# Patient Record
Sex: Male | Born: 1935
Health system: Southern US, Community
[De-identification: ages and names within clinical notes are randomized; demographics above are authoritative.]

## PROBLEM LIST (undated history)

## (undated) DIAGNOSIS — I6529 Occlusion and stenosis of unspecified carotid artery: Secondary | ICD-10-CM

## (undated) DIAGNOSIS — R748 Abnormal levels of other serum enzymes: Secondary | ICD-10-CM

## (undated) DIAGNOSIS — K222 Esophageal obstruction: Secondary | ICD-10-CM

## (undated) DIAGNOSIS — K5730 Diverticulosis of large intestine without perforation or abscess without bleeding: Secondary | ICD-10-CM

## (undated) DIAGNOSIS — M545 Low back pain, unspecified: Secondary | ICD-10-CM

## (undated) DIAGNOSIS — N400 Enlarged prostate without lower urinary tract symptoms: Secondary | ICD-10-CM

## (undated) DIAGNOSIS — K219 Gastro-esophageal reflux disease without esophagitis: Secondary | ICD-10-CM

## (undated) DIAGNOSIS — T827XXA Infection and inflammatory reaction due to other cardiac and vascular devices, implants and grafts, initial encounter: Secondary | ICD-10-CM

## (undated) DIAGNOSIS — E785 Hyperlipidemia, unspecified: Secondary | ICD-10-CM

## (undated) DIAGNOSIS — H906 Mixed conductive and sensorineural hearing loss, bilateral: Secondary | ICD-10-CM

## (undated) DIAGNOSIS — I2510 Atherosclerotic heart disease of native coronary artery without angina pectoris: Secondary | ICD-10-CM

## (undated) DIAGNOSIS — M1990 Unspecified osteoarthritis, unspecified site: Secondary | ICD-10-CM

## (undated) DIAGNOSIS — Z950 Presence of cardiac pacemaker: Secondary | ICD-10-CM

## (undated) DIAGNOSIS — M353 Polymyalgia rheumatica: Secondary | ICD-10-CM

## (undated) DIAGNOSIS — K449 Diaphragmatic hernia without obstruction or gangrene: Secondary | ICD-10-CM

## (undated) DIAGNOSIS — I509 Heart failure, unspecified: Secondary | ICD-10-CM

## (undated) DIAGNOSIS — R972 Elevated prostate specific antigen [PSA]: Secondary | ICD-10-CM

## (undated) DIAGNOSIS — N63 Unspecified lump in unspecified breast: Secondary | ICD-10-CM

## (undated) DIAGNOSIS — I4891 Unspecified atrial fibrillation: Secondary | ICD-10-CM

## (undated) DIAGNOSIS — Z9289 Personal history of other medical treatment: Secondary | ICD-10-CM

## (undated) DIAGNOSIS — I495 Sick sinus syndrome: Secondary | ICD-10-CM

## (undated) DIAGNOSIS — K648 Other hemorrhoids: Secondary | ICD-10-CM

## (undated) HISTORY — DX: Sick sinus syndrome: I49.5

## (undated) HISTORY — DX: Low back pain: M54.5

## (undated) HISTORY — PX: CARDIAC CATHETERIZATION: SHX172

## (undated) HISTORY — DX: Gastro-esophageal reflux disease without esophagitis: K21.9

## (undated) HISTORY — DX: Low back pain, unspecified: M54.50

## (undated) HISTORY — DX: Hyperlipidemia, unspecified: E78.5

## (undated) HISTORY — PX: UMBILICAL HERNIA REPAIR: SHX196

## (undated) HISTORY — PX: HEMORRHOID BANDING: SHX5850

## (undated) HISTORY — PX: INGUINAL HERNIA REPAIR: SUR1180

## (undated) HISTORY — PX: CATARACT EXTRACTION, BILATERAL: SHX1313

## (undated) HISTORY — DX: Diverticulosis of large intestine without perforation or abscess without bleeding: K57.30

## (undated) HISTORY — DX: Benign prostatic hyperplasia without lower urinary tract symptoms: N40.0

## (undated) HISTORY — DX: Occlusion and stenosis of unspecified carotid artery: I65.29

## (undated) HISTORY — DX: Abnormal levels of other serum enzymes: R74.8

## (undated) HISTORY — PX: PARACENTESIS: SHX844

## (undated) HISTORY — DX: Unspecified lump in unspecified breast: N63.0

## (undated) HISTORY — DX: Diaphragmatic hernia without obstruction or gangrene: K44.9

## (undated) HISTORY — DX: Personal history of other medical treatment: Z92.89

## (undated) HISTORY — DX: Atherosclerotic heart disease of native coronary artery without angina pectoris: I25.10

## (undated) HISTORY — DX: Other hemorrhoids: K64.8

## (undated) HISTORY — DX: Esophageal obstruction: K22.2

## (undated) HISTORY — PX: INSERT / REPLACE / REMOVE PACEMAKER: SUR710

## (undated) HISTORY — DX: Heart failure, unspecified: I50.9

## (undated) HISTORY — DX: Elevated prostate specific antigen (PSA): R97.20

## (undated) HISTORY — DX: Mixed conductive and sensorineural hearing loss, bilateral: H90.6

## (undated) HISTORY — PX: CAROTID ENDARTERECTOMY: SUR193

## (undated) HISTORY — PX: ESOPHAGOGASTRODUODENOSCOPY: SHX1529

## (undated) HISTORY — DX: Polymyalgia rheumatica: M35.3

## (undated) HISTORY — DX: Unspecified atrial fibrillation: I48.91

## (undated) SURGERY — CARDIOVERSION
Anesthesia: Monitor Anesthesia Care

---
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
## 1997-06-19 ENCOUNTER — Ambulatory Visit (HOSPITAL_COMMUNITY): Admission: RE | Admit: 1997-06-19 | Discharge: 1997-06-19 | Payer: Self-pay | Admitting: Ophthalmology

## 1997-10-01 ENCOUNTER — Inpatient Hospital Stay (HOSPITAL_COMMUNITY): Admission: EM | Admit: 1997-10-01 | Discharge: 1997-10-04 | Payer: Self-pay | Admitting: Emergency Medicine

## 1999-01-28 ENCOUNTER — Encounter: Payer: Self-pay | Admitting: Gastroenterology

## 1999-01-28 ENCOUNTER — Ambulatory Visit (HOSPITAL_COMMUNITY): Admission: RE | Admit: 1999-01-28 | Discharge: 1999-01-28 | Payer: Self-pay | Admitting: Gastroenterology

## 1999-12-13 ENCOUNTER — Inpatient Hospital Stay (HOSPITAL_COMMUNITY): Admission: EM | Admit: 1999-12-13 | Discharge: 1999-12-16 | Payer: Self-pay | Admitting: Emergency Medicine

## 1999-12-13 ENCOUNTER — Encounter: Payer: Self-pay | Admitting: Cardiology

## 1999-12-14 ENCOUNTER — Encounter: Payer: Self-pay | Admitting: Cardiology

## 2002-08-31 ENCOUNTER — Encounter: Payer: Self-pay | Admitting: Emergency Medicine

## 2002-08-31 ENCOUNTER — Inpatient Hospital Stay (HOSPITAL_COMMUNITY): Admission: EM | Admit: 2002-08-31 | Discharge: 2002-09-03 | Payer: Self-pay | Admitting: Emergency Medicine

## 2003-01-21 ENCOUNTER — Inpatient Hospital Stay (HOSPITAL_COMMUNITY): Admission: EM | Admit: 2003-01-21 | Discharge: 2003-01-23 | Payer: Self-pay | Admitting: *Deleted

## 2003-04-11 ENCOUNTER — Inpatient Hospital Stay (HOSPITAL_COMMUNITY): Admission: AD | Admit: 2003-04-11 | Discharge: 2003-04-14 | Payer: Self-pay | Admitting: Cardiology

## 2003-08-13 ENCOUNTER — Ambulatory Visit (HOSPITAL_COMMUNITY): Admission: RE | Admit: 2003-08-13 | Discharge: 2003-08-13 | Payer: Self-pay | Admitting: Cardiology

## 2003-10-30 ENCOUNTER — Ambulatory Visit (HOSPITAL_COMMUNITY): Admission: RE | Admit: 2003-10-30 | Discharge: 2003-10-30 | Payer: Self-pay | Admitting: Ophthalmology

## 2004-01-29 ENCOUNTER — Ambulatory Visit: Payer: Self-pay | Admitting: Cardiology

## 2004-02-18 ENCOUNTER — Ambulatory Visit: Payer: Self-pay | Admitting: Cardiology

## 2004-02-26 ENCOUNTER — Ambulatory Visit: Payer: Self-pay | Admitting: Cardiology

## 2004-03-13 ENCOUNTER — Ambulatory Visit: Payer: Self-pay | Admitting: Cardiology

## 2004-03-25 ENCOUNTER — Ambulatory Visit: Payer: Self-pay | Admitting: Internal Medicine

## 2004-03-29 ENCOUNTER — Ambulatory Visit: Payer: Self-pay | Admitting: Cardiology

## 2004-04-05 ENCOUNTER — Ambulatory Visit: Payer: Self-pay | Admitting: Internal Medicine

## 2004-04-22 ENCOUNTER — Ambulatory Visit: Payer: Self-pay | Admitting: Cardiology

## 2004-04-29 ENCOUNTER — Ambulatory Visit: Payer: Self-pay | Admitting: Cardiology

## 2004-05-20 ENCOUNTER — Ambulatory Visit: Payer: Self-pay | Admitting: Internal Medicine

## 2004-06-02 ENCOUNTER — Ambulatory Visit: Payer: Self-pay | Admitting: Cardiology

## 2004-06-17 ENCOUNTER — Ambulatory Visit: Payer: Self-pay | Admitting: Cardiology

## 2004-07-02 ENCOUNTER — Ambulatory Visit: Payer: Self-pay | Admitting: Cardiology

## 2004-08-11 ENCOUNTER — Ambulatory Visit: Payer: Self-pay | Admitting: Cardiology

## 2004-08-15 ENCOUNTER — Ambulatory Visit: Payer: Self-pay | Admitting: Cardiology

## 2004-08-25 ENCOUNTER — Ambulatory Visit: Payer: Self-pay

## 2004-09-11 ENCOUNTER — Ambulatory Visit: Payer: Self-pay | Admitting: Cardiology

## 2004-09-11 ENCOUNTER — Ambulatory Visit: Payer: Self-pay | Admitting: Internal Medicine

## 2004-09-29 ENCOUNTER — Ambulatory Visit: Payer: Self-pay | Admitting: Cardiology

## 2004-09-30 ENCOUNTER — Ambulatory Visit: Payer: Self-pay

## 2004-09-30 ENCOUNTER — Ambulatory Visit: Payer: Self-pay | Admitting: Cardiology

## 2004-10-07 ENCOUNTER — Ambulatory Visit: Payer: Self-pay | Admitting: Cardiology

## 2004-10-09 ENCOUNTER — Ambulatory Visit: Payer: Self-pay | Admitting: Cardiology

## 2004-10-27 ENCOUNTER — Ambulatory Visit: Payer: Self-pay | Admitting: Cardiology

## 2004-11-11 ENCOUNTER — Ambulatory Visit: Payer: Self-pay | Admitting: Cardiology

## 2004-11-14 ENCOUNTER — Ambulatory Visit: Payer: Self-pay | Admitting: Cardiology

## 2004-11-17 ENCOUNTER — Ambulatory Visit: Payer: Self-pay | Admitting: Cardiology

## 2004-11-18 ENCOUNTER — Ambulatory Visit: Payer: Self-pay | Admitting: Cardiology

## 2004-11-18 ENCOUNTER — Ambulatory Visit (HOSPITAL_COMMUNITY): Admission: RE | Admit: 2004-11-18 | Discharge: 2004-11-18 | Payer: Self-pay | Admitting: Cardiology

## 2004-11-25 ENCOUNTER — Ambulatory Visit: Payer: Self-pay | Admitting: Cardiology

## 2004-12-09 ENCOUNTER — Ambulatory Visit: Payer: Self-pay | Admitting: *Deleted

## 2004-12-16 ENCOUNTER — Ambulatory Visit: Payer: Self-pay | Admitting: Cardiology

## 2004-12-26 ENCOUNTER — Ambulatory Visit: Payer: Self-pay | Admitting: Cardiovascular Disease

## 2004-12-29 ENCOUNTER — Inpatient Hospital Stay (HOSPITAL_COMMUNITY): Admission: EM | Admit: 2004-12-29 | Discharge: 2005-01-01 | Payer: Self-pay | Admitting: Emergency Medicine

## 2004-12-30 ENCOUNTER — Ambulatory Visit: Payer: Self-pay | Admitting: Cardiovascular Disease

## 2005-01-06 ENCOUNTER — Ambulatory Visit: Payer: Self-pay | Admitting: Cardiology

## 2005-01-09 ENCOUNTER — Ambulatory Visit: Payer: Self-pay | Admitting: Cardiology

## 2005-01-12 ENCOUNTER — Ambulatory Visit: Payer: Self-pay | Admitting: Internal Medicine

## 2005-01-12 ENCOUNTER — Observation Stay (HOSPITAL_COMMUNITY): Admission: EM | Admit: 2005-01-12 | Discharge: 2005-01-13 | Payer: Self-pay | Admitting: Internal Medicine

## 2005-01-13 ENCOUNTER — Encounter: Payer: Self-pay | Admitting: Gastroenterology

## 2005-01-19 ENCOUNTER — Ambulatory Visit: Payer: Self-pay | Admitting: Cardiology

## 2005-01-20 ENCOUNTER — Ambulatory Visit: Payer: Self-pay | Admitting: Cardiology

## 2005-01-23 ENCOUNTER — Ambulatory Visit: Payer: Self-pay | Admitting: Internal Medicine

## 2005-01-27 ENCOUNTER — Ambulatory Visit: Payer: Self-pay | Admitting: Cardiology

## 2005-01-27 ENCOUNTER — Ambulatory Visit: Payer: Self-pay

## 2005-02-05 ENCOUNTER — Ambulatory Visit: Payer: Self-pay | Admitting: Cardiology

## 2005-02-12 ENCOUNTER — Ambulatory Visit: Payer: Self-pay | Admitting: Cardiology

## 2005-02-18 ENCOUNTER — Ambulatory Visit: Payer: Self-pay | Admitting: Cardiology

## 2005-02-25 ENCOUNTER — Ambulatory Visit: Payer: Self-pay | Admitting: Cardiology

## 2005-02-26 ENCOUNTER — Ambulatory Visit: Payer: Self-pay | Admitting: Cardiology

## 2005-03-19 ENCOUNTER — Ambulatory Visit: Payer: Self-pay | Admitting: Cardiology

## 2005-03-19 ENCOUNTER — Ambulatory Visit: Payer: Self-pay | Admitting: Internal Medicine

## 2005-04-08 ENCOUNTER — Ambulatory Visit: Payer: Self-pay | Admitting: Cardiology

## 2005-04-14 ENCOUNTER — Ambulatory Visit (HOSPITAL_COMMUNITY): Admission: RE | Admit: 2005-04-14 | Discharge: 2005-04-14 | Payer: Self-pay | Admitting: Cardiology

## 2005-04-14 ENCOUNTER — Ambulatory Visit: Payer: Self-pay | Admitting: Cardiology

## 2005-04-20 ENCOUNTER — Ambulatory Visit: Payer: Self-pay | Admitting: Internal Medicine

## 2005-04-27 ENCOUNTER — Ambulatory Visit: Payer: Self-pay

## 2005-06-02 ENCOUNTER — Ambulatory Visit: Payer: Self-pay | Admitting: Cardiology

## 2005-06-04 ENCOUNTER — Ambulatory Visit: Payer: Self-pay | Admitting: Cardiology

## 2005-06-22 ENCOUNTER — Ambulatory Visit: Payer: Self-pay | Admitting: Cardiology

## 2005-06-25 ENCOUNTER — Ambulatory Visit: Payer: Self-pay | Admitting: Internal Medicine

## 2005-06-29 ENCOUNTER — Encounter: Admission: RE | Admit: 2005-06-29 | Discharge: 2005-07-01 | Payer: Self-pay | Admitting: Internal Medicine

## 2005-06-30 ENCOUNTER — Ambulatory Visit: Payer: Self-pay | Admitting: Cardiology

## 2005-07-21 ENCOUNTER — Ambulatory Visit: Payer: Self-pay | Admitting: Cardiology

## 2005-07-31 ENCOUNTER — Ambulatory Visit: Payer: Self-pay | Admitting: Cardiology

## 2005-08-11 ENCOUNTER — Ambulatory Visit: Payer: Self-pay | Admitting: Internal Medicine

## 2005-08-25 ENCOUNTER — Ambulatory Visit: Payer: Self-pay | Admitting: Cardiology

## 2005-09-01 ENCOUNTER — Ambulatory Visit: Payer: Self-pay | Admitting: Cardiology

## 2005-09-07 ENCOUNTER — Ambulatory Visit: Payer: Self-pay | Admitting: Cardiology

## 2005-09-07 ENCOUNTER — Ambulatory Visit: Payer: Self-pay | Admitting: Cardiovascular Disease

## 2005-09-24 ENCOUNTER — Ambulatory Visit: Payer: Self-pay | Admitting: Cardiology

## 2005-09-29 ENCOUNTER — Ambulatory Visit: Payer: Self-pay | Admitting: Cardiology

## 2005-10-08 ENCOUNTER — Ambulatory Visit: Payer: Self-pay | Admitting: Cardiology

## 2005-10-22 ENCOUNTER — Ambulatory Visit: Payer: Self-pay | Admitting: Cardiology

## 2005-11-03 ENCOUNTER — Ambulatory Visit: Payer: Self-pay | Admitting: Cardiovascular Disease

## 2005-11-13 ENCOUNTER — Ambulatory Visit: Payer: Self-pay | Admitting: Cardiology

## 2005-12-10 ENCOUNTER — Ambulatory Visit: Payer: Self-pay | Admitting: Cardiology

## 2005-12-24 ENCOUNTER — Ambulatory Visit: Payer: Self-pay | Admitting: Cardiology

## 2006-01-05 ENCOUNTER — Ambulatory Visit: Payer: Self-pay | Admitting: Cardiology

## 2006-01-05 ENCOUNTER — Ambulatory Visit: Payer: Self-pay | Admitting: Internal Medicine

## 2006-01-06 ENCOUNTER — Ambulatory Visit: Payer: Self-pay | Admitting: Internal Medicine

## 2006-01-06 ENCOUNTER — Ambulatory Visit (HOSPITAL_COMMUNITY): Admission: RE | Admit: 2006-01-06 | Discharge: 2006-01-06 | Payer: Self-pay | Admitting: Internal Medicine

## 2006-01-20 ENCOUNTER — Ambulatory Visit: Payer: Self-pay | Admitting: Cardiology

## 2006-01-27 ENCOUNTER — Ambulatory Visit: Payer: Self-pay

## 2006-01-27 ENCOUNTER — Ambulatory Visit: Payer: Self-pay | Admitting: Cardiology

## 2006-01-27 ENCOUNTER — Encounter: Payer: Self-pay | Admitting: Cardiology

## 2006-02-18 ENCOUNTER — Ambulatory Visit: Payer: Self-pay | Admitting: Cardiology

## 2006-03-04 ENCOUNTER — Ambulatory Visit: Payer: Self-pay | Admitting: Cardiology

## 2006-03-08 ENCOUNTER — Ambulatory Visit: Payer: Self-pay | Admitting: Cardiology

## 2006-03-22 ENCOUNTER — Ambulatory Visit: Payer: Self-pay | Admitting: Cardiology

## 2006-04-09 ENCOUNTER — Encounter: Admission: RE | Admit: 2006-04-09 | Discharge: 2006-04-09 | Payer: Self-pay | Admitting: Internal Medicine

## 2006-04-09 ENCOUNTER — Ambulatory Visit: Payer: Self-pay | Admitting: Internal Medicine

## 2006-04-13 ENCOUNTER — Ambulatory Visit: Payer: Self-pay | Admitting: Cardiology

## 2006-04-22 ENCOUNTER — Ambulatory Visit: Payer: Self-pay | Admitting: Cardiology

## 2006-04-22 ENCOUNTER — Ambulatory Visit: Payer: Self-pay | Admitting: Internal Medicine

## 2006-04-22 ENCOUNTER — Encounter: Payer: Self-pay | Admitting: Internal Medicine

## 2006-04-26 ENCOUNTER — Encounter: Payer: Self-pay | Admitting: Cardiology

## 2006-04-26 ENCOUNTER — Ambulatory Visit (HOSPITAL_COMMUNITY): Admission: RE | Admit: 2006-04-26 | Discharge: 2006-04-26 | Payer: Self-pay | Admitting: Cardiology

## 2006-05-11 ENCOUNTER — Ambulatory Visit: Payer: Self-pay | Admitting: Internal Medicine

## 2006-05-25 ENCOUNTER — Ambulatory Visit: Payer: Self-pay | Admitting: Cardiology

## 2006-06-03 ENCOUNTER — Ambulatory Visit: Payer: Self-pay | Admitting: Cardiology

## 2006-06-07 ENCOUNTER — Ambulatory Visit: Payer: Self-pay | Admitting: Cardiovascular Disease

## 2006-06-09 ENCOUNTER — Inpatient Hospital Stay (HOSPITAL_COMMUNITY): Admission: AD | Admit: 2006-06-09 | Discharge: 2006-06-13 | Payer: Self-pay | Admitting: Internal Medicine

## 2006-06-09 ENCOUNTER — Ambulatory Visit: Payer: Self-pay | Admitting: Internal Medicine

## 2006-06-22 ENCOUNTER — Ambulatory Visit: Payer: Self-pay | Admitting: Internal Medicine

## 2006-07-01 ENCOUNTER — Ambulatory Visit: Payer: Self-pay | Admitting: Cardiology

## 2006-07-29 ENCOUNTER — Ambulatory Visit: Payer: Self-pay | Admitting: Internal Medicine

## 2006-08-11 ENCOUNTER — Ambulatory Visit: Payer: Self-pay | Admitting: Internal Medicine

## 2006-08-26 ENCOUNTER — Ambulatory Visit: Payer: Self-pay | Admitting: Cardiology

## 2006-08-26 LAB — CONVERTED CEMR LAB
Albumin: 3.7 g/dL (ref 3.5–5.2)
HDL: 30.5 mg/dL — ABNORMAL LOW (ref >39.0)
Total CHOL/HDL Ratio: 4.5
Triglycerides: 118 mg/dL (ref 0–149)

## 2006-09-02 ENCOUNTER — Ambulatory Visit: Payer: Self-pay | Admitting: Cardiology

## 2006-10-05 ENCOUNTER — Ambulatory Visit: Payer: Self-pay | Admitting: Cardiology

## 2006-11-18 ENCOUNTER — Ambulatory Visit: Payer: Self-pay | Admitting: Cardiology

## 2006-11-29 ENCOUNTER — Ambulatory Visit: Payer: Self-pay | Admitting: Internal Medicine

## 2006-11-29 DIAGNOSIS — N63 Unspecified lump in unspecified breast: Secondary | ICD-10-CM | POA: Insufficient documentation

## 2006-12-02 ENCOUNTER — Encounter: Payer: Self-pay | Admitting: Internal Medicine

## 2006-12-13 ENCOUNTER — Ambulatory Visit: Payer: Self-pay | Admitting: Cardiology

## 2006-12-13 ENCOUNTER — Ambulatory Visit: Payer: Self-pay | Admitting: Internal Medicine

## 2006-12-17 ENCOUNTER — Telehealth (INDEPENDENT_AMBULATORY_CARE_PROVIDER_SITE_OTHER): Payer: Self-pay | Admitting: *Deleted

## 2007-01-04 ENCOUNTER — Telehealth: Payer: Self-pay | Admitting: Internal Medicine

## 2007-01-07 ENCOUNTER — Ambulatory Visit: Payer: Self-pay | Admitting: Internal Medicine

## 2007-01-11 ENCOUNTER — Ambulatory Visit: Payer: Self-pay | Admitting: Cardiology

## 2007-01-14 ENCOUNTER — Telehealth: Payer: Self-pay | Admitting: Internal Medicine

## 2007-02-08 ENCOUNTER — Ambulatory Visit: Payer: Self-pay | Admitting: Cardiology

## 2007-02-09 ENCOUNTER — Telehealth: Payer: Self-pay | Admitting: Internal Medicine

## 2007-02-14 ENCOUNTER — Telehealth: Payer: Self-pay | Admitting: Internal Medicine

## 2007-03-01 ENCOUNTER — Ambulatory Visit: Payer: Self-pay | Admitting: Cardiology

## 2007-03-15 ENCOUNTER — Ambulatory Visit: Payer: Self-pay | Admitting: Internal Medicine

## 2007-03-15 DIAGNOSIS — M545 Low back pain: Secondary | ICD-10-CM

## 2007-03-16 DIAGNOSIS — K219 Gastro-esophageal reflux disease without esophagitis: Secondary | ICD-10-CM

## 2007-03-16 DIAGNOSIS — N400 Enlarged prostate without lower urinary tract symptoms: Secondary | ICD-10-CM

## 2007-03-16 DIAGNOSIS — E785 Hyperlipidemia, unspecified: Secondary | ICD-10-CM | POA: Insufficient documentation

## 2007-03-16 DIAGNOSIS — I4891 Unspecified atrial fibrillation: Secondary | ICD-10-CM

## 2007-03-31 ENCOUNTER — Ambulatory Visit: Payer: Self-pay | Admitting: Cardiology

## 2007-04-11 ENCOUNTER — Encounter: Payer: Self-pay | Admitting: Internal Medicine

## 2007-04-28 ENCOUNTER — Ambulatory Visit: Payer: Self-pay | Admitting: Cardiology

## 2007-05-18 ENCOUNTER — Ambulatory Visit: Payer: Self-pay | Admitting: Cardiology

## 2007-05-25 ENCOUNTER — Ambulatory Visit: Payer: Self-pay | Admitting: Cardiology

## 2007-06-13 ENCOUNTER — Ambulatory Visit: Payer: Self-pay | Admitting: Internal Medicine

## 2007-06-28 ENCOUNTER — Ambulatory Visit: Payer: Self-pay | Admitting: Cardiology

## 2007-06-28 LAB — CONVERTED CEMR LAB
Albumin: 3.7 g/dL (ref 3.5–5.2)
BUN: 19 mg/dL (ref 6–23)
Cholesterol: 134 mg/dL (ref 0–200)
Creatinine, Ser: 0.8 mg/dL (ref 0.4–1.5)
GFR calc Af Amer: 121 mL/min
GFR calc non Af Amer: 100 mL/min
LDL Cholesterol: 83 mg/dL (ref 0–99)
Potassium: 4 meq/L (ref 3.5–5.1)
Triglycerides: 96 mg/dL (ref 0–149)
VLDL: 19 mg/dL (ref 0–40)

## 2007-07-04 ENCOUNTER — Ambulatory Visit: Payer: Self-pay | Admitting: Cardiovascular Disease

## 2007-07-06 ENCOUNTER — Telehealth: Payer: Self-pay | Admitting: Internal Medicine

## 2007-08-08 ENCOUNTER — Ambulatory Visit: Payer: Self-pay | Admitting: Cardiovascular Disease

## 2007-08-22 ENCOUNTER — Telehealth: Payer: Self-pay | Admitting: Internal Medicine

## 2007-08-30 ENCOUNTER — Ambulatory Visit: Payer: Self-pay | Admitting: Internal Medicine

## 2007-09-05 ENCOUNTER — Ambulatory Visit: Payer: Self-pay | Admitting: Cardiology

## 2007-09-15 ENCOUNTER — Ambulatory Visit: Payer: Self-pay | Admitting: Cardiology

## 2007-10-21 ENCOUNTER — Ambulatory Visit: Payer: Self-pay | Admitting: Cardiology

## 2007-10-27 ENCOUNTER — Ambulatory Visit: Payer: Self-pay | Admitting: Cardiology

## 2007-11-11 ENCOUNTER — Ambulatory Visit: Payer: Self-pay | Admitting: Cardiovascular Disease

## 2007-11-29 ENCOUNTER — Ambulatory Visit: Payer: Self-pay | Admitting: Internal Medicine

## 2007-12-08 ENCOUNTER — Ambulatory Visit: Payer: Self-pay | Admitting: Internal Medicine

## 2008-01-05 ENCOUNTER — Ambulatory Visit: Payer: Self-pay | Admitting: Internal Medicine

## 2008-01-10 ENCOUNTER — Ambulatory Visit: Payer: Self-pay | Admitting: Internal Medicine

## 2008-01-10 DIAGNOSIS — R972 Elevated prostate specific antigen [PSA]: Secondary | ICD-10-CM | POA: Insufficient documentation

## 2008-01-10 DIAGNOSIS — H906 Mixed conductive and sensorineural hearing loss, bilateral: Secondary | ICD-10-CM | POA: Insufficient documentation

## 2008-01-16 ENCOUNTER — Ambulatory Visit: Payer: Self-pay | Admitting: Cardiology

## 2008-01-16 ENCOUNTER — Ambulatory Visit: Payer: Self-pay | Admitting: Internal Medicine

## 2008-01-16 LAB — CONVERTED CEMR LAB
Albumin: 3.7 g/dL (ref 3.5–5.2)
Alkaline Phosphatase: 49 units/L (ref 39–117)
BUN: 21 mg/dL (ref 6–23)
Cholesterol: 116 mg/dL (ref 0–200)
GFR calc non Af Amer: 100 mL/min
Glucose, Bld: 97 mg/dL (ref 70–99)
HDL: 26 mg/dL — ABNORMAL LOW (ref >39.0)
Total Bilirubin: 1 mg/dL (ref 0.3–1.2)
Triglycerides: 81 mg/dL (ref 0–149)
VLDL: 16 mg/dL (ref 0–40)

## 2008-01-18 ENCOUNTER — Encounter: Payer: Self-pay | Admitting: Internal Medicine

## 2008-01-18 ENCOUNTER — Encounter: Admission: RE | Admit: 2008-01-18 | Discharge: 2008-02-13 | Payer: Self-pay | Admitting: Internal Medicine

## 2008-01-20 ENCOUNTER — Ambulatory Visit: Payer: Self-pay | Admitting: Cardiology

## 2008-01-26 ENCOUNTER — Ambulatory Visit: Payer: Self-pay | Admitting: Cardiology

## 2008-02-01 ENCOUNTER — Ambulatory Visit: Payer: Self-pay | Admitting: Cardiology

## 2008-02-07 ENCOUNTER — Encounter (INDEPENDENT_AMBULATORY_CARE_PROVIDER_SITE_OTHER): Payer: Self-pay | Admitting: *Deleted

## 2008-02-07 ENCOUNTER — Ambulatory Visit: Payer: Self-pay | Admitting: Internal Medicine

## 2008-02-13 ENCOUNTER — Encounter: Payer: Self-pay | Admitting: Internal Medicine

## 2008-02-14 ENCOUNTER — Ambulatory Visit: Payer: Self-pay | Admitting: Internal Medicine

## 2008-02-14 ENCOUNTER — Ambulatory Visit (HOSPITAL_COMMUNITY): Admission: RE | Admit: 2008-02-14 | Discharge: 2008-02-14 | Payer: Self-pay | Admitting: Internal Medicine

## 2008-02-20 ENCOUNTER — Telehealth (INDEPENDENT_AMBULATORY_CARE_PROVIDER_SITE_OTHER): Payer: Self-pay | Admitting: *Deleted

## 2008-02-21 ENCOUNTER — Ambulatory Visit: Payer: Self-pay | Admitting: Internal Medicine

## 2008-03-06 ENCOUNTER — Ambulatory Visit: Payer: Self-pay | Admitting: Cardiology

## 2008-03-14 ENCOUNTER — Telehealth: Payer: Self-pay | Admitting: Internal Medicine

## 2008-03-15 ENCOUNTER — Telehealth: Payer: Self-pay | Admitting: Internal Medicine

## 2008-04-03 ENCOUNTER — Ambulatory Visit: Payer: Self-pay | Admitting: Cardiology

## 2008-05-01 ENCOUNTER — Ambulatory Visit: Payer: Self-pay | Admitting: Cardiovascular Disease

## 2008-05-08 ENCOUNTER — Encounter: Payer: Self-pay | Admitting: Internal Medicine

## 2008-05-08 ENCOUNTER — Ambulatory Visit: Payer: Self-pay | Admitting: Internal Medicine

## 2008-05-09 ENCOUNTER — Ambulatory Visit (HOSPITAL_COMMUNITY): Admission: RE | Admit: 2008-05-09 | Discharge: 2008-05-09 | Payer: Self-pay | Admitting: Internal Medicine

## 2008-05-09 ENCOUNTER — Ambulatory Visit: Payer: Self-pay | Admitting: Internal Medicine

## 2008-05-17 ENCOUNTER — Ambulatory Visit: Payer: Self-pay | Admitting: Cardiology

## 2008-05-22 ENCOUNTER — Ambulatory Visit: Payer: Self-pay | Admitting: Internal Medicine

## 2008-05-29 ENCOUNTER — Ambulatory Visit: Payer: Self-pay | Admitting: Cardiovascular Disease

## 2008-06-04 ENCOUNTER — Telehealth: Payer: Self-pay | Admitting: Internal Medicine

## 2008-06-11 ENCOUNTER — Telehealth: Payer: Self-pay | Admitting: Internal Medicine

## 2008-06-18 ENCOUNTER — Ambulatory Visit: Payer: Self-pay | Admitting: Internal Medicine

## 2008-06-22 ENCOUNTER — Telehealth: Payer: Self-pay | Admitting: Internal Medicine

## 2008-06-26 ENCOUNTER — Ambulatory Visit: Payer: Self-pay | Admitting: Cardiology

## 2008-06-29 ENCOUNTER — Encounter (INDEPENDENT_AMBULATORY_CARE_PROVIDER_SITE_OTHER): Payer: Self-pay | Admitting: Radiology

## 2008-07-03 ENCOUNTER — Ambulatory Visit: Payer: Self-pay | Admitting: Internal Medicine

## 2008-07-03 ENCOUNTER — Telehealth (INDEPENDENT_AMBULATORY_CARE_PROVIDER_SITE_OTHER): Payer: Self-pay | Admitting: *Deleted

## 2008-07-03 DIAGNOSIS — Z8739 Personal history of other diseases of the musculoskeletal system and connective tissue: Secondary | ICD-10-CM

## 2008-07-20 ENCOUNTER — Ambulatory Visit: Payer: Self-pay | Admitting: Cardiology

## 2008-07-20 DIAGNOSIS — I495 Sick sinus syndrome: Secondary | ICD-10-CM

## 2008-07-20 DIAGNOSIS — I2510 Atherosclerotic heart disease of native coronary artery without angina pectoris: Secondary | ICD-10-CM | POA: Insufficient documentation

## 2008-07-23 ENCOUNTER — Ambulatory Visit: Payer: Self-pay | Admitting: Cardiology

## 2008-07-24 ENCOUNTER — Ambulatory Visit: Payer: Self-pay | Admitting: Cardiovascular Disease

## 2008-08-15 ENCOUNTER — Encounter (INDEPENDENT_AMBULATORY_CARE_PROVIDER_SITE_OTHER): Payer: Self-pay | Admitting: *Deleted

## 2008-08-15 ENCOUNTER — Ambulatory Visit: Payer: Self-pay | Admitting: Internal Medicine

## 2008-08-16 LAB — CONVERTED CEMR LAB
ALT: 27 units/L (ref 0–53)
AST: 32 units/L (ref 0–37)
Calcium: 8.9 mg/dL (ref 8.4–10.5)
Creatinine, Ser: 0.9 mg/dL (ref 0.4–1.5)
GFR calc non Af Amer: 86.91 mL/min (ref >60)
Glucose, Bld: 152 mg/dL — ABNORMAL HIGH (ref 70–99)
HDL: 41.4 mg/dL (ref >39.00)
Sodium: 142 meq/L (ref 135–145)
Total Bilirubin: 1 mg/dL (ref 0.3–1.2)
Triglycerides: 135 mg/dL (ref 0.0–149.0)

## 2008-08-21 ENCOUNTER — Encounter (INDEPENDENT_AMBULATORY_CARE_PROVIDER_SITE_OTHER): Payer: Self-pay | Admitting: Cardiology

## 2008-08-21 ENCOUNTER — Ambulatory Visit: Payer: Self-pay | Admitting: Internal Medicine

## 2008-08-21 LAB — CONVERTED CEMR LAB: POC INR: 3.3

## 2008-09-12 ENCOUNTER — Encounter: Payer: Self-pay | Admitting: *Deleted

## 2008-09-18 ENCOUNTER — Ambulatory Visit: Payer: Self-pay | Admitting: Cardiovascular Disease

## 2008-09-18 ENCOUNTER — Encounter (INDEPENDENT_AMBULATORY_CARE_PROVIDER_SITE_OTHER): Payer: Self-pay | Admitting: Cardiology

## 2008-10-16 ENCOUNTER — Ambulatory Visit: Payer: Self-pay | Admitting: Internal Medicine

## 2008-10-16 LAB — CONVERTED CEMR LAB
POC INR: 3.9
Prothrombin Time: 23.8 s

## 2008-10-19 ENCOUNTER — Ambulatory Visit: Payer: Self-pay | Admitting: Cardiology

## 2008-10-19 ENCOUNTER — Encounter: Payer: Self-pay | Admitting: Internal Medicine

## 2008-10-30 ENCOUNTER — Ambulatory Visit: Payer: Self-pay | Admitting: Cardiovascular Disease

## 2008-10-30 ENCOUNTER — Ambulatory Visit: Payer: Self-pay | Admitting: Cardiology

## 2008-10-30 LAB — CONVERTED CEMR LAB: POC INR: 3.1

## 2008-11-07 ENCOUNTER — Telehealth: Payer: Self-pay | Admitting: Internal Medicine

## 2008-11-19 ENCOUNTER — Ambulatory Visit: Payer: Self-pay | Admitting: Internal Medicine

## 2008-11-20 ENCOUNTER — Ambulatory Visit: Payer: Self-pay | Admitting: Cardiology

## 2008-11-20 LAB — CONVERTED CEMR LAB: POC INR: 2.4

## 2008-11-27 ENCOUNTER — Encounter: Payer: Self-pay | Admitting: Internal Medicine

## 2008-11-27 DIAGNOSIS — Z950 Presence of cardiac pacemaker: Secondary | ICD-10-CM

## 2008-12-17 ENCOUNTER — Ambulatory Visit: Payer: Self-pay | Admitting: Internal Medicine

## 2008-12-17 LAB — CONVERTED CEMR LAB: POC INR: 3.1

## 2008-12-19 ENCOUNTER — Telehealth: Payer: Self-pay | Admitting: Internal Medicine

## 2009-01-07 ENCOUNTER — Ambulatory Visit: Payer: Self-pay | Admitting: Internal Medicine

## 2009-01-07 LAB — CONVERTED CEMR LAB
AST: 29 units/L (ref 0–37)
Alkaline Phosphatase: 49 units/L (ref 39–117)
Bilirubin, Direct: 0.2 mg/dL (ref 0.0–0.3)
Total Bilirubin: 0.9 mg/dL (ref 0.3–1.2)
Total CHOL/HDL Ratio: 4
Triglycerides: 132 mg/dL (ref 0.0–149.0)

## 2009-01-18 ENCOUNTER — Ambulatory Visit: Payer: Self-pay | Admitting: Cardiology

## 2009-01-18 ENCOUNTER — Encounter: Payer: Self-pay | Admitting: Internal Medicine

## 2009-01-28 ENCOUNTER — Ambulatory Visit: Payer: Self-pay | Admitting: Internal Medicine

## 2009-02-01 ENCOUNTER — Telehealth: Payer: Self-pay | Admitting: Cardiology

## 2009-02-08 ENCOUNTER — Ambulatory Visit: Payer: Self-pay | Admitting: Cardiovascular Disease

## 2009-02-20 ENCOUNTER — Ambulatory Visit: Payer: Self-pay | Admitting: Cardiology

## 2009-02-20 ENCOUNTER — Ambulatory Visit: Payer: Self-pay | Admitting: Internal Medicine

## 2009-02-20 LAB — CONVERTED CEMR LAB: POC INR: 2.4

## 2009-03-05 ENCOUNTER — Ambulatory Visit: Payer: Self-pay | Admitting: Internal Medicine

## 2009-03-13 ENCOUNTER — Telehealth: Payer: Self-pay | Admitting: Internal Medicine

## 2009-03-14 ENCOUNTER — Ambulatory Visit: Payer: Self-pay | Admitting: Internal Medicine

## 2009-03-20 DIAGNOSIS — M4800 Spinal stenosis, site unspecified: Secondary | ICD-10-CM

## 2009-03-22 ENCOUNTER — Ambulatory Visit: Payer: Self-pay | Admitting: Cardiology

## 2009-03-22 LAB — CONVERTED CEMR LAB: POC INR: 1.4

## 2009-03-25 ENCOUNTER — Encounter: Payer: Self-pay | Admitting: Internal Medicine

## 2009-04-05 ENCOUNTER — Ambulatory Visit: Payer: Self-pay | Admitting: Internal Medicine

## 2009-04-05 LAB — CONVERTED CEMR LAB: POC INR: 2.9

## 2009-04-26 ENCOUNTER — Ambulatory Visit: Payer: Self-pay | Admitting: Cardiology

## 2009-04-26 LAB — CONVERTED CEMR LAB: POC INR: 2.4

## 2009-05-13 ENCOUNTER — Telehealth: Payer: Self-pay | Admitting: Internal Medicine

## 2009-05-16 ENCOUNTER — Telehealth: Payer: Self-pay | Admitting: Internal Medicine

## 2009-05-27 ENCOUNTER — Ambulatory Visit: Payer: Self-pay | Admitting: Cardiology

## 2009-05-27 ENCOUNTER — Encounter: Payer: Self-pay | Admitting: Internal Medicine

## 2009-05-27 ENCOUNTER — Ambulatory Visit: Payer: Self-pay

## 2009-05-27 LAB — CONVERTED CEMR LAB: POC INR: 1.9

## 2009-06-11 ENCOUNTER — Ambulatory Visit: Payer: Self-pay | Admitting: Internal Medicine

## 2009-06-17 ENCOUNTER — Ambulatory Visit: Payer: Self-pay | Admitting: Cardiovascular Disease

## 2009-06-17 LAB — CONVERTED CEMR LAB: POC INR: 2.3

## 2009-07-11 ENCOUNTER — Ambulatory Visit: Payer: Self-pay | Admitting: Internal Medicine

## 2009-07-28 ENCOUNTER — Ambulatory Visit: Payer: Self-pay | Admitting: Internal Medicine

## 2009-08-01 ENCOUNTER — Ambulatory Visit: Payer: Self-pay | Admitting: Internal Medicine

## 2009-08-08 ENCOUNTER — Ambulatory Visit: Payer: Self-pay | Admitting: Internal Medicine

## 2009-08-08 DIAGNOSIS — M549 Dorsalgia, unspecified: Secondary | ICD-10-CM | POA: Insufficient documentation

## 2009-08-12 ENCOUNTER — Ambulatory Visit: Payer: Self-pay | Admitting: Cardiology

## 2009-08-29 ENCOUNTER — Ambulatory Visit: Payer: Self-pay | Admitting: Cardiology

## 2009-08-30 ENCOUNTER — Encounter: Payer: Self-pay | Admitting: Internal Medicine

## 2009-09-16 ENCOUNTER — Ambulatory Visit: Payer: Self-pay | Admitting: Internal Medicine

## 2009-09-26 ENCOUNTER — Ambulatory Visit: Payer: Self-pay | Admitting: Cardiology

## 2009-09-26 LAB — CONVERTED CEMR LAB: POC INR: 2.2

## 2009-10-29 ENCOUNTER — Ambulatory Visit: Payer: Self-pay | Admitting: Cardiovascular Disease

## 2009-10-29 ENCOUNTER — Ambulatory Visit: Payer: Self-pay | Admitting: Internal Medicine

## 2009-10-29 LAB — CONVERTED CEMR LAB: POC INR: 2.5

## 2009-11-26 ENCOUNTER — Ambulatory Visit: Payer: Self-pay | Admitting: Cardiology

## 2009-11-26 LAB — CONVERTED CEMR LAB: POC INR: 2.3

## 2009-11-27 ENCOUNTER — Ambulatory Visit: Payer: Self-pay | Admitting: Cardiology

## 2009-12-03 ENCOUNTER — Encounter: Payer: Self-pay | Admitting: Cardiology

## 2009-12-03 ENCOUNTER — Ambulatory Visit: Payer: Self-pay | Admitting: Cardiology

## 2009-12-03 LAB — CONVERTED CEMR LAB
ALT: 23 units/L (ref 0–53)
Bilirubin, Direct: 0.2 mg/dL (ref 0.0–0.3)
Cholesterol: 130 mg/dL (ref 0–200)
LDL Cholesterol: 75 mg/dL (ref 0–99)
Total Bilirubin: 1.1 mg/dL (ref 0.3–1.2)
Total Protein: 6.4 g/dL (ref 6.0–8.3)
Triglycerides: 108 mg/dL (ref 0.0–149.0)

## 2009-12-09 ENCOUNTER — Ambulatory Visit: Payer: Self-pay

## 2009-12-09 ENCOUNTER — Ambulatory Visit: Payer: Self-pay | Admitting: Cardiology

## 2009-12-09 ENCOUNTER — Ambulatory Visit (HOSPITAL_COMMUNITY): Admission: RE | Admit: 2009-12-09 | Discharge: 2009-12-09 | Payer: Self-pay | Admitting: Cardiology

## 2009-12-09 ENCOUNTER — Encounter: Payer: Self-pay | Admitting: Cardiology

## 2009-12-11 LAB — CONVERTED CEMR LAB
BUN: 18 mg/dL (ref 6–23)
Basophils Relative: 0.3 % (ref 0.0–3.0)
Chloride: 101 meq/L (ref 96–112)
Eosinophils Relative: 2.9 % (ref 0.0–5.0)
GFR calc non Af Amer: 91.27 mL/min (ref >60)
Glucose, Bld: 101 mg/dL — ABNORMAL HIGH (ref 70–99)
HCT: 43.5 % (ref 39.0–52.0)
Hemoglobin: 15.4 g/dL (ref 13.0–17.0)
Lymphs Abs: 2.1 10*3/uL (ref 0.7–4.0)
MCV: 90.4 fL (ref 78.0–100.0)
Monocytes Absolute: 0.8 10*3/uL (ref 0.1–1.0)
Monocytes Relative: 7.3 % (ref 3.0–12.0)
Neutro Abs: 7.2 10*3/uL (ref 1.4–7.7)
Platelets: 238 10*3/uL (ref 150.0–400.0)
Potassium: 4.5 meq/L (ref 3.5–5.1)
Sodium: 136 meq/L (ref 135–145)
WBC: 10.4 10*3/uL (ref 4.5–10.5)

## 2009-12-13 ENCOUNTER — Telehealth: Payer: Self-pay | Admitting: Cardiology

## 2009-12-16 ENCOUNTER — Ambulatory Visit: Payer: Self-pay | Admitting: Internal Medicine

## 2009-12-23 ENCOUNTER — Telehealth: Payer: Self-pay | Admitting: Cardiology

## 2009-12-23 ENCOUNTER — Encounter: Payer: Self-pay | Admitting: Cardiology

## 2009-12-23 ENCOUNTER — Encounter: Payer: Self-pay | Admitting: Internal Medicine

## 2009-12-23 ENCOUNTER — Ambulatory Visit: Payer: Self-pay | Admitting: Cardiology

## 2009-12-24 ENCOUNTER — Telehealth: Payer: Self-pay | Admitting: Cardiology

## 2009-12-25 LAB — CONVERTED CEMR LAB
BUN: 18 mg/dL (ref 6–23)
CO2: 27 meq/L (ref 19–32)
Chloride: 105 meq/L (ref 96–112)
Creatinine, Ser: 1 mg/dL (ref 0.4–1.5)
Eosinophils Absolute: 0.3 10*3/uL (ref 0.0–0.7)
Eosinophils Relative: 2.4 % (ref 0.0–5.0)
Glucose, Bld: 103 mg/dL — ABNORMAL HIGH (ref 70–99)
HCT: 44.7 % (ref 39.0–52.0)
INR: 1.7 — ABNORMAL HIGH (ref 0.8–1.0)
Lymphs Abs: 2.7 10*3/uL (ref 0.7–4.0)
MCHC: 33.7 g/dL (ref 30.0–36.0)
MCV: 92.9 fL (ref 78.0–100.0)
Monocytes Absolute: 0.9 10*3/uL (ref 0.1–1.0)
Neutrophils Relative %: 63 % (ref 43.0–77.0)
Platelets: 240 10*3/uL (ref 150.0–400.0)
Potassium: 4.8 meq/L (ref 3.5–5.1)
Prothrombin Time: 18 s — ABNORMAL HIGH (ref 9.7–11.8)
RDW: 13.4 % (ref 11.5–14.6)
WBC: 10.6 10*3/uL — ABNORMAL HIGH (ref 4.5–10.5)

## 2009-12-26 ENCOUNTER — Ambulatory Visit: Payer: Self-pay | Admitting: Cardiovascular Disease

## 2009-12-26 ENCOUNTER — Encounter: Payer: Self-pay | Admitting: Cardiology

## 2009-12-27 ENCOUNTER — Encounter: Payer: Self-pay | Admitting: Cardiovascular Disease

## 2009-12-27 ENCOUNTER — Ambulatory Visit (HOSPITAL_COMMUNITY)
Admission: RE | Admit: 2009-12-27 | Discharge: 2009-12-27 | Payer: Self-pay | Source: Home / Self Care | Admitting: Cardiovascular Disease

## 2009-12-27 ENCOUNTER — Ambulatory Visit: Payer: Self-pay | Admitting: Cardiovascular Disease

## 2010-01-08 ENCOUNTER — Ambulatory Visit: Payer: Self-pay | Admitting: Cardiology

## 2010-01-08 ENCOUNTER — Encounter: Payer: Self-pay | Admitting: Internal Medicine

## 2010-01-08 ENCOUNTER — Encounter: Payer: Self-pay | Admitting: Cardiology

## 2010-01-08 LAB — CONVERTED CEMR LAB: POC INR: 2.6

## 2010-02-05 ENCOUNTER — Ambulatory Visit: Payer: Self-pay | Admitting: Cardiology

## 2010-02-13 ENCOUNTER — Ambulatory Visit: Payer: Self-pay | Admitting: Internal Medicine

## 2010-02-18 ENCOUNTER — Encounter: Payer: Self-pay | Admitting: Cardiology

## 2010-02-18 ENCOUNTER — Ambulatory Visit: Payer: Self-pay | Admitting: Cardiology

## 2010-02-26 ENCOUNTER — Telehealth: Payer: Self-pay | Admitting: Internal Medicine

## 2010-03-05 ENCOUNTER — Ambulatory Visit: Payer: Self-pay | Admitting: Cardiology

## 2010-03-13 ENCOUNTER — Telehealth: Payer: Self-pay | Admitting: Internal Medicine

## 2010-03-20 ENCOUNTER — Telehealth: Payer: Self-pay | Admitting: Internal Medicine

## 2010-03-30 ENCOUNTER — Encounter: Payer: Self-pay | Admitting: Internal Medicine

## 2010-04-02 ENCOUNTER — Ambulatory Visit: Admission: RE | Admit: 2010-04-02 | Discharge: 2010-04-02 | Payer: Self-pay | Source: Home / Self Care

## 2010-04-06 LAB — CONVERTED CEMR LAB
CO2: 30 meq/L (ref 19–32)
Chloride: 103 meq/L (ref 96–112)
Digitoxin Lvl: 0.5 ng/mL — ABNORMAL LOW (ref 0.8–2.0)
Glucose, Bld: 118 mg/dL — ABNORMAL HIGH (ref 70–99)
Potassium: 4.8 meq/L (ref 3.5–5.1)
Sodium: 138 meq/L (ref 135–145)
Total CK: 102 units/L (ref 7–232)

## 2010-04-07 ENCOUNTER — Other Ambulatory Visit: Payer: Self-pay | Admitting: Physician Assistant

## 2010-04-07 ENCOUNTER — Ambulatory Visit
Admission: RE | Admit: 2010-04-07 | Discharge: 2010-04-07 | Payer: Self-pay | Source: Home / Self Care | Attending: Cardiology | Admitting: Cardiology

## 2010-04-07 ENCOUNTER — Encounter: Payer: Self-pay | Admitting: Physician Assistant

## 2010-04-07 ENCOUNTER — Encounter: Payer: Self-pay | Admitting: Cardiology

## 2010-04-07 ENCOUNTER — Telehealth: Payer: Self-pay | Admitting: Cardiology

## 2010-04-07 LAB — CBC WITH DIFFERENTIAL/PLATELET
Basophils Relative: 0.4 % (ref 0.0–3.0)
Eosinophils Relative: 3.3 % (ref 0.0–5.0)
Hemoglobin: 14.8 g/dL (ref 13.0–17.0)
Lymphocytes Relative: 31.1 % (ref 12.0–46.0)
Neutro Abs: 6.1 10*3/uL (ref 1.4–7.7)
Neutrophils Relative %: 57.4 % (ref 43.0–77.0)
RBC: 4.76 Mil/uL (ref 4.22–5.81)
WBC: 10.6 10*3/uL — ABNORMAL HIGH (ref 4.5–10.5)

## 2010-04-07 LAB — BASIC METABOLIC PANEL
Calcium: 8.9 mg/dL (ref 8.4–10.5)
Chloride: 101 mEq/L (ref 96–112)
Creatinine, Ser: 0.9 mg/dL (ref 0.4–1.5)
Sodium: 136 mEq/L (ref 135–145)

## 2010-04-08 ENCOUNTER — Ambulatory Visit (HOSPITAL_COMMUNITY)
Admission: RE | Admit: 2010-04-08 | Discharge: 2010-04-08 | Payer: Self-pay | Source: Home / Self Care | Attending: Cardiology | Admitting: Cardiology

## 2010-04-08 ENCOUNTER — Telehealth: Payer: Self-pay | Admitting: Cardiology

## 2010-04-08 NOTE — Medication Information (Signed)
Summary: rov/ewj  Anticoagulant Therapy  Managed by: Weston Brass, PharmD Referring MD: [name] MD, Maisie Fus PCP: Birdie Sons, MD Supervising MD: Daleen Squibb MD, Maisie Fus Indication 1: Atrial Fibrillation Lab Used: [facility] [place] Site: [address] INR POC 2.3 INR RANGE 2-3  Dietary changes: no    Health status changes: no    Bleeding/hemorrhagic complications: no    Recent/future hospitalizations: no    Any changes in medication regimen? no    Recent/future dental: no  Any missed doses?: no       Is patient compliant with meds? yes       Allergies: 1)  ! * Antihistamines  Anticoagulation Management History:      The patient is taking warfarin and comes in today for a routine follow up visit.  Positive risk factors for bleeding include an age of 14 years or older.  The bleeding index is 'intermediate risk'.  Positive CHADS2 values include Age > 19 years old.  Anticoagulation responsible provider: Daleen Squibb MD, Maisie Fus.  INR POC: 2.3.  Cuvette Lot#: [id number].  Exp: 12/2010.    Anticoagulation Management Assessment/Plan:      The patient's current anticoagulation dose is Coumadin 5 mg tabs: As directed.  The target INR is 2 - 3.  The next INR is due 12/24/2009.  Anticoagulation instructions were given to patient.  Results were reviewed/authorized by Weston Brass, PharmD.  He was notified by Kennieth Francois.         Prior Anticoagulation Instructions: INR 2.5  Continue on same dosage 1 tablet daily except 1/2 tablet on Sundays, Tuesdays, and Thursdays.  Recheck in 4 weeks.    Current Anticoagulation Instructions: INR 2.3  Continue taking one tablet every day except for one-half tablet on Sunday, Tuesday, and Thursday.  Recheck in four weeks.

## 2010-04-08 NOTE — Procedures (Signed)
Summary: [employer] Jude f/u per check out /ob   Current Medications (verified): 1)  Coreg 12.5 Mg Tabs (Carvedilol) .... Take 1 Tablet By Mouth Two Times A Day 2)  Coumadin 5 Mg Tabs (Warfarin Sodium) .... As Directed 3)  Tikosyn 500 Mcg Caps (Dofetilide) .... Take 1 Tablet By Mouth Two Times A Day 4)  Digoxin 0.25 Mg Tabs (Digoxin) .... Take 1 Tablet By Mouth Once A Day 5)  Multivitamins   Tabs (Multiple Vitamin) .... Once Daily 6)  Pantoprazole Sodium 40 Mg  Tbec (Pantoprazole Sodium) .... Take 1 Tablet By Mouth Once A Day 7)  Simvastatin 40 Mg Tabs (Simvastatin) .... Take One Tablet At Bedtime 8)  Prednisone 5 Mg Tabs (Prednisone) .... Take 1 and 1/2 Tabs By Mouth Once Daily 9)  Finasteride 5 Mg Tabs (Finasteride) .... Take 1 Tablet By Mouth Once A Day  Allergies (verified): 1)  ! * Antihistamines   PPM Specifications Following MD:  [name], MD     PPM Vendor:  [employer] Jude     PPM Model Number:  [phone]     PPM Serial Number:  [id number] PPM DOI:  04/14/2005     PPM Implanting MD:  [name], MD  Lead 1    Location: [facility]     DOI: 09/12/1996     Model #: [id number]     Serial #: [id number]     Status: active Lead 2    Location: RV     DOI: 09/12/1996     Model #: 1346T     Serial #: [id number]     Status: active   Indications:  SND   PPM Follow Up Remote Check?  No Battery Voltage:  2.78 V     Battery Est. Longevity:  10 YEARS     Pacer Dependent:  Yes       PPM Device Measurements Atrium  Amplitude: PACED AT 30 mV, Impedance: 397 ohms, Threshold: 0.75 V at 0.4 msec Right Ventricle  Amplitude: 11.8 mV, Impedance: 466 ohms, Threshold: 0.75 V at 0.4 msec  Episodes MS Episodes:  17     Percent Mode Switch:  45%     Coumadin:  Yes Ventricular High Rate:  NOT AVAILABLE     Atrial Pacing:  93%     Ventricular Pacing:  4.1%  Parameters Mode:  DDDR     Lower Rate Limit:  70     Upper Rate Limit:  115 Paced AV Delay:  250     Sensed AV Delay:  225 Next Cardiology Appt Due:  11/07/2009 Tech Comments:   Normal device function.  Afib burden up from 4.5% to 45% this check.  Patient on Coumadin and Tikosyn therapy.  Pt feels well and is unable to tell when in atrial fib.  V rates relatively well controlled, about 20% of heart rates are greater than 110 during mode switch.  No changes made today.  Plan for afib per Dr Ladona Ridgel. Gypsy Balsam RN BSN  May 27, 2009 10:27 AM  MD Comments:  Agree with above.

## 2010-04-08 NOTE — Progress Notes (Signed)
Summary: Refill Pantoprazole  Phone Note Call from Patient Call back at [phone]   Call For: Dr Leone Payor Summary of Call: Pantoprazole needs prior auth from [facility] [phone]. They adviced him they faxed [place] and have not heard back from [place]. Initial call taken by: Leanor Kail [facility],  May 16, 2009 9:58 AM  Follow-up for Phone Call        chart requested to research tried/failed drugs. Francee Piccolo CMA Duncan Dull)  May 16, 2009 11:46 AM   I spoke to pt to get complete phone number for [facility].  Pt states that he does not need prior authorization, but approval for refills.  I advised pt we refill for one year on 3/7.   I spoke to [place] at [place], refill approval received and meds shipped on 05/15/09. Message left for pt that refills approved and meds shipped on 3/9. I advised pt to call back with any questions or if he has any problems. Follow-up by: Francee Piccolo CMA Duncan Dull),  May 17, 2009 9:07 AM

## 2010-04-08 NOTE — Assessment & Plan Note (Signed)
Summary: afib   Visit Type:  Follow-up Primary Dorsie Sethi:  Birdie Sons, MD  CC:  shortness of breath.  History of Present Illness: Did not feel bad, but noted that he has been out of rhythm for the past week.  He has known he was in atrial fib before, but felt bad yesterday and today.  In past EF has gone down when out of rhythm.  Last cardioversion was March 2010, and he had no problems.    Current Medications (verified): 1)  Coreg 12.5 Mg Tabs (Carvedilol) .... Take 1 Tablet By Mouth Two Times A Day 2)  Coumadin 5 Mg Tabs (Warfarin Sodium) .... As Directed 3)  Tikosyn 500 Mcg Caps (Dofetilide) .... Take 1 Tablet By Mouth Two Times A Day 4)  Digoxin 0.25 Mg Tabs (Digoxin) .... Take 1 Tablet By Mouth Once A Day 5)  Multivitamins   Tabs (Multiple Vitamin) .... Once Daily 6)  Pantoprazole Sodium 40 Mg  Tbec (Pantoprazole Sodium) .... Take 1 Tablet By Mouth Once A Day 7)  Simvastatin 40 Mg Tabs (Simvastatin) .... Take One Tablet At Bedtime 8)  Prednisone 2.5 Mg Tabs (Prednisone) .... Take One Tablet By Mouth Every Other Day For One Month. and Then 1/2 Every Other Day For One Month and Then Stop 9)  Avodart 0.5 Mg Caps (Dutasteride) .... Once Daily 10)  Fluticasone Propionate 50 Mcg/act  Susp (Fluticasone Propionate) .... 2 Sprays Each Nostril Once Daily  Allergies (verified): 1)  ! * Antihistamines  Past History:  Past Medical History: Last updated: 07/20/2008 SICK SINUS SYNDROME (ICD-427.81) CAD, UNSPECIFIED SITE (ICD-414.00) ATRIAL FIBRILLATION (ICD-427.31) COUMADIN THERAPY (ICD-V58.61) CARDIOMYOPATHY, PRIMARY-NONISCHEMIC EF 45% (ICD-425.4) HYPERLIPIDEMIA (ICD-272.4) GERD (ICD-530.81) POLYMYALGIA RHEUMATICA (ICD-725) DYSPHAGIA (ICD-787.29) MIXED HEARING LOSS BILATERAL (ICD-389.22) PSA, INCREASED (ICD-790.93) BACK PAIN, LUMBAR (ICD-724.2) BENIGN PROSTATIC HYPERTROPHY (ICD-600.00) BREAST MASS (ICD-611.72)  Past Surgical History: Last updated: 02/07/2008 Cataract  extraction. LEFT hernia, umbilical Permanent pacemaker  Family History: Last updated: 2007/04/12 father died MI age 3 mother died AAA 1982--TB, HTN brother died at birth sister  Social History: Last updated: 02/07/2008 Married Alcohol use-yes Regular exercise-yes Patient is a former smoker.  Illicit Drug Use - no  Risk Factors: Exercise: yes (11/29/2006)  Risk Factors: Smoking Status: quit > 6 months (08/08/2009)  Vital Signs:  Patient profile:   77 year old male Height:      68.5 inches Weight:      172 pounds BMI:     25.87 Pulse rate:   82 / minute BP sitting:   102 / 70  (left arm)  Vitals Entered By: Hardin Negus, RMA (December 23, 2009 12:12 PM)  Physical Exam  General:  Well developed, well nourished, in no acute distress. Head:  normocephalic and atraumatic Eyes:  PERRLA/EOM intact; conjunctiva and lids normal. Ears:  TM's intact and clear with normal canals and hearing Lungs:  Clear bilaterally to auscultation and percussion. Heart:  Irregularly, irregular rhythm.   Abdomen:  Bowel sounds positive; abdomen soft and non-tender without masses, organomegaly, or hernias noted. No hepatosplenomegaly. Pulses:  pulses normal in all 4 extremities Extremities:  No clubbing or cyanosis. Neurologic:  Alert and oriented x 3.   EKG  Procedure date:  12/23/2009  Findings:      Atrial fibrillation with controlled ventricular.  Nonspecific ST and T abnormality.    PPM Specifications Following MD:  [name], MD     PPM Vendor:  [employer] Jude     PPM Model Number:  [id number]  PPM Serial Number:  [id number] PPM DOI:  04/14/2005     PPM Implanting MD:  [name], MD  Lead 1    Location: [facility]     DOI: 09/12/1996     Model #: 1342T     Serial #: [id number]     Status: active Lead 2    Location: RV     DOI: 09/12/1996     Model #: 1346T     Serial #: [id number]     Status: active   Indications:  SND   PPM Follow Up Pacer Dependent:  Yes      Episodes Coumadin:   Yes  Parameters Mode:  DDDR     Lower Rate Limit:  70     Upper Rate Limit:  115 Paced AV Delay:  250     Sensed AV Delay:  225  Impression & Recommendations:  Problem # 1:  ATRIAL FIBRILLATION (ICD-427.31) Out of rhythm.  Atrial fib.  Often develops worsening LV function under these circumstances.  Had last episode in March 2010 by Dr. Jones Broom.  Patient agreeable.  Last three INR are above 2.  Remains on Tikosyn.  Patient understands risks and agrees to proceed. His updated medication list for this problem includes:    Coreg 12.5 Mg Tabs (Carvedilol) .Marland Kitchen... Take 1 tablet by mouth two times a day    Coumadin 5 Mg Tabs (Warfarin sodium) .Marland Kitchen... As directed    Tikosyn 500 Mcg Caps (Dofetilide) .Marland Kitchen... Take 1 tablet by mouth two times a day    Digoxin 0.25 Mg Tabs (Digoxin) .Marland Kitchen... Take 1 tablet by mouth once a day  Orders: EKG w/ Interpretation (93000) TLB-BMP (Basic Metabolic Panel-BMET) (80048-METABOL) TLB-CBC Platelet - w/Differential (85025-CBCD) TLB-PT (Protime) (85610-PTP) TLB-Magnesium (Mg) (83735-MG) Cardioversion (Cardioversion)  Problem # 2:  CAD, UNSPECIFIED SITE (ICD-414.00) Continues on medical therapy His updated medication list for this problem includes:    Coreg 12.5 Mg Tabs (Carvedilol) .Marland Kitchen... Take 1 tablet by mouth two times a day    Coumadin 5 Mg Tabs (Warfarin sodium) .Marland Kitchen... As directed  Problem # 3:  CARDIOMYOPATHY, PRIMARY-NONISCHEMIC EF 45% (ICD-425.4) Has done well.  See recent echocardiogram. His updated medication list for this problem includes:    Coreg 12.5 Mg Tabs (Carvedilol) .Marland Kitchen... Take 1 tablet by mouth two times a day    Coumadin 5 Mg Tabs (Warfarin sodium) .Marland Kitchen... As directed    Tikosyn 500 Mcg Caps (Dofetilide) .Marland Kitchen... Take 1 tablet by mouth two times a day    Digoxin 0.25 Mg Tabs (Digoxin) .Marland Kitchen... Take 1 tablet by mouth once a day  Problem # 4:  CARDIAC PACEMAKER IN SITU (ICD-V45.01)  Patient Instructions: 1)  Your physician recommends that you schedule a  follow-up appointment in: 2 WEEKS 2)  Your physician recommends that you have lab work today: BMP, CBC, Magnesium, PT/INR 3)  Your physician has recommended that you have a cardioversion (DCCV).  Electrical cardioversion uses a jolt of electricity to your heart either through paddles or wired patches attached to your chest. This is a controlled, usually prescheduled, procedure. Defibrillation is done under light anesthesia in the hospital, and you usually go home the day of the procedure. This is done to get your heart back into a normal rhythm. You are not awake for the procedure. Please see the instruction sheet given to you today.

## 2010-04-08 NOTE — Letter (Signed)
Summary: [facility]  [facility]   Imported By: Maryln Gottron 10/23/2009 13:25:06  _____________________________________________________________________  External Attachment:    Type:   Image     Comment:   External Document

## 2010-04-08 NOTE — Medication Information (Signed)
Summary: rov/tm  Anticoagulant Therapy  Managed by: Weston Brass, PharmD Referring MD: [name] MD, Maisie Fus PCP: Birdie Sons, MD Supervising MD: Shirlee Latch MD, Dalton Indication 1: Atrial Fibrillation Lab Used: [facility] [place] Site: [address] INR POC 2.2 INR RANGE 2-3  Dietary changes: yes       Details: Had a few more greens and a little cranberry sauce  Health status changes: no    Bleeding/hemorrhagic complications: no    Recent/future hospitalizations: no    Any changes in medication regimen? no    Recent/future dental: no  Any missed doses?: no       Is patient compliant with meds? yes       Allergies: 1)  ! * Antihistamines  Anticoagulation Management History:      The patient is taking warfarin and comes in today for a routine follow up visit.  Positive risk factors for bleeding include an age of 75 years or older.  The bleeding index is 'intermediate risk'.  Positive CHADS2 values include Age > 34 years old.  His last INR was 1.7 ratio.  Anticoagulation responsible provider: Shirlee Latch MD, Dalton.  INR POC: 2.2.  Cuvette Lot#: [id number].  Exp: 02/2011.    Anticoagulation Management Assessment/Plan:      The patient's current anticoagulation dose is Coumadin 5 mg tabs: As directed.  The target INR is 2 - 3.  The next INR is due 03/05/2010.  Anticoagulation instructions were given to patient.  Results were reviewed/authorized by Weston Brass, PharmD.  He was notified by Hoy Register, PharmD Candidate.         Prior Anticoagulation Instructions: INR 2.6 Continue 5mg  daily except 2.5mg s on Tuesdays, Thursdays and Saturdays.  Recheck in 4 weeks.   Current Anticoagulation Instructions: INR 2.2 Continue previous dose of 1 tablet everyday except 0.5 tablet on Tuesday, Thursday, and Saturday Recheck INR in 4 weeks

## 2010-04-08 NOTE — Progress Notes (Signed)
Summary: discuss cardioversion  Phone Note Call from Patient Call back at Home Phone [phone] Call back at Work Phone [phone]   Caller: Patient Reason for Call: Talk to Nurse Summary of Call: per pt calling, would like to discuss cardioversion.  Initial call taken by: Lorne Skeens,  December 24, 2009 10:38 AM  Follow-up for Phone Call        I spoke with the pt and at this time he is feeling better.  The pt would like to hold off on TEE/DCCV at this time.  I do have this procedure scheduled for this Friday.  I asked the pt to call our office on Thursday morning and let me know how he is feeling prior to cancelling this procedure.  The pt agreed.  Follow-up by: Julieta Gutting, RN, BSN,  December 24, 2009 11:46 AM  Additional Follow-up for Phone Call Additional follow up Details #1::        pt will like to have it done cardioversion and wants to talk to nurse Additional Follow-up by: Roe Coombs,  December 26, 2009 8:10 AM    Additional Follow-up for Phone Call Additional follow up Details #2::    I spoke with the pt and he is symptomatic and would like to proceed with TEE/DCCV on 12/27/09.  Follow-up by: Julieta Gutting, RN, BSN,  December 26, 2009 9:07 AM

## 2010-04-08 NOTE — Progress Notes (Signed)
Summary: Need to verify med  Phone Note From Pharmacy Call back at [phone]   Caller: [facility]/ ref# [phone] Summary of Call: Need to verify a medication  Initial call taken by: Judie Grieve,  December 13, 2009 1:28 PM  Follow-up for Phone Call        I called and spoke with the pharmacist at [facility]. She states a fax was sent to our office on 12/11/09 regarding monitoring the pt's creatinine clearance due to Tikosyn. I have given her the pt's most recent BUN/ Creatinine from 10/3 and his most recent weight and age. She has recalculated his creatinine clearance to be 76.1. She states the pt is ok to maintain the current dose of Tikosyn. They will continue to fill at the current dose prescribed. Follow-up by: Sherri Rad, RN, BSN,  December 13, 2009 2:38 PM

## 2010-04-08 NOTE — Medication Information (Signed)
Summary: [facility]  Anticoagulant Therapy  Managed by: Cloyde Reams, RN, BSN Referring MD: [name] MD, Maisie Fus PCP: Birdie Sons, MD Supervising MD: Daleen Squibb MD, Maisie Fus Indication 1: Atrial Fibrillation Lab Used: [facility] [place] Site: [address] PT 18.0 INR POC 1.7 INR RANGE 2-3  Dietary changes: no    Health status changes: no    Bleeding/hemorrhagic complications: no    Recent/future hospitalizations: no    Any changes in medication regimen? no    Recent/future dental: no  Any missed doses?: no       Is patient compliant with meds? yes      Comments: Per [name], Dr. Rosalyn Charters RN.  Pt to have INR checked on 10/20 and TEE/DCCV on 10/21.    Allergies: 1)  ! * Antihistamines  Anticoagulation Management History:      His anticoagulation is being managed by telephone today.  Positive risk factors for bleeding include an age of 53 years or older.  The bleeding index is 'intermediate risk'.  Positive CHADS2 values include Age > 19 years old.  Prothrombin time is 18.0.  Anticoagulation responsible provider: Daleen Squibb MD, Maisie Fus.  INR POC: 1.7.  Exp: 12/2010.    Anticoagulation Management Assessment/Plan:      The patient's current anticoagulation dose is Coumadin 5 mg tabs: As directed.  The target INR is 2 - 3.  The next INR is due 12/26/2009.  Anticoagulation instructions were given to patient.  Results were reviewed/authorized by Cloyde Reams, RN, BSN.  He was notified by Cloyde Reams RN.         Prior Anticoagulation Instructions: INR 2.3  Continue taking one tablet every day except for one-half tablet on Sunday, Tuesday, and Thursday.  Recheck in four weeks.   Current Anticoagulation Instructions: INR 1.7  Attempted to contact pt, lmom to take 10mg  today, then resume same dosage 5mg  daily except 2.5mg  on Sundays, Tuesdays, and Thursdays.  Pending DCCV tomorrow, will await decision as to TEE DCCV or post-pone.   Spoke with pt.  He took 10mg  last  night.  Resume normal dose today.  Recheck INR on 10/20. [name] PharmD  December 24, 2009 10:36 AM

## 2010-04-08 NOTE — Consult Note (Signed)
Summary: [facility]  [facility]   Imported By: Maryln Gottron 04/11/2009 10:22:36  _____________________________________________________________________  External Attachment:    Type:   Image     Comment:   External Document

## 2010-04-08 NOTE — Procedures (Signed)
Summary: [facility]   Current Medications (verified): 1)  Coreg 12.5 Mg Tabs (Carvedilol) .... Take 1 Tablet By Mouth Two Times A Day 2)  Coumadin 5 Mg Tabs (Warfarin Sodium) .... As Directed 3)  Tikosyn 500 Mcg Caps (Dofetilide) .... Take 1 Tablet By Mouth Two Times A Day 4)  Digoxin 0.25 Mg Tabs (Digoxin) .... Take 1 Tablet By Mouth Once A Day 5)  Multivitamins   Tabs (Multiple Vitamin) .... Once Daily 6)  Pantoprazole Sodium 40 Mg  Tbec (Pantoprazole Sodium) .... Take 1 Tablet By Mouth Once A Day 7)  Simvastatin 40 Mg Tabs (Simvastatin) .... Take One Tablet At Bedtime 8)  Prednisone 2.5 Mg Tabs (Prednisone) .... Take One Tablet By Mouth Every Other Day For One Month. and Then 1/2 Every Other Day For One Month and Then Stop 9)  Avodart 0.5 Mg Caps (Dutasteride) .... Once Daily 10)  Fluticasone Propionate 50 Mcg/act  Susp (Fluticasone Propionate) .... 2 Sprays Each Nostril Once Daily  Allergies (verified): 1)  ! * Antihistamines  PPM Specifications Following MD:  [name], MD     PPM Vendor:  [employer] Jude     PPM Model Number:  [id number]     PPM Serial Number:  [id number] PPM DOI:  04/14/2005     PPM Implanting MD:  [name], MD  Lead 1    Location: [facility]     DOI: 09/12/1996     Model #: 1342T     Serial #: [id number]     Status: active Lead 2    Location: RV     DOI: 09/12/1996     Model #: 1346T     Serial #: [id number]     Status: active   Indications:  SND   PPM Follow Up Battery Voltage:  2.78 V     Battery Est. Longevity:  7-9.75 yrs     Pacer Dependent:  Yes       PPM Device Measurements Atrium  Impedance: 405 ohms,  Right Ventricle  Amplitude: 10.3 mV, Impedance: 494 ohms, Threshold: 0.750 V at 0.4 msec  Episodes MS Episodes:  6     Percent Mode Switch:  39%     Coumadin:  Yes Ventricular High Rate:  0     Atrial Pacing:  94%     Ventricular Pacing:  1.9%  Parameters Mode:  DDDR     Lower Rate Limit:  70     Upper Rate Limit:  115 Paced AV Delay:  250     Sensed AV  Delay:  225 Tech Comments:  INTERROGATION ONLY---PT IN MODE SWITCH 39% OF TIME SINCE LAST CHECK ON 12-03-09.  PT WENT IN MODE SWITCH ON 12-15-09. PLAN PER DR [name]. Vella Kohler  December 23, 2009 12:52 PM

## 2010-04-08 NOTE — Assessment & Plan Note (Signed)
Summary: [name]   Visit Type:  Follow-up Primary Provider:  Birdie Sons, MD   History of Present Illness: Still staying active and doing the elliptical trainer.   He is now at the [facility] [place].   No shortness of breath with activity.  Was in atrial fib in August, and not aware.  Dr. Cato Mulligan may take him off of Prednisone if possible.  Remains class I/II.  Last echo was 2008.  Also, very active.  We discussed alternatives to warfarin in some detail.   His time in therapeutic range is excellent.    Current Medications (verified): 1)  Coreg 12.5 Mg Tabs (Carvedilol) .... Take 1 Tablet By Mouth Two Times A Day 2)  Coumadin 5 Mg Tabs (Warfarin Sodium) .... As Directed 3)  Tikosyn 500 Mcg Caps (Dofetilide) .... Take 1 Tablet By Mouth Two Times A Day 4)  Digoxin 0.25 Mg Tabs (Digoxin) .... Take 1 Tablet By Mouth Once A Day 5)  Multivitamins   Tabs (Multiple Vitamin) .... Once Daily 6)  Pantoprazole Sodium 40 Mg  Tbec (Pantoprazole Sodium) .... Take 1 Tablet By Mouth Once A Day 7)  Simvastatin 40 Mg Tabs (Simvastatin) .... Take One Tablet At Bedtime 8)  Prednisone 2.5 Mg Tabs (Prednisone) .... Take 1 Tablet By Mouth Once A Day For One Month and Then Every Other Day For One Month and Then Stop 9)  Avodart 0.5 Mg Caps (Dutasteride) .... Once Daily 10)  Fluticasone Propionate 50 Mcg/act  Susp (Fluticasone Propionate) .... 2 Sprays Each Nostril Once Daily  Allergies: 1)  ! * Antihistamines  Vital Signs:  Patient profile:   77 year old male Height:      68.5 inches Weight:      175 pounds Pulse rate:   69 / minute BP sitting:   110 / 60  (left arm)  Vitals Entered By: Laurance Flatten CMA (December 03, 2009 8:51 AM)  Physical Exam  General:  Well developed, well nourished, in no acute distress. Head:  normocephalic and atraumatic Eyes:  PERRLA/EOM intact; conjunctiva and lids normal. Lungs:  Clear bilaterally to auscultation and percussion. Heart:  Normal S1 and S2.  Positive S4.  No murmur.    Abdomen:  Bowel sounds positive; abdomen soft and non-tender without masses, organomegaly, or hernias noted. No hepatosplenomegaly. Extremities:  No clubbing or cyanosis. Neurologic:  Alert and oriented x 3.   EKG  Procedure date:  12/03/2009  Findings:      Electronic atrial pacing.  Leftward axis deviation.  Non specific ST and T abnormality.    PPM Specifications Following MD:  [name], MD     PPM Vendor:  St Jude     PPM Model Number:  [id number]     PPM Serial Number:  [id number] PPM DOI:  04/14/2005     PPM Implanting MD:  [name], MD  Lead 1    Location: RA     DOI: 09/12/1996     Model #: 1342T     Serial #: [id number]     Status: active Lead 2    Location: RV     DOI: 09/12/1996     Model #: 1346T     Serial #: [id number]     Status: active   Indications:  SND   PPM Follow Up Battery Voltage:  2.79 V     Battery Est. Longevity:  6.25-8.75 yrs     Pacer Dependent:  Yes       PPM Device Measurements Atrium  Amplitude: 0.4 mV, Impedance: 395 ohms, Threshold: 0.50 V at 0.4 msec Right Ventricle  Amplitude: 11.7 mV, Impedance: 482 ohms, Threshold: 0.750 V at 0.4 msec  Episodes MS Episodes:  5     Percent Mode Switch:  14%     Coumadin:  Yes Ventricular High Rate:  0     Atrial Pacing:  92%     Ventricular Pacing:  3.7%  Parameters Mode:  DDDR     Lower Rate Limit:  70     Upper Rate Limit:  115 Paced AV Delay:  250     Sensed AV Delay:  225 Next Cardiology Appt Due:  05/12/2010 Tech Comments:  5 AMS EPISODES--LONGEST WAS 13 DAYS. + COUMADIN.  NORMAL DEVICE FUNCTION.  NO CHANGES MADE. ROV IN 6 MTHS W/DEVICE CLINIC. Vella Kohler  December 03, 2009 9:45 AM  Impression & Recommendations:  Problem # 1:  ATRIAL FIBRILLATION (ICD-427.31)  doing well.  Was out of rhythm without noticing it.  No symptoms.  Tolerating meds well.   His updated medication list for this problem includes:    Coreg 12.5 Mg Tabs (Carvedilol) .Marland Kitchen... Take 1 tablet by mouth two times a day    Coumadin  5 Mg Tabs (Warfarin sodium) .Marland Kitchen... As directed    Tikosyn 500 Mcg Caps (Dofetilide) .Marland Kitchen... Take 1 tablet by mouth two times a day    Digoxin 0.25 Mg Tabs (Digoxin) .Marland Kitchen... Take 1 tablet by mouth once a day  Orders: EKG w/ Interpretation (93000) Echocardiogram (Echo)  Problem # 2:  CARDIOMYOPATHY, PRIMARY-NONISCHEMIC EF 45% (ICD-425.4) Class I/II.  Last echo was 2008, so should be repeated although unlikely to change treatment.   His updated medication list for this problem includes:    Coreg 12.5 Mg Tabs (Carvedilol) .Marland Kitchen... Take 1 tablet by mouth two times a day    Coumadin 5 Mg Tabs (Warfarin sodium) .Marland Kitchen... As directed    Tikosyn 500 Mcg Caps (Dofetilide) .Marland Kitchen... Take 1 tablet by mouth two times a day    Digoxin 0.25 Mg Tabs (Digoxin) .Marland Kitchen... Take 1 tablet by mouth once a day  Problem # 3:  HYPERLIPIDEMIA (ICD-272.4) stable at present.  Near target.   His updated medication list for this problem includes:    Simvastatin 40 Mg Tabs (Simvastatin) .Marland Kitchen... Take one tablet at bedtime  Problem # 4:  COUMADIN THERAPY (ICD-V58.61) We discussed dabigatran.   Patient Instructions: 1)  Your physician recommends that you continue on your current medications as directed. Please refer to the Current Medication list given to you today. 2)  Your physician wants you to follow-up in:  6 MONTHS with Dr [name] and Device nurse.  You will receive a reminder letter in the mail two months in advance. If you don't receive a letter, please call our office to schedule the follow-up appointment. 3)  Your physician has requested that you have an echocardiogram.  Echocardiography is a painless test that uses sound waves to create images of your heart. It provides your doctor with information about the size and shape of your heart and how well your heart's chambers and valves are working.  This procedure takes approximately one hour. There are no restrictions for this procedure. 4)  Your physician recommends that you return for  lab work: CBC and BMP (425.4, 272.4)

## 2010-04-08 NOTE — Progress Notes (Signed)
Summary: pt is in a-fib   Phone Note Call from Patient Call back at Home Phone [phone]   Caller: Patient Reason for Call: Talk to Nurse, Talk to Doctor Summary of Call: pt is in a-fib /pt is SOB / no chest pain and no other symptoms  Initial call taken by: Omer Jack,  December 23, 2009 9:43 AM  Follow-up for Phone Call        pt feels like he went into a-fib late last week, and hasn't come out yet, he is more SOB and feels bad which is unusual for him, will discuss w/Lauren and Dr [name] and call pt back Meredith Staggers, RN  December 23, 2009 9:55 AM   I spoke with the pt and scheduled him to see Dr [name] today at 11:30 for evaluation.  Follow-up by: Julieta Gutting, RN, BSN,  December 23, 2009 10:08 AM

## 2010-04-08 NOTE — Letter (Signed)
Summary: Cardioversion/TEE Instructions  [facility], Main Office  [address]   [place], [place] [address]   Phone: [phone]  Fax: [phone]    Cardioversion / TEE Cardioversion Instructions  12/26/2009 MRN: [id number]  [facility] [address] [place], [place]  [address]  Dear [name], You are scheduled for a TEE Cardioversion on Friday December 27, 2009 with Dr. Elease Hashimoto.   Please arrive at the [facility] at 12:30 p.m. on the day of your procedure.  1)   DIET:  A)   May have clear liquid breakfast, then nothing to eat or drink after 7:00 a.m.       Clear liquids include:  water, broth, Sprite, Ginger Ale, black coffee, tea (no sugar),      cranberry / grape / apple juice, jello (not red), popsicle from clear juices (not red).  2)   MAKE SURE YOU TAKE YOUR COUMADIN.  3)   A)   DO NOT TAKE these medications before your procedure:      DO NOT TAKE DIGOXIN THE MORNING OF PROCEDURE.  B)   YOU MAY TAKE ALL of your remaining medications with a small amount of water.    4)  Must have a responsible person to drive you home.  5)   Bring a current list of your medications and current insurance cards.   * Special Note:  Every effort is made to have your procedure done on time. Occasionally there are emergencies that present themselves at the hospital that may cause delays. Please be patient if a delay does occur.  * If you have any questions after you get home, please call the office at [phone].

## 2010-04-08 NOTE — Letter (Signed)
Summary: Cardioversion  [employer], Main Office  [address]   [place], [place] [address]   Phone: [phone]  Fax: [phone]    Cardioversion / TEE Cardioversion Instructions  12/23/2009 MRN: [id number]  [facility] [address] [place], [place]  [address]  Dear Mr. Hornsby, You are scheduled for a Cardioversion on Tuesday December 24, 2009 with Dr. Gala Romney.   Please arrive at the [facility] at 11:00 a.m. on the day of your procedure.  1)   DIET:  A)   May have clear liquid breakfast, then nothing to eat or drink after 7:00 a.m.      Clear liquids include:  water, broth, Sprite, Ginger Ale, black coffee, tea (no sugar),      cranberry / grape / apple juice, jello (not red), popsicle from clear juices (not red).  2)   MAKE SURE YOU TAKE YOUR COUMADIN.  3)   A)   DO NOT TAKE these medications before your procedure:      DO NOT TAKE DIGOXIN THE MORNING OF PROCEDURE.  B)   YOU MAY TAKE ALL of your remaining medications with a small amount of water.  4)  Must have a responsible person to drive you home.  5)   Bring a current list of your medications and current insurance cards.   * Special Note:  Every effort is made to have your procedure done on time. Occasionally there are emergencies that present themselves at the hospital that may cause delays. Please be patient if a delay does occur.  * If you have any questions after you get home, please call the office at [phone].

## 2010-04-08 NOTE — Assessment & Plan Note (Signed)
Summary: [name]   Primary Provider:  Birdie Sons, MD  CC:  6 month rov.  History of Present Illness: Feeling pretty good.  Physically, he is working out four or five days per week.  Went back to the gym.  Is not on the elliptical trainer, and is much improved.   He saw a neurosurgeon who told him his "back looked terrible".  Has alot of stiffness in pain.  Has been told to improve core strength.  No chest pain.  The atrial fib he had is not bothering him.    Current Medications (verified): 1)  Coreg 12.5 Mg Tabs (Carvedilol) .... Take 1 Tablet By Mouth Two Times A Day 2)  Coumadin 5 Mg Tabs (Warfarin Sodium) .... As Directed 3)  Tikosyn 500 Mcg Caps (Dofetilide) .... Take 1 Tablet By Mouth Two Times A Day 4)  Digoxin 0.25 Mg Tabs (Digoxin) .... Take 1 Tablet By Mouth Once A Day 5)  Multivitamins   Tabs (Multiple Vitamin) .... Once Daily 6)  Pantoprazole Sodium 40 Mg  Tbec (Pantoprazole Sodium) .... Take 1 Tablet By Mouth Once A Day 7)  Simvastatin 40 Mg Tabs (Simvastatin) .... Take One Tablet At Bedtime 8)  Prednisone 5 Mg Tabs (Prednisone) .... Take One Tablet By Mouth Every Other Day Alternating With 1/2 Tab By Mouth Every Other Day 9)  Finasteride 5 Mg Tabs (Finasteride) .... Take 1 Tablet By Mouth Once A Day  Allergies (verified): 1)  ! * Antihistamines  Vital Signs:  Patient profile:   77 year old male Height:      68.5 inches Weight:      174 pounds BMI:     26.17 Pulse rate:   76 / minute Pulse rhythm:   regular BP sitting:   110 / 60  (left arm) Cuff size:   regular  Vitals Entered By: Vikki Ports (August 12, 2009 4:07 PM)  Physical Exam  General:  Well developed, well nourished, in no acute distress. Head:  normocephalic and atraumatic Eyes:  PERRLA/EOM intact; conjunctiva and lids normal. Lungs:  Clear bilaterally to auscultation and percussion. Heart:  PMI non displaced.  Normal S1 and S2.  Minimal SEM.  No DM appreciated.  No gallop or rub. Extremities:  No  clubbing or cyanosis. Neurologic:  Alert and oriented x 3.   EKG  Procedure date:  08/12/2009  Findings:      Atrial pacing, ventricular tracking.  Leftward axis, and borderline IVCD.  No acute changes.    PPM Specifications Following MD:  [name], MD     PPM Vendor:  [employer] Jude     PPM Model Number:  [id number]     PPM Serial Number:  [id number] PPM DOI:  04/14/2005     PPM Implanting MD:  [name], MD  Lead 1    Location: RA     DOI: 09/12/1996     Model #: 1342T     Serial #: [id number]     Status: active Lead 2    Location: RV     DOI: 09/12/1996     Model #: 1346T     Serial #: [id number]     Status: active   Indications:  SND   PPM Follow Up Pacer Dependent:  Yes      Episodes Coumadin:  Yes  Parameters Mode:  DDDR     Lower Rate Limit:  70     Upper Rate Limit:  115 Paced AV Delay:  250  Sensed AV Delay:  225  Impression & Recommendations:  Problem # 1:  ATRIAL FIBRILLATION (ICD-427.31) May be in and out at times, but not aware.  Thinks he is doing very well, and remaining very active.  No current symptoms.  Continue current regimen. His updated medication list for this problem includes:    Coreg 12.5 Mg Tabs (Carvedilol) .Marland Kitchen... Take 1 tablet by mouth two times a day    Coumadin 5 Mg Tabs (Warfarin sodium) .Marland Kitchen... As directed    Tikosyn 500 Mcg Caps (Dofetilide) .Marland Kitchen... Take 1 tablet by mouth two times a day    Digoxin 0.25 Mg Tabs (Digoxin) .Marland Kitchen... Take 1 tablet by mouth once a day  Problem # 2:  CARDIOMYOPATHY, PRIMARY-NONISCHEMIC EF 45% (ICD-425.4) Remains Class I, without symptoms.  In followup, will suggest echocardiogram at next office visit.  His updated medication list for this problem includes:    Coreg 12.5 Mg Tabs (Carvedilol) .Marland Kitchen... Take 1 tablet by mouth two times a day    Coumadin 5 Mg Tabs (Warfarin sodium) .Marland Kitchen... As directed    Tikosyn 500 Mcg Caps (Dofetilide) .Marland Kitchen... Take 1 tablet by mouth two times a day    Digoxin 0.25 Mg Tabs (Digoxin) .Marland Kitchen... Take 1 tablet by  mouth once a day  Problem # 3:  CAD, UNSPECIFIED SITE (ICD-414.00) Non obstructive, and issues of statins reviewed with patient regarding underlying findings. Prior cath document reviewed.    Problem # 4:  HYPERLIPIDEMIA (ICD-272.4) remains on treatment.  Reasons for treatment discussed in detail.  Patient agreeable to continue. His updated medication list for this problem includes:    Simvastatin 40 Mg Tabs (Simvastatin) .Marland Kitchen... Take one tablet at bedtime  Other Orders: EKG w/ Interpretation (93000)  Patient Instructions: 1)  Your physician recommends that you schedule a follow-up appointment in: 3 MONTHS 2)  Your physician recommends that you return for a FASTING LIPID and LIVER Profile in 3 MONTHS  (272.4, 414.01, 425.4) 3)  Your physician recommends that you continue on your current medications as directed. Please refer to the Current Medication list given to you today.

## 2010-04-08 NOTE — Medication Information (Signed)
Summary: [name]  Anticoagulant Therapy  Managed by: Cloyde Reams, RN, BSN Referring MD: [name] MD, Maisie Fus PCP: Birdie Sons, MD Supervising MD: Tenny Craw MD, Gunnar Fusi Indication 1: Atrial Fibrillation Lab Used: [facility] [place] Site: [address] INR POC 2.9 INR RANGE 2-3  Dietary changes: no    Health status changes: no    Bleeding/hemorrhagic complications: no    Recent/future hospitalizations: no    Any changes in medication regimen? no    Recent/future dental: no  Any missed doses?: no       Is patient compliant with meds? yes       Allergies (verified): 1)  ! * Antihistamines  Anticoagulation Management History:      The patient is taking warfarin and comes in today for a routine follow up visit.  Positive risk factors for bleeding include an age of 75 years or older.  The bleeding index is 'intermediate risk'.  Positive CHADS2 values include Age > 37 years old.  Anticoagulation responsible provider: Tenny Craw MD, Gunnar Fusi.  INR POC: 2.9.  Cuvette Lot#: [id number].  Exp: 06/2010.    Anticoagulation Management Assessment/Plan:      The patient's current anticoagulation dose is Coumadin 5 mg tabs: As directed.  The target INR is 2 - 3.  The next INR is due 04/26/2009.  Anticoagulation instructions were given to patient.  Results were reviewed/authorized by Cloyde Reams, RN, BSN.  He was notified by Cloyde Reams RN.         Prior Anticoagulation Instructions: INR 1.4  Take 1.5 tablets today then take 0.5 tablet daily except 1 tablet on Mondays, Wednesdays, Fridays, and Saturdays.  Recheck in 2 weeks.  Current Anticoagulation Instructions: INR 2.9  Continue on same dosage 1 tablet daily except 1/2 tablet on Sundays, Tuesdays, and Thursdays.  Recheck in 3 weeks.

## 2010-04-08 NOTE — Medication Information (Signed)
Summary: rov/tm  Anticoagulant Therapy  Managed by: Bethena Midget, RN, BSN Referring MD: [name] MD, Maisie Fus PCP: Birdie Sons, MD Supervising MD: [name] MD, Maisie Fus Indication 1: Atrial Fibrillation Lab Used: [facility] [place] Site: [address] INR POC 2.6 INR RANGE 2-3  Dietary changes: no    Health status changes: yes       Details: Has Cold S&S  Bleeding/hemorrhagic complications: no    Recent/future hospitalizations: no    Any changes in medication regimen? yes       Details: Prednisone present dose is 2.5mg s QOD, will decrease on 01/16/10 to 1.25mg s QOD   Recent/future dental: no  Any missed doses?: no       Is patient compliant with meds? yes      Comments: Had DCCV done 12/27/09. Seeing Dr [name] today.   Current Medications (verified): 1)  Coreg 12.5 Mg Tabs (Carvedilol) .... Take 1 Tablet By Mouth Two Times A Day 2)  Coumadin 5 Mg Tabs (Warfarin Sodium) .... As Directed 3)  Tikosyn 500 Mcg Caps (Dofetilide) .... Take 1 Tablet By Mouth Two Times A Day 4)  Digoxin 0.25 Mg Tabs (Digoxin) .... Take 1 Tablet By Mouth Once A Day 5)  Multivitamins   Tabs (Multiple Vitamin) .... Once Daily 6)  Pantoprazole Sodium 40 Mg  Tbec (Pantoprazole Sodium) .... Take 1 Tablet By Mouth Qod 7)  Simvastatin 40 Mg Tabs (Simvastatin) .... Take One Tablet At Bedtime 8)  Prednisone 2.5 Mg Tabs (Prednisone) .... Take One Tablet By Mouth Every Other Day For One Month. and Then 1/2 Every Other Day For One Month and Then Stop 9)  Avodart 0.5 Mg Caps (Dutasteride) .... Once Daily  Allergies: 1)  ! * Antihistamines  Anticoagulation Management History:      The patient is taking warfarin and comes in today for a routine follow up visit.  Positive risk factors for bleeding include an age of 75 years or older.  The bleeding index is 'intermediate risk'.  Positive CHADS2 values include Age > 75 years old.  His last INR was 1.7 ratio.  Anticoagulation responsible provider:  [name] MD, Maisie Fus.  INR POC: 2.6.  Cuvette Lot#: [id number].  Exp: 01/2011.    Anticoagulation Management Assessment/Plan:      The patient's current anticoagulation dose is Coumadin 5 mg tabs: As directed.  The target INR is 2 - 3.  The next INR is due 02/05/2010.  Anticoagulation instructions were given to patient.  Results were reviewed/authorized by Bethena Midget, RN, BSN.  He was notified by Bethena Midget, RN, BSN.         Prior Anticoagulation Instructions: INR 3.1 Today take 1mg  then resume 5mg s daily except 2.5mg s on Tuesdays, Thursdays and Sundays. Recheck in one week.  Sample 1mg   given Exp 04/2010 Lot # [id number]  Current Anticoagulation Instructions: INR 2.6 Continue 5mg  daily except 2.5mg s on Tuesdays, Thursdays and Saturdays.  Recheck in 4 weeks.

## 2010-04-08 NOTE — Assessment & Plan Note (Signed)
Summary: rov/et/PT RESCD FROM BUMP//CCM   Vital Signs:  Patient profile:   77 year old male Weight:      173 pounds Temp:     98 degrees F oral Pulse rate:   64 / minute Pulse rhythm:   irregular Resp:     12 per minute BP sitting:   116 / 58  (left arm) Cuff size:   regular  Vitals Entered By: Gladis Riffle, RN (June 11, 2009 7:57 AM) CC: rov-to begin prednisone 7.5mg  today Is Patient Diabetic? No   Primary Care Provider:  Birdie Sons, MD  CC:  rov-to begin prednisone 7.5mg  today.  History of Present Illness: hip pain resolved---thinks exercise helped  AFIB---regular f/u with CV and coumadin clinic  PMR--Prednisone mistakingly taking 10 mg once daily (was going to take 7.5)  All other systems reviewed and were negative   Preventive Screening-Counseling & Management  Alcohol-Tobacco     Smoking Status: quit > 6 months     Year Started: 1949     Year Quit: 1985  Current Medications (verified): 1)  Coreg 12.5 Mg Tabs (Carvedilol) .... Take 1 Tablet By Mouth Two Times A Day 2)  Coumadin 5 Mg Tabs (Warfarin Sodium) .... As Directed 3)  Tikosyn 500 Mcg Caps (Dofetilide) .... Take 1 Tablet By Mouth Two Times A Day 4)  Digoxin 0.25 Mg Tabs (Digoxin) .... Take 1 Tablet By Mouth Once A Day 5)  Multivitamins   Tabs (Multiple Vitamin) .... Once Daily 6)  Pantoprazole Sodium 40 Mg  Tbec (Pantoprazole Sodium) .... Take 1 Tablet By Mouth Once A Day 7)  Simvastatin 40 Mg Tabs (Simvastatin) .... Take One Tablet At Bedtime 8)  Prednisone 5 Mg Tabs (Prednisone) .... Take 1 and 1/2 Tabs By Mouth Once Daily 9)  Finasteride 5 Mg Tabs (Finasteride) .... Take 1 Tablet By Mouth Once A Day  Allergies: 1)  ! * Antihistamines  Physical Exam  General:  alert and well-developed.   Head:  normocephalic and atraumatic.   Eyes:  pupils equal and pupils round.   Ears:  R ear normal and L ear normal.   Nose:  no external deformity and no external erythema.   Neck:  No deformities, masses, or  tenderness noted. Chest Wall:  No deformities, masses, tenderness or gynecomastia noted. Lungs:  normal respiratory effort and no intercostal retractions.   Heart:  normal rate and regular rhythm.   Abdomen:  soft and non-tender.   Msk:  No deformity or scoliosis noted of thoracic or lumbar spine.   Neurologic:  cranial nerves II-XII intact and gait normal.     Impression & Recommendations:  Problem # 1:  HIP PAIN, RIGHT (ICD-719.45) resolved likely related to spinal stenosis asymptomatic  Problem # 2:  ATRIAL FIBRILLATION (ICD-427.31) asymptomatic His updated medication list for this problem includes:    Coreg 12.5 Mg Tabs (Carvedilol) .Marland Kitchen... Take 1 tablet by mouth two times a day    Coumadin 5 Mg Tabs (Warfarin sodium) .Marland Kitchen... As directed    Tikosyn 500 Mcg Caps (Dofetilide) .Marland Kitchen... Take 1 tablet by mouth two times a day    Digoxin 0.25 Mg Tabs (Digoxin) .Marland Kitchen... Take 1 tablet by mouth once a day  Reviewed the following: PT: 23.8 (10/16/2008)    Coumadin Dose (weekly): 27.50 mg (05/27/2009) Prior Coumadin Dose (weekly): 27.50 mg (05/27/2009) Next Protime: 06/17/2009 (dated on 05/27/2009)  Problem # 3:  HYPERLIPIDEMIA (ICD-272.4) controlled continue current medications  His updated medication list for this problem  includes:    Simvastatin 40 Mg Tabs (Simvastatin) .Marland Kitchen... Take one tablet at bedtime  Labs Reviewed: SGOT: 29 (01/07/2009)   SGPT: 41 (01/07/2009)   HDL:31.70 (01/07/2009), 41.40 (08/15/2008)  LDL:66 (01/07/2009), 75 (08/15/2008)  Chol:124 (01/07/2009), 143 (08/15/2008)  Trig:132.0 (01/07/2009), 135.0 (08/15/2008)  Complete Medication List: 1)  Coreg 12.5 Mg Tabs (Carvedilol) .... Take 1 tablet by mouth two times a day 2)  Coumadin 5 Mg Tabs (Warfarin sodium) .... As directed 3)  Tikosyn 500 Mcg Caps (Dofetilide) .... Take 1 tablet by mouth two times a day 4)  Digoxin 0.25 Mg Tabs (Digoxin) .... Take 1 tablet by mouth once a day 5)  Multivitamins Tabs (Multiple vitamin)  .... Once daily 6)  Pantoprazole Sodium 40 Mg Tbec (Pantoprazole sodium) .... Take 1 tablet by mouth once a day 7)  Simvastatin 40 Mg Tabs (Simvastatin) .... Take one tablet at bedtime 8)  Prednisone 5 Mg Tabs (Prednisone) .... Take 1 and 1/2 tabs by mouth once daily 9)  Finasteride 5 Mg Tabs (Finasteride) .... Take 1 tablet by mouth once a day  Patient Instructions: 1)  Please schedule a follow-up appointment in 2 months. 2)  prednisone 7.5 mg by mouth once daily for 1 month and then 5 mg by mouth once daily for one month

## 2010-04-08 NOTE — Medication Information (Signed)
Summary: rov/sp  Anticoagulant Therapy  Managed by: Eda Keys, PharmD Referring MD: [name] MD, Maisie Fus PCP: Birdie Sons, MD Supervising MD: Gala Romney MD, Reuel Boom Indication 1: Atrial Fibrillation Lab Used: [facility] [place] Site: [address] INR POC 1.9 INR RANGE 2-3  Dietary changes: no    Health status changes: no    Bleeding/hemorrhagic complications: no    Recent/future hospitalizations: no    Any changes in medication regimen? no    Recent/future dental: no  Any missed doses?: no       Is patient compliant with meds? yes       Allergies: 1)  ! * Antihistamines  Anticoagulation Management History:      The patient is taking warfarin and comes in today for a routine follow up visit.  Positive risk factors for bleeding include an age of 40 years or older.  The bleeding index is 'intermediate risk'.  Positive CHADS2 values include Age > 101 years old.  Anticoagulation responsible provider: Bensimhon MD, Reuel Boom.  INR POC: 1.9.  Cuvette Lot#: [id number].  Exp: 08/2010.    Anticoagulation Management Assessment/Plan:      The patient's current anticoagulation dose is Coumadin 5 mg tabs: As directed.  The target INR is 2 - 3.  The next INR is due 08/01/2009.  Anticoagulation instructions were given to patient.  Results were reviewed/authorized by Eda Keys, PharmD.  He was notified by Eda Keys.         Prior Anticoagulation Instructions: INR 2.3  Continue same dose of 1 tablet every day except 1/2 tablet on Sunday, Tuesday, and Thursday   Current Anticoagulation Instructions: INR 1.9  Take 1 tablet today.  Then return to normal dosing schedule of 1/2 tablet on Sunday, Tuesday, and Thursday, and 1 tablet all other days.  Return to clinic in 3 weeks.

## 2010-04-08 NOTE — Medication Information (Signed)
Summary: rov/ewj  Anticoagulant Therapy  Managed by: Bethena Midget, RN Referring MD: [name] MD, Maisie Fus PCP: Birdie Sons, MD Supervising MD: Jens Som MD, Arlys John Indication 1: Atrial Fibrillation Lab Used: [facility] [place] Site: [address] INR POC 1.4 INR RANGE 2-3  Dietary changes: yes       Details: Patient says he ate several servings of green vegetables over the holidays.  Health status changes: no    Bleeding/hemorrhagic complications: no    Recent/future hospitalizations: no    Any changes in medication regimen? no    Recent/future dental: no  Any missed doses?: no       Is patient compliant with meds? yes       Allergies: 1)  ! * Antihistamines  Anticoagulation Management History:      The patient is taking warfarin and comes in today for a routine follow up visit.  Positive risk factors for bleeding include an age of 61 years or older.  The bleeding index is 'intermediate risk'.  Positive CHADS2 values include Age > 52 years old.  Anticoagulation responsible provider: Jens Som MD, Arlys John.  INR POC: 1.4.  Cuvette Lot#: [id number].  Exp: 06/2010.    Anticoagulation Management Assessment/Plan:      The patient's current anticoagulation dose is Coumadin 5 mg tabs: As directed.  The target INR is 2 - 3.  The next INR is due 04/05/2009.  Anticoagulation instructions were given to patient.  Results were reviewed/authorized by Bethena Midget, RN.  He was notified by Lew Dawes, PharmD Candidate.         Prior Anticoagulation Instructions: INR 2.4  Continue on same dosage 1/2 tablet daily except 1 tablet on Mondays, Wednesdays, and Fridays.  Recheck in 3-4 weeks.    Current Anticoagulation Instructions: INR 1.4  Take 1.5 tablets today then take 0.5 tablet daily except 1 tablet on Mondays, Wednesdays, Fridays, and Saturdays.  Recheck in 2 weeks.

## 2010-04-08 NOTE — Progress Notes (Signed)
Summary: REQ FOR RETURN CALL - MED (Prednisone) CONCERNS  Phone Note Call from Patient   Caller: Patient [phone] Reason for Call: Refill Medication Summary of Call: Pt called to speak with Dr Cato Mulligan.... Pt adv that he was wanting to see if Dr Cato Mulligan wants him to get his prescription refill for med: Prednisone 5 Mg Tabs (Prednisone) or if he needs to change dosage amt or if he even needs to continue taking med.... Pt req a return call from Dr Cato Mulligan or Alvino Chapel, RN ref to medication..... Pt can be reached at [phone].   Initial call taken by: Debbra Riding,  May 13, 2009 1:37 PM  Follow-up for Phone Call        per dr Lieutenant Abarca pt should take 1 1/2 tabs daily and make appt for early april.  Patient notified. Appt made 06/12/09 Follow-up by: Gladis Riffle, RN,  May 13, 2009 2:51 PM

## 2010-04-08 NOTE — Assessment & Plan Note (Signed)
Summary: 1 month follow up/cjr   Vital Signs:  Patient profile:   77 year old male Height:      68.5 inches (173.99 cm) Weight:      172.13 pounds (78.24 kg) Temp:     97.9 degrees F (36.61 degrees C) oral Pulse rate:   74 / minute BP sitting:   122 / 72  (left arm) Cuff size:   regular  Vitals Entered By: Josph Macho RMA (September 16, 2009 8:47 AM) CC: 1 month follow up/ CF Is Patient Diabetic? No   Primary Care Provider:  Birdie Sons, MD  CC:  1 month follow up/ CF.  History of Present Illness: PMR---tapering dose of prednisone and doing well stays active---  Current Medications (verified): 1)  Coreg 12.5 Mg Tabs (Carvedilol) .... Take 1 Tablet By Mouth Two Times A Day 2)  Coumadin 5 Mg Tabs (Warfarin Sodium) .... As Directed 3)  Tikosyn 500 Mcg Caps (Dofetilide) .... Take 1 Tablet By Mouth Two Times A Day 4)  Digoxin 0.25 Mg Tabs (Digoxin) .... Take 1 Tablet By Mouth Once A Day 5)  Multivitamins   Tabs (Multiple Vitamin) .... Once Daily 6)  Pantoprazole Sodium 40 Mg  Tbec (Pantoprazole Sodium) .... Take 1 Tablet By Mouth Once A Day 7)  Simvastatin 40 Mg Tabs (Simvastatin) .... Take One Tablet At Bedtime 8)  Prednisone 5 Mg Tabs (Prednisone) .... Take One Tablet By Mouth Every Other Day Alternating With 1/2 Tab By Mouth Every Other Day 9)  Avodart 0.5 Mg Caps (Dutasteride) .... Once Daily  Allergies (verified): 1)  ! * Antihistamines  Physical Exam  General:  alert and well-developed.   Msk:  no joint swelling   Impression & Recommendations:  Problem # 1:  POLYMYALGIA RHEUMATICA (ICD-725) see meds adjusted prednisone I'll see in 3 months  Problem # 2:  RHINITIS (ICD-477.9)  tral fluticasone  His updated medication list for this problem includes:    Fluticasone Propionate 50 Mcg/act Susp (Fluticasone propionate) .Marland Kitchen... 2 sprays each nostril once daily  Complete Medication List: 1)  Coreg 12.5 Mg Tabs (Carvedilol) .... Take 1 tablet by mouth two times a  day 2)  Coumadin 5 Mg Tabs (Warfarin sodium) .... As directed 3)  Tikosyn 500 Mcg Caps (Dofetilide) .... Take 1 tablet by mouth two times a day 4)  Digoxin 0.25 Mg Tabs (Digoxin) .... Take 1 tablet by mouth once a day 5)  Multivitamins Tabs (Multiple vitamin) .... Once daily 6)  Pantoprazole Sodium 40 Mg Tbec (Pantoprazole sodium) .... Take 1 tablet by mouth once a day 7)  Simvastatin 40 Mg Tabs (Simvastatin) .... Take one tablet at bedtime 8)  Prednisone 2.5 Mg Tabs (Prednisone) .... Take 1 tablet by mouth once a day for one month and then every other day for one month and then stop 9)  Avodart 0.5 Mg Caps (Dutasteride) .... Once daily 10)  Fluticasone Propionate 50 Mcg/act Susp (Fluticasone propionate) .... 2 sprays each nostril once daily  Patient Instructions: 1)  3 months Prescriptions: FLUTICASONE PROPIONATE 50 MCG/ACT  SUSP (FLUTICASONE PROPIONATE) 2 sprays each nostril once daily  #1 vial x 3   Entered and Authorized by:   Birdie Sons MD   Signed by:   Birdie Sons MD on 09/16/2009   Method used:   Electronically to        [facility] [address] [phone]* (retail)       [address]       [place],  [place]  [address]       Ph: [phone] or [phone]       Fax: [phone]   RxID:   [id number] PREDNISONE 2.5 MG TABS (PREDNISONE) Take 1 tablet by mouth once a day for one month and then every other day for one month and then stop  #45 x 1   Entered and Authorized by:   Birdie Sons MD   Signed by:   Birdie Sons MD on 09/16/2009   Method used:   Electronically to        [facility] [address] [phone]* (retail)       [address]       [place], [place]  [address]       Ph: [phone] or [phone]       Fax: [phone]   RxID:   [id number]

## 2010-04-08 NOTE — Procedures (Signed)
Summary: [facility]   Allergies: 1)  ! * Antihistamines  PPM Specifications Following MD:  [name], MD     PPM Vendor:  [employer]     PPM Model Number:  [id number]     PPM Serial Number:  [id number] PPM DOI:  04/14/2005     PPM Implanting MD:  [name], MD  Lead 1    Location: [facility]     DOI: 09/12/1996     Model #: 1342T     Serial #: [id number]     Status: active Lead 2    Location: [facility]     DOI: 09/12/1996     Model #: 1346T     Serial #: [id number]     Status: active   Indications:  SND   PPM Follow Up Remote Check?  No Battery Voltage:  2.78 V     Battery Est. Longevity:  7.50 years     Pacer Dependent:  Yes       PPM Device Measurements Atrium  Amplitude: 0.2 mV, Impedance: 402 ohms, Threshold: 0.75 V at 0.4 msec Right Ventricle  Amplitude: 11.3 mV, Impedance: 453 ohms, Threshold: 0.875 V at 0.4 msec  Episodes MS Episodes:  2     Percent Mode Switch:  <1%     Coumadin:  Yes Atrial Pacing:  95%     Ventricular Pacing:  1.9%  Parameters Mode:  DDDR     Lower Rate Limit:  70     Upper Rate Limit:  115 Paced AV Delay:  250     Sensed AV Delay:  225 Tech Comments:  No parameter changes.  CHB today.  2 mode switch episodes since last cardioversion lasting 4-6 seconds.    Altha Harm, LPN  January 08, 2010 11:16 AM

## 2010-04-08 NOTE — Medication Information (Signed)
Summary: rov/ewj  Anticoagulant Therapy  Managed by: Eda Keys, PharmD Referring MD: [name] MD, Maisie Fus PCP: Birdie Sons, MD Supervising MD: Jens Som MD, Arlys John Indication 1: Atrial Fibrillation Lab Used: [facility] [place] Site: [address] INR POC 2.4 INR RANGE 2-3  Dietary changes: no    Health status changes: no    Bleeding/hemorrhagic complications: no    Recent/future hospitalizations: no    Any changes in medication regimen? no    Recent/future dental: no  Any missed doses?: no       Is patient compliant with meds? yes       Allergies: 1)  ! * Antihistamines  Anticoagulation Management History:      The patient is taking warfarin and comes in today for a routine follow up visit.  Positive risk factors for bleeding include an age of 41 years or older.  The bleeding index is 'intermediate risk'.  Positive CHADS2 values include Age > 74 years old.  Anticoagulation responsible provider: Jens Som MD, Arlys John.  INR POC: 2.4.  Cuvette Lot#: [id number].  Exp: 06/2010.    Anticoagulation Management Assessment/Plan:      The patient's current anticoagulation dose is Coumadin 5 mg tabs: As directed.  The target INR is 2 - 3.  The next INR is due 05/27/2009.  Anticoagulation instructions were given to patient.  Results were reviewed/authorized by Eda Keys, PharmD.  He was notified by Eda Keys.         Prior Anticoagulation Instructions: INR 2.9  Continue on same dosage 1 tablet daily except 1/2 tablet on Sundays, Tuesdays, and Thursdays.  Recheck in 3 weeks.    Current Anticoagulation Instructions: INR 2.4  Continue current dosing schedule.  Take 1 tablet on Monday, Wednesday, and Friday, and take 1/2 tablet all other days. Return to clinic in 4 weeks.

## 2010-04-08 NOTE — Medication Information (Signed)
Summary: rov/sp  Anticoagulant Therapy  Managed by: Weston Brass, PharmD Referring MD: [name] MD, Maisie Fus PCP: Birdie Sons, MD Supervising MD: Shirlee Latch MD, Maksym Pfiffner Indication 1: Atrial Fibrillation Lab Used: [facility] [place] Site: [address] INR POC 2.2 INR RANGE 2-3  Dietary changes: no    Health status changes: no    Bleeding/hemorrhagic complications: no    Recent/future hospitalizations: no    Any changes in medication regimen? no    Recent/future dental: no  Any missed doses?: no       Is patient compliant with meds? yes       Allergies: 1)  ! * Antihistamines  Anticoagulation Management History:      The patient is taking warfarin and comes in today for a routine follow up visit.  Positive risk factors for bleeding include an age of 75 years or older.  The bleeding index is 'intermediate risk'.  Positive CHADS2 values include Age > 35 years old.  Anticoagulation responsible provider: Shirlee Latch MD, Derreck Wiltsey.  INR POC: 2.2.  Cuvette Lot#: [id number].  Exp: 12/2010.    Anticoagulation Management Assessment/Plan:      The patient's current anticoagulation dose is Coumadin 5 mg tabs: As directed.  The target INR is 2 - 3.  The next INR is due 10/24/2009.  Anticoagulation instructions were given to patient.  Results were reviewed/authorized by Weston Brass, PharmD.  He was notified by Weston Brass PharmD.         Prior Anticoagulation Instructions: INR 2.0  Take 1 tablet today then resume same dose of 1 tablet every day except 1/2 tablet on Sunday, Tuesday and Thursday.   Current Anticoagulation Instructions: INR 2.2  Continue same dose of 1 tablet every day except 1/2 tablet on Sunday, Tuesday and Thursday.

## 2010-04-08 NOTE — Cardiovascular Report (Signed)
Summary: TTM   TTM   Imported By: Roderic Ovens 11/25/2009 11:08:30  _____________________________________________________________________  External Attachment:    Type:   Image     Comment:   External Document

## 2010-04-08 NOTE — Medication Information (Signed)
Summary: rov/tm  Anticoagulant Therapy  Managed by: Weston Brass, PharmD Referring MD: [name] MD, Maisie Fus PCP: Birdie Sons, MD Supervising MD: Clifton James MD, Cristal Deer Indication 1: Atrial Fibrillation Lab Used: [facility] [place] Site: [address] INR POC 2.3 INR RANGE 2-3  Dietary changes: no    Health status changes: no    Bleeding/hemorrhagic complications: no    Recent/future hospitalizations: no    Any changes in medication regimen? yes       Details: decreased prednisone from 10mg  to 7.5mg    Recent/future dental: no  Any missed doses?: no       Is patient compliant with meds? yes       Allergies: 1)  ! * Antihistamines  Anticoagulation Management History:      The patient is taking warfarin and comes in today for a routine follow up visit.  Positive risk factors for bleeding include an age of 75 years or older.  The bleeding index is 'intermediate risk'.  Positive CHADS2 values include Age > 48 years old.  Anticoagulation responsible provider: Clifton James MD, Cristal Deer.  INR POC: 2.3.  Cuvette Lot#: [id number].  Exp: 07/2010.    Anticoagulation Management Assessment/Plan:      The patient's current anticoagulation dose is Coumadin 5 mg tabs: As directed.  The target INR is 2 - 3.  The next INR is due 07/11/2009.  Anticoagulation instructions were given to patient.  Results were reviewed/authorized by Weston Brass, PharmD.  He was notified by Weston Brass PharmD.         Prior Anticoagulation Instructions: INR 1.9 Today take 7.5mg s then  Change dose to 5mg s Everyday except 2.5mg s on Tuesdays, Thursdays and Sundays. Rehceck in 3 weeks.   Current Anticoagulation Instructions: INR 2.3  Continue same dose of 1 tablet every day except 1/2 tablet on Sunday, Tuesday, and Thursday

## 2010-04-08 NOTE — Cardiovascular Report (Signed)
Summary: Transtelephonic Pacemaker Monitoring Report  Transtelephonic Pacemaker Monitoring Report   Imported By: Debby Freiberg 09/04/2009 14:15:18  _____________________________________________________________________  External Attachment:    Type:   Image     Comment:   External Document

## 2010-04-08 NOTE — Cardiovascular Report (Signed)
Summary: Office Visit   Office Visit   Imported By: Roderic Ovens 12/06/2009 12:50:08  _____________________________________________________________________  External Attachment:    Type:   Image     Comment:   External Document

## 2010-04-08 NOTE — Cardiovascular Report (Signed)
Summary: Office Visit   Office Visit   Imported By: Roderic Ovens 01/22/2010 16:47:06  _____________________________________________________________________  External Attachment:    Type:   Image     Comment:   External Document

## 2010-04-08 NOTE — Assessment & Plan Note (Signed)
Summary: F2W/S/P DCCV   Visit Type:  Follow-up Primary Provider:  Birdie Sons, MD  CC:  No cardiac complants.  History of Present Illness: Feeling much better since TEE guided cardioversion.  EF was down with his echo, but echo in NSR a few weeks earlier was much better.  I think he feels worse when he goes into atrial fibrillation.  Now doing better.  Current Medications (verified): 1)  Coreg 12.5 Mg Tabs (Carvedilol) .... Take 1 Tablet By Mouth Two Times A Day 2)  Coumadin 5 Mg Tabs (Warfarin Sodium) .... As Directed 3)  Tikosyn 500 Mcg Caps (Dofetilide) .... Take 1 Tablet By Mouth Two Times A Day 4)  Digoxin 0.25 Mg Tabs (Digoxin) .... Take 1 Tablet By Mouth Once A Day 5)  Multivitamins   Tabs (Multiple Vitamin) .... Once Daily 6)  Pantoprazole Sodium 40 Mg  Tbec (Pantoprazole Sodium) .... Take 1 Tablet By Mouth Once A Day 7)  Simvastatin 40 Mg Tabs (Simvastatin) .... Take One Tablet At Bedtime 8)  Prednisone 2.5 Mg Tabs (Prednisone) .... Take One Tablet By Mouth Every Other Day For One Month. and Then 1/2 Every Other Day For One Month and Then Stop 9)  Avodart 0.5 Mg Caps (Dutasteride) .... Once Daily 10)  Fluticasone Propionate 50 Mcg/act  Susp (Fluticasone Propionate) .... 2 Sprays Each Nostril Once Daily  Allergies: 1)  ! * Antihistamines  Vital Signs:  Patient profile:   77 year old male Height:      68.5 inches Weight:      173.75 pounds BMI:     26.13 Pulse rate:   70 / minute Pulse rhythm:   irregular Resp:     18 per minute BP sitting:   106 / 60  (left arm) Cuff size:   large  Vitals Entered By: Vikki Ports (January 08, 2010 10:36 AM)  Physical Exam  General:  Well developed, well nourished, in no acute distress. Head:  normocephalic and atraumatic Eyes:  PERRLA/EOM intact; conjunctiva and lids normal. Lungs:  Clear bilaterally to auscultation and percussion. Heart:  Regular rhythm without murmur. Pulses:  pulses normal in all 4  extremities Extremities:  No clubbing or cyanosis. Neurologic:  Alert and oriented x 3.   EKG  Procedure date:  12/27/2009  Findings:      Study Conclusions    - Left ventricle: Systolic function was moderately reduced. The     estimated ejection fraction was in the range of 35% to 40%.   - Aortic valve: No evidence of vegetation. Trivial regurgitation.   - Mitral valve: Mild to moderate regurgitation.   - Left atrium: No evidence of thrombus in the atrial cavity or     appendage.   - Atrial septum: There was a patent foramen ovale. There was a     right-to-left shunt.  Echocardiogram  Procedure date:  12/09/2009  Findings:       Study Conclusions    - Left ventricle: There is significant hypokinesis of the posterior     wall, The EF is 45-50%, The cavity size was mildly to moderately     dilated. Wall thickness was increased in a pattern of mild LVH.   - Mitral valve: Mild to moderate regurgitation.   - Left atrium: The atrium was mildly dilated.   - Right ventricle: The cavity size was mildly dilated. Systolic     function was mildly reduced.   - Right atrium: The atrium was mildly dilated.  EKG  Procedure date:  12/09/2009  Findings:      Atrial pacing.  Otherwise no acute changes.   PPM Specifications Following MD:  [name], MD     PPM Vendor:  [employer]     PPM Model Number:  [id number]     PPM Serial Number:  [id number] PPM DOI:  04/14/2005     PPM Implanting MD:  [name], MD  Lead 1    Location: [facility]     DOI: 09/12/1996     Model #: 1342T     Serial #: [id number]     Status: active Lead 2    Location: [facility]     DOI: 09/12/1996     Model #: 1346T     Serial #: [id number]     Status: active   Indications:  SND   PPM Follow Up Pacer Dependent:  Yes      Episodes Coumadin:  Yes  Parameters Mode:  DDDR     Lower Rate Limit:  70     Upper Rate Limit:  115 Paced AV Delay:  250     Sensed AV Delay:  225  Impression & Recommendations:  Problem # 1:  ATRIAL  FIBRILLATION (ICD-427.31) Underwent TEE guided cardioversion and now back in NSR.  Doing well.  Feels good.  EF was down, but he was doing better when he is in NSR from an echo perspective.  See reports.  Will see back in six weeks, reassess, and if in NSR, may eventually repeat echocardiogram. His updated medication list for this problem includes:    Coreg 12.5 Mg Tabs (Carvedilol) .Marland Kitchen... Take 1 tablet by mouth two times a day    Coumadin 5 Mg Tabs (Warfarin sodium) .Marland Kitchen... As directed    Tikosyn 500 Mcg Caps (Dofetilide) .Marland Kitchen... Take 1 tablet by mouth two times a day    Digoxin 0.25 Mg Tabs (Digoxin) .Marland Kitchen... Take 1 tablet by mouth once a day  Orders: EKG w/ Interpretation (93000)  Problem # 2:  CARDIOMYOPATHY, PRIMARY-NONISCHEMIC EF 45% (ICD-425.4) Overall doing well.  Is bettter when in NSR.  His updated medication list for this problem includes:    Coreg 12.5 Mg Tabs (Carvedilol) .Marland Kitchen... Take 1 tablet by mouth two times a day    Coumadin 5 Mg Tabs (Warfarin sodium) .Marland Kitchen... As directed    Tikosyn 500 Mcg Caps (Dofetilide) .Marland Kitchen... Take 1 tablet by mouth two times a day    Digoxin 0.25 Mg Tabs (Digoxin) .Marland Kitchen... Take 1 tablet by mouth once a day  Problem # 3:  CAD, UNSPECIFIED SITE (ICD-414.00) Mild.  Not on ASA because of warfarin. His updated medication list for this problem includes:    Coreg 12.5 Mg Tabs (Carvedilol) .Marland Kitchen... Take 1 tablet by mouth two times a day    Coumadin 5 Mg Tabs (Warfarin sodium) .Marland Kitchen... As directed  Orders: EKG w/ Interpretation (93000)  Problem # 4:  HYPERLIPIDEMIA (ICD-272.4) tolerating meds at this point. His updated medication list for this problem includes:    Simvastatin 40 Mg Tabs (Simvastatin) .Marland Kitchen... Take one tablet at bedtime  Patient Instructions: 1)  Your physician recommends that you schedule a follow-up appointment in: 6 WEEKS 2)  Your physician recommends that you continue on your current medications as directed. Please refer to the Current Medication list  given to you today.

## 2010-04-08 NOTE — Medication Information (Signed)
Summary: rov/eac  Anticoagulant Therapy  Managed by: Weston Brass, PharmD Referring MD: [name] MD, Maisie Fus PCP: Birdie Sons, MD Supervising MD: Tenny Craw MD, Gunnar Fusi Indication 1: Atrial Fibrillation Lab Used: [facility] [place] Site: [address] INR POC 2.0 INR RANGE 2-3  Dietary changes: no    Health status changes: no    Bleeding/hemorrhagic complications: no    Recent/future hospitalizations: no    Any changes in medication regimen? no    Recent/future dental: no  Any missed doses?: no       Is patient compliant with meds? yes       Allergies: 1)  ! * Antihistamines  Anticoagulation Management History:      The patient is taking warfarin and comes in today for a routine follow up visit.  Positive risk factors for bleeding include an age of 61 years or older.  The bleeding index is 'intermediate risk'.  Positive CHADS2 values include Age > 76 years old.  Anticoagulation responsible provider: Tenny Craw MD, Gunnar Fusi.  INR POC: 2.0.  Cuvette Lot#: [id number].  Exp: 10/2010.    Anticoagulation Management Assessment/Plan:      The patient's current anticoagulation dose is Coumadin 5 mg tabs: As directed.  The target INR is 2 - 3.  The next INR is due 08/29/2009.  Anticoagulation instructions were given to patient.  Results were reviewed/authorized by Weston Brass, PharmD.  He was notified by Weston Brass PharmD.         Prior Anticoagulation Instructions: INR 1.9  Take 1 tablet today.  Then return to normal dosing schedule of 1/2 tablet on Sunday, Tuesday, and Thursday, and 1 tablet all other days.  Return to clinic in 3 weeks.    Current Anticoagulation Instructions: INR 2.0  Continue same dose of 1 tablet every day except 1/2 tablet on Sunday, Tuesday and Thursday

## 2010-04-08 NOTE — Medication Information (Signed)
Summary: rov/sp  Anticoagulant Therapy  Managed by: Cloyde Reams, RN, BSN Referring MD: [name] MD, Maisie Fus PCP: Birdie Sons, MD Supervising MD: Clifton James MD, Cristal Deer Indication 1: Atrial Fibrillation Lab Used: [facility] [place] Site: [address] INR POC 2.5 INR RANGE 2-3  Dietary changes: no    Health status changes: no    Bleeding/hemorrhagic complications: no    Recent/future hospitalizations: no    Any changes in medication regimen? yes       Details: Topical cream for a rash, completed.  Recent/future dental: no  Any missed doses?: no       Is patient compliant with meds? yes       Allergies: 1)  ! * Antihistamines  Anticoagulation Management History:      The patient is taking warfarin and comes in today for a routine follow up visit.  Positive risk factors for bleeding include an age of 88 years or older.  The bleeding index is 'intermediate risk'.  Positive CHADS2 values include Age > 70 years old.  Anticoagulation responsible provider: Clifton James MD, Cristal Deer.  INR POC: 2.5.  Cuvette Lot#: [id number].  Exp: 12/2010.    Anticoagulation Management Assessment/Plan:      The patient's current anticoagulation dose is Coumadin 5 mg tabs: As directed.  The target INR is 2 - 3.  The next INR is due 11/26/2009.  Anticoagulation instructions were given to patient.  Results were reviewed/authorized by Cloyde Reams, RN, BSN.  He was notified by Cloyde Reams RN.         Prior Anticoagulation Instructions: INR 2.2  Continue same dose of 1 tablet every day except 1/2 tablet on Sunday, Tuesday and Thursday.   Current Anticoagulation Instructions: INR 2.5  Continue on same dosage 1 tablet daily except 1/2 tablet on Sundays, Tuesdays, and Thursdays.  Recheck in 4 weeks.

## 2010-04-08 NOTE — Progress Notes (Signed)
Summary: refill  Phone Note Refill Request Call back at Work Phone [phone] Message from:  Patient  Refills Requested: Medication #1:  TIKOSYN 500 MCG CAPS Take 1 tablet by mouth two times a day   Supply Requested: 3 months [facility] needs refill soon, almost out of meds   Method Requested: Electronic Initial call taken by: Burnard Leigh,  March 13, 2009 10:15 AM  Follow-up for Phone Call       Follow-up by: Judithe Modest CMA,  March 13, 2009 2:27 PM    Prescriptions: TIKOSYN 500 MCG CAPS (DOFETILIDE) Take 1 tablet by mouth two times a day  #180 x 0   Entered by:   Judithe Modest CMA   Authorized by:   Laren Boom, MD, [facility]   Signed by:   Judithe Modest CMA on 03/13/2009   Method used:   Electronically to        [facility]* (mail-order)             ,          Ph: [phone]       Fax: [phone]   RxID:   [id number]

## 2010-04-08 NOTE — Cardiovascular Report (Signed)
Summary: Office Visit   Office Visit   Imported By: Roderic Ovens 06/06/2009 11:12:59  _____________________________________________________________________  External Attachment:    Type:   Image     Comment:   External Document

## 2010-04-08 NOTE — Assessment & Plan Note (Signed)
Summary: 2 month fup//ccm----PT RSC (BMP) // RS   Vital Signs:  Patient profile:   77 year old male Weight:      174 pounds Temp:     98.4 degrees F oral Pulse rate:   76 / minute Pulse rhythm:   irregular Resp:     12 per minute BP sitting:   120 / 62  (left arm) Cuff size:   regular  Vitals Entered By: Gladis Riffle, RN (August 08, 2009 12:14 PM) CC: 2 month rov Is Patient Diabetic? No   CC:  2 month rov.  Preventive Screening-Counseling & Management  Alcohol-Tobacco     Smoking Status: quit > 6 months     Year Started: 1949     Year Quit: 1985  Current Medications (verified): 1)  Coreg 12.5 Mg Tabs (Carvedilol) .... Take 1 Tablet By Mouth Two Times A Day 2)  Coumadin 5 Mg Tabs (Warfarin Sodium) .... As Directed 3)  Tikosyn 500 Mcg Caps (Dofetilide) .... Take 1 Tablet By Mouth Two Times A Day 4)  Digoxin 0.25 Mg Tabs (Digoxin) .... Take 1 Tablet By Mouth Once A Day 5)  Multivitamins   Tabs (Multiple Vitamin) .... Once Daily 6)  Pantoprazole Sodium 40 Mg  Tbec (Pantoprazole Sodium) .... Take 1 Tablet By Mouth Once A Day 7)  Simvastatin 40 Mg Tabs (Simvastatin) .... Take One Tablet At Bedtime 8)  Prednisone 5 Mg Tabs (Prednisone) .... Take 1  Tab By Mouth Once Daily 9)  Finasteride 5 Mg Tabs (Finasteride) .... Take 1 Tablet By Mouth Once A Day  Allergies: 1)  ! * Antihistamines  Physical Exam  General:  alert and well-developed.   Head:  normocephalic and atraumatic.   Eyes:  pupils equal and pupils round.   Neck:  No deformities, masses, or tenderness noted. Chest Wall:  No deformities, masses, tenderness or gynecomastia noted. Lungs:  normal respiratory effort and no intercostal retractions.   Msk:  no rash, no joint swelling   Impression & Recommendations:  Problem # 1:  POLYMYALGIA RHEUMATICA (ICD-725) Prednisone 5 mg by mouth once daily....currenlty  Problem # 2:  BACK PAIN (ICD-724.5) stretching and exercises explained  Complete Medication List: 1)   Coreg 12.5 Mg Tabs (Carvedilol) .... Take 1 tablet by mouth two times a day 2)  Coumadin 5 Mg Tabs (Warfarin sodium) .... As directed 3)  Tikosyn 500 Mcg Caps (Dofetilide) .... Take 1 tablet by mouth two times a day 4)  Digoxin 0.25 Mg Tabs (Digoxin) .... Take 1 tablet by mouth once a day 5)  Multivitamins Tabs (Multiple vitamin) .... Once daily 6)  Pantoprazole Sodium 40 Mg Tbec (Pantoprazole sodium) .... Take 1 tablet by mouth once a day 7)  Simvastatin 40 Mg Tabs (Simvastatin) .... Take one tablet at bedtime 8)  Prednisone 5 Mg Tabs (Prednisone) .... Take one tablet by mouth every other day alternating with 1/2 tab by mouth every other day 9)  Finasteride 5 Mg Tabs (Finasteride) .... Take 1 tablet by mouth once a day  Patient Instructions: 1)  Please schedule a follow-up appointment in 1 month.

## 2010-04-08 NOTE — Medication Information (Signed)
Summary: rov/sp  Anticoagulant Therapy  Managed by: Bethena Midget, RN, BSN Referring MD: [name] MD, Maisie Fus PCP: Birdie Sons, MD Supervising MD: Eden Emms MD, Theron Arista Indication 1: Atrial Fibrillation Lab Used: [facility] [place] Site: [address] INR POC 3.1 INR RANGE 2-3  Dietary changes: no    Health status changes: yes       Details: SOB  Bleeding/hemorrhagic complications: no    Recent/future hospitalizations: no    Any changes in medication regimen? no    Recent/future dental: no  Any missed doses?: no       Is patient compliant with meds? yes      Comments: Pending TEE and DCCV tomorrow  Allergies: 1)  ! * Antihistamines  Anticoagulation Management History:      The patient is taking warfarin and comes in today for a routine follow up visit.  Positive risk factors for bleeding include an age of 43 years or older.  The bleeding index is 'intermediate risk'.  Positive CHADS2 values include Age > 31 years old.  His last INR was 1.7 ratio.  Anticoagulation responsible Tkai Large: Eden Emms MD, Theron Arista.  INR POC: 3.1.  Cuvette Lot#: [id number].  Exp: 02/2011.    Anticoagulation Management Assessment/Plan:      The patient's current anticoagulation dose is Coumadin 5 mg tabs: As directed.  The target INR is 2 - 3.  The next INR is due 01/06/2010.  Anticoagulation instructions were given to patient.  Results were reviewed/authorized by Bethena Midget, RN, BSN.  He was notified by Bethena Midget, RN, BSN.         Prior Anticoagulation Instructions: INR 1.7  Attempted to contact pt, lmom to take 10mg  today, then resume same dosage 5mg  daily except 2.5mg  on Sundays, Tuesdays, and Thursdays.  Pending DCCV tomorrow, will await decision as to TEE DCCV or post-pone.   Spoke with pt.  He took 10mg  last night.  Resume normal dose today.  Recheck INR on 10/20. Weston Brass PharmD  December 24, 2009 10:36 AM   Current Anticoagulation Instructions: INR 3.1 Today take 1mg  then resume  5mg s daily except 2.5mg s on Tuesdays, Thursdays and Sundays. Recheck in one week.  Sample 1mg   given Exp 04/2010 Lot # [id number]

## 2010-04-08 NOTE — Medication Information (Signed)
Summary: rov/eac  Anticoagulant Therapy  Managed by: Bethena Midget, RN, BSN Referring MD: [name] MD, Maisie Fus PCP: Birdie Sons, MD Supervising MD: Shirlee Latch MD, Maliq Pilley Indication 1: Atrial Fibrillation Lab Used: [facility] [place] Site: [address] INR POC 1.9 INR RANGE 2-3  Dietary changes: no    Health status changes: no    Bleeding/hemorrhagic complications: no    Recent/future hospitalizations: no    Any changes in medication regimen? no    Recent/future dental: no  Any missed doses?: no       Is patient compliant with meds? yes      Comments: Pt. took 2.5mg s daily except 5mg s MWF  Allergies: 1)  ! * Antihistamines  Anticoagulation Management History:      The patient is taking warfarin and comes in today for a routine follow up visit.  Positive risk factors for bleeding include an age of 46 years or older.  The bleeding index is 'intermediate risk'.  Positive CHADS2 values include Age > 26 years old.  Anticoagulation responsible provider: Shirlee Latch MD, Denilson Salminen.  INR POC: 1.9.  Cuvette Lot#: [id number].  Exp: 07/2010.    Anticoagulation Management Assessment/Plan:      The patient's current anticoagulation dose is Coumadin 5 mg tabs: As directed.  The target INR is 2 - 3.  The next INR is due 06/17/2009.  Anticoagulation instructions were given to patient.  Results were reviewed/authorized by Bethena Midget, RN, BSN.  He was notified by Bethena Midget, RN, BSN.         Prior Anticoagulation Instructions: INR 2.4  Continue current dosing schedule.  Take 1 tablet on Monday, Wednesday, and Friday, and take 1/2 tablet all other days. Return to clinic in 4 weeks.  Current Anticoagulation Instructions: INR 1.9 Today take 7.5mg s then  Change dose to 5mg s Everyday except 2.5mg s on Tuesdays, Thursdays and Sundays. Rehceck in 3 weeks.

## 2010-04-08 NOTE — Assessment & Plan Note (Signed)
Summary: 3 month fup//ccm   Vital Signs:  Patient profile:   77 year old male Weight:      172 pounds Temp:     97.6 degrees F oral BP sitting:   100 / 70  (left arm) Cuff size:   regular  Vitals Entered By: Sid Falcon LPN (December 16, 2009 8:01 AM)  Primary Care Librada Castronovo:  Birdie Sons, MD   History of Present Illness: PMR---doing well no complaints forgot to decrease dose of prednisone  AFIB---no sxs getting protime checked monthly  BPH---nocturia 0-3 but generally doing well.   GERD---no sxs has been taking PPI long term  All other systems reviewed and were negative   Current Medications (verified): 1)  Coreg 12.5 Mg Tabs (Carvedilol) .... Take 1 Tablet By Mouth Two Times A Day 2)  Coumadin 5 Mg Tabs (Warfarin Sodium) .... As Directed 3)  Tikosyn 500 Mcg Caps (Dofetilide) .... Take 1 Tablet By Mouth Two Times A Day 4)  Digoxin 0.25 Mg Tabs (Digoxin) .... Take 1 Tablet By Mouth Once A Day 5)  Multivitamins   Tabs (Multiple Vitamin) .... Once Daily 6)  Pantoprazole Sodium 40 Mg  Tbec (Pantoprazole Sodium) .... Take 1 Tablet By Mouth Once A Day 7)  Simvastatin 40 Mg Tabs (Simvastatin) .... Take One Tablet At Bedtime 8)  Prednisone 2.5 Mg Tabs (Prednisone) .... Take 1 Tablet By Mouth Once A Day For One Month and Then Every Other Day For One Month and Then Stop 9)  Avodart 0.5 Mg Caps (Dutasteride) .... Once Daily 10)  Fluticasone Propionate 50 Mcg/act  Susp (Fluticasone Propionate) .... 2 Sprays Each Nostril Once Daily  Allergies: 1)  ! * Antihistamines  Physical Exam  General:  alert and well-developed.   Head:  normocephalic and atraumatic.   Eyes:  pupils equal and pupils round.   Ears:  R ear normal and L ear normal.   Neck:  No deformities, masses, or tenderness noted. Lungs:  normal respiratory effort and no intercostal retractions.   Heart:  normal rate and irregular rhythm.   Abdomen:  soft and non-tender.   Skin:  turgor normal and color normal.      Impression & Recommendations:  Problem # 1:  POLYMYALGIA RHEUMATICA (ICD-725) decrease prednsone side effects discussed  Problem # 2:  ATRIAL FIBRILLATION (ICD-427.31) controlled has regular CV f/u His updated medication list for this problem includes:    Coreg 12.5 Mg Tabs (Carvedilol) .Marland Kitchen... Take 1 tablet by mouth two times a day    Coumadin 5 Mg Tabs (Warfarin sodium) .Marland Kitchen... As directed    Tikosyn 500 Mcg Caps (Dofetilide) .Marland Kitchen... Take 1 tablet by mouth two times a day    Digoxin 0.25 Mg Tabs (Digoxin) .Marland Kitchen... Take 1 tablet by mouth once a day  Problem # 3:  GERD (ICD-530.81) try to taper pantoprazole His updated medication list for this problem includes:    Pantoprazole Sodium 40 Mg Tbec (Pantoprazole sodium) .Marland Kitchen... Take 1 tablet by mouth once a day  Complete Medication List: 1)  Coreg 12.5 Mg Tabs (Carvedilol) .... Take 1 tablet by mouth two times a day 2)  Coumadin 5 Mg Tabs (Warfarin sodium) .... As directed 3)  Tikosyn 500 Mcg Caps (Dofetilide) .... Take 1 tablet by mouth two times a day 4)  Digoxin 0.25 Mg Tabs (Digoxin) .... Take 1 tablet by mouth once a day 5)  Multivitamins Tabs (Multiple vitamin) .... Once daily 6)  Pantoprazole Sodium 40 Mg Tbec (Pantoprazole sodium) .... Take  1 tablet by mouth once a day 7)  Simvastatin 40 Mg Tabs (Simvastatin) .... Take one tablet at bedtime 8)  Prednisone 2.5 Mg Tabs (Prednisone) .... Take one tablet by mouth every other day for one month. and then 1/2 every other day for one month and then stop 9)  Avodart 0.5 Mg Caps (Dutasteride) .... Once daily 10)  Fluticasone Propionate 50 Mcg/act Susp (Fluticasone propionate) .... 2 sprays each nostril once daily  Other Orders: Flu Vaccine 38yrs + MEDICARE PATIENTS (O9629) Administration Flu vaccine - MCR (B2841)  Patient Instructions: 1)  Please schedule a follow-up appointment in 3 months. 2)  Trial: panoprazole every other day. if recurrent reflux start taking every day.   Prescriptions: PREDNISONE 2.5 MG TABS (PREDNISONE) Take one tablet by mouth every other day for one month. and then 1/2 every other day for one month and then stop  #60 x 0   Entered and Authorized by:   Birdie Sons MD   Signed by:   Birdie Sons MD on 12/16/2009   Method used:   Electronically to        [facility]  [address] [phone]* (retail)       [address]       [place], [place]  [address]       Ph: [phone] or [phone]       Fax: [phone]   RxID:   [id number]    Flu Vaccine Consent Questions     Do you have a history of severe allergic reactions to this vaccine? no    Any prior history of allergic reactions to egg and/or gelatin? no    Do you have a sensitivity to the preservative Thimersol? no    Do you have a past history of Guillan-Barre Syndrome? no    Do you currently have an acute febrile illness? no    Have you ever had a severe reaction to latex? no    Vaccine information given and explained to patient? yes    Are you currently pregnant? no    Lot Number:[id number]   Exp Date:09/06/2010   Site Given  Left Deltoid IMdflu

## 2010-04-08 NOTE — Medication Information (Signed)
Summary: rov/sp  Anticoagulant Therapy  Managed by: Weston Brass, PharmD Referring MD: [name] MD, Maisie Fus PCP: Birdie Sons, MD Supervising MD: Shirlee Latch MD, Waldo Damian Indication 1: Atrial Fibrillation Lab Used: [facility] [place] Site: [address] INR POC 2.0 INR RANGE 2-3  Dietary changes: no    Health status changes: no    Bleeding/hemorrhagic complications: no    Recent/future hospitalizations: no    Any changes in medication regimen? no    Recent/future dental: no  Any missed doses?: no       Is patient compliant with meds? yes       Allergies: 1)  ! * Antihistamines  Anticoagulation Management History:      The patient is taking warfarin and comes in today for a routine follow up visit.  Positive risk factors for bleeding include an age of 75 years or older.  The bleeding index is 'intermediate risk'.  Positive CHADS2 values include Age > 75 years old.  Anticoagulation responsible provider: Shirlee Latch MD, Burhanuddin Kohlmann.  INR POC: 2.0.  Cuvette Lot#: [id number].  Exp: 11/2010.    Anticoagulation Management Assessment/Plan:      The patient's current anticoagulation dose is Coumadin 5 mg tabs: As directed.  The target INR is 2 - 3.  The next INR is due 09/26/2009.  Anticoagulation instructions were given to patient.  Results were reviewed/authorized by Weston Brass, PharmD.  He was notified by Weston Brass PharmD.         Prior Anticoagulation Instructions: INR 2.0  Continue same dose of 1 tablet every day except 1/2 tablet on Sunday, Tuesday and Thursday  Current Anticoagulation Instructions: INR 2.0  Take 1 tablet today then resume same dose of 1 tablet every day except 1/2 tablet on Sunday, Tuesday and Thursday.

## 2010-04-09 ENCOUNTER — Encounter: Payer: Self-pay | Admitting: Internal Medicine

## 2010-04-09 DIAGNOSIS — I495 Sick sinus syndrome: Secondary | ICD-10-CM

## 2010-04-10 NOTE — Medication Information (Signed)
Summary: rov/nb  Anticoagulant Therapy  Managed by: Weston Brass, PharmD Referring MD: [name] MD, Maisie Fus PCP: Birdie Sons, MD Supervising MD: Jens Som MD, Arlys John Indication 1: Atrial Fibrillation Lab Used: [facility] [place] Site: [address] INR POC 2.2 INR RANGE 2-3  Dietary changes: no    Health status changes: no    Bleeding/hemorrhagic complications: no    Recent/future hospitalizations: no    Any changes in medication regimen? yes       Details: finished prednisone taper (had been tapering over several months)  Recent/future dental: no  Any missed doses?: no       Is patient compliant with meds? yes       Allergies: 1)  ! * Antihistamines  Anticoagulation Management History:      The patient is taking warfarin and comes in today for a routine follow up visit.  Positive risk factors for bleeding include an age of 75 years or older.  The bleeding index is 'intermediate risk'.  Positive CHADS2 values include Age > 65 years old.  His last INR was 1.7 ratio.  Anticoagulation responsible provider: Jens Som MD, Arlys John.  INR POC: 2.2.  Cuvette Lot#: [id number].  Exp: 03/2011.    Anticoagulation Management Assessment/Plan:      The patient's current anticoagulation dose is Coumadin 5 mg tabs: As directed.  The target INR is 2 - 3.  The next INR is due 04/02/2010.  Anticoagulation instructions were given to patient.  Results were reviewed/authorized by Weston Brass, PharmD.  He was notified by Weston Brass PharmD.         Prior Anticoagulation Instructions: INR 2.2 Continue previous dose of 1 tablet everyday except 0.5 tablet on Tuesday, Thursday, and Saturday Recheck INR in 4 weeks  Current Anticoagulation Instructions: INR 2.2  Continue same dose of 1 tablet every day except 1/2 tablet on Tuesday, Thursday and Saturday.  Recheck INR in 4 weeks.

## 2010-04-10 NOTE — Assessment & Plan Note (Signed)
Summary: f6w   Primary Provider:  Birdie Sons, MD   History of Present Illness: Overall doing well.  Feeling really quite good.  Denies chest pain.  No shortness of breath.  No major complaints. Does not feel an echo is needed at this time.    Current Problems (verified): 1)  Back Pain  (ICD-724.5) 2)  Spinal Stenosis  (ICD-724.00) 3)  Cardiac Pacemaker in Situ  (ICD-V45.01) 4)  Sick Sinus Syndrome  (ICD-427.81) 5)  Cad, Unspecified Site  (ICD-414.00) 6)  Atrial Fibrillation  (ICD-427.31) 7)  Coumadin Therapy  (ICD-V58.61) 8)  Cardiomyopathy, Primary-nonischemic Ef 45%  (ICD-425.4) 9)  Hyperlipidemia  (ICD-272.4) 10)  Gerd  (ICD-530.81) 11)  Polymyalgia Rheumatica  (ICD-725) 12)  Mixed Hearing Loss Bilateral  (ICD-389.22) 13)  Psa, Increased  (ICD-790.93) 14)  Back Pain, Lumbar  (ICD-724.2) 15)  Benign Prostatic Hypertrophy  (ICD-600.00) 16)  Breast Mass  (ICD-611.72)  Current Medications (verified): 1)  Coreg 12.5 Mg Tabs (Carvedilol) .... Take 1 Tablet By Mouth Two Times A Day 2)  Coumadin 5 Mg Tabs (Warfarin Sodium) .... As Directed 3)  Tikosyn 500 Mcg Caps (Dofetilide) .... Take 1 Tablet By Mouth Two Times A Day 4)  Digoxin 0.25 Mg Tabs (Digoxin) .... Take 1 Tablet By Mouth Once A Day 5)  Multivitamins   Tabs (Multiple Vitamin) .... Once Daily 6)  Pantoprazole Sodium 40 Mg  Tbec (Pantoprazole Sodium) .... Take 1 Tablet By Mouth Qod 7)  Simvastatin 40 Mg Tabs (Simvastatin) .... Take One Tablet At Bedtime 8)  Avodart 0.5 Mg Caps (Dutasteride) .... Once Daily  Allergies (verified): 1)  ! * Antihistamines  Vital Signs:  Patient profile:   77 year old male Height:      68.5 inches Weight:      171 pounds BMI:     25.71 Pulse rate:   70 / minute Resp:     16 per minute BP sitting:   130 / 88  (right arm)  Vitals Entered By: Marrion Coy, CNA (February 18, 2010 1:50 PM)  Physical Exam  General:  Well developed, well nourished, in no acute distress. Head:   normocephalic and atraumatic Eyes:  PERRLA/EOM intact; conjunctiva and lids normal. Lungs:  Clear bilaterally to auscultation and percussion. Heart:  PMI non displaced.  Normal S1 and S2.  S4.   Abdomen:  Bowel sounds positive; abdomen soft and non-tender without masses, organomegaly, or hernias noted. No hepatosplenomegaly. Pulses:  pulses normal in all 4 extremities Extremities:  No clubbing or cyanosis. Neurologic:  Alert and oriented x 3.   EKG  Procedure date:  02/18/2010  Findings:      Probable av pacing versus a pacing and v tracking with IVCD.  Stable.   PPM Specifications Following MD:  [name], MD     PPM Vendor:  [employer]     PPM Model Number:  [id number]     PPM Serial Number:  [id number] PPM DOI:  04/14/2005     PPM Implanting MD:  [name], MD  Lead 1    Location: RA     DOI: 09/12/1996     Model #: 1342T     Serial #: [id number]     Status: active Lead 2    Location: RV     DOI: 09/12/1996     Model #: 1346T     Serial #: [id number]     Status: active   Indications:  SND   PPM Follow Up Pacer Dependent:  Yes      Episodes Coumadin:  Yes  Parameters Mode:  DDDR     Lower Rate Limit:  70     Upper Rate Limit:  115 Paced AV Delay:  250     Sensed AV Delay:  225  Impression & Recommendations:  Problem # 1:  SICK SINUS SYNDROME (ICD-427.81) currently a pacing with v tracking mostly, ECG unchanged--IVCD with left axis.  Non specific.  His updated medication list for this problem includes:    Coreg 12.5 Mg Tabs (Carvedilol) .Marland Kitchen... Take 1 tablet by mouth two times a day    Coumadin 5 Mg Tabs (Warfarin sodium) .Marland Kitchen... As directed    Tikosyn 500 Mcg Caps (Dofetilide) .Marland Kitchen... Take 1 tablet by mouth two times a day  Problem # 2:  ATRIAL FIBRILLATION (ICD-427.31) No recurrence and tolerating Tikosyn without difficulty.  Feels it helps alot.  His updated medication list for this problem includes:    Coreg 12.5 Mg Tabs (Carvedilol) .Marland Kitchen... Take 1 tablet by mouth two times a day     Coumadin 5 Mg Tabs (Warfarin sodium) .Marland Kitchen... As directed    Tikosyn 500 Mcg Caps (Dofetilide) .Marland Kitchen... Take 1 tablet by mouth two times a day    Digoxin 0.25 Mg Tabs (Digoxin) .Marland Kitchen... Take 1 tablet by mouth once a day  Orders: EKG w/ Interpretation (93000)  Problem # 3:  CARDIOMYOPATHY, PRIMARY-NONISCHEMIC EF 45% (ICD-425.4) Overall has been stable, down when in atrial fib.  He feels better and does not feel as though echo is needed at this time.  Will need to continue to watch closely. His updated medication list for this problem includes:    Coreg 12.5 Mg Tabs (Carvedilol) .Marland Kitchen... Take 1 tablet by mouth two times a day    Coumadin 5 Mg Tabs (Warfarin sodium) .Marland Kitchen... As directed    Tikosyn 500 Mcg Caps (Dofetilide) .Marland Kitchen... Take 1 tablet by mouth two times a day    Digoxin 0.25 Mg Tabs (Digoxin) .Marland Kitchen... Take 1 tablet by mouth once a day  Problem # 4:  CAD, UNSPECIFIED SITE (ICD-414.00) Mostly non obtructive.  Prevention management. His updated medication list for this problem includes:    Coreg 12.5 Mg Tabs (Carvedilol) .Marland Kitchen... Take 1 tablet by mouth two times a day    Coumadin 5 Mg Tabs (Warfarin sodium) .Marland Kitchen... As directed  Orders: EKG w/ Interpretation (93000)  Problem # 5:  HYPERLIPIDEMIA (ICD-272.4) Followed now by Dr. Cato Mulligan.  His updated medication list for this problem includes:    Simvastatin 40 Mg Tabs (Simvastatin) .Marland Kitchen... Take one tablet at bedtime  Patient Instructions: 1)  Your physician recommends that you continue on your current medications as directed. Please refer to the Current Medication list given to you today. 2)  Your physician wants you to follow-up in: 4 MONTHS.  You will receive a reminder letter in the mail two months in advance. If you don't receive a letter, please call our office to schedule the follow-up appointment.

## 2010-04-10 NOTE — Medication Information (Signed)
Summary: rov/sp  Anticoagulant Therapy  Managed by: Bethena Midget, RN, BSN Referring MD: [name] MD, Maisie Fus PCP: Birdie Sons, MD Supervising MD: Patty Sermons MD, Maisie Fus Indication 1: Atrial Fibrillation Lab Used: [facility] [place] Site: [address] INR POC 2.4 INR RANGE 2-3  Dietary changes: no    Health status changes: yes       Details: Pt states he went into Afib couple days ago, denies any CP, dizziness or swelling. Does verbalize some tiredness. States if it continues he will call his Cardiologist. DCCV was in Oct 2011. Will discuss these findings with his Cards nurse.   Bleeding/hemorrhagic complications: no    Recent/future hospitalizations: no    Any changes in medication regimen? yes       Details: Finished Prednisone approx 2 weeks ago   Recent/future dental: no  Any missed doses?: no       Is patient compliant with meds? yes      d  Current Medications (verified): 1)  Coreg 12.5 Mg Tabs (Carvedilol) .... Take 1 Tablet By Mouth Two Times A Day 2)  Coumadin 5 Mg Tabs (Warfarin Sodium) .... As Directed 3)  Tikosyn 500 Mcg Caps (Dofetilide) .... Take 1 Tablet By Mouth Two Times A Day 4)  Digoxin 0.25 Mg Tabs (Digoxin) .... Take 1 Tablet By Mouth Once A Day 5)  Multivitamins   Tabs (Multiple Vitamin) .... Once Daily 6)  Pantoprazole Sodium 40 Mg  Tbec (Pantoprazole Sodium) .... Take 1 Tablet By Mouth Qod 7)  Simvastatin 40 Mg Tabs (Simvastatin) .... Take One Tablet At Bedtime 8)  Finasteride 5 Mg Tabs (Finasteride) .... Take 1 Tablet Every Other Day  Allergies: 1)  ! * Antihistamines  Anticoagulation Management History:      The patient is taking warfarin and comes in today for a routine follow up visit.  Positive risk factors for bleeding include an age of 75 years or older.  The bleeding index is 'intermediate risk'.  Positive CHADS2 values include Age > 63 years old.  His last INR was 1.7 ratio.  Anticoagulation responsible provider: Patty Sermons MD,  Maisie Fus.  INR POC: 2.4.  Cuvette Lot#: [id number].  Exp: 03/2011.    Anticoagulation Management Assessment/Plan:      The patient's current anticoagulation dose is Coumadin 5 mg tabs: As directed.  The target INR is 2 - 3.  The next INR is due 04/30/2010.  Anticoagulation instructions were given to patient.  Results were reviewed/authorized by Bethena Midget, RN, BSN.  He was notified by Bethena Midget, RN, BSN.         Prior Anticoagulation Instructions: INR 2.2  Continue same dose of 1 tablet every day except 1/2 tablet on Tuesday, Thursday and Saturday.  Recheck INR in 4 weeks.   Current Anticoagulation Instructions: INR 2.4 Continue 1 pill everyday except 1/2 pill on Tuesdays, Thursdays and Saturdays.  Recheck in 4 weeks.

## 2010-04-10 NOTE — Progress Notes (Signed)
Summary: refill  Phone Note Refill Request Message from:  Patient---triage vm  Refills Requested: Medication #1:  COREG 12.5 MG TABS Take 1 tablet by mouth two times a day [facility]  Initial call taken by: Warnell Forester,  March 20, 2010 9:43 AM  Follow-up for Phone Call        Rx faxed to pharmacy Follow-up by: Alfred Levins, CMA,  March 20, 2010 10:19 AM    Prescriptions: COREG 12.5 MG TABS (CARVEDILOL) Take 1 tablet by mouth two times a day  #180 Tablet x 3   Entered by:   Alfred Levins, CMA   Authorized by:   Birdie Sons MD   Signed by:   Alfred Levins, CMA on 03/20/2010   Method used:   Electronically to        [facility]       [address]       [place], [place]  [address]       Ph: [phone]       Fax: [phone]   RxID:   [id number]   Appended Document: refill    Prescriptions: PANTOPRAZOLE SODIUM 40 MG  TBEC (PANTOPRAZOLE SODIUM) Take 1 tablet by mouth QOD  #90 x 3   Entered by:   Alfred Levins, CMA   Authorized by:   Birdie Sons MD   Signed by:   Alfred Levins, CMA on 03/24/2010   Method used:   Printed then faxed to ...       [facility]  [address] [phone][facility] ([facility]ail)       [address]       [place], [place]  [address]       Ph: [phone] or [phone]       Fax: [phone]   RxID:   [id number] COREG 12.5 MG TABS (CARVEDILOL) Take 1 tablet by mouth two times a day  #180 Tablet x 3   Entered by:   Alfred Levins, CMA   Authorized by:   Birdie Sons MD   Signed by:   Alfred Levins, CMA on 03/24/2010   Method used:   Printed then faxed to ...       [facility] [address] [phone][facility] (retail)       [address]       [place], [place]  [address]       Ph: [phone] or [phone]       Fax: [phone]   RxID:   [id number]

## 2010-04-10 NOTE — Progress Notes (Signed)
Summary: Pt req refill of Pantoprazole to [facility]  Phone Note Refill Request Call back at Work Phone [phone] Message from:  Patient on March 13, 2010 4:58 PM  Refills Requested: Medication #1:  PANTOPRAZOLE SODIUM 40 MG  TBEC Take 1 tablet by mouth QOD   Dosage confirmed as above?Dosage Confirmed   Supply Requested: 3 months Pt has changed pharmacys. Pls send to [facility] via [facility]  Phone # [phone]  mem id# [id number]    Method Requested: Telephone to Pharmacy Initial call taken by: Lucy Antigua,  March 13, 2010 4:58 PM  Follow-up for Phone Call        Rx faxed to pharmacy Follow-up by: Alfred Levins, CMA,  March 14, 2010 8:36 AM    Prescriptions: PANTOPRAZOLE SODIUM 40 MG  TBEC (PANTOPRAZOLE SODIUM) Take 1 tablet by mouth QOD  #90 x 3   Entered by:   Alfred Levins, CMA   Authorized by:   Birdie Sons MD   Signed by:   Alfred Levins, CMA on 03/14/2010   Method used:   Electronically to        [facility]       [address]       [place], [place]  [address]       Ph: [phone]       Fax: [phone]   RxID:   [id number]   Appended Document: Pt req refill of [facility]oprazole to [facility]    Prescriptions: PANTOPRAZOLE SODIUM 40 MG  TBEC (PANTOPRAZOLE SODIUM) Take 1 tablet by mouth QOD  #90 x 3   Entered by:   Alfred Levins, CMA   Authorized by:   Birdie Sons MD   Signed by:   Alfred Levins, CMA on 03/19/2010   Method used:   Printed then faxed to ...       [facility] [address] [phone]* (retail)       [address]       [place], [place]  [address]       Ph: [phone] or [phone]       Fax: [phone]   RxID:   [id number]    rx sent to wrong pharmacy faxed to [facility] at [phone]

## 2010-04-10 NOTE — Progress Notes (Signed)
Summary: didn't request an refill for tikosyn - why he receive a refill  Phone Note Call from Patient Call back at Work Phone [phone]   Caller: Patient Reason for Call: Talk to Nurse Summary of Call: pt receive rx for TIKOSYN 500 MCG. pt is wondering why he receive this med when he haven't called this office saying he was out of his med.  [facility] will not accept it back.. the last time this  med was order was in oct/pt called back and med was orderedr by dr Ladona Ridgel on 02-15-10 by fax Initial call taken by: Lorne Skeens,  February 26, 2010 2:53 PM  Follow-up for Phone Call        [phone] They said they had a fax from our office 02/15/10 Duwayne Heck-- she is getting pharmicist on line for me  we have not requested medication to go to the patient on 02/15/10.  Patient is in the doughnut hole and medication is going to cost him $274.00  What can we do about it?  I am calling and trying to help the patient out.  I have held on the line for over 15 min.   Dorthula Nettles is the pharmicist that I spoke with and this all goes back to 12/30/09 when the origanial Rx was sent and was held and now it was sent on 02/15/10 due to his med being at 75%  Discussed with Patient he is going to dispute charge and not pay.  Dennis Bast, RN, BSN  February 26, 2010 3:44 PM

## 2010-04-16 ENCOUNTER — Encounter (INDEPENDENT_AMBULATORY_CARE_PROVIDER_SITE_OTHER): Payer: Medicare Other | Admitting: Cardiology

## 2010-04-16 ENCOUNTER — Encounter: Payer: Self-pay | Admitting: Cardiology

## 2010-04-16 ENCOUNTER — Other Ambulatory Visit: Payer: Self-pay | Admitting: Cardiology

## 2010-04-16 ENCOUNTER — Encounter (INDEPENDENT_AMBULATORY_CARE_PROVIDER_SITE_OTHER): Payer: Medicare Other

## 2010-04-16 ENCOUNTER — Encounter: Payer: Self-pay | Admitting: Internal Medicine

## 2010-04-16 DIAGNOSIS — Z7901 Long term (current) use of anticoagulants: Secondary | ICD-10-CM

## 2010-04-16 DIAGNOSIS — E785 Hyperlipidemia, unspecified: Secondary | ICD-10-CM

## 2010-04-16 DIAGNOSIS — I4891 Unspecified atrial fibrillation: Secondary | ICD-10-CM

## 2010-04-16 DIAGNOSIS — I2510 Atherosclerotic heart disease of native coronary artery without angina pectoris: Secondary | ICD-10-CM

## 2010-04-16 DIAGNOSIS — I428 Other cardiomyopathies: Secondary | ICD-10-CM

## 2010-04-16 DIAGNOSIS — I495 Sick sinus syndrome: Secondary | ICD-10-CM

## 2010-04-16 LAB — CONVERTED CEMR LAB: POC INR: 1.3

## 2010-04-16 NOTE — Assessment & Plan Note (Addendum)
Summary: Recurrent AFib   Visit Type:  Follow-up Primary Provider:  Birdie Sons, MD  CC:  A fib, sob, weak, tired, and all began 9 days ago.Marland Kitchen  History of Present Illness: Primary Cardiologist:  Dr. Shawnie [name]  [name] is a 77 yo male with a h/o persistent AFib, NICM with EF 40%, nonobs CAD by cath 2001, and HLP who presents for possible recurrent AFib.  He has undergone multiple DCCVs in the past.  He was taken off Amiodarone due to elevated LFTs.  He was placed on Tiksoyn a few years ago.  He had DCCV done in 05/2008 and a TEE guided DCCV in 12/2009.  His last Echo in 12/2009 demonstrated EF 35-40%, mild to mod MR and PFO with R to L shunt.  He usually has worsening EF with recurrent AFib and has been brought in fairly quickly for DCCV when he returns to AFib.  He feels like he went into AFib 9 days ago.  He feels fatigued and more SOB.  He reports NYHA class 2 to 2b symptoms.  Of note, he continues to exercise, but notes decreased exercise tolerance.  No syncope.  No chest pain.  No orthopnea or PND.  No edema.    Current Medications (verified): 1)  Coreg 12.5 Mg Tabs (Carvedilol) .... Take 1 Tablet By Mouth Two Times A Day 2)  Coumadin 5 Mg Tabs (Warfarin Sodium) .... As Directed 3)  Tikosyn 500 Mcg Caps (Dofetilide) .... Take 1 Tablet By Mouth Two Times A Day 4)  Digoxin 0.25 Mg Tabs (Digoxin) .... Take 1 Tablet By Mouth Once A Day 5)  Multivitamins   Tabs (Multiple Vitamin) .... Once Daily 6)  Pantoprazole Sodium 40 Mg  Tbec (Pantoprazole Sodium) .... Take 1 Tablet By Mouth Qod 7)  Simvastatin 40 Mg Tabs (Simvastatin) .... Take One Tablet At Bedtime 8)  Finasteride 5 Mg Tabs (Finasteride) .... Take 1 Tablet Every Other Day  Allergies (verified): 1)  ! * Antihistamines  Past History:  Past Medical History: Last updated: 07/20/2008 SICK SINUS SYNDROME (ICD-427.81) CAD, UNSPECIFIED SITE (ICD-414.00) ATRIAL FIBRILLATION (ICD-427.31) COUMADIN THERAPY  (ICD-V58.61) CARDIOMYOPATHY, PRIMARY-NONISCHEMIC EF 45% (ICD-425.4) HYPERLIPIDEMIA (ICD-272.4) GERD (ICD-530.81) POLYMYALGIA RHEUMATICA (ICD-725) DYSPHAGIA (ICD-787.29) MIXED HEARING LOSS BILATERAL (ICD-389.22) PSA, INCREASED (ICD-790.93) BACK PAIN, LUMBAR (ICD-724.2) BENIGN PROSTATIC HYPERTROPHY (ICD-600.00) BREAST MASS (ICD-611.72)  Social History: Reviewed history from 02/07/2008 and no changes required. Married Alcohol use-yes Regular exercise-yes Patient is a former smoker.  Illicit Drug Use - no  Review of Systems       As per  the HPI.  All other systems reviewed and negative.   Vital Signs:  Patient profile:   77 year old male Height:      68.5 inches Weight:      169.75 pounds BMI:     25.53 Resp:     80 per minute BP sitting:   98 / 78  (right arm) Cuff size:   regular  Vitals Entered By: [name], CMA (April 07, 2010 12:03 PM)  Physical Exam  General:  Well nourished, well developed, in no acute distress HEENT: normal Neck: no JVD Cardiac:  normal S1, S2; irreg irreg rhythm Lungs:  clear to auscultation bilaterally, no wheezing, rhonchi or rales Abd: soft, nontender, no hepatomegaly Ext: no  edema Vascular: no carotid  bruits Skin: warm and dry Neuro:  CNs 2-12 intact, no focal abnormalities noted    EKG  Procedure date:  04/07/2010  Findings:      underlying atrial  fibrillation Heart rate 80 Intermittent ventricular pacing Left axis deviation Nonspecific ST-T wave changes  PPM Specifications Following MD:  Craig Bunting, MD     PPM Vendor:  [employer]     PPM Model Number:  [id number]     PPM Serial Number:  [id number] PPM DOI:  04/14/2005     PPM Implanting MD:  Craig Bunting, MD  Lead 1    Location: RA     DOI: 09/12/1996     Model #: 1342T     Serial #: [id number]     Status: active Lead 2    Location: [facility]     DOI: 09/12/1996     Model #: 1346T     Serial #: [id number]     Status: active   Indications:  SND   PPM Follow Up Pacer Dependent:   Yes      Episodes Coumadin:  Yes  Parameters Mode:  DDDR     Lower Rate Limit:  70     Upper Rate Limit:  115 Paced AV Delay:  250     Sensed AV Delay:  225  Impression & Recommendations:  Problem # 1:  ATRIAL FIBRILLATION (ICD-427.31)  Recurrent AFib on Tikosyn Rx.  His INRs are as follows: 11/2: 2.6 11/30: 2.2 12/28: 2.2 1/25: 2.4 1/30: 2.3 . . . INR still therapeutic today.  I apprised Dr. Antoine [name] of his hx. and the need for DCCV.  He agrees.  We will set him up for DCCV tomorrow.  Check bmet, Mg and cbc today. I discussed with [name] whether he has thought about ablation for his Afib.  He states he has not decided to pursue this.  He can discuss this further with Dr. Riley [name].  Orders: Cardioversion (Cardioversion) EKG w/ Interpretation (93000) TLB-BMP (Basic Metabolic Panel-BMET) (80048-METABOL) TLB-CBC Platelet - w/Differential (85025-CBCD) TLB-Magnesium (Mg) (83735-MG)

## 2010-04-16 NOTE — Letter (Signed)
Summary: Cardioversion Instructions  Architectural technologist, Main Office  [address]   [place], [place] [address]   Phone: [phone]  Fax: [phone]    Cardioversion  Instructions  04/07/2010 MRN: [id number]  [facility] [address] [place], [place]  [address]  Dear Mr. Moulder, You are scheduled for a Cardioversion on Tuesday April 08, 2010 with Dr. Shirlee Latch.     Please arrive at the [facility] at 12:00 noon on the day of your procedure.  1)   DIET:  A)   May have clear liquid breakfast, then nothing to eat or drink after 8:00 a.m.      Clear liquids include:  water, broth, Sprite, Ginger Ale, black coffee, tea (no sugar),      cranberry / grape / apple juice, jello (not red), popsicle from clear juices (not red).  2)   MAKE SURE YOU TAKE YOUR COUMADIN.  3)   A)   DO NOT TAKE these medications before your procedure:      Do not take Digoxin the morning of procedure.   B)   YOU MAY TAKE ALL of your remaining medications with a small amount of water.    4)  Must have a responsible person to drive you home.  5)   Bring a current list of your medications and current insurance cards.   * Special Note:  Every effort is made to have your procedure done on time. Occasionally there are emergencies that present themselves at the hospital that may cause delays. Please be patient if a delay does occur.  * If you have any questions after you get home, please call the office at [phone].

## 2010-04-16 NOTE — Progress Notes (Signed)
Summary: pt rtn call to carol  Phone Note Outgoing Call Call back at [facility] Phone [phone]   Caller: Patient Reason for Call: Talk to Nurse, Talk to Doctor, Lab or Test Results Summary of Call: pt rtn call to carol to get lab results Initial call taken by: Omer Jack,  April 08, 2010 4:07 PM Summary of Call: CMA s/w pt and advised of low mag level and to start mag ox 400 mg once daily as per [name], PA-C. [facility] [address] [facility] req. Danielle Rankin, CMA  April 08, 2010 5:12 PM

## 2010-04-16 NOTE — Progress Notes (Signed)
Summary: a-fib  Phone Note Call from Patient Call back at Work Phone [phone]   Caller: Patient Reason for Call: Talk to Nurse Summary of Call: pt states he having a-fib and sob.  Initial call taken by: Roe Coombs,  April 07, 2010 8:47 AM  Follow-up for Phone Call        I spoke with the pt and he has been in AFIB for 9 days.  The pt felt like he would convert on his own but this has not happened.  The pt is starting to have SOB with AFIB.  I scheduled the pt to come into the office today at 12:00 to see [name] PA-C.  Follow-up by: Julieta Gutting, RN, BSN,  April 07, 2010 11:07 AM

## 2010-04-16 NOTE — Medication Information (Signed)
Summary: rov/tm  Anticoagulant Therapy  Managed by: Bethena Midget, RN, BSN Referring MD: [name] MD, Maisie Fus PCP: Birdie Sons, MD Supervising MD: Antoine Poche MD, Fayrene Fearing Indication 1: Atrial Fibrillation Lab Used: [facility] [place] Site: [address] INR POC 2.3 INR RANGE 2-3  Dietary changes: no    Health status changes: no    Bleeding/hemorrhagic complications: no    Recent/future hospitalizations: no    Any changes in medication regimen? no    Recent/future dental: no  Any missed doses?: no       Is patient compliant with meds? yes      Comments: Pt seeing PA today due to still being in Afib  Allergies: 1)  ! * Antihistamines  Anticoagulation Management History:      The patient is taking warfarin and comes in today for a routine follow up visit.  Positive risk factors for bleeding include an age of 75 years or older.  The bleeding index is 'intermediate risk'.  Positive CHADS2 values include Age > 75 years old.  His last INR was 1.7 ratio.  Anticoagulation responsible provider: Antoine Poche MD, Fayrene Fearing.  INR POC: 2.3.  Cuvette Lot#: [id number].  Exp: 03/2011.    Anticoagulation Management Assessment/Plan:      The patient's current anticoagulation dose is Coumadin 5 mg tabs: As directed.  The target INR is 2 - 3.  The next INR is due 04/16/2010.  Anticoagulation instructions were given to patient.  Results were reviewed/authorized by Bethena Midget, RN, BSN.  He was notified by Bethena Midget, RN, BSN.         Prior Anticoagulation Instructions: INR 2.4 Continue 1 pill everyday except 1/2 pill on Tuesdays, Thursdays and Saturdays.  Recheck in 4 weeks.   Current Anticoagulation Instructions: INR 2.3 Continue 5mg s daily except 2.5mg s on Tuesdays, Thursdays and Saturdays. Recheck in 7-10 days post DCCV, call for that appt.

## 2010-04-17 LAB — BASIC METABOLIC PANEL
CO2: 26 mEq/L (ref 19–32)
Chloride: 103 mEq/L (ref 96–112)
Creatinine, Ser: 0.8 mg/dL (ref 0.4–1.5)
Sodium: 135 mEq/L (ref 135–145)

## 2010-04-17 LAB — MAGNESIUM: Magnesium: 1.8 mg/dL (ref 1.5–2.5)

## 2010-04-17 NOTE — Procedures (Signed)
  NAMEPHILL, STECK NO.:  [id number]  MEDICAL RECORD NO.:  [id number]          PATIENT TYPE:  OIB  LOCATION:  2899                         FACILITY:  [facility]  PHYSICIAN:  [name], MD      DATE OF BIRTH:  [birth year]  DATE OF PROCEDURE: DATE OF DISCHARGE:  04/08/2010                           CARDIAC CATHETERIZATION   PROCEDURE:  Direct current cardioversion.  INDICATIONS:  This is a 77 year old with history of atrial fibrillation, has been maintained in sinus rhythm on Tikosyn and has gone back in atrial fibrillation.  The patient does have pacemaker placed on the right side.  PROCEDURE NOTE:  The patient's INRs were observed to have been therapeutic for over a month, last INR was within the last 24 hours. The patient underwent informed consent, and then he was sedated by Anesthesiology using 100 mg IV propofol.  Direct current cardioversion was carried out at 150 joules with a biphasic waveform.  The patient converted to atrial pacing.  There were no complications.     [name], MD     DM/MEDQ  D:  04/08/2010  T:  04/09/2010  Job:  [id number]  cc:   [name]. [name], MD, [facility]  Electronically Signed by [name] MD on 04/17/2010 08:40:43 AM

## 2010-04-23 ENCOUNTER — Encounter: Payer: Self-pay | Admitting: Internal Medicine

## 2010-04-23 ENCOUNTER — Encounter (INDEPENDENT_AMBULATORY_CARE_PROVIDER_SITE_OTHER): Payer: Medicare Other

## 2010-04-23 DIAGNOSIS — Z7901 Long term (current) use of anticoagulants: Secondary | ICD-10-CM

## 2010-04-23 DIAGNOSIS — I4891 Unspecified atrial fibrillation: Secondary | ICD-10-CM

## 2010-04-24 NOTE — Cardiovascular Report (Signed)
Summary: TTM   TTM   Imported By: Roderic Ovens 04/17/2010 14:55:57  _____________________________________________________________________  External Attachment:    Type:   Image     Comment:   External Document

## 2010-04-24 NOTE — Medication Information (Signed)
Summary: [facility]  Anticoagulant Therapy  Managed by: Weston Brass, PharmD Referring MD: [name] MD, Maisie Fus PCP: Birdie Sons, MD Supervising MD: Graciela Husbands MD, Viviann Spare Indication 1: Atrial Fibrillation Lab Used: [facility] [place] Site: [address] INR POC 1.3 INR RANGE 2-3  Dietary changes: no    Health status changes: yes       Details: Had his cardioversion lasts week.  Bleeding/hemorrhagic complications: no    Recent/future hospitalizations: no    Any changes in medication regimen? yes       Details: Starting Magnesium Oxide 400mg  once daily  Recent/future dental: no  Any missed doses?: no       Is patient compliant with meds? yes       Allergies: 1)  ! * Antihistamines  Anticoagulation Management History:      The patient is taking warfarin and comes in today for a routine follow up visit.  Positive risk factors for bleeding include an age of 75 years or older.  The bleeding index is 'intermediate risk'.  Positive CHADS2 values include Age > 75 years old.  His last INR was 1.7 ratio.  Anticoagulation responsible provider: Graciela Husbands MD, Viviann Spare.  INR POC: 1.3.  Cuvette Lot#: [id number].  Exp: 03/2011.    Anticoagulation Management Assessment/Plan:      The patient's current anticoagulation dose is Coumadin 5 mg tabs: As directed.  The target INR is 2 - 3.  The next INR is due 04/23/2010.  Anticoagulation instructions were given to patient.  Results were reviewed/authorized by Weston Brass, PharmD.  He was notified by Margot Chimes PharmD Candidate.         Prior Anticoagulation Instructions: INR 2.3 Continue 5mg s daily except 2.5mg s on Tuesdays, Thursdays and Saturdays. Recheck in 7-10 days post DCCV, call for that appt.   Current Anticoagulation Instructions: INR 1.3   Take 2 tablets today then resume your normal schedule of 1 tablet everyday except 1/2 tablet on Tuesdays and Thursdays.  Recheck in 1 week.

## 2010-04-28 ENCOUNTER — Encounter: Payer: Self-pay | Admitting: Internal Medicine

## 2010-04-28 ENCOUNTER — Encounter (INDEPENDENT_AMBULATORY_CARE_PROVIDER_SITE_OTHER): Payer: Medicare Other

## 2010-04-28 DIAGNOSIS — I4891 Unspecified atrial fibrillation: Secondary | ICD-10-CM

## 2010-04-28 DIAGNOSIS — Z7901 Long term (current) use of anticoagulants: Secondary | ICD-10-CM

## 2010-04-28 LAB — CONVERTED CEMR LAB: POC INR: 2.2

## 2010-04-29 DIAGNOSIS — I4891 Unspecified atrial fibrillation: Secondary | ICD-10-CM

## 2010-04-30 ENCOUNTER — Telehealth: Payer: Self-pay | Admitting: Cardiology

## 2010-04-30 LAB — CONVERTED CEMR LAB: Digitoxin Lvl: 1.1 ng/mL (ref 0.8–2.0)

## 2010-04-30 NOTE — Medication Information (Signed)
Summary: [facility]  Anticoagulant Therapy  Managed by: Georgina Pillion, PharmD Referring MD: [name] MD, Maisie Fus PCP: Birdie Sons, MD Supervising MD: Graciela Husbands MD, Viviann Spare Indication 1: Atrial Fibrillation Lab Used: [facility] [place] Site: [address] INR POC 1.8 INR RANGE 2-3  Dietary changes: no    Health status changes: no    Bleeding/hemorrhagic complications: no    Recent/future hospitalizations: no    Any changes in medication regimen? no    Recent/future dental: no  Any missed doses?: no       Is patient compliant with meds? yes       Allergies: 1)  ! * Antihistamines  Anticoagulation Management History:      Positive risk factors for bleeding include an age of 75 years or older.  The bleeding index is 'intermediate risk'.  Positive CHADS2 values include Age > 44 years old.  His last INR was 1.7 ratio.  Anticoagulation responsible provider: Graciela Husbands MD, Viviann Spare.  INR POC: 1.8.  Cuvette Lot#: [id number].  Exp: 03/2011.    Anticoagulation Management Assessment/Plan:      The patient's current anticoagulation dose is Coumadin 5 mg tabs: As directed.  The target INR is 2 - 3.  The next INR is due 04/29/2010.  Anticoagulation instructions were given to patient.  Results were reviewed/authorized by Georgina Pillion, PharmD.  He was notified by Georgina Pillion PharmD.         Prior Anticoagulation Instructions: INR 1.3   Take 2 tablets today then resume your normal schedule of 1 tablet everyday except 1/2 tablet on Tuesdays and Thursdays.  Recheck in 1 week.    Current Anticoagulation Instructions: Take an extra 1/2 tablet  today to equal total daily dose of 1 1/2 tablets (7.5 mg) then continue with current regimen of 1 tablet (5 mg) daily except for 1/2 tablet (2.5 mg) on Thuesdays, Thursdays, and Saturdays.  INR 1.8

## 2010-05-05 ENCOUNTER — Telehealth: Payer: Self-pay | Admitting: Cardiology

## 2010-05-06 NOTE — Assessment & Plan Note (Signed)
Summary: eph post DCCV   Visit Type:  Post-hospital Primary Provider:  Birdie Sons, MD  CC:  No complaints.  History of Present Illness: Developed recurrent atrial fibrillation and was readmitted for cardioversion.  Has done well and feels back to normal.  Overall doing ok.    Problems Prior to Update: 1)  Back Pain  (ICD-724.5) 2)  Spinal Stenosis  (ICD-724.00) 3)  Cardiac Pacemaker in Situ  (ICD-V45.01) 4)  Sick Sinus Syndrome  (ICD-427.81) 5)  Cad, Unspecified Site  (ICD-414.00) 6)  Atrial Fibrillation  (ICD-427.31) 7)  Coumadin Therapy  (ICD-V58.61) 8)  Cardiomyopathy, Primary-nonischemic Ef 45%  (ICD-425.4) 9)  Hyperlipidemia  (ICD-272.4) 10)  Gerd  (ICD-530.81) 11)  Polymyalgia Rheumatica  (ICD-725) 12)  Mixed Hearing Loss Bilateral  (ICD-389.22) 13)  Psa, Increased  (ICD-790.93) 14)  Back Pain, Lumbar  (ICD-724.2) 15)  Benign Prostatic Hypertrophy  (ICD-600.00) 16)  Breast Mass  (ICD-611.72)  Current Medications (verified): 1)  Coreg 12.5 Mg Tabs (Carvedilol) .... Take 1 Tablet By Mouth Two Times A Day 2)  Coumadin 5 Mg Tabs (Warfarin Sodium) .... As Directed 3)  Tikosyn 500 Mcg Caps (Dofetilide) .... Take 1 Tablet By Mouth Two Times A Day 4)  Digoxin 0.25 Mg Tabs (Digoxin) .... Take 1 Tablet By Mouth Once A Day 5)  Multivitamins   Tabs (Multiple Vitamin) .... Once Daily 6)  Pantoprazole Sodium 40 Mg  Tbec (Pantoprazole Sodium) .... Take 1 Tablet By Mouth Qod 7)  Simvastatin 40 Mg Tabs (Simvastatin) .... Take One Tablet At Bedtime 8)  Finasteride 5 Mg Tabs (Finasteride) .... Take 1 Tablet Every Other Day 9)  Magnesium Oxide 400 Mg Tabs (Magnesium Oxide) .Marland Kitchen.. 1 Tab Once Daily  Allergies: 1)  ! * Antihistamines  Vital Signs:  Patient profile:   77 year old male Height:      68.5 inches Weight:      169.50 pounds BMI:     25.49 Pulse rate:   74 / minute Pulse rhythm:   irregular Resp:     18 per minute BP sitting:   110 / 70  (left arm) Cuff size:    regular  Vitals Entered By: Vikki Ports (April 16, 2010 3:21 PM)  Physical Exam  General:  Well developed, well nourished, in no acute distress. Head:  normocephalic and atraumatic Eyes:  PERRLA/EOM intact; conjunctiva and lids normal. Lungs:  Clear bilaterally to auscultation and percussion. Heart:  Pos S4.  Normal S1 and S2.  No murmurs Pulses:  pulses normal in all 4 extremities Extremities:  No clubbing or cyanosis. Neurologic:  Alert and oriented x 3.   EKG  Procedure date:  04/16/2010  Findings:      NSR.  Leftward axis.  Delay in R wave progression.  PPM Specifications Following MD:  [name], MD     PPM Vendor:  [employer]     PPM Model Number:  [phone] [id number]     PPM Serial Number:  [id number] PPM DOI:  04/14/2005     PPM Implanting MD:  [name], MD  Lead 1    Location: [facility]     DOI: 09/12/1996     Model #: [id number]     Serial #: [id number]     Status: active Lead 2    Location: [facility]     DOI: 09/12/1996     Model #: 1346T     Serial #: [id number]     Status: active   Indications:  SND  PPM Follow Up Pacer Dependent:  Yes      Episodes Coumadin:  Yes  Parameters Mode:  DDDR     Lower Rate Limit:  70     Upper Rate Limit:  115 Paced AV Delay:  250     Sensed AV Delay:  225  Impression & Recommendations:  Problem # 1:  ATRIAL FIBRILLATION (ICD-427.31) recurrent with successful cardioversion.  Remains on Tikosyn. His updated medication list for this problem includes:    Coreg 12.5 Mg Tabs (Carvedilol) .Marland Kitchen... Take 1 tablet by mouth two times a day    Coumadin 5 Mg Tabs (Warfarin sodium) .Marland Kitchen... As directed    Tikosyn 500 Mcg Caps (Dofetilide) .Marland Kitchen... Take 1 tablet by mouth two times a day    Digoxin 0.25 Mg Tabs (Digoxin) .Marland Kitchen... Take 1 tablet by mouth once a day  Orders: EKG w/ Interpretation (93000) T-Digoxin ([address]-60454) TLB-BMP (Basic Metabolic Panel-BMET) (80048-METABOL) TLB-Magnesium (Mg) (83735-MG)  His updated medication list for this problem includes:    Coreg  12.5 Mg Tabs (Carvedilol) .Marland Kitchen... Take 1 tablet by mouth two times a day    Coumadin 5 Mg Tabs (Warfarin sodium) .Marland Kitchen... As directed    Tikosyn 500 Mcg Caps (Dofetilide) .Marland Kitchen... Take 1 tablet by mouth two times a day    Digoxin 0.25 Mg Tabs (Digoxin) .Marland Kitchen... Take 1 tablet by mouth once a day  Problem # 2:  CARDIOMYOPATHY, PRIMARY-NONISCHEMIC EF 45% (ICD-425.4) Stable.  EF drops when in AF, so I encourage him to come in early if he has AF.  His updated medication list for this problem includes:    Coreg 12.5 Mg Tabs (Carvedilol) .Marland Kitchen... Take 1 tablet by mouth two times a day    Coumadin 5 Mg Tabs (Warfarin sodium) .Marland Kitchen... As directed    Tikosyn 500 Mcg Caps (Dofetilide) .Marland Kitchen... Take 1 tablet by mouth two times a day    Digoxin 0.25 Mg Tabs (Digoxin) .Marland Kitchen... Take 1 tablet by mouth once a day  Problem # 3:  HYPERLIPIDEMIA (ICD-272.4) currently on simva----will check dig level, and switch drugs.  His updated medication list for this problem includes:    Pravastatin Sodium 80 Mg Tabs (Pravastatin sodium) .Marland Kitchen... Take one tablet by mouth daily at bedtime  Orders: EKG w/ Interpretation (93000) T-Digoxin (09811-91478) TLB-BMP (Basic Metabolic Panel-BMET) (80048-METABOL) TLB-Magnesium (Mg) (83735-MG)  Patient Instructions: 1)  Your physician recommends that you have lab work today: BMP, Magnesium, Digoxin 2)  Your physician recommends that you return for a FASTING LIPID and LIVER Profile in 6 WEEKS (427.31, 425.4, 272.4)--nothing to eat or drink after midnight 3)  Your physician recommends that you schedule a follow-up appointment in: 3 MONTHS 4)  Your physician has recommended you make the following change in your medication: STOP Simvastatin, START Pravastatin 80mg  take one by mouth at bedtime Prescriptions: PRAVASTATIN SODIUM 80 MG TABS (PRAVASTATIN SODIUM) Take one tablet by mouth daily at bedtime  #90 x 3   Entered by:   Julieta Gutting, RN, BSN   Authorized by:   Ronaldo Miyamoto, MD, [facility]   Signed by:    Julieta Gutting, RN, BSN on 04/16/2010   Method used:   Electronically to        [facility] (mail-order)       [address]       [place], [place]  [address]       Ph: [phone]       Fax: [phone]   RxID:   [id number]

## 2010-05-06 NOTE — Medication Information (Signed)
Summary: [name]  Anticoagulant Therapy  Managed by: Bethena Midget, RN, BSN Referring MD: [name] MD, Maisie Fus PCP: Birdie Sons, MD Supervising MD: Tenny Craw MD, Gunnar Fusi Indication 1: Atrial Fibrillation Lab Used: [facility] [place] Site: [address] INR POC 2.2 INR RANGE 2-3  Dietary changes: no    Health status changes: no    Bleeding/hemorrhagic complications: no    Recent/future hospitalizations: no    Any changes in medication regimen? yes       Details: Swithced from Simvastin to Pravastatin few days ago   Recent/future dental: no  Any missed doses?: no       Is patient compliant with meds? yes       Allergies: 1)  ! * Antihistamines  Anticoagulation Management History:      The patient is taking warfarin and comes in today for a routine follow up visit.  Positive risk factors for bleeding include an age of 70 years or older.  The bleeding index is 'intermediate risk'.  Positive CHADS2 values include Age > 62 years old.  His last INR was 1.7 ratio.  Anticoagulation responsible provider: Tenny Craw MD, Gunnar Fusi.  INR POC: 2.2.  Cuvette Lot#: [id number].  Exp: 04/2011.    Anticoagulation Management Assessment/Plan:      The patient's current anticoagulation dose is Coumadin 5 mg tabs: As directed.  The target INR is 2 - 3.  The next INR is due 05/19/2010.  Anticoagulation instructions were given to patient.  Results were reviewed/authorized by Bethena Midget, RN, BSN.  He was notified by Bethena Midget, RN, BSN.         Prior Anticoagulation Instructions: Take an extra 1/2 tablet  today to equal total daily dose of 1 1/2 tablets (7.5 mg) then continue with current regimen of 1 tablet (5 mg) daily except for 1/2 tablet (2.5 mg) on Thuesdays, Thursdays, and Saturdays.  INR 1.8  Current Anticoagulation Instructions: INR 2.2 Continue 5mg s everyday except 2.5mg s on Tuesdays, Thursdays and Saturdays. Recheck in 3 weeks.

## 2010-05-06 NOTE — Progress Notes (Signed)
Summary: AFib  Phone Note Call from Patient Call back at Work Phone [phone]   Caller: Patient Reason for Call: Talk to Nurse Summary of Call: [place] re meds/atrial fib Initial call taken by: Roe Coombs,  April 30, 2010 10:30 AM  Follow-up for Phone Call        I spoke with the pt and he is having a "little bit" of AFib.  The pt forgot to take evening dose of Tikosyn and Coreg on Monday and then he took these meds about 6 hours late on Tuesday morning. The pt said he doubled up on these meds yesterday because he missed a dose.  The pt said at this time he feels okay, just a little tired.  The pt is scheduled to leave at 6AM in the morning going to [place].  The pt said his pulse does not feel fast but he can feel some skipped beats.  I made the pt aware that he should not double up on Tikosyn but he can take an extra Coreg if needed for increased pulse.  The pt said that he would go into the ER in [place] if he did not convert to NSR or symptoms worsened.  Follow-up by: Julieta Gutting, RN, BSN,  April 30, 2010 11:54 AM  Additional Follow-up for Phone Call Additional follow up Details #1::        Dr [name] aware of conversation with pt and agreed with plan.  Additional Follow-up by: Julieta Gutting, RN, BSN,  April 30, 2010 6:40 PM

## 2010-05-12 ENCOUNTER — Ambulatory Visit: Payer: Medicare Other | Admitting: Physician Assistant

## 2010-05-12 ENCOUNTER — Encounter: Payer: Self-pay | Admitting: Nurse Practitioner

## 2010-05-12 ENCOUNTER — Telehealth: Payer: Self-pay | Admitting: Cardiology

## 2010-05-12 ENCOUNTER — Ambulatory Visit (INDEPENDENT_AMBULATORY_CARE_PROVIDER_SITE_OTHER): Payer: Medicare Other | Admitting: Nurse Practitioner

## 2010-05-12 DIAGNOSIS — I428 Other cardiomyopathies: Secondary | ICD-10-CM

## 2010-05-12 DIAGNOSIS — I4891 Unspecified atrial fibrillation: Secondary | ICD-10-CM

## 2010-05-15 NOTE — Progress Notes (Signed)
Summary: a-fib again   Phone Note Call from Patient   Caller: Patient [phone] Reason for Call: Talk to Nurse Summary of Call: pt having a-fib again-pls call Initial call taken by: Glynda Jaeger,  May 05, 2010 10:06 AM  Follow-up for Phone Call        Left message for pt to call back. Julieta Gutting, RN, BSN  May 05, 2010 11:22 AM  I spoke with the pt and he is currentlyl in [place] but will be returning on Sunday. The pt is still in Afib but doing okay.  The pt c/o fatigue and has some SOB with exertion. The pt would like to schedule an appt for next Monday to be evaluated for possible DCCV.  Appt arranged on 05/12/10 at 10:00 with PA.  I made the pt aware that if his symptoms worsen he should seek care in ER.  Pt agreed with plan.  The pt denies rapid pulse with his AFib.  Follow-up by: Julieta Gutting, RN, BSN,  May 05, 2010 11:45 AM

## 2010-05-19 ENCOUNTER — Encounter: Payer: Self-pay | Admitting: Cardiology

## 2010-05-19 ENCOUNTER — Encounter (INDEPENDENT_AMBULATORY_CARE_PROVIDER_SITE_OTHER): Payer: Medicare Other

## 2010-05-19 DIAGNOSIS — Z7901 Long term (current) use of anticoagulants: Secondary | ICD-10-CM

## 2010-05-19 DIAGNOSIS — I4891 Unspecified atrial fibrillation: Secondary | ICD-10-CM

## 2010-05-19 LAB — CONVERTED CEMR LAB: POC INR: 2.2

## 2010-05-20 NOTE — Progress Notes (Signed)
Summary: pt not out of rhythm anymore  Phone Note Call from Patient Call back at Work Phone [phone]   Caller: Patient Reason for Call: Talk to Nurse, Talk to Doctor Summary of Call: pt thinks he converted out of a-fib and wants to know if he still needs appt this am with scott Initial call taken by: Omer Jack,  May 12, 2010 8:02 AM  Follow-up for Phone Call        [facility] Katina Dung, RN, BSN  May 12, 2010 8:09 AM -I talked with pt-pt states he thinks he is in NSR since yesterday morning--he states he feels better and his pulse seems regular--pt will keep  his appt today at 10  to document that he is in NSR

## 2010-05-20 NOTE — Assessment & Plan Note (Signed)
Summary: Cardiology Phone Note - A. Fib   Visit Type:  Follow-up Primary Provider:  Birdie Sons, MD  CC:  palpitations stopped 3/4 AM.  History of Present Illness: 77 y/o male w history of paf, now on tikosyn/coumadin, along with NICM.  On 2/22, pt noted irregularity to his heart rhythm, was not necessarily fast and didn't feel too bad.  He took an additional coreg and tikosyn and called into the office prior to leaving for [place].  While in [place], he noted irregularity the entire time but was not nearly as symptomatic as he used to be.  He said he took a few more naps than usual but overall was able to perform his usual activities and even worked out.  He denies chest pain, sob, pnd, orthopnea, dizziness, syncope, edema.  His weight has been stable.  He called late last week to set this appt. up today thinking he'd need repeat cardioversion but yesterday morning noted that his rhythm was regular.  Today, he is in fact A paced with underlying sinus rhythm.  He has no complaints.  EKG  Procedure date:  05/12/2010  Findings:      atrial paced, 70, underlying sinus rhythm.  poor r wave progression. LAD.  qtc - 438.   Current Medications (verified): 1)  Coreg 12.5 Mg Tabs (Carvedilol) .... Take 1 Tablet By Mouth Two Times A Day 2)  Coumadin 5 Mg Tabs (Warfarin Sodium) .... As Directed 3)  Tikosyn 500 Mcg Caps (Dofetilide) .... Take 1 Tablet By Mouth Two Times A Day 4)  Digoxin 0.25 Mg Tabs (Digoxin) .... Take 1 Tablet By Mouth Once A Day 5)  Multivitamins   Tabs (Multiple Vitamin) .... Once Daily 6)  Pantoprazole Sodium 40 Mg  Tbec (Pantoprazole Sodium) .... Take 1 Tablet By Mouth Qod 7)  Pravastatin Sodium 80 Mg Tabs (Pravastatin Sodium) .... Take One Tablet By Mouth Daily At Bedtime 8)  Finasteride 5 Mg Tabs (Finasteride) .... Take 1 Tablet Every Other Day 9)  Magnesium Oxide 400 Mg Tabs (Magnesium Oxide) .Marland Kitchen.. 1 Tab Once Daily  Allergies (verified): 1)  ! * Antihistamines  Past  History:  Past Medical History: Last updated: 07/20/2008 SICK SINUS SYNDROME (ICD-427.81) CAD, UNSPECIFIED SITE (ICD-414.00) ATRIAL FIBRILLATION (ICD-427.31) COUMADIN THERAPY (ICD-V58.61) CARDIOMYOPATHY, PRIMARY-NONISCHEMIC EF 45% (ICD-425.4) HYPERLIPIDEMIA (ICD-272.4) GERD (ICD-530.81) POLYMYALGIA RHEUMATICA (ICD-725) DYSPHAGIA (ICD-787.29) MIXED HEARING LOSS BILATERAL (ICD-389.22) PSA, INCREASED (ICD-790.93) BACK PAIN, LUMBAR (ICD-724.2) BENIGN PROSTATIC HYPERTROPHY (ICD-600.00) BREAST MASS (ICD-611.72)  Review of Systems       As per HPI, felt tired with some reduction in energy but overall has been doing well.  All other systems reviewed and negative.  Vital Signs:  Patient profile:   77 year old male Height:      68.5 inches Weight:      169 pounds BMI:     25.41 Pulse rate:   70 / minute BP sitting:   132 / 64  (left arm) Cuff size:   regular  Vitals Entered By: Hardin Negus, RMA (May 12, 2010 10:24 AM)  Physical Exam  General:  Well developed, well nourished, in no acute distress. Head:  HEENT: Normal Neck:  supple without bruits or JVD Lungs:  respirations regular and unlabored, clear to auscultation Heart:  regular S1, S2, no S3, S4, or murmurs Abdomen:  soft, nontender, nondistended, bowel sounds present x4 Pulses:  radial, brachial, posterior tibial pulses are 2+ and equal bilaterally. Extremities:  no clubbing, cyanosis, or edema Neurologic:  awake alert  and oriented x3 Skin:  warm and dry Psych:  normal affect   New Orders:     1)  EKG w/ Interpretation (93000)   PPM Specifications Following MD:  [name], MD     PPM Vendor:  [employer]     PPM Model Number:  [id number]     PPM Serial Number:  [id number] PPM DOI:  04/14/2005     PPM Implanting MD:  [name], MD  Lead 1    Location: [facility]     DOI: 09/12/1996     Model #: 1342T     Serial #: [id number]     Status: active Lead 2    Location: [facility]     DOI: 09/12/1996     Model #: 1346T     Serial #: [id number]      Status: active   Indications:  SND   PPM Follow Up Pacer Dependent:  Yes      Episodes Coumadin:  Yes  Parameters Mode:  DDDR     Lower Rate Limit:  70     Upper Rate Limit:  115 Paced AV Delay:  250     Sensed AV Delay:  225  Impression & Recommendations:  Problem # 1:  ATRIAL FIBRILLATION (ICD-427.31) patient had recurrence of atrial fibrillation which persisted for approximately 12 days.  His symptoms were not nearly as severe as he has experienced in the past.  He is back in sinus rhythm today and he believes he converted sometime yesterday morning.  He remains on tikosyn, carvedilol, digoxin, and Coumadin therapy.  we discussed that if he has recurrent palpitations/atrial fibrillation that he may take an additional carvedilol but should not take additional tikosyn.  as he has a history of chf in the setting of a. fib, he knows to present for evaluation if at anytime his symptoms worsen or afib is prolonged.  we also discussed titrating his coreg further, as his bp is in the 130's in the office today, but he tells me that his pressure is usually110 or below @ home, so it's not clear that he would tolerate additional titration at this time.  He'll follow up with dr. [name] in 4-6 weeks or sooner if necessary.  Problem # 2:  CARDIOMYOPATHY, PRIMARY-NONISCHEMIC EF 45% (ICD-425.4) Has been doing well, despite #1.  He is euvolemic today.  Cont. coreg, digoxin.  consider acei in the future if bp tolerates.  Other Orders: EKG w/ Interpretation (93000)  Patient Instructions: 1)  Your physician recommends that you schedule a follow-up appointment in: 4-6 weeks with DR. [name]...Marland KitchenMarland KitchenAS PER DEVICE CLINIC YOU NEED TO MAKE AN APPOINTMENT TO SEE DR. Ladona Ridgel AS WELL. 2)  Your physician recommends that you continue on your current medications as directed. Please refer to the Current Medication list given to you today.

## 2010-05-21 LAB — PROTIME-INR: INR: 2.62 — ABNORMAL HIGH (ref 0.00–1.49)

## 2010-05-27 ENCOUNTER — Other Ambulatory Visit: Payer: Medicare Other

## 2010-05-27 NOTE — Medication Information (Signed)
Summary: rov/tm  Anticoagulant Therapy  Managed by: Samantha Crimes, PharmD Referring MD: [name] MD, Maisie Fus PCP: Birdie Sons, MD Supervising MD: Jens Som MD, Arlys John Indication 1: Atrial Fibrillation Lab Used: [facility] [place] Site: [address] INR POC 2.2 INR RANGE 2-3  Dietary changes: no    Health status changes: no    Bleeding/hemorrhagic complications: no    Recent/future hospitalizations: no    Any changes in medication regimen? no    Recent/future dental: no  Any missed doses?: no       Is patient compliant with meds? yes       Current Medications (verified): 1)  Coreg 12.5 Mg Tabs (Carvedilol) .... Take 1 Tablet By Mouth Two Times A Day 2)  Coumadin 5 Mg Tabs (Warfarin Sodium) .... As Directed 3)  Tikosyn 500 Mcg Caps (Dofetilide) .... Take 1 Tablet By Mouth Two Times A Day 4)  Digoxin 0.25 Mg Tabs (Digoxin) .... Take 1 Tablet By Mouth Once A Day 5)  Multivitamins   Tabs (Multiple Vitamin) .... Once Daily 6)  Pantoprazole Sodium 40 Mg  Tbec (Pantoprazole Sodium) .... Take 1 Tablet By Mouth Qod 7)  Pravastatin Sodium 80 Mg Tabs (Pravastatin Sodium) .... Take One Tablet By Mouth Daily At Bedtime 8)  Finasteride 5 Mg Tabs (Finasteride) .... Take 1 Tablet Every Other Day 9)  Magnesium Oxide 400 Mg Tabs (Magnesium Oxide) .Marland Kitchen.. 1 Tab Once Daily  Allergies (verified): 1)  ! * Antihistamines  Anticoagulation Management History:      Positive risk factors for bleeding include an age of 69 years or older.  The bleeding index is 'intermediate risk'.  Positive CHADS2 values include Age > 92 years old.  His last INR was 1.7 ratio.  Anticoagulation responsible provider: Jens Som MD, Arlys John.  INR POC: 2.2.  Exp: 04/2011.    Anticoagulation Management Assessment/Plan:      The patient's current anticoagulation dose is Coumadin 5 mg tabs: As directed.  The target INR is 2 - 3.  The next INR is due 06/16/2010.  Anticoagulation instructions were given to patient.   Results were reviewed/authorized by Samantha Crimes, PharmD.         Prior Anticoagulation Instructions: INR 2.2 Continue 5mg s everyday except 2.5mg s on Tuesdays, Thursdays and Saturdays. Recheck in 3 weeks.   Current Anticoagulation Instructions: Return to clinic in 4 weeks Cont with current drug regimen

## 2010-05-28 ENCOUNTER — Ambulatory Visit: Payer: Medicare Other

## 2010-05-28 ENCOUNTER — Other Ambulatory Visit: Payer: Medicare Other

## 2010-05-28 DIAGNOSIS — I428 Other cardiomyopathies: Secondary | ICD-10-CM

## 2010-05-28 DIAGNOSIS — E785 Hyperlipidemia, unspecified: Secondary | ICD-10-CM

## 2010-05-28 DIAGNOSIS — I4891 Unspecified atrial fibrillation: Secondary | ICD-10-CM

## 2010-05-28 LAB — HEPATIC FUNCTION PANEL
ALT: 24 U/L (ref 0–53)
AST: 24 U/L (ref 0–37)
Albumin: 3.9 g/dL (ref 3.5–5.2)
Total Protein: 6.5 g/dL (ref 6.0–8.3)

## 2010-05-28 LAB — LIPID PANEL
HDL: 31.9 mg/dL — ABNORMAL LOW (ref >39.00)
Triglycerides: 126 mg/dL (ref 0.0–149.0)

## 2010-06-03 ENCOUNTER — Telehealth: Payer: Self-pay | Admitting: Internal Medicine

## 2010-06-03 MED ORDER — DIGOXIN 250 MCG PO TABS: 250.0000 ug | ORAL_TABLET | Freq: Every day | ORAL | Status: DC

## 2010-06-03 MED ORDER — DOFETILIDE 500 MCG PO CAPS: 500.0000 ug | ORAL_CAPSULE | Freq: Two times a day (BID) | ORAL | Status: DC

## 2010-06-03 MED ORDER — WARFARIN SODIUM 5 MG PO TABS: 5.0000 mg | ORAL_TABLET | ORAL | Status: DC

## 2010-06-03 NOTE — Telephone Encounter (Signed)
Sent in electronically, pt will need appt, pt aware

## 2010-06-03 NOTE — Telephone Encounter (Signed)
Refill Digoxin, tikosyn .5mg , warfarin 5mg  to [facility] order.

## 2010-06-06 ENCOUNTER — Telehealth: Payer: Self-pay | Admitting: *Deleted

## 2010-06-06 NOTE — Telephone Encounter (Signed)
[facility].  Info on voice mail.  Very long and complicated???

## 2010-06-11 NOTE — Telephone Encounter (Signed)
Left message on machine for patient  To return our call 

## 2010-06-12 ENCOUNTER — Telehealth: Payer: Self-pay | Admitting: Cardiology

## 2010-06-12 ENCOUNTER — Other Ambulatory Visit: Payer: Self-pay | Admitting: *Deleted

## 2010-06-12 DIAGNOSIS — I10 Essential (primary) hypertension: Secondary | ICD-10-CM

## 2010-06-12 MED ORDER — CARVEDILOL 12.5 MG PO TABS: 12.5000 mg | ORAL_TABLET | Freq: Two times a day (BID) | ORAL | Status: DC

## 2010-06-12 NOTE — Telephone Encounter (Signed)
Pt rtn call 

## 2010-06-12 NOTE — Telephone Encounter (Signed)
I spoke with the pt and made him aware of 05/28/10 lab work.

## 2010-06-16 ENCOUNTER — Encounter: Payer: Self-pay | Admitting: Cardiology

## 2010-06-16 ENCOUNTER — Encounter: Payer: Medicare Other | Admitting: *Deleted

## 2010-06-16 ENCOUNTER — Ambulatory Visit (INDEPENDENT_AMBULATORY_CARE_PROVIDER_SITE_OTHER): Payer: Medicare Other | Admitting: *Deleted

## 2010-06-16 DIAGNOSIS — Z7901 Long term (current) use of anticoagulants: Secondary | ICD-10-CM

## 2010-06-16 DIAGNOSIS — I4891 Unspecified atrial fibrillation: Secondary | ICD-10-CM

## 2010-06-16 NOTE — Patient Instructions (Signed)
INR 3.1 Patient to eat an extra serving of dark green leafy vegetables.  Continue taking 1 tablet (5mg ) daily, except take 1/2 tablet on Tuesdays, Thursdays, and Saturdays. Recheck in 4 weeks.

## 2010-06-17 ENCOUNTER — Telehealth: Payer: Self-pay | Admitting: Cardiology

## 2010-06-17 ENCOUNTER — Encounter: Payer: Medicare Other | Admitting: *Deleted

## 2010-06-17 NOTE — Telephone Encounter (Signed)
I spoke with the pharmacist and they cannot dispense this pt's medication because Dr [name] is not authorized on this medication.  I made the pharmacist aware that this medication should be prescribed by Dr [name].  I also verified the pt's Tikosyn Rx  one by mouth two times a day.    I spoke with the pt and made him aware that medication has been corrected with [facility].

## 2010-06-18 ENCOUNTER — Ambulatory Visit (INDEPENDENT_AMBULATORY_CARE_PROVIDER_SITE_OTHER): Payer: Medicare Other | Admitting: Cardiology

## 2010-06-18 ENCOUNTER — Encounter: Payer: Self-pay | Admitting: Cardiology

## 2010-06-18 VITALS — BP 120/72 | HR 69 | Ht 68.0 in | Wt 168.0 lb

## 2010-06-18 DIAGNOSIS — E785 Hyperlipidemia, unspecified: Secondary | ICD-10-CM

## 2010-06-18 DIAGNOSIS — I4891 Unspecified atrial fibrillation: Secondary | ICD-10-CM

## 2010-06-18 DIAGNOSIS — I2510 Atherosclerotic heart disease of native coronary artery without angina pectoris: Secondary | ICD-10-CM

## 2010-06-18 NOTE — Patient Instructions (Signed)
Your physician wants you to follow-up in: 3 MONTHS.  You will receive a reminder letter in the mail two months in advance. If you don't receive a letter, please call our office to schedule the follow-up appointment.  Your physician recommends that you continue on your current medications as directed. Please refer to the Current Medication list given to you today.  

## 2010-06-19 LAB — PROTIME-INR
INR: 2.5 — ABNORMAL HIGH (ref 0.00–1.49)
Prothrombin Time: 28.8 seconds — ABNORMAL HIGH (ref 11.6–15.2)

## 2010-06-19 LAB — BASIC METABOLIC PANEL
CO2: 26 mEq/L (ref 19–32)
Calcium: 8.9 mg/dL (ref 8.4–10.5)
Creatinine, Ser: 0.97 mg/dL (ref 0.4–1.5)
Glucose, Bld: 99 mg/dL (ref 70–99)

## 2010-06-19 LAB — CBC
Hemoglobin: 14.6 g/dL (ref 13.0–17.0)
MCHC: 34.6 g/dL (ref 30.0–36.0)
RDW: 13 % (ref 11.5–15.5)

## 2010-06-22 NOTE — Assessment & Plan Note (Addendum)
No symptoms.  Mild scattered disease at last cath. Continue on prevention program with lipid lowering.  Not having chest pain at the present time.

## 2010-06-22 NOTE — Progress Notes (Signed)
HPI:  [name] is in for follow up.  He was in [place], and seemingly out of rhythm while there, but returned to normal prior to getting back.  Feeling really quite well.  We discussed the importance of maintaining NSR to his overall LVEF, and how it falls when out.  We also reviewed the mechanisms of ablation should this become necessary.  Given his age, we have tried to defer this.  He also denies any chest pain.  We reviewed his lipid information as well.  Denies any chest pain, or shortness of breath.  Activity level is good.   Current Outpatient Prescriptions  Medication Sig Dispense Refill  . carvedilol (COREG) 12.5 MG tablet Take 1 tablet (12.5 mg total) by mouth 2 (two) times daily.  60 tablet  0  . digoxin (LANOXIN) 0.25 MG tablet Take 1 tablet (250 mcg total) by mouth daily.  90 tablet  0  . dofetilide (TIKOSYN) 500 MCG capsule Take 500 mcg by mouth 2 (two) times daily.        . finasteride (PROSCAR) 5 MG tablet Take 5 mg by mouth every other day.        . Magnesium Oxide 400 (241.3 MG) MG TABS Take 1 tablet by mouth daily.        . Multiple Vitamins-Minerals (MULTIVITAMIN WITH MINERALS) tablet Take 1 tablet by mouth daily.        . pantoprazole (PROTONIX) 40 MG tablet Take 40 mg by mouth every other day.        . pravastatin (PRAVACHOL) 80 MG tablet Take 80 mg by mouth daily.        Marland Kitchen warfarin (COUMADIN) 5 MG tablet Take 1 tablet (5 mg total) by mouth as directed.  90 tablet  0    Allergies  Allergen Reactions  . Antihistamines, Diphenhydramine-Type     Past Medical History  Diagnosis Date  . Coronary artery disease   . Cardiomyopathy primary-nonischemic EF 45%  . Hyperlipidemia   . GERD (gastroesophageal reflux disease)   . Polymyalgia rheumatica   . Sick sinus syndrome   . Atrial fibrillation   . Dysphagia   . Hearing loss, mixed, bilateral   . Increased prostate specific antigen (PSA) velocity   . Lumbar back pain   . BPH (benign prostatic hypertrophy)   . Breast mass       Past Surgical History  Procedure Date  . Cataract extraction     left  . Umbilical hernia repair   . Insert / replace / remove pacemaker     Family History  Problem Relation Age of Onset  . Heart attack Father 40    deceased  . Aneurysm Mother     deceased AAA  . Hypertension Mother   . Other Brother     deceased at birth    History   Social History  . Marital Status: Married    Spouse Name: N/A    Number of Children: N/A  . Years of Education: N/A   Occupational History  . Not on file.   Social History Main Topics  . Smoking status: Former Games developer  . Smokeless tobacco: Not on file  . Alcohol Use: Yes  . Drug Use: No  . Sexually Active: Not on file   Other Topics Concern  . Not on file   Social History Narrative  . No narrative on file    ROS: Please see the HPI.  All other systems reviewed and negative.  PHYSICAL EXAM:  BP 120/72  Pulse 69  Ht 5\' 8"  (1.727 m)  Wt 168 lb (76.204 kg)  BMI 25.54 kg/m2  General: Well developed, well nourished, in no acute distress. Head:  Normocephalic and atraumatic. Neck: no JVD Lungs: Clear to auscultation and percussion. Heart: Normal S1 and S2.  No murmur, rubs or gallops.  Pulses: Pulses normal in all 4 extremities. Extremities: No clubbing or cyanosis. No edema. Neurologic: Alert and oriented x 3.  ZOX:[id number] pacing and ventricular native beats.  Left axis deviation.  Delay in R wave progression likely secondary to left axis.    ASSESSMENT AND PLAN:

## 2010-06-22 NOTE — Assessment & Plan Note (Signed)
Just over goal on generic pravachol.  Would not suggest changes in medication.

## 2010-06-22 NOTE — Assessment & Plan Note (Signed)
Probably another episode.   He is astute to its occurrence.  Continue medical therapy.  If he continue to break through, then we would suggest consideration of ablation despite his age as his LVEF drops when he stays in afib, often even with good rate control.

## 2010-06-30 ENCOUNTER — Encounter: Payer: Self-pay | Admitting: Internal Medicine

## 2010-06-30 ENCOUNTER — Other Ambulatory Visit: Payer: Self-pay | Admitting: Internal Medicine

## 2010-06-30 ENCOUNTER — Ambulatory Visit (INDEPENDENT_AMBULATORY_CARE_PROVIDER_SITE_OTHER): Payer: Medicare Other | Admitting: Internal Medicine

## 2010-06-30 DIAGNOSIS — Z950 Presence of cardiac pacemaker: Secondary | ICD-10-CM

## 2010-06-30 DIAGNOSIS — I495 Sick sinus syndrome: Secondary | ICD-10-CM

## 2010-06-30 DIAGNOSIS — I4891 Unspecified atrial fibrillation: Secondary | ICD-10-CM

## 2010-06-30 DIAGNOSIS — E785 Hyperlipidemia, unspecified: Secondary | ICD-10-CM

## 2010-06-30 NOTE — Progress Notes (Signed)
HPI [name] returns today for followup. He is a very pleasant 77 year old man with a history of symptomatic bradycardia, paroxysmal atrial fibrillation, and coronary disease. He is mild to moderate left ventricular dysfunction. His heart failure is class II. He has recently traveled to [place] and thinks that he was out of rhythm while he was there. He has not however had any syncope and denies much in the way of palpitations. No peripheral edema. Allergies  Allergen Reactions  . Antihistamines, Diphenhydramine-Type      Current Outpatient Prescriptions  Medication Sig Dispense Refill  . carvedilol (COREG) 12.5 MG tablet Take 1 tablet (12.5 mg total) by mouth 2 (two) times daily.  60 tablet  0  . digoxin (LANOXIN) 0.25 MG tablet Take 1 tablet (250 mcg total) by mouth daily.  90 tablet  0  . dofetilide (TIKOSYN) 500 MCG capsule Take 500 mcg by mouth 2 (two) times daily.        . finasteride (PROSCAR) 5 MG tablet Take 5 mg by mouth every other day.        . Magnesium Oxide 400 (241.3 MG) MG TABS Take 1 tablet by mouth daily.        . Multiple Vitamins-Minerals (MULTIVITAMIN WITH MINERALS) tablet Take 1 tablet by mouth daily.        . pantoprazole (PROTONIX) 40 MG tablet Take 40 mg by mouth every other day.        . pravastatin (PRAVACHOL) 80 MG tablet Take 80 mg by mouth daily.        Marland Kitchen warfarin (COUMADIN) 5 MG tablet Take 1 tablet (5 mg total) by mouth as directed.  90 tablet  0     Past Medical History  Diagnosis Date  . Coronary artery disease   . Cardiomyopathy primary-nonischemic EF 45%  . Hyperlipidemia   . GERD (gastroesophageal reflux disease)   . Polymyalgia rheumatica   . Sick sinus syndrome   . Atrial fibrillation   . Dysphagia   . Hearing loss, mixed, bilateral   . Increased prostate specific antigen (PSA) velocity   . Lumbar back pain   . BPH (benign prostatic hypertrophy)   . Breast mass     ROS:   All systems reviewed and negative except as noted in the  HPI.   Past Surgical History  Procedure Date  . Cataract extraction     left  . Umbilical hernia repair   . Insert / replace / remove pacemaker      Family History  Problem Relation Age of Onset  . Heart attack Father 52    deceased  . Aneurysm Mother     deceased AAA  . Hypertension Mother   . Other Brother     deceased at birth     History   Social History  . Marital Status: Married    Spouse Name: N/A    Number of Children: N/A  . Years of Education: N/A   Occupational History  . Not on file.   Social History Main Topics  . Smoking status: Former Games developer  . Smokeless tobacco: Not on file  . Alcohol Use: Yes  . Drug Use: No  . Sexually Active: Not on file   Other Topics Concern  . Not on file   Social History Narrative  . No narrative on file     There were no vitals taken for this visit.  Physical Exam:  Well appearing NAD HEENT: Unremarkable Neck:  No JVD, no thyromegally Lymphatics:  No adenopathy Back:  No CVA tenderness Lungs:  Clear. Well-healed pacemaker incision. HEART:  Regular rate rhythm, no murmurs, no rubs, no clicks Abd:  Flat, positive bowel sounds, no organomegally, no rebound, no guarding Ext:  2 plus pulses, no edema, no cyanosis, no clubbing Skin:  No rashes no nodules Neuro:  CN II through XII intact, motor grossly intact  DEVICE  Normal device function.  See PaceArt for details.   Assess/Plan:

## 2010-06-30 NOTE — Assessment & Plan Note (Signed)
His atrial fibrillation appears to be well-controlled. He is out of rhythm between 10 and 20% of the time. He will continue his current antiarrhythmic therapy with tikosyn.

## 2010-06-30 NOTE — Patient Instructions (Signed)
Your physician wants you to follow-up in: 6 months with device clinic and 12 months with Dr Taylor You will receive a reminder letter in the mail two months in advance. If you don't receive a letter, please call our office to schedule the follow-up appointment.  

## 2010-06-30 NOTE — Assessment & Plan Note (Signed)
He is instructed to maintain a low-fat diet. He will continue statin therapy.

## 2010-06-30 NOTE — Assessment & Plan Note (Signed)
His device is working normally. He has over 8 years of longevity remaining. We'll recheck in several months.

## 2010-07-14 ENCOUNTER — Encounter: Payer: Medicare Other | Admitting: *Deleted

## 2010-07-15 ENCOUNTER — Ambulatory Visit: Payer: Medicare Other | Admitting: Cardiology

## 2010-07-16 ENCOUNTER — Ambulatory Visit (INDEPENDENT_AMBULATORY_CARE_PROVIDER_SITE_OTHER): Payer: Medicare Other | Admitting: *Deleted

## 2010-07-16 DIAGNOSIS — I4891 Unspecified atrial fibrillation: Secondary | ICD-10-CM

## 2010-07-22 NOTE — Assessment & Plan Note (Signed)
[facility]                            CARDIOLOGY OFFICE NOTE   NAME:Gillingham, CLOY COZZENS                   MRN:          [id number]  DATE:09/15/2007                            DOB:          [birth year]    [name] is in for followup.  In general, he has been relatively stable.  He has really been doing really quite well.  He feels good.   Current medications include multivitamin, Coumadin as directed, digoxin  0.25 mg daily, Tikosyn 500 mg b.i.d., Crestor 10 mg daily, Coreg 12.5  b.i.d., and Voltaren p.r.n.   Blood pressure is 108/70, pulse is 78.  The lung fields are actually  quite clear.  Cardiac rhythm is regular.   EKG reveals what appears to be atrial ventricular pacing.   Overall, [name] continues to do well.  His lipids are controlled.  He  has nonischemic cardiomyopathy with some underlying coronary disease.  We will continue to treat him medically at the present time and I will  continue to see him in followup on an every 47-month basis.     [name]. [name], MD, [facility]  Electronically Signed    TDS/MedQ  DD: 09/23/2007  DT: 09/24/2007  Job #: [id number]

## 2010-07-22 NOTE — Assessment & Plan Note (Signed)
[facility]                            CARDIOLOGY OFFICE NOTE   [name], [name]                   MRN:          [id number]  DATE:05/17/2008                            DOB:          December 17, 1929    [name] is in for followup.  He recently presented with recurrent  atrial fib.  He was cardioverted by Dr. Gala Romney.  He has been doing  well since that time.  He feels back to normal.  He has a few more  skips.  He remains on Tikosyn 0.5 mg p.o. b.i.d. and he tolerates this  nicely.  His biggest complaint has been some increasing hip discomfort.   CURRENT MEDICATIONS:  1. Multivitamin daily.  2. Coumadin as directed.  3. Digoxin 0.25 mg daily.  4. Crestor 10 mg daily.  5. Coreg 12.5 b.i.d.  6. Voltaren 125 daily.  7. Tikosyn 0.5 b.i.d.  8. Pantoprazole 40 mg daily.  9. He is currently not on any aspirin because of the warfarin      anticoagulation.   PHYSICAL EXAMINATION:  GENERAL:  He is alert and oriented, in no  distress.  VITAL SIGNS:  Blood pressure 120/70, pulse 70.  LUNGS:  Clear.  CARDIAC:  Rhythm is regular with an S4 gallop.   Recent laboratory studies revealed a BUN of 25 and creatinine of 0.97.  INR was 2.5 at the time of his cardioversion.  In November, his liver  function studies were unremarkable.  His LDL at that time was 74.   IMPRESSION:  1. Nonischemic cardiomyopathy.  2. Mild coronary artery disease.  3. Recurrent atrial fibrillation on Tikosyn.  4. Sick sinus syndrome, status post implantation of permanent      pacemaker.  5. Hypercholesterolemia on lipid lowering therapy.  6. Hip discomfort bilaterally.   PLAN:  1. Return to clinic in 2 months.  2. The patient will see Dr. Cato Mulligan in follow up.  3. He will call us if there are any changes in the interim.   ADDENDUM:  Electrocardiogram was done today.  This reveals sinus rhythm  as a function of atrial pacing.  QT interval is 414 milliseconds.     [name]. [name], MD, [facility]  Electronically Signed    TDS/MedQ  DD: 05/17/2008  DT: 05/17/2008  Job #: [id number]

## 2010-07-22 NOTE — Assessment & Plan Note (Signed)
[facility]                            CARDIOLOGY OFFICE NOTE   NAME:Angus, XZAVIEN HARADA                   MRN:          [id number]  DATE:01/26/2008                            DOB:          [birth year]    [name] is in for a followup visit.  In general, he is getting along  reasonably well.  He denies any ongoing chest pain.  He he feels like he  has not been out of rhythm, and he seems to tolerate the Tikosyn without  much difficulty.   MEDICATIONS:  1. Multivitamin.  2. Coumadin as directed.  3. Digoxin 0.25 mg daily.  4. Tikosyn 500 mg b.i.d.  5. Crestor 10 mg daily.  6. Coreg 12.5 b.i.d.  7. Voltaren 125 mg daily.   PHYSICAL EXAMINATION:  The blood pressure is 110/70, the pulse is 76.  The lung fields are clear and the cardiac rhythm is regular with an S4  gallop.  The extremities revealed no edema.   The electrocardiogram demonstrates atrial pacing with ventricular  tracking.  The QT interval is upper normal at 456 milliseconds.  There  is a rare premature ventricular contraction.   IMPRESSION:  1. Known history of nonischemic cardiomyopathy.  2. Mild coronary artery disease.  3. Recurrent paroxysmal atrial fibrillation with symptoms, improved      with Tikosyn therapy.  4. Hypercholesterolemia with recent lipid profile, January 16, 2008,      HDL of 26, LDL of 74.  Total cholesterol of 116, triglycerides of      81 with normal liver function studies and normal potassium.   PLAN:  1. Return to clinic in 4 months.  2. Continue medical therapy.     [name]. [name], MD, [facility]  Electronically Signed    TDS/MedQ  DD: 01/26/2008  DT: 01/27/2008  Job #: [id number]

## 2010-07-22 NOTE — Assessment & Plan Note (Signed)
[facility]                            CARDIOLOGY OFFICE NOTE   NAME:[name]                   MRN:          [id number]  DATE:05/18/2007                            DOB:          07-09-1929    [name] is in for follow-up.  He says he is doing the best he has done in  quite some time.  He has not had recurrent atrial fibrillation.  He  feels well overall.  He did have a biopsy of his left breast, which was  benign.  He and I have had an extensive discussion about this.  He is  off finasteride but has remained on Crestor.   CURRENT MEDICATIONS:  1. Coumadin as directed.  2. Voltaren 100 mg daily.  3. Digoxin 0.25 mg daily.  4. Tikosyn 500 mg b.i.d.  5. Crestor 10 mg daily.  6. Diovan 80 mg daily.  7. Coreg 12.5 mg p.o. b.i.d.   PHYSICAL EXAMINATION:  He is an alert and oriented gentleman in no acute  distress.  His blood pressure currently is 84/52.  When rechecked by me it was  actually 106/70.  The lung fields were clear.  The cardiac rhythm is regular, minimal systolic ejection murmur.  EXTREMITIES:  No edema.   EKG reveals atrioventricular pacing.  QTC is 401 msec.   IMPRESSION:  1. History of mild nonischemic cardiomyopathy.  2. History of mild coronary artery disease.  3. Hypercholesterolemia on lipid-lowering therapy.  4. History of recurrent atrial fibrillation with underlying sick sinus      syndrome.   PLAN:  1. With his blood pressure being borderline at home as well, we will      go ahead and discontinue the Diovan.  2. He will return to clinic in 4 months.  3. He will keep an eye on his blood pressures at home.  4. He will remain on Tikosyn at the present time.  5. We will get a lipid and liver profile as well as a basic metabolic      profile and call him with results.    [name]. [name], MD, [facility]  Electronically Signed   TDS/MedQ  DD: 05/18/2007  DT: 05/19/2007  Job #: [id number]

## 2010-07-22 NOTE — Assessment & Plan Note (Signed)
[facility]                            CARDIOLOGY OFFICE NOTE   NAME:[name], [name]                   MRN:          [id number]  DATE:09/02/2006                            DOB:          May 08, 1929    [name] is in for followup.  He is stable, he is tolerating his  medicines well.  He saw Dr. Ladona Ridgel.  We repeated his EKG today and his  QTC is 419 milliseconds.  He is in atrial ventricular pacing and  maintaining sinus rhythm.  He is feeling dramatically better.   His medications include:  1. Multivitamin.  2. Coumadin as directed.  3. Voltaren 100 mg daily.  4. Atacand 8 mg daily.  5. Finasteride 5 mg daily.  6. Digoxin 0.25 mg daily.  7. Crestor 10 mg daily.  8. Coreg 12.5 mg p.o. b.i.d.  9. Tikosyn 500 mg b.i.d.   PHYSICAL EXAMINATION:  The blood pressure is 100/58, the pulse is 68.  The lung fields are clear.  The cardiac rhythm is regular without a significant murmur.   EKG at rest reveals an atrial pacing with narrow complex QRS.  There is  delay in R wave progression with left axis deviation.  The QT interval  is not prolonged.   IMPRESSION:  1. Hypercholesterolemia on lipid lowering therapy with last LDL 84.  2. History of recurrent atrial fibrillation.  3. History of non-ischemic cardiomyopathy.  4. History of mild coronary artery disease.   DISPOSITION:  1. Continued medical therapy.  2. Followup clinic in 2 months.  3. Continue current medical regimen.     [name]. [name], MD, [facility]  Electronically Signed    TDS/MedQ  DD: 09/02/2006  DT: 09/02/2006  Job #: [id number]

## 2010-07-22 NOTE — Op Note (Signed)
NAME:  [name], [name]  [id number]   MEDICAL RECORD NO.:  [id number]          PATIENT TYPE:  OIB   LOCATION:  2899                         FACILITY:  [facility]   PHYSICIAN:  Bevelyn Buckles. Bensimhon, MDDATE OF BIRTH:  10-Nov-1929   DATE OF PROCEDURE:  05/09/2008  DATE OF DISCHARGE:                               OPERATIVE REPORT   PROCEDURE:  Direct current cardioversion.   PATIENT IDENTIFICATION:  [name] is a delightful 77 year old male  with a history of paroxysmal atrial fibrillation, maintained in sinus  rhythm on Tikosyn.  He began to develop recurrent symptomatic atrial  fibrillation.  Yesterday he came to the office for an unscheduled visit.  We confirmed that he was in atrial fibrillation for a duration of less  than 24 hours.  His INR has been therapeutic for well over a year and  most recent INR was 2.5.   He is scheduled for outpatient direct current cardioversion and short-  stay.   Consent was signed and placed on the chart.  Sedation was performed by  Dr. Krista Blue of  anesthesia.  He received 80 mg of IV propofol with good  sedation.  He then received 150-joule biphasic shock times one with  prompt conversion to atrial pacing.  There are no apparent  complications.      Bevelyn Buckles. Bensimhon, MD  Electronically Signed     DRB/MEDQ  D:  05/09/2008  T:  05/09/2008  Job:  [id number]

## 2010-07-22 NOTE — Assessment & Plan Note (Signed)
[facility]                            CARDIOLOGY OFFICE NOTE   NAME:Rarick, [name]                   MRN:          [id number]  DATE:03/01/2007                            DOB:          Mar 09, 1930    [name] is in for followup.  Following his last visit, I had spoken on  the phone with Dr. Vic Blackbird who thought that the finasteride  could be associated with his breast soreness.  He was on finasteride  which [place] apparently thought could, as well, be responsible for his  symptoms, and subsequently this has been discontinued.  In addition, the  mammogram revealed an area of focal asymmetry which [name] was  told could be associated with Crestor.  He and I had a rather extensive  discussion about this, and I spoke with Dr. Aldean Ast.  He continues to  have some of that discomfort, and the patient really does not think that  this is likely related to the Crestor, and he elected to resume this  while coming off of that at the same time.  Cardiac wise he feels like  he is doing great.  He is not significantly short of breath.  He has  talked to [employer] about it, and I think they are going to go ahead  and refer him to a Careers adviser.   MEDICATIONS:  1. Multivitamin daily.  2. Coumadin as directed.  3. Voltaren 100 mg daily.  4. Atacand 8 mg daily.  5. Finasteride 5 mg daily.  6. Digoxin 0.25 mg daily.  7. Coreg 12.5 mg b.i.d.  8. Tikosyn 500 mg b.i.d.  9. Crestor 10 mg daily.   PHYSICAL EXAMINATION:  He is alert and oriented.  His weight is 170 pounds, blood pressure 119/78, and the pulse is 71 and  regular.  Lung fields are clear.  Cardiac rhythm is regular.  Examination of the breast reveals the previously noted area in the left  breast.  There is no extremity edema.   PLAN:  1. The patient is to have surgical consultation.  2. He will continue on his current medical regimen.  3. The patient wishes to continue Crestor  until the above findings are      discussed.     [name]. [name], MD, [facility]  Electronically Signed    TDS/MedQ  DD: 03/18/2007  DT: 03/18/2007  Job #: [id number]

## 2010-07-22 NOTE — Assessment & Plan Note (Signed)
[facility]                         ELECTROPHYSIOLOGY OFFICE NOTE   NAME:[name], [name]                   MRN:          [id number]  DATE:08/30/2007                            DOB:          04-29-1929    [name] returns today for followup.  He is a very pleasant male  with a history of symptomatic bradycardia who is status post pacemaker  insertion, who returns today for followup.  The patient has a history of  paroxysmal Afib.  He has nonobstructive coronary disease in the past.  He has hypertension and dyslipidemia.  He has been maintained very  nicely in sinus rhythm on Tikosyn therapy.  The patient denies chest  pain.  He denies shortness of breath.   MEDICATIONS:  1. Digoxin 0.25 mg a day.  2. Tikosyn 500 mcg twice daily.  3. Crestor 10 mg a day.  4. Coreg 12.5 mg twice daily.  5. Voltaren 75 mg a day.  6. Coumadin as directed.   PHYSICAL EXAMINATION:  GENERAL:  He is a pleasant well-appearing man in  no acute distress.  VITAL SIGNS:  Blood pressure is 110/70, pulse is 76 and regular,  respirations were 18, and weight is 169 pounds.  NECK:  Revealed no jugular vein distention.  LUNGS:  Clear bilaterally auscultation.  No wheezes, rales, or rhonchi  are present.  CARDIOVASCULAR:  Regular rate and rhythm.  Normal S1 and S2.  EXTREMITIES:  Demonstrated no edema.   Interrogation of his pacemaker demonstrates St. Jude V3.  There are no P-  waves secondary to symptomatic bradycardia.  R-waves were 11.  The  impedance 397 in the A and 405 in the V.  The threshold is 0.5 at 0.5 in  the A and 0.625 at 0.5 in the RV.  The battery voltage was 2.78 volts.  The third mode switch is longest, which was 17 hours.   IMPRESSION:  1. Symptomatic bradycardia.  2. Paroxysmal atrial fibrillation.  3. Status post pacemaker insertion.  4. Hypertension.   DISCUSSION:  Overall, [name] is stable.  His pacemaker is working  normally.  His  Tikosyn has maintained him predominently in sinus rhythm.  We will plan to see the patient back for pacemaker followup in a year.     [name]. Ladona Ridgel, MD  Electronically Signed    GWT/MedQ  DD: 08/30/2007  DT: 08/31/2007  Job #: [id number]

## 2010-07-25 NOTE — Discharge Summary (Signed)
NAMECHONG, WOJDYLA NO.:  [id number]   MEDICAL RECORD NO.:  [id number]          PATIENT TYPE:  INP   LOCATION:  3729                         FACILITY:  [facility]   PHYSICIAN:  Charlton Haws, M.D.     DATE OF BIRTH:  [birth year]   DATE OF ADMISSION:  12/29/2004  DATE OF DISCHARGE:  01/01/2005                                 DISCHARGE SUMMARY   CARDIOLOGIST:  Learta Codding, M.D. [facility].   PRIMARY PHYSICIAN:  Valetta Mole. Swords, M.D. [facility].   CHIEF COMPLAINT:  Symptomatic atrial fibrillation.   SECONDARY DIAGNOSES:  1.  Sick sinus syndrome status post Trilogy pacemaker, July 1998.  2.  Ejection fraction of 41%.  3.  Dyslipidemia, chronically elevated liver function tests.  4.  Status post esophageal dilatation in 2000.  5.  Benign prostatic hypertrophy.   ALLERGIES:  ANTIHISTAMINES.   PROCEDURES PERFORMED DURING THIS HOSPITALIZATION:  Cardioversion performed  on December 31, 2004.   HISTORY OF PRESENT ILLNESS:  The patient is a 77 year old Caucasian male who  is referred to [facility] Emergency Room secondary to continued symptomatic  atrial fibrillation.  The patient went into atrial fibrillation on the  evening prior to admission with associated palpitations and shortness of  breath and dizziness who was given Lopressor in the office on Friday and was  admitted to the emergency room for further evaluation.   HOSPITAL COURSE:  Dr. Andee Lineman reviewed the patient's history and gave the  patient the alternatives to dealing with his atrial fibrillation:  One was  chemical AV ablation with increased beta-blocker.  2. Another was catheter  ablation.  3. Antiarrhythmic therapy with amiodarone versus Tikosyn and  cardioversion.  The patient decided to go with the amiodarone and have a  cardioversion.  Cardioversion was scheduled for Wednesday on December 31, 2004 and was successfully cardioverted with 100 joules.  The patient  remained in normal sinus rhythm and Dr. Eden Emms  deemed the patient stable to  be discharged to home.   DISCHARGE INSTRUCTIONS:  The patient will be discharged home on amiodarone  400 mg b.i.d.  He will follow up with Dr. [name]. Stuckey's P.A. in one to  two weeks.  Appointment was scheduled for October 31 at 11:30 a.m.  The  patient will also follow up with Coumadin clinic on Tuesday at 8:30 a.m.  The patient will also take 2.5 mg of Coumadin tonight and tomorrow and then  back to 5 mg after that.   DISCHARGE MEDICATIONS:  1.  Coumadin as noted above.  2.  Amiodarone 400 mg b.i.d.  3.  Voltaren 100 mg p.o. daily.  4.  Proscar 5 mg p.o. daily.  5.  Multivitamin daily.  6.  Prilosec over-the-counter as p.r.n.  7.  Altace 5 mg p.o. daily.  8.  Aspirin 81 mg p.o. daily.  9.  Crestor 20 mg p.o. daily.   DURATION OF ENCOUNTER:  Less than 30 minutes.     ______________________________  April Humphrey, NP    ______________________________  Charlton Haws, M.D.    AH/MEDQ  D:  01/01/2005  T:  01/02/2005  Job:  [id number]

## 2010-07-25 NOTE — H&P (Signed)
[facility] ADMISSION   NAME:[name], [name]                   MRN:          [id number]  DATE:06/07/2006                            DOB:          01/23/1930    CARDIOLOGIST:  Maisie Fus D. [name], M.D.   PRIMARY CARE PHYSICIAN:  Valetta Mole. Swords, M.D.   HISTORY OF PRESENT ILLNESS:  [name] is a very pleasant 77-year-  old male patient followed by Dr. [name] with a history of persistent of  atrial fibrillation who has failed amiodarone therapy secondary to  elevated LFTs and who is status post several cardioversions in the past.  He presents to the office today with complaints of recurrent atrial  fibrillation. I last saw him in February of 2008. He was set up for  outpatient cardioversion. This was done on February 18. He had to go  through a TEE-guided cardioversion secondary to low INRs. He has done  well since that time. However, this past weekend, he developed extreme  fatigue and shortness of breath with exertion. He denies chest pain. He  denies any syncope. He denies any orthopnea or paroxysmal nocturnal  dyspnea. Denies any lower extremity edema. He does note tachy  palpitations. His symptoms are very consistent with what he has had in  the past with his atrial fibrillation.   PAST MEDICAL HISTORY:  Please see the problem list below.   CURRENT MEDICATIONS:  1. Multivitamin daily.  2. Coumadin 5 mg daily except 2.5 mg on Tuesdays.  3. Voltaren 100 mg daily.  4. Atacand 8 mg daily.  5. Finasteride 5 mg daily.  6. Digoxin 0.25 mg daily.  7. Crestor 10 mg daily.  8. Coreg 12.5 mg twice daily.  9. Extra-strength Tylenol p.r.n.   ALLERGIES:  ANTIHISTAMINES.   SOCIAL HISTORY:  He denies any tobacco abuse.   FAMILY HISTORY:  Mother died at age 31 of a AAA. Father died at age 63  of sudden death.   REVIEW OF SYSTEMS:  Please see HPI. Denies any fever, chills. He has had  a cough recently.  There has been no hemoptysis. His upper respiratory  tract infection symptoms seemed to be resolving. Denies any melena,  hematochezia, hematuria, dysuria. The rest of the review of systems are  negative.   PHYSICAL EXAMINATION:  He is well-developed, well-nourished in no acute  distress. Blood pressure is 112/68, pulse 93, weight 179 pounds.  HEENT:  HEAD:  Normocephalic, atraumatic. EYES:  PERRLA. EOMI. Sclerae  are clear.  LYMPH:  Without lymphadenopathy.  NECK:  Without JVD.  CARDIAC:  Normal S1 and S2. Irregular rate, irregular rhythm.  LUNGS:  Clear to auscultation bilaterally without wheezes, rhonchi or  rales.  ABDOMEN:  Soft, nontender with normal active bowel sounds. No  organomegaly.  EXTREMITIES:  Without edema. Calves are soft and nontender.  SKIN:  Warm and dry.  NEUROLOGICAL:  He is alert and oriented x3. Cranial nerves II-XII are  grossly intact.   Electrocardiogram reveals underlying atrial fibrillation with a  ventricular rate of 92 with occasional V pacing, QTC 403 milliseconds.  IMPRESSION:  1. Recurrent persistent atrial fibrillation.      a.     History of amiodarone therapy in the past - discontinued       secondary to elevated liver function tests (he discontinued this       April of 2007).      b.     Status post multiple cardioversions in the past with the       most recent being February of 2008.  2. Sick sinus syndrome status post Victory DDD pacemaker.  3. Mild nonischemic cardiomyopathy with an ejection fraction of 45%.      Recent echocardiogram November 2007 with improved ejection fraction      of 60%.  4. History of nonobstructive coronary disease by catheterization in      2001.  5. Hyperlipidemia.  6. Benign prostatic hypertrophy.  7. History of esophageal stricture status post dilatation in 2000.  8. Status post umbilical hernia repair.  9. Peptic ulcer disease and diverticular bleed in November of 2006.   PLAN:  The patient presents to  the office today with complaints of  symptoms consistent with recurrent atrial fibrillation. He is indeed  back in atrial fibrillation by electrocardiogram. When I saw him last  February of 2008, Dr. Ladona Ridgel also saw him with me, and we discussed the  possibility of proceeding with Tikosyn therapy versus proceeding with  referral to [facility] for possible ablation of his atrial  fibrillation with Dr. Sampson Goon. I discussed the options with the  patient again today. He is interested in proceeding with Tikosyn  therapy. I discussed this further with Dr. Ladona Ridgel. We will bring him  into the hospital later this week on a day that Dr. Ladona Ridgel is there so  that he can initiate his Tikosyn therapy. He will be cardioverted  subsequent to Tikosyn loading. Of note, his recent INRs were as follows:  February 18 2.1, February 19 2.6, March 3 2.7, March 25 2.3, today 3.  The patient would like Dr. [name] to be apprised of the recent events.  I have spoken to his nurse who will be in touch with Dr. [name]  tomorrow. The patient will be contacted by Dr. Rosalyn Charters nurse tomorrow  to let him know the final decision  regarding the above. As long as there are no changes, he will be  admitted to the hospital Wednesday, April 2, for Tikosyn loading and  subsequent cardioversion.      [name], PA-C  Electronically Signed      [name]. Ladona Ridgel, MD  Electronically Signed   SW/MedQ  DD: 06/07/2006  DT: 06/07/2006  Job #: [id number]   cc:   Valetta Mole. Swords, MD

## 2010-07-25 NOTE — Op Note (Signed)
NAMEBROLY, HATFIELD NO.:  [id number]   MEDICAL RECORD NO.:  [id number]          PATIENT TYPE:  OIB   LOCATION:  2899                         FACILITY:  [facility]   PHYSICIAN:  Duke Salvia, MD, FACCDATE OF BIRTH:  1930/01/12   DATE OF PROCEDURE:  01/06/2006  DATE OF DISCHARGE:  01/06/2006                                 OPERATIVE REPORT   PREOPERATIVE DIAGNOSIS:  Atrial fibrillation.   POSTOPERATIVE DIAGNOSIS:  Sinus rhythm.   PROCEDURE:  DC cardioversion.   Following obtaining his informed consent the patient was sedated deeply.  The patient received a 200 joule shock synchronously in atrial fibrillation  and was thrown in sinus rhythm.  The patient's advice interrogation had  demonstrated that he was ventricularly pacing at 50% of the time and was in  rapid atrial fibrillation the rest of the majority of the time.   He will have a repeat assessment of his ejection fraction in two weeks as he  sees Dr. [name].   I should note also that the previously dictated note was in error.  Re-  assessment of his ejection fraction after the echocardiogram of May 2006 had  demonstrated ejection fraction of 40% by Myoview scanning in the late summer  of 2006.           ______________________________  Duke Salvia, MD, [facility]     SCK/MEDQ  D:  01/06/2006  T:  01/07/2006  Job:  [id number]

## 2010-07-25 NOTE — Assessment & Plan Note (Signed)
[place] [facility]                              CARDIOLOGY OFFICE NOTE   NAME:[name], [name]                   MRN:          [id number]  DATE:11/03/2005                            DOB:          19-Jan-1930    This is a 77 year old, white male patient who has a history of sick sinus  syndrome with paroxysmal atrial fibrillation, status post Victory DDD  pacemaker.  He also has a nonischemic cardiomyopathy with ejection fraction  45% with nonobstructive coronary artery disease.  He presents today stating  that he is in atrial fibrillation.  The patient said Friday night about 9:30  a.m., he just went into atrial fibrillation and just has not felt as well as  usual.  He has felt much worse in the past while in atrial fibrillation, but  was able to golf on Sunday and actually tried to work out on the elliptical  this morning.  He said he did light exercise, but got his heart rate up to  147.  Today, in the office, his heart rate shows ventricular rate 119 with a  magnet and 104 without.  He was told to take an extra half of Lopressor  yesterday, but felt actually worse after he did this.   CURRENT MEDICATIONS:  1. Multivitamin daily.  2. Crestor 20 mg daily.  3. Lopressor 25 mg b.i.d.  4. Coumadin as directed.  5. Voltaren 100 mg daily.  6. Atacand 8 mg daily.  7. Finasteride 5 mg daily.   PHYSICAL EXAMINATION:  GENERAL:  This is a pleasant, 77 year old, white male  in no acute distress.  VITAL SIGNS:  Blood pressure 81/55, pulse 91, weight 172.  NECK:  Without JVD, bruit or thyroid enlargement.  LUNGS:  Clear anterior and posterior and lateral.  HEART:  Irregularly, irregular at 100 beats per minute.  Normal S1, S2.  A  1/6 systolic murmur at the left sternal border.  ABDOMEN:  Soft without organomegaly, masses, lesions or abnormal tenderness.  EXTREMITIES:  Without clubbing, cyanosis or edema.  Good distal pulses.   EKG with magnet AV pacing,  irregular heart beat.  With no magnet, he is in  atrial fibrillation at 104 beats per minute.   IMPRESSION:  1. Paroxysmal atrial fibrillation with rapid ventricular rate.  2. Sick sinus syndrome treated with [facility] DDD pacemaker.  3. Mild nonischemic cardiomyopathy with ejection fraction 45%.  4. Nonobstructive coronary artery disease.  5. Hyperlipidemia.  6. She had been on amiodarone in the past, but had increased LFTs on      amiodarone.   PLAN:  At this time, because of his hypotension, I am going to add Lanoxin  0.25 mg to his medications.  I have told them to double up on his Lopressor  to 50 mg b.i.d. only if his blood pressure is over 100 systolic and he will  keep track of this.  He is to call us Friday if his heart rate does not slow  down.  He will follow up with [name], P.A. next Friday and Dr. [name]  in 2-3 weeks.  Jacolyn Reedy, PA-C                           Cecil Cranker, MD, [facility]   ML/MedQ  DD:  11/03/2005  DT:  11/03/2005  Job #:  [id number]

## 2010-07-25 NOTE — Assessment & Plan Note (Signed)
[facility]                            CARDIOLOGY OFFICE NOTE   NAME:Wickliffe, KEONTRE DEFINO                   MRN:          [id number]  DATE:05/25/2006                            DOB:          Jan 03, 1930    Mr. Riechers is in for followup.  In general, he is doing quite well.  He had to come back in and he ended up with a TEE guided cardioversion.  This demonstrated no left atrial mural thrombus.  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  He is feeling quite well and back to his  regular exercise regimen.   CURRENT MEDICATIONS INCLUDE:  1. A multivitamin daily.  2. Coumadin as directed.  3. Voltaren 100 mg daily.  4. Atacand 8 mg daily.  5. Finasteride 5 mg daily.  6. Digoxin 0.25 mg daily.  7. Crestor 10 mg daily.  8. Coreg 12.5 b.i.d.   PHYSICAL EXAM:  He is alert and oriented, in no acute distress.  The  weight is 173, blood pressure 102/56, pulse 70.  Lung fields are clear.  Cardiac rhythm is regular.  The pacer site looks good.  There is no  extremity edema noted.   The EKG reveals atrial pacing with ventricular tracking.   IMPRESSION:  1. Non-ischemic cardiomyopathy.  2. Mild coronary artery disease.  3. Hypercholesterolemia.  4. History of elevated liver function studies in the past.   PLAN:  1. Repeat lipid and liver.  2. Followup in clinic in six weeks.     [name]. [name], MD, [facility]  Electronically Signed    TDS/MedQ  DD: 05/25/2006  DT: 05/25/2006  Job #: [id number]

## 2010-07-25 NOTE — Op Note (Signed)
NAME:  FATEH, KINDLE NO.:  [id number]   MEDICAL RECORD NO.:  [id number]            PATIENT TYPE:   LOCATION:                                 FACILITY:   PHYSICIAN:  Willa Rough, M.D.          DATE OF BIRTH:   DATE OF PROCEDURE:  12/31/2004  DATE OF DISCHARGE:                                 OPERATIVE REPORT   PROCEDURE:  Cardioversion   The patient has been loaded with amiodarone and is now ready for  cardioversion.   Anesthesia is present and the patient received 150 mg of IV Pentothal.  Anterior-posterior pads were placed. The patient was given 100 joules of  biphasic energy; and he converted to sinus rhythm. His pacemaker will be  checked; and he is stable.           ______________________________  Willa Rough, M.D.     JK/MEDQ  D:  12/31/2004  T:  12/31/2004  Job:  [id number]   cc:   Learta Codding, M.D. [facility]  [address]  [address]  [place]  [place] [address]

## 2010-07-25 NOTE — Discharge Summary (Signed)
NAME:  [name], [name]  [id number]   MEDICAL RECORD NO.:  [id number]                   PATIENT TYPE:  INP   LOCATION:  4737                                 FACILITY:  [facility]   PHYSICIAN:  Salvadore Farber, M.D.             DATE OF BIRTH:  [birth year]   DATE OF ADMISSION:  04/11/2003  DATE OF DISCHARGE:  04/14/2003                                 DISCHARGE SUMMARY   PROCEDURE:  Direct current cardioversion.   HOSPITAL COURSE:  Mr. Shellia Carwin is a 77 year old male with a history of  paroxysmal atrial fibrillation and nonischemic cardiomyopathy who was last  cardioverted in 2004.  He also had nonobstructive coronary artery disease by  catheterization in 2001 and a mildly decreased EF at 45 to 50% by echo in  2002.  He is status post Pacesetter Trilogy pacemaker in [place] for sinus node  dysfunction as well.  He was admitted on April 11, 2003 for atrial  fibrillation with rapid ventricular response.   His medications were adjusted including increasing his beta blocker to 15 mg  p.o. t.i.d. and adding Cardizem 30 mg p.o. q. 6 h.  However, doses were held  on a regular basis secondary to hypotension.  His heart rate continued to be  difficult to control and with the blood pressure fluctuating from 80 to 110,  it was therefore decided that cardioversion was indicated and this was  performed on April 13, 2003 after confirming adequate anticoagulation.  He  had synchronized cardioversion (biphasic) times one and was in sinus rhythm.  His Coumadin was continued.  On April 14, 2003 he was maintaining sinus  rhythm and ambulating without chest pain or shortness of breath.  He was  considered stable for discharge on April 14, 2003 and is to follow up at  the Coumadin Clinic as scheduled on February 10 and with Dr. [name].  Stuckey in two to three weeks.  The final decision was that his Lopressor  would be increased to 50 mg b.i.d. with no Cardizem and  the Altace would be  decreased from 5 mg b.i.d. to 5 mg q.d.  Dr. [name] can reassess Mr.  Shellia Carwin and make further medication adjustments as an outpatient.  In the  meantime he knows to contact us if he feels that he is having either  hypotension secondary to medication or recurrent fib.  Additionally, he is  to advise the Coumadin Clinic that is INR was elevated at 4.3 upon  admission, but any dose adjustments will be made per the Coumadin clinic.   LABORATORY VALUES:  Hemoglobin 14.0, hematocrit 40.6, WBCs 9.1, platelets  229.  Sodium 141, potassium 4.1, chloride 113, CO2 25, BUN 23, creatinine  1.0, glucose 79.  Other CMET values within normal limits except total  protein was slightly low at 5.8.  INR on April 13, 2003 was 3.6.  INR on  April 11, 2003, was 4.3.   DISCHARGE  CONDITION:  Improved.   DISCHARGE DIAGNOSES:  1. Paroxysmal atrial fibrillation with rapid ventricular response, status     post direct current cardioversion this admission.  2. Status post umbilical hernia repair.  3. Status post noncritical coronary artery disease with a 70% OM by     angiogram in 2001.  4. Left ventricular dysfunction with an ejection fraction of 45 to 50% by     echocardiogram in 2002.  5. Chronic anticoagulation.  6. Status post dual chamber Pacesetter Trilogy pacemaker in July 1998.  7. Status post multiple cardioversions for paroxysmal atrial fibrillation.  8. Dyslipidemia.  9. History of mildly elevated liver enzymes.  10.      History of esophageal stricture with dilatation in 2000.  11.      History of benign prostatic hypertrophy.  12.      Family history of premature coronary artery disease.   DISCHARGE INSTRUCTIONS:  1. His activity level is to be as tolerated.  2. He is to stick to a diet that is low in fat and salt.  3. He is to follow up with Dr. Valetta Mole. Swords as scheduled.  4. He is to follow up ith the Coumadin Clinic as scheduled.  5. He is to follow up with  Dr. [name] and the office will call.   DISCHARGE MEDICATIONS:  1. Voltaren 100 mg q.d.  2. Proscar 5 mg q.d.  3. Lipitor 40 mg q.d.  4. Altace 5 mg q.d.  5. Protonix 40 mg q.d.  6. Aspirin 81 mg q.d.  7. Vitamins as prior to admission.  8. Lopressor 50 mg b.i.d.  9. Coumadin 5 mg q.d.      Theodore Demark, P.A. [facility]                  Salvadore Farber, M.D.    RB/MEDQ  D:  04/14/2003  T:  04/16/2003  Job:  [id number]   cc:   Valetta Mole. Swords, M.D. [facility]   Maisie Fus D. [name], M.D.   Shelby Dubin, M.D.

## 2010-07-25 NOTE — Cardiovascular Report (Signed)
NAME:  DEFORREST, BOGLE NO.:  [id number]   MEDICAL RECORD NO.:  [id number]          PATIENT TYPE:  OIB   LOCATION:  2855                         FACILITY:  [facility]   PHYSICIAN:  Charlies Constable, M.D. [facility] DATE OF BIRTH:  11/02/1929   DATE OF PROCEDURE:  04/14/2005  DATE OF DISCHARGE:                              CARDIAC CATHETERIZATION   PROCEDURE:  Explantation of the old Pacesetter Trilogy DR Plus generator  (model [id number], serial [id number], DOI  September 13, 1995). Inspection of the old  atrial lead (model [id number], serial [id number]). Pacesetter bipolar screw-in  the ventricular lead (model #1356T/52, serial [id number]), and insertion of a  new Victory XLDR DDD pacemaker (model [id number], serial [id number], beginning of  life 98.5 beats per minute, ERI 86.3 minutes).   CLINICAL HISTORY:  Mr. 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 He had a Trilogy DR Plus pacemaker  implanted by Dr. __________  in 1997. He recently reached ERI and was  brought in for generator placement. He has been maintained on amiodarone and  has maintained sinus rhythm. His Coumadin was held for four days prior to  the procedure. He has an ejection fraction of 55% and no known coronary  disease.   INDICATION:  ERI of the old Trilogy DR pacemaker.   ANESTHESIA:  1% local Xylocaine.   ESTIMATED BLOOD LOSS:  Less than 15 mL.   COMPLICATIONS:  None.   PROCEDURE:  The procedure was performed in laboratory room #5.  The right  anterior chest was prepped and draped in the usual fashion. The skin and  subcutaneous tissue were anesthetized with 1% local Xylocaine. An incision  was made just below the old incision and extended to the pocket. The pocket  was opened and the generator was removed. The leads were inspected and  interrogated with parameters as described below. The pocket was irrigated  with sterile kanamycin solution. The leads were attached to the generator  and the generator was implanted into the pocket. The subcutaneous tissue was  closed with running 2-0 Vicryl. The skin was closed with running 4-0 Vicryl.   PACING PARAMETERS:  Atrial lead: P-wave of 2.1 millivolts, __________ 0.5  volts. Resistance 443 ohms.   Ventricular lead: R-wave of 13.5 millivolts. __________ capture 0.8 volts.  Resistance 609 ohms.   The patient tolerated the procedure well and left the laboratory in  satisfactory condition.           ______________________________  Charlies Constable, M.D. [facility]     BB/MEDQ  D:  04/14/2005  T:  04/14/2005  Job:  [id number]   cc:   [name]. [name], M.D. [facility]  [address]  [address]  [place]  [place] [address]   Valetta Mole. Swords, M.D. [facility]  [address] [place]  [place] [address]

## 2010-07-25 NOTE — Assessment & Plan Note (Signed)
[facility]ALTHCARE                            CARDIOLOGY OFFICE NOTE   NAME:[name], [name]                   MRN:          [id number]  DATE:03/04/2006                            DOB:          July 01, 1929    [name] is in for a followup visit.  He generally is doing quite  well.  His echocardiogram suggested improved overall LV function.  He  denies any significant shortness of breath, and he seems to be staying  in sinus rhythm.  He is tolerating his Coreg well, although he does note  that he gets a little bit fatigued.   PHYSICAL EXAMINATION:  VITAL SIGNS:  On examination today, the blood  pressure is 120/72, pulse 74.  LUNGS:  Lung fields clear.  CARDIAC:  Rhythm is regular without a significant murmur.   His EKG reveals atrioventricular pacing.   Overall, he is improved.  We plan to see him back in followup in 3  months.  In the interim, our recommendations will be:  1. Continue current medical regimen.  2. Lipid and liver profile, as well as a basic metabolic profile.  3. He and I discussed Voltaren today and whether or not he ought to      remain on it long term.  He would like to potentially come off of      this.     [name]. [name], MD, [facility]  Electronically Signed    TDS/MedQ  DD: 03/04/2006  DT: 03/04/2006  Job #: [id number]

## 2010-07-25 NOTE — H&P (Signed)
NAME:  JUNG, YURCHAK NO.:  [id number]   MEDICAL RECORD NO.:  [id number]          PATIENT TYPE:  EMS   LOCATION:  MAJO                         FACILITY:  [facility]   PHYSICIAN:  Learta Codding, M.D. LHCDATE OF BIRTH:  1929/04/01   DATE OF ADMISSION:  12/29/2004  DATE OF DISCHARGE:                                HISTORY & PHYSICAL   BRIEF HISTORY:  Mr. Gurski is a 77 year old white male who is well known  to our practice and was referred to the [facility] emergency room secondary  to continued symptomatic atrial fibrillation.   On Thursday evening he stated that while walking from a lecture he went back  into atrial fibrillation with a rapid heart beat. He stated that he new this  because of the palpitations, associated shortness of breath and dizziness.  He saw Dr. Eden Emms on Friday in the office and Lopressor was increased to 50  milligrams t.i.d. from 50 milligrams b.i.d. and the EKG did confirm that he  was in atrial fibrillation. Over the weekend he stated that his symptoms did  not improve and actually he felt much worse. He experiences above symptoms  with any associated exertion and he feels that the symptoms improve with  rest. He denies any syncope. Today he called the office with his continued  symptoms and he was referred to the emergency room for further evaluation.   ALLERGIES:  Include ANTIHISTAMINE.   MEDICATIONS:  1.  Lopressor 50 milligrams t.i.d.  2.  Coumadin 5 milligrams except for Fridays 2.5 milligrams  3.  Voltaren 100 milligrams daily.  4.  Proscar 5 milligrams daily.  5.  Multivitamin daily.  6.  Prilosec over-the-counter daily.  7.  Altace 5 milligrams daily.  8.  Aspirin 81 daily.  9.  Crestor 20 daily.   MEDICAL HISTORY:  Is notable for atrial fibrillation/sick sinus syndrome.  His Coumadin was regulated at [facility]. Last check was on December 09, 2004 showed INR of 2.6. On review of his anticoagulation records, he has  had a therapeutic INR since July24. He has had cardioversions in September  of 2006, one in 2005, and possibly three in 2004. He is status post  Pacesetter trilogy pacemaker inserted on July of 1998. Cardiac  catheterization in 2001 showed EF 41%, global hypokinesis with 60-70% ostial  circumflex, 20-30% RCA spasm and 40-50% mid RCA lesion. He has a history of  dyslipidemia and a chronically elevated LFTs, status post esophageal  dilatation in 2000, BPH, status post umbilical hernia repair leaving him  without a belly button, status post left cataract surgery.   SOCIAL HISTORY:  He resides in [place] with his wife who was currently  sick at this time. He is semi-retired as an Museum/gallery curator. He has two sons, one daughter, five grandchildren, all alive and  well. He has not smoked since 43, denies any alcohol since 1998. Denies  drugs, herbal medication. He does not have a specific diet. He walks 18  holes of golf two to three times a week.   FAMILY HISTORY:  Mother died at the  age of 70 with an abdominal aortic  aneurysm rupture, history of TB. His father died at the age of 17 with  sudden death. He has no brothers or sisters.   REVIEW OF SYSTEMS:  Is notable for glasses, he developed a right eye  hemorrhage on Wednesday that is slightly worsened. He states that he has a  history of these one to two times per year and these usually resolve on  their own. In addition to the above symptoms, he also describes a chronic  dry cough which he attributes secondary to his medication, nocturia, GERD  symptoms.   PHYSICAL EXAM:  GENERAL:  Well-nourished, well-developed, pleasant white  male in no apparent distress.  VITAL SIGNS:  Blood pressure is 117/69, pulse was 113 and irregular,  respirations 22, saturations 93% on room air.  HEENT:  is unremarkable except for the hemorrhage in the right eye.  NECK:  Supple without thyromegaly, adenopathy, JVD or carotid bruits.   CHEST:  Symmetrical excursion. Lungs sounds were diminished but clear to  auscultation.  HEART:  PMI is not displaced. Irregular irregular rhythm. Did not appreciate  any murmurs, rubs, clicks or gallops. All pulses are symmetrical and intact  without abdominal or femoral bruits.  SKIN:  Integument is intact.  ABDOMEN:  Slightly obese, bowel sounds present without organomegaly, masses  or tenderness.  EXTREMITIES:  Negative cyanosis, clubbing or edema. Peripheral pulses were  symmetrical and intact.  MUSCULOSKELETAL:  NEUROLOGY:  Unremarkable.   EKG in the emergency room shows atrial fibrillation with a ventricular rate  of 129, normal axis, normal intervals, a paced beat and a delayed R-wave.   IMPRESSION:  Recurrent atrial fibrillation with a rapid ventricular rate,  onset Thursday December 25, 2004 with therapeutic INR since July and  associated with shortness of breath, weakness and dizziness. Past medical  history per past medical history as dictated above.   DISPOSITION:  Dr. Andee Lineman reviewed the patient's history, spoke with and  examined the patient. He has given the patient three alternatives; chemical  AV ablation with increased beta blocker, catheter based AV nodal  ablation,and  antiarrhytmic therapy with amiodarone versus Tikosyn and  cardioversion. Dr. Andee Lineman discussed these options at length with the  patient. We will begin the patient on p.o. amiodarone and schedule a  cardioversion for Wednesday.  In the meantime we will also increase his Lopressor to 50 milligrams q.i.d.  and continue his other medications. After discharge, he will need outpatient  PFTs with amiodarone therapy and outpatient echocardiogram to reassess his  LV function. We will check a TSH while he is in the hospital as well as the  usual labs.      Joellyn Rued, P.A. [facility]      Learta Codding, M.D. [facility]  Electronically Signed   EW/MEDQ  D:  12/29/2004  T:  12/29/2004  Job:  [id number]   cc:    Valetta Mole. Swords, M.D. [facility]  [address] [place]  [place] [address]   [name]. [name], M.D. [facility]  [address]  [address]  [place]  [place] [address]

## 2010-07-25 NOTE — Discharge Summary (Signed)
NAMEANDEN, BARTOLO NO.:  [id number]   MEDICAL RECORD NO.:  [id number]          PATIENT TYPE:  INP   LOCATION:  3729                         FACILITY:  [facility]   PHYSICIAN:  Charlton Haws, M.D.     DATE OF BIRTH:  May 04, 1929   DATE OF ADMISSION:  12/29/2004  DATE OF DISCHARGE:  01/01/2005                                 DISCHARGE SUMMARY   ADDENDUM   The patient's pro time was 33.1 and his INR was 3.2 on discharge.     ______________________________  April Humphrey, NP    ______________________________  Charlton Haws, M.D.    AH/MEDQ  D:  01/01/2005  T:  01/02/2005  Job:  [id number]

## 2010-07-25 NOTE — Assessment & Plan Note (Signed)
[facility]                              CARDIOLOGY OFFICE NOTE   Jacquees, Gongora NICHOLAUS STEINKE                   MRN:          [id number]  DATE:11/13/2005                            DOB:          1929-04-11    PRIMARY CARDIOLOGIST:  Dr. Shawnie Pons.   SUBJECTIVE:  Mr. Feagans is a very pleasant 77 year old male patient  followed by Dr. [name] with a history of paroxysmal atrial fibrillation and  sick sinus syndrome status post Victory DDD pacemaker implantation, mild  nonischemic cardiomyopathy of 45%.  The patient saw Alease Medina,  on November 03, 2005.  He was back in atrial fibrillation at that point in  time.  She placed him on Lanoxin 0.25 mg daily for rate control.  She was  unable to push up his Lopressor secondary to low blood pressures.  She did,  however, ask him to increase his Lopressor to 50 mg twice a day if his  systolic blood pressure was above 100.  He had done this about half the time  since he saw Melburn Popper.  The patient notes that he is doing about  the same as he was last week.  He is more fatigued than usual, and gets  short of breath when he walks up steps.  He denies any orthopnea or  paroxysmal nocturnal dyspnea.  He denies any dyspnea at rest.  He denies any  chest pain.  He denies any syncope or presyncope.  He does occasionally feel  palpitations.   CURRENT MEDICATIONS:  1. Multivitamin a day.  2. Lopressor 25 mg twice a day.  3. Coumadin as directed.  4. Voltaren 100 mg daily.  5. Atacand 81 mg daily.  6. Finasteride 5 mg daily.  7. Digoxin 0.25 mg daily.  8. Crestor 10 mg q.h.s.  9. Extra Strength Tylenol p.r.n.  10.Lopressor 25 mg p.r.n.   ALLERGIES:  ANTIHISTAMINES.   PHYSICAL EXAMINATION:  He is a well-nourished, well-developed male in no  distress.  Blood pressure is 95/55, pulse is 72, weight 173 pounds.  HEENT:  Unremarkable.  NECK:  Without jugular venous distension (JVD).  COR:   Normal, S1, S2.  Irregularly irregular rhythm.  LUNGS:  Clear to auscultation bilaterally.  ABDOMEN:  Soft, nontender, has normal active bowel sounds, no organomegaly.  EXTREMITIES:  Without edema.  Calves are soft, nontender.  NEUROLOGIC:  He is alert and oriented x3.  Cranial nerves II-XII are grossly  intact.   Electrocardiogram reveals a ventricular paced rhythm with a rate of 71.  Underlying rhythm looks to be atrial fibrillation.   DATABASE:  The patient's INRs since June 19 are as follows:  June 19, 1.7.  July 2, 3.0.  July 19, 3.1.  August 16, 2.6.  Today, 4.4.   IMPRESSION:  1. Paroxysmal atrial fibrillation, now with a controlled ventricular rate.  2. Sick sinus syndrome, status post Victory DDD pacemaker.  3. Mild nonischemic cardiomyopathy near 45%.  4. History of nonobstructive coronary disease.  5. Hyperlipidemia.  6. History of amiodarone therapy in the past.      a.  Discontinued secondary to elevated LFTs.  7. History of benign prostatic hypertrophy.  8. History of esophageal stricture, status post dilatation in 2000.  9. History of umbilical hernia repair.  10.History of peptic ulcer disease and diverticular bleed November 2006.   PLAN:  The patient presents to the office today for followup on his  paroxysmal atrial fibrillation.  His rate is better controlled now with  digoxin.  He does note continued fatigue, but feels as though his fatigue is  much better than it has been in the past prior to cardioversions.  He has  had several cardioversions in the past.  Dr. [name] has actually  recommended possibly referring him to [facility] for atrial fibrillation  ablation should he have recurrence of this rhythm.  The patient's INRs over  the last several weeks have been therapeutic.  I offered him the possibility  of going over to [facility] on Monday for outpatient cardioversion.  At this point in time he declines.  He would rather wait to see Dr.  [name]  back in a couple of weeks.  I think this is reasonable.  He knows that if he  feels any worse between now and the time he sees Dr. [name] back, he should  call us and come in for an appointment.  Otherwise he will continue on his  digoxin.  We will check a __________ and a digoxin level today.  With his  history of GI bleeds in the past and his fatigue, we will go ahead and check  a CBC as well.  He will follow up with Dr. [name] in 2 weeks, as noted  above.                                  [name], PA-C                           Madolyn Frieze. Jens Som, MD, [facility]   SW/MedQ  DD:  11/13/2005  DT:  11/14/2005  Job #:  [id number]

## 2010-07-25 NOTE — Discharge Summary (Signed)
NAMERAFIK, KOPPEL NO.:  [id number]   MEDICAL RECORD NO.:  [id number]          PATIENT TYPE:  INP   LOCATION:  2018                         FACILITY:  [facility]   PHYSICIAN:  Gerrit Friends. Dietrich Pates, MD, FACCDATE OF BIRTH:  1929/03/21   DATE OF ADMISSION:  06/09/2006  DATE OF DISCHARGE:  06/13/2006                               DISCHARGE SUMMARY   PRIMARY CARDIOLOGIST:  Dr. Shawnie Pons   ELECTROPHYSIOLOGIST:  Dr. [name]   PRIMARY CARE PHYSICIAN:  Dr. Birdie Sons   PROCEDURES PERFORMED DURING HOSPITALIZATION:  None.   PRIMARY DISCHARGE DIAGNOSES:  1. Recurrent atrial fibrillation.        A:  Status post Tikosyn load.  1. Sick sinus syndrome status post pacemaker.  2. Nonischemic cardiomyopathy with an EF of 45%.   SECONDARY DIAGNOSES:  1. Esophageal stricture.  2. Peptic ulcer disease with diverticular bleed.   HISTORY OF PRESENT ILLNESS:  This is a 77 year old Caucasian male  followed by Dr. [name] and Dr. [name] with persistent atrial  fibrillation who failed amiodarone therapy secondary to elevated LFTs  who is status post several cardioversions in the past.  The patient was  admitted for Tikosyn loading and followup with Dr. [name] during  this hospitalization.   On June 10, 2006, the patient was seen and examined by Dr. [name]  and Mr. Hyacinth Meeker, physician assistant.  The patient had usual blood work  prior to [employer] loading and was found to be a candidate.  The patient's  digoxin level was 0.7, PT 26.9 with an INR of 2.3 with continued  Coumadin therapy.  The patient's QT interval was 0.320 unchanged from  previous EKG and monitored throughout hospitalization during Tikosyn  loading.   Over the weekend, the patient was seen and examined by Dr. Darbydale Bing and was found to be stable with no prolongation of QT interval.  The patient will be discharged home on continued Tikosyn therapy with  quick followup with Dr. [name] after discharge.  On day of  discharge, the patient was seen and examined by Dr. Dietrich Pates and found  to be stable.   VITAL SIGNS ON DISCHARGE:  Blood pressure 97/63, heart rate 70,  respirations 20, temperature 97.3.   LABS:  On date of admission, the patient's hemoglobin was 13.9,  hematocrit 40.7, white blood cells 9.6, platelets 303, sodium 137,  potassium 4.5, chloride 105, CO2 25, BUN 20, creatinine 0.82, glucose  120.  On day of discharge, the patient's PT was 28.4, INR 2.5, AST 23,  ALT 25, alkaline phosphatase 52, total bili 0.8, albumin was 3.3,  calcium 8.9, digoxin level was 0.7.   FOLLOWUP PLANS AND APPOINTMENTS:  1. The patient will see Dr. Ladona Ridgel within two weeks status post      discharge.  The office will call to make that appointment.  2. He will continue to follow with Dr. [name] and see him on May 2 at      a previously scheduled appointment.  3. The patient has been given medication, Tikosyn, through our      pharmacy to  hold him over until his Tikosyn can arrive by mail, two      prescriptions have been provided.   Time spent with the patient to include physician time is 30 minutes.      Bettey Mare. Lyman Bishop, NP      Gerrit Friends. Dietrich Pates, MD, [facility]  Electronically Signed    KML/MEDQ  D:  06/13/2006  T:  06/13/2006  Job:  [id number]   cc:   Valetta Mole. Swords, MD

## 2010-07-25 NOTE — Assessment & Plan Note (Signed)
[facility]                            CARDIOLOGY OFFICE NOTE   [name], [name]                     MRN:          [id number]  DATE:04/22/2006                            DOB:          1930-01-10    CARDIOLOGIST:  Dr. Shawnie Pons.   PRIMARY CARE PHYSICIAN:  Dr. Birdie Sons.   HISTORY OF PRESENT ILLNESS:  [name] is a 77 year old male patient  followed by Dr. [name] with a history of persistent atrial fibrillation  who has failed amiodarone therapy secondary to elevated LFTs and who is  status post several cardioversions in the past who presents to the  office today with recurrent palpitations.  He feels as though he went  back into atrial fibrillation night before last.  He feels fatigued with  this as well as short of breath with any type of exertion.  He denies  chest pain or syncope.  Denies any orthopnea or paroxysmal nocturnal  dyspnea.  Denies any lower extremity edema.   CURRENT MEDICATIONS:  1. Multivitamin daily.  2. Coumadin as directed.  3. Voltaren 100 mg daily.  4. Atacand 8 mg daily.  5. Finasteride 5 mg daily.  6. Digoxin 0.25 mg daily.  7. Crestor 10 mg daily.  8. Coreg 12.5 mg twice daily.   ALLERGIES:  ANTIHISTAMINES.   SOCIAL HISTORY:  He is an ex-smoker.   REVIEW OF SYSTEMS:  Please see HPI.  Denies any cough, hemoptysis,  melena, hematochezia, hematuria, hematemesis.  The rest of the review of  systems are negative.   PHYSICAL EXAMINATION:  He is well-nourished, well-developed male in no  acute distress.  Blood pressure is 98/58, pulse 103, weight 171 pounds.  HEENT:  Unremarkable.  NECK:  Without JVD.  LYMPH:  Without lymphadenopathy.  CARDIAC:  Normal S1, S2, regular rate and rhythm.  LUNGS:  Clear to auscultation bilaterally without wheezing, rhonchi, or  rales.  ABDOMEN:  Soft, nontender, with normoactive bowel sounds, no  organomegaly.  EXTREMITIES:  Without edema, calves are soft,  nontender.  SKIN:  Warm and dry.  NEUROLOGIC:  He is alert and oriented x3, cranial nerves II-XII grossly  intact.  ECHOCARDIOGRAM:  Reveals atrial fibrillation with a heart rate of 103,  occasional paced beat.   IMPRESSION:  1. Recurrent, persistent atrial fibrillation.      a.     History of amiodarone therapy in the past - discontinued       secondary to elevated liver function tests.      b.     Status post multiple cardioversions in the past.  2. Sick sinus syndrome.  Status post Victory DDD pacemaker.  3. Mild nonischemic cardiomyopathy with an ejection fraction of 45%.      a.     Recent echocardiogram November 2007 with an improved       ejection fraction to 60%.  4. History of nonobstructive coronary disease.  5. Hyperlipidemia.  6. Benign prostatic hypertrophy.  7. Esophageal stricture.  Status post dilatation 2000.  8. Status post umbilical hernia repair.  9. Peptic ulcer disease and diverticular bleed November 2006.  PLAN:  The patient presents to the office today with recurrent,  persistent atrial fibrillation.  He has been through several  cardioversions in the past and has failed amiodarone therapy secondary  to elevated LFTs.  Dr. Ladona Ridgel also saw the patient today.  The patient  is planning on going out of town for a vacation next week.  He would  like to undergo cardioversion before leaving for his trip.  He is  symptomatic with his atrial fibrillation.  His rate has fair control at  this point in time.  His blood pressure does not allow for [place] to  increase his Coreg any.  He actually did increase his Coreg from 6.25 to  12.5 mg twice a day the other night when he went out of rhythm.  Apparently he had discussed this with Dr. [name] in the past.  He is  also on the maximum dose of Digoxin.  Upon review of his INRs, his INR  was 1.4 December 13, December 31 it was 1.6, January 14 was 1.7, and  February 5 was 1.7.  We discussed with the patient the possibility  of  waiting to have his INR therapeutic for 3 weeks prior to cardioversion  versus proceeding with transesophageal echocardiogram guided  cardioversion.  He prefers to proceed with the TEE guided cardioversion  so that he can go on his trip next week.  We will check his PT and INR  today in the office and set him up for sometime early next week to  undergo the procedure.  He will need to come back and follow up with Dr.  [name] in the next few weeks to discuss further treatment options.  One  option could be Tikosyn and Dr. Ladona Ridgel discussed this with the patient  today.  He explained to the patient that just going through  cardioversion may not result in him staying in sinus rhythm for very  long.  He would most likely require another antiarrhythmic.  The patient  can discuss the possibility of Tikosyn therapy in the future further  with Dr. [name] when he sees him back in followup.  According to the  records it appears that  Dr. [name] has also discussed the possibility of referring the patient  to [facility] for atrial fibrillation ablation.  Again this can all be  revisited when he sees Dr. [name] in followup.      Craig Newcomer, PA-C  Electronically Signed      [name]. Ladona Ridgel, MD  Electronically Signed   SW/MedQ  DD: 04/22/2006  DT: 04/22/2006  Job #: [id number]   cc:   Craig Mole. Swords, MD

## 2010-07-25 NOTE — Op Note (Signed)
NAME:  [name], [name]  [id number]   MEDICAL RECORD NO.:  [id number]                   PATIENT TYPE:  OIB   LOCATION:  2887                                 FACILITY:  [facility]   PHYSICIAN:  Guadelupe Sabin, M.D.             DATE OF BIRTH:  05/03/1929   DATE OF PROCEDURE:  10/30/2003  DATE OF DISCHARGE:                                 OPERATIVE REPORT   PREOPERATIVE DIAGNOSIS:  Senile nuclear cataract, left eye.   POSTOPERATIVE DIAGNOSIS:  Senile nuclear cataract, left eye.   OPERATION PERFORMED:  Planned extracapsular cataract extraction,  phacoemulsification, primary insertion of posterior chamber intraocular lens  implant.   SURGEON:  Guadelupe Sabin, M.D.   ASSISTANT:  Nurse.   ANESTHESIA:  Local 4% Xylocaine, 0.75% Marcaine retrobulbar block.  Topical  tetracaine, intraocular Xylocaine.  Anesthesia standby required in this  cardiac patient.  Patient given sodium pentothal intravenously during the  period of retrobulbar blocking.   DESCRIPTION OF PROCEDURE:  After the patient was prepped and draped, a lid  speculum was inserted in the left eye.  The eye was turned downward and the  superior rectus traction suture was placed.  Schiotz tonometry was recorded  as 10 scale units with a 5.5 g weight.  A peritomy was performed adjacent to  the limbus from the 11 to the 1 o'clock position.  The corneoscleral  junction was cleaned and a corneoscleral groove made with a 45 degree  superblade.  The anterior chamber was then entered with a 2.5 mm diamond  keratome at the 12 o'clock position and the 15 degree blade at the 2:30  position.  Using a bent 26 gauge needle on a OcuCoat syringe, a  capsulorrhexis was begun.  Pupillary dilation, however, was minimal at 4 mm  and it was elected to stretch the pupil with the Kuglen hook and Holgate lens  rotator.  This increased the pupil size to 6 to 7 mm.  The circular  capsulorrhexis was then completed  with the Grabow forceps.  Hydrodissection  and hydrodelineation were performed using 1% Xylocaine.  The 30 degree  phacoemulsification tip was then inserted with slow, controlled  emulsification of the lens nucleus with back cracking with the Bechert pick.  Total ultrasonic time was 45 seconds, average power level 18%, total amount  of fluid used 90 mL.  Following removal of the nucleus, the residual cortex  was aspirated with the irrigation aspiration tip.  The posterior capsule  appeared intact with a brilliant red fundus reflex.  It was therefore  elected to insert an [employer] Opt[employer] [id number] silicone three-piece  posterior chamber intraocular lens implant, diopter strength +21.50.  This  was inserted with a McDonald forceps into the anterior chamber and then  centered into the capsular bag using the [facility] lens rotator.  The lens  appeared to be well centered.  The Healon and OcuCoat which had been used  during  the procedure was aspirated and replaced with balanced salt solution  and Miochol ophthalmic solution.  The operative incision appeared to  slightly irregular and it was elected to place two 10-0 interrupted nylon  sutures across the incision to ensure closure and to prevent  endophthalmitis.  Maxitrol ointment was instilled in the conjunctival cul-de-  sac.  The patient was given Diamox 500 mg intravenously to lower the  intraocular pressure to prevent the postoperative elevation which occurred  after operating on his other eye.                                              Guadelupe Sabin, M.D.   HNJ/MEDQ  D:  10/30/2003  T:  10/30/2003  Job:  [id number]

## 2010-07-25 NOTE — Letter (Signed)
January 05, 2006     [name]. [name], MD, [facility]  [address]  [address]  [place], [place] [address]   RE:  Craig, [name]  MRN:  [id number]  /  DOB:  1929-12-19   Dear [name]:   It was a pleasure to see [name] at your request regarding his atrial  fibrillation.   As you know, he is a 77 year old gentleman with a history of atrial  fibrillation that dates back about 12 years or so.  It was found to occur in  the context of a cardiomyopathy, the question of which was raised whether it  was alcohol related.  It apparently got better with abstinence.   The patient also had bradycardia at that time prompting pacemaker  implantation by Dr. Lemmie Evens and this was recently changed out by  Dr. Charlies Constable.   Over the ensuing couple of years he had recurrent atrial fibrillation about  annually.  He underwent a total of about three or four cardioversions.   Because of recurrent atrial fibrillation in October 2006, he was started on  amiodarone. This was discontinued a couple of months later because of LFT  abnormalities and about eight weeks ago he started developing symptoms again  with exercise intolerance, daytime somnolence, some easy fatigability and he  is noted to be back in atrial fibrillation.   Of note, his left ventricular function is not normal.  Review of his  ultrasounds demonstrated that in June 2006, his ejection fraction was noted  to be 25-35%.  This is down from 2002 where it was 45-50%.  Myoview scanning  in 2000 had demonstrated ejection fraction of 40%.  Catheterization even  more remotely had demonstrated normal coronary arteries.   His thromboembolic risk factors are notable for heart failure and borderline  age of 40.   His cardiac risk factors are similar.  He also has a dyslipidemia which is  treated.   PAST MEDICAL HISTORY:  His past medical history is broadly negative apart  from GE reflux disease.   REVIEW OF SYSTEMS:  Also  negative.   PAST SURGICAL HISTORY:  Notable for an umbilical hernia.   CURRENT MEDICATIONS:  1. Crestor 10.  2. Atacand 8.  3. Finasteride 5.  4. Coumadin.  5. Metoprolol Tartrate 50 b.i.d.  6. Diclofenac.  7. Digoxin 0.25.   ALLERGIES:  He is allergic to ANTIHISTAMINES.   PHYSICAL EXAMINATION:  GENERAL APPEARANCE:  He is an elderly Caucasian male  in no acute distress appearing somewhat younger than his stated age.  VITAL SIGNS:  His blood pressure today was 122/70, his pulse was 76 and  irregular but when he got up to the table it was up to about 110.  HEENT:  No icterus or xanthoma.  NECK:  The neck veins were 8 cm.  The carotids were brisk and full  bilaterally without bruits.  BACK:  Without kyphosis or scoliosis.  LUNGS:  Clear.  CARDIOVASCULAR:  Heart sounds were irregular with a displaced PMI.  ABDOMEN:  Soft with active bowel sounds without hepatomegaly.  Femoral  pulses were 2+.  EXTREMITIES:  Distal pulses were intact.  There was no clubbing, cyanosis,  or edema.  NEUROLOGIC:  Grossly normal.   Electrocardiogram demonstrated that he had atrial fibrillation underlying  with fibrillation waves of 0.2 to 0.6.  He has a slow ventricular rate with  an R-wave of 10.2, impedance of 482 and a threshold of 1 volt at 0.4.  We  cannot tell much about his ventricular pacing because of his transition to  atrial fibrillation but he is in atrial fibrillation since the end of August  corresponding with his symptoms.   IMPRESSION:  1. Paroxysmal, now persistent atrial fibrillation - recurrent.  2. Previous exposure to amiodarone, discontinued because of liver function      test abnormalities.  3. Cardiomyopathy, question mechanism with ejection fraction a year and a      half ago of 25% and now new onset heart failure aggravated by his      atrial fibrillation.  4. Thromboembolic risk factors notable for borderline age and heart      failure.  5. Gastroesophageal reflux  disease.   [name] has significant deterioration in his symptoms of late.  He is  in atrial fibrillation.  I think a restoration of sinus rhythm and then  maintenance of sinus rhythm becomes important.  I would suspect that we will  likely end up using Tikosyn but I think it would be easier just to get him  cardioverted now and we can make the decision a little bit later.   The next question is why is his cardiomyopathy so much worse and I would  recommend that we consider catheterization.  I have tentatively scheduled  him to come back and see Dr. [name] in about two weeks to review that  possibility.  In that context, we will make decisions about what the  appropriate drug therapy is and what else needs to be done.   I have also taken the liberty of discontinuing his Lopressor and putting him  on Carvedilol rather than on the short-acting Lopressor.  He is agreeable to  this.   [name], thank you very much for asking me to see him and will plan to take care  of his cardioversion tomorrow.    Sincerely,     ______________________________  Duke Salvia, MD, [facility]    SCK/MedQ  DD: 01/05/2006  DT: 01/06/2006  Job #: [id number]

## 2010-07-25 NOTE — Assessment & Plan Note (Signed)
[facility]                              CARDIOLOGY OFFICE NOTE   NAME:[name], PATRIK TURNBAUGH                   MRN:          [id number]  DATE:12/10/2005                            DOB:          January 01, 1930    [name] is in for followup.  He actually is doing pretty well.  He is going  to go to the [place] football game on Saturday.  He is not having any chest  pain.  He does not feel quite as well as he has, and this is clearly related  to not being in sinus rhythm.  He has had problems with amiodarone in the  past, and we have subsequently had to stop this with elevated liver function  abnormalities.  Based on this, we have not resumed amiodarone therapy.  He  has not been on any other antiarrhythmic therapy.   CURRENT MEDICATIONS:  1. Coumadin as directed.  2. Voltaren 100 mg daily.  3. Atacand 8 mg daily.  4. Finasteride 5 mg daily.  5. Digoxin 0.25 mg daily.  6. Crestor 10 mg daily.  7. Lopressor 50 mg p.o. t.i.d.   PHYSICAL EXAMINATION:  The blood pressure is actually 108/64, and the pulse  is 89.  The lung fields are clear.  The pacemaker site is well healed.  There is an irregularly irregular rhythm.  There is no extremity edema.   His EKG reveals atrial fibrillation.  He appears to be overriding his  pacemaker except for 1 paced beat.  There is some atrial non-sensing.   [name] is stable.  He clearly does not feel quite as well when he is in  atrial fibrillation, and as a result of this I think we probably would have  to consider other options.  Other options included would be cardioversion  followed by admission to the hospital, with initiation of therapy with an  antiarrhythmic drug.  Another alternative would be to consider an ablation.  We could try to keep him in sinus rhythm, but he is modestly symptomatic  with this and so this will be difficult.  I will  discuss this with my electrophysiologic colleagues, and we will come to  a  final decision as to best treatment option.       [name]. [name], MD, [facility]     TDS/MedQ  DD:  12/10/2005  DT:  12/12/2005  Job #:  [id number]

## 2010-07-25 NOTE — Assessment & Plan Note (Signed)
[facility]                         ELECTROPHYSIOLOGY OFFICE NOTE   NAME:[name], [name]                   MRN:          [id number]  DATE:06/22/2006                            DOB:          Mar 22, 1929    [name] returns today for followup. He is a very pleasant, 77-year-  old man with a history of nonischemic cardiomyopathy and congestive  heart failure and atrial fibrillation who had failed amiodarone therapy  and who was started on Tikosyn earlier this month. He was cardioverted  and has maintained sinus rhythm every since. Presently on Tikosyn 500  mcg daily. He returns today for followup. He denied chest pain or  shortness of breath or palpitations. He has had no symptoms of A fib  since being cardioverted back to sinus rhythm. He denies shortness of  breath or peripheral edema.   MEDICATIONS:  Multivitamin, Coumadin, Atacand, Finasteride, digoxin,  Crestor, Coreg, and Tikosyn.   PHYSICAL EXAMINATION:  GENERAL:  Notable for a pleasant, well-appearing  man in no distress.  VITAL SIGNS:  Blood pressure 110/63, the pulse 75 and regular,  respirations were 18, the weight was 174 pounds.  NECK:  Revealed no jugular venous distention.  LUNGS:  Clear bilaterally to auscultation. There were no wheezes, rales  or rhonchi.  CARDIOVASCULAR:  Revealed a regular rate and rhythm with normal S1 and  S2.  EXTREMITIES:  Demonstrated no edema.   EKG demonstrates sinus rhythm with atrial pacing and ventricular  sensing.   Interrogation of is pacemaker demonstrates atrial and ventricular lead  impedance of 397 and 550 ohms respectively. He was 96% Apaced and 2.7%  Asensed.   IMPRESSION:  1. Symptomatic bradycardia.  2. Paroxysmal/persistent atrial fibrillation.  3. Status post Tikosyn therapy.   DISCUSSION:  Overall, [name] is stable, his pacemaker is working  normally and he has maintained sinus rhythm very nicely on Tikosyn. I  will  have him followup with Dr. [name] and Dr. Juanda Chance as previously  scheduled. I will see him back on a p.r.n. basis. Of note, his QT  interval is 405 msec.     [name]. Ladona Ridgel, MD  Electronically Signed    GWT/MedQ  DD: 06/22/2006  DT: 06/22/2006  Job #: [id number]

## 2010-07-25 NOTE — Assessment & Plan Note (Signed)
[facility]                              CARDIOLOGY OFFICE NOTE   NAME:Minton, [name]                   MRN:          [id number]  DATE:10/08/2005                            DOB:          1929-07-05    [name] is in for follow-up.  Since I last saw him, he has resumed Crestor.  He was initially taking 10 now, then 20.  He has not resumed his Amiodarone,  but has not had recurrent episodes of atrial fibrillation.  He has tolerated  this.  His liver function studies have returned to normal.   MEDICATIONS:  1.  Multivitamin, one daily.  2.  Crestor 10 mg, daily.  3.  Lopressor 25 mg, p.o., b.i.d.  4.  Coumadin as directed.  5.  Voltaren, one daily  6.  Atacand 8 mg, daily.  7.  Finasteride, daily.   PHYSICAL EXAMINATION:  GENERAL:  He is an alert, oriented gentleman, no  distress.  VITAL SIGNS:  Blood pressure  102/64, pulse 70.  LUNG FIELDS:  Clear.  CARDIAC RHYTHM:  Regular.  EXTREMITIES:  Do not reveal significant edema.   IMPRESSION:  1.  Non-ischemic cardiomyopathy with stable global hypokinesis.  2.  Mild scattered coronary artery disease with multiple luminal      abnormalities noted on the previous catheterization.  3.  Paroxysmal atrial fibrillation.  4.  Permanent pacemaker implantation, secondary to atrial fibrillation with      prolonged termination pauses.   PLAN:  1.  Return for lipid liver profile early next week, with repeat also a basic      metabolic profile for monitoring of potassium, BUN and creatinine (last      BUN 27, although the creatinine normal and potassium 4.7.  2.  Crestor 10 mg.  If he is above his target LDL, then we may potentially      add Zetia to his      regimen, to try to control his LDL cholesterol.  3.  Continued followup in clinic.                             [name]. Craig Kill, MD, [facility]    TDS/MedQ  DD:  10/08/2005  DT:  10/08/2005  Job #:  [id number]

## 2010-07-25 NOTE — H&P (Signed)
NAME:  [name], CLARDY NO.:  [id number]   MEDICAL RECORD NO.:  [id number]                   PATIENT TYPE:  OIB   LOCATION:  NA                                   FACILITY:  [facility]   PHYSICIAN:  Guadelupe Sabin, M.D.             DATE OF BIRTH:  1929/07/01   DATE OF ADMISSION:  10/30/2003  DATE OF DISCHARGE:                                HISTORY & PHYSICAL   REASON FOR ADMISSION:  This was a planned outpatient surgical readmission of  this 77 year old white male admitted for cataract/implant surgery of the  left eye.   PRESENT ILLNESS:  This patient has been noted to have progressive cataract  formation in both eyes.  He was previously admitted for cataract/implant  surgery of the right eye on June 19, 1997.  The patient, postoperatively,  developed transient high intraocular pressure which gradually subsided over  24-48 hours and the patient did well without glaucoma medication.  Recently,  he has noted similar deterioration of vision in the previously noted  cataract of the left eye.  Vision was recorded at 20/300, compared to 20/20  in the operated right eye.  He felt that he was having difficulty with glare  sensation, difficulty with smoky or blurred vision, difficulty reading road  signs and night vision with glare; he therefore elected to proceed with  cataract/implant surgery of the left eye at this time.   PAST MEDICAL HISTORY/MEDICATIONS:  The patient is under the care of Dr.  Cato Mulligan and Dr. [name].  The patient has had a pacemaker inserted and is  currently on Coumadin and aspirin under Dr. Rosalyn Charters direction.  He is  noted to have other medications at the present time including Lopressor,  Lipitor, Voltaren, Proscar, Protonix, multivitamin tablets and Altace.   ALLERGIES:  He has no known allergies.   REVIEW OF SYSTEMS:  No current cardiorespiratory complaints.   PHYSICAL EXAMINATION:  VITAL SIGNS AS RECORDED ON ADMISSION:   Temperature  97.1, heart rate 73, respirations 16, blood pressure 106/58.  GENERAL APPEARANCE:  The patient is a pleasant, well-nourished, well-  developed, alert, 77 year old white male in no acute distress.  HEENT:  Eyes:  Visual acuity is recorded above.  Slit lamp examination:  The  eyes are white and clear with clear corneae, deep and clear anterior  chambers.  A posterior chamber implant is present in the right eye with  slight posterior capsule haze.  The left eye shows nuclear and cortical  cataract formation.  Applanation tonometry -- 12 mm, right eye; 14, left  eye.  Dilated detailed fundus examination shows a posterior implant, a clear  vitreous, attached retina with normal optic nerve, blood vessels and macula,  right eye.  The left eye shows cataract haze with similar retinal findings.  CHEST:  Lungs clear to percussion and auscultation.  HEART:  Normal sinus rhythm.  No cardiomegaly.  No murmurs.  ABDOMEN:  Negative.  EXTREMITIES:  Negative.   ADMISSION DIAGNOSES:  1. Senile nuclear cataract, left eye.  2. Pseudophakia, right eye.   SURGICAL PLAN:  Cataract/implant surgery, left eye.                                                Guadelupe Sabin, M.D.    HNJ/MEDQ  D:  10/30/2003  T:  10/30/2003  Job:  [id number]   cc:   Valetta Mole. Swords, M.D. [facility]   Maisie Fus D. [name], M.D. [facility]

## 2010-07-25 NOTE — Discharge Summary (Signed)
NAME:  Craig, [name]  [id number]   MEDICAL RECORD NO.:  [id number]          PATIENT TYPE:  OIB   LOCATION:  2855                         FACILITY:  [facility]   PHYSICIAN:  Maple Mirza, P.A. DATE OF BIRTH:  03/14/1929   DATE OF ADMISSION:  04/14/2005  DATE OF DISCHARGE:  04/14/2005                                 DISCHARGE SUMMARY   CARDIOLOGIST:  Maisie Fus D. [name], M.D. [facility].   PRIMARY CAREGIVER:  Valetta Mole. Swords, M.D. [facility].   This gentleman has no known drug allergies.   PRINCIPAL DIAGNOSES:  1.  Sick sinus syndrome, status post St. Jude Trilogy DR Plus pacemaker.  2.  Pacemaker at elective replacement indicator.  3.  Patient discharging today after generator change with implantation of      St. Jude Victory XL DR DDDR permanent pacemaker generator.  Patient      discharged the same day with no complications noted.   SECONDARY DIAGNOSES:  1.  Paroxysmal atrial fibrillation, on amiodarone and Coumadin.  2.  Dyslipidemia.  3.  Hypertension.  4.  Mild left ventricular dysfunction, ejection fraction 45-50%.  5.  Nonobstructive coronary artery disease.   PROCEDURE:  April 14, 2005, explantation of existing pacemaker and  implantation of St. Jude Alecia Lemming DR DDDR permanent pacemaker by Dr. Charlies Constable.   BRIEF HISTORY:  [name] is an active 76 year old male who has had a  St. Jude pacemaker implanted a number of years ago for sick sinus syndrome.  He also has paroxysmal atrial fibrillation, and when it is prolonged as it  was in October 2006 he was hospitalized with mild congestive heart failure.  He was started on amiodarone at that time which has greatly improved his  symptoms.  He is on chronic Coumadin for his atrial fibrillation.  The  patient visited the office April 08, 2005.  The pacemaker was  interrogated, and it demonstrates that it has reached elective replacement  indicator.  The patient will return electively for pacemaker  generator  changeout.   HOSPITAL COURSE:  The patient presented electively April 14, 2005, with  explantation of his St. Architect, implantation of St. Jude  Victory device.  The patient has had no postprocedural complications.   Discharges on the following medications:  1.  Proscar 5 mg daily.  2.  Multivitamin daily.  3.  Altace 5 mg daily.  4.  Crestor 20 mg at bedtime.  5.  Lopressor 25 mg b.i.d.  6.  Enteric coated aspirin 81 mg every other day.  7.  Amiodarone 200 mg daily.  8.  Voltaren 1 tablet every other day.  9.  Coumadin 2.5 mg daily, except for 5 mg on Saturday.   The patient will follow up at [facility].  He will see the pacer  clinic Wednesday April 29, 2005 at 9:00.  He will see Dr. [name] Tuesday  June 02, 2005 at 9:15.  He will see Dr. Juanda Chance Thursday June 04, 2005 at  4:15.  He has been given a prescription for Keflex 500 mg 1 tab q.6 h. for  the next  5 days, an antibiotic.  He is asked to keep his incision dry for  the next 7 days, to sponge bathe until Tuesday April 21, 2005.  He is  asked not to lift anything heavier than 10 pounds for the next week, and not  to drive for the next 2 days.   DISCHARGE DIET:  Low sodium, low cholesterol diet.   He is also asked to remove his bandage on the morning of April 15, 2005  and leave the incision open to air.      Maple Mirza, P.A.     GM/MEDQ  D:  04/14/2005  T:  04/15/2005  Job:  [id number]   cc:   [name]. [name], M.D. [facility]  [address]  [address]  [place]  [place] [address]   Valetta Mole. Swords, M.D. [facility]  [address] [place]  [place] [address]

## 2010-07-25 NOTE — H&P (Signed)
NAME:  [name], [name]  [id number]   MEDICAL RECORD NO.:  [id number]          PATIENT TYPE:  OBV   LOCATION:  1610                         FACILITY:  [facility]   PHYSICIAN:  Iva Boop, M.D. LHCDATE OF BIRTH:  04-Jun-1929   DATE OF ADMISSION:  01/12/2005  DATE OF DISCHARGE:                                HISTORY & PHYSICAL   CHIEF COMPLAINT:  Passing blood.   HISTORY:  [name] is a 77 year old white man on Coumadin for atrial  fibrillation. He underwent a cardioversion on December 31, 2004, due to  symptomatic atrial fibrillation. His INR was actually 3.9 at one point  during that hospitalization and then fell to 3.2 at discharge on October 26.  His hemoglobin was normal at that time, I believe. Subsequent to that, about  5 days ago he developed passage of blood and dark stools which sound like  melena. It has been painless. He is mildly sore in the left lower quadrant  at this point and rectum, he says. He has been without chest pain or  shortness of breath or dizziness or orthostasis. He went to the cardiology  clinic on November 3 - 3 days ago - and had a normal hemoglobin in the 13  range and his PT/INR was 2.8. He has been holding his Coumadin since the  bleeding started 5 days ago.   DRUG ALLERGIES:  ANTIHISTAMINES.   MEDICATIONS:  1.  Lopressor 25 mg twice a day.  2.  Amiodarone 400 mg twice a day (recently started).  3.  Crestor 20 mg daily.  4.  Aspirin 81 mg two each day.  5.  Altace 5 mg daily.  6.  Prilosec OTC one each day.  7.  Multivitamin daily.  8.  Voltaren 100 mg daily.  9.  Proscar 5 mg daily.   PAST MEDICAL HISTORY:  1.  Atrial fibrillation with sick sinus syndrome status post pacemaker.  2.  Coronary artery disease with catheterization in 2001 showing ejection      fraction 41%.  3.  Chronically elevated liver function tests.  4.  Esophageal dilation, 2000.  5.  Colonoscopy by me, 2003, demonstrating diverticulosis in  the left colon      and external hemorrhoids.  6.  Benign prostatic hypertrophy.  7.  Umbilical hernia repair.  8.  Left cataract surgery.   SOCIAL HISTORY:  Lives in [place] with his wife. She has been sick. 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 Two sons, one  daughter, five-grandchildren. He has not smoked since 1985, no alcohol since  1998. He is an active golfer usually.   FAMILY HISTORY:  Mother died at age 52 with an abdominal aortic aneurysm,  history of TB. Father at age 33 with sudden death. I do not believe there is  any colon cancer.   REVIEW OF SYSTEMS:  Eyeglasses. He had a right eye hemorrhage on Wednesday  prior to his admission in October which apparently happens a couple of times  a year. He denies any easy bruising. Review of systems otherwise appears  negative except for those  things mentioned above.   PHYSICAL EXAMINATION:  GENERAL:  Reveals a well-developed, well-nourished-  appearing elderly white man, looking a little younger than stated age. Color  is good.  VITAL SIGNS:  Blood pressure 110/68, pulse 70 and regular, weight 172  pounds.  EYES:  Anicteric.  LUNGS:  Clear.  HEART:  S1, S2. No rubs, murmurs, or gallops are heard. He sounds like he is  in a normal sinus rhythm.  ABDOMEN:  Soft, nontender. Bowel sounds are present. There is no  organomegaly or mass.  RECTAL:  Shows maroon and dark stool, no melena but clearly maroon. There is  no mass.  EXTREMITIES:  Show no peripheral edema.  NEUROLOGIC:  He is alert and oriented x3.  NECK:  There is no cervical adenopathy appreciated.  SKIN:  Without significant ecchymoses or rash.   ASSESSMENT:  Low-grade gastrointestinal bleeding, likely colonic though the  possibility of an upper gastrointestinal bleed does exist. I would have  expected his hemoglobin to be down since he was having maroon stools, if he  were having an upper GI bleed. He is not only on Coumadin with  recent over-  anticoagulation, but he is also on Voltaren and aspirin which increases risk  of GI bleeding. Fortunately, he appears stable and not acutely ill.   PLAN:  Will admit to the hospital for observation admission, checking STAT  CBC, protime, and BMET. Will plan for a colonoscopy plus or minus EGD in the  next day depending upon his INR results. I have explained the risks,  benefits, and indications of this approach and he understands and agrees to  proceed. Most likely he is bleeding from diverticulosis, but that is a guess  at this point and I think we need to clarify things further. May need  cardiology input regarding anticoagulation. Need to consider discontinuing  his Voltaren and/or aspirin as well.   Note, the patient's recent initiation of amiodarone may have had some sort  of effect on his Coumadin as well and this may need to be sorted out with  respect to dosing.      Iva Boop, M.D. [facility]  Electronically Signed     CEG/MEDQ  D:  01/12/2005  T:  01/12/2005  Job:  [id number]   cc:   [name]. [name], M.D. [facility]  [address]  [address]  [place]  [place] [address]   [name]. [name], M.D. [facility]  [address] [place]  [place] [address]

## 2010-07-25 NOTE — Assessment & Plan Note (Signed)
[facility]                            CARDIOLOGY OFFICE NOTE   CARLTON, BUSKEY                     MRN:          [id number]  DATE:01/27/2006                            DOB:          07/02/1929    Mr. Ozment is actually in for followup.  He generally is feeling  quite well.  He is feeling actually a lot better.  He underwent an  echocardiographic study today which shows improved overall LV function.  He denies any chest pain.   Medications include:  1. Multivitamin daily.  2. Coumadin as directed.  3. Voltaren one daily.  4. Atacand 8 mg daily.  5. Finasteride 5 mg daily.  6. Digoxin 0.25 mg daily.  7. Crestor 10 daily.  8. Coreg 6.25 b.i.d.   On physical, the blood pressure is 98/52, the pulse is 69, the lung  fields are clear, and the cardiac rhythm is regular.  There is not a  definite murmur.   The echocardiographic study reveals overall better ejection fraction  than on previous studies.  There is mild MR.   EKG reveals atrial pacing and ventricular tracking.   IMPRESSION:  1. Recurrent atrial fibrillation, now in sinus rhythm.  2. Liver function abnormalities now on amiodarone.  3. Nonischemic cardiomyopathy.  4. Thromboembolic risk with atrial fibrillation, therefore on      Coumadin.  5. Hypercholesterolemia.  6. Mild coronary artery disease.   PLAN:  Continue current medical regimen.     [name]. [name], MD, [facility]  Electronically Signed    TDS/MedQ  DD: 04/19/2006  DT: 04/19/2006  Job #: [id number]

## 2010-07-25 NOTE — Assessment & Plan Note (Signed)
[facility]   NAME:  [name], [name]            ACCOUNT NO.:  [id number]   MEDICAL RECORD NO.:  [id number]      DATE OF BIRTH:  09-24-1929   PHYSICIAN:  Madolyn Frieze. Jens Som, MD, [facility]  VISIT DATE:                                   OFFICE VISIT   [name] is a 77 year old male who has developed recurrent atrial  fibrillation.  This is cardioversion of atrial fibrillation following  transesophageal echocardiogram, which showed no left antral appendage  thrombus.  The patient was sedated with Pentothal 175 mg intravenously.  Synchronized cardioversion with 120 joules (biphasic) resulted in an  atrial paced rhythm.  There were no immediate complications.  We  recommended continuing Coumadin with followup in the Coumadin clinic and  Dr. [name].      Madolyn Frieze Jens Som, MD, [facility]  Electronically Signed     BSC/MEDQ  D:  04/26/2006  T:  04/27/2006  Job:  [id number]

## 2010-07-25 NOTE — Op Note (Signed)
NAMEALVARO, [name]  [id number]   MEDICAL RECORD NO.:  [id number]          PATIENT TYPE:  INP   LOCATION:  2018                         FACILITY:  [facility]   PHYSICIAN:  [name]. Ladona Ridgel, MD    DATE OF BIRTH:  [birth year]   DATE OF PROCEDURE:  06/11/2006  DATE OF DISCHARGE:                               OPERATIVE REPORT   PROCEDURE PERFORMED:  DC cardioversion.   INDICATION:  Symptomatic atrial fibrillation.   1. INTRODUCTION:  The patient is a 77 year old man, admitted to the      hospital with atrial fibrillation, low lung Tikosyn therapy.  After      2 days of Tikosyn, he did not terminate atrial fibrillation, and he      is now referred for DC cardioversion.   II:  PROCEDURE:  After informed consent was obtained, the patient was  prepped in the usual manner.  He was given 200 joules of synchronized  energy through the electrodispersive pad after fentanyl and Versed were  given for sedation, and this restored sinus rhythm.  There were no  immediate procedure complications.  He was allowed to awaken on his own  and without any difficulty.   III:  COMPLICATIONS:  There were no immediate procedure complications.   IV:  RESULTS:  This demonstrates successful DC cardioversion in patient  with symptomatic atrial fibrillation.      [name]. Ladona Ridgel, MD  Electronically Signed     GWT/MEDQ  D:  06/11/2006  T:  06/11/2006  Job:  [id number]   cc:   [name]. [name], MD, [facility]  [address]  [address]  [place]  [place] [address]   Valetta Mole. Swords, MD  [address] [place]  [place] [address]

## 2010-07-25 NOTE — H&P (Signed)
NAME:  [name], [name]  [id number]   MEDICAL RECORD NO.:  [id number]                   PATIENT TYPE:  INP   LOCATION:                                       FACILITY:  [facility]   PHYSICIAN:  Salvadore Farber, M.D.             DATE OF BIRTH:  02-18-1930   DATE OF ADMISSION:  04/11/2003  DATE OF DISCHARGE:                                HISTORY & PHYSICAL   REASON FOR ADMISSION:  [name] is a delightful 77 year old male, a  patient of [name]. [name], M.D., with a history of paroxysmal atrial  fibrillation and nonischemic cardiomyopathy, on chronic Coumadin, who  presents to the office today for evaluation of recurrent tachypalpitations.  Electrocardiogram reveals atrial fibrillation at 140 bpm.  The patient is  now admitted for rate control and close monitoring of chronically low blood  pressure.   The patient states that he had been doing extremely well since his last  cardioversion in the fall of 2004.  He developed some palpable decrease in  his energy level and some mild dyspnea associated with moderate activity  since Sunday.  He also has noted palpitations.  He denies any chest pain.  Although the patient completed his routine exercise program yesterday  morning, he declined to do so today given his diminished energy.  However,  he feels better today than he has in the last few days.  He knew that he was  in atrial fibrillation due to his irregular pulse and feels that he had been  maintaining NSR since his last cardioversion.  A review of his recent INR  levels shows adequate anticoagulation.   Electrocardiogram in the office reveals atrial fibrillation at 141 bpm with  left axis deviation and no acute changes.   ALLERGIES:  ANTIHISTAMINES.   CURRENT MEDICATIONS:  1. Volteran 100 mg daily.  2. Proscar 5 mg daily.  3. Coumadin as directed.  4. Protonix 40 mg daily.  5. Altace 5 mg b.i.d.  6. Lopressor 25 mg b.i.d.  7. Aspirin 81  mg daily.  8. Lipitor 40 mg daily.   PAST MEDICAL HISTORY:  1. Nonischemic cardiomyopathy.     a. Noncritical coronary artery disease with 70% obtuse marginal on        coronary angiogram in 2001.     b. EF of 41%.  2. Paroxysmal atrial fibrillation/sinus node dysfunction.     a. Status post dual-chamber Pacesetter Trilogy pacemaker in July of 1998.     b. Status post multiple DCCVs.     c. Chronic Coumadin.  3. Dyslipidemia.  4. History of mildly elevated liver enzymes.  5. History of esophageal stricture.     a. Status post dilatation in 2000.  6. Status post umbilical hernia repair.  7. BPH.   REVIEW OF SYSTEMS:  Denies any recent evidence of hemoptysis, hematuria, or  hematochezia.  Denies orthopnea, PND, or pedal edema.  Otherwise  as per HPI.  Remaining systems negative.   SOCIAL HISTORY:  Denies alcohol or tobacco use.   FAMILY HISTORY:  Father deceased at age 40 secondary to MI.   PHYSICAL EXAMINATION:  VITAL SIGNS:  Blood pressure 90/50, pulse 141,  irregular rate at 181.  GENERAL APPEARANCE:  A 77 year old male in no apparent distress.  HEENT:  Normocephalic and atraumatic.  NECK:  Preserved bilateral carotid pulses without bruits.  No jugular venous  distention.  LUNGS:  Clear to auscultation in all fields.  HEART:  Irregular rhythm with increased rate.  No significant murmurs.  No  S3.  ABDOMEN:  Soft and nontender.  EXTREMITIES:  Preserved distal pulses with no pedal edema.  NEUROLOGIC:  No focal deficit.   IMPRESSION:  1. Recurrent paroxysmal atrial fibrillation with rapid ventricular response.     a. Status post multiple direct current cardioversions, last in June of        2004.     b. Chronic Coumadin.     c. Status post dual-chamber Pacesetter pacemaker implantation in 1998.  2. Chronic hypotension.  3. Nonischemic cardiomyopathy.     a. 70% obtuse marginal by coronary angiogram in 2001.     b. Ejection fraction 45-50% by echocardiogram in 2002.      c. Nonischemic Cardiolite in June of 2004.  4. Dyslipidemia.   PLAN:  The patient will be admitted to [facility] for management of  rapid ventricular response in the setting of chronic hypotension.  Although  his associated symptoms are mild, his low pressure makes it difficult to  manage his rapid ventricular response as an outpatient.  We will therefore  admit him for close monitoring of blood pressure while we increase his  Lopressor from 25 mg b.i.d. to t.i.d. dosing.  His blood pressure will be  monitored closely and he will continue on current dose of Coumadin.  The  patient was seen and examined in conjunction with Salvadore Farber, M.D.      [name], P.A. [facility]                      Salvadore Farber, M.D.    GS/MEDQ  D:  04/11/2003  T:  04/11/2003  Job:  [id number]   cc:   [name], M.D. [facility]

## 2010-08-01 ENCOUNTER — Telehealth: Payer: Self-pay | Admitting: Cardiology

## 2010-08-01 NOTE — Telephone Encounter (Signed)
Per pt calling C/O in Afib now.

## 2010-08-01 NOTE — Telephone Encounter (Signed)
I spoke with the pt and he said he has been going in and out of AFIB for the past few days.  The pt said he just became symptomatic late yesterday afternoon.  The pt complains of SOB which is worse with exertion and fatigue.  BP 118/72, pulse 78.  The pt would like to get scheduled for an OV or DCCV at the beginning of next week. I will speak with Dr [name] about this patient.

## 2010-08-01 NOTE — Telephone Encounter (Signed)
I spoke with Dr [name] and he would like the pt to come into the office on Tuesday for an EKG to assess rhythm.  The pt's last INR 07/16/10 was 1.9. The pt will also needs his INR checked on 08/05/10. The pt will proceed to the ER if he develops any problems over the weekend. Pt aware of appointments and agreed with plan.

## 2010-08-04 ENCOUNTER — Other Ambulatory Visit: Payer: Self-pay | Admitting: *Deleted

## 2010-08-04 MED ORDER — DOFETILIDE 500 MCG PO CAPS: 500.0000 ug | ORAL_CAPSULE | Freq: Two times a day (BID) | ORAL | Status: DC

## 2010-08-05 ENCOUNTER — Ambulatory Visit (INDEPENDENT_AMBULATORY_CARE_PROVIDER_SITE_OTHER): Payer: Medicare Other | Admitting: *Deleted

## 2010-08-05 ENCOUNTER — Ambulatory Visit (INDEPENDENT_AMBULATORY_CARE_PROVIDER_SITE_OTHER): Payer: Medicare Other | Admitting: Cardiology

## 2010-08-05 ENCOUNTER — Encounter: Payer: Self-pay | Admitting: *Deleted

## 2010-08-05 ENCOUNTER — Encounter: Payer: Self-pay | Admitting: Cardiology

## 2010-08-05 ENCOUNTER — Ambulatory Visit (INDEPENDENT_AMBULATORY_CARE_PROVIDER_SITE_OTHER): Payer: Medicare Other

## 2010-08-05 VITALS — BP 104/69 | HR 73 | Resp 16 | Ht 67.0 in | Wt 170.0 lb

## 2010-08-05 DIAGNOSIS — I2510 Atherosclerotic heart disease of native coronary artery without angina pectoris: Secondary | ICD-10-CM

## 2010-08-05 DIAGNOSIS — I4891 Unspecified atrial fibrillation: Secondary | ICD-10-CM

## 2010-08-05 DIAGNOSIS — I495 Sick sinus syndrome: Secondary | ICD-10-CM

## 2010-08-05 NOTE — Assessment & Plan Note (Signed)
I had his device interrogated and indeed has been in fibrillation for about 9 days continuously. Therefore, he needs cardioversion. Unfortunately, though his INR today was 2.0, 3 weeks ago it was 1.9. Given the fact that we don't know why and it reached a therapeutic level he will need TEE guided cardioversion. He used to symptomatic to wait for another 3 or 4 weeks for another therapeutic INR.  He should be referred to follow up with Dr. Johney Frame to discuss possible ablation.

## 2010-08-05 NOTE — Assessment & Plan Note (Signed)
Pacemaker interrogation was otherwise unremarkable.

## 2010-08-05 NOTE — Patient Instructions (Signed)
You have been schedule for a TEE guided cardioversion Please continue your current medications as listed Follow up will be made after cardioversion

## 2010-08-05 NOTE — Progress Notes (Signed)
HPI The patient is to my schedule today to evaluate atrial fibrillation. He has had a long history of this and he reports multiple cardioversions.  He has been on Tikosyn and has required cardioversion on this as well.  He presents today because he feels like he has been out of rhythm for several days. When he last saw Dr. Ladona Ridgel he was having paroxysms standing 10-20% of the time in fibrillation. However, no meds were adjusted. He has discussed possible ablation with Dr. [name] but he did not discuss this with Dr. Ladona Ridgel.  When he is in fibrillation he feels that he in my head. It feels somewhat short of breath. He is not have chronic syncope. He has not had chest pressure, neck or arm discomfort. He is in no weight gain or edema. He has had no fevers or chills.  Allergies  Allergen Reactions  . Antihistamines, Diphenhydramine-Type     Current Outpatient Prescriptions  Medication Sig Dispense Refill  . carvedilol (COREG) 12.5 MG tablet Take 1 tablet (12.5 mg total) by mouth 2 (two) times daily.  60 tablet  0  . digoxin (LANOXIN) 0.25 MG tablet Take 1 tablet (250 mcg total) by mouth daily.  90 tablet  0  . dofetilide (TIKOSYN) 500 MCG capsule Take 1 capsule (500 mcg total) by mouth 2 (two) times daily.  180 capsule  3  . finasteride (PROSCAR) 5 MG tablet Take 5 mg by mouth every other day.        . Magnesium Oxide 400 (241.3 MG) MG TABS Take 1 tablet by mouth daily.        . Multiple Vitamins-Minerals (MULTIVITAMIN WITH MINERALS) tablet Take 1 tablet by mouth daily.        . pantoprazole (PROTONIX) 40 MG tablet Take 40 mg by mouth every other day.        . pravastatin (PRAVACHOL) 80 MG tablet Take 80 mg by mouth daily.        Marland Kitchen warfarin (COUMADIN) 5 MG tablet Take 1 tablet (5 mg total) by mouth as directed.  90 tablet  0    Past Medical History  Diagnosis Date  . Coronary artery disease   . Cardiomyopathy primary-nonischemic EF 45%  . Hyperlipidemia   . GERD (gastroesophageal reflux  disease)   . Polymyalgia rheumatica   . Sick sinus syndrome   . Atrial fibrillation   . Dysphagia   . Hearing loss, mixed, bilateral   . Increased prostate specific antigen (PSA) velocity   . Lumbar back pain   . BPH (benign prostatic hypertrophy)   . Breast mass     Past Surgical History  Procedure Date  . Cataract extraction     left  . Umbilical hernia repair   . Insert / replace / remove pacemaker     ROS:  As stated in the HPI and negative for all other systems.  PHYSICAL EXAM BP 104/69  Pulse 73  Resp 16  Ht 5\' 7"  (1.702 m)  Wt 170 lb (77.111 kg)  BMI 26.63 kg/m2 GENERAL:  Well appearing HEENT:  Pupils equal round and reactive, fundi not visualized, oral mucosa unremarkable NECK:  No jugular venous distention, waveform within normal limits, carotid upstroke brisk and symmetric, no bruits, no thyromegaly LYMPHATICS:  No cervical, inguinal adenopathy LUNGS:  Clear to auscultation bilaterally BACK:  No CVA tenderness CHEST:  Well healed right upper pacemaker pocket. HEART:  PMI not displaced or sustained,S1 and S2 within normal limits, no S3, no S4, no  clicks, no rubs, no murmurs ABD:  Flat, positive bowel sounds normal in frequency in pitch, no bruits, no rebound, no guarding, no midline pulsatile mass, no hepatomegaly, no splenomegaly EXT:  2 plus pulses throughout, no edema, no cyanosis no clubbing SKIN:  No rashes no nodules NEURO:  Cranial nerves II through XII grossly intact, motor grossly intact throughout PSYCH:  Cognitively intact, oriented to person place and time   EKG:  Atrial fibrillation with ventricular pacing  ASSESSMENT AND PLAN

## 2010-08-05 NOTE — Assessment & Plan Note (Signed)
He has had nonobstructive CAD.  No further evaluation is indicated.  No change in therapy is indicated.

## 2010-08-05 NOTE — Progress Notes (Signed)
Pacer check by industry 

## 2010-08-06 ENCOUNTER — Telehealth: Payer: Self-pay | Admitting: Cardiology

## 2010-08-06 ENCOUNTER — Encounter: Payer: Medicare Other | Admitting: *Deleted

## 2010-08-06 NOTE — Telephone Encounter (Signed)
Mick Sell re another pt scheduled on paper for tee 5-31 tomorrow but not on their schedule

## 2010-08-06 NOTE — Telephone Encounter (Signed)
[name] from endo spoke with [place]. [name] is trying  to arrange  this issue. She will call us back if any questions.

## 2010-08-07 ENCOUNTER — Ambulatory Visit (HOSPITAL_COMMUNITY)
Admission: RE | Admit: 2010-08-07 | Discharge: 2010-08-07 | Disposition: A | Discharge status: Home / Self Care | Payer: Medicare Other | Source: Ambulatory Visit | Attending: Cardiology | Admitting: Cardiology

## 2010-08-07 DIAGNOSIS — I4891 Unspecified atrial fibrillation: Secondary | ICD-10-CM | POA: Insufficient documentation

## 2010-08-07 DIAGNOSIS — I4892 Unspecified atrial flutter: Secondary | ICD-10-CM | POA: Insufficient documentation

## 2010-08-07 LAB — BASIC METABOLIC PANEL
CO2: 26 mEq/L (ref 19–32)
Chloride: 103 mEq/L (ref 96–112)
Creatinine, Ser: 0.77 mg/dL (ref 0.4–1.5)
GFR calc Af Amer: >60 mL/min (ref >60)
Sodium: 137 mEq/L (ref 135–145)

## 2010-08-07 LAB — CBC
HCT: 41.9 % (ref 39.0–52.0)
MCV: 84.1 fL (ref 78.0–100.0)
RDW: 13.3 % (ref 11.5–15.5)
WBC: 8.9 10*3/uL (ref 4.0–10.5)

## 2010-08-08 NOTE — Assessment & Plan Note (Signed)
[facility]  NAME:  [name], [name]            ACCOUNT NO.:  [id number]  MEDICAL RECORD NO.:  [id number]      DATE OF BIRTH:  May 30, 1929  PHYSICIAN:  Madolyn Frieze. Jens Som, MD, [facility] VISIT DATE:  08/07/2010                                  OFFICE VISIT   This is cardioversion of atrial fibrillation.  A transesophageal echocardiogram prior to the procedure showed no left atrial appendage thrombus.  The patient was sedated by Anesthesia with Diprivan 50 mg intravenously.  Synchronized cardioversion with 120 joules resulted in an AV paced rhythm.  There were no immediate complications.  We would recommend continuing Coumadin.     Madolyn Frieze Jens Som, MD, [facility]     BSC/MEDQ  D:  08/07/2010  T:  08/08/2010  Job:  [id number]

## 2010-08-11 ENCOUNTER — Telehealth: Payer: Self-pay | Admitting: Cardiology

## 2010-08-11 NOTE — Telephone Encounter (Signed)
Appointment on 08/13/10 was an old appt, pt came in to see Hochrein on 08/05/10 and we check INR while pt in office.  Correct appt in EMR is on 09/02/10 for follow-up.

## 2010-08-11 NOTE — Telephone Encounter (Signed)
Pt has an appt on the 6th and the 26th of June and he needed to know if he needed both appt's

## 2010-08-13 ENCOUNTER — Encounter: Payer: Medicare Other | Admitting: *Deleted

## 2010-09-02 ENCOUNTER — Ambulatory Visit (INDEPENDENT_AMBULATORY_CARE_PROVIDER_SITE_OTHER): Payer: Medicare Other | Admitting: *Deleted

## 2010-09-02 DIAGNOSIS — I4891 Unspecified atrial fibrillation: Secondary | ICD-10-CM

## 2010-09-08 LAB — POCT INR: INR: 1.9

## 2010-09-09 ENCOUNTER — Ambulatory Visit (INDEPENDENT_AMBULATORY_CARE_PROVIDER_SITE_OTHER): Payer: Self-pay | Admitting: Internal Medicine

## 2010-09-09 DIAGNOSIS — R0989 Other specified symptoms and signs involving the circulatory and respiratory systems: Secondary | ICD-10-CM

## 2010-09-17 ENCOUNTER — Telehealth: Payer: Self-pay | Admitting: Internal Medicine

## 2010-09-17 MED ORDER — FINASTERIDE 5 MG PO TABS: 5.0000 mg | ORAL_TABLET | ORAL | Status: DC

## 2010-09-17 MED ORDER — MAGNESIUM OXIDE 400 (241.3 MG) MG PO TABS: 1.0000 | ORAL_TABLET | Freq: Every day | ORAL | Status: DC

## 2010-09-17 NOTE — Telephone Encounter (Signed)
rx sent in electronically 

## 2010-09-17 NOTE — Telephone Encounter (Signed)
Need new rxs for magnesium oxide and fenesteride. Please send to [facility]

## 2010-09-19 ENCOUNTER — Ambulatory Visit (INDEPENDENT_AMBULATORY_CARE_PROVIDER_SITE_OTHER): Payer: Medicare Other | Admitting: Cardiology

## 2010-09-19 ENCOUNTER — Encounter: Payer: Self-pay | Admitting: Cardiology

## 2010-09-19 VITALS — BP 99/66 | HR 69 | Resp 18 | Ht 67.0 in | Wt 163.8 lb

## 2010-09-19 DIAGNOSIS — I2510 Atherosclerotic heart disease of native coronary artery without angina pectoris: Secondary | ICD-10-CM

## 2010-09-19 DIAGNOSIS — I4891 Unspecified atrial fibrillation: Secondary | ICD-10-CM

## 2010-09-19 DIAGNOSIS — I428 Other cardiomyopathies: Secondary | ICD-10-CM

## 2010-09-19 DIAGNOSIS — E785 Hyperlipidemia, unspecified: Secondary | ICD-10-CM

## 2010-09-19 NOTE — Patient Instructions (Signed)
Your physician recommends that you schedule a follow-up appointment in: 3 months with Dr Wall  

## 2010-09-19 NOTE — Progress Notes (Signed)
HPI:  He is doing very well.  He just went through a repeat cardioversion and is staying in NSR.  He denies chest pain or shortness of breath.  Functional status is good.   Current Outpatient Prescriptions  Medication Sig Dispense Refill  . carvedilol (COREG) 12.5 MG tablet Take 1 tablet (12.5 mg total) by mouth 2 (two) times daily.  60 tablet  0  . digoxin (LANOXIN) 0.25 MG tablet Take 1 tablet (250 mcg total) by mouth daily.  90 tablet  0  . dofetilide (TIKOSYN) 500 MCG capsule Take 1 capsule (500 mcg total) by mouth 2 (two) times daily.  180 capsule  3  . finasteride (PROSCAR) 5 MG tablet Take 5 mg by mouth daily.        . Magnesium Oxide 400 (241.3 MG) MG TABS Take 1 tablet (241.3 mg total) by mouth daily.  90 tablet  3  . Multiple Vitamins-Minerals (MULTIVITAMIN WITH MINERALS) tablet Take 1 tablet by mouth daily.        . pantoprazole (PROTONIX) 40 MG tablet Take 40 mg by mouth daily.       . pravastatin (PRAVACHOL) 80 MG tablet Take 80 mg by mouth daily.        Marland Kitchen warfarin (COUMADIN) 5 MG tablet Take 1 tablet (5 mg total) by mouth as directed.  90 tablet  0    Allergies  Allergen Reactions  . Antihistamines, Diphenhydramine-Type     Past Medical History  Diagnosis Date  . Coronary artery disease   . Cardiomyopathy primary-nonischemic EF 45%  . Hyperlipidemia   . GERD (gastroesophageal reflux disease)   . Polymyalgia rheumatica   . Sick sinus syndrome   . Atrial fibrillation   . Dysphagia   . Hearing loss, mixed, bilateral   . Increased prostate specific antigen (PSA) velocity   . Lumbar back pain   . BPH (benign prostatic hypertrophy)   . Breast mass     Past Surgical History  Procedure Date  . Cataract extraction     left  . Umbilical hernia repair   . Insert / replace / remove pacemaker     Family History  Problem Relation Age of Onset  . Heart attack Father 36    deceased  . Aneurysm Mother     deceased AAA  . Hypertension Mother   . Other Brother    deceased at birth    History   Social History  . Marital Status: Married    Spouse Name: N/A    Number of Children: N/A  . Years of Education: N/A   Occupational History  . Not on file.   Social History Main Topics  . Smoking status: Former Games developer  . Smokeless tobacco: Not on file  . Alcohol Use: Yes  . Drug Use: No  . Sexually Active: Not on file   Other Topics Concern  . Not on file   Social History Narrative  . No narrative on file    ROS: Please see the HPI.  All other systems reviewed and negative.  PHYSICAL EXAM:  BP 99/66  Pulse 69  Resp 18  Ht 5\' 7"  (1.702 m)  Wt 163 lb 12.8 oz (74.299 kg)  BMI 25.65 kg/m2  General: Well developed, well nourished, in no acute distress. Head:  Normocephalic and atraumatic. Neck: no JVD Lungs: Clear to auscultation and percussion. Heart: Normal S1 and S2.  No murmur, rubs or gallops.  Abdomen:  Normal bowel sounds; soft; non tender; no organomegaly  Pulses: Pulses normal in all 4 extremities. Extremities: No clubbing or cyanosis. No edema. Neurologic: Alert and oriented x 3.  EKG:  Atrial pace.  Left axis deviation.  Delayed R wave progression secondary to LAD. ASSESSMENT AND PLAN:

## 2010-09-20 DIAGNOSIS — I428 Other cardiomyopathies: Secondary | ICD-10-CM | POA: Insufficient documentation

## 2010-09-20 NOTE — Assessment & Plan Note (Signed)
Class I at present.  Exercises regularly.

## 2010-09-20 NOTE — Assessment & Plan Note (Signed)
LV gets worse with af, and better with NSR.  Maintaining rhythm is important for him.

## 2010-09-20 NOTE — Assessment & Plan Note (Signed)
Currently NSR 

## 2010-09-20 NOTE — Assessment & Plan Note (Signed)
LDL 82mg /dl on pravastatin.

## 2010-09-23 ENCOUNTER — Encounter (INDEPENDENT_AMBULATORY_CARE_PROVIDER_SITE_OTHER): Payer: Medicare Other | Admitting: *Deleted

## 2010-09-23 DIAGNOSIS — R0989 Other specified symptoms and signs involving the circulatory and respiratory systems: Secondary | ICD-10-CM

## 2010-09-23 DIAGNOSIS — I4891 Unspecified atrial fibrillation: Secondary | ICD-10-CM

## 2010-09-27 ENCOUNTER — Emergency Department (HOSPITAL_COMMUNITY): Payer: Medicare Other

## 2010-09-27 ENCOUNTER — Inpatient Hospital Stay (HOSPITAL_COMMUNITY)
Admission: EM | Admit: 2010-09-27 | Discharge: 2010-09-28 | DRG: 394 | Disposition: A | Discharge status: Home / Self Care | Payer: Medicare Other | Attending: Surgery | Admitting: Surgery

## 2010-09-27 DIAGNOSIS — Z7901 Long term (current) use of anticoagulants: Secondary | ICD-10-CM

## 2010-09-27 DIAGNOSIS — Z950 Presence of cardiac pacemaker: Secondary | ICD-10-CM

## 2010-09-27 DIAGNOSIS — K4030 Unilateral inguinal hernia, with obstruction, without gangrene, not specified as recurrent: Principal | ICD-10-CM | POA: Diagnosis present

## 2010-09-27 DIAGNOSIS — I428 Other cardiomyopathies: Secondary | ICD-10-CM | POA: Diagnosis present

## 2010-09-27 DIAGNOSIS — Z01812 Encounter for preprocedural laboratory examination: Secondary | ICD-10-CM

## 2010-09-27 DIAGNOSIS — Z01810 Encounter for preprocedural cardiovascular examination: Secondary | ICD-10-CM

## 2010-09-27 DIAGNOSIS — I4891 Unspecified atrial fibrillation: Secondary | ICD-10-CM | POA: Diagnosis present

## 2010-09-27 DIAGNOSIS — I509 Heart failure, unspecified: Secondary | ICD-10-CM | POA: Diagnosis present

## 2010-09-27 LAB — BASIC METABOLIC PANEL
Chloride: 101 mEq/L (ref 96–112)
GFR calc Af Amer: >60 mL/min (ref >60)
Potassium: 4.3 mEq/L (ref 3.5–5.1)
Sodium: 134 mEq/L — ABNORMAL LOW (ref 135–145)

## 2010-09-27 LAB — CBC
Platelets: 225 10*3/uL (ref 150–400)
RDW: 13.2 % (ref 11.5–15.5)
WBC: 12.7 10*3/uL — ABNORMAL HIGH (ref 4.0–10.5)

## 2010-09-27 LAB — PROTIME-INR
INR: 1.6 — ABNORMAL HIGH (ref 0.00–1.49)
Prothrombin Time: 19.3 seconds — ABNORMAL HIGH (ref 11.6–15.2)

## 2010-09-28 LAB — PROTIME-INR: Prothrombin Time: 18 seconds — ABNORMAL HIGH (ref 11.6–15.2)

## 2010-09-28 LAB — BASIC METABOLIC PANEL
BUN: 13 mg/dL (ref 6–23)
Calcium: 8.5 mg/dL (ref 8.4–10.5)
Chloride: 103 mEq/L (ref 96–112)
Creatinine, Ser: 0.73 mg/dL (ref 0.50–1.35)
GFR calc Af Amer: >60 mL/min (ref >60)

## 2010-09-28 LAB — CBC
HCT: 40.2 % (ref 39.0–52.0)
Hemoglobin: 14.7 g/dL (ref 13.0–17.0)
WBC: 8.2 10*3/uL (ref 4.0–10.5)

## 2010-09-29 ENCOUNTER — Telehealth: Payer: Self-pay | Admitting: *Deleted

## 2010-09-29 NOTE — Telephone Encounter (Signed)
[facility] Triage Call Report Triage Record Num: [id number] Operator: April Finney Patient Name: [name] Call Date & Time: 09/27/2010 9:19:39AM Patient Phone: [phone] PCP: Valetta Mole. Swords Patient Gender: Male PCP Fax : [phone] Patient DOB: 10-Aug-1929 Practice Name: [facility] Reason for Call: Severe abdominal pain from his hernia Alson calling about new onset of hernia between umbilicus and pelvic area. Size of a softball and having severe pain. No difficulty with breathing or chest pain. No known injury. See in ED care advice given. Protocol(s) Used: Abdominal Pain Recommended Outcome per Protocol: See ED Immediately Reason for Outcome: Unbearable abdominal/pelvic pain Care Advice: ~ Allow the patient to be in a position of comfort. ~ Another adult should drive. ~ Pain medication or laxatives should not be taken until symptoms are evaluated. ~ Do not eat or drink anything until evaluated by provider. Call EMS 911 if signs and symptoms of shock develop (such as unable to stand due to faintness, dizziness, or lightheadedness; new onset of confusion; slow to respond or difficult to awaken; skin is pale, gray, cool, or moist to touch; severe weakness; loss of consciousness). ~ ~ IMMEDIATE ACTION Write down provider's name. List or place the following in a bag for transport with the patient: current prescription and/or nonprescription medications; alternative treatments, therapies and medications; and street drugs. ~ 09/27/2010 9:26:02AM Page 1 of 1 [id number]

## 2010-10-03 NOTE — Consult Note (Addendum)
[name], COOMES NO.:  [id number]  MEDICAL RECORD NO.:  [id number]  LOCATION:  3740                         FACILITY:  [facility]  PHYSICIAN:  Vesta Mixer, M.D. DATE OF BIRTH:  Feb 09, 1930  DATE OF CONSULTATION:  09/27/2010 DATE OF DISCHARGE:                                CONSULTATION   PRIMARY CARDIOLOGIST:  Craig Morton. [name], MD, [facility]  CHIEF COMPLAINT:  Abdominal pain.  REASON FOR CONSULTATION:  Preop clearance for inguinal hernia repair.  HISTORY OF PRESENT ILLNESS:  [name] is a 77 year old gentleman with a history of nonobstructive CAD by cath in 2001, nonischemic cardiomyopathy with an EF of 35%-40%, and PAF status post recent cardioversion on Aug 07, 2010, sick sinus syndrome status post pacemaker implantation, who was admitted with abdominal pain.  This morning at approximately 7 o'clock in the morning, he was pulling weeds and felt a dull discomfort in is right groin with a subsequent bulge that was noted.  He presented to the ER and was diagnosed with a right inguinal hernia.  The ED physician reduced it with improvement in his symptoms, but Surgery has seen him, feels that it warrants intervention non- emergently.  This is tentatively planned for tomorrow.  His INR is subtherapeutic, in the hospital 1.6.  He is atrial paced on EKG.  He has done particularly well up until this point since his cardioversion in May, denies any chest pain, shortness of breath, palpitations, or any episodes of the interim atrial fibrillation.  He reports compliance of all his medicines.  PAST MEDICAL HISTORY: 1. Atrial fibrillation.     a.     Status post TEE/DCCV on Aug 07, 2010, was noted      subtherapeutic INR today.     b.     Prior cardioversion in February 2012, as well as 2010. 2. Sick sinus syndrome status post [employer] pacemaker implantation in     2007. 3. Polymyalgia rheumatica. 4. BPH. 5. CAD by cath in 2001, with reported 60%-70% ostial  circumflex, 20%-     30% RCA spasm, and 40%-50% mid RCA. 6. Nonischemic cardiomyopathy with EF of 35%-40% by TEE in May 2012.     a.     Not currently on ACE inhibitor, but blood pressure last      office visit was 99/66. 7. History of elevated LFTs, with most recent check in March 2012.,     being normal. 8. Esophageal dilatation. 9. Cataract surgery. 10.Moderate MR by echo in May 2012. 11.Umbilical hernia repair.  MEDICATIONS: 1. Coreg 12.5 mg b.i.d. 2. Tikosyn 500 mcg p.o. q.12 h. 3. Digoxin 0.25 mg daily. 4. Mag oxide 400 mg daily. 5. Multivitamin. 6. Protonix 40 mg daily. 7. Pravastatin 80 mg at bedtime. 8. Coumadin.  The patient has had some subtherapeutic INRs ranging from 1.3-1.9, so we will likely need dose adjustment on discharge.  ALLERGIES:  ANTIHISTAMINE.  SOCIAL HISTORY:  The patient is married.  He is a former tobacco user. He drinks occasional alcohol.  FAMILY HISTORY:  Positive for AAA in his mother as well as hypertension. His father had a cardiac event at age 86.  REVIEW OF SYSTEMS:  No chest pain,  shortness of breath, dyspnea on exertion, palpitations, edema, orthopnea, lower extremity claudication, abdominal swelling, or weight gain.  All other systems reviewed and otherwise negative except for those noted in HPI.  No nausea or vomiting.  LABORATORY DATA:  WBC 12.7, hemoglobin 14.4, hematocrit 39.7, platelet count 225.  Sodium 134, potassium 4.3, chloride 101, CO2 of 25, glucose 115, BUN 17, creatinine 0.63, INR 1.60.  EKG; atrial paced with incomplete left bundle-branch block, poor R-wave progression, unchanged from Aug 07, 2010.  RADIOLOGIC STUDIES:  Chest x-ray, no active disease.  PHYSICAL EXAMINATION:  VITAL SIGNS:  Temperature 98.2, pulse 69, respirations 18, blood pressure 144/78, pulse ox 96% on room air. GENERAL:  This is a pleasant white male in no acute distress. HEENT:  Normocephalic, atraumatic with extraocular movements intact  and clear sclerae.  Nares are without discharge. NECK:  Supple without carotid bruit auscultated.  No JVD. HEART:  Auscultation of the heart reveals regular rate and rhythm with S1, S2 without murmurs, rubs, or gallops. LUNGS:  Clear to auscultation bilaterally without wheezes, rales, or rhonchi. ABDOMEN:  Soft, nontender, nondistended with positive bowel sounds. There is some residual tenderness in the right groin area, but it is no longer distended after being reduced. EXTREMITIES:  Warm, dry, and without edema, 2+ pedal pulses bilaterally. NEUROLOGIC:  He is alert and oriented x3, responds to questions appropriately with a normal affect.  ASSESSMENT AND PLAN:  The patient was seen and examined by Dr. Elease Hashimoto and myself.  This is a 77 year old gentleman with a history of coronary artery disease, nonobstructive by catheterization in 2001, nonischemic cardiomyopathy with an ejection fraction of 35%-40%, paroxysmal atrial fibrillation, and sick sinus syndrome, who presents to [facility] with abdominal pain.  He was subsequently diagnosed with inguinal hernia and we were asked to see him regarding preoperative clearance. 1. Preoperative clearance.  Using the [facility] criteria, the patient is     felt low risk for cardiac complications at this time given lack of     ischemic symptoms.  He does have a history, however, of known     coronary artery disease, so see recommendations below for further     postoperative management. 2. Coronary artery disease.  Currently felt stable.  The patient     denies any ischemic symptoms.  We recommend to continue beta-     blocker, and may even change to IV if p.o. medications are unable     to tolerated.  Per discussion with Surgery, he is okay to take p.o.     medications for now.  We would also recommend to add low-dose     aspirin postoperatively when okay with the surgeon. 3. Atrial fibrillation status post TEE/DCCV on Aug 07, 2010.  We  do     not feel a need to bridge him with heparin with his upcoming     surgery.  Resume Coumadin when okay with surgeon postoperatively.     The patient has had subtherapeutic INRs as an outpatient and with     recent cardioversion, we feel he will likely need more aggressive     dose at discharge.  Continue his Tikosyn for now as long as he is     taking p.o.'s.  However, please note that as the patient missed his     several doses, we will have to reinitiate over a 6 dose period with     QTc monitoring.  It is very important to keep his electrolytes  under control while on this, and recommend to follow his potassium     and magnesium perioperatively.  Continue digoxin and beta-blocker. 4. Nonischemic cardiomyopathy with ejection fraction of 30%-40%.     Continue his Coreg.  As above, may use IV beta-blocker if not     taking p.o.'s.  We would recommend heating caution with IV fluids     given his decreased LV function.  The patient is well compensated     at this point, but we would continue to monitor volume status on a     daily basis.  Consider adding an ACE inhibitor postoperatively if     his pressure remains okay.  In the past, this appears to have     prohibitive titration/addition. 5. Sick sinus syndrome status post pacemaker implantation.  Stable.     Continue to monitor perioperatively on telemetry.  Thank you for the opportunity to participate in the care of this patient.  We will continue to follow with you.     Ronie Spies, P.A.C.   ______________________________ Vesta Mixer, M.D.    DD/MEDQ  D:  09/27/2010  T:  09/27/2010  Job:  [id number]  cc:   Craig Morton. [name], MD, [facility]  Electronically Signed by Ronie Spies  on 10/03/2010 05:57:25 PM Electronically Signed by Kristeen Miss M.D. on 10/06/2010 12:12:58 PM

## 2010-10-07 NOTE — H&P (Signed)
  NAMEJAVION, [name]  [id number]  MEDICAL RECORD NO.:  [id number]  LOCATION:  3740                         FACILITY:  [facility]  PHYSICIAN:  Almond Lint, MD       DATE OF BIRTH:  1929-11-19  DATE OF ADMISSION:  09/27/2010 DATE OF DISCHARGE:                             HISTORY & PHYSICAL   CHIEF COMPLAINTS:  Right inguinal hernia.  HISTORY OF PRESENT ILLNESS:  [name] is a 77 year old male with acute right groin pain and bulge have around 8 this morning when he was doing yard work.  He denies nausea or vomiting.  He says he has noticed some pain and bulging there over the last couple weeks, but he does not recall any symptoms before that.  The ER gave him morphine and Ativan and reduced it, was hernia popped backout within 10 minutes.  He is nontoxic and has not had any fevers or chills.  He is pain free when we pressing on it.  PAST MEDICAL HISTORY:  Significant for atrial fibrillation and some congestive heart failure and he has pacemaker, multiple cardioversions, history of an umbilical hernia, and now right inguinal hernia.  PAST SURGICAL HISTORY:  He has 3 surgeries to repair an umbilical hernia that kept recurring and tonsillectomy.  FAMILY HISTORY:  Father had sudden death at age 73 and mother died of AAA at age of 6.  SOCIAL HISTORY:  He does not smoke, drink alcohol, or use drugs.  REVIEW OF SYSTEMS:  Otherwise negative x 11.  MEDICATIONS:  Coreg, Tikosyn, Cardizem, digoxin, mag, multi vitamins, and Protonix.  ALLERGIES:  ANTIHISTAMINE.  PHYSICAL EXAMINATION:  VITAL SIGNS:  Temperature 98.2, heart rate 69, blood pressure 140/80, respiratory rate 22, and sats 95% on room air. GENERAL:  He is alert and oriented x3, in no acute distress. HEENT:  Normocephalic and atraumatic.  Sclerae are anicteric. NECK:  Supple.  No lymphadenopathy.  No thyromegaly. HEART:  Regular. LUNGS:  Clear. ABDOMEN:  Soft, nondistended, nontender.  Right  groin demonstrates small moderate size right inguinal hernia.  This is painful with attempts to reduce but it was not painful with a gentle pressure. EXTREMITIES:  Warm and well perfused.  No pitting edema or large size hernias. PSYCH:  Normal mood and affect.  Normal behavior and judgment.  LABORATORY DATA:  White count is 12.7, hemoglobin 14.4, hematocrit 39.7, platelet 225,000, INR is 1.6.  BMET, sodium 134, potassium 4.3, chloride 101, CO2 of 25, glucose 115, BUN 17, creatinine 0.63, and calcium 8.6.  IMPRESSION:  [name] is a 77 year old male with partially incarcerated right inguinal hernia.  This is reducible, but does come back out with movement.  Obtain cardiac clearance, keep him p.o. IV fluids, his INR is downward.  If he also should start to have more pain and need more pain medication, then we will go ahead and give him some vitamin K and see him today. The case has been discussed with Dr. Elease Hashimoto.     Almond Lint, MD     FB/MEDQ  D:  09/27/2010  T:  09/27/2010  Job:  [id number]  Electronically Signed by Almond Lint MD on 10/07/2010 02:22:04 PM

## 2010-10-08 ENCOUNTER — Encounter: Payer: Self-pay | Admitting: Internal Medicine

## 2010-10-08 DIAGNOSIS — I495 Sick sinus syndrome: Secondary | ICD-10-CM

## 2010-10-23 ENCOUNTER — Ambulatory Visit (INDEPENDENT_AMBULATORY_CARE_PROVIDER_SITE_OTHER): Payer: Medicare Other | Admitting: *Deleted

## 2010-10-23 DIAGNOSIS — I4891 Unspecified atrial fibrillation: Secondary | ICD-10-CM

## 2010-10-23 LAB — POCT INR: INR: 2

## 2010-10-24 ENCOUNTER — Encounter (INDEPENDENT_AMBULATORY_CARE_PROVIDER_SITE_OTHER): Payer: Self-pay | Admitting: General Surgery

## 2010-10-27 ENCOUNTER — Encounter (INDEPENDENT_AMBULATORY_CARE_PROVIDER_SITE_OTHER): Payer: Self-pay | Admitting: General Surgery

## 2010-10-27 ENCOUNTER — Ambulatory Visit (INDEPENDENT_AMBULATORY_CARE_PROVIDER_SITE_OTHER): Payer: Medicare Other | Admitting: General Surgery

## 2010-10-27 VITALS — Temp 97.2°F

## 2010-10-27 DIAGNOSIS — K4090 Unilateral inguinal hernia, without obstruction or gangrene, not specified as recurrent: Secondary | ICD-10-CM | POA: Insufficient documentation

## 2010-10-27 NOTE — Assessment & Plan Note (Addendum)
For operative repair. Needs clearance to stop his coumadin.   Will schedule OR once we have this information.     I described the procedure in detail.  The patient was given educational material. We discussed the risks and benefits including but not limited to bleeding, infection, chronic inguinal pain, nerve entrapment, hernia recurrence, mesh complications, hematoma formation, urinary retention, injury to the testicles or the ovaries, numbness in the groin, blood clots, injury to the surrounding structures, and anesthesia risk. We also discussed the typical post operative recovery course, including no heavy lifting for 6 weeks.

## 2010-10-27 NOTE — Patient Instructions (Signed)
PATIENT INSTRUCTIONS  HERNIA  FOLLOW-UP:  Please make an appointment with your physician in 3 week(s).  Call your physician immediately if you have any fevers greater than 102.5, drainage from you wound that is not clear or looks infected, persistent bleeding, increasing abdominal pain, problems urinating, or persistent nausea/vomiting.    WOUND CARE INSTRUCTIONS:   Your wound will be covered with Dermabond surgical glue or Steristrips, gauze and tape.  If there are outer dressings in place such as gauze and tape, you should remove the outer dressing in 48 hours.  If clothing rubs against the wound or causes irritation and the wound is not draining you may cover it with a dry dressing during the daytime.  Try to keep the wound dry and avoid ointments on the wound unless directed to do so.  If the wound becomes bright red and painful or starts to drain infected material that is not clear, please contact your physician immediately.  If the wound is mildly pink and has a thick firm ridge underneath it, this is normal, and is referred to as a healing ridge.  This will resolve over the next 4-6 weeks. You may use ice packs or a heating pad on your incision, but place a shirt or towel between the ice pack/heating pad and your skin.  You may do this for 10-20 minutes every 4 hours.  You can expect to see bruising and swelling in the scrotum or labia if you have had an inguinal hernia repair.  If you have had a ventral/incisional/umbilical hernia repair, you may see some swelling where the hernia was.    DIET:  You may eat any foods that you can tolerate.  It is a good idea to eat a high fiber diet and take in plenty of fluids to prevent constipation.  If you do become constipated you may want to take a mild laxative or take Ducolax tablets on a daily basis until your bowel habits are regular.  Constipation can be very uncomfortable, along with straining, after recent abdominal surgery.  ACTIVITY:  You are  encouraged to cough and deep breath or use your incentive spirometer if you were given one, every 15-30 minutes when awake.  This will help prevent respiratory complications and low grade fevers post-operatively.  You may want to hug a pillow when coughing and sneezing to add additional support to the surgical area which will decrease pain during these times.  You are encouraged to walk and engage in light activity for the next two weeks.  You should not lift more than 20 pounds for 6 weeks as it could put you at increased risk for a hernia recurrence.  Twenty pounds is roughly equivalent to a plastic bag of groceries.    MEDICATIONS:  Try to take narcotic medications and anti-inflammatory medications, such as ibuprofen, naprosyn, etc., with food.  This will minimize stomach upset from the medication.  You may take Tylenol (acetominophen) if you are not taking your prescription medicine.  The prescription medicine has tylenol in it, and if you take both, you may overdose on tylenol which can cause liver failure.   Should you develop nausea and vomiting from the pain medication, or develop a rash, please discontinue the medication and contact your physician.  You should not drive, make important decisions, or operate machinery when taking narcotic pain medication.  QUESTIONS:  Please feel free to call our office [phone] if you have any questions, and we will be glad to assist you.    Please give all insurance/disability forms to the front desk at our office.     

## 2010-10-27 NOTE — Progress Notes (Addendum)
Chief Complaint  Patient presents with  . Follow-up    Hospital follow up    HISTORY: Patient has been doing well since he was seen in the emergency department. He has not had his hernia pop back out and get stuck. He has not had any pain. He has been tolerating a regular diet without nausea or vomiting. He has been taking his Coumadin for his atrial fibrillation. He has been back to doing his golf and his regular yardwork.  Past Medical History  Diagnosis Date  . Coronary artery disease   . Cardiomyopathy primary-nonischemic EF 45%  . Hyperlipidemia   . GERD (gastroesophageal reflux disease)   . Polymyalgia rheumatica   . Sick sinus syndrome   . Atrial fibrillation   . Dysphagia   . Hearing loss, mixed, bilateral   . Increased prostate specific antigen (PSA) velocity   . Lumbar back pain   . BPH (benign prostatic hypertrophy)   . Breast mass      Current Outpatient Prescriptions  Medication Sig Dispense Refill  . carvedilol (COREG) 12.5 MG tablet Take 1 tablet (12.5 mg total) by mouth 2 (two) times daily.  60 tablet  0  . digoxin (LANOXIN) 0.25 MG tablet Take 1 tablet (250 mcg total) by mouth daily.  90 tablet  0  . dofetilide (TIKOSYN) 500 MCG capsule Take 1 capsule (500 mcg total) by mouth 2 (two) times daily.  180 capsule  3  . finasteride (PROSCAR) 5 MG tablet Take 5 mg by mouth daily.        . Magnesium Oxide 400 (241.3 MG) MG TABS Take 1 tablet (241.3 mg total) by mouth daily.  90 tablet  3  . Multiple Vitamins-Minerals (MULTIVITAMIN WITH MINERALS) tablet Take 1 tablet by mouth daily.        . pantoprazole (PROTONIX) 40 MG tablet Take 40 mg by mouth daily.       . pravastatin (PRAVACHOL) 80 MG tablet Take 80 mg by mouth daily.        Marland Kitchen warfarin (COUMADIN) 5 MG tablet Take 1 tablet (5 mg total) by mouth as directed.  90 tablet  0     Allergies  Allergen Reactions  . Antihistamines, Diphenhydramine-Type      Family History  Problem Relation Age of Onset  . Heart  attack Father 63    deceased  . Aneurysm Mother     deceased AAA  . Hypertension Mother   . Other Brother     deceased at birth     History   Social History  . Marital Status: Married    Spouse Name: N/A    Number of Children: N/A  . Years of Education: N/A   Social History Main Topics  . Smoking status: Former Games developer  . Smokeless tobacco: None  . Alcohol Use: Yes  . Drug Use: No  . Sexually Active: None   Other Topics Concern  . None   Social History Narrative  . None     REVIEW OF SYSTEMS - PERTINENT POSITIVES ONLY:    EXAM: Filed Vitals:   10/27/10 1015  Temp: 97.2 F (36.2 C)    HENT:  Head: Normocephalic and atraumatic.  Eyes: Conjunctivae are normal. Pupils are equal, round, and reactive to light. No scleral icterus.  Neck: Normal range of motion. Neck supple. No tracheal deviation present. No thyromegaly present.  Cardiovascular: Normal rate, intact distal pulses.   Respiratory: Effort normal. No respiratory distress.   GI: Soft. Bowel sounds  are normal. The abdomen is soft and nontender.  There is no rebound and no guarding.  GU:  RIH moderate in size, reduced. Musculoskeletal: Normal range of motion. Extremities are nontender.  Lymphadenopathy: No cervical adenopathy is present Neurological: Alert and oriented to person, place, and time. Coordination normal.  Skin: Skin is warm and dry. No rash noted. No diaphoresis. No erythema. No pallor.  Psychiatric: Normal mood and affect. Behavior is normal. Judgment and thought content normal.    ASSESSMENT AND PLAN:   R inguinal hernia For operative repair. Needs clearance to stop his coumadin.   Will schedule OR once we have this information.     I described the procedure in detail.  The patient was given educational material. We discussed the risks and benefits including but not limited to bleeding, infection, chronic inguinal pain, nerve entrapment, hernia recurrence, mesh complications, hematoma  formation, urinary retention, injury to the testicles or the ovaries, numbness in the groin, blood clots, injury to the surrounding structures, and anesthesia risk. We also discussed the typical post operative recovery course, including no heavy lifting for 6 weeks.        Maudry Diego MD Surgical Oncology, General and Endocrine Surgery [facility]      Visit Diagnoses: 1. R inguinal hernia     Primary Care Physician: [name], MD, MD

## 2010-10-28 ENCOUNTER — Telehealth: Payer: Self-pay | Admitting: *Deleted

## 2010-10-28 NOTE — Discharge Summary (Signed)
  NAMEBINNIE, VONDERHAAR NO.:  [id number]  MEDICAL RECORD NO.:  [id number]  LOCATION:  3740                         FACILITY:  [facility]  PHYSICIAN:  Almond Lint, MD       DATE OF BIRTH:  Apr 18, 1929  DATE OF ADMISSION:  09/27/2010 DATE OF DISCHARGE:  09/28/2010                              DISCHARGE SUMMARY   PRINCIPAL COMPLAINT:  Right inguinal hernia.  PRINCIPAL PROCEDURE:  Resection of right inguinal hernia.  SECONDARY DIAGNOSES:  Atrial fibrillation, pacemaker, multiple cardioversions.  LABORATORY FINDINGS:  At admission, the patient's white count was 12.7 and it came down to 8.2 after reduction of hernia.  Electrolytes were within acceptable limits.  HOSPITAL COURSE:  Mr. Katich is a 77 year old male who was admitted to the ER after hernia that was reduced popped back out within 10 minutes.  He is still having significant pain.  He was admitted and had around 24 hours of observation in which his hernia was not acutely stayed reduced.  He is having no pain and would have to wait a significant amount of time to have his operation.  He wanted to go home and schedule a repair electively.  He was discharged to home on Avodart 0.5 mg Monday, Wednesday, and Friday, carvedilol 12.5 mg twice a day, digoxin 0.25 mg once a day, Mag-Ox 400 mg once a day, multivitamins once a day, pantoprazole 40 mg once a day, pravastatin 80 mg once a day, Tikosyn twice a day 500 mcg and warfarin 2.5 mg on Tuesday, Thursday and Saturday and 5 mg on the other days of the week.  The patient will follow up with myself or Dr. Corliss Skains in clinic who will schedule elective repair.     Almond Lint, MD     FB/MEDQ  D:  10/23/2010  T:  10/23/2010  Job:  [id number]  Electronically Signed by Almond Lint MD on 10/28/2010 12:12:53 PM

## 2010-10-28 NOTE — Telephone Encounter (Signed)
Pt has inguinal hernia surgery on 8-30 with dr byerly[facility]-- pt not familiar with this md, are you?  Had umbilical hernia 25 years ago and had lots of problems.anything he could do to prevent that?

## 2010-10-29 ENCOUNTER — Telehealth: Payer: Self-pay

## 2010-10-29 NOTE — Telephone Encounter (Signed)
Pt's wife aware.

## 2010-10-29 NOTE — Telephone Encounter (Signed)
Yes

## 2010-10-29 NOTE — Telephone Encounter (Signed)
Opened in error

## 2010-10-29 NOTE — Telephone Encounter (Signed)
Pt called again to ask Dr. Cato Mulligan if he thinks it is ok for pt to go through with the hernia surgery by Dr. Donell Beers.   Pt also states that [facility] was suppose to contact Dr. Cato Mulligan in regards to pt d/c coumadin until after surgery is done.   Please advise

## 2010-11-04 ENCOUNTER — Ambulatory Visit (INDEPENDENT_AMBULATORY_CARE_PROVIDER_SITE_OTHER): Payer: Medicare Other | Admitting: Internal Medicine

## 2010-11-04 ENCOUNTER — Encounter: Payer: Self-pay | Admitting: Internal Medicine

## 2010-11-04 VITALS — BP 134/78 | HR 76 | Temp 98.0°F | Ht 67.5 in | Wt 168.0 lb

## 2010-11-04 DIAGNOSIS — I4891 Unspecified atrial fibrillation: Secondary | ICD-10-CM

## 2010-11-04 DIAGNOSIS — I2510 Atherosclerotic heart disease of native coronary artery without angina pectoris: Secondary | ICD-10-CM

## 2010-11-04 DIAGNOSIS — M353 Polymyalgia rheumatica: Secondary | ICD-10-CM

## 2010-11-04 DIAGNOSIS — K4090 Unilateral inguinal hernia, without obstruction or gangrene, not specified as recurrent: Secondary | ICD-10-CM

## 2010-11-04 NOTE — Progress Notes (Signed)
  Subjective:    Patient ID: [name], male    DOB: [birth year], 77 y.o.   MRN: [id number]  HPI AFIB---self converted after 11 days  NICM---no sxs  Inguinal hernia---needs correction---see hospital records  Back pain---better with exercise.   Past Medical History  Diagnosis Date  . Coronary artery disease   . Cardiomyopathy primary-nonischemic EF 45%  . Hyperlipidemia   . GERD (gastroesophageal reflux disease)   . Polymyalgia rheumatica   . Sick sinus syndrome   . Atrial fibrillation   . Dysphagia   . Hearing loss, mixed, bilateral   . Increased prostate specific antigen (PSA) velocity   . Lumbar back pain   . BPH (benign prostatic hypertrophy)   . Breast mass    Past Surgical History  Procedure Date  . Cataract extraction     left  . Umbilical hernia repair   . Insert / replace / remove pacemaker     reports that he quit smoking about 27 years ago. His smoking use included Cigarettes. He does not have any smokeless tobacco history on file. He reports that he drinks alcohol. He reports that he does not use illicit drugs. family history includes Aneurysm in his mother; Heart attack (age of onset:33) in his father; Hypertension in his mother; and Other in his brother. Allergies  Allergen Reactions  . Antihistamines, Diphenhydramine-Type     Review of Systems  patient denies chest pain, shortness of breath, orthopnea. Denies lower extremity edema, abdominal pain, change in appetite, change in bowel movements. Patient denies rashes, musculoskeletal complaints. No other specific complaints in a complete review of systems.      Objective:   Physical Exam  well-developed well-nourished male in no acute distress. HEENT exam atraumatic, normocephalic, neck supple without jugular venous distention. Chest clear to auscultation cardiac exam S1-S2 are regular. Abdominal exam overweight with bowel sounds, soft and nontender. Extremities no edema. Neurologic exam is alert with  a normal gait.        Assessment & Plan:

## 2010-11-04 NOTE — Assessment & Plan Note (Addendum)
Needs repair/ Reviewed Dr. Tenna Child notes.

## 2010-11-06 ENCOUNTER — Encounter: Payer: Medicare Other | Admitting: *Deleted

## 2010-11-10 NOTE — Assessment & Plan Note (Signed)
No recurrent symptoms.

## 2010-11-10 NOTE — Assessment & Plan Note (Signed)
Symptoms have essentially resolved. 

## 2010-11-10 NOTE — Assessment & Plan Note (Signed)
He needs evaluation prior to inguinal hernia surgery.

## 2010-11-18 ENCOUNTER — Ambulatory Visit (INDEPENDENT_AMBULATORY_CARE_PROVIDER_SITE_OTHER): Payer: Medicare Other | Admitting: Surgery

## 2010-11-19 ENCOUNTER — Ambulatory Visit (INDEPENDENT_AMBULATORY_CARE_PROVIDER_SITE_OTHER): Payer: Medicare Other | Admitting: *Deleted

## 2010-11-19 DIAGNOSIS — I4891 Unspecified atrial fibrillation: Secondary | ICD-10-CM

## 2010-12-03 ENCOUNTER — Ambulatory Visit (INDEPENDENT_AMBULATORY_CARE_PROVIDER_SITE_OTHER): Payer: Medicare Other | Admitting: Surgery

## 2010-12-03 ENCOUNTER — Encounter (INDEPENDENT_AMBULATORY_CARE_PROVIDER_SITE_OTHER): Payer: Self-pay | Admitting: Surgery

## 2010-12-03 ENCOUNTER — Encounter (INDEPENDENT_AMBULATORY_CARE_PROVIDER_SITE_OTHER): Payer: Self-pay

## 2010-12-03 VITALS — BP 114/64 | HR 76 | Temp 97.2°F | Resp 16 | Ht 68.0 in | Wt 170.2 lb

## 2010-12-03 DIAGNOSIS — K4090 Unilateral inguinal hernia, without obstruction or gangrene, not specified as recurrent: Secondary | ICD-10-CM

## 2010-12-03 NOTE — Patient Instructions (Signed)
We will obtain cardiac clearance from Dr. [name], then we will contact you to schedule your hernia repair.

## 2010-12-03 NOTE — Progress Notes (Signed)
Chief Complaint  Patient presents with  . Inguinal Hernia    right      HISTORY: Patient has been doing well since he was seen in the emergency department. He has not had his hernia pop back out and get stuck. He has not had any pain. He has been tolerating a regular diet without nausea or vomiting. He has been taking his Coumadin for his atrial fibrillation. He has been back to doing his golf and his regular yardwork.  He was previously evaluated by Dr. Donell Beers for his reducible right inguinal hernia.  He remembers me from his hospitalization and wants me to perform his surgery.  His hernia remains minimally symptomatic. Past Medical History  Diagnosis Date  . Coronary artery disease   . Cardiomyopathy primary-nonischemic EF 45%  . Hyperlipidemia   . GERD (gastroesophageal reflux disease)   . Polymyalgia rheumatica   . Sick sinus syndrome   . Atrial fibrillation   . Dysphagia   . Hearing loss, mixed, bilateral   . Increased prostate specific antigen (PSA) velocity   . Lumbar back pain   . BPH (benign prostatic hypertrophy)   . Breast mass      Current Outpatient Prescriptions  Medication Sig Dispense Refill  . carvedilol (COREG) 12.5 MG tablet Take 1 tablet (12.5 mg total) by mouth 2 (two) times daily.  60 tablet  0  . digoxin (LANOXIN) 0.25 MG tablet Take 1 tablet (250 mcg total) by mouth daily.  90 tablet  0  . dofetilide (TIKOSYN) 500 MCG capsule Take 1 capsule (500 mcg total) by mouth 2 (two) times daily.  180 capsule  3  . finasteride (PROSCAR) 5 MG tablet Take 5 mg by mouth. 3 days weekly      . magnesium gluconate (MAGONATE) 500 MG tablet Take 500 mg by mouth 2 (two) times daily.        . Multiple Vitamins-Minerals (MULTIVITAMIN WITH MINERALS) tablet Take 1 tablet by mouth daily.        . pantoprazole (PROTONIX) 40 MG tablet Take 40 mg by mouth daily.       . pravastatin (PRAVACHOL) 80 MG tablet Take 80 mg by mouth daily.        Marland Kitchen warfarin (COUMADIN) 5 MG tablet Take 1  tablet (5 mg total) by mouth as directed.  90 tablet  0     Allergies  Allergen Reactions  . Antihistamines, Diphenhydramine-Type      Family History  Problem Relation Age of Onset  . Heart attack Father 64    deceased  . Aneurysm Mother     deceased AAA  . Hypertension Mother   . Other Brother     deceased at birth     History   Social History  . Marital Status: Married    Spouse Name: N/A    Number of Children: N/A  . Years of Education: N/A   Social History Main Topics  . Smoking status: Former Smoker    Types: Cigarettes    Quit date: 03/10/1983  . Smokeless tobacco: None  . Alcohol Use: Yes  . Drug Use: No  . Sexually Active: None   Other Topics Concern  . None   Social History Narrative  . None     REVIEW OF SYSTEMS - PERTINENT POSITIVES ONLY:    EXAM: Filed Vitals:   12/03/10 1019  BP: 114/64  Pulse: 76  Temp: 97.2 F (36.2 C)  Resp: 16    HENT:  Head: Normocephalic  and atraumatic.  Eyes: Conjunctivae are normal. Pupils are equal, round, and reactive to light. No scleral icterus.  Neck: Normal range of motion. Neck supple. No tracheal deviation present. No thyromegaly present.  Cardiovascular: Normal rate, intact distal pulses.   Respiratory: Effort normal. No respiratory distress.   GI: Soft. Bowel sounds are normal. The abdomen is soft and nontender.  There is no rebound and no guarding.  GU:  RIH moderate in size, reduced. Musculoskeletal: Normal range of motion. Extremities are nontender.  Lymphadenopathy: No cervical adenopathy is present Neurological: Alert and oriented to person, place, and time. Coordination normal.  Skin: Skin is warm and dry. No rash noted. No diaphoresis. No erythema. No pallor.  Psychiatric: Normal mood and affect. Behavior is normal. Judgment and thought content normal.    Imp:  Right inguinal hernia  Plan:  Right inguinal hernia repair with mesh.   I described the procedure in detail.  The patient was  given educational material. We discussed the risks and benefits including but not limited to bleeding, infection, chronic inguinal pain, nerve entrapment, hernia recurrence, mesh complications, hematoma formation, urinary retention, injury to the testicles, numbness in the groin, blood clots, injury to the surrounding structures, and anesthesia risk. We also discussed the typical post operative recovery course, including no heavy lifting for 6 weeks.  We will first obtain cardiac clearance from Dr. Rosalyn Charters office.    Visit Diagnoses: 1. R inguinal hernia     Primary Care Physician: [name], MD, MD

## 2010-12-15 ENCOUNTER — Telehealth: Payer: Self-pay | Admitting: *Deleted

## 2010-12-15 NOTE — Telephone Encounter (Signed)
Pt is asking if Dr. Cato Mulligan has heard from Dr. Corliss Skains re: his hernia surgery.  The chart reads that Dr. [name] needs to do a medical clearance?  Is this correct?

## 2010-12-16 ENCOUNTER — Telehealth: Payer: Self-pay | Admitting: Cardiology

## 2010-12-16 NOTE — Telephone Encounter (Signed)
Notified wife to check and see if the medical clearance has been done by Dr. [name].

## 2010-12-16 NOTE — Telephone Encounter (Signed)
Pt calling in regards to getting hernia procedure scheduled. In order to proceed with surgery pt needs surgical clearance from Dr. [name] sent to [facility], Dr. Lynann Bologna.  Pt doesn't want to wait until about next week so pt can go ahead and schedule hernia procedure.

## 2010-12-16 NOTE — Telephone Encounter (Signed)
Yes, I have heard.  Ok to get clearance with dr. [name]

## 2010-12-17 NOTE — Telephone Encounter (Signed)
The patient has a history of recurrent atrial fibrillation, nonischemic cardiomyopathy, and nonobstructive CAD.  He has been stable, and is able to exceed 4 mets of activity without incident.  He should be stable for surgery.  Warfarin should be discontinued prior to surgery, and resumed when appropriate after surgery.  He does not need to be bridged.  Risk of stroke is slightly increased with discontinuation of anticoagulation for a surgical procedure, but that would always be true.  Anesthesia should be mindful of volume during surgery.  Bonnee Quin, MD, [facility]

## 2010-12-17 NOTE — Telephone Encounter (Signed)
I spoke with the pt and made him aware of Dr Rosalyn Charters recommendations.  This note was sent through [facility] to Brennan Bailey MA with [facility].

## 2010-12-22 ENCOUNTER — Encounter: Payer: Self-pay | Admitting: Cardiology

## 2010-12-22 ENCOUNTER — Ambulatory Visit (INDEPENDENT_AMBULATORY_CARE_PROVIDER_SITE_OTHER): Payer: Medicare Other | Admitting: *Deleted

## 2010-12-22 ENCOUNTER — Ambulatory Visit (INDEPENDENT_AMBULATORY_CARE_PROVIDER_SITE_OTHER): Payer: Medicare Other | Admitting: Cardiology

## 2010-12-22 VITALS — BP 94/54 | HR 69 | Resp 18 | Ht 68.0 in | Wt 170.0 lb

## 2010-12-22 DIAGNOSIS — I4891 Unspecified atrial fibrillation: Secondary | ICD-10-CM

## 2010-12-22 DIAGNOSIS — E785 Hyperlipidemia, unspecified: Secondary | ICD-10-CM

## 2010-12-22 DIAGNOSIS — I495 Sick sinus syndrome: Secondary | ICD-10-CM

## 2010-12-22 DIAGNOSIS — E7800 Pure hypercholesterolemia, unspecified: Secondary | ICD-10-CM

## 2010-12-22 DIAGNOSIS — I428 Other cardiomyopathies: Secondary | ICD-10-CM

## 2010-12-22 DIAGNOSIS — I2510 Atherosclerotic heart disease of native coronary artery without angina pectoris: Secondary | ICD-10-CM

## 2010-12-22 NOTE — Patient Instructions (Signed)
Your physician recommends that you continue on your current medications as directed. Please refer to the Current Medication list given to you today.  Your physician wants you to follow-up in: 6 MONTHS. You will receive a reminder letter in the mail two months in advance. If you don't receive a letter, please call our office to schedule the follow-up appointment.  

## 2010-12-23 ENCOUNTER — Telehealth: Payer: Self-pay | Admitting: *Deleted

## 2010-12-23 NOTE — Progress Notes (Signed)
HPI:  [name] is in for follow up.  He really is doing quite well.  He is staying active, and has a regular exercsie program.  No current issues.  Has been staying in rhythm.  Denies any chest pain at present.  He wants to have an inguinal hernia repair. He is easily able to go 4 mets without any symptoms, and really quite a bit more.  Therefore, low risk surgery accompanied by lack of any meaningful symptoms.    Current Outpatient Prescriptions  Medication Sig Dispense Refill  . carvedilol (COREG) 12.5 MG tablet Take 1 tablet (12.5 mg total) by mouth 2 (two) times daily.  60 tablet  0  . digoxin (LANOXIN) 0.25 MG tablet Take 1 tablet (250 mcg total) by mouth daily.  90 tablet  0  . dofetilide (TIKOSYN) 500 MCG capsule Take 1 capsule (500 mcg total) by mouth 2 (two) times daily.  180 capsule  3  . finasteride (PROSCAR) 5 MG tablet Take 5 mg by mouth. 3 days weekly      . magnesium gluconate (MAGONATE) 500 MG tablet Take 500 mg by mouth daily.       . Multiple Vitamins-Minerals (MULTIVITAMIN WITH MINERALS) tablet Take 1 tablet by mouth daily.        . pantoprazole (PROTONIX) 40 MG tablet Take 40 mg by mouth daily.        . pravastatin (PRAVACHOL) 80 MG tablet Take 80 mg by mouth daily.        Marland Kitchen warfarin (COUMADIN) 5 MG tablet Take 1 tablet (5 mg total) by mouth as directed.  90 tablet  0    Allergies  Allergen Reactions  . Antihistamines, Diphenhydramine-Type     Past Medical History  Diagnosis Date  . Coronary artery disease   . Cardiomyopathy primary-nonischemic EF 45%  . Hyperlipidemia   . GERD (gastroesophageal reflux disease)   . Polymyalgia rheumatica   . Sick sinus syndrome   . Atrial fibrillation   . Dysphagia   . Hearing loss, mixed, bilateral   . Increased prostate specific antigen (PSA) velocity   . Lumbar back pain   . BPH (benign prostatic hypertrophy)   . Breast mass     Past Surgical History  Procedure Date  . Cataract extraction     left  . Umbilical  hernia repair   . Insert / replace / remove pacemaker     Family History  Problem Relation Age of Onset  . Heart attack Father 4    deceased  . Aneurysm Mother     deceased AAA  . Hypertension Mother   . Other Brother     deceased at birth    History   Social History  . Marital Status: Married    Spouse Name: N/A    Number of Children: N/A  . Years of Education: N/A   Occupational History  . Not on file.   Social History Main Topics  . Smoking status: Former Smoker    Types: Cigarettes    Quit date: 03/10/1983  . Smokeless tobacco: Not on file  . Alcohol Use: Yes  . Drug Use: No  . Sexually Active: Not on file   Other Topics Concern  . Not on file   Social History Narrative  . No narrative on file    ROS: Please see the HPI.  All other systems reviewed and negative.  PHYSICAL EXAM:  BP 94/54  Pulse 69  Resp 18  Ht 5\' 8"  (1.727  m)  Wt 170 lb (77.111 kg)  BMI 25.85 kg/m2  General: Well developed, well nourished, in no acute distress. Head:  Normocephalic and atraumatic. Neck: no JVD Lungs: Clear to auscultation and percussion. Heart: Normal S1 and S2.  No murmur, rubs or gallops.  Abdomen:  Normal bowel sounds; soft; non tender; no organomegaly Pulses: Pulses normal in all 4 extremities. Extremities: No clubbing or cyanosis. No edema. Neurologic: Alert and oriented x 3.  EKG:  Atrial ventricular pacing.   ASSESSMENT AND PLAN:

## 2010-12-23 NOTE — Telephone Encounter (Signed)
Pt wants to know when to get his next pneumonia vaccine?  Last one 11/10/2003

## 2010-12-24 ENCOUNTER — Telehealth: Payer: Self-pay

## 2010-12-24 ENCOUNTER — Encounter (HOSPITAL_COMMUNITY)
Admission: RE | Admit: 2010-12-24 | Discharge: 2010-12-24 | Disposition: A | Discharge status: Home / Self Care | Payer: Medicare Other | Source: Ambulatory Visit | Attending: Surgery | Admitting: Surgery

## 2010-12-24 LAB — CBC
HCT: 42.2 % (ref 39.0–52.0)
MCH: 30.3 pg (ref 26.0–34.0)
MCV: 84.6 fL (ref 78.0–100.0)
RBC: 4.99 MIL/uL (ref 4.22–5.81)
WBC: 7.4 10*3/uL (ref 4.0–10.5)

## 2010-12-24 LAB — BASIC METABOLIC PANEL
CO2: 28 mEq/L (ref 19–32)
Chloride: 104 mEq/L (ref 96–112)
Sodium: 139 mEq/L (ref 135–145)

## 2010-12-24 LAB — APTT: aPTT: 36 seconds (ref 24–37)

## 2010-12-24 LAB — SURGICAL PCR SCREEN
MRSA, PCR: NEGATIVE
Staphylococcus aureus: NEGATIVE

## 2010-12-24 NOTE — Assessment & Plan Note (Signed)
LDL is near target on pravastatin 80mg .  Tolerates well.

## 2010-12-24 NOTE — Assessment & Plan Note (Signed)
Has not been cathed in quite some time, but seems to be doing well. No angina.  Remains on statins.

## 2010-12-24 NOTE — Progress Notes (Signed)
Quick Note:  This patient may proceed with surgery. Recheck a PT/INR on the morning of surgery. ______

## 2010-12-24 NOTE — Telephone Encounter (Signed)
Pt rtn call, pls call [phone]

## 2010-12-24 NOTE — Telephone Encounter (Signed)
Lauren  Can you contact Mr. Shellia Carwin. I think a repeat echo prior to his surgery would be appropriate. Thanks [name]  I left a message for the [name] to call back about scheduling echo.  At this time I am unsure of the [name]'s surgery date.

## 2010-12-24 NOTE — Telephone Encounter (Signed)
Pt is due, schedule in one of my injection slots

## 2010-12-24 NOTE — Assessment & Plan Note (Signed)
Last echo was about a year ago, and EF was reduced, but not acting that way now. Usually goes down when he is out of rhythm.  Probably would be of help prior to surgery to know current status.

## 2010-12-24 NOTE — Telephone Encounter (Signed)
I spoke with the pt and his surgery is scheduled for 12/29/10.  I attempted to get the pt set up for an ECHO but we do not have availability in the office Thursday or Friday.  I made Dr [name] aware and the pt can proceed with surgery without ECHO.

## 2010-12-24 NOTE — Assessment & Plan Note (Signed)
Seems to be maintaining NSR on Tikosyn.

## 2010-12-24 NOTE — Assessment & Plan Note (Signed)
Status post pacing. 

## 2010-12-25 ENCOUNTER — Ambulatory Visit (INDEPENDENT_AMBULATORY_CARE_PROVIDER_SITE_OTHER): Payer: Medicare Other | Admitting: Internal Medicine

## 2010-12-25 DIAGNOSIS — Z23 Encounter for immunization: Secondary | ICD-10-CM

## 2010-12-26 NOTE — Telephone Encounter (Signed)
Pt was in today for injection

## 2010-12-29 ENCOUNTER — Ambulatory Visit (HOSPITAL_COMMUNITY)
Admission: RE | Admit: 2010-12-29 | Discharge: 2010-12-29 | Disposition: A | Discharge status: Home / Self Care | Payer: Medicare Other | Source: Ambulatory Visit | Attending: Surgery | Admitting: Surgery

## 2010-12-29 DIAGNOSIS — Z7901 Long term (current) use of anticoagulants: Secondary | ICD-10-CM | POA: Insufficient documentation

## 2010-12-29 DIAGNOSIS — K4090 Unilateral inguinal hernia, without obstruction or gangrene, not specified as recurrent: Secondary | ICD-10-CM

## 2010-12-29 DIAGNOSIS — I4891 Unspecified atrial fibrillation: Secondary | ICD-10-CM | POA: Insufficient documentation

## 2010-12-29 DIAGNOSIS — Z01812 Encounter for preprocedural laboratory examination: Secondary | ICD-10-CM | POA: Insufficient documentation

## 2010-12-29 DIAGNOSIS — I2510 Atherosclerotic heart disease of native coronary artery without angina pectoris: Secondary | ICD-10-CM | POA: Insufficient documentation

## 2010-12-29 LAB — PROTIME-INR
INR: 1.02 (ref 0.00–1.49)
Prothrombin Time: 13.6 seconds (ref 11.6–15.2)

## 2010-12-31 NOTE — Op Note (Signed)
  NAMEGADIEL, [name]  [id number]  MEDICAL RECORD NO.:  [id number]  LOCATION:  [facility]SC                         FACILITY:  [facility]  PHYSICIAN:  Wilmon Arms. Corliss Skains, M.D. DATE OF BIRTH:  1929-04-14  DATE OF PROCEDURE:  12/29/2010 DATE OF DISCHARGE:                              OPERATIVE REPORT   PREOPERATIVE DIAGNOSIS:  Right inguinal hernia.  POSTOPERATIVE DIAGNOSIS:  Right inguinal hernia.  PROCEDURE:  Right inguinal hernia repair with mesh.  SURGEON:  Wilmon Arms. Corliss Skains, MD  ANESTHESIA:  General.  INDICATIONS:  This is a 77 year old male who was recently seen in the hospital for an incarcerated inguinal hernia.  This was reduced.  He presents now for right inguinal hernia repair with mesh.  DESCRIPTION OF PROCEDURE:  The patient was brought to the operating room and placed in the supine position on operating room table.  After an adequate level of general anesthesia was obtained, his right groin was shaved, prepped with ChloraPrep, and draped in sterile fashion.  Time- out was taken to assure proper patient, proper procedure.  We made an oblique incision above the right inguinal ligament.  Dissection was carried down through the subcutaneous tissues with cautery.  We dissected down to the external oblique fascia.  We opened the fascia along the direction of its fibers down to the external ring.  We bluntly dissected around the spermatic cord and retracted this with a Penrose drain laterally.  The floor of the inguinal canal was lax and there was no direct defect.  There was a fairly loose internal ring.  We skeletonized the spermatic cord and identified a very large indirect hernia sac.  We reduced this back up to the internal ring and tightened the ring with 2-0 Vicryl.  The floor of the inguinal canal was also reinforced with a 0-Vicryl.  We took a 3 x 6 inch piece of UltraPro mesh and cut this in a keyhole shape.  We secured this with a 2-0  Prolene beginning at the pubic tubercle.  We ran this along the shelving edge inferiorly and internal oblique fascia superiorly.  The tails of the mesh were sutured together behind the spermatic cord.  We tucked the mesh underneath the external oblique fascia.  The fascia was reapproximated with 2-0 Vicryl.  We infiltrated the fascia and subcutaneous tissues with Exparel.  3-0 Vicryl was used to close the subcutaneous tissues and 4-0 Monocryl was used to close the skin.  Steri-Strips and clean dressings were applied.  The patient was then extubated and brought to the recovery room in a stable condition.  All sponge, instrument, and needle counts were correct.     Wilmon Arms. Corliss Skains, M.D.     MKT/MEDQ  D:  12/29/2010  T:  12/29/2010  Job:  [id number]  Electronically Signed by Manus Rudd M.D. on 12/31/2010 04:39:20 AM

## 2011-01-02 ENCOUNTER — Encounter (INDEPENDENT_AMBULATORY_CARE_PROVIDER_SITE_OTHER): Payer: Self-pay | Admitting: Surgery

## 2011-01-07 ENCOUNTER — Ambulatory Visit (INDEPENDENT_AMBULATORY_CARE_PROVIDER_SITE_OTHER): Payer: Medicare Other | Admitting: *Deleted

## 2011-01-07 DIAGNOSIS — I4891 Unspecified atrial fibrillation: Secondary | ICD-10-CM

## 2011-01-07 LAB — POCT INR: INR: 1.5

## 2011-01-14 ENCOUNTER — Ambulatory Visit (INDEPENDENT_AMBULATORY_CARE_PROVIDER_SITE_OTHER): Payer: Medicare Other | Admitting: Surgery

## 2011-01-14 ENCOUNTER — Encounter (INDEPENDENT_AMBULATORY_CARE_PROVIDER_SITE_OTHER): Payer: Self-pay | Admitting: Surgery

## 2011-01-14 VITALS — BP 112/68 | HR 64 | Temp 97.3°F | Resp 16 | Ht 68.0 in | Wt 167.0 lb

## 2011-01-14 DIAGNOSIS — K4090 Unilateral inguinal hernia, without obstruction or gangrene, not specified as recurrent: Secondary | ICD-10-CM

## 2011-01-14 NOTE — Progress Notes (Signed)
S/p right inguinal hernia repair with mesh on 12/29/10.  He had a large indirect hernia.  He is doing quite well.  Minimal skin sensitivity.  The incision seems to be healing well with no sign of infection.  No sign of recurrent hernia.    He may begin increasing his level of activity.  Resume full activity six weeks post-op.  Follow-up PRn

## 2011-01-27 ENCOUNTER — Ambulatory Visit (INDEPENDENT_AMBULATORY_CARE_PROVIDER_SITE_OTHER): Payer: Medicare Other | Admitting: *Deleted

## 2011-01-27 DIAGNOSIS — I4891 Unspecified atrial fibrillation: Secondary | ICD-10-CM

## 2011-03-06 ENCOUNTER — Ambulatory Visit (INDEPENDENT_AMBULATORY_CARE_PROVIDER_SITE_OTHER): Payer: Medicare Other | Admitting: *Deleted

## 2011-03-06 DIAGNOSIS — I4891 Unspecified atrial fibrillation: Secondary | ICD-10-CM

## 2011-03-13 DIAGNOSIS — I495 Sick sinus syndrome: Secondary | ICD-10-CM | POA: Diagnosis not present

## 2011-03-14 ENCOUNTER — Encounter: Payer: Self-pay | Admitting: Internal Medicine

## 2011-03-14 DIAGNOSIS — I495 Sick sinus syndrome: Secondary | ICD-10-CM | POA: Diagnosis not present

## 2011-03-16 ENCOUNTER — Other Ambulatory Visit: Payer: Self-pay | Admitting: Cardiology

## 2011-03-16 DIAGNOSIS — N529 Male erectile dysfunction, unspecified: Secondary | ICD-10-CM | POA: Diagnosis not present

## 2011-03-16 DIAGNOSIS — N139 Obstructive and reflux uropathy, unspecified: Secondary | ICD-10-CM | POA: Diagnosis not present

## 2011-03-16 MED ORDER — DOFETILIDE 500 MCG PO CAPS: 500.0000 ug | ORAL_CAPSULE | Freq: Two times a day (BID) | ORAL | Status: DC

## 2011-03-16 NOTE — Telephone Encounter (Signed)
Pt wants a 90 day supply called into [facility] on [address] pt only has enough for a couple of days and if you need him too he can come by and pick it up. Please call and let him know when he can pick it up

## 2011-03-16 NOTE — Telephone Encounter (Signed)
I spoke with the pt and made him aware that Rx was sent to the pharmacy.

## 2011-04-03 ENCOUNTER — Ambulatory Visit (INDEPENDENT_AMBULATORY_CARE_PROVIDER_SITE_OTHER): Payer: Medicare Other | Admitting: *Deleted

## 2011-04-03 DIAGNOSIS — I4891 Unspecified atrial fibrillation: Secondary | ICD-10-CM

## 2011-04-08 ENCOUNTER — Encounter: Payer: Self-pay | Admitting: Internal Medicine

## 2011-04-08 ENCOUNTER — Ambulatory Visit (INDEPENDENT_AMBULATORY_CARE_PROVIDER_SITE_OTHER): Payer: Medicare Other | Admitting: Internal Medicine

## 2011-04-08 VITALS — BP 116/64 | HR 76 | Temp 98.1°F | Wt 167.0 lb

## 2011-04-08 DIAGNOSIS — R059 Cough, unspecified: Secondary | ICD-10-CM

## 2011-04-08 DIAGNOSIS — R05 Cough: Secondary | ICD-10-CM

## 2011-04-08 DIAGNOSIS — K219 Gastro-esophageal reflux disease without esophagitis: Secondary | ICD-10-CM | POA: Diagnosis not present

## 2011-04-08 DIAGNOSIS — E785 Hyperlipidemia, unspecified: Secondary | ICD-10-CM

## 2011-04-08 DIAGNOSIS — M353 Polymyalgia rheumatica: Secondary | ICD-10-CM

## 2011-04-08 DIAGNOSIS — I10 Essential (primary) hypertension: Secondary | ICD-10-CM

## 2011-04-08 MED ORDER — FLUTICASONE PROPIONATE 50 MCG/ACT NA SUSP: 2.0000 | Freq: Every day | NASAL | Status: DC

## 2011-04-08 MED ORDER — PANTOPRAZOLE SODIUM 40 MG PO TBEC: 40.0000 mg | DELAYED_RELEASE_TABLET | Freq: Every day | ORAL | Status: DC

## 2011-04-08 MED ORDER — DIGOXIN 250 MCG PO TABS: 250.0000 ug | ORAL_TABLET | Freq: Every day | ORAL | Status: DC

## 2011-04-08 MED ORDER — CARVEDILOL 12.5 MG PO TABS: 12.5000 mg | ORAL_TABLET | Freq: Two times a day (BID) | ORAL | Status: DC

## 2011-04-08 MED ORDER — PRAVASTATIN SODIUM 80 MG PO TABS: 80.0000 mg | ORAL_TABLET | Freq: Every day | ORAL | Status: DC

## 2011-04-08 MED ORDER — WARFARIN SODIUM 5 MG PO TABS: 5.0000 mg | ORAL_TABLET | ORAL | Status: DC

## 2011-04-08 NOTE — Assessment & Plan Note (Signed)
Previously controlled 

## 2011-04-08 NOTE — Assessment & Plan Note (Signed)
Prednisone has been completed

## 2011-04-08 NOTE — Progress Notes (Signed)
Patient ID: [name], male   DOB: 08/06/1929, 78 y.o.   MRN: [id number] Cough for 4 + weeks- typically dry cough. No wheeze, no fever. He does not feel poorly. Cough can be nocturnal   PMR.no longer taking prednisone  AFIB--no bleeding complications, no recurrence of sxs  Past Medical History  Diagnosis Date  . Coronary artery disease   . Cardiomyopathy primary-nonischemic EF 45%  . Hyperlipidemia   . GERD (gastroesophageal reflux disease)   . Polymyalgia rheumatica   . Sick sinus syndrome   . Atrial fibrillation   . Dysphagia   . Hearing loss, mixed, bilateral   . Increased prostate specific antigen (PSA) velocity   . Lumbar back pain   . BPH (benign prostatic hypertrophy)   . Breast mass     History   Social History  . Marital Status: Married    Spouse Name: N/A    Number of Children: N/A  . Years of Education: N/A   Occupational History  . Not on file.   Social History Main Topics  . Smoking status: Former Smoker    Types: Cigarettes    Quit date: 03/10/1983  . Smokeless tobacco: Never Used  . Alcohol Use: Yes  . Drug Use: No  . Sexually Active: Not on file   Other Topics Concern  . Not on file   Social History Narrative  . No narrative on file    Past Surgical History  Procedure Date  . Cataract extraction     left  . Umbilical hernia repair   . Insert / replace / remove pacemaker   . Hernia repair 2012    RIH    Family History  Problem Relation Age of Onset  . Heart attack Father 14    deceased  . Aneurysm Mother     deceased AAA  . Hypertension Mother   . Other Brother     deceased at birth    Allergies  Allergen Reactions  . Antihistamines, Diphenhydramine-Type Other (See Comments)    Causes difficulty in ability to urinate.    Current Outpatient Prescriptions on File Prior to Visit  Medication Sig Dispense Refill  . carvedilol (COREG) 12.5 MG tablet Take 1 tablet (12.5 mg total) by mouth 2 (two) times daily.  60 tablet  0    . digoxin (LANOXIN) 0.25 MG tablet Take 1 tablet (250 mcg total) by mouth daily.  90 tablet  0  . dofetilide (TIKOSYN) 500 MCG capsule Take 1 capsule (500 mcg total) by mouth 2 (two) times daily.  180 capsule  3  . Multiple Vitamins-Minerals (MULTIVITAMIN WITH MINERALS) tablet Take 1 tablet by mouth daily.        . pantoprazole (PROTONIX) 40 MG tablet Take 40 mg by mouth daily.        . pravastatin (PRAVACHOL) 80 MG tablet Take 80 mg by mouth daily.        Marland Kitchen warfarin (COUMADIN) 5 MG tablet Take 1 tablet (5 mg total) by mouth as directed.  90 tablet  0     patient denies chest pain, shortness of breath, orthopnea. Denies lower extremity edema, abdominal pain, change in appetite, change in bowel movements. Patient denies rashes, musculoskeletal complaints. No other specific complaints in a complete review of systems.   BP 116/64  Pulse 76  Temp(Src) 98.1 F (36.7 C) (Oral)  Wt 167 lb (75.751 kg)  well-developed well-nourished male in no acute distress. HEENT exam atraumatic, normocephalic, neck supple without jugular venous distention.  Chest clear to auscultation cardiac exam S1-S2 are regular. Abdominal exam overweight with bowel sounds, soft and nontender. Extremities no edema. Neurologic exam is alert with a normal gait.  Cough---reviewed CXR--could be nasal drip---trial nasal steroid

## 2011-04-08 NOTE — Assessment & Plan Note (Signed)
No sxs and doubt that it is contributing to cough

## 2011-05-13 ENCOUNTER — Ambulatory Visit (INDEPENDENT_AMBULATORY_CARE_PROVIDER_SITE_OTHER): Payer: Medicare Other | Admitting: Pharmacist

## 2011-05-13 DIAGNOSIS — I4891 Unspecified atrial fibrillation: Secondary | ICD-10-CM | POA: Diagnosis not present

## 2011-05-14 DIAGNOSIS — M722 Plantar fascial fibromatosis: Secondary | ICD-10-CM | POA: Diagnosis not present

## 2011-05-14 DIAGNOSIS — M7750 Other enthesopathy of unspecified foot: Secondary | ICD-10-CM | POA: Diagnosis not present

## 2011-06-16 ENCOUNTER — Ambulatory Visit (INDEPENDENT_AMBULATORY_CARE_PROVIDER_SITE_OTHER): Payer: Medicare Other | Admitting: *Deleted

## 2011-06-16 ENCOUNTER — Ambulatory Visit (INDEPENDENT_AMBULATORY_CARE_PROVIDER_SITE_OTHER): Payer: Medicare Other | Admitting: Internal Medicine

## 2011-06-16 ENCOUNTER — Encounter: Payer: Self-pay | Admitting: *Deleted

## 2011-06-16 ENCOUNTER — Encounter: Payer: Self-pay | Admitting: Internal Medicine

## 2011-06-16 ENCOUNTER — Encounter (HOSPITAL_COMMUNITY): Payer: Self-pay | Admitting: Pharmacy Technician

## 2011-06-16 VITALS — BP 94/64 | HR 88 | Ht 67.0 in | Wt 164.8 lb

## 2011-06-16 DIAGNOSIS — Z950 Presence of cardiac pacemaker: Secondary | ICD-10-CM | POA: Diagnosis not present

## 2011-06-16 DIAGNOSIS — I4891 Unspecified atrial fibrillation: Secondary | ICD-10-CM

## 2011-06-16 DIAGNOSIS — I428 Other cardiomyopathies: Secondary | ICD-10-CM

## 2011-06-16 LAB — PACEMAKER DEVICE OBSERVATION
ATRIAL PACING PM: 97
BAMS-0003: 70 {beats}/min
BATTERY VOLTAGE: 2.78 V
BRDY-0003RV: 115 {beats}/min
BRDY-0004RV: 120 {beats}/min
RV LEAD THRESHOLD: 0.75 V
VENTRICULAR PACING PM: 5

## 2011-06-16 LAB — BASIC METABOLIC PANEL
CO2: 31 mEq/L (ref 19–32)
Chloride: 104 mEq/L (ref 96–112)
Creatinine, Ser: 0.9 mg/dL (ref 0.4–1.5)

## 2011-06-16 NOTE — Progress Notes (Signed)
HPI [name] returns today for followup. He is a very pleasant 78 year old man with paroxysmal atrial fibrillation, coronary disease, a nonischemic cardiomyopathy, symptomatic bradycardia, status post permanent pacemaker insertion. The patient notes that he has been out of rhythm for several days. He denies medical noncompliance. Allergies  Allergen Reactions  . Antihistamines, Diphenhydramine-Type Other (See Comments)    Causes difficulty in ability to urinate.     Current Outpatient Prescriptions  Medication Sig Dispense Refill  . Multiple Vitamins-Minerals (MULTIVITAMIN WITH MINERALS) tablet Take 1 tablet by mouth daily.        Marland Kitchen DISCONTD: carvedilol (COREG) 12.5 MG tablet Take 1 tablet (12.5 mg total) by mouth 2 (two) times daily.  180 tablet  3  . DISCONTD: digoxin (LANOXIN) 0.25 MG tablet Take 1 tablet (250 mcg total) by mouth daily.  90 tablet  3  . DISCONTD: dofetilide (TIKOSYN) 500 MCG capsule Take 1 capsule (500 mcg total) by mouth 2 (two) times daily.  180 capsule  3  . DISCONTD: fluticasone (FLONASE) 50 MCG/ACT nasal spray Place 2 sprays into the nose daily.  16 g  6  . DISCONTD: pantoprazole (PROTONIX) 40 MG tablet Take 1 tablet (40 mg total) by mouth daily.  90 tablet  3  . DISCONTD: pravastatin (PRAVACHOL) 80 MG tablet Take 1 tablet (80 mg total) by mouth daily.  90 tablet  3  . DISCONTD: warfarin (COUMADIN) 5 MG tablet Take 1 tablet (5 mg total) by mouth as directed.  90 tablet  3  . carvedilol (COREG) 12.5 MG tablet Take 12.5 mg by mouth 2 (two) times daily.      . digoxin (LANOXIN) 0.25 MG tablet Take 250 mcg by mouth daily.      Marland Kitchen dofetilide (TIKOSYN) 500 MCG capsule Take 500 mcg by mouth 2 (two) times daily.      . finasteride (PROSCAR) 5 MG tablet Take 5 mg by mouth Daily.      . fluticasone (FLONASE) 50 MCG/ACT nasal spray Place 2 sprays into the nose daily.      . magnesium oxide (MAG-OX) 400 MG tablet Take 400 mg by mouth daily.      . pantoprazole (PROTONIX) 40  MG tablet Take 40 mg by mouth daily.      . pravastatin (PRAVACHOL) 80 MG tablet Take 80 mg by mouth daily.      Marland Kitchen warfarin (COUMADIN) 5 MG tablet Take 5-7.5 mg by mouth daily. Take 1 tablet daily, except take 1 tablets on Tuesdays and Saturdays.      Marland Kitchen DISCONTD: dofetilide (TIKOSYN) 500 MCG capsule Take 1 capsule (500 mcg total) by mouth 2 (two) times daily.  120 capsule  0  . DISCONTD: dofetilide (TIKOSYN) 500 MCG capsule Take 500 mcg by mouth 2 (two) times daily.         Past Medical History  Diagnosis Date  . Coronary artery disease   . Cardiomyopathy primary-nonischemic EF 45%  . Hyperlipidemia   . GERD (gastroesophageal reflux disease)   . Polymyalgia rheumatica   . Sick sinus syndrome   . Atrial fibrillation   . Dysphagia   . Hearing loss, mixed, bilateral   . Increased prostate specific antigen (PSA) velocity   . Lumbar back pain   . BPH (benign prostatic hypertrophy)   . Breast mass   . CHF (congestive heart failure)   . Umbilical hernia   . Right inguinal hernia   . Elevated liver enzymes   . Esophageal stricture   . Dyslipidemia  ROS:   All systems reviewed and negative except as noted in the HPI.   Past Surgical History  Procedure Date  . Cataract extraction     left  . Umbilical hernia repair   . Insert / replace / remove pacemaker   . Hernia repair 2012    RIH  . Tonsilectomy, adenoidectomy, bilateral myringotomy and tubes      Family History  Problem Relation Age of Onset  . Heart attack Father 39    deceased  . Aneurysm Mother     deceased AAA  . Hypertension Mother   . Other Brother     deceased at birth     History   Social History  . Marital Status: Married    Spouse Name: N/A    Number of Children: N/A  . Years of Education: N/A   Occupational History  . Not on file.   Social History Main Topics  . Smoking status: Former Smoker    Types: Cigarettes    Quit date: 03/10/1983  . Smokeless tobacco: Never Used  . Alcohol  Use: Yes  . Drug Use: No  . Sexually Active: Not on file   Other Topics Concern  . Not on file   Social History Narrative  . No narrative on file     BP 94/64  Pulse 88  Ht 5\' 7"  (1.702 m)  Wt 74.753 kg (164 lb 12.8 oz)  BMI 25.81 kg/m2  SpO2 96%  Physical Exam:  Well appearing NAD HEENT: Unremarkable Neck:  No JVD, no thyromegally Lungs:  Clear with no wheezes, rales, or rhonchi. HEART:  IRegular rate rhythm, no murmurs, no rubs, no clicks Abd:  soft, positive bowel sounds, no organomegally, no rebound, no guarding Ext:  2 plus pulses, no edema, no cyanosis, no clubbing Skin:  No rashes no nodules Neuro:  CN II through XII intact, motor grossly intact  DEVICE  Normal device function.  See PaceArt for details. Atrial fibrillation is present.   Assess/Plan:

## 2011-06-16 NOTE — Assessment & Plan Note (Signed)
The patient has been in atrial fibrillation for approximately 3 days. He is symptomatic. He has been anticoagulated with Coumadin. We will check his Coumadin level today and if he is therapeutic, he will undergo DC cardioversion in the morning. This will occur at [facility].

## 2011-06-16 NOTE — Patient Instructions (Signed)
Your physician has recommended that you have a Cardioversion (DCCV). Electrical Cardioversion uses a jolt of electricity to your heart either through paddles or wired patches attached to your chest. This is a controlled, usually prescheduled, procedure. Defibrillation is done under light anesthesia in the hospital, and you usually go home the day of the procedure. This is done to get your heart back into a normal rhythm. You are not awake for the procedure. Please see the instruction sheet given to you today.  Your physician recommends that you return for lab work today: BMP

## 2011-06-16 NOTE — Assessment & Plan Note (Signed)
His device is working normally. We'll plan to recheck in several months. 

## 2011-06-17 ENCOUNTER — Ambulatory Visit (HOSPITAL_COMMUNITY): Payer: Medicare Other | Admitting: Anesthesiology

## 2011-06-17 ENCOUNTER — Encounter (HOSPITAL_COMMUNITY): Payer: Self-pay | Admitting: Anesthesiology

## 2011-06-17 ENCOUNTER — Ambulatory Visit (HOSPITAL_COMMUNITY)
Admission: RE | Admit: 2011-06-17 | Discharge: 2011-06-17 | Disposition: A | Discharge status: Home / Self Care | Payer: Medicare Other | Source: Ambulatory Visit | Attending: Cardiology | Admitting: Cardiology

## 2011-06-17 ENCOUNTER — Encounter (HOSPITAL_COMMUNITY)
Admission: RE | Disposition: A | Discharge status: Home / Self Care | Payer: Self-pay | Source: Ambulatory Visit | Attending: Cardiology

## 2011-06-17 DIAGNOSIS — I4891 Unspecified atrial fibrillation: Secondary | ICD-10-CM | POA: Insufficient documentation

## 2011-06-17 DIAGNOSIS — I428 Other cardiomyopathies: Secondary | ICD-10-CM | POA: Diagnosis not present

## 2011-06-17 HISTORY — PX: CARDIOVERSION: SHX1299

## 2011-06-17 LAB — CBC
HCT: 45.2 % (ref 39.0–52.0)
Hemoglobin: 16.4 g/dL (ref 13.0–17.0)
MCH: 30.7 pg (ref 26.0–34.0)
MCHC: 36.3 g/dL — ABNORMAL HIGH (ref 30.0–36.0)
MCV: 84.6 fL (ref 78.0–100.0)
RBC: 5.34 MIL/uL (ref 4.22–5.81)

## 2011-06-17 SURGERY — CARDIOVERSION
Anesthesia: Monitor Anesthesia Care | Wound class: Clean

## 2011-06-17 MED ORDER — SODIUM CHLORIDE 0.9 % IV SOLN: 250.0000 mL | INTRAVENOUS | Status: DC

## 2011-06-17 MED ORDER — PROPOFOL 10 MG/ML IV EMUL
INTRAVENOUS | Status: DC | PRN
  Administered 2011-06-17: 100 mg via INTRAVENOUS

## 2011-06-17 MED ORDER — SODIUM CHLORIDE 0.9 % IJ SOLN: 3.0000 mL | INTRAMUSCULAR | Status: DC | PRN

## 2011-06-17 MED ORDER — SODIUM CHLORIDE 0.9 % IJ SOLN: 3.0000 mL | Freq: Two times a day (BID) | INTRAMUSCULAR | Status: DC

## 2011-06-17 MED ORDER — HYDROCORTISONE 1 % EX CREA
1.0000 "application " | TOPICAL_CREAM | Freq: Three times a day (TID) | CUTANEOUS | Status: DC | PRN
  Filled 2011-06-17: qty 28

## 2011-06-17 MED ORDER — SODIUM CHLORIDE 0.9 % IV SOLN
INTRAVENOUS | Status: DC | PRN
  Administered 2011-06-17: 10:00:00 via INTRAVENOUS

## 2011-06-17 NOTE — Anesthesia Preprocedure Evaluation (Signed)
Anesthesia Evaluation  Patient identified by MRN, date of birth, ID band Patient awake    Reviewed: Allergy & Precautions, H&P , NPO status , Patient's Chart, lab work & pertinent test results, reviewed documented beta blocker date and time   History of Anesthesia Complications (+) DIFFICULT AIRWAY  Airway Mallampati: III TM Distance: <3 FB Neck ROM: full  Mouth opening: Limited Mouth Opening  Dental  (+) Dental Advidsory Given   Pulmonary          Cardiovascular + CAD and +CHF + dysrhythmias Atrial Fibrillation Rhythm:irregular     Neuro/Psych    GI/Hepatic GERD-  Controlled and Medicated,  Endo/Other    Renal/GU      Musculoskeletal   Abdominal   Peds  Hematology   Anesthesia Other Findings   Reproductive/Obstetrics                           Anesthesia Physical Anesthesia Plan  ASA: III  Anesthesia Plan:    Post-op Pain Management:    Induction:   Airway Management Planned:   Additional Equipment:   Intra-op Plan:   Post-operative Plan:   Informed Consent:   Dental Advisory Given  Plan Discussed with: Anesthesiologist, CRNA and Surgeon  Anesthesia Plan Comments:         Anesthesia Quick Evaluation

## 2011-06-17 NOTE — Procedures (Signed)
Electrical Cardioversion Procedure Note [name] [id number] Feb 13, 1930  Procedure: Electrical Cardioversion Indications:  Atrial Fibrillation  Procedure Details Consent: Risks of procedure as well as the alternatives and risks of each were explained to the (patient/caregiver).  Consent for procedure obtained. Time Out: Verified patient identification, verified procedure, site/side was marked, verified correct patient position, special equipment/implants available, medications/allergies/relevent history reviewed, required imaging and test results available.  Performed  Patient placed on cardiac monitor, pulse oximetry, supplemental oxygen as necessary.  Sedation given: Diprovan 100 mg IV administered by anesthesia. Pacer pads placed anterior and posterior chest.  Cardioverted 1 time(s).  Cardioverted at 120J.  Evaluation Findings: Post procedure EKG shows: Atrial ventricular pacing. Complications: None Patient did tolerate procedure well.   [name] 06/17/2011, 11:52 AM

## 2011-06-17 NOTE — Discharge Instructions (Signed)
General Anesthetic, Adult A doctor specialized in giving anesthesia (anesthesiologist) or a nurse specialized in giving anesthesia (nurse anesthetist) gives medicine that makes you sleep while a procedure is performed (general anesthetic). Once the general anesthetic has been administered, you will be in a sleeplike state in which you feel no pain. After having a general anestheticyou may feel:   Dizzy.   Weak.   Drowsy.   Confused.  These feelings are normal and can be expected to last for up to 24 hours after the procedure is completed.  LET YOUR CAREGIVER KNOW ABOUT:  Allergies you have.   Medications you are taking, including herbs, eye drops, over the counter medications, dietary supplements, and creams.   Previous problems you have had with anesthetics or numbing medicines.   Use of cigarettes, alcohol, or illicit drugs.   Possibility of pregnancy, if this applies.   History of bleeding or blood disorders, including blood clots and clotting disorders.   Previous surgeries you have had and types of anesthetics you have received.   Family medical history, especially anesthetic problems.   Other health problems.  BEFORE THE PROCEDURE  You may brush your teeth on the morning of surgery but you should have no solid food or non-clear liquids for a minimum of 8 hours prior to your procedure. Clear liquids (water, black coffee, and tea) are acceptable in small amounts until 2 hours prior to your procedure.   You may take your regular medications the morning of your procedure unless your caregiver indicates otherwise.  AFTER THE PROCEDURE  After surgery, you will be taken to the recovery area where a nurse will monitor your progress. You will be allowed to go home when you are awake, stable, taking fluids well, and without serious pain or complications.   For the first 24 hours following an anesthetic:   Have a responsible person with you.   Do not drive a car. If you are  alone, do not take public transportation.   Do not engage in strenuous activity. You may usually resume normal activities the next day, or as advised by your caregiver.   Do not drink alcohol.   Do not take medicine that has not been prescribed by your caregiver.   Do not sign important papers or make important decisions as your judgement may be impaired.   You may resume a normal diet as directed.   Change bandages (dressings) as directed.   Only take over-the-counter or prescription medicines for pain, discomfort, or fever as directed by your caregiver.  If you have questions or problems that seem related to the anesthetic, call the hospital and ask for the anesthetist, anesthesiologist, or anesthesia department. SEEK IMMEDIATE MEDICAL CARE IF:   You develop a rash.   You have difficulty breathing.   You have chest pain.   You have allergic problems.   You have uncontrolled nausea.   You have uncontrolled vomiting.   You develop any serious bleeding, especially from the incision site.  Document Released: 06/02/2007 Document Revised: 02/12/2011 Document Reviewed: 06/26/2010 [facility] Patient Information 2012 [place], [place].

## 2011-06-17 NOTE — Transfer of Care (Signed)
Immediate Anesthesia Transfer of Care Note  Patient: [name]  Procedure(s) Performed: Procedure(s) (LRB): CARDIOVERSION (N/A)  Patient Location: PACU and Short Stay  Anesthesia Type: MAC  Level of Consciousness: awake, alert  and oriented  Airway & Oxygen Therapy: Patient Spontanous Breathing and Patient connected to nasal cannula oxygen  Post-op Assessment: Report given to PACU RN, Post -op Vital signs reviewed and stable and Patient moving all extremities  Post vital signs: Reviewed and stable  Complications: No apparent anesthesia complications

## 2011-06-17 NOTE — Preoperative (Signed)
Beta Blockers   Reason not to administer Beta Blockers:Not Applicable 

## 2011-06-17 NOTE — Anesthesia Postprocedure Evaluation (Signed)
  Anesthesia Post-op Note  Patient: [name]  Procedure(s) Performed: Procedure(s) (LRB): CARDIOVERSION (N/A)  Patient Location: PACU and Short Stay  Anesthesia Type: MAC  Level of Consciousness: awake, alert  and oriented  Airway and Oxygen Therapy: Patient Spontanous Breathing and Patient connected to nasal cannula oxygen  Post-op Pain: none  Post-op Assessment: Post-op Vital signs reviewed, Patient's Cardiovascular Status Stable, Respiratory Function Stable, Patent Airway and No signs of Nausea or vomiting  Post-op Vital Signs: Reviewed and stable  Complications: No apparent anesthesia complications

## 2011-06-18 ENCOUNTER — Encounter (HOSPITAL_COMMUNITY): Payer: Self-pay | Admitting: Cardiology

## 2011-06-24 ENCOUNTER — Ambulatory Visit (INDEPENDENT_AMBULATORY_CARE_PROVIDER_SITE_OTHER): Payer: Medicare Other | Admitting: *Deleted

## 2011-06-24 ENCOUNTER — Ambulatory Visit (INDEPENDENT_AMBULATORY_CARE_PROVIDER_SITE_OTHER): Payer: Medicare Other | Admitting: Cardiology

## 2011-06-24 ENCOUNTER — Encounter: Payer: Self-pay | Admitting: Cardiology

## 2011-06-24 VITALS — BP 125/80 | HR 69 | Ht 68.0 in | Wt 161.0 lb

## 2011-06-24 DIAGNOSIS — E785 Hyperlipidemia, unspecified: Secondary | ICD-10-CM

## 2011-06-24 DIAGNOSIS — E7800 Pure hypercholesterolemia, unspecified: Secondary | ICD-10-CM

## 2011-06-24 DIAGNOSIS — I2510 Atherosclerotic heart disease of native coronary artery without angina pectoris: Secondary | ICD-10-CM

## 2011-06-24 DIAGNOSIS — I4891 Unspecified atrial fibrillation: Secondary | ICD-10-CM

## 2011-06-24 LAB — POCT INR: INR: 2.4

## 2011-06-24 NOTE — Patient Instructions (Signed)
Your physician recommends that you schedule a follow-up appointment in: 4 MONTHS with Dr [name]  Your physician recommends that you return for a FASTING LIPID and LIVER Profile--nothing to eat or drink after midnight.  Your physician recommends that you continue on your current medications as directed. Please refer to the Current Medication list given to you today.

## 2011-06-24 NOTE — Assessment & Plan Note (Signed)
Stable.  Just converted.  Followed by Dr. Ladona Ridgel for this.  Back in NSR.  Last event about a year ago.

## 2011-06-24 NOTE — Assessment & Plan Note (Signed)
Stable at present.  Due now for lipid and liver, and we will obtain.  Recent BMET done by Dr. Ladona Ridgel.

## 2011-06-24 NOTE — Progress Notes (Signed)
HPI:  Seen in follow up.  Underwent cardioversion.  Saw GT after going in to af.  Cardioverted and feels great.  Early March had a church event. Got very, very tired.  Then set out for [place].  For the first week was very tired, and noticed hard pounding at night, and some discomfort around left arm.  Now much better, exercising, and doing really well.  No progressive symptoms.    Current Outpatient Prescriptions  Medication Sig Dispense Refill  . carvedilol (COREG) 12.5 MG tablet Take 12.5 mg by mouth 2 (two) times daily.      . digoxin (LANOXIN) 0.25 MG tablet Take 250 mcg by mouth daily.      Marland Kitchen dofetilide (TIKOSYN) 500 MCG capsule Take 500 mcg by mouth 2 (two) times daily.      . finasteride (PROSCAR) 5 MG tablet Take 5 mg by mouth. Taking 4 times per Week      . fluticasone (FLONASE) 50 MCG/ACT nasal spray Place 2 sprays into the nose daily.      . magnesium oxide (MAG-OX) 400 MG tablet Take 400 mg by mouth daily.      . Multiple Vitamins-Minerals (MULTIVITAMIN WITH MINERALS) tablet Take 1 tablet by mouth daily.        . pantoprazole (PROTONIX) 40 MG tablet Take 40 mg by mouth. Taking 4 times per week      . pravastatin (PRAVACHOL) 80 MG tablet Take 80 mg by mouth daily.      Marland Kitchen warfarin (COUMADIN) 5 MG tablet Take 5-7.5 mg by mouth daily. Take 1 tablet daily, except take 1 tablets on Tuesdays and Saturdays.        Allergies  Allergen Reactions  . Antihistamines, Diphenhydramine-Type Other (See Comments)    Causes difficulty in ability to urinate.    Past Medical History  Diagnosis Date  . Coronary artery disease   . Cardiomyopathy primary-nonischemic EF 45%  . Hyperlipidemia   . GERD (gastroesophageal reflux disease)   . Polymyalgia rheumatica   . Sick sinus syndrome   . Atrial fibrillation   . Dysphagia   . Hearing loss, mixed, bilateral   . Increased prostate specific antigen (PSA) velocity   . Lumbar back pain   . BPH (benign prostatic hypertrophy)   . Breast mass   .  CHF (congestive heart failure)   . Umbilical hernia   . Right inguinal hernia   . Elevated liver enzymes   . Esophageal stricture   . Dyslipidemia     Past Surgical History  Procedure Date  . Cataract extraction     left  . Umbilical hernia repair   . Insert / replace / remove pacemaker   . Hernia repair 2012    RIH  . Tonsilectomy, adenoidectomy, bilateral myringotomy and tubes   . Cardioversion 06/17/2011    Procedure: CARDIOVERSION;  Surgeon: [name], MD;  Location: [facility] OR;  Service: Cardiovascular;  Laterality: N/A;    Family History  Problem Relation Age of Onset  . Heart attack Father 36    deceased  . Aneurysm Mother     deceased AAA  . Hypertension Mother   . Other Brother     deceased at birth    History   Social History  . Marital Status: Married    Spouse Name: N/A    Number of Children: N/A  . Years of Education: N/A   Occupational History  . Not on file.   Social History Main Topics  .  Smoking status: Former Smoker    Types: Cigarettes    Quit date: 03/10/1983  . Smokeless tobacco: Never Used  . Alcohol Use: Yes  . Drug Use: No  . Sexually Active: Not on file   Other Topics Concern  . Not on file   Social History Narrative  . No narrative on file    ROS: Please see the HPI.  All other systems reviewed and negative.  PHYSICAL EXAM:  BP 125/80  Pulse 69  Ht 5\' 8"  (1.727 m)  Wt 161 lb (73.029 kg)  BMI 24.48 kg/m2  General: Well developed, well nourished, in no acute distress. Head:  Normocephalic and atraumatic. Neck: no JVD.  Pacer site ok.   Lungs: Clear to auscultation and percussion. Heart: Normal S1 and S2.  No murmur, rubs or gallops.  Abdomen:  Normal bowel sounds; soft; non tender; no organomegaly Pulses: Pulses normal in all 4 extremities. Extremities: No clubbing or cyanosis. No edema. Neurologic: Alert and oriented x 3.  EKG:  Atrial pacing, ventricular pacing.    ASSESSMENT AND PLAN:

## 2011-06-24 NOTE — Assessment & Plan Note (Signed)
Not sure if recent symptoms in [place] were just fatigue, or mild ischemia.  He has had no more.  He knows to call us if it gets any worse.  No current chest pain.

## 2011-06-29 ENCOUNTER — Other Ambulatory Visit: Payer: Medicare Other

## 2011-08-10 ENCOUNTER — Ambulatory Visit: Payer: Medicare Other | Admitting: Internal Medicine

## 2011-08-26 ENCOUNTER — Ambulatory Visit: Payer: Medicare Other | Admitting: Internal Medicine

## 2011-08-30 DIAGNOSIS — IMO0002 Reserved for concepts with insufficient information to code with codable children: Secondary | ICD-10-CM | POA: Diagnosis not present

## 2011-08-30 DIAGNOSIS — S91109A Unspecified open wound of unspecified toe(s) without damage to nail, initial encounter: Secondary | ICD-10-CM | POA: Diagnosis not present

## 2011-09-07 ENCOUNTER — Other Ambulatory Visit (INDEPENDENT_AMBULATORY_CARE_PROVIDER_SITE_OTHER): Payer: Medicare Other

## 2011-09-07 ENCOUNTER — Telehealth: Payer: Self-pay

## 2011-09-07 DIAGNOSIS — I4891 Unspecified atrial fibrillation: Secondary | ICD-10-CM | POA: Diagnosis not present

## 2011-09-07 DIAGNOSIS — E7800 Pure hypercholesterolemia, unspecified: Secondary | ICD-10-CM

## 2011-09-07 LAB — LIPID PANEL
HDL: 38.4 mg/dL — ABNORMAL LOW (ref >39.00)
LDL Cholesterol: 65 mg/dL (ref 0–99)
Total CHOL/HDL Ratio: 3
VLDL: 25.4 mg/dL (ref 0.0–40.0)

## 2011-09-07 LAB — HEPATIC FUNCTION PANEL
ALT: 30 U/L (ref 0–53)
Bilirubin, Direct: 0.1 mg/dL (ref 0.0–0.3)
Total Bilirubin: 0.7 mg/dL (ref 0.3–1.2)

## 2011-09-07 NOTE — Telephone Encounter (Signed)
New problem:  Patient calling C/O Afib for 11 days now.  No er visit , no pcp. Was contact .  Would like to be set up for cardioversion.  Spoke with Elnita Maxwell aware message will be sent to Triage nurse.

## 2011-09-07 NOTE — Telephone Encounter (Signed)
Will forward to Lauren RN with Dr [name]

## 2011-09-07 NOTE — Telephone Encounter (Signed)
I spoke with the pt and he has been in AFib for 11 days.  The pt states that he has felt okay but today developed SOB when trying to exercise.  I reviewed the pt's chart and the last INR on file is April.  The pt will come into the office tomorrow for an INR and appointment with Dr [name].

## 2011-09-08 ENCOUNTER — Ambulatory Visit (INDEPENDENT_AMBULATORY_CARE_PROVIDER_SITE_OTHER): Payer: Medicare Other | Admitting: Cardiology

## 2011-09-08 ENCOUNTER — Encounter: Payer: Self-pay | Admitting: Cardiology

## 2011-09-08 ENCOUNTER — Ambulatory Visit (INDEPENDENT_AMBULATORY_CARE_PROVIDER_SITE_OTHER): Payer: Medicare Other | Admitting: *Deleted

## 2011-09-08 ENCOUNTER — Ambulatory Visit: Payer: Medicare Other

## 2011-09-08 VITALS — BP 110/62 | HR 72 | Ht 68.0 in | Wt 171.0 lb

## 2011-09-08 DIAGNOSIS — I428 Other cardiomyopathies: Secondary | ICD-10-CM

## 2011-09-08 DIAGNOSIS — E785 Hyperlipidemia, unspecified: Secondary | ICD-10-CM

## 2011-09-08 DIAGNOSIS — I4891 Unspecified atrial fibrillation: Secondary | ICD-10-CM

## 2011-09-08 LAB — CBC WITH DIFFERENTIAL/PLATELET
Basophils Relative: 0.5 % (ref 0.0–3.0)
Eosinophils Relative: 2.2 % (ref 0.0–5.0)
Lymphocytes Relative: 29.8 % (ref 12.0–46.0)
MCV: 91.9 fl (ref 78.0–100.0)
Monocytes Absolute: 0.8 10*3/uL (ref 0.1–1.0)
Monocytes Relative: 9.4 % (ref 3.0–12.0)
Neutrophils Relative %: 58.1 % (ref 43.0–77.0)
RBC: 4.81 Mil/uL (ref 4.22–5.81)
WBC: 9 10*3/uL (ref 4.5–10.5)

## 2011-09-08 LAB — BASIC METABOLIC PANEL
Chloride: 105 mEq/L (ref 96–112)
Creatinine, Ser: 1 mg/dL (ref 0.4–1.5)

## 2011-09-08 LAB — T4, FREE: Free T4: 0.88 ng/dL (ref 0.60–1.60)

## 2011-09-08 LAB — POCT INR: INR: 2.3

## 2011-09-08 MED ORDER — SODIUM CHLORIDE 0.9 % IJ SOLN: 3.0000 mL | INTRAMUSCULAR | Status: DC | PRN

## 2011-09-08 MED ORDER — SODIUM CHLORIDE 0.9 % IJ SOLN: 3.0000 mL | Freq: Two times a day (BID) | INTRAMUSCULAR | Status: DC

## 2011-09-08 MED ORDER — HYDROCORTISONE 1 % EX CREA: 1.0000 "application " | TOPICAL_CREAM | Freq: Three times a day (TID) | CUTANEOUS | Status: DC | PRN

## 2011-09-08 MED ORDER — SODIUM CHLORIDE 0.9 % IV SOLN: 250.0000 mL | INTRAVENOUS | Status: DC

## 2011-09-08 NOTE — Progress Notes (Signed)
HPI:  Patient is here for followup. He was out on the schedule. He was at the beach last week, and notably when out of rhythm.  He has been asymptomatic since that time, and he was brought into the office today to be seen. He denies any chest pain. Patient has a history of recurrent atrial fibrillation requiring cardioversion. His INR is been therapeutic. The patient takes decreasing. He is also on daily Lanoxin as well as carvedilol. He has an underlying pacemaker in place as well. His been modestly short of breath when he exercises, and he knows when he is out of rhythm. He was out of rhythm a couple of  months ago.  Current Outpatient Prescriptions  Medication Sig Dispense Refill  . carvedilol (COREG) 12.5 MG tablet Take 12.5 mg by mouth 2 (two) times daily.      . digoxin (LANOXIN) 0.25 MG tablet Take 250 mcg by mouth daily.      Marland Kitchen dofetilide (TIKOSYN) 500 MCG capsule Take 500 mcg by mouth 2 (two) times daily.      . finasteride (PROSCAR) 5 MG tablet Take 5 mg by mouth. Taking 4 times per Week      . fluticasone (FLONASE) 50 MCG/ACT nasal spray Place 2 sprays into the nose daily.      . magnesium oxide (MAG-OX) 400 MG tablet Take 400 mg by mouth daily.      . Multiple Vitamins-Minerals (MULTIVITAMIN WITH MINERALS) tablet Take 1 tablet by mouth daily.        . pantoprazole (PROTONIX) 40 MG tablet Take 40 mg by mouth daily.       . pravastatin (PRAVACHOL) 80 MG tablet Take 80 mg by mouth daily.      Marland Kitchen warfarin (COUMADIN) 5 MG tablet Take 5-7.5 mg by mouth daily. Take 1 tablet daily, except take 1 tablets on Tuesdays and Saturdays.        Allergies  Allergen Reactions  . Antihistamines, Diphenhydramine-Type Other (See Comments)    Causes difficulty in ability to urinate.    Past Medical History  Diagnosis Date  . Coronary artery disease   . Cardiomyopathy primary-nonischemic EF 45%  . Hyperlipidemia   . GERD (gastroesophageal reflux disease)   . Polymyalgia rheumatica   . Sick  sinus syndrome   . Atrial fibrillation   . Dysphagia   . Hearing loss, mixed, bilateral   . Increased prostate specific antigen (PSA) velocity   . Lumbar back pain   . BPH (benign prostatic hypertrophy)   . Breast mass   . CHF (congestive heart failure)   . Umbilical hernia   . Right inguinal hernia   . Elevated liver enzymes   . Esophageal stricture   . Dyslipidemia     Past Surgical History  Procedure Date  . Cataract extraction     left  . Umbilical hernia repair   . Insert / replace / remove pacemaker   . Hernia repair 2012    RIH  . Tonsilectomy, adenoidectomy, bilateral myringotomy and tubes   . Cardioversion 06/17/2011    Procedure: CARDIOVERSION;  Surgeon: [name], MD;  Location: [facility] OR;  Service: Cardiovascular;  Laterality: N/A;    Family History  Problem Relation Age of Onset  . Heart attack Father 60    deceased  . Aneurysm Mother     deceased AAA  . Hypertension Mother   . Other Brother     deceased at birth    History   Social History  .  Marital Status: Married    Spouse Name: N/A    Number of Children: N/A  . Years of Education: N/A   Occupational History  . Not on file.   Social History Main Topics  . Smoking status: Former Smoker    Types: Cigarettes    Quit date: 03/10/1983  . Smokeless tobacco: Never Used  . Alcohol Use: Yes  . Drug Use: No  . Sexually Active: Not on file   Other Topics Concern  . Not on file   Social History Narrative  . No narrative on file    ROS: Please see the HPI.  All other systems reviewed and negative.  PHYSICAL EXAM:  BP 110/62  Pulse 72  Ht 5\' 8"  (1.727 m)  Wt 171 lb (77.565 kg)  BMI 26.00 kg/m2  General: Well developed, well nourished, in no acute distress. Head:  Normocephalic and atraumatic. Neck: no JVD Lungs: Clear to auscultation and percussion. Heart: Normal S1 and S2. Slightly irregular.   Abdomen:  Normal bowel sounds; soft; non tender; no organomegaly Pulses: Pulses  normal in all 4 extremities. Extremities: No clubbing or cyanosis. No edema. Neurologic: Alert and oriented x 3.  EKG:  Atrial fib with v pacing, and IVCD when overriding.    ASSESSMENT AND PLAN:

## 2011-09-08 NOTE — Patient Instructions (Addendum)
Your physician has recommended that you have a Cardioversion (DCCV). Electrical Cardioversion uses a jolt of electricity to your heart either through paddles or wired patches attached to your chest. This is a controlled, usually prescheduled, procedure. Defibrillation is done under light anesthesia in the hospital, and you usually go home the day of the procedure. This is done to get your heart back into a normal rhythm. You are not awake for the procedure. Please see the instruction sheet given to you today.  Your physician recommends that you continue on your current medications as directed. Please refer to the Current Medication list given to you today.  Your physician recommends that you keep your scheduled follow-up appointment on October 28, 2011 at 8:45 with Dr [name].  Your physician recommends that you have lab work today: BMP, CBC, TSH and Free T4

## 2011-09-08 NOTE — Assessment & Plan Note (Signed)
His LDL is at target, and the best he is seen in the past. We will continue current statin therapy.

## 2011-09-08 NOTE — Assessment & Plan Note (Signed)
The patient has recurrent atrial fibrillation. He is very familiar with the procedure of cardioversion. His INR has been therapeutic. He would like to get this taken care of soon as possible we will try to make the arrangements for tomorrow. I will be out-of-town, one of my colleagues will perform the procedure.  He understands the risks benefits.

## 2011-09-08 NOTE — Assessment & Plan Note (Signed)
This does get slightly worse when he is out of rhythm, so therefore it is important to restore normal sinus rhythm. He is aware that.

## 2011-09-09 ENCOUNTER — Encounter (HOSPITAL_COMMUNITY): Payer: Self-pay | Admitting: Certified Registered"

## 2011-09-09 ENCOUNTER — Encounter (HOSPITAL_COMMUNITY)
Admission: RE | Disposition: A | Discharge status: Home / Self Care | Payer: Self-pay | Source: Ambulatory Visit | Attending: Cardiology

## 2011-09-09 ENCOUNTER — Ambulatory Visit (HOSPITAL_COMMUNITY): Payer: Medicare Other | Admitting: Certified Registered"

## 2011-09-09 ENCOUNTER — Ambulatory Visit (HOSPITAL_COMMUNITY)
Admission: RE | Admit: 2011-09-09 | Discharge: 2011-09-09 | Disposition: A | Discharge status: Home / Self Care | Payer: Medicare Other | Source: Ambulatory Visit | Attending: Cardiology | Admitting: Cardiology

## 2011-09-09 DIAGNOSIS — I4891 Unspecified atrial fibrillation: Secondary | ICD-10-CM | POA: Diagnosis not present

## 2011-09-09 HISTORY — PX: CARDIOVERSION: SHX1299

## 2011-09-09 SURGERY — CARDIOVERSION
Anesthesia: General | Wound class: Clean

## 2011-09-09 MED ORDER — HYDROCORTISONE 1 % EX CREA: 1.0000 "application " | TOPICAL_CREAM | Freq: Three times a day (TID) | CUTANEOUS | Status: DC | PRN

## 2011-09-09 MED ORDER — SODIUM CHLORIDE 0.9 % IV SOLN: INTRAVENOUS | Status: DC

## 2011-09-09 MED ORDER — PROPOFOL 10 MG/ML IV EMUL
INTRAVENOUS | Status: DC | PRN
  Administered 2011-09-09: 50 mg via INTRAVENOUS

## 2011-09-09 MED ORDER — SODIUM CHLORIDE 0.9 % IV SOLN
INTRAVENOUS | Status: DC | PRN
  Administered 2011-09-09: 11:00:00 via INTRAVENOUS

## 2011-09-09 NOTE — Preoperative (Signed)
Beta Blockers   Reason not to administer Beta Blockers:took beta blocker this am 

## 2011-09-09 NOTE — Transfer of Care (Signed)
Immediate Anesthesia Transfer of Care Note  Patient: [name]  Procedure(s) Performed: Procedure(s) (LRB): CARDIOVERSION (N/A)  Patient Location: PACU  Anesthesia Type: General  Level of Consciousness: awake, oriented and patient cooperative  Airway & Oxygen Therapy: Patient Spontanous Breathing  Post-op Assessment: Post -op Vital signs reviewed and stable and Patient moving all extremities  Post vital signs: Reviewed and stable  Complications: No apparent anesthesia complications

## 2011-09-09 NOTE — H&P (Signed)
    The patient is here today for cardioversion. There is a complete office note dated 09/08/2011 by Dr. [name] that is in this record. The patient is fully antocoagulated. He has had this procedure before.  The physical exam is unchanged from 09/08/2011.  Labs are stable. INR is 2.3  Jerral Bonito, MD

## 2011-09-09 NOTE — Anesthesia Preprocedure Evaluation (Addendum)
Anesthesia Evaluation  Patient identified by MRN, date of birth, ID band Patient awake    Reviewed: Allergy & Precautions, H&P , NPO status , Patient's Chart, lab work & pertinent test results  Airway Mallampati: II      Dental   Pulmonary neg pulmonary ROS,  breath sounds clear to auscultation        Cardiovascular + CAD and +CHF + dysrhythmias Atrial Fibrillation Rhythm:Regular Rate:Normal     Neuro/Psych    GI/Hepatic GERD-  Controlled,  Endo/Other  negative endocrine ROS  Renal/GU negative Renal ROS     Musculoskeletal   Abdominal   Peds  Hematology negative hematology ROS (+)   Anesthesia Other Findings   Reproductive/Obstetrics                          Anesthesia Physical Anesthesia Plan  ASA: III  Anesthesia Plan: MAC   Post-op Pain Management:    Induction: Intravenous  Airway Management Planned: Mask  Additional Equipment:   Intra-op Plan:   Post-operative Plan:   Informed Consent: I have reviewed the patients History and Physical, chart, labs and discussed the procedure including the risks, benefits and alternatives for the proposed anesthesia with the patient or authorized representative who has indicated his/her understanding and acceptance.   Dental advisory given  Plan Discussed with: CRNA and Surgeon  Anesthesia Plan Comments:         Anesthesia Quick Evaluation

## 2011-09-09 NOTE — CV Procedure (Signed)
The patient signed the appropriate consent. He is ready for cardioversion. He has atrial fibrillation. His INR is 2.3 and we know he is been fully anticoagulated for many weeks. The pacemaker representative is here.  The patient received IV anesthesia with 50 mg of propofol given by the anesthesia team. Anterior posterior pads were in place. Patient received 120 J of biphasic energy. He converted to sinus rhythm.. This was documented through pacemaker interrogation. I spoke with his wife.  There were no complications. He was shocked one time. He converted to sinus rhythm. He is stable and he will be allowed to go home after he is watched post procedure. He knows to followup with Dr. [name].

## 2011-09-09 NOTE — Anesthesia Procedure Notes (Signed)
Procedure Name: MAC Date/Time: 09/09/2011 11:00 AM Performed by: Sherie Don Pre-anesthesia Checklist: Patient identified, Emergency Drugs available, Suction available, Patient being monitored and Timeout performed Patient Re-evaluated:Patient Re-evaluated prior to inductionOxygen Delivery Method: Ambu bag Preoxygenation: Pre-oxygenation with 100% oxygen Intubation Type: IV induction

## 2011-09-09 NOTE — Progress Notes (Signed)
BROOKE,PA NOTIFIED THAT CLIENT C/O RIGHT UPPER CHEST PACEMAKER SITE TENDER AND SWOLLEN FOR PAST COUPLE OF DAYS AND BROOKE IN TO SEE CLIENT AND SHE WILL HAVE CLIENT BE SEEN IN DEVICE CLINIC WHEN CLIENT GOES TO SEE DR [name]

## 2011-09-10 NOTE — Anesthesia Postprocedure Evaluation (Signed)
  Anesthesia Post-op Note  Patient: [name]  Procedure(s) Performed: Procedure(s) (LRB): CARDIOVERSION (N/A)  Patient Location: PACU and Short Stay  Anesthesia Type: MAC  Level of Consciousness: awake  Airway and Oxygen Therapy: Patient Spontanous Breathing  Post-op Pain: mild  Post-op Assessment: Post-op Vital signs reviewed  Post-op Vital Signs: Reviewed  Complications: No apparent anesthesia complications

## 2011-09-11 ENCOUNTER — Encounter (HOSPITAL_COMMUNITY): Payer: Self-pay | Admitting: Cardiology

## 2011-09-14 ENCOUNTER — Telehealth: Payer: Self-pay | Admitting: Internal Medicine

## 2011-09-14 NOTE — Telephone Encounter (Signed)
Please return call to patient at [phone]  Patient had cardioversion on 7/3, c/o swelling and soreness. Please return call to discuss
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
## 2011-09-15 NOTE — Telephone Encounter (Signed)
Will send to scheduler to have patient come in before his coumadin visit  7/10 @ 8:30 to check his site and device.

## 2011-09-16 ENCOUNTER — Ambulatory Visit (INDEPENDENT_AMBULATORY_CARE_PROVIDER_SITE_OTHER): Payer: Medicare Other | Admitting: *Deleted

## 2011-09-16 ENCOUNTER — Ambulatory Visit (INDEPENDENT_AMBULATORY_CARE_PROVIDER_SITE_OTHER): Payer: Medicare Other | Admitting: Internal Medicine

## 2011-09-16 ENCOUNTER — Encounter: Payer: Self-pay | Admitting: Internal Medicine

## 2011-09-16 VITALS — BP 118/64 | HR 77 | Ht 68.0 in | Wt 170.0 lb

## 2011-09-16 DIAGNOSIS — T827XXA Infection and inflammatory reaction due to other cardiac and vascular devices, implants and grafts, initial encounter: Secondary | ICD-10-CM | POA: Diagnosis not present

## 2011-09-16 DIAGNOSIS — I4891 Unspecified atrial fibrillation: Secondary | ICD-10-CM | POA: Diagnosis not present

## 2011-09-16 LAB — CBC WITH DIFFERENTIAL/PLATELET
Basophils Relative: 0.5 % (ref 0.0–3.0)
Eosinophils Absolute: 0.2 10*3/uL (ref 0.0–0.7)
Eosinophils Relative: 2.3 % (ref 0.0–5.0)
Lymphocytes Relative: 29.4 % (ref 12.0–46.0)
MCHC: 33.4 g/dL (ref 30.0–36.0)
MCV: 91.1 fl (ref 78.0–100.0)
Monocytes Absolute: 1 10*3/uL (ref 0.1–1.0)
Neutrophils Relative %: 56 % (ref 43.0–77.0)
Platelets: 230 10*3/uL (ref 150.0–400.0)
RBC: 5.13 Mil/uL (ref 4.22–5.81)
WBC: 8.2 10*3/uL (ref 4.5–10.5)

## 2011-09-16 LAB — SEDIMENTATION RATE: Sed Rate: 8 mm/hr (ref 0–22)

## 2011-09-16 NOTE — Assessment & Plan Note (Signed)
He has painless swelling of his pacemaker pocket. There is no significant trauma. This could be bleeding although his INR is therapeutic and I don't know what I asked for some years of the bleeding pocket. It was more strongly think that this is infection. We will check a sedimentation rate/CRP today as well as a CBC. Over a pressure dressing on although as noted I'm not sanguine that this will have any impact. Will have him see Dr. Ladona Ridgel for review again next week.

## 2011-09-16 NOTE — Progress Notes (Signed)
HPI  [name] is a 78 y.o. male Seen isn't home today because of swelling over his device site.  He has a history of nonischemic cardiomyopathy symptomatic bradycardia atrial fibrillation status post pacemaker implantation  His pacemaker was originally implanted in 1998 and the device generator replacement sometime in the last 5 -8 years  he is not clear in the medical record is not clear other 2 incisions over his pacemaker site.  He noted that there is swelling began prior to his "trauma" at the beach. It has been progressive. There has been no fever or chills. Signed his INR has been therapeutic most recently today 2.3  Past Medical History  Diagnosis Date  . Coronary artery disease   . Cardiomyopathy primary-nonischemic EF 45%  . Hyperlipidemia   . GERD (gastroesophageal reflux disease)   . Polymyalgia rheumatica   . Sick sinus syndrome   . Atrial fibrillation   . Dysphagia   . Hearing loss, mixed, bilateral   . Increased prostate specific antigen (PSA) velocity   . Lumbar back pain   . BPH (benign prostatic hypertrophy)   . Breast mass   . CHF (congestive heart failure)   . Umbilical hernia   . Right inguinal hernia   . Elevated liver enzymes   . Esophageal stricture   . Dyslipidemia     Past Surgical History  Procedure Date  . Cataract extraction     left  . Umbilical hernia repair   . Insert / replace / remove pacemaker   . Hernia repair 2012    RIH  . Tonsilectomy, adenoidectomy, bilateral myringotomy and tubes   . Cardioversion 06/17/2011    Procedure: CARDIOVERSION;  Surgeon: [name], MD;  Location: [facility] OR;  Service: Cardiovascular;  Laterality: N/A;  . Cardioversion 09/09/2011    Procedure: CARDIOVERSION;  Surgeon: Luis Abed, MD;  Location: [facility] OR;  Service: Cardiovascular;  Laterality: N/A;    Current Outpatient Prescriptions  Medication Sig Dispense Refill  . carvedilol (COREG) 12.5 MG tablet Take 12.5 mg by mouth 2 (two) times  daily.      . digoxin (LANOXIN) 0.25 MG tablet Take 250 mcg by mouth daily.      Marland Kitchen dofetilide (TIKOSYN) 500 MCG capsule Take 500 mcg by mouth 2 (two) times daily.      . finasteride (PROSCAR) 5 MG tablet Take 5 mg by mouth daily.       . fluticasone (FLONASE) 50 MCG/ACT nasal spray Place 2 sprays into the nose daily.      . magnesium oxide (MAG-OX) 400 MG tablet Take 400 mg by mouth daily.      . Multiple Vitamins-Minerals (MULTIVITAMIN WITH MINERALS) tablet Take 1 tablet by mouth daily.        . pantoprazole (PROTONIX) 40 MG tablet Take 40 mg by mouth daily.       . pravastatin (PRAVACHOL) 80 MG tablet Take 80 mg by mouth daily.      Marland Kitchen warfarin (COUMADIN) 5 MG tablet Take 5-7.5 mg by mouth daily. Take 1 tablet daily, except take 1 tablets on Tuesdays and Saturdays.       Current Facility-Administered Medications  Medication Dose Route Frequency Provider Last Rate Last Dose  . 0.9 %  sodium chloride infusion  250 mL Intravenous Continuous Herby Abraham, MD      . hydrocortisone cream 1 % 1 application  1 application Topical TID PRN Herby Abraham, MD      . sodium chloride  0.9 % injection 3 mL  3 mL Intravenous Q12H Herby Abraham, MD      . sodium chloride 0.9 % injection 3 mL  3 mL Intravenous PRN Herby Abraham, MD        Allergies  Allergen Reactions  . Antihistamines, Diphenhydramine-Type Other (See Comments)    Causes difficulty in ability to urinate.    Review of Systems negative except from HPI and PMH  Physical Exam BP 118/64  Pulse 77  Ht 5\' 8"  (1.727 m)  Wt 170 lb (77.111 kg)  BMI 25.85 kg/m2  SpO2 96% Well developed and well nourished in no acute distress HENT normal E scleral and icterus clear Neck Supple  Clear to ausculation There is discrete swelling over his pacemaker pocket. There is no erythema or warmth. Regular rate and rhythm, no murmurs gallops or rub Soft with active bowel sounds No clubbing cyanosis none Edema Alert and oriented, grossly  normal motor and sensory function Skin Warm and Dry    Assessment and  Plan

## 2011-09-16 NOTE — Patient Instructions (Addendum)
Your physician recommends that you have lab work today: cbc/sed rate  Your physician recommends that you schedule a follow-up appointment in: 1 week with Dr. Ladona Ridgel- Tuesday 09/22/11 at 12:30 pm.

## 2011-09-22 ENCOUNTER — Encounter: Payer: Medicare Other | Admitting: Internal Medicine

## 2011-09-22 ENCOUNTER — Ambulatory Visit (INDEPENDENT_AMBULATORY_CARE_PROVIDER_SITE_OTHER): Payer: Medicare Other | Admitting: Internal Medicine

## 2011-09-22 ENCOUNTER — Encounter: Payer: Self-pay | Admitting: Internal Medicine

## 2011-09-22 VITALS — BP 154/96 | HR 64 | Ht 67.0 in | Wt 169.0 lb

## 2011-09-22 DIAGNOSIS — T827XXA Infection and inflammatory reaction due to other cardiac and vascular devices, implants and grafts, initial encounter: Secondary | ICD-10-CM | POA: Diagnosis not present

## 2011-09-22 NOTE — Patient Instructions (Signed)
Your physician wants you to follow-up in: 06/2010 with Dr Court Joy will receive a reminder letter in the mail two months in advance. If you don't receive a letter, please call our office to schedule the follow-up appointment.

## 2011-09-22 NOTE — Progress Notes (Signed)
HPI [name] returns today for followup. He had seen Dr. Graciela Husbands several weeks ago with swelling at his PPM pocket. He is 6 yrs out from PPM gen change. He denies fever/chills/night sweats/weight loss or any other symptoms of infection. He did have some trauma to the area. He had a pressure dressing placed and returns for followup. The swelling is resolved and he feels well. Allergies  Allergen Reactions  . Antihistamines, Diphenhydramine-Type Other (See Comments)    Causes difficulty in ability to urinate.     Current Outpatient Prescriptions  Medication Sig Dispense Refill  . carvedilol (COREG) 12.5 MG tablet Take 12.5 mg by mouth 2 (two) times daily.      . digoxin (LANOXIN) 0.25 MG tablet Take 250 mcg by mouth daily.      Marland Kitchen dofetilide (TIKOSYN) 500 MCG capsule Take 500 mcg by mouth 2 (two) times daily.      . finasteride (PROSCAR) 5 MG tablet 4 days a week      . fluticasone (FLONASE) 50 MCG/ACT nasal spray Place 2 sprays into the nose daily.      . magnesium oxide (MAG-OX) 400 MG tablet Take 400 mg by mouth daily.      . Multiple Vitamins-Minerals (MULTIVITAMIN WITH MINERALS) tablet Take 1 tablet by mouth daily.        . pantoprazole (PROTONIX) 40 MG tablet Take 40 mg by mouth daily.       . pravastatin (PRAVACHOL) 80 MG tablet Take 80 mg by mouth daily.      Marland Kitchen warfarin (COUMADIN) 5 MG tablet Take 5-7.5 mg by mouth daily. Take 1 tablet daily, except take 1 tablets on Tuesdays and Saturdays.       Current Facility-Administered Medications  Medication Dose Route Frequency Provider Last Rate Last Dose  . 0.9 %  sodium chloride infusion  250 mL Intravenous Continuous Herby Abraham, MD      . hydrocortisone cream 1 % 1 application  1 application Topical TID PRN Herby Abraham, MD      . sodium chloride 0.9 % injection 3 mL  3 mL Intravenous Q12H Herby Abraham, MD      . sodium chloride 0.9 % injection 3 mL  3 mL Intravenous PRN Herby Abraham, MD         Past Medical  History  Diagnosis Date  . Coronary artery disease   . Cardiomyopathy primary-nonischemic EF 45%  . Hyperlipidemia   . GERD (gastroesophageal reflux disease)   . Polymyalgia rheumatica   . Sick sinus syndrome   . Atrial fibrillation   . Dysphagia   . Hearing loss, mixed, bilateral   . Increased prostate specific antigen (PSA) velocity   . Lumbar back pain   . BPH (benign prostatic hypertrophy)   . Breast mass   . CHF (congestive heart failure)   . Umbilical hernia   . Right inguinal hernia   . Elevated liver enzymes   . Esophageal stricture   . Dyslipidemia     ROS:   All systems reviewed and negative except as noted in the HPI.   Past Surgical History  Procedure Date  . Cataract extraction     left  . Umbilical hernia repair   . Insert / replace / remove pacemaker   . Hernia repair 2012    RIH  . Tonsilectomy, adenoidectomy, bilateral myringotomy and tubes   . Cardioversion 06/17/2011    Procedure: CARDIOVERSION;  Surgeon: [name], MD;  Location: [facility]  OR;  Service: Cardiovascular;  Laterality: N/A;  . Cardioversion 09/09/2011    Procedure: CARDIOVERSION;  Surgeon: Luis Abed, MD;  Location: [facility] OR;  Service: Cardiovascular;  Laterality: N/A;     Family History  Problem Relation Age of Onset  . Heart attack Father 102    deceased  . Aneurysm Mother     deceased AAA  . Hypertension Mother   . Other Brother     deceased at birth     History   Social History  . Marital Status: Married    Spouse Name: N/A    Number of Children: N/A  . Years of Education: N/A   Occupational History  . Not on file.   Social History Main Topics  . Smoking status: Former Smoker    Types: Cigarettes    Quit date: 03/10/1983  . Smokeless tobacco: Never Used  . Alcohol Use: Yes  . Drug Use: No  . Sexually Active: Not on file   Other Topics Concern  . Not on file   Social History Narrative  . No narrative on file     BP 154/96  Pulse 64  Ht 5\' 7"  (1.702  m)  Wt 169 lb (76.658 kg)  BMI 26.47 kg/m2  Physical Exam:  Well appearing NAD HEENT: Unremarkable Neck:  No JVD, no thyromegally Lungs:  Clear with no wheezes, rales, or rhonchi, no hematoma. HEART:  Regular rate rhythm, no murmurs, no rubs, no clicks Abd:  soft, positive bowel sounds, no organomegally, no rebound, no guarding Ext:  2 plus pulses, no edema, no cyanosis, no clubbing Skin:  No rashes no nodules Neuro:  CN II through XII intact, motor grossly intact    Assess/Plan:  PPM pocket swelling Symptomatic bradycardia Rec: will undergo watchful waiting. He is instructed to resume his activity and let us know if he has any fever, chills, or changes in the pocket.  Jeremia Groot, M.D.

## 2011-09-22 NOTE — Assessment & Plan Note (Signed)
No evidence of infection at this time. Hopefully just hematoma. Will follow.

## 2011-09-23 ENCOUNTER — Telehealth: Payer: Self-pay | Admitting: Cardiology

## 2011-09-23 DIAGNOSIS — L219 Seborrheic dermatitis, unspecified: Secondary | ICD-10-CM | POA: Diagnosis not present

## 2011-09-23 NOTE — Telephone Encounter (Signed)
Patient would like to speak with nurse, he can be reached at [phone]

## 2011-09-23 NOTE — Telephone Encounter (Signed)
Spoke with patient and he has an appointment on 8/21 and he is not going to be able to come that day and will be out of town some in August as well. He was unsure how important the visit was and if he could wait a while or if needed to be seen prior to going out of town.  Was just seen yesterday by Dr Graciela Husbands for some swelling at pacer site.  Will forward to Lauren RN and Dr [name], patient aware it may be Friday before he gets a call back.

## 2011-09-24 NOTE — Telephone Encounter (Signed)
I spoke with the pt and he is currently broken down on the side of the road.  The pt is sitting in a police car to keep cool. The pt otherwise states that he is doing well. I have rescheduled the pt's appointment to 11/13/11 at 8:45.

## 2011-10-19 ENCOUNTER — Ambulatory Visit (INDEPENDENT_AMBULATORY_CARE_PROVIDER_SITE_OTHER): Payer: Medicare Other | Admitting: Pharmacist

## 2011-10-19 DIAGNOSIS — I4891 Unspecified atrial fibrillation: Secondary | ICD-10-CM | POA: Diagnosis not present

## 2011-10-19 LAB — POCT INR: INR: 2.5

## 2011-10-28 ENCOUNTER — Ambulatory Visit: Payer: Medicare Other | Admitting: Cardiology

## 2011-11-13 ENCOUNTER — Ambulatory Visit (INDEPENDENT_AMBULATORY_CARE_PROVIDER_SITE_OTHER): Payer: Medicare Other | Admitting: Cardiology

## 2011-11-13 ENCOUNTER — Encounter: Payer: Self-pay | Admitting: Internal Medicine

## 2011-11-13 VITALS — BP 108/65 | HR 71 | Ht 67.0 in | Wt 170.0 lb

## 2011-11-13 DIAGNOSIS — I2510 Atherosclerotic heart disease of native coronary artery without angina pectoris: Secondary | ICD-10-CM | POA: Diagnosis not present

## 2011-11-13 DIAGNOSIS — Z950 Presence of cardiac pacemaker: Secondary | ICD-10-CM

## 2011-11-13 DIAGNOSIS — I4891 Unspecified atrial fibrillation: Secondary | ICD-10-CM | POA: Diagnosis not present

## 2011-11-13 DIAGNOSIS — I428 Other cardiomyopathies: Secondary | ICD-10-CM

## 2011-11-13 NOTE — Patient Instructions (Addendum)
Your physician has requested that you have an echocardiogram in 2 WEEKS. Echocardiography is a painless test that uses sound waves to create images of your heart. It provides your doctor with information about the size and shape of your heart and how well your heart's chambers and valves are working. This procedure takes approximately one hour. There are no restrictions for this procedure.  Your physician recommends that you schedule a follow-up appointment ASAP with Dr Ladona Ridgel for Afib and evaluation of device implant site (within the next month)  Your physician recommends that you schedule a follow-up appointment in: 2 WEEKS with Dr [name]  Your physician recommends that you continue on your current medications as directed. Please refer to the Current Medication list given to you today.

## 2011-11-13 NOTE — Assessment & Plan Note (Addendum)
We will need to continue to monitor him closely. He will have a 2-D echocardiogram in 2 weeks at which time we will reassess his left ventricular function. Since he is post, who expected to be a little bit lower. The numbers we have gotten the past been variable, we will assess when we get these in 2 weeks.

## 2011-11-13 NOTE — Progress Notes (Signed)
HPI:  The patient is in for followup. He thinks he is been in atrial fibrillation for the past couple of weeks. Unlike the last time, he is been relatively active however. He's been continuing to do most things he wants to do. He denies any ongoing chest pain.   Current Outpatient Prescriptions  Medication Sig Dispense Refill  . carvedilol (COREG) 12.5 MG tablet Take 12.5 mg by mouth 2 (two) times daily.      Marland Kitchen desonide (DESOWEN) 0.05 % cream as directed.      . digoxin (LANOXIN) 0.25 MG tablet Take 250 mcg by mouth daily.      Marland Kitchen dofetilide (TIKOSYN) 500 MCG capsule Take 500 mcg by mouth 2 (two) times daily.      . finasteride (PROSCAR) 5 MG tablet 4 days a week      . fluticasone (FLONASE) 50 MCG/ACT nasal spray Place 2 sprays into the nose daily.      Marland Kitchen ketoconazole (NIZORAL) 2 % cream as directed.      . magnesium oxide (MAG-OX) 400 MG tablet Take 400 mg by mouth daily.      . Multiple Vitamins-Minerals (MULTIVITAMIN WITH MINERALS) tablet Take 1 tablet by mouth daily.        . pantoprazole (PROTONIX) 40 MG tablet Take 40 mg by mouth daily.       . pravastatin (PRAVACHOL) 80 MG tablet Take 80 mg by mouth daily.      Marland Kitchen warfarin (COUMADIN) 5 MG tablet Take 5-7.5 mg by mouth daily. Take 1 tablet daily, except take 1 tablets on Tuesdays and Saturdays.       Current Facility-Administered Medications  Medication Dose Route Frequency Provider Last Rate Last Dose  . 0.9 %  sodium chloride infusion  250 mL Intravenous Continuous Herby Abraham, MD      . hydrocortisone cream 1 % 1 application  1 application Topical TID PRN Herby Abraham, MD      . sodium chloride 0.9 % injection 3 mL  3 mL Intravenous Q12H Herby Abraham, MD      . sodium chloride 0.9 % injection 3 mL  3 mL Intravenous PRN Herby Abraham, MD        Allergies  Allergen Reactions  . Antihistamines, Diphenhydramine-Type Other (See Comments)    Causes difficulty in ability to urinate.    Past Medical History    Diagnosis Date  . Coronary artery disease   . Cardiomyopathy primary-nonischemic EF 45%  . Hyperlipidemia   . GERD (gastroesophageal reflux disease)   . Polymyalgia rheumatica   . Sick sinus syndrome   . Atrial fibrillation   . Dysphagia   . Hearing loss, mixed, bilateral   . Increased prostate specific antigen (PSA) velocity   . Lumbar back pain   . BPH (benign prostatic hypertrophy)   . Breast mass   . CHF (congestive heart failure)   . Umbilical hernia   . Right inguinal hernia   . Elevated liver enzymes   . Esophageal stricture   . Dyslipidemia     Past Surgical History  Procedure Date  . Cataract extraction     left  . Umbilical hernia repair   . Insert / replace / remove pacemaker   . Hernia repair 2012    RIH  . Tonsilectomy, adenoidectomy, bilateral myringotomy and tubes   . Cardioversion 06/17/2011    Procedure: CARDIOVERSION;  Surgeon: [name], MD;  Location: [facility] OR;  Service: Cardiovascular;  Laterality:  N/A;  . Cardioversion 09/09/2011    Procedure: CARDIOVERSION;  Surgeon: Luis Abed, MD;  Location: [facility] OR;  Service: Cardiovascular;  Laterality: N/A;    Family History  Problem Relation Age of Onset  . Heart attack Father 49    deceased  . Aneurysm Mother     deceased AAA  . Hypertension Mother   . Other Brother     deceased at birth    History   Social History  . Marital Status: Married    Spouse Name: N/A    Number of Children: N/A  . Years of Education: N/A   Occupational History  . Not on file.   Social History Main Topics  . Smoking status: Former Smoker    Types: Cigarettes    Quit date: 03/10/1983  . Smokeless tobacco: Never Used  . Alcohol Use: Yes  . Drug Use: No  . Sexually Active: Not on file   Other Topics Concern  . Not on file   Social History Narrative  . No narrative on file    ROS: Please see the HPI.  All other systems reviewed and negative.  PHYSICAL EXAM:  BP 108/65  Pulse 71  Ht 5\' 7"  (1.702  m)  Wt 170 lb (77.111 kg)  BMI 26.63 kg/m2  General: Well developed, well nourished, in no acute distress. Head:  Normocephalic and atraumatic. Neck: no JVD Lungs: Clear to auscultation and percussion. Heart: Normal S1 and S2.  No murmur, rubs or gallops.  Pulses: Pulses normal in all 4 extremities. Extremities: No clubbing or cyanosis. No edema. Neurologic: Alert and oriented x 3.  EKG:  V pacing.  Definite P waves not seen.   ASSESSMENT AND PLAN:

## 2011-11-13 NOTE — Assessment & Plan Note (Signed)
The patient was seen and interrogated his pacemaker. He is in atrial fibrillation in fact. His rate however is reasonably well controlled. He's had multiple recurrences now, so strategy of repeat cardioversion probably is not a long-term sustainable strategy. Since he was rate controlled, we could go this route, although it has some concerns about his ventricular function in the past. As a result of this, we will have him return in followup in approximately 2 weeks at which time we will repeat his 2-D echocardiogram. I also talked with Dr. Ladona Ridgel regarding the patient. Our options at this point would include rate control, consideration of atrial fibrillation ablation, or the use of amiodarone. However, with amiodarone in the past he developed liver function abnormalities, so this would not seem to be a good long-term strategy. I will follow him closely in the near term. He is to call us if he has any deterioration when he feels. He has been relatively active, has not had much in the way of major limitation only has noted that he was in atrial fib.

## 2011-11-25 ENCOUNTER — Encounter: Payer: Self-pay | Admitting: Cardiology

## 2011-11-25 ENCOUNTER — Ambulatory Visit (INDEPENDENT_AMBULATORY_CARE_PROVIDER_SITE_OTHER): Payer: Medicare Other | Admitting: *Deleted

## 2011-11-25 ENCOUNTER — Ambulatory Visit (HOSPITAL_COMMUNITY): Payer: Medicare Other | Attending: Cardiology | Admitting: Radiology

## 2011-11-25 ENCOUNTER — Ambulatory Visit (INDEPENDENT_AMBULATORY_CARE_PROVIDER_SITE_OTHER): Payer: Medicare Other | Admitting: Cardiology

## 2011-11-25 VITALS — BP 136/67 | HR 71 | Resp 18 | Ht 67.0 in | Wt 170.8 lb

## 2011-11-25 DIAGNOSIS — I2589 Other forms of chronic ischemic heart disease: Secondary | ICD-10-CM | POA: Insufficient documentation

## 2011-11-25 DIAGNOSIS — I4891 Unspecified atrial fibrillation: Secondary | ICD-10-CM

## 2011-11-25 DIAGNOSIS — I369 Nonrheumatic tricuspid valve disorder, unspecified: Secondary | ICD-10-CM | POA: Insufficient documentation

## 2011-11-25 DIAGNOSIS — I379 Nonrheumatic pulmonary valve disorder, unspecified: Secondary | ICD-10-CM | POA: Insufficient documentation

## 2011-11-25 DIAGNOSIS — Z950 Presence of cardiac pacemaker: Secondary | ICD-10-CM

## 2011-11-25 DIAGNOSIS — I428 Other cardiomyopathies: Secondary | ICD-10-CM | POA: Diagnosis not present

## 2011-11-25 DIAGNOSIS — I059 Rheumatic mitral valve disease, unspecified: Secondary | ICD-10-CM | POA: Diagnosis not present

## 2011-11-25 DIAGNOSIS — H3530 Unspecified macular degeneration: Secondary | ICD-10-CM | POA: Diagnosis not present

## 2011-11-25 DIAGNOSIS — E785 Hyperlipidemia, unspecified: Secondary | ICD-10-CM

## 2011-11-25 DIAGNOSIS — H02839 Dermatochalasis of unspecified eye, unspecified eyelid: Secondary | ICD-10-CM | POA: Diagnosis not present

## 2011-11-25 DIAGNOSIS — I2510 Atherosclerotic heart disease of native coronary artery without angina pectoris: Secondary | ICD-10-CM

## 2011-11-25 DIAGNOSIS — Z961 Presence of intraocular lens: Secondary | ICD-10-CM | POA: Diagnosis not present

## 2011-11-25 LAB — POCT INR: INR: 2.5

## 2011-11-25 MED ORDER — CARVEDILOL 12.5 MG PO TABS: ORAL_TABLET | ORAL | Status: DC

## 2011-11-25 NOTE — Progress Notes (Signed)
Echocardiogram performed.  

## 2011-11-25 NOTE — Patient Instructions (Signed)
Your physician has recommended you make the following change in your medication: INCREASE Carvedilol to 12.5mg  take one and one-half tablet by mouth twice a day  Your physician recommends that you schedule a follow-up appointment in: 6 WEEKS

## 2011-11-28 NOTE — Assessment & Plan Note (Signed)
The patient has had breakthrough currently on dofetilide. He's had a couple of cardioversions this year. His most recent one was somewhat more short lived. Our options include recurrent cardioversion, consideration of atrial fibrillation ablation, or alternative agent. The patient has had a history of some deterioration in his overall left ventricular function all remaining in atrial fib as he has an underlying mild cardiomyopathy. His ejection fraction today appears to be reasonably preserved, and the patient from a symptom standpoint seems better than he has been in the past, and he really notes this. I will have him followup with electrophysiologist to consider these options. He is 82, and therefore one would want to be cautious with regard to invasive electrophysiologic procedures. However, the patient is a very active 78 year old, and this might be a consideration.

## 2011-11-28 NOTE — Assessment & Plan Note (Signed)
Last LDL at target.  Tolerating meds.

## 2011-11-28 NOTE — Assessment & Plan Note (Signed)
No recurrent symptoms.

## 2011-11-28 NOTE — Assessment & Plan Note (Signed)
See echo report.  Follow closely with rhythm change.

## 2011-11-28 NOTE — Progress Notes (Signed)
HPI:   The patient returns today in followup. He's back out of rhythm and we talked about the various options he has been hemodynamically stable, and doesn't feel nearly as bad this time as he has in the past. I also extensively reviewed his echocardiogram with Dr. Sherlie Ban, and actually the patient and I went into the living room is Dr. Sherlie Ban make comments regarding the patient's overall left ventricular function.  He thought it was perhaps slightly better than listed on the report.  No chest pain or other major symptoms at the present time.    Current Outpatient Prescriptions  Medication Sig Dispense Refill  . carvedilol (COREG) 12.5 MG tablet Take one and one-half tablet by mouth twice a day  270 tablet  3  . desonide (DESOWEN) 0.05 % cream as directed.      . digoxin (LANOXIN) 0.25 MG tablet Take 250 mcg by mouth daily.      Marland Kitchen dofetilide (TIKOSYN) 500 MCG capsule Take 500 mcg by mouth 2 (two) times daily.      . finasteride (PROSCAR) 5 MG tablet 4 days a week      . fluticasone (FLONASE) 50 MCG/ACT nasal spray Place 2 sprays into the nose daily.      Marland Kitchen ketoconazole (NIZORAL) 2 % cream as directed.      . magnesium oxide (MAG-OX) 400 MG tablet Take 400 mg by mouth daily.      . Multiple Vitamins-Minerals (MULTIVITAMIN WITH MINERALS) tablet Take 1 tablet by mouth daily.        . pantoprazole (PROTONIX) 40 MG tablet Take 40 mg by mouth daily.       . pravastatin (PRAVACHOL) 80 MG tablet Take 80 mg by mouth daily.      Marland Kitchen warfarin (COUMADIN) 5 MG tablet Take 5-7.5 mg by mouth daily. Take 1 tablet daily, except take 1 tablets on Tuesdays and Saturdays.       Current Facility-Administered Medications  Medication Dose Route Frequency Provider Last Rate Last Dose  . 0.9 %  sodium chloride infusion  250 mL Intravenous Continuous Herby Abraham, MD      . hydrocortisone cream 1 % 1 application  1 application Topical TID PRN Herby Abraham, MD      . sodium chloride 0.9 % injection 3 mL  3 mL  Intravenous Q12H Herby Abraham, MD      . sodium chloride 0.9 % injection 3 mL  3 mL Intravenous PRN Herby Abraham, MD        Allergies  Allergen Reactions  . Antihistamines, Diphenhydramine-Type Other (See Comments)    Causes difficulty in ability to urinate.    Past Medical History  Diagnosis Date  . Coronary artery disease   . Cardiomyopathy primary-nonischemic EF 45%  . Hyperlipidemia   . GERD (gastroesophageal reflux disease)   . Polymyalgia rheumatica   . Sick sinus syndrome   . Atrial fibrillation   . Dysphagia   . Hearing loss, mixed, bilateral   . Increased prostate specific antigen (PSA) velocity   . Lumbar back pain   . BPH (benign prostatic hypertrophy)   . Breast mass   . CHF (congestive heart failure)   . Umbilical hernia   . Right inguinal hernia   . Elevated liver enzymes   . Esophageal stricture   . Dyslipidemia     Past Surgical History  Procedure Date  . Cataract extraction     left  . Umbilical hernia repair   .  Insert / replace / remove pacemaker   . Hernia repair 2012    RIH  . Tonsilectomy, adenoidectomy, bilateral myringotomy and tubes   . Cardioversion 06/17/2011    Procedure: CARDIOVERSION;  Surgeon: [name], MD;  Location: [facility] OR;  Service: Cardiovascular;  Laterality: N/A;  . Cardioversion 09/09/2011    Procedure: CARDIOVERSION;  Surgeon: Luis Abed, MD;  Location: [facility] OR;  Service: Cardiovascular;  Laterality: N/A;    Family History  Problem Relation Age of Onset  . Heart attack Father 32    deceased  . Aneurysm Mother     deceased AAA  . Hypertension Mother   . Other Brother     deceased at birth    History   Social History  . Marital Status: Married    Spouse Name: N/A    Number of Children: N/A  . Years of Education: N/A   Occupational History  . Not on file.   Social History Main Topics  . Smoking status: Former Smoker    Types: Cigarettes    Quit date: 03/10/1983  . Smokeless tobacco: Never Used    . Alcohol Use: Yes  . Drug Use: No  . Sexually Active: Not on file   Other Topics Concern  . Not on file   Social History Narrative  . No narrative on file    ROS: Please see the HPI.  All other systems reviewed and negative.  PHYSICAL EXAM:  BP 136/67  Pulse 71  Resp 18  Ht 5\' 7"  (1.702 m)  Wt 170 lb 12.8 oz (77.474 kg)  BMI 26.75 kg/m2  SpO2 99%  General: Well developed, well nourished, in no acute distress. Head:  Normocephalic and atraumatic. Neck: no JVD Lungs: Clear to auscultation and percussion. Heart: irregularly irregular rhythm.  No murmur.   Pulses: Pulses normal in all 4 extremities. Extremities: No clubbing or cyanosis. No edema. Neurologic: Alert and oriented x 3.  EKG:  Atrial fib with controlled ventricular response. Rate 94.  Delay in R wave progression.  Rare paced beat.   ASSESSMENT AND PLAN:

## 2011-12-04 DIAGNOSIS — Z23 Encounter for immunization: Secondary | ICD-10-CM | POA: Diagnosis not present

## 2011-12-10 ENCOUNTER — Encounter: Payer: Self-pay | Admitting: *Deleted

## 2011-12-16 ENCOUNTER — Encounter: Payer: Self-pay | Admitting: Internal Medicine

## 2011-12-16 ENCOUNTER — Ambulatory Visit (INDEPENDENT_AMBULATORY_CARE_PROVIDER_SITE_OTHER): Payer: Medicare Other | Admitting: Internal Medicine

## 2011-12-16 VITALS — BP 118/72 | HR 68 | Ht 67.0 in | Wt 171.0 lb

## 2011-12-16 DIAGNOSIS — I4891 Unspecified atrial fibrillation: Secondary | ICD-10-CM

## 2011-12-16 DIAGNOSIS — Z950 Presence of cardiac pacemaker: Secondary | ICD-10-CM | POA: Diagnosis not present

## 2011-12-16 LAB — HEPATIC FUNCTION PANEL
Albumin: 4 g/dL (ref 3.5–5.2)
Total Protein: 7 g/dL (ref 6.0–8.3)

## 2011-12-16 LAB — PACEMAKER DEVICE OBSERVATION
AL AMPLITUDE: 0.3 mv
BAMS-0001: 150 {beats}/min
BAMS-0003: 70 {beats}/min
BATTERY VOLTAGE: 2.78 V
RV LEAD AMPLITUDE: 12 mv
RV LEAD THRESHOLD: 0.75 V
VENTRICULAR PACING PM: 1.4

## 2011-12-16 LAB — TSH: TSH: 2.58 u[IU]/mL (ref 0.35–5.50)

## 2011-12-16 MED ORDER — AMIODARONE HCL 200 MG PO TABS: 200.0000 mg | ORAL_TABLET | Freq: Every day | ORAL | Status: DC

## 2011-12-16 NOTE — Assessment & Plan Note (Signed)
His atrial fibrillation is getting worse and he is having more symptoms. He does note that in atrial fibrillation, he does not feel as bad as he once did. After much discussion about the treatment options, I recommended that the patient stop his dofetilide, and retry amiodarone. We'll obtain baseline liver function tests and electrolytes and TSH today. I'll plan to see him back in approximately 6 weeks. If he has not reverted to sinus rhythm, without cardioversion. We will need to follow his liver function tests closely.

## 2011-12-16 NOTE — Patient Instructions (Addendum)
Your physician recommends that you schedule a follow-up appointment in: 6 weeks with Dr Ladona Ridgel   Your physician has recommended you make the following change in your medication:  1) Stop Tikosyn on Thurs 2) Start Amiodarone 200mg  daily  Your physician recommends that you return for lab work today

## 2011-12-16 NOTE — Assessment & Plan Note (Signed)
His [employer] dual-chamber pacemaker is working normally. We'll plan to recheck in several months. 

## 2011-12-16 NOTE — Progress Notes (Signed)
HPI [name] returns today for followup. He is a very pleasant 78 year old man with a nonischemic cardiomyopathy, class II congestive heart failure, paroxysmal atrial fibrillation, who has maintained sinus rhythm very nicely over the last several years on dofetilide. In the last 6 months however he has increasingly gone out of rhythm. Since his last pacemaker check, he has been in rhythm 62% of the time and have rhythm 38% of the time. He feels his heart beating irregularly and overall his energy level is reduced. He saw Dr. [name] several weeks ago and had his dose of carvedilol increased. Since then he has been stable but notes reduced energy. I've tried to review his old records but our electronic medical record system precludes me from being able to go back more than a couple of years. The patient notes however that on amiodarone, he was thought to have had elevated liver function test. On amiodarone however he felt well.  Allergies  Allergen Reactions  . Antihistamines, Diphenhydramine-Type Other (See Comments)    Causes difficulty in ability to urinate.     Current Outpatient Prescriptions  Medication Sig Dispense Refill  . carvedilol (COREG) 12.5 MG tablet Take one and one-half tablet by mouth twice a day  270 tablet  3  . desonide (DESOWEN) 0.05 % cream as needed.       . digoxin (LANOXIN) 0.25 MG tablet Take 250 mcg by mouth daily.      Marland Kitchen dofetilide (TIKOSYN) 500 MCG capsule Take 500 mcg by mouth 2 (two) times daily.      . finasteride (PROSCAR) 5 MG tablet 4 days a week      . fluticasone (FLONASE) 50 MCG/ACT nasal spray Place 2 sprays into the nose daily.      Marland Kitchen ketoconazole (NIZORAL) 2 % cream as directed.      . magnesium oxide (MAG-OX) 400 MG tablet Take 400 mg by mouth daily.      . Multiple Vitamins-Minerals (MULTIVITAMIN WITH MINERALS) tablet Take 1 tablet by mouth daily.        . pantoprazole (PROTONIX) 40 MG tablet Take 40 mg by mouth daily.       . pravastatin  (PRAVACHOL) 80 MG tablet Take 80 mg by mouth daily.      Marland Kitchen warfarin (COUMADIN) 5 MG tablet Take 5-7.5 mg by mouth daily. Take 1 tablet daily, except take 1 tablets on Tuesdays and Saturdays.       Current Facility-Administered Medications  Medication Dose Route Frequency Provider Last Rate Last Dose  . 0.9 %  sodium chloride infusion  250 mL Intravenous Continuous Herby Abraham, MD      . hydrocortisone cream 1 % 1 application  1 application Topical TID PRN Herby Abraham, MD      . sodium chloride 0.9 % injection 3 mL  3 mL Intravenous Q12H Herby Abraham, MD      . sodium chloride 0.9 % injection 3 mL  3 mL Intravenous PRN Herby Abraham, MD         Past Medical History  Diagnosis Date  . Coronary artery disease   . Cardiomyopathy primary-nonischemic EF 45%  . Hyperlipidemia   . GERD (gastroesophageal reflux disease)   . Polymyalgia rheumatica   . Sick sinus syndrome   . Atrial fibrillation   . Dysphagia   . Hearing loss, mixed, bilateral   . Increased prostate specific antigen (PSA) velocity   . Lumbar back pain   . BPH (benign prostatic hypertrophy)   .  Breast mass   . CHF (congestive heart failure)   . Umbilical hernia   . Right inguinal hernia   . Elevated liver enzymes   . Esophageal stricture   . Dyslipidemia     ROS:   All systems reviewed and negative except as noted in the HPI.   Past Surgical History  Procedure Date  . Cataract extraction     left  . Umbilical hernia repair   . Insert / replace / remove pacemaker   . Hernia repair 2012    RIH  . Tonsilectomy, adenoidectomy, bilateral myringotomy and tubes   . Cardioversion 06/17/2011    Procedure: CARDIOVERSION;  Surgeon: [name], MD;  Location: [facility] OR;  Service: Cardiovascular;  Laterality: N/A;  . Cardioversion 09/09/2011    Procedure: CARDIOVERSION;  Surgeon: Luis Abed, MD;  Location: [facility] OR;  Service: Cardiovascular;  Laterality: N/A;     Family History  Problem Relation Age  of Onset  . Heart attack Father 83    deceased  . Aneurysm Mother     deceased AAA  . Hypertension Mother   . Other Brother     deceased at birth     History   Social History  . Marital Status: Married    Spouse Name: N/A    Number of Children: N/A  . Years of Education: N/A   Occupational History  . Not on file.   Social History Main Topics  . Smoking status: Former Smoker    Types: Cigarettes    Quit date: 03/10/1983  . Smokeless tobacco: Never Used  . Alcohol Use: Yes  . Drug Use: No  . Sexually Active: Not on file   Other Topics Concern  . Not on file   Social History Narrative  . No narrative on file     BP 118/72  Pulse 68  Ht 5\' 7"  (1.702 m)  Wt 171 lb (77.565 kg)  BMI 26.78 kg/m2  Physical Exam:  Well appearing 78 year old man, NAD HEENT: Unremarkable Neck:  No JVD, no thyromegally Lungs:  Clear with no wheezes, rales, or rhonchi. HEART:  IRegular rate rhythm, no murmurs, no rubs, no clicks Abd:  soft, positive bowel sounds, no organomegally, no rebound, no guarding Ext:  2 plus pulses, no edema, no cyanosis, no clubbing Skin:  No rashes no nodules Neuro:  CN II through XII intact, motor grossly intact  DEVICE  Normal device function.  See PaceArt for details.   Assess/Plan:

## 2011-12-23 ENCOUNTER — Telehealth: Payer: Self-pay | Admitting: Cardiology

## 2011-12-23 NOTE — Telephone Encounter (Signed)
F/u  Pt returning nurse call he can be reached at [phone]

## 2011-12-23 NOTE — Telephone Encounter (Signed)
Reviewed message with Dr [name] and he would like to see the pt next week. I left the pt a message to call back and schedule an appointment next week with Dr [name].

## 2011-12-23 NOTE — Telephone Encounter (Signed)
Left message to call back  

## 2011-12-23 NOTE — Telephone Encounter (Signed)
I spoke with the pt and he complained of SOB.  The pt went to the gym this morning and exercised but it was difficult because he was short winded.  The pt is in Afib all the time but he does not feel as bad as he has in the past with his Afib.  The pt was wondering if this was related to higher dose of Coreg or from switching from Tikosyn to Amiodarone.  The pt's BP this morning was 93/57 while at the gym.  The pt has not rechecked his BP and I recommended that he start checking this on a regular basis.  The pt is going to decrease his Coreg back to 12.5mg  twice a day and if his BP increases he will start Coreg 18.75mg  in the morning and 12.5mg  in the evening.  The agreed with plan and will contact our office if his SOB worsens.

## 2011-12-23 NOTE — Telephone Encounter (Signed)
New Problem:    Patient called in wanting to ask you about some medication.  His blood pressure is low this morning 93/57.  Please call back.

## 2011-12-29 NOTE — Telephone Encounter (Signed)
The pt is scheduled to see Dr [name] on 12/30/11.

## 2011-12-30 ENCOUNTER — Ambulatory Visit (INDEPENDENT_AMBULATORY_CARE_PROVIDER_SITE_OTHER): Payer: Medicare Other | Admitting: Cardiology

## 2011-12-30 ENCOUNTER — Encounter: Payer: Self-pay | Admitting: Cardiology

## 2011-12-30 VITALS — BP 112/72 | HR 69 | Ht 67.0 in | Wt 170.0 lb

## 2011-12-30 DIAGNOSIS — I4891 Unspecified atrial fibrillation: Secondary | ICD-10-CM

## 2011-12-30 DIAGNOSIS — I2510 Atherosclerotic heart disease of native coronary artery without angina pectoris: Secondary | ICD-10-CM | POA: Diagnosis not present

## 2011-12-30 DIAGNOSIS — I428 Other cardiomyopathies: Secondary | ICD-10-CM | POA: Diagnosis not present

## 2011-12-30 LAB — CBC WITH DIFFERENTIAL/PLATELET
Basophils Relative: 0.7 % (ref 0.0–3.0)
Eosinophils Relative: 2.7 % (ref 0.0–5.0)
Hemoglobin: 14.9 g/dL (ref 13.0–17.0)
Lymphocytes Relative: 32.5 % (ref 12.0–46.0)
MCV: 91.8 fl (ref 78.0–100.0)
Monocytes Absolute: 0.8 10*3/uL (ref 0.1–1.0)
Neutro Abs: 5.1 10*3/uL (ref 1.4–7.7)
Neutrophils Relative %: 54.9 % (ref 43.0–77.0)
RBC: 4.96 Mil/uL (ref 4.22–5.81)
WBC: 9.2 10*3/uL (ref 4.5–10.5)

## 2011-12-30 LAB — BASIC METABOLIC PANEL
Calcium: 9.2 mg/dL (ref 8.4–10.5)
GFR: 82.96 mL/min (ref >60.00)
Glucose, Bld: 76 mg/dL (ref 70–99)
Potassium: 4.4 mEq/L (ref 3.5–5.1)
Sodium: 136 mEq/L (ref 135–145)

## 2011-12-30 LAB — HEPATIC FUNCTION PANEL
Albumin: 3.8 g/dL (ref 3.5–5.2)
Alkaline Phosphatase: 46 U/L (ref 39–117)
Total Bilirubin: 1 mg/dL (ref 0.3–1.2)

## 2011-12-30 LAB — PROTIME-INR
INR: 3.1 ratio — ABNORMAL HIGH (ref 0.8–1.0)
Prothrombin Time: 31.6 s — ABNORMAL HIGH (ref 10.2–12.4)

## 2011-12-30 NOTE — Assessment & Plan Note (Signed)
He's not had recurrent symptoms.

## 2011-12-30 NOTE — Assessment & Plan Note (Signed)
Patient's had increasing symptomatology, likely from atrial fibrillation. This is occurred the past. He is pretty well block rate standpoint and he does have backup pacing. However he does not tolerate being out of sinus rhythm very well. We will go ahead and check his liver functions today. In addition we will check his other laboratories, in sure that his INR has been therapeutic. We plan to do a cardioversion on Friday. He is highly familiar with the procedure, and wishes to proceed.

## 2011-12-30 NOTE — Patient Instructions (Addendum)
Your physician has recommended that you have a Cardioversion (DCCV). Electrical Cardioversion uses a jolt of electricity to your heart either through paddles or wired patches attached to your chest. This is a controlled, usually prescheduled, procedure. Defibrillation is done under light anesthesia in the hospital, and you usually go home the day of the procedure. This is done to get your heart back into a normal rhythm. You are not awake for the procedure. Please see the instruction sheet given to you today.  Your physician recommends that you continue on your current medications as directed. Please refer to the Current Medication list given to you today.  Your physician recommends that you have lab work today: Digoxin level, BMP, LIVER, Free T4, TSH, CBC, PT/INR  Your physician recommends that you keep your scheduled follow-up appointments.

## 2011-12-30 NOTE — Progress Notes (Signed)
 HPI:  Patient returns today in a followup visit. He is symptomatically worse. He is moderately short of breath and doesn't feel really very well. This is typically in the past when he developed some cardiomyopathy after going out of normal sinus rhythm. He has been on amiodarone more recently, this was changed to Amiodarone.  He had problems in the past with elevated LFTs with Amiodarone.  He denies any chest pain.  He feels like a cardioversion might help.  I agree.    Current Outpatient Prescriptions  Medication Sig Dispense Refill  . amiodarone (PACERONE) 200 MG tablet Take 1 tablet (200 mg total) by mouth daily.  90 tablet  3  . carvedilol (COREG) 12.5 MG tablet Take one and one-half tablet by mouth twice a day  270 tablet  3  . digoxin (LANOXIN) 0.25 MG tablet Take 250 mcg by mouth daily.      . finasteride (PROSCAR) 5 MG tablet 4 days a week      . fluticasone (FLONASE) 50 MCG/ACT nasal spray Place 2 sprays into the nose daily.      . ketoconazole (NIZORAL) 2 % cream as directed.      . magnesium oxide (MAG-OX) 400 MG tablet Take 400 mg by mouth daily.      . Multiple Vitamins-Minerals (MULTIVITAMIN WITH MINERALS) tablet Take 1 tablet by mouth daily.        . pantoprazole (PROTONIX) 40 MG tablet Take 40 mg by mouth daily.       . pravastatin (PRAVACHOL) 80 MG tablet Take 80 mg by mouth daily.      . warfarin (COUMADIN) 5 MG tablet Take 5-7.5 mg by mouth daily. Take 1 tablet daily, except take 1 tablets on Tuesdays and Saturdays.       Current Facility-Administered Medications  Medication Dose Route Frequency Provider Last Rate Last Dose  . 0.9 %  sodium chloride infusion  250 mL Intravenous Continuous Vondra Aldredge D Sean Macwilliams, MD      . hydrocortisone cream 1 % 1 application  1 application Topical TID PRN Sanford Lindblad D Rudie Sermons, MD      . sodium chloride 0.9 % injection 3 mL  3 mL Intravenous Q12H Bekim Werntz D Joellen Tullos, MD      . sodium chloride 0.9 % injection 3 mL  3 mL Intravenous PRN Mekala Winger D  Krystena Reitter, MD        Allergies  Allergen Reactions  . Antihistamines, Diphenhydramine-Type Other (See Comments)    Causes difficulty in ability to urinate.    Past Medical History  Diagnosis Date  . Coronary artery disease   . Cardiomyopathy primary-nonischemic EF 45%  . Hyperlipidemia   . GERD (gastroesophageal reflux disease)   . Polymyalgia rheumatica   . Sick sinus syndrome   . Atrial fibrillation   . Dysphagia   . Hearing loss, mixed, bilateral   . Increased prostate specific antigen (PSA) velocity   . Lumbar back pain   . BPH (benign prostatic hypertrophy)   . Breast mass   . CHF (congestive heart failure)   . Umbilical hernia   . Right inguinal hernia   . Elevated liver enzymes   . Esophageal stricture   . Dyslipidemia     Past Surgical History  Procedure Date  . Cataract extraction     left  . Umbilical hernia repair   . Insert / replace / remove pacemaker   . Hernia repair 2012    [facility]  . Tonsilectomy, adenoidectomy, bilateral myringotomy and tubes   .   Cardioversion 06/17/2011    Procedure: CARDIOVERSION;  Surgeon: Brian S Crenshaw, MD;  Location: [facility] OR;  Service: Cardiovascular;  Laterality: N/A;  . Cardioversion 09/09/2011    Procedure: CARDIOVERSION;  Surgeon: Jeffrey D Katz, MD;  Location: [facility] OR;  Service: Cardiovascular;  Laterality: N/A;    Family History  Problem Relation Age of Onset  . Heart attack Father 33    deceased  . Aneurysm Mother     deceased AAA  . Hypertension Mother   . Other Brother     deceased at birth    History   Social History  . Marital Status: Married    Spouse Name: N/A    Number of Children: N/A  . Years of Education: N/A   Occupational History  . Not on file.   Social History Main Topics  . Smoking status: Former Smoker    Types: Cigarettes    Quit date: 03/10/1983  . Smokeless tobacco: Never Used  . Alcohol Use: Yes  . Drug Use: No  . Sexually Active: Not on file   Other Topics Concern  . Not on file    Social History Narrative  . No narrative on file    ROS: Please see the HPI.  All other systems reviewed and negative.  PHYSICAL EXAM:  BP 112/72  Pulse 69  Ht 5' 7" (1.702 m)  Wt 170 lb (77.111 kg)  BMI 26.63 kg/m2  General: Well developed, well nourished, in no acute distress. Head:  Normocephalic and atraumatic. Neck: no JVD Lungs: Clear to auscultation and percussion. Heart: Normal S1 and S2.  No murmur, rubs or gallops. Pacer site looks good.   Pulses: Pulses normal in all 4 extremities. Extremities: No clubbing or cyanosis. No edema. Neurologic: Alert and oriented x 3.  EKG:  Atrial fib.  Background pacing.   ASSESSMENT AND PLAN: 

## 2011-12-30 NOTE — Assessment & Plan Note (Signed)
This tends to get worse when he is out of normal rhythm. We have not had a repeat echocardiogram, but we are proceeding with cardioversion so it would be somewhat academic at this point in time.

## 2011-12-31 LAB — DIGOXIN LEVEL: Digoxin Level: 2.1 ng/mL — ABNORMAL HIGH (ref 0.8–2.0)

## 2012-01-01 ENCOUNTER — Encounter (HOSPITAL_COMMUNITY): Payer: Self-pay | Admitting: *Deleted

## 2012-01-01 ENCOUNTER — Encounter (HOSPITAL_COMMUNITY): Payer: Self-pay

## 2012-01-01 ENCOUNTER — Encounter (HOSPITAL_COMMUNITY)
Admission: RE | Disposition: A | Discharge status: Home / Self Care | Payer: Self-pay | Source: Ambulatory Visit | Attending: Cardiology

## 2012-01-01 ENCOUNTER — Ambulatory Visit (HOSPITAL_COMMUNITY): Payer: Medicare Other | Admitting: *Deleted

## 2012-01-01 ENCOUNTER — Ambulatory Visit (HOSPITAL_COMMUNITY)
Admission: RE | Admit: 2012-01-01 | Discharge: 2012-01-01 | Disposition: A | Discharge status: Home / Self Care | Payer: Medicare Other | Source: Ambulatory Visit | Attending: Cardiology | Admitting: Cardiology

## 2012-01-01 DIAGNOSIS — I4891 Unspecified atrial fibrillation: Secondary | ICD-10-CM

## 2012-01-01 HISTORY — PX: CARDIOVERSION: SHX1299

## 2012-01-01 HISTORY — PX: TEE WITHOUT CARDIOVERSION: SHX5443

## 2012-01-01 LAB — PROTIME-INR: Prothrombin Time: 20.2 seconds — ABNORMAL HIGH (ref 11.6–15.2)

## 2012-01-01 SURGERY — CARDIOVERSION
Anesthesia: Monitor Anesthesia Care

## 2012-01-01 MED ORDER — PROPOFOL 10 MG/ML IV BOLUS
INTRAVENOUS | Status: DC | PRN
  Administered 2012-01-01: 50 mg via INTRAVENOUS

## 2012-01-01 MED ORDER — SODIUM CHLORIDE 0.9 % IJ SOLN: 3.0000 mL | INTRAMUSCULAR | Status: DC | PRN

## 2012-01-01 MED ORDER — MIDAZOLAM HCL 5 MG/5ML IJ SOLN
INTRAMUSCULAR | Status: DC | PRN
  Administered 2012-01-01: 2 mg via INTRAVENOUS

## 2012-01-01 MED ORDER — SODIUM CHLORIDE 0.9 % IJ SOLN: 3.0000 mL | Freq: Two times a day (BID) | INTRAMUSCULAR | Status: DC

## 2012-01-01 MED ORDER — FENTANYL CITRATE 0.05 MG/ML IJ SOLN
INTRAMUSCULAR | Status: AC
  Filled 2012-01-01: qty 2

## 2012-01-01 MED ORDER — MIDAZOLAM HCL 5 MG/ML IJ SOLN
INTRAMUSCULAR | Status: AC
  Filled 2012-01-01: qty 2

## 2012-01-01 MED ORDER — SODIUM CHLORIDE 0.9 % IV SOLN: 250.0000 mL | INTRAVENOUS | Status: DC

## 2012-01-01 MED ORDER — ENOXAPARIN SODIUM 80 MG/0.8ML ~~LOC~~ SOLN
80.0000 mg | SUBCUTANEOUS | Status: AC
  Administered 2012-01-01: 80 mg via SUBCUTANEOUS
  Filled 2012-01-01: qty 0.8

## 2012-01-01 MED ORDER — SODIUM CHLORIDE 0.9 % IV SOLN
INTRAVENOUS | Status: DC | PRN
  Administered 2012-01-01: 13:00:00 via INTRAVENOUS

## 2012-01-01 MED ORDER — FENTANYL CITRATE 0.05 MG/ML IJ SOLN
INTRAMUSCULAR | Status: DC | PRN
  Administered 2012-01-01: 25 ug via INTRAVENOUS

## 2012-01-01 MED ORDER — ENOXAPARIN SODIUM 150 MG/ML ~~LOC~~ SOLN: 1.0000 mg/kg | Freq: Once | SUBCUTANEOUS | Status: DC

## 2012-01-01 MED ORDER — LIDOCAINE HCL (CARDIAC) 20 MG/ML IV SOLN
INTRAVENOUS | Status: DC | PRN
  Administered 2012-01-01: 40 mg via INTRAVENOUS

## 2012-01-01 NOTE — Op Note (Signed)
Patient anesthetized by anesthesia with 40 mg lidocaine and 60 mg Propofol IV With pads in AP position patient cardioverted with 200J synchronized biphasic energy to SR.   Pacer interrogated Procedure without complication Patient will take extra coumadin tonight  Will get Lovenox injection now.  INR on Monday.

## 2012-01-01 NOTE — H&P (Signed)
HPI: Patient returns today in a followup visit. He is symptomatically worse. He is moderately short of breath and doesn't feel really very well. This is typically in the past when he developed some cardiomyopathy after going out of normal sinus rhythm. He has been on amiodarone more recently, this was changed to Amiodarone. He had problems in the past with elevated LFTs with Amiodarone. He denies any chest pain. He feels like a cardioversion might help. I agree.  Current Outpatient Prescriptions   Medication  Sig  Dispense  Refill   .  amiodarone (PACERONE) 200 MG tablet  Take 1 tablet (200 mg total) by mouth daily.  90 tablet  3   .  carvedilol (COREG) 12.5 MG tablet  Take one and one-half tablet by mouth twice a day  270 tablet  3   .  digoxin (LANOXIN) 0.25 MG tablet  Take 250 mcg by mouth daily.     .  finasteride (PROSCAR) 5 MG tablet  4 days a week     .  fluticasone (FLONASE) 50 MCG/ACT nasal spray  Place 2 sprays into the nose daily.     Marland Kitchen  ketoconazole (NIZORAL) 2 % cream  as directed.     .  magnesium oxide (MAG-OX) 400 MG tablet  Take 400 mg by mouth daily.     .  Multiple Vitamins-Minerals (MULTIVITAMIN WITH MINERALS) tablet  Take 1 tablet by mouth daily.     .  pantoprazole (PROTONIX) 40 MG tablet  Take 40 mg by mouth daily.     .  pravastatin (PRAVACHOL) 80 MG tablet  Take 80 mg by mouth daily.     Marland Kitchen  warfarin (COUMADIN) 5 MG tablet  Take 5-7.5 mg by mouth daily. Take 1 tablet daily, except take 1 tablets on Tuesdays and Saturdays.      Current Facility-Administered Medications   Medication  Dose  Route  Frequency  Provider  Last Rate  Last Dose   .  0.9 % sodium chloride infusion  250 mL  Intravenous  Continuous  Herby Abraham, MD     .  hydrocortisone cream 1 % 1 application  1 application  Topical  TID PRN  Herby Abraham, MD     .  sodium chloride 0.9 % injection 3 mL  3 mL  Intravenous  Q12H  Herby Abraham, MD     .  sodium chloride 0.9 % injection 3 mL  3 mL   Intravenous  PRN  Herby Abraham, MD      Allergies   Allergen  Reactions   .  Antihistamines, Diphenhydramine-Type  Other (See Comments)     Causes difficulty in ability to urinate.    Past Medical History   Diagnosis  Date   .  Coronary artery disease    .  Cardiomyopathy  primary-nonischemic EF 45%   .  Hyperlipidemia    .  GERD (gastroesophageal reflux disease)    .  Polymyalgia rheumatica    .  Sick sinus syndrome    .  Atrial fibrillation    .  Dysphagia    .  Hearing loss, mixed, bilateral    .  Increased prostate specific antigen (PSA) velocity    .  Lumbar back pain    .  BPH (benign prostatic hypertrophy)    .  Breast mass    .  CHF (congestive heart failure)    .  Umbilical hernia    .  Right  inguinal hernia    .  Elevated liver enzymes    .  Esophageal stricture    .  Dyslipidemia     Past Surgical History   Procedure  Date   .  Cataract extraction      left   .  Umbilical hernia repair    .  Insert / replace / remove pacemaker    .  Hernia repair  2012     [facility]   .  Tonsilectomy, adenoidectomy, bilateral myringotomy and tubes    .  Cardioversion  06/17/2011     Procedure: CARDIOVERSION; Surgeon: [name], MD; Location: [facility] OR; Service: Cardiovascular; Laterality: N/A;   .  Cardioversion  09/09/2011     Procedure: CARDIOVERSION; Surgeon: Luis Abed, MD; Location: [facility] OR; Service: Cardiovascular; Laterality: N/A;    Family History   Problem  Relation  Age of Onset   .  Heart attack  Father  56      deceased    .  Aneurysm  Mother       deceased AAA    .  Hypertension  Mother    .  Other  Brother       deceased at birth    History    Social History   .  Marital Status:  Married     Spouse Name:  N/A     Number of Children:  N/A   .  Years of Education:  N/A    Occupational History   .  Not on file.    Social History Main Topics   .  Smoking status:  Former Smoker     Types:  Cigarettes     Quit date:  03/10/1983   .  Smokeless  tobacco:  Never Used   .  Alcohol Use:  Yes   .  Drug Use:  No   .  Sexually Active:  Not on file    Other Topics  Concern   .  Not on file    Social History Narrative   .  No narrative on file    ROS:  Please see the HPI. All other systems reviewed and negative.  PHYSICAL EXAM:  BP 112/72  Pulse 69  Ht 5\' 7"  (1.702 m)  Wt 170 lb (77.111 kg)  BMI 26.63 kg/m2  General: Well developed, well nourished, in no acute distress.  Head: Normocephalic and atraumatic.  Neck: no JVD  Lungs: Clear to auscultation and percussion.  Heart: Normal S1 and S2. No murmur, rubs or gallops. Pacer site looks good.  Pulses: Pulses normal in all 4 extremities.  Extremities: No clubbing or cyanosis. No edema.  Neurologic: Alert and oriented x 3.  EKG: Atrial fib. Background pacing.     Case reviewed.  Patient is acceptable for TEE.  If no thrombus plan cardioversion Patient understands risks/benefits.  Agreed to proceed.

## 2012-01-01 NOTE — Anesthesia Preprocedure Evaluation (Signed)
Anesthesia Evaluation  Patient identified by MRN, date of birth, ID band Patient awake    Reviewed: Allergy & Precautions, H&P , NPO status , Patient's Chart, lab work & pertinent test results  History of Anesthesia Complications Negative for: history of anesthetic complications  Airway Mallampati: II TM Distance: >3 FB   Mouth opening: Limited Mouth Opening  Dental  (+) Dental Advisory Given   Pulmonary neg pulmonary ROS,          Cardiovascular + CAD and +CHF + dysrhythmias Atrial Fibrillation     Neuro/Psych negative neurological ROS  negative psych ROS   GI/Hepatic Neg liver ROS, GERD-  ,  Endo/Other  negative endocrine ROS  Renal/GU negative Renal ROS  negative genitourinary   Musculoskeletal   Abdominal   Peds  Hematology   Anesthesia Other Findings   Reproductive/Obstetrics                           Anesthesia Physical Anesthesia Plan  ASA: III  Anesthesia Plan: General   Post-op Pain Management:    Induction:   Airway Management Planned: Mask  Additional Equipment:   Intra-op Plan:   Post-operative Plan:   Informed Consent:   Dental advisory given  Plan Discussed with: CRNA, Anesthesiologist and Surgeon  Anesthesia Plan Comments:         Anesthesia Quick Evaluation

## 2012-01-01 NOTE — Transfer of Care (Signed)
Immediate Anesthesia Transfer of Care Note  Patient: [name]  Procedure(s) Performed: Procedure(s) (LRB) with comments: CARDIOVERSION (N/A) TRANSESOPHAGEAL ECHOCARDIOGRAM (TEE) (N/A)  Patient Location: PACU  Anesthesia Type: General  Level of Consciousness: sedated  Airway & Oxygen Therapy: Patient Spontanous Breathing and Patient connected to nasal cannula oxygen  Post-op Assessment: Report given to PACU RN and Post -op Vital signs reviewed and stable  Post vital signs: Reviewed and stable  Complications: No apparent anesthesia complications

## 2012-01-01 NOTE — Interval H&P Note (Signed)
History and Physical Interval Note:  01/01/2012 10:17 AM  [name]  has presented today for surgery, with the diagnosis of Afib  The various methods of treatment have been discussed with the patient and family. After consideration of risks, benefits and other options for treatment, the patient has consented to  Procedure(s) (LRB) with comments: CARDIOVERSION (N/A) as a surgical intervention .  The patient's history has been reviewed, patient examined, no change in status, stable for surgery.  I have reviewed the patient's chart and labs.  Questions were answered to the patient's satisfaction.     Shawnie Pons

## 2012-01-01 NOTE — Preoperative (Signed)
Beta Blockers   Reason not to administer Beta Blockers:Not Applicable 

## 2012-01-01 NOTE — Op Note (Signed)
No LA, LAA thrombus Full report to follow. 

## 2012-01-01 NOTE — Anesthesia Postprocedure Evaluation (Signed)
  Anesthesia Post-op Note  Patient: [name]  Procedure(s) Performed: Procedure(s) (LRB) with comments: CARDIOVERSION (N/A) TRANSESOPHAGEAL ECHOCARDIOGRAM (TEE) (N/A)  Patient Location: PACU  Anesthesia Type: General  Level of Consciousness: sedated  Airway and Oxygen Therapy: Patient Spontanous Breathing and Patient connected to nasal cannula oxygen  Post-op Pain: none  Post-op Assessment: Post-op Vital signs reviewed  Post-op Vital Signs: Reviewed and stable  Complications: No apparent anesthesia complications

## 2012-01-01 NOTE — H&P (View-Only) (Signed)
HPI:  Patient returns today in a followup visit. He is symptomatically worse. He is moderately short of breath and doesn't feel really very well. This is typically in the past when he developed some cardiomyopathy after going out of normal sinus rhythm. He has been on amiodarone more recently, this was changed to Amiodarone.  He had problems in the past with elevated LFTs with Amiodarone.  He denies any chest pain.  He feels like a cardioversion might help.  I agree.    Current Outpatient Prescriptions  Medication Sig Dispense Refill  . amiodarone (PACERONE) 200 MG tablet Take 1 tablet (200 mg total) by mouth daily.  90 tablet  3  . carvedilol (COREG) 12.5 MG tablet Take one and one-half tablet by mouth twice a day  270 tablet  3  . digoxin (LANOXIN) 0.25 MG tablet Take 250 mcg by mouth daily.      . finasteride (PROSCAR) 5 MG tablet 4 days a week      . fluticasone (FLONASE) 50 MCG/ACT nasal spray Place 2 sprays into the nose daily.      Marland Kitchen ketoconazole (NIZORAL) 2 % cream as directed.      . magnesium oxide (MAG-OX) 400 MG tablet Take 400 mg by mouth daily.      . Multiple Vitamins-Minerals (MULTIVITAMIN WITH MINERALS) tablet Take 1 tablet by mouth daily.        . pantoprazole (PROTONIX) 40 MG tablet Take 40 mg by mouth daily.       . pravastatin (PRAVACHOL) 80 MG tablet Take 80 mg by mouth daily.      Marland Kitchen warfarin (COUMADIN) 5 MG tablet Take 5-7.5 mg by mouth daily. Take 1 tablet daily, except take 1 tablets on Tuesdays and Saturdays.       Current Facility-Administered Medications  Medication Dose Route Frequency Provider Last Rate Last Dose  . 0.9 %  sodium chloride infusion  250 mL Intravenous Continuous Herby Abraham, MD      . hydrocortisone cream 1 % 1 application  1 application Topical TID PRN Herby Abraham, MD      . sodium chloride 0.9 % injection 3 mL  3 mL Intravenous Q12H Herby Abraham, MD      . sodium chloride 0.9 % injection 3 mL  3 mL Intravenous PRN Herby Abraham, MD        Allergies  Allergen Reactions  . Antihistamines, Diphenhydramine-Type Other (See Comments)    Causes difficulty in ability to urinate.    Past Medical History  Diagnosis Date  . Coronary artery disease   . Cardiomyopathy primary-nonischemic EF 45%  . Hyperlipidemia   . GERD (gastroesophageal reflux disease)   . Polymyalgia rheumatica   . Sick sinus syndrome   . Atrial fibrillation   . Dysphagia   . Hearing loss, mixed, bilateral   . Increased prostate specific antigen (PSA) velocity   . Lumbar back pain   . BPH (benign prostatic hypertrophy)   . Breast mass   . CHF (congestive heart failure)   . Umbilical hernia   . Right inguinal hernia   . Elevated liver enzymes   . Esophageal stricture   . Dyslipidemia     Past Surgical History  Procedure Date  . Cataract extraction     left  . Umbilical hernia repair   . Insert / replace / remove pacemaker   . Hernia repair 2012    [facility]  . Tonsilectomy, adenoidectomy, bilateral myringotomy and tubes   .  Cardioversion 06/17/2011    Procedure: CARDIOVERSION;  Surgeon: [name], MD;  Location: [facility] OR;  Service: Cardiovascular;  Laterality: N/A;  . Cardioversion 09/09/2011    Procedure: CARDIOVERSION;  Surgeon: Luis Abed, MD;  Location: [facility] OR;  Service: Cardiovascular;  Laterality: N/A;    Family History  Problem Relation Age of Onset  . Heart attack Father 46    deceased  . Aneurysm Mother     deceased AAA  . Hypertension Mother   . Other Brother     deceased at birth    History   Social History  . Marital Status: Married    Spouse Name: N/A    Number of Children: N/A  . Years of Education: N/A   Occupational History  . Not on file.   Social History Main Topics  . Smoking status: Former Smoker    Types: Cigarettes    Quit date: 03/10/1983  . Smokeless tobacco: Never Used  . Alcohol Use: Yes  . Drug Use: No  . Sexually Active: Not on file   Other Topics Concern  . Not on file    Social History Narrative  . No narrative on file    ROS: Please see the HPI.  All other systems reviewed and negative.  PHYSICAL EXAM:  BP 112/72  Pulse 69  Ht 5\' 7"  (1.702 m)  Wt 170 lb (77.111 kg)  BMI 26.63 kg/m2  General: Well developed, well nourished, in no acute distress. Head:  Normocephalic and atraumatic. Neck: no JVD Lungs: Clear to auscultation and percussion. Heart: Normal S1 and S2.  No murmur, rubs or gallops. Pacer site looks good.   Pulses: Pulses normal in all 4 extremities. Extremities: No clubbing or cyanosis. No edema. Neurologic: Alert and oriented x 3.  EKG:  Atrial fib.  Background pacing.   ASSESSMENT AND PLAN:

## 2012-01-01 NOTE — Progress Notes (Signed)
Cardioversion 200 joules at 1306-converted to sinus rhythm.

## 2012-01-04 ENCOUNTER — Encounter (HOSPITAL_COMMUNITY): Payer: Self-pay | Admitting: Cardiology

## 2012-01-04 ENCOUNTER — Telehealth: Payer: Self-pay | Admitting: Cardiovascular Disease

## 2012-01-04 ENCOUNTER — Ambulatory Visit (INDEPENDENT_AMBULATORY_CARE_PROVIDER_SITE_OTHER): Payer: Medicare Other

## 2012-01-04 DIAGNOSIS — I4891 Unspecified atrial fibrillation: Secondary | ICD-10-CM

## 2012-01-04 LAB — POCT INR: INR: 2.6

## 2012-01-04 NOTE — Telephone Encounter (Signed)
I spoke with the pt and he said Dr [name] told him to call me today about his Digoxin.  I made the pt aware that his digoxin level was elevated last week and we had instructed him to hold until after his DCCV.  The pt feels great today.  I instructed the pt that per Dr Rosalyn Charters instructions it looks like the pt should be resuming a lower dose of digoxin post DCCV.  I instructed the pt that he can start Digoxin 250 mcg take ONE-HALF tablet daily on 01/05/12. I will forward this message to Dr [name] for any clarification on instructions.

## 2012-01-04 NOTE — Telephone Encounter (Signed)
p was told to call lauren re his medication, pls call [phone]

## 2012-01-06 NOTE — Telephone Encounter (Signed)
That is precisely correct.  He should remain on the one half dose from before.  TS.  He needs a follow up visit.  TS

## 2012-01-08 ENCOUNTER — Other Ambulatory Visit: Payer: Self-pay | Admitting: *Deleted

## 2012-01-08 MED ORDER — ZOSTER VACCINE LIVE 19400 UNT/0.65ML ~~LOC~~ SOLR: 0.6500 mL | Freq: Once | SUBCUTANEOUS | Status: DC

## 2012-01-08 NOTE — Telephone Encounter (Signed)
The pt is scheduled to see Dr [name] on 01/12/12.

## 2012-01-12 ENCOUNTER — Ambulatory Visit (INDEPENDENT_AMBULATORY_CARE_PROVIDER_SITE_OTHER): Payer: Medicare Other | Admitting: Cardiology

## 2012-01-12 ENCOUNTER — Ambulatory Visit (INDEPENDENT_AMBULATORY_CARE_PROVIDER_SITE_OTHER): Payer: Medicare Other

## 2012-01-12 ENCOUNTER — Encounter: Payer: Self-pay | Admitting: Cardiology

## 2012-01-12 VITALS — BP 114/64 | HR 69 | Ht 67.0 in | Wt 169.0 lb

## 2012-01-12 DIAGNOSIS — I428 Other cardiomyopathies: Secondary | ICD-10-CM

## 2012-01-12 DIAGNOSIS — I4891 Unspecified atrial fibrillation: Secondary | ICD-10-CM

## 2012-01-12 DIAGNOSIS — E785 Hyperlipidemia, unspecified: Secondary | ICD-10-CM | POA: Diagnosis not present

## 2012-01-12 DIAGNOSIS — I2510 Atherosclerotic heart disease of native coronary artery without angina pectoris: Secondary | ICD-10-CM

## 2012-01-12 NOTE — Assessment & Plan Note (Signed)
Should improved with NSR.  Will readjust meds at next OV and check liver functions.

## 2012-01-12 NOTE — Progress Notes (Signed)
HPI:  The patient feels better.  He thinks he has been in sinus rhythm.  No chest pain.    Current Outpatient Prescriptions  Medication Sig Dispense Refill  . amiodarone (PACERONE) 200 MG tablet Take 1 tablet (200 mg total) by mouth daily.  90 tablet  3  . carvedilol (COREG) 12.5 MG tablet Take one and one-half tablet by mouth twice a day  270 tablet  3  . digoxin (LANOXIN) 0.25 MG tablet Take 0.5 tablets (125 mcg total) by mouth daily.      . finasteride (PROSCAR) 5 MG tablet Take one tab daily      . fluticasone (FLONASE) 50 MCG/ACT nasal spray Place 2 sprays into the nose daily.      Marland Kitchen ketoconazole (NIZORAL) 2 % cream as directed.      . magnesium oxide (MAG-OX) 400 MG tablet Take 400 mg by mouth daily.      . Multiple Vitamins-Minerals (MULTIVITAMIN WITH MINERALS) tablet Take 1 tablet by mouth daily.        . pantoprazole (PROTONIX) 40 MG tablet Take 40 mg by mouth daily.       . pravastatin (PRAVACHOL) 80 MG tablet Take 80 mg by mouth daily.      Marland Kitchen warfarin (COUMADIN) 5 MG tablet Take 5 mg by mouth as directed. Take as directed by anticoagulation clinic       Current Facility-Administered Medications  Medication Dose Route Frequency Provider Last Rate Last Dose  . hydrocortisone cream 1 % 1 application  1 application Topical TID PRN Herby Abraham, MD      . sodium chloride 0.9 % injection 3 mL  3 mL Intravenous PRN Herby Abraham, MD      . [DISCONTINUED] 0.9 %  sodium chloride infusion  250 mL Intravenous Continuous Herby Abraham, MD      . [DISCONTINUED] sodium chloride 0.9 % injection 3 mL  3 mL Intravenous Q12H Herby Abraham, MD        Allergies  Allergen Reactions  . Antihistamines, Diphenhydramine-Type Other (See Comments)    Causes difficulty in ability to urinate.    Past Medical History  Diagnosis Date  . Coronary artery disease   . Cardiomyopathy primary-nonischemic EF 45%  . Hyperlipidemia   . GERD (gastroesophageal reflux disease)   . Polymyalgia  rheumatica   . Sick sinus syndrome   . Atrial fibrillation   . Dysphagia   . Hearing loss, mixed, bilateral   . Increased prostate specific antigen (PSA) velocity   . Lumbar back pain   . BPH (benign prostatic hypertrophy)   . Breast mass   . CHF (congestive heart failure)   . Umbilical hernia   . Right inguinal hernia   . Elevated liver enzymes   . Esophageal stricture   . Dyslipidemia     Past Surgical History  Procedure Date  . Cataract extraction     left  . Umbilical hernia repair   . Insert / replace / remove pacemaker   . Hernia repair 2012    [facility]  . Tonsilectomy, adenoidectomy, bilateral myringotomy and tubes   . Cardioversion 06/17/2011    Procedure: CARDIOVERSION;  Surgeon: [name], MD;  Location: [facility] OR;  Service: Cardiovascular;  Laterality: N/A;  . Cardioversion 09/09/2011    Procedure: CARDIOVERSION;  Surgeon: Luis Abed, MD;  Location: [facility] OR;  Service: Cardiovascular;  Laterality: N/A;  . Cardioversion 01/01/2012    Procedure: CARDIOVERSION;  Surgeon: Herby Abraham,  MD;  Location: [facility] ENDOSCOPY;  Service: Cardiovascular;  Laterality: N/A;  . Tee without cardioversion 01/01/2012    Procedure: TRANSESOPHAGEAL ECHOCARDIOGRAM (TEE);  Surgeon: Pricilla Riffle, MD;  Location: [facility] ENDOSCOPY;  Service: Cardiovascular;  Laterality: N/A;    Family History  Problem Relation Age of Onset  . Heart attack Father 72    deceased  . Aneurysm Mother     deceased AAA  . Hypertension Mother   . Other Brother     deceased at birth    History   Social History  . Marital Status: Married    Spouse Name: N/A    Number of Children: N/A  . Years of Education: N/A   Occupational History  . Not on file.   Social History Main Topics  . Smoking status: Former Smoker    Types: Cigarettes    Quit date: 03/10/1983  . Smokeless tobacco: Never Used  . Alcohol Use: Yes  . Drug Use: No  . Sexually Active: Not on file   Other Topics Concern  . Not on file   Social  History Narrative  . No narrative on file    ROS: Please see the HPI.  All other systems reviewed and negative.  PHYSICAL EXAM:  BP 114/64  Pulse 69  Ht 5\' 7"  (1.702 m)  Wt 169 lb (76.658 kg)  BMI 26.47 kg/m2  SpO2 95%  General: Well developed, well nourished, in no acute distress. Head:  Normocephalic and atraumatic. Neck: no JVD Lungs: Clear to auscultation and percussion. Heart: Normal S1 and S2.  No murmur, rubs or gallops.  Extremities: No clubbing or cyanosis. No edema. Neurologic: Alert and oriented x 3.  EKG:  Atrial pacing v tracking.  Delay in R wave progression.  Non specific T flattening.    ASSESSMENT AND PLAN:

## 2012-01-12 NOTE — Patient Instructions (Addendum)
Your physician recommends that you schedule a follow-up appointment in: 4 WEEKS  Your physician recommends that you continue on your current medications as directed. Please refer to the Current Medication list given to you today.   

## 2012-01-12 NOTE — Assessment & Plan Note (Signed)
Resumption of NSR.  EF at time of TEE was depressed emphasizing the need for NSR.  Will likely dc dig at next visit.  Now on 0.125mg .  Will continue current meds.

## 2012-01-25 DIAGNOSIS — Z125 Encounter for screening for malignant neoplasm of prostate: Secondary | ICD-10-CM | POA: Diagnosis not present

## 2012-01-25 DIAGNOSIS — R351 Nocturia: Secondary | ICD-10-CM | POA: Diagnosis not present

## 2012-01-25 DIAGNOSIS — N400 Enlarged prostate without lower urinary tract symptoms: Secondary | ICD-10-CM | POA: Diagnosis not present

## 2012-01-25 NOTE — Assessment & Plan Note (Signed)
LDL at target last visit.

## 2012-01-25 NOTE — Assessment & Plan Note (Signed)
No recurrent symptoms.

## 2012-01-26 ENCOUNTER — Ambulatory Visit (INDEPENDENT_AMBULATORY_CARE_PROVIDER_SITE_OTHER): Payer: Medicare Other | Admitting: *Deleted

## 2012-01-26 ENCOUNTER — Ambulatory Visit (INDEPENDENT_AMBULATORY_CARE_PROVIDER_SITE_OTHER): Payer: Medicare Other | Admitting: Internal Medicine

## 2012-01-26 ENCOUNTER — Encounter: Payer: Self-pay | Admitting: Internal Medicine

## 2012-01-26 VITALS — Wt 165.0 lb

## 2012-01-26 DIAGNOSIS — I4891 Unspecified atrial fibrillation: Secondary | ICD-10-CM | POA: Diagnosis not present

## 2012-01-26 DIAGNOSIS — Z950 Presence of cardiac pacemaker: Secondary | ICD-10-CM | POA: Diagnosis not present

## 2012-01-26 LAB — PACEMAKER DEVICE OBSERVATION
AL IMPEDENCE PM: 392 Ohm
AL THRESHOLD: 0.5 V
ATRIAL PACING PM: 99
BAMS-0001: 150 {beats}/min
BAMS-0003: 70 {beats}/min
DEVICE MODEL PM: 1593372
RV LEAD AMPLITUDE: 11.6 mv
RV LEAD THRESHOLD: 0.75 V

## 2012-01-26 LAB — POCT INR: INR: 3.6

## 2012-01-26 NOTE — Assessment & Plan Note (Signed)
His pacemaker is working normally. We'll plan to recheck in several months. 

## 2012-01-26 NOTE — Progress Notes (Signed)
HPI [name] returns today for followup. He is a very pleasant 78 year old man with a history of paroxysmal atrial fibrillation. When I saw the patient last, his atrial fibrillation had become persistent despite being on that light. The patient carried a history of questionable liver function abnormality on amiodarone previously. He was begun on low-dose amiodarone, and underwent DC cardioversion. He returns today for followup. In the last 3 weeks, he is maintaining sinus rhythm. He feels well. His heart failure symptoms are much improved. Allergies  Allergen Reactions  . Antihistamines, Diphenhydramine-Type Other (See Comments)    Causes difficulty in ability to urinate.     Current Outpatient Prescriptions  Medication Sig Dispense Refill  . amiodarone (PACERONE) 200 MG tablet Take 1 tablet (200 mg total) by mouth daily.  90 tablet  3  . carvedilol (COREG) 12.5 MG tablet Take one  tablet by mouth twice a day      . digoxin (LANOXIN) 0.25 MG tablet Take 0.5 tablets (125 mcg total) by mouth daily.      . finasteride (PROSCAR) 5 MG tablet Take one tab daily      . fluticasone (FLONASE) 50 MCG/ACT nasal spray Place 2 sprays into the nose daily.      Marland Kitchen ketoconazole (NIZORAL) 2 % cream as directed.      . magnesium oxide (MAG-OX) 400 MG tablet Take 400 mg by mouth daily.      . Multiple Vitamins-Minerals (MULTIVITAMIN WITH MINERALS) tablet Take 1 tablet by mouth daily.        . pantoprazole (PROTONIX) 40 MG tablet Take 40 mg by mouth. Take 1 tab four times a week      . pravastatin (PRAVACHOL) 80 MG tablet Take 80 mg by mouth daily.      Marland Kitchen warfarin (COUMADIN) 5 MG tablet Take 5 mg by mouth as directed. Take as directed by anticoagulation clinic      . [DISCONTINUED] carvedilol (COREG) 12.5 MG tablet Take one and one-half tablet by mouth twice a day  270 tablet  3   Current Facility-Administered Medications  Medication Dose Route Frequency Provider Last Rate Last Dose  . hydrocortisone cream  1 % 1 application  1 application Topical TID PRN Herby Abraham, MD      . sodium chloride 0.9 % injection 3 mL  3 mL Intravenous PRN Herby Abraham, MD         Past Medical History  Diagnosis Date  . Coronary artery disease   . Cardiomyopathy primary-nonischemic EF 45%  . Hyperlipidemia   . GERD (gastroesophageal reflux disease)   . Polymyalgia rheumatica   . Sick sinus syndrome   . Atrial fibrillation   . Dysphagia   . Hearing loss, mixed, bilateral   . Increased prostate specific antigen (PSA) velocity   . Lumbar back pain   . BPH (benign prostatic hypertrophy)   . Breast mass   . CHF (congestive heart failure)   . Umbilical hernia   . Right inguinal hernia   . Elevated liver enzymes   . Esophageal stricture   . Dyslipidemia     ROS:   All systems reviewed and negative except as noted in the HPI.   Past Surgical History  Procedure Date  . Cataract extraction     left  . Umbilical hernia repair   . Insert / replace / remove pacemaker   . Hernia repair 2012    [facility]  . Tonsilectomy, adenoidectomy, bilateral myringotomy and tubes   .  Cardioversion 06/17/2011    Procedure: CARDIOVERSION;  Surgeon: [name], MD;  Location: [facility] OR;  Service: Cardiovascular;  Laterality: N/A;  . Cardioversion 09/09/2011    Procedure: CARDIOVERSION;  Surgeon: Luis Abed, MD;  Location: [facility] OR;  Service: Cardiovascular;  Laterality: N/A;  . Cardioversion 01/01/2012    Procedure: CARDIOVERSION;  Surgeon: Herby Abraham, MD;  Location: [facility] ENDOSCOPY;  Service: Cardiovascular;  Laterality: N/A;  . Tee without cardioversion 01/01/2012    Procedure: TRANSESOPHAGEAL ECHOCARDIOGRAM (TEE);  Surgeon: Pricilla Riffle, MD;  Location: [facility] ENDOSCOPY;  Service: Cardiovascular;  Laterality: N/A;     Family History  Problem Relation Age of Onset  . Heart attack Father 69    deceased  . Aneurysm Mother     deceased AAA  . Hypertension Mother   . Other Brother     deceased at birth      History   Social History  . Marital Status: Married    Spouse Name: N/A    Number of Children: N/A  . Years of Education: N/A   Occupational History  . Not on file.   Social History Main Topics  . Smoking status: Former Smoker    Types: Cigarettes    Quit date: 03/10/1983  . Smokeless tobacco: Never Used  . Alcohol Use: Yes  . Drug Use: No  . Sexually Active: Not on file   Other Topics Concern  . Not on file   Social History Narrative  . No narrative on file     Wt 165 lb (74.844 kg)  Physical Exam:  Well appearing NAD HEENT: Unremarkable Neck:  No JVD, no thyromegally Lungs:  Clear with no wheezes, rales, or rhonchi HEART:  Regular rate rhythm, no murmurs, no rubs, no clicks Abd:  soft, positive bowel sounds, no organomegally, no rebound, no guarding Ext:  2 plus pulses, no edema, no cyanosis, no clubbing Skin:  No rashes no nodules Neuro:  CN II through XII intact, motor grossly intact  DEVICE  Normal device function.  See PaceArt for details. Underlying rhythm is sinus.   Assess/Plan:

## 2012-01-26 NOTE — Assessment & Plan Note (Signed)
He is maintaining NSR. He'll continue his current dose of amiodarone. No change in medications. I'll see him back in 3 months. At that time we'll plan a liver panel and TSH.

## 2012-01-26 NOTE — Patient Instructions (Addendum)
Your physician recommends that you schedule a follow-up appointment in: 3 months with Dr Charleston Ropes every 3 months   Your physician recommends that you return for lab work in 3 months---same day as appointment with Dr Ladona Ridgel

## 2012-02-10 ENCOUNTER — Ambulatory Visit (INDEPENDENT_AMBULATORY_CARE_PROVIDER_SITE_OTHER): Payer: Medicare Other | Admitting: Cardiology

## 2012-02-10 ENCOUNTER — Ambulatory Visit (INDEPENDENT_AMBULATORY_CARE_PROVIDER_SITE_OTHER): Payer: Medicare Other | Admitting: *Deleted

## 2012-02-10 ENCOUNTER — Encounter: Payer: Self-pay | Admitting: Cardiology

## 2012-02-10 VITALS — BP 108/64 | HR 70 | Ht 67.0 in | Wt 170.0 lb

## 2012-02-10 DIAGNOSIS — I4891 Unspecified atrial fibrillation: Secondary | ICD-10-CM

## 2012-02-10 DIAGNOSIS — I2510 Atherosclerotic heart disease of native coronary artery without angina pectoris: Secondary | ICD-10-CM | POA: Diagnosis not present

## 2012-02-10 DIAGNOSIS — Z950 Presence of cardiac pacemaker: Secondary | ICD-10-CM | POA: Diagnosis not present

## 2012-02-10 DIAGNOSIS — E785 Hyperlipidemia, unspecified: Secondary | ICD-10-CM

## 2012-02-10 DIAGNOSIS — I428 Other cardiomyopathies: Secondary | ICD-10-CM

## 2012-02-10 LAB — HEPATIC FUNCTION PANEL
AST: 30 U/L (ref 0–37)
Total Bilirubin: 0.8 mg/dL (ref 0.3–1.2)

## 2012-02-10 LAB — TSH: TSH: 2.08 u[IU]/mL (ref 0.35–5.50)

## 2012-02-10 NOTE — Assessment & Plan Note (Signed)
He currently is maintaining normal sinus rhythm on amiodarone. We talked about alternatives. We will recheck his liver functions, and also recheck his thyroid function the present time. I will see him back in followup in 6 weeks to 2 months.

## 2012-02-10 NOTE — Patient Instructions (Addendum)
Your physician recommends that you have lab work today: LIVER and TSH  Your physician recommends that you schedule a follow-up appointment in: 6 WEEKS  Your physician recommends that you continue on your current medications as directed. Please refer to the Current Medication list given to you today.

## 2012-02-10 NOTE — Assessment & Plan Note (Signed)
The last ejection fraction was 40-45%, but is always worse when he is out of rhythm.

## 2012-02-10 NOTE — Assessment & Plan Note (Signed)
The patient tolerates pravastatin relatively well.

## 2012-02-10 NOTE — Assessment & Plan Note (Signed)
He's had no ischemic symptoms.

## 2012-02-10 NOTE — Progress Notes (Signed)
HPI:  Patient seen today in a followup visit. From a cardiac standpoint he really is doing quite well. He's been back in normal sinus rhythm. We've yet to check the fax of amiodarone his liver and thyroid function. He's not had any other symptoms to begin side effects from this. He denies any chest pain  Current Outpatient Prescriptions  Medication Sig Dispense Refill  . amiodarone (PACERONE) 200 MG tablet Take 1 tablet (200 mg total) by mouth daily.  90 tablet  3  . carvedilol (COREG) 12.5 MG tablet Take one  tablet by mouth twice a day      . digoxin (LANOXIN) 0.25 MG tablet Take 0.5 tablets (125 mcg total) by mouth daily.      . finasteride (PROSCAR) 5 MG tablet Take one tab daily      . fluticasone (FLONASE) 50 MCG/ACT nasal spray Place 2 sprays into the nose daily.      . magnesium oxide (MAG-OX) 400 MG tablet Take 400 mg by mouth daily.      . Multiple Vitamins-Minerals (MULTIVITAMIN WITH MINERALS) tablet Take 1 tablet by mouth daily.        . pantoprazole (PROTONIX) 40 MG tablet Take 40 mg by mouth. Take 1 tab four times a week      . pravastatin (PRAVACHOL) 80 MG tablet Take 80 mg by mouth daily.      Marland Kitchen warfarin (COUMADIN) 5 MG tablet Take 5 mg by mouth as directed. Take as directed by anticoagulation clinic       Current Facility-Administered Medications  Medication Dose Route Frequency Provider Last Rate Last Dose  . [DISCONTINUED] hydrocortisone cream 1 % 1 application  1 application Topical TID PRN Herby Abraham, MD      . [DISCONTINUED] sodium chloride 0.9 % injection 3 mL  3 mL Intravenous PRN Herby Abraham, MD        Allergies  Allergen Reactions  . Antihistamines, Diphenhydramine-Type Other (See Comments)    Causes difficulty in ability to urinate.    Past Medical History  Diagnosis Date  . Coronary artery disease   . Cardiomyopathy primary-nonischemic EF 45%  . Hyperlipidemia   . GERD (gastroesophageal reflux disease)   . Polymyalgia rheumatica   . Sick  sinus syndrome   . Atrial fibrillation   . Dysphagia   . Hearing loss, mixed, bilateral   . Increased prostate specific antigen (PSA) velocity   . Lumbar back pain   . BPH (benign prostatic hypertrophy)   . Breast mass   . CHF (congestive heart failure)   . Umbilical hernia   . Right inguinal hernia   . Elevated liver enzymes   . Esophageal stricture   . Dyslipidemia     Past Surgical History  Procedure Date  . Cataract extraction     left  . Umbilical hernia repair   . Insert / replace / remove pacemaker   . Hernia repair 2012    [facility]  . Tonsilectomy, adenoidectomy, bilateral myringotomy and tubes   . Cardioversion 06/17/2011    Procedure: CARDIOVERSION;  Surgeon: [name], MD;  Location: [facility] OR;  Service: Cardiovascular;  Laterality: N/A;  . Cardioversion 09/09/2011    Procedure: CARDIOVERSION;  Surgeon: Luis Abed, MD;  Location: [facility] OR;  Service: Cardiovascular;  Laterality: N/A;  . Cardioversion 01/01/2012    Procedure: CARDIOVERSION;  Surgeon: Herby Abraham, MD;  Location: [facility] ENDOSCOPY;  Service: Cardiovascular;  Laterality: N/A;  . Tee without cardioversion 01/01/2012  Procedure: TRANSESOPHAGEAL ECHOCARDIOGRAM (TEE);  Surgeon: Pricilla Riffle, MD;  Location: [facility] ENDOSCOPY;  Service: Cardiovascular;  Laterality: N/A;    Family History  Problem Relation Age of Onset  . Heart attack Father 31    deceased  . Aneurysm Mother     deceased AAA  . Hypertension Mother   . Other Brother     deceased at birth    History   Social History  . Marital Status: Married    Spouse Name: N/A    Number of Children: N/A  . Years of Education: N/A   Occupational History  . Not on file.   Social History Main Topics  . Smoking status: Former Smoker    Types: Cigarettes    Quit date: 03/10/1983  . Smokeless tobacco: Never Used  . Alcohol Use: Yes  . Drug Use: No  . Sexually Active: Not on file   Other Topics Concern  . Not on file   Social History Narrative    . No narrative on file    ROS: Please see the HPI.  All other systems reviewed and negative.  PHYSICAL EXAM:  BP 108/64  Pulse 70  Ht 5\' 7"  (1.702 m)  Wt 170 lb (77.111 kg)  BMI 26.63 kg/m2  SpO2 95%  General: Well developed, well nourished, in no acute distress. Head:  Normocephalic and atraumatic. Neck: no JVD Lungs: Clear to auscultation and percussion. Heart: Normal S1 and S2.  No murmur, rubs or gallops.  Pulses: Pulses normal in all 4 extremities. Extremities: No clubbing or cyanosis. No edema. Neurologic: Alert and oriented x 3.  EKG:  Atrial pacing.  RBBB with LAFB.    ASSESSMENT AND PLAN:

## 2012-02-22 ENCOUNTER — Encounter: Payer: Self-pay | Admitting: Internal Medicine

## 2012-02-22 ENCOUNTER — Ambulatory Visit (INDEPENDENT_AMBULATORY_CARE_PROVIDER_SITE_OTHER): Payer: Medicare Other | Admitting: Internal Medicine

## 2012-02-22 VITALS — BP 118/74 | HR 72 | Temp 98.1°F | Wt 171.0 lb

## 2012-02-22 DIAGNOSIS — M353 Polymyalgia rheumatica: Secondary | ICD-10-CM

## 2012-02-23 NOTE — Progress Notes (Signed)
Patient ID: [name], male   DOB: June 22, 1929, 78 y.o.   MRN: [id number]  Scanned progress note due to unexpected downtime

## 2012-03-07 ENCOUNTER — Ambulatory Visit (INDEPENDENT_AMBULATORY_CARE_PROVIDER_SITE_OTHER): Payer: Medicare Other | Admitting: *Deleted

## 2012-03-07 DIAGNOSIS — I4891 Unspecified atrial fibrillation: Secondary | ICD-10-CM

## 2012-03-07 LAB — POCT INR: INR: 2.3

## 2012-03-10 ENCOUNTER — Other Ambulatory Visit: Payer: Self-pay | Admitting: *Deleted

## 2012-03-10 DIAGNOSIS — I4891 Unspecified atrial fibrillation: Secondary | ICD-10-CM

## 2012-03-10 MED ORDER — AMIODARONE HCL 200 MG PO TABS: 200.0000 mg | ORAL_TABLET | Freq: Every day | ORAL | Status: DC

## 2012-03-10 MED ORDER — DIGOXIN 250 MCG PO TABS: 125.0000 ug | ORAL_TABLET | Freq: Every day | ORAL | Status: DC

## 2012-03-10 MED ORDER — PRAVASTATIN SODIUM 80 MG PO TABS: 80.0000 mg | ORAL_TABLET | Freq: Every day | ORAL | Status: DC

## 2012-03-10 MED ORDER — WARFARIN SODIUM 5 MG PO TABS: 5.0000 mg | ORAL_TABLET | ORAL | Status: DC

## 2012-03-10 MED ORDER — PANTOPRAZOLE SODIUM 40 MG PO TBEC: 40.0000 mg | DELAYED_RELEASE_TABLET | Freq: Every day | ORAL | Status: DC

## 2012-03-10 MED ORDER — CARVEDILOL 12.5 MG PO TABS: 12.5000 mg | ORAL_TABLET | Freq: Two times a day (BID) | ORAL | Status: DC

## 2012-03-16 ENCOUNTER — Encounter: Payer: Self-pay | Admitting: Internal Medicine

## 2012-03-16 DIAGNOSIS — I495 Sick sinus syndrome: Secondary | ICD-10-CM | POA: Diagnosis not present

## 2012-03-31 ENCOUNTER — Encounter: Payer: Self-pay | Admitting: Cardiology

## 2012-03-31 ENCOUNTER — Ambulatory Visit (INDEPENDENT_AMBULATORY_CARE_PROVIDER_SITE_OTHER): Payer: Medicare Other | Admitting: *Deleted

## 2012-03-31 ENCOUNTER — Ambulatory Visit (INDEPENDENT_AMBULATORY_CARE_PROVIDER_SITE_OTHER): Payer: Medicare Other | Admitting: Cardiology

## 2012-03-31 VITALS — BP 110/72 | HR 69 | Ht 67.0 in | Wt 167.8 lb

## 2012-03-31 DIAGNOSIS — E785 Hyperlipidemia, unspecified: Secondary | ICD-10-CM

## 2012-03-31 DIAGNOSIS — I4891 Unspecified atrial fibrillation: Secondary | ICD-10-CM | POA: Diagnosis not present

## 2012-03-31 DIAGNOSIS — I428 Other cardiomyopathies: Secondary | ICD-10-CM | POA: Diagnosis not present

## 2012-03-31 DIAGNOSIS — I2510 Atherosclerotic heart disease of native coronary artery without angina pectoris: Secondary | ICD-10-CM | POA: Diagnosis not present

## 2012-03-31 LAB — BASIC METABOLIC PANEL
BUN: 19 mg/dL (ref 6–23)
CO2: 31 mEq/L (ref 19–32)
Chloride: 101 mEq/L (ref 96–112)
Glucose, Bld: 114 mg/dL — ABNORMAL HIGH (ref 70–99)
Potassium: 4.2 mEq/L (ref 3.5–5.1)

## 2012-03-31 LAB — HEPATIC FUNCTION PANEL
ALT: 36 U/L (ref 0–53)
AST: 32 U/L (ref 0–37)
Albumin: 3.8 g/dL (ref 3.5–5.2)
Total Bilirubin: 0.9 mg/dL (ref 0.3–1.2)
Total Protein: 7 g/dL (ref 6.0–8.3)

## 2012-03-31 LAB — T4, FREE: Free T4: 0.97 ng/dL (ref 0.60–1.60)

## 2012-03-31 NOTE — Patient Instructions (Addendum)
Your physician recommends that you have lab work today. BMP, TSH, Free T4, LFT.  We will call you with your results  Follow-up with Dr. [name] in 2 months  Continue taking your current medications as directed

## 2012-03-31 NOTE — Progress Notes (Signed)
HPI:  The patient continues to get along exceedingly well. He denies any ongoing chest pain presently. He does not feel like he is been out of rhythm as well. He is tolerated amiodarone without difficulty, and we need to obtain laboratory studies today as he had previous elevation of his hepatic enzymes. He almost always uniformly knows when he is out of rhythm, and he certainly doesn't feel that way at this point in time.  He does notice a little target his pacemaker site, and this seems to be worse when he is raising his arms up over his head. He does a few exercises where he hangs, and he notes that this seems to make it worse. He has seen Dr. Ladona Ridgel in the past about this. There has not been significant erythema at the pacer site  Current Outpatient Prescriptions  Medication Sig Dispense Refill  . amiodarone (PACERONE) 200 MG tablet Take 1 tablet (200 mg total) by mouth daily.  90 tablet  3  . carvedilol (COREG) 12.5 MG tablet Take 1 tablet (12.5 mg total) by mouth 2 (two) times daily with a meal. Take one  tablet by mouth twice a day  180 tablet  3  . digoxin (LANOXIN) 0.25 MG tablet Take 0.5 tablets (125 mcg total) by mouth daily.  90 tablet  3  . finasteride (PROSCAR) 5 MG tablet Take one tab daily      . fluticasone (FLONASE) 50 MCG/ACT nasal spray Place 2 sprays into the nose daily.      . magnesium oxide (MAG-OX) 400 MG tablet Take 400 mg by mouth daily.      . Multiple Vitamins-Minerals (MULTIVITAMIN WITH MINERALS) tablet Take 1 tablet by mouth daily.        . pantoprazole (PROTONIX) 40 MG tablet Take 1 tablet (40 mg total) by mouth daily.  90 tablet  3  . pravastatin (PRAVACHOL) 80 MG tablet Take 1 tablet (80 mg total) by mouth daily.  90 tablet  3  . warfarin (COUMADIN) 5 MG tablet Take 1 tablet (5 mg total) by mouth as directed. Take as directed by anticoagulation clinic  90 tablet  3    Allergies  Allergen Reactions  . Antihistamines, Diphenhydramine-Type Other (See Comments)   Causes difficulty in ability to urinate.    Past Medical History  Diagnosis Date  . Coronary artery disease   . Cardiomyopathy primary-nonischemic EF 45%  . Hyperlipidemia   . GERD (gastroesophageal reflux disease)   . Polymyalgia rheumatica   . Sick sinus syndrome   . Atrial fibrillation   . Dysphagia   . Hearing loss, mixed, bilateral   . Increased prostate specific antigen (PSA) velocity   . Lumbar back pain   . BPH (benign prostatic hypertrophy)   . Breast mass   . CHF (congestive heart failure)   . Umbilical hernia   . Right inguinal hernia   . Elevated liver enzymes   . Esophageal stricture   . Dyslipidemia     Past Surgical History  Procedure Date  . Cataract extraction     left  . Umbilical hernia repair   . Insert / replace / remove pacemaker   . Hernia repair 2012    [facility]  . Tonsilectomy, adenoidectomy, bilateral myringotomy and tubes   . Cardioversion 06/17/2011    Procedure: CARDIOVERSION;  Surgeon: [name], MD;  Location: [facility] OR;  Service: Cardiovascular;  Laterality: N/A;  . Cardioversion 09/09/2011    Procedure: CARDIOVERSION;  Surgeon: Duane Lope  Myrtis Ser, MD;  Location: [facility] OR;  Service: Cardiovascular;  Laterality: N/A;  . Cardioversion 01/01/2012    Procedure: CARDIOVERSION;  Surgeon: Herby Abraham, MD;  Location: [facility] ENDOSCOPY;  Service: Cardiovascular;  Laterality: N/A;  . Tee without cardioversion 01/01/2012    Procedure: TRANSESOPHAGEAL ECHOCARDIOGRAM (TEE);  Surgeon: Pricilla Riffle, MD;  Location: [facility] ENDOSCOPY;  Service: Cardiovascular;  Laterality: N/A;    Family History  Problem Relation Age of Onset  . Heart attack Father 29    deceased  . Aneurysm Mother     deceased AAA  . Hypertension Mother   . Other Brother     deceased at birth    History   Social History  . Marital Status: Married    Spouse Name: N/A    Number of Children: N/A  . Years of Education: N/A   Occupational History  . Not on file.   Social History Main  Topics  . Smoking status: Former Smoker    Types: Cigarettes    Quit date: 03/10/1983  . Smokeless tobacco: Never Used  . Alcohol Use: Yes  . Drug Use: No  . Sexually Active: Not on file   Other Topics Concern  . Not on file   Social History Narrative  . No narrative on file    ROS: Please see the HPI.  All other systems reviewed and negative.  PHYSICAL EXAM:  BP 110/72  Pulse 69  Ht 5\' 7"  (1.702 m)  Wt 167 lb 12.8 oz (76.114 kg)  BMI 26.28 kg/m2  General: Well developed, well nourished, in no acute distress. Head:  Normocephalic and atraumatic. Neck: no JVD Chest:   Pacer site prominent, but not tense, nor eroded.   Lungs: Clear to auscultation and percussion. Heart: Normal S1 and S2.  1/6 SEM.  No DM.   Pulses: Pulses normal in all 4 extremities. Extremities: No clubbing or cyanosis. No edema. Neurologic: Alert and oriented x 3.  EKG:  AV pacing.    ASSESSMENT AND PLAN:

## 2012-04-04 NOTE — Assessment & Plan Note (Signed)
No current symptoms

## 2012-04-04 NOTE — Assessment & Plan Note (Signed)
Seems to be maintaining NSR.  Continue to monitor and get amio labs.  This will need to be watched closely going forward.

## 2012-04-04 NOTE — Assessment & Plan Note (Addendum)
Chronic in nature.  Continue to monitor.  Will likely ned a repeat echo in a year.

## 2012-04-04 NOTE — Assessment & Plan Note (Signed)
Good control on this moderate dose therapy.

## 2012-04-23 ENCOUNTER — Other Ambulatory Visit: Payer: Self-pay

## 2012-04-24 ENCOUNTER — Other Ambulatory Visit: Payer: Self-pay | Admitting: Internal Medicine

## 2012-04-25 ENCOUNTER — Ambulatory Visit (INDEPENDENT_AMBULATORY_CARE_PROVIDER_SITE_OTHER): Payer: Medicare Other | Admitting: *Deleted

## 2012-04-25 DIAGNOSIS — I4891 Unspecified atrial fibrillation: Secondary | ICD-10-CM | POA: Diagnosis not present

## 2012-04-25 LAB — POCT INR: INR: 2.2

## 2012-05-27 ENCOUNTER — Ambulatory Visit (INDEPENDENT_AMBULATORY_CARE_PROVIDER_SITE_OTHER): Payer: Medicare Other | Admitting: *Deleted

## 2012-05-27 ENCOUNTER — Ambulatory Visit (INDEPENDENT_AMBULATORY_CARE_PROVIDER_SITE_OTHER): Payer: Medicare Other | Admitting: Cardiology

## 2012-05-27 ENCOUNTER — Encounter: Payer: Self-pay | Admitting: Cardiology

## 2012-05-27 ENCOUNTER — Ambulatory Visit (INDEPENDENT_AMBULATORY_CARE_PROVIDER_SITE_OTHER): Payer: Medicare Other

## 2012-05-27 ENCOUNTER — Telehealth: Payer: Self-pay | Admitting: Cardiology

## 2012-05-27 ENCOUNTER — Ambulatory Visit (INDEPENDENT_AMBULATORY_CARE_PROVIDER_SITE_OTHER)
Admission: RE | Admit: 2012-05-27 | Discharge: 2012-05-27 | Disposition: A | Discharge status: Home / Self Care | Payer: Medicare Other | Source: Ambulatory Visit | Attending: Cardiology | Admitting: Cardiology

## 2012-05-27 VITALS — BP 118/64 | HR 70 | Ht 67.0 in | Wt 168.0 lb

## 2012-05-27 DIAGNOSIS — I2510 Atherosclerotic heart disease of native coronary artery without angina pectoris: Secondary | ICD-10-CM

## 2012-05-27 DIAGNOSIS — I4891 Unspecified atrial fibrillation: Secondary | ICD-10-CM

## 2012-05-27 DIAGNOSIS — I495 Sick sinus syndrome: Secondary | ICD-10-CM | POA: Diagnosis not present

## 2012-05-27 DIAGNOSIS — I428 Other cardiomyopathies: Secondary | ICD-10-CM

## 2012-05-27 LAB — TSH: TSH: 3.44 u[IU]/mL (ref 0.35–5.50)

## 2012-05-27 LAB — POCT INR: INR: 2

## 2012-05-27 NOTE — Patient Instructions (Addendum)
Your physician recommends that you return for lab work this afternoon: Digoxin level, Liver and TSH  Your physician has recommended that you have a pulmonary function test. Pulmonary Function Tests are a group of tests that measure how well air moves in and out of your lungs.  Your physician recommends that you schedule a follow-up appointment in: 2 MONTHS with Dr Shirlee Latch (previous pt of Dr [name])  A chest x-ray takes a picture of the organs and structures inside the chest, including the heart, lungs, and blood vessels. This test can show several things, including, whether the heart is enlarges; whether fluid is building up in the lungs; and whether pacemaker / defibrillator leads are still in place.

## 2012-05-27 NOTE — Telephone Encounter (Signed)
New Problem:    Patient called in wanting to update you on the current medications he is taking, mistakenly provided the wrong doses earlier.  Please call back.

## 2012-05-27 NOTE — Progress Notes (Signed)
HPI:  The patient is doing well, and probably better than he is on a long time on amiodarone. He denies any chest pain, and he denies any symptomatic. Issues related to the medication use. We need to check his liver functions, and he will need to be needing pulmonary function testing as well. We're in the process of transitioning his care to Dr. Jearld Pies  for long-term follow up.  We reviewed in detail the issues associated with long term amiodarone use, and potentials.  He has been chronically aware of my concern for this.  No chest pain.    Current Outpatient Prescriptions  Medication Sig Dispense Refill  . amiodarone (PACERONE) 200 MG tablet Take 1 tablet (200 mg total) by mouth daily.  90 tablet  3  . carvedilol (COREG) 12.5 MG tablet Take 1 tablet (12.5 mg total) by mouth 2 (two) times daily with a meal. Take one  tablet by mouth twice a day  180 tablet  3  . digoxin (LANOXIN) 0.25 MG tablet Take 0.5 tablets (125 mcg total) by mouth daily.  90 tablet  3  . finasteride (PROSCAR) 5 MG tablet Take one tab daily      . fluticasone (FLONASE) 50 MCG/ACT nasal spray Place 2 sprays into the nose daily.      . magnesium oxide (MAG-OX) 400 MG tablet Take 400 mg by mouth daily.      . Multiple Vitamins-Minerals (MULTIVITAMIN WITH MINERALS) tablet Take 1 tablet by mouth daily.        . pantoprazole (PROTONIX) 40 MG tablet Take 1 tablet (40 mg total) by mouth daily.  90 tablet  3  . pravastatin (PRAVACHOL) 80 MG tablet Take 1 tablet (80 mg total) by mouth daily.  90 tablet  3  . warfarin (COUMADIN) 5 MG tablet Take 1 tablet (5 mg total) by mouth as directed. Take as directed by anticoagulation clinic  90 tablet  3   No current facility-administered medications for this visit.    Allergies  Allergen Reactions  . Antihistamines, Diphenhydramine-Type Other (See Comments)    Causes difficulty in ability to urinate.    Past Medical History  Diagnosis Date  . Coronary artery disease   .  Cardiomyopathy primary-nonischemic EF 45%  . Hyperlipidemia   . GERD (gastroesophageal reflux disease)   . Polymyalgia rheumatica   . Sick sinus syndrome   . Atrial fibrillation   . Dysphagia   . Hearing loss, mixed, bilateral   . Increased prostate specific antigen (PSA) velocity   . Lumbar back pain   . BPH (benign prostatic hypertrophy)   . Breast mass   . CHF (congestive heart failure)   . Umbilical hernia   . Right inguinal hernia   . Elevated liver enzymes   . Esophageal stricture   . Dyslipidemia     Past Surgical History  Procedure Laterality Date  . Cataract extraction      left  . Umbilical hernia repair    . Insert / replace / remove pacemaker    . Hernia repair  2012    [facility]H  . Tonsilectomy, adenoidectomy, bilateral myringotomy and tubes    . Cardioversion  06/17/2011    Procedure: CARDIOVERSION;  Surgeon: [name], MD;  Location: [facility] OR;  Service: Cardiovascular;  Laterality: N/A;  . Cardioversion  09/09/2011    Procedure: CARDIOVERSION;  Surgeon: Luis Abed, MD;  Location: [facility] OR;  Service: Cardiovascular;  Laterality: N/A;  . Cardioversion  01/01/2012  Procedure: CARDIOVERSION;  Surgeon: Herby Abraham, MD;  Location: [facility] ENDOSCOPY;  Service: Cardiovascular;  Laterality: N/A;  . Tee without cardioversion  01/01/2012    Procedure: TRANSESOPHAGEAL ECHOCARDIOGRAM (TEE);  Surgeon: Pricilla Riffle, MD;  Location: [facility] ENDOSCOPY;  Service: Cardiovascular;  Laterality: N/A;    Family History  Problem Relation Age of Onset  . Heart attack Father 52    deceased  . Aneurysm Mother     deceased AAA  . Hypertension Mother   . Other Brother     deceased at birth    History   Social History  . Marital Status: Married    Spouse Name: N/A    Number of Children: N/A  . Years of Education: N/A   Occupational History  . Not on file.   Social History Main Topics  . Smoking status: Former Smoker    Types: Cigarettes    Quit date: 03/10/1983  . Smokeless  tobacco: Never Used  . Alcohol Use: Yes  . Drug Use: No  . Sexually Active: Not on file   Other Topics Concern  . Not on file   Social History Narrative  . No narrative on file    ROS: Please see the HPI.  All other systems reviewed and negative.  PHYSICAL EXAM:  BP 118/64  Pulse 70  Ht 5\' 7"  (1.702 m)  Wt 168 lb (76.204 kg)  BMI 26.31 kg/m2  SpO2 95%  General: Well developed, well nourished, in no acute distress. Head:  Normocephalic and atraumatic. Neck: no JVD.  Pacer site ok.   Lungs: Clear to auscultation and percussion. Heart: Normal S1 and S2.  No murmur, rubs or gallops.  Abdomen:  Normal bowel sounds; soft; non tender; no organomegaly Pulses: Pulses normal in all 4 extremities. Extremities: No clubbing or cyanosis. No edema. Neurologic: Alert and oriented x 3.  EKG:  Av pacing  ASSESSMENT AND PLAN:  1.  Mild CAD 2.  Recurrent atrial fib on meds with history of elevated LFT on Amio.  Back on amio per EP.  3.  Non ischemic CM, chronic  FU 2 months.  Marland Kitchen

## 2012-05-27 NOTE — Telephone Encounter (Signed)
I spoke with the pt and he wanted to make Dr [name] aware that yesterday and today he took his correct dosage of Digoxin ( daily). But, for 7 days prior to this the pt forgot to cut his pills in half and he took daily.  I made the pt aware that this will effect the results of his Digoxin level drawn today and I will make Dr [name] aware of this information.

## 2012-05-28 LAB — DIGOXIN LEVEL: Digoxin Level: 1.2 ng/mL (ref 0.8–2.0)

## 2012-05-30 LAB — HEPATIC FUNCTION PANEL
Albumin: 4 g/dL (ref 3.5–5.2)
Total Protein: 7 g/dL (ref 6.0–8.3)

## 2012-05-30 NOTE — Assessment & Plan Note (Signed)
Paced since the mid/late 1990s.

## 2012-05-30 NOTE — Assessment & Plan Note (Signed)
Has had chronic stable non ischemic CM.  Overall has not changed in some time, but always worse during bouts with atrial fib.

## 2012-05-30 NOTE — Assessment & Plan Note (Signed)
Mild CAD at time of cath, so have been on lipid lowering therapy.  No recurrent problems.

## 2012-05-30 NOTE — Assessment & Plan Note (Signed)
Has been recurrent.  Back on treatment with Amio and seems to be holding nicely.  Will do baseline studies at this point.

## 2012-06-01 ENCOUNTER — Telehealth: Payer: Self-pay | Admitting: Internal Medicine

## 2012-06-01 MED ORDER — FLUTICASONE PROPIONATE 50 MCG/ACT NA SUSP: 2.0000 | Freq: Every day | NASAL | Status: DC

## 2012-06-01 NOTE — Telephone Encounter (Signed)
Pt requesting refills on his fluticasone (FLONASE) 50 MCG/ACT, and Patanol 1%. He's requesting that they be called into

## 2012-06-01 NOTE — Telephone Encounter (Signed)
flonase sent in electronically to [facility].  patanol not on med list.  Can you find out who prescribed this and the directions?

## 2012-06-01 NOTE — Addendum Note (Signed)
Addended by: Alfred Levins D on: 06/01/2012 02:26 PM   Modules accepted: Orders

## 2012-06-01 NOTE — Telephone Encounter (Signed)
*  closed in error* Requesting that those be called into his mail order pharmacy. Please assist.

## 2012-06-02 MED ORDER — OLOPATADINE HCL 0.1 % OP SOLN: 1.0000 [drp] | Freq: Every day | OPHTHALMIC | Status: DC | PRN

## 2012-06-02 NOTE — Telephone Encounter (Signed)
Spoke with pt, he stated that the Patanol .1% was indeed prescribed by Dr. Cato Mulligan. It is dated 02/22/12. Directions are as follow; instill 1 drop in both eyes, as needed.

## 2012-06-02 NOTE — Telephone Encounter (Signed)
rx sent in electronically 

## 2012-06-02 NOTE — Addendum Note (Signed)
Addended by: Alfred Levins D on: 06/02/2012 02:49 PM   Modules accepted: Orders

## 2012-06-09 ENCOUNTER — Telehealth: Payer: Self-pay | Admitting: Cardiology

## 2012-06-09 NOTE — Telephone Encounter (Signed)
New Prob   States pacemaker site is a little sore and reddish. Concerned and would like to speak to nurse.

## 2012-06-09 NOTE — Telephone Encounter (Deleted)
error 

## 2012-06-09 NOTE — Telephone Encounter (Signed)
I spoke with the pt and he complained of soreness at pacemaker site.  The pt has noticed that his pacemaker wires seem more prominent and that he has a reddish/blue area at the top of his pacemaker site.  The redness developed 2 days ago and looks a "little worse" today. The pt denies a fever and denies warmth at pacemaker site. I spoke with Baxter Hire in the device clinic and she will see the pt tomorrow at 9:00.  Pt aware of appointment.

## 2012-06-10 ENCOUNTER — Ambulatory Visit (INDEPENDENT_AMBULATORY_CARE_PROVIDER_SITE_OTHER): Payer: Medicare Other | Admitting: *Deleted

## 2012-06-10 DIAGNOSIS — Z950 Presence of cardiac pacemaker: Secondary | ICD-10-CM

## 2012-06-10 NOTE — Progress Notes (Signed)
Pt seen in clinic for wound check. Wound has swelling and redness. Dr Johney Frame evaluated and spoke with Dr Ladona Ridgel in regards to issue. Pt scheduled for 06-16-12 @ 845 with GT to discuss device removal.

## 2012-06-13 ENCOUNTER — Other Ambulatory Visit: Payer: Self-pay | Admitting: Internal Medicine

## 2012-06-13 ENCOUNTER — Telehealth: Payer: Self-pay | Admitting: *Deleted

## 2012-06-13 DIAGNOSIS — Z950 Presence of cardiac pacemaker: Secondary | ICD-10-CM

## 2012-06-13 NOTE — Telephone Encounter (Signed)
Patient's pacemaker system extraction scheduled for Wednesday, April 9th at 12Noon.  Pt to arrive at short stay at 10AM.  Pt to hold Coumadin starting today per Dr Johney Frame.  Case scheduled in [facility] OR with Dr Donata Clay as back up.  Patient aware and agrees with above.

## 2012-06-14 ENCOUNTER — Encounter (HOSPITAL_COMMUNITY): Payer: Self-pay | Admitting: *Deleted

## 2012-06-14 ENCOUNTER — Encounter (HOSPITAL_COMMUNITY): Payer: Self-pay | Admitting: Pharmacy Technician

## 2012-06-14 MED ORDER — SODIUM CHLORIDE 0.9 % IR SOLN
80.0000 mg | Status: DC
  Filled 2012-06-14: qty 2

## 2012-06-14 MED ORDER — CEFAZOLIN SODIUM-DEXTROSE 2-3 GM-% IV SOLR
2.0000 g | INTRAVENOUS | Status: DC
  Filled 2012-06-14: qty 50

## 2012-06-15 ENCOUNTER — Encounter (HOSPITAL_COMMUNITY)
Admission: RE | Disposition: A | Discharge status: Home / Self Care | Payer: Self-pay | Source: Ambulatory Visit | Attending: Internal Medicine

## 2012-06-15 ENCOUNTER — Encounter (HOSPITAL_COMMUNITY): Payer: Self-pay | Admitting: Anesthesiology

## 2012-06-15 ENCOUNTER — Inpatient Hospital Stay (HOSPITAL_COMMUNITY)
Admission: RE | Admit: 2012-06-15 | Discharge: 2012-06-17 | DRG: 261 | Disposition: A | Discharge status: Home / Self Care | Payer: Medicare Other | Source: Ambulatory Visit | Attending: Internal Medicine | Admitting: Internal Medicine

## 2012-06-15 ENCOUNTER — Encounter (HOSPITAL_COMMUNITY): Payer: Self-pay | Admitting: *Deleted

## 2012-06-15 ENCOUNTER — Inpatient Hospital Stay (HOSPITAL_COMMUNITY): Payer: Medicare Other | Admitting: Anesthesiology

## 2012-06-15 DIAGNOSIS — H906 Mixed conductive and sensorineural hearing loss, bilateral: Secondary | ICD-10-CM | POA: Diagnosis present

## 2012-06-15 DIAGNOSIS — I2510 Atherosclerotic heart disease of native coronary artery without angina pectoris: Secondary | ICD-10-CM | POA: Diagnosis not present

## 2012-06-15 DIAGNOSIS — E785 Hyperlipidemia, unspecified: Secondary | ICD-10-CM | POA: Diagnosis present

## 2012-06-15 DIAGNOSIS — M353 Polymyalgia rheumatica: Secondary | ICD-10-CM | POA: Diagnosis present

## 2012-06-15 DIAGNOSIS — Z7901 Long term (current) use of anticoagulants: Secondary | ICD-10-CM

## 2012-06-15 DIAGNOSIS — Z87891 Personal history of nicotine dependence: Secondary | ICD-10-CM

## 2012-06-15 DIAGNOSIS — I498 Other specified cardiac arrhythmias: Secondary | ICD-10-CM | POA: Diagnosis present

## 2012-06-15 DIAGNOSIS — I428 Other cardiomyopathies: Secondary | ICD-10-CM | POA: Diagnosis not present

## 2012-06-15 DIAGNOSIS — I4891 Unspecified atrial fibrillation: Secondary | ICD-10-CM | POA: Diagnosis not present

## 2012-06-15 DIAGNOSIS — B999 Unspecified infectious disease: Secondary | ICD-10-CM | POA: Diagnosis not present

## 2012-06-15 DIAGNOSIS — R1310 Dysphagia, unspecified: Secondary | ICD-10-CM | POA: Diagnosis present

## 2012-06-15 DIAGNOSIS — N400 Enlarged prostate without lower urinary tract symptoms: Secondary | ICD-10-CM | POA: Diagnosis present

## 2012-06-15 DIAGNOSIS — T827XXA Infection and inflammatory reaction due to other cardiac and vascular devices, implants and grafts, initial encounter: Secondary | ICD-10-CM | POA: Diagnosis not present

## 2012-06-15 DIAGNOSIS — Z950 Presence of cardiac pacemaker: Secondary | ICD-10-CM | POA: Diagnosis not present

## 2012-06-15 DIAGNOSIS — K219 Gastro-esophageal reflux disease without esophagitis: Secondary | ICD-10-CM | POA: Diagnosis present

## 2012-06-15 DIAGNOSIS — T82190A Other mechanical complication of cardiac electrode, initial encounter: Secondary | ICD-10-CM | POA: Diagnosis not present

## 2012-06-15 DIAGNOSIS — Y849 Medical procedure, unspecified as the cause of abnormal reaction of the patient, or of later complication, without mention of misadventure at the time of the procedure: Secondary | ICD-10-CM | POA: Diagnosis present

## 2012-06-15 DIAGNOSIS — Y92009 Unspecified place in unspecified non-institutional (private) residence as the place of occurrence of the external cause: Secondary | ICD-10-CM

## 2012-06-15 HISTORY — PX: PACEMAKER GENERATOR CHANGE: SHX5481

## 2012-06-15 HISTORY — PX: GENERATOR REMOVAL: SHX5468

## 2012-06-15 LAB — APTT: aPTT: 35 seconds (ref 24–37)

## 2012-06-15 LAB — BASIC METABOLIC PANEL
Calcium: 9 mg/dL (ref 8.4–10.5)
Chloride: 104 mEq/L (ref 96–112)
Creatinine, Ser: 0.94 mg/dL (ref 0.50–1.35)
GFR calc Af Amer: 88 mL/min — ABNORMAL LOW (ref >90)
GFR calc non Af Amer: 76 mL/min — ABNORMAL LOW (ref >90)

## 2012-06-15 LAB — POCT I-STAT 4, (NA,K, GLUC, HGB,HCT)
Glucose, Bld: 97 mg/dL (ref 70–99)
Hemoglobin: 14.3 g/dL (ref 13.0–17.0)
Potassium: 4 mEq/L (ref 3.5–5.1)

## 2012-06-15 LAB — CBC
MCV: 81.9 fL (ref 78.0–100.0)
Platelets: 216 10*3/uL (ref 150–400)
RDW: 14.4 % (ref 11.5–15.5)
WBC: 5.9 10*3/uL (ref 4.0–10.5)

## 2012-06-15 LAB — TYPE AND SCREEN: Antibody Screen: NEGATIVE

## 2012-06-15 LAB — MRSA PCR SCREENING: MRSA by PCR: NEGATIVE

## 2012-06-15 LAB — SURGICAL PCR SCREEN: MRSA, PCR: NEGATIVE

## 2012-06-15 LAB — ABO/RH: ABO/RH(D): A POS

## 2012-06-15 SURGERY — REMOVAL, PULSE GENERATOR, ICD
Anesthesia: General | Site: Chest

## 2012-06-15 MED ORDER — LIDOCAINE HCL (PF) 1 % IJ SOLN
INTRAMUSCULAR | Status: AC
  Filled 2012-06-15: qty 30

## 2012-06-15 MED ORDER — WARFARIN SODIUM 2.5 MG PO TABS: 2.5000 mg | ORAL_TABLET | Freq: Every day | ORAL | Status: DC

## 2012-06-15 MED ORDER — SODIUM CHLORIDE 0.9 % IR SOLN
Freq: Once | Status: DC
  Filled 2012-06-15: qty 2

## 2012-06-15 MED ORDER — MEPERIDINE HCL 25 MG/ML IJ SOLN: 6.2500 mg | INTRAMUSCULAR | Status: DC | PRN

## 2012-06-15 MED ORDER — VANCOMYCIN HCL 10 G IV SOLR
2000.0000 mg | Freq: Two times a day (BID) | INTRAVENOUS | Status: AC
  Administered 2012-06-15: 2000 mg via INTRAVENOUS
  Filled 2012-06-15: qty 2000

## 2012-06-15 MED ORDER — HYDROMORPHONE HCL PF 1 MG/ML IJ SOLN
INTRAMUSCULAR | Status: AC
  Administered 2012-06-15: 0.5 mg via INTRAVENOUS
  Filled 2012-06-15: qty 1

## 2012-06-15 MED ORDER — SODIUM CHLORIDE 0.9 % IR SOLN
Status: DC | PRN
  Administered 2012-06-15: 15:00:00

## 2012-06-15 MED ORDER — AMIODARONE HCL 200 MG PO TABS
200.0000 mg | ORAL_TABLET | Freq: Every day | ORAL | Status: DC
  Administered 2012-06-15 – 2012-06-17 (×3): 200 mg via ORAL
  Filled 2012-06-15 (×3): qty 1

## 2012-06-15 MED ORDER — GLYCOPYRROLATE 0.2 MG/ML IJ SOLN
INTRAMUSCULAR | Status: DC | PRN
  Administered 2012-06-15: .6 mg via INTRAVENOUS

## 2012-06-15 MED ORDER — PROMETHAZINE HCL 25 MG/ML IJ SOLN: 6.2500 mg | INTRAMUSCULAR | Status: DC | PRN

## 2012-06-15 MED ORDER — MUPIROCIN 2 % EX OINT: TOPICAL_OINTMENT | Freq: Two times a day (BID) | CUTANEOUS | Status: DC

## 2012-06-15 MED ORDER — SIMVASTATIN 5 MG PO TABS
5.0000 mg | ORAL_TABLET | Freq: Every day | ORAL | Status: DC
  Administered 2012-06-16: 5 mg via ORAL
  Filled 2012-06-15 (×2): qty 1

## 2012-06-15 MED ORDER — PROPOFOL 10 MG/ML IV BOLUS
INTRAVENOUS | Status: DC | PRN
  Administered 2012-06-15: 150 mg via INTRAVENOUS

## 2012-06-15 MED ORDER — ONDANSETRON HCL 4 MG/2ML IJ SOLN
INTRAMUSCULAR | Status: DC | PRN
  Administered 2012-06-15: 4 mg via INTRAVENOUS

## 2012-06-15 MED ORDER — LACTATED RINGERS IV SOLN
INTRAVENOUS | Status: DC | PRN
  Administered 2012-06-15: 12:00:00 via INTRAVENOUS

## 2012-06-15 MED ORDER — LIDOCAINE HCL (PF) 1 % IJ SOLN: INTRAMUSCULAR | Status: DC | PRN

## 2012-06-15 MED ORDER — OXYCODONE HCL 5 MG/5ML PO SOLN: 5.0000 mg | Freq: Once | ORAL | Status: DC | PRN

## 2012-06-15 MED ORDER — LIDOCAINE HCL (CARDIAC) 20 MG/ML IV SOLN
INTRAVENOUS | Status: DC | PRN
  Administered 2012-06-15: 100 mg via INTRAVENOUS

## 2012-06-15 MED ORDER — CHLORHEXIDINE GLUCONATE 4 % EX LIQD: 60.0000 mL | Freq: Once | CUTANEOUS | Status: DC

## 2012-06-15 MED ORDER — SODIUM CHLORIDE 0.9 % IV SOLN: INTRAVENOUS | Status: DC

## 2012-06-15 MED ORDER — WARFARIN SODIUM 2.5 MG PO TABS
2.5000 mg | ORAL_TABLET | ORAL | Status: DC
  Administered 2012-06-16: 2.5 mg via ORAL
  Filled 2012-06-15: qty 1

## 2012-06-15 MED ORDER — ONDANSETRON HCL 4 MG/2ML IJ SOLN: 4.0000 mg | Freq: Four times a day (QID) | INTRAMUSCULAR | Status: DC | PRN

## 2012-06-15 MED ORDER — PHENYLEPHRINE HCL 10 MG/ML IJ SOLN
INTRAMUSCULAR | Status: DC | PRN
  Administered 2012-06-15 (×3): 40 ug via INTRAVENOUS

## 2012-06-15 MED ORDER — FENTANYL CITRATE 0.05 MG/ML IJ SOLN
INTRAMUSCULAR | Status: DC | PRN
  Administered 2012-06-15: 50 ug via INTRAVENOUS
  Administered 2012-06-15: 100 ug via INTRAVENOUS

## 2012-06-15 MED ORDER — ROCURONIUM BROMIDE 100 MG/10ML IV SOLN
INTRAVENOUS | Status: DC | PRN
  Administered 2012-06-15: 40 mg via INTRAVENOUS

## 2012-06-15 MED ORDER — DIGOXIN 125 MCG PO TABS
125.0000 ug | ORAL_TABLET | Freq: Every day | ORAL | Status: DC
  Administered 2012-06-15 – 2012-06-17 (×3): 125 ug via ORAL
  Filled 2012-06-15 (×3): qty 1

## 2012-06-15 MED ORDER — HYDROMORPHONE HCL PF 1 MG/ML IJ SOLN
0.2500 mg | INTRAMUSCULAR | Status: DC | PRN
  Administered 2012-06-15: 0.5 mg via INTRAVENOUS

## 2012-06-15 MED ORDER — OXYCODONE HCL 5 MG PO TABS: 5.0000 mg | ORAL_TABLET | Freq: Once | ORAL | Status: DC | PRN

## 2012-06-15 MED ORDER — ARTIFICIAL TEARS OP OINT
TOPICAL_OINTMENT | OPHTHALMIC | Status: DC | PRN
  Administered 2012-06-15: 1 via OPHTHALMIC

## 2012-06-15 MED ORDER — NEOSTIGMINE METHYLSULFATE 1 MG/ML IJ SOLN
INTRAMUSCULAR | Status: DC | PRN
  Administered 2012-06-15: 4 mg via INTRAVENOUS

## 2012-06-15 MED ORDER — WARFARIN - PHYSICIAN DOSING INPATIENT
Freq: Every day | Status: DC
  Administered 2012-06-16: 18:00:00

## 2012-06-15 MED ORDER — HEPARIN SODIUM (PORCINE) 5000 UNIT/ML IJ SOLN
5000.0000 [IU] | Freq: Three times a day (TID) | INTRAMUSCULAR | Status: DC
  Administered 2012-06-15 – 2012-06-17 (×5): 5000 [IU] via SUBCUTANEOUS
  Filled 2012-06-15 (×8): qty 1

## 2012-06-15 MED ORDER — MUPIROCIN 2 % EX OINT
TOPICAL_OINTMENT | CUTANEOUS | Status: AC
  Administered 2012-06-15: 1
  Filled 2012-06-15: qty 22

## 2012-06-15 MED ORDER — CEFAZOLIN SODIUM-DEXTROSE 2-3 GM-% IV SOLR
2.0000 g | Freq: Once | INTRAVENOUS | Status: AC
  Administered 2012-06-15: 2 g via INTRAVENOUS

## 2012-06-15 MED ORDER — FINASTERIDE 5 MG PO TABS
5.0000 mg | ORAL_TABLET | ORAL | Status: DC
  Administered 2012-06-16: 5 mg via ORAL
  Filled 2012-06-15 (×2): qty 1

## 2012-06-15 MED ORDER — PHENYLEPHRINE HCL 10 MG/ML IJ SOLN
20.0000 mg | INTRAVENOUS | Status: DC | PRN
  Administered 2012-06-15: 10 ug/min via INTRAVENOUS

## 2012-06-15 MED ORDER — CARVEDILOL 12.5 MG PO TABS
12.5000 mg | ORAL_TABLET | Freq: Two times a day (BID) | ORAL | Status: DC
  Administered 2012-06-16 – 2012-06-17 (×3): 12.5 mg via ORAL
  Filled 2012-06-15 (×5): qty 1

## 2012-06-15 MED ORDER — ACETAMINOPHEN 325 MG PO TABS
325.0000 mg | ORAL_TABLET | ORAL | Status: DC | PRN
  Administered 2012-06-16 – 2012-06-17 (×5): 650 mg via ORAL
  Filled 2012-06-15 (×5): qty 2

## 2012-06-15 MED ORDER — 0.9 % SODIUM CHLORIDE (POUR BTL) OPTIME
TOPICAL | Status: DC | PRN
  Administered 2012-06-15: 1000 mL

## 2012-06-15 MED ORDER — PANTOPRAZOLE SODIUM 40 MG PO TBEC
40.0000 mg | DELAYED_RELEASE_TABLET | ORAL | Status: DC
  Administered 2012-06-16: 40 mg via ORAL
  Filled 2012-06-15: qty 1

## 2012-06-15 MED ORDER — LACTATED RINGERS IV SOLN
INTRAVENOUS | Status: DC
  Administered 2012-06-15: 12:00:00 via INTRAVENOUS

## 2012-06-15 SURGICAL SUPPLY — 34 items
BAG BANDED W/RUBBER/TAPE 36X54 (MISCELLANEOUS) ×1 IMPLANT
BAG EQP BAND 135X91 W/RBR TAPE (MISCELLANEOUS) ×2
BLADE STERNUM SYSTEM 6 (BLADE) ×1 IMPLANT
BLADE SURG 11 STRL SS (BLADE) ×1 IMPLANT
CANISTER SUCTION 2500CC (MISCELLANEOUS) ×3 IMPLANT
CLOTH BEACON ORANGE TIMEOUT ST (SAFETY) ×3 IMPLANT
DRAPE C-ARM 42X72 X-RAY (DRAPES) ×2 IMPLANT
DRAPE CARDIOVASCULAR INCISE (DRAPES) ×3
DRAPE INCISE IOBAN 66X45 STRL (DRAPES) ×3 IMPLANT
DRAPE PROXIMA HALF (DRAPES) ×4 IMPLANT
DRAPE SRG 135X102X78XABS (DRAPES) ×2 IMPLANT
ELECT REM PT RETURN 9FT ADLT (ELECTROSURGICAL) ×3
ELECTRODE REM PT RTRN 9FT ADLT (ELECTROSURGICAL) ×2 IMPLANT
GAUZE SPONGE 4X4 16PLY XRAY LF (GAUZE/BANDAGES/DRESSINGS) ×3 IMPLANT
GLOVE BIO SURGEON STRL SZ8 (GLOVE) ×2 IMPLANT
GLOVE BIOGEL PI IND STRL 7.5 (GLOVE) ×2 IMPLANT
GLOVE BIOGEL PI INDICATOR 7.5 (GLOVE) ×1
GLOVE ECLIPSE 8.0 STRL XLNG CF (GLOVE) ×1 IMPLANT
GOWN PREVENTION PLUS XLARGE (GOWN DISPOSABLE) ×3 IMPLANT
GOWN STRL NON-REIN LRG LVL3 (GOWN DISPOSABLE) ×6 IMPLANT
KIT ROOM TURNOVER OR (KITS) ×3 IMPLANT
KIT SNARE 25MM LOOP 120CM 6F (VASCULAR PRODUCTS) ×1 IMPLANT
LEAD TENDRIL SDX 2088TC-52CM (Lead) ×1 IMPLANT
PAD ARMBOARD 7.5X6 YLW CONV (MISCELLANEOUS) ×6 IMPLANT
PENCIL BUTTON HOLSTER BLD 10FT (ELECTRODE) IMPLANT
SPONGE GAUZE 4X4 12PLY (GAUZE/BANDAGES/DRESSINGS) ×3 IMPLANT
SUT PROLENE 2 0 CT2 30 (SUTURE) ×5 IMPLANT
SUT SILK 0 FSL (SUTURE) ×4 IMPLANT
SUT VIC AB 2-0 CT2 18 VCP726D (SUTURE) ×2 IMPLANT
SUT VIC AB 3-0 X1 27 (SUTURE) ×2 IMPLANT
TAPE CLOTH SURG 4X10 WHT LF (GAUZE/BANDAGES/DRESSINGS) ×1 IMPLANT
TOWEL OR 17X24 6PK STRL BLUE (TOWEL DISPOSABLE) ×6 IMPLANT
TUBE CONNECTING 12X1/4 (SUCTIONS) ×2 IMPLANT
YANKAUER SUCT BULB TIP NO VENT (SUCTIONS) ×3 IMPLANT

## 2012-06-15 NOTE — Progress Notes (Signed)
Orthopedic Tech Progress Note Patient Details:  [name] 01-29-1930 [id number]  Ortho Devices Type of Ortho Device: Arm sling Ortho Device/Splint Location: (R) UE Ortho Device/Splint Interventions: Ordered;Application   [name] 06/15/2012, 7:16 PM

## 2012-06-15 NOTE — Anesthesia Preprocedure Evaluation (Signed)
Anesthesia Evaluation  Patient identified by MRN, date of birth, ID band Patient awake    Airway Mallampati: II  Neck ROM: Full    Dental  (+) Teeth Intact   Pulmonary neg pulmonary ROS,  breath sounds clear to auscultation        Cardiovascular + CAD and +CHF + dysrhythmias Rhythm:Regular Rate:Normal     Neuro/Psych negative neurological ROS     GI/Hepatic GERD-  ,  Endo/Other    Renal/GU      Musculoskeletal   Abdominal   Peds  Hematology negative hematology ROS (+)   Anesthesia Other Findings   Reproductive/Obstetrics                           Anesthesia Physical Anesthesia Plan  ASA: III  Anesthesia Plan: General   Post-op Pain Management:    Induction: Intravenous  Airway Management Planned: Oral ETT  Additional Equipment:   Intra-op Plan:   Post-operative Plan: Extubation in OR  Informed Consent: I have reviewed the patients History and Physical, chart, labs and discussed the procedure including the risks, benefits and alternatives for the proposed anesthesia with the patient or authorized representative who has indicated his/her understanding and acceptance.   Dental advisory given  Plan Discussed with: CRNA and Surgeon  Anesthesia Plan Comments:         Anesthesia Quick Evaluation

## 2012-06-15 NOTE — Anesthesia Procedure Notes (Signed)
Procedure Name: Intubation Date/Time: 06/15/2012 2:55 PM Performed by: Angelica Pou Pre-anesthesia Checklist: Patient identified, Emergency Drugs available, Suction available, Patient being monitored and Timeout performed Patient Re-evaluated:Patient Re-evaluated prior to inductionOxygen Delivery Method: Circle system utilized Preoxygenation: Pre-oxygenation with 100% oxygen Intubation Type: IV induction Ventilation: Mask ventilation without difficulty Laryngoscope Size: Mac and 3 Grade View: Grade II Tube type: Oral Tube size: 7.5 mm Number of attempts: 1 Airway Equipment and Method: Stylet Placement Confirmation: ETT inserted through vocal cords under direct vision,  positive ETCO2 and CO2 detector Secured at: 23 cm Tube secured with: Tape Dental Injury: Teeth and Oropharynx as per pre-operative assessment  Comments: ETT placed by Sandra Cockayne, CRNA

## 2012-06-15 NOTE — Preoperative (Signed)
Beta Blockers   Reason not to administer Beta Blockers:b blocker taken 06/15/12 @ 0750

## 2012-06-15 NOTE — H&P (Signed)
[name] is an 79 y.o. male.   Chief Complaint:  "I am here to have my pacemaker removed" HPI: The patient is an 79 year old man with a history of coronary artery disease, symptomatic bradycardia, persistent atrial fibrillation, status post pacemaker insertion in 1998, with a generator change proximally 7 years ago. He has a Engineer, water. Jude dual-chamber system in place. The patient has done well until several months ago when he noted swelling in his pacemaker insertion. There was a remote history of trauma to this area though the skin was not torn. Over the last several weeks, the skin has begun to thin and he has had more pain and tenderness. He denies fevers or chills. He was seen in the office, several days ago, and is referred today for pacing system extraction for pending erosion of the device.  Past Medical History  Diagnosis Date  . Coronary artery disease   . Cardiomyopathy primary-nonischemic EF 45%  . Hyperlipidemia   . GERD (gastroesophageal reflux disease)   . Polymyalgia rheumatica   . Sick sinus syndrome   . Atrial fibrillation   . Dysphagia   . Hearing loss, mixed, bilateral   . Increased prostate specific antigen (PSA) velocity   . Lumbar back pain   . BPH (benign prostatic hypertrophy)   . Breast mass   . CHF (congestive heart failure)   . Umbilical hernia   . Right inguinal hernia   . Elevated liver enzymes   . Esophageal stricture   . Dyslipidemia     Past Surgical History  Procedure Laterality Date  . Cataract extraction      left  . Umbilical hernia repair    . Insert / replace / remove pacemaker    . Hernia repair  2012    [facility]  . Tonsilectomy, adenoidectomy, bilateral myringotomy and tubes    . Cardioversion  06/17/2011    Procedure: CARDIOVERSION;  Surgeon: [name], MD;  Location: [facility] OR;  Service: Cardiovascular;  Laterality: N/A;  . Cardioversion  09/09/2011    Procedure: CARDIOVERSION;  Surgeon: Luis Abed, MD;  Location: [facility] OR;  Service:  Cardiovascular;  Laterality: N/A;  . Cardioversion  01/01/2012    Procedure: CARDIOVERSION;  Surgeon: Herby Abraham, MD;  Location: [facility] ENDOSCOPY;  Service: Cardiovascular;  Laterality: N/A;  . Tee without cardioversion  01/01/2012    Procedure: TRANSESOPHAGEAL ECHOCARDIOGRAM (TEE);  Surgeon: Pricilla Riffle, MD;  Location: [facility] ENDOSCOPY;  Service: Cardiovascular;  Laterality: N/A;  . Eye surgery      Family History  Problem Relation Age of Onset  . Heart attack Father 33    deceased  . Aneurysm Mother     deceased AAA  . Hypertension Mother   . Other Brother     deceased at birth   Social History:  reports that he quit smoking about 29 years ago. His smoking use included Cigarettes. He smoked 0.00 packs per day. He has never used smokeless tobacco. He reports that  drinks alcohol. He reports that he does not use illicit drugs.  Allergies:  Allergies  Allergen Reactions  . Antihistamines, Diphenhydramine-Type Other (See Comments)    Causes difficulty in ability to urinate.    Medications Prior to Admission  Medication Sig Dispense Refill  . acetaminophen (TYLENOL) 500 MG tablet Take 500 mg by mouth daily as needed for pain.      Marland Kitchen amiodarone (PACERONE) 200 MG tablet Take 1 tablet (200 mg total) by mouth daily.  90 tablet  3  . carvedilol (COREG) 12.5 MG tablet Take 12.5 mg by mouth 2 (two) times daily with a meal.      . digoxin (LANOXIN) 0.25 MG tablet Take 0.5 tablets (125 mcg total) by mouth daily.  90 tablet  3  . finasteride (PROSCAR) 5 MG tablet Take 5 mg by mouth 4 (four) times a week. Monday, Tuesday, Thursday, Saturday      . fluticasone (FLONASE) 50 MCG/ACT nasal spray Place 2 sprays into the nose daily.  16 g  3  . magnesium oxide (MAG-OX) 400 MG tablet Take 400 mg by mouth daily.      . Multiple Vitamins-Minerals (MULTIVITAMIN WITH MINERALS) tablet Take 1 tablet by mouth daily.        . pantoprazole (PROTONIX) 40 MG tablet Take 40 mg by mouth 4 (four) times a week.  Monday, Tuesday, Thursday and Saturday      . pravastatin (PRAVACHOL) 80 MG tablet Take 1 tablet (80 mg total) by mouth daily.  90 tablet  3  . warfarin (COUMADIN) 5 MG tablet Take 2.5-5 mg by mouth daily. 1 tablet (5 mg) on Monday, Wednesday and Friday, 1/2 tablet (2.5 mg) on all other days        Results for orders placed during the hospital encounter of 06/15/12 (from the past 48 hour(s))  TYPE AND SCREEN     Status: None   Collection Time    06/15/12  9:10 AM      Result Value Range   ABO/RH(D) A POS     Antibody Screen PENDING     Sample Expiration 06/18/2012    CBC     Status: None   Collection Time    06/15/12  9:13 AM      Result Value Range   WBC 5.9  4.0 - 10.5 K/uL   RBC 5.09  4.22 - 5.81 MIL/uL   Hemoglobin 15.0  13.0 - 17.0 g/dL   HCT 40.9  81.1 - 91.4 %   MCV 81.9  78.0 - 100.0 fL   MCH 29.5  26.0 - 34.0 pg   MCHC 36.0  30.0 - 36.0 g/dL   RDW 78.2  95.6 - 21.3 %   Platelets 216  150 - 400 K/uL  BASIC METABOLIC PANEL     Status: Abnormal   Collection Time    06/15/12  9:13 AM      Result Value Range   Sodium 139  135 - 145 mEq/L   Potassium 4.3  3.5 - 5.1 mEq/L   Chloride 104  96 - 112 mEq/L   CO2 28  19 - 32 mEq/L   Glucose, Bld 112 (*) 70 - 99 mg/dL   BUN 16  6 - 23 mg/dL   Creatinine, Ser 0.86  0.50 - 1.35 mg/dL   Calcium 9.0  8.4 - 57.8 mg/dL   GFR calc non Af Amer 76 (*) >90 mL/min   GFR calc Af Amer 88 (*) >90 mL/min   Comment:            The eGFR has been calculated     using the CKD EPI equation.     This calculation has not been     validated in all clinical     situations.     eGFR's persistently     <90 mL/min signify     possible Chronic Kidney Disease.  PROTIME-INR     Status: Abnormal   Collection Time    06/15/12  9:13 AM  Result Value Range   Prothrombin Time 17.2 (*) 11.6 - 15.2 seconds   INR 1.44  0.00 - 1.49  APTT     Status: None   Collection Time    06/15/12  9:13 AM      Result Value Range   aPTT 35  24 - 37 seconds    No results found.  ROS - the patient denies fevers chills night sweats or skin rashes. All the systems reviewed and negative except as noted.  Physical exam  Blood pressure 105/66, pulse 97, temperature 97.4 F (36.3 C), temperature source Oral, resp. rate 20, height 5\' 7"  (1.702 m), weight 163 lb 9.3 oz (74.2 kg), SpO2 98.00%. Well appearing 79 year old man,  NAD who looks younger than his stated age. HEENT: Unremarkable Neck:  No JVD, no thyromegally Lungs:  Clear with no wheezes, rales, or rhonchi. Pacemaker insertion site has erythema, tenderness, swelling, and his device is visible because the skin has become so thin though it is not clearly open. HEART:  Regular rate rhythm, no murmurs, no rubs, no clicks Abd:  Flat, positive bowel sounds, no organomegally, no rebound, no guarding Ext:  2 plus pulses, no edema, no cyanosis, no clubbing Skin:  No rashes no nodules Neuro:  CN II through XII intact, motor grossly intact  Chest x-ray - reviewed, right-sided pacing system in place  Assessment/Plan 1. pacemaker system infection 2. coronary artery disease 3. persistent atrial fibrillation, maintained sinus rhythm on amiodarone Rec: I discussed the treatment options with the patient. The risk, goals, benefits, and expectations of pacemaker system removal have been discussed in detail as has the possibility of placement of a temporary pacing system. He wishes to proceed. Gregg Taylor,M.D. 06/15/2012, 10:45 AM

## 2012-06-15 NOTE — Op Note (Signed)
PPM extraction with insertion of a temporary permanent TV PPM without immediate complication. [id number].

## 2012-06-15 NOTE — Anesthesia Postprocedure Evaluation (Signed)
  Anesthesia Post-op Note  Patient: [name]  Procedure(s) Performed: Procedure(s): GENERATOR REMOVAL (Left) PACEMAKER GENERATOR CHANGE (N/A)  Patient Location: PACU  Anesthesia Type:General  Level of Consciousness: awake and sedated  Airway and Oxygen Therapy: Patient Spontanous Breathing  Post-op Pain: mild  Post-op Assessment: Post-op Vital signs reviewed  Post-op Vital Signs: stable  Complications: No apparent anesthesia complications

## 2012-06-15 NOTE — Transfer of Care (Signed)
Immediate Anesthesia Transfer of Care Note  Patient: [name]  Procedure(s) Performed: Procedure(s): GENERATOR REMOVAL (Left) PACEMAKER GENERATOR CHANGE (N/A)  Patient Location: PACU  Anesthesia Type:General  Level of Consciousness: awake, alert , oriented and patient cooperative  Airway & Oxygen Therapy: Patient Spontanous Breathing and Patient connected to nasal cannula oxygen  Post-op Assessment: Report given to PACU RN, Post -op Vital signs reviewed and stable and Patient moving all extremities  Post vital signs: Reviewed and stable  Complications: No apparent anesthesia complications

## 2012-06-16 ENCOUNTER — Inpatient Hospital Stay (HOSPITAL_COMMUNITY): Payer: Medicare Other

## 2012-06-16 ENCOUNTER — Encounter (HOSPITAL_COMMUNITY): Payer: Self-pay | Admitting: Internal Medicine

## 2012-06-16 ENCOUNTER — Encounter: Payer: Medicare Other | Admitting: Internal Medicine

## 2012-06-16 DIAGNOSIS — Z950 Presence of cardiac pacemaker: Secondary | ICD-10-CM | POA: Diagnosis not present

## 2012-06-16 DIAGNOSIS — T827XXA Infection and inflammatory reaction due to other cardiac and vascular devices, implants and grafts, initial encounter: Secondary | ICD-10-CM | POA: Diagnosis not present

## 2012-06-16 DIAGNOSIS — B999 Unspecified infectious disease: Secondary | ICD-10-CM | POA: Diagnosis not present

## 2012-06-16 DIAGNOSIS — I2510 Atherosclerotic heart disease of native coronary artery without angina pectoris: Secondary | ICD-10-CM | POA: Diagnosis not present

## 2012-06-16 DIAGNOSIS — I428 Other cardiomyopathies: Secondary | ICD-10-CM | POA: Diagnosis not present

## 2012-06-16 NOTE — Care Management Note (Signed)
    Page 1 of 1   06/16/2012     8:47:57 AM   CARE MANAGEMENT NOTE 06/16/2012  Patient:  Craig, [name]   Account Number:  [id number]  Date Initiated:  06/16/2012  Documentation initiated by:  Junius Creamer  Subjective/Objective Assessment:   adm w pacer malfunction     Action/Plan:   lives w wife, pcp dr Smitty Cords swords   Anticipated DC Date:     Anticipated DC Plan:        DC Planning Services  CM consult      Choice offered to / List presented to:             Status of service:   Medicare Important Message given?   (If response is "NO", the following Medicare IM given date fields will be blank) Date Medicare IM given:   Date Additional Medicare IM given:    Discharge Disposition:    Per UR Regulation:  Reviewed for med. necessity/level of care/duration of stay  If discussed at Long Length of Stay Meetings, dates discussed:    Comments:  4/10 0847 debbie Madelina Sanda rn,bsn

## 2012-06-16 NOTE — Op Note (Signed)
NAMELAMOYNE, HESSEL NO.:  [id number]  MEDICAL RECORD NO.:  [id number]  LOCATION:  2928                         FACILITY:  [facility]  PHYSICIAN:  [name]. Ladona Ridgel, MD    DATE OF BIRTH:  1929/08/02  DATE OF PROCEDURE:  06/15/2012 DATE OF DISCHARGE:                              OPERATIVE REPORT   PROCEDURE PERFORMED:  Extraction of a dual-chamber pacemaker and insertion of a temporary transvenous pacemaker. introduction.  INTRODUCTION:  The patient is an 79 year old male with complete heart block who first underwent pacemaker insertion in 1998 followed by pacemaker generator change in 2007.  Several months ago, the patient developed swelling at his pacemaker pocket.  There have been no fevers or chills, but there had been a history of prior trauma.  The patient subsequently has developed swelling and erythema and dramatic thinning of the skin such that you can see the device under the skin very clearly and is impending erosion.  He is now referred for extraction of his pacing system and insertion of a temporary transvenous pacemaker as he has underlying complete heart block.  PROCEDURE:  After informed consent was obtained, the patient was taken to the operating room in the fasting state.  The Anesthesia Service was utilized to provide general anesthesia.  Invasive arterial monitoring was carried out.  An 8-French sheath was inserted into the right femoral vein.  A 0.35 J-wire was advanced into the right jugular vein and this was utilized to provide direction for insertion of a temporary transvenous pacemaker.  The right internal jugular vein was then punctured and the 7-French active fixation bipolar pacing lead was advanced through the right femoral vein and under fluoroscopic guidance into the right ventricle where the R-waves were found to be 5 mV and the pacing threshold was less than a V at 0.5 milliseconds.  10 V pacing did not stimulate the diaphragm with  the lead actively fixed.  At this point, the lead was secured to the skin with silk suture and the sewing sleeve was secured to the skin with silk suture.  The patient was hooked up for backup brady pacing.  Next, a 10-cm incision was carried out over the right infraclavicular region.  This incision was carried out wide as there was necrotic skin which was cut free.  The generator was freed up with electrocautery.  The atrial lead was disconnected and electrocautery was utilized to dissect free the bound up pacing leads. Through the atrial lead, advanced a 52-cm stylet all the way to the tip of the lead.  A Liberator locking stylet was then advanced into the atrial lead.  The lead was cut prior to this.  These were passive leads. The [facility] RL 11-French shortie sheath was advanced over the locking stylet and over the lead and into the superior vena cava.  This sheath was removed and the [facility] 11-French RL long sheath was advanced over the sheath and down into the inferior vena cava and pulling the atrial lead free, the lead was down to the right ventricular lead and could not be removed.  At this point, the Liberator was clamped and attention was then turned to the right ventricular lead.  After a 52-cm stylet was advanced into the right ventricular lead and it was disconnected from the generator, the Liberator sheath was advanced into the body of the lead itself.  Unfortunately, it could not be advanced all the way to the apex.  At this point, the lead was tied off at the proximal and with a 1 tie Cook suture.  An 11-French shortie RL sheath was advanced over the lead into the superior vena cava.  A new 11-French RL long sheath was then advanced into the lead and the sheath was advanced into the superior vena cava, but there was a very dense fibrous binding site at the superior portion of the superior vena cava as it rounded the band going towards the right atrium.  At this point, I turned  my attention back to the atrial lead and was able to remove the atrial lead in total with the 11-French [facility] RL sheath.  There were no hemodynamic sequelae. The ventricular lead had almost broken after careful examination, and the Liberator stylet was pulled back to the midportion of the lead itself.  At this point, a decision was made to snare the lead the remaining portion of the lead from the groin.  A 16-French Cook extraction sheath was advanced under fluoroscopic guidance through the right femoral vein.  A 25-mm [place] snare was advanced through the extraction sheath and was utilized to snare the distal portion of the lead.  This was accomplished with modest difficulty.  The snared lead was then pulled back into the 16-French workstation and out through the body.  Again, there were no hemodynamic sequelae.  At this point, the 87- [place] sheath was removed and pressure was held for approximately 30 minutes to obtain hemostasis.  During this time, pressure was also held on the pacemaker pocket.  The pocket was irrigated copiously and the incision was closed with 2-0 Prolene mattress suture.  The temporary pacing lead was connected to a pacemaker generator and secured to the skin.  OpSite placed over the incision and the patient was returned to the recovery area in satisfactory condition.  Of note, the patient's underlying escape rhythm was less than 30 beats per minute.  COMPLICATIONS:  There were no immediate procedure complications.  RESULTS:  This demonstrates successful extraction of a 78 year old dual- chamber pacing system and insertion of a temporary permanent transvenous pacing system without immediate procedure complication,     [name]. Ladona Ridgel, MD     GWT/MEDQ  D:  06/15/2012  T:  06/16/2012  Job:  [id number]

## 2012-06-16 NOTE — Progress Notes (Signed)
   ELECTROPHYSIOLOGY ROUNDING NOTE    Patient Name: [name] Date of Encounter: 06-16-2012    SUBJECTIVE:Patient feels well.  No chest pain or shortness of breath. S/p PPM system extraction and insertion of temp-perm pacemaker 06-15-2012  TELEMETRY: Reviewed telemetry pt ventricular pacing Filed Vitals:   06/15/12 2353 06/16/12 0000 06/16/12 0400 06/16/12 0403  BP:  119/71    Pulse:      Temp: 97.6 F (36.4 C) 97.6 F (36.4 C)  98.6 F (37 C)  TempSrc: Axillary Axillary  Oral  Resp:   18   Height:      Weight:      SpO2:   90%     Intake/Output Summary (Last 24 hours) at 06/16/12 0650 Last data filed at 06/16/12 0600  Gross per 24 hour  Intake    500 ml  Output   1515 ml  Net  -1015 ml    LABS: Basic Metabolic Panel:  Recent Labs  16/10/96 0913 06/15/12 1511  NA 139 140  K 4.3 4.0  CL 104  --   CO2 28  --   GLUCOSE 112* 97  BUN 16  --   CREATININE 0.94  --   CALCIUM 9.0  --    CBC:  Recent Labs  06/15/12 0913 06/15/12 1511  WBC 5.9  --   HGB 15.0 14.3  HCT 41.7 42.0  MCV 81.9  --   PLT 216  --    INR: 1.44 (06-15-2012)  Radiology/Studies:  Final result pending, lead in stable position.  PHYSICAL EXAM Right chest with pressure dressing in place  DEVICE INTERROGATION: Device interrogation pending    EP Attending  Agree with above. He is s/p PPM extraction and insertion of a temporary permanent PM. Exam is benign. Will progress activity and expect to discharge tomorrow. Remove lines and foley.  Leonia Reeves.D.

## 2012-06-17 LAB — CBC
HCT: 38.4 % — ABNORMAL LOW (ref 39.0–52.0)
MCV: 83.5 fL (ref 78.0–100.0)
RBC: 4.6 MIL/uL (ref 4.22–5.81)
RDW: 14.7 % (ref 11.5–15.5)
WBC: 9.1 10*3/uL (ref 4.0–10.5)

## 2012-06-17 LAB — URINALYSIS, ROUTINE W REFLEX MICROSCOPIC
Bilirubin Urine: NEGATIVE
Hgb urine dipstick: NEGATIVE
Specific Gravity, Urine: 1.01 (ref 1.005–1.030)
pH: 5.5 (ref 5.0–8.0)

## 2012-06-17 LAB — BASIC METABOLIC PANEL
CO2: 28 mEq/L (ref 19–32)
Chloride: 104 mEq/L (ref 96–112)
Creatinine, Ser: 0.99 mg/dL (ref 0.50–1.35)

## 2012-06-17 LAB — PROTIME-INR: INR: 1.18 (ref 0.00–1.49)

## 2012-06-17 MED ORDER — CEPHALEXIN 500 MG PO CAPS: 500.0000 mg | ORAL_CAPSULE | Freq: Two times a day (BID) | ORAL | Status: DC

## 2012-06-17 NOTE — Progress Notes (Signed)
Patient ID: [name], male   DOB: [birth year], 79 y.o.   MRN: [id number] Subjective:  No chest pain or sob. Feels "achey"  Objective:  Vital Signs in the last 24 hours: Temp:  [97.9 F (36.6 C)-99 F (37.2 C)] 97.9 F (36.6 C) (04/11 0454) Resp:  [14-22] 16 (04/11 0454) BP: (106-138)/(48-64) 113/60 mmHg (04/11 0454) SpO2:  [92 %-97 %] 92 % (04/11 0151)  Intake/Output from previous day: 04/10 0701 - 04/11 0700 In: 600 [P.O.:600] Out: 1300 [Urine:1300] Intake/Output from this shift:    Physical Exam: Well appearing NAD HEENT: Unremarkable Neck:  No JVD, no thyromegally Back:  No CVA tenderness Lungs:  Clear with no wheezes. Temp-Perm PM in position, clean and dry. HEART:  Regular rate rhythm, no murmurs, no rubs, no clicks Abd:  soft, positive bowel sounds, no organomegally, no rebound, no guarding Ext:  2 plus pulses, no edema, no cyanosis, no clubbing Skin:  No rashes no nodules Neuro:  CN II through XII intact, motor grossly intact  Lab Results:  Recent Labs  06/15/12 0913 06/15/12 1511 06/17/12 0445  WBC 5.9  --  9.1  HGB 15.0 14.3 13.7  PLT 216  --  202    Recent Labs  06/15/12 0913 06/15/12 1511 06/17/12 0445  NA 139 140 138  K 4.3 4.0 4.1  CL 104  --  104  CO2 28  --  28  GLUCOSE 112* 97 119*  BUN 16  --  14  CREATININE 0.94  --  0.99   No results found for this basename: TROPONINI, CK, MB,  in the last 72 hours Hepatic Function Panel No results found for this basename: PROT, ALBUMIN, AST, ALT, ALKPHOS, BILITOT, BILIDIR, IBILI,  in the last 72 hours No results found for this basename: CHOL,  in the last 72 hours No results found for this basename: PROTIME,  in the last 72 hours  Imaging: Dg Chest Port 1 View  06/16/2012  *RADIOLOGY REPORT*  Clinical Data: Status post lead extraction.  PORTABLE CHEST - 1 VIEW  Comparison: Chest radiograph performed 05/27/2012  Findings: The patient's pacemaker has been displaced superiorly, noted projecting  superior to the right clavicle.  The right atrial lead has been removed; the right ventricular lead is grossly unchanged in position.  The lungs are relatively well aerated and appear grossly clear, aside from mild left basilar atelectasis or scarring.  No pleural effusion or pneumothorax is seen.  The cardiomediastinal silhouette is borderline normal in size.  No acute osseous abnormalities are seen.  An external pacing pad is noted.  IMPRESSION:  1.  Pacemaker has been displaced superiorly, noted projecting superior to the right clavicle.  Right atrial lead has been removed; the right ventricular lead appears grossly unchanged in position. 2.  Mild left basilar atelectasis or scarring noted; lungs otherwise clear.   Original Report Authenticated By: Tonia Ghent, M.D.     Cardiac Studies: Tele - ventricular pacing  Assessment/Plan:  1. PPM pocket infection 7 years after impant 2. S/p PPM system extraction with insertion of a temporary perm PM.  Rec: ok for discharge. Ok to dc on keflex 500 mg twice daily. Followup as scheduled. I would anticipate replacing PPM in 10-14 days.  LOS: 2 days    Dayquan Buys,M.D. 06/17/2012, 7:43 AM

## 2012-06-17 NOTE — Discharge Summary (Signed)
ELECTROPHYSIOLOGY PROCEDURE DISCHARGE SUMMARY    Patient ID: [name],  MRN: [id number], DOB/AGE: 05-24-1929 79 y.o.  Admit date: 06/15/2012 Discharge date: 06/17/2012  Primary Care Physician: Birdie Sons, MD Primary Cardiologist: Shawnie Pons, MD Electrophysiologist: [name], MD  Primary Discharge Diagnosis:  Pacemaker pocket infection s/p PPM system removal and implantation of a temp-perm pacemaker this admission  Secondary Discharge Diagnosis:  1.  Sick sinus syndrome s/p pacemaker implant originally in 1998 2.  Atrial fibrillation 3.  GERD 4.  Hyperlipidemia 5.  BPH 6.  Non-ischemic cardiomyopathy   Procedures This Admission:  1.  Extraction of dual chamber pacemaker and insertion of temporary transvenous pacemaker on 06-15-2012 by Dr Ladona Ridgel.  See op note for full details.  There were no early apparent complications.  2.  CXR on 06-16-2012 demonstrated no pneumothorax status post device removal and insertion of temporary pacemaker  Brief HPI: [name] is an 79 year old man with a history of coronary artery disease, symptomatic bradycardia, persistent atrial fibrillation, status post pacemaker insertion in 1998, with a generator change proximally 7 years ago. He has a Engineer, water. Jude dual-chamber system in place. The patient has done well until several months ago when he noted swelling in his pacemaker insertion. There was a remote history of trauma to this area though the skin was not torn. Over the last several weeks, the skin has begun to thin and he has had more pain and tenderness. He denies fevers or chills. He was seen in the office, several days ago, and is referred today for pacing system extraction for pending erosion of the device.  Hospital Course:  The patient was admitted and underwent removal of his pacing system with details as outlined in the dictated op note.  A temporary transvenous pacemaker was placed.  The patient was monitored on telemetry  following his procedure which demonstrated ventricular pacing.  His left chest incision was without significant drainage.  His temporary pacemaker insertion site was without complication.  Dr Ladona Ridgel examined the patient on 06-17-2012 and considered him stable for discharge to home with early follow up next week of his wound.  Left sided pacemaker implantation is planned for the week of 06-27-2012.  These details will be finalized as an outpatient.   Discharge Vitals: Blood pressure 115/60, pulse 71, temperature 97.7 F (36.5 C), temperature source Oral, resp. rate 14, height 5\' 7"  (1.702 m), weight 163 lb 9.3 oz (74.2 kg), SpO2 93.00%.    Labs:   Lab Results  Component Value Date   WBC 9.1 06/17/2012   HGB 13.7 06/17/2012   HCT 38.4* 06/17/2012   MCV 83.5 06/17/2012   PLT 202 06/17/2012    Recent Labs Lab 06/17/12 0445  NA 138  K 4.1  CL 104  CO2 28  BUN 14  CREATININE 0.99  CALCIUM 8.9  GLUCOSE 119*    Discharge Medications:    Medication List    TAKE these medications       acetaminophen 500 MG tablet  Commonly known as:  TYLENOL  Take 500 mg by mouth daily as needed for pain.     amiodarone 200 MG tablet  Commonly known as:  PACERONE  Take 1 tablet (200 mg total) by mouth daily.     carvedilol 12.5 MG tablet  Commonly known as:  COREG  Take 12.5 mg by mouth 2 (two) times daily with a meal.     cephALEXin 500 MG capsule  Commonly known as:  KEFLEX  Take  1 capsule (500 mg total) by mouth 2 (two) times daily. For 7 days then stop.     digoxin 0.25 MG tablet  Commonly known as:  LANOXIN  Take 0.5 tablets (125 mcg total) by mouth daily.     finasteride 5 MG tablet  Commonly known as:  PROSCAR  Take 5 mg by mouth 4 (four) times a week. Monday, Tuesday, Thursday, Saturday     fluticasone 50 MCG/ACT nasal spray  Commonly known as:  FLONASE  Place 2 sprays into the nose daily.     magnesium oxide 400 MG tablet  Commonly known as:  MAG-OX  Take 400 mg by mouth daily.      multivitamin with minerals tablet  Take 1 tablet by mouth daily.     pantoprazole 40 MG tablet  Commonly known as:  PROTONIX  Take 40 mg by mouth 4 (four) times a week. Monday, Tuesday, Thursday and Saturday     pravastatin 80 MG tablet  Commonly known as:  PRAVACHOL  Take 1 tablet (80 mg total) by mouth daily.     warfarin 5 MG tablet  Commonly known as:  COUMADIN  Take 2.5-5 mg by mouth daily. 1 tablet (5 mg) on Monday, Wednesday and Friday, 1/2 tablet (2.5 mg) on all other days        Disposition:      Discharge Orders   Future Appointments Provider Department Dept Phone   06/20/2012 10:00 AM Hillis Range, MD [facility] Main Office [place]) [phone]   06/23/2012 12:00 PM [facility] [phone]   07/08/2012 7:45 AM [facility] [phone]   08/03/2012 9:00 AM Laurey Morale, MD [facility] Main Office [place]) [phone]   Future Orders Complete By Expires     Diet - low sodium heart healthy  As directed     Discharge instructions  As directed     Comments:      Please see post temp-perm PPM implant discharge instructions    Increase activity slowly  As directed       Follow-up Information   Follow up with Hillis Range, MD On 06/20/2012. (At 10:00 AM for wound check)    Contact information:   [address] [place] [address] [phone]      Duration of Discharge Encounter: Greater than 30 minutes including physician time.  Signed, Gypsy Balsam, RN, BSN 06/17/2012, 9:33 AM

## 2012-06-20 ENCOUNTER — Ambulatory Visit: Payer: Medicare Other | Admitting: Internal Medicine

## 2012-06-20 ENCOUNTER — Ambulatory Visit (INDEPENDENT_AMBULATORY_CARE_PROVIDER_SITE_OTHER): Payer: Medicare Other

## 2012-06-20 ENCOUNTER — Encounter: Payer: Self-pay | Admitting: Internal Medicine

## 2012-06-20 ENCOUNTER — Other Ambulatory Visit: Payer: Self-pay | Admitting: Internal Medicine

## 2012-06-20 ENCOUNTER — Encounter: Payer: Self-pay | Admitting: *Deleted

## 2012-06-20 VITALS — BP 99/66 | HR 70 | Ht 67.0 in | Wt 167.6 lb

## 2012-06-20 DIAGNOSIS — T827XXD Infection and inflammatory reaction due to other cardiac and vascular devices, implants and grafts, subsequent encounter: Secondary | ICD-10-CM

## 2012-06-20 DIAGNOSIS — I495 Sick sinus syndrome: Secondary | ICD-10-CM

## 2012-06-20 DIAGNOSIS — I4891 Unspecified atrial fibrillation: Secondary | ICD-10-CM

## 2012-06-20 NOTE — Progress Notes (Signed)
Patient seen today for follow up of recently explanted dual chamber pacemaker and placement of temp-perm pacemaker.  Right chest with sutures in place.  No drainage since discharge on Friday.  Temp-perm pacer in place and sutured in on right upper chest.  No redness, swelling, drainage from any sites.    Pt c/o fatigue and exercise intolerance since discharge, likely related to V pacing.   Sutures left in place today.  Plan to re-evaluate on Thursday and remove sutures at that time (Dr Graciela Husbands to evaluate patient that day).    Pacer re-implantation scheduled for next Tuesday with Dr Ladona Ridgel.  INR today 1.3-- Coumadin dose resumed at previous dose.  Will recheck on Thursday.   Gypsy Balsam, RN, BSN 06/20/2012 10:39 AM   I have seen, examined the patient, and reviewed the above assessment and plan.  Changes to above are made where necessary.  Pt doing well.  Dressing changed today.  Pt will return later in the week for removal of stitches.  Plans are in place for new pacemaker implant by Dr Ladona Ridgel once incision is healed.  Co Sign: Hillis Range, MD 06/20/2012 2:06 PM

## 2012-06-21 ENCOUNTER — Other Ambulatory Visit: Payer: Self-pay | Admitting: *Deleted

## 2012-06-21 DIAGNOSIS — I4891 Unspecified atrial fibrillation: Secondary | ICD-10-CM

## 2012-06-21 DIAGNOSIS — I495 Sick sinus syndrome: Secondary | ICD-10-CM

## 2012-06-23 ENCOUNTER — Ambulatory Visit (INDEPENDENT_AMBULATORY_CARE_PROVIDER_SITE_OTHER): Payer: Medicare Other | Admitting: *Deleted

## 2012-06-23 ENCOUNTER — Other Ambulatory Visit (INDEPENDENT_AMBULATORY_CARE_PROVIDER_SITE_OTHER): Payer: Medicare Other

## 2012-06-23 DIAGNOSIS — I4891 Unspecified atrial fibrillation: Secondary | ICD-10-CM | POA: Diagnosis not present

## 2012-06-23 DIAGNOSIS — I428 Other cardiomyopathies: Secondary | ICD-10-CM

## 2012-06-23 DIAGNOSIS — I495 Sick sinus syndrome: Secondary | ICD-10-CM

## 2012-06-23 LAB — CBC WITH DIFFERENTIAL/PLATELET
Basophils Relative: 0.5 % (ref 0.0–3.0)
Eosinophils Relative: 3.6 % (ref 0.0–5.0)
HCT: 38.7 % — ABNORMAL LOW (ref 39.0–52.0)
Hemoglobin: 13.2 g/dL (ref 13.0–17.0)
Lymphs Abs: 1.6 10*3/uL (ref 0.7–4.0)
MCV: 88.2 fl (ref 78.0–100.0)
Monocytes Absolute: 0.5 10*3/uL (ref 0.1–1.0)
RBC: 4.39 Mil/uL (ref 4.22–5.81)
WBC: 6.2 10*3/uL (ref 4.5–10.5)

## 2012-06-23 LAB — BASIC METABOLIC PANEL
BUN: 19 mg/dL (ref 6–23)
Calcium: 8.5 mg/dL (ref 8.4–10.5)
GFR: 68.99 mL/min (ref >60.00)
Glucose, Bld: 136 mg/dL — ABNORMAL HIGH (ref 70–99)

## 2012-06-23 NOTE — Progress Notes (Signed)
Wound check today.  Every other retention suture removed per Dr. Graciela Husbands today.  Steri strips appliend and the wound was redressed.   The patient is scheduled for removal of his temp. Pacer and implant of his PPM 06/28/12.

## 2012-06-27 ENCOUNTER — Telehealth: Payer: Self-pay | Admitting: Internal Medicine

## 2012-06-27 MED ORDER — CEFAZOLIN SODIUM-DEXTROSE 2-3 GM-% IV SOLR
2.0000 g | INTRAVENOUS | Status: DC
  Filled 2012-06-27 (×2): qty 50

## 2012-06-27 MED ORDER — SODIUM CHLORIDE 0.9 % IR SOLN
80.0000 mg | Status: DC
  Filled 2012-06-27: qty 2

## 2012-06-27 NOTE — Telephone Encounter (Signed)
New Prob    Pt is having a procedure done tomorrow and he wants to know if he should take his coumadin this evening. Please call.

## 2012-06-28 ENCOUNTER — Encounter (HOSPITAL_COMMUNITY): Payer: Self-pay | Admitting: General Practice

## 2012-06-28 ENCOUNTER — Ambulatory Visit (HOSPITAL_COMMUNITY): Payer: Medicare Other

## 2012-06-28 ENCOUNTER — Encounter (HOSPITAL_COMMUNITY)
Admission: RE | Disposition: A | Discharge status: Home / Self Care | Payer: Self-pay | Source: Ambulatory Visit | Attending: Internal Medicine

## 2012-06-28 ENCOUNTER — Ambulatory Visit (HOSPITAL_COMMUNITY)
Admission: RE | Admit: 2012-06-28 | Discharge: 2012-07-01 | Disposition: A | Discharge status: Home / Self Care | Payer: Medicare Other | Source: Ambulatory Visit | Attending: Internal Medicine | Admitting: Internal Medicine

## 2012-06-28 DIAGNOSIS — R071 Chest pain on breathing: Secondary | ICD-10-CM | POA: Insufficient documentation

## 2012-06-28 DIAGNOSIS — K219 Gastro-esophageal reflux disease without esophagitis: Secondary | ICD-10-CM | POA: Diagnosis not present

## 2012-06-28 DIAGNOSIS — I4891 Unspecified atrial fibrillation: Secondary | ICD-10-CM

## 2012-06-28 DIAGNOSIS — Y831 Surgical operation with implant of artificial internal device as the cause of abnormal reaction of the patient, or of later complication, without mention of misadventure at the time of the procedure: Secondary | ICD-10-CM | POA: Insufficient documentation

## 2012-06-28 DIAGNOSIS — T82190A Other mechanical complication of cardiac electrode, initial encounter: Secondary | ICD-10-CM | POA: Insufficient documentation

## 2012-06-28 DIAGNOSIS — I495 Sick sinus syndrome: Secondary | ICD-10-CM | POA: Diagnosis not present

## 2012-06-28 DIAGNOSIS — Z7901 Long term (current) use of anticoagulants: Secondary | ICD-10-CM | POA: Insufficient documentation

## 2012-06-28 DIAGNOSIS — E785 Hyperlipidemia, unspecified: Secondary | ICD-10-CM | POA: Insufficient documentation

## 2012-06-28 DIAGNOSIS — I509 Heart failure, unspecified: Secondary | ICD-10-CM | POA: Diagnosis not present

## 2012-06-28 DIAGNOSIS — N400 Enlarged prostate without lower urinary tract symptoms: Secondary | ICD-10-CM | POA: Insufficient documentation

## 2012-06-28 DIAGNOSIS — M545 Low back pain: Secondary | ICD-10-CM

## 2012-06-28 DIAGNOSIS — I442 Atrioventricular block, complete: Secondary | ICD-10-CM | POA: Insufficient documentation

## 2012-06-28 DIAGNOSIS — I428 Other cardiomyopathies: Secondary | ICD-10-CM | POA: Insufficient documentation

## 2012-06-28 DIAGNOSIS — I5022 Chronic systolic (congestive) heart failure: Secondary | ICD-10-CM | POA: Insufficient documentation

## 2012-06-28 DIAGNOSIS — I2510 Atherosclerotic heart disease of native coronary artery without angina pectoris: Secondary | ICD-10-CM | POA: Diagnosis not present

## 2012-06-28 DIAGNOSIS — G8918 Other acute postprocedural pain: Secondary | ICD-10-CM | POA: Diagnosis not present

## 2012-06-28 DIAGNOSIS — M353 Polymyalgia rheumatica: Secondary | ICD-10-CM

## 2012-06-28 DIAGNOSIS — R918 Other nonspecific abnormal finding of lung field: Secondary | ICD-10-CM | POA: Diagnosis not present

## 2012-06-28 DIAGNOSIS — K4090 Unilateral inguinal hernia, without obstruction or gangrene, not specified as recurrent: Secondary | ICD-10-CM

## 2012-06-28 DIAGNOSIS — H906 Mixed conductive and sensorineural hearing loss, bilateral: Secondary | ICD-10-CM

## 2012-06-28 DIAGNOSIS — R972 Elevated prostate specific antigen [PSA]: Secondary | ICD-10-CM

## 2012-06-28 DIAGNOSIS — Z950 Presence of cardiac pacemaker: Secondary | ICD-10-CM

## 2012-06-28 HISTORY — PX: PERMANENT PACEMAKER INSERTION: SHX5480

## 2012-06-28 HISTORY — DX: Unspecified osteoarthritis, unspecified site: M19.90

## 2012-06-28 HISTORY — DX: Presence of cardiac pacemaker: Z95.0

## 2012-06-28 LAB — PROTIME-INR
INR: 1.62 — ABNORMAL HIGH (ref 0.00–1.49)
Prothrombin Time: 18.7 seconds — ABNORMAL HIGH (ref 11.6–15.2)

## 2012-06-28 SURGERY — PERMANENT PACEMAKER INSERTION
Anesthesia: LOCAL

## 2012-06-28 MED ORDER — MIDAZOLAM HCL 5 MG/5ML IJ SOLN
INTRAMUSCULAR | Status: AC
  Filled 2012-06-28: qty 5

## 2012-06-28 MED ORDER — FENTANYL CITRATE 0.05 MG/ML IJ SOLN
INTRAMUSCULAR | Status: AC
  Filled 2012-06-28: qty 2

## 2012-06-28 MED ORDER — CARVEDILOL 12.5 MG PO TABS
12.5000 mg | ORAL_TABLET | Freq: Two times a day (BID) | ORAL | Status: DC
  Administered 2012-06-29 – 2012-07-01 (×4): 12.5 mg via ORAL
  Filled 2012-06-28 (×7): qty 1

## 2012-06-28 MED ORDER — SODIUM CHLORIDE 0.9 % IV SOLN
INTRAVENOUS | Status: DC
  Administered 2012-06-28: 13:00:00 via INTRAVENOUS

## 2012-06-28 MED ORDER — PANTOPRAZOLE SODIUM 40 MG PO TBEC
40.0000 mg | DELAYED_RELEASE_TABLET | ORAL | Status: DC
  Administered 2012-06-30: 40 mg via ORAL
  Filled 2012-06-28 (×2): qty 1

## 2012-06-28 MED ORDER — WARFARIN SODIUM 2.5 MG PO TABS
2.5000 mg | ORAL_TABLET | ORAL | Status: DC
  Administered 2012-06-28 – 2012-06-30 (×2): 2.5 mg via ORAL
  Filled 2012-06-28 (×2): qty 1

## 2012-06-28 MED ORDER — WARFARIN SODIUM 5 MG PO TABS
5.0000 mg | ORAL_TABLET | ORAL | Status: DC
  Administered 2012-06-29: 5 mg via ORAL
  Filled 2012-06-28 (×2): qty 1

## 2012-06-28 MED ORDER — CEFAZOLIN SODIUM-DEXTROSE 2-3 GM-% IV SOLR
2.0000 g | Freq: Four times a day (QID) | INTRAVENOUS | Status: AC
  Administered 2012-06-28 – 2012-06-29 (×2): 2 g via INTRAVENOUS
  Filled 2012-06-28 (×3): qty 50

## 2012-06-28 MED ORDER — MUPIROCIN 2 % EX OINT
TOPICAL_OINTMENT | Freq: Once | CUTANEOUS | Status: DC
  Filled 2012-06-28: qty 22

## 2012-06-28 MED ORDER — WARFARIN SODIUM 2.5 MG PO TABS: 2.5000 mg | ORAL_TABLET | Freq: Every day | ORAL | Status: DC

## 2012-06-28 MED ORDER — KETOROLAC TROMETHAMINE 30 MG/ML IJ SOLN
30.0000 mg | Freq: Four times a day (QID) | INTRAMUSCULAR | Status: AC | PRN
  Administered 2012-06-28 – 2012-06-29 (×2): 30 mg via INTRAVENOUS
  Filled 2012-06-28 (×3): qty 1

## 2012-06-28 MED ORDER — LIDOCAINE HCL (PF) 1 % IJ SOLN
INTRAMUSCULAR | Status: AC
  Filled 2012-06-28: qty 60

## 2012-06-28 MED ORDER — ACETAMINOPHEN 325 MG PO TABS
325.0000 mg | ORAL_TABLET | ORAL | Status: DC | PRN
  Administered 2012-06-28 – 2012-07-01 (×5): 650 mg via ORAL
  Filled 2012-06-28 (×5): qty 2

## 2012-06-28 MED ORDER — DIGOXIN 125 MCG PO TABS
125.0000 ug | ORAL_TABLET | Freq: Every day | ORAL | Status: DC
  Administered 2012-06-30 – 2012-07-01 (×2): 125 ug via ORAL
  Filled 2012-06-28 (×3): qty 1

## 2012-06-28 MED ORDER — AMIODARONE HCL 200 MG PO TABS
200.0000 mg | ORAL_TABLET | Freq: Every day | ORAL | Status: DC
  Administered 2012-06-30 – 2012-07-01 (×2): 200 mg via ORAL
  Filled 2012-06-28 (×3): qty 1

## 2012-06-28 MED ORDER — FINASTERIDE 5 MG PO TABS
5.0000 mg | ORAL_TABLET | ORAL | Status: DC
  Administered 2012-06-30: 5 mg via ORAL
  Filled 2012-06-28 (×4): qty 1

## 2012-06-28 MED ORDER — ONDANSETRON HCL 4 MG/2ML IJ SOLN: 4.0000 mg | Freq: Four times a day (QID) | INTRAMUSCULAR | Status: DC | PRN

## 2012-06-28 MED ORDER — WARFARIN - PHYSICIAN DOSING INPATIENT: Freq: Every day | Status: DC

## 2012-06-28 NOTE — H&P (Signed)
[name] is an 79 y.o. male.   Chief Complaint: "I am here for a new PPM" HPI: The patient is an 78 yo man who developed a PPM infection 7 years after implant. He underwent successful extraction of a right sided system several weeks ago. He has an EF of 40-45% and class 1 CHF. He has underlying CHB without a significant ventricular escape. He has had worsening CHF symptoms over the past few weeks while he has been at home recovering from his lead extraction and temporary permanent pacemker insertion.  Past Medical History  Diagnosis Date  . Coronary artery disease   . Cardiomyopathy primary-nonischemic EF 45%  . Hyperlipidemia   . GERD (gastroesophageal reflux disease)   . Polymyalgia rheumatica   . Sick sinus syndrome   . Atrial fibrillation   . Dysphagia   . Hearing loss, mixed, bilateral   . Increased prostate specific antigen (PSA) velocity   . Lumbar back pain   . BPH (benign prostatic hypertrophy)   . Breast mass   . CHF (congestive heart failure)   . Umbilical hernia   . Right inguinal hernia   . Elevated liver enzymes   . Esophageal stricture   . Dyslipidemia     Past Surgical History  Procedure Laterality Date  . Cataract extraction      left  . Umbilical hernia repair    . Insert / replace / remove pacemaker    . Hernia repair  2012    [facility]  . Tonsilectomy, adenoidectomy, bilateral myringotomy and tubes    . Cardioversion  06/17/2011    Procedure: CARDIOVERSION;  Surgeon: [name], MD;  Location: [facility] OR;  Service: Cardiovascular;  Laterality: N/A;  . Cardioversion  09/09/2011    Procedure: CARDIOVERSION;  Surgeon: Luis Abed, MD;  Location: [facility] OR;  Service: Cardiovascular;  Laterality: N/A;  . Cardioversion  01/01/2012    Procedure: CARDIOVERSION;  Surgeon: Herby Abraham, MD;  Location: [facility] ENDOSCOPY;  Service: Cardiovascular;  Laterality: N/A;  . Tee without cardioversion  01/01/2012    Procedure: TRANSESOPHAGEAL ECHOCARDIOGRAM (TEE);   Surgeon: Pricilla Riffle, MD;  Location: [facility] ENDOSCOPY;  Service: Cardiovascular;  Laterality: N/A;  . Eye surgery    . Generator removal Left 06/15/2012    Procedure: GENERATOR REMOVAL;  Surgeon: Marinus Maw, MD;  Location: [facility] OR;  Service: Cardiovascular;  Laterality: Left;  . Pacemaker generator change N/A 06/15/2012    Procedure: PACEMAKER GENERATOR CHANGE;  Surgeon: Marinus Maw, MD;  Location: [facility] OR;  Service: Cardiovascular;  Laterality: N/A;    Family History  Problem Relation Age of Onset  . Heart attack Father 51    deceased  . Aneurysm Mother     deceased AAA  . Hypertension Mother   . Other Brother     deceased at birth   Social History:  reports that he quit smoking about 29 years ago. His smoking use included Cigarettes. He smoked 0.00 packs per day. He has never used smokeless tobacco. He reports that  drinks alcohol. He reports that he does not use illicit drugs.  Allergies:  Allergies  Allergen Reactions  . Antihistamines, Diphenhydramine-Type Other (See Comments)    Causes difficulty in ability to urinate.    Medications Prior to Admission  Medication Sig Dispense Refill  . acetaminophen (TYLENOL) 500 MG tablet Take 500 mg by mouth daily as needed for pain.      Marland Kitchen amiodarone (PACERONE) 200 MG tablet Take 1 tablet (  200 mg total) by mouth daily.  90 tablet  3  . carvedilol (COREG) 12.5 MG tablet Take 12.5 mg by mouth 2 (two) times daily with a meal.      . digoxin (LANOXIN) 0.25 MG tablet Take 0.5 tablets (125 mcg total) by mouth daily.  90 tablet  3  . finasteride (PROSCAR) 5 MG tablet Take 5 mg by mouth 4 (four) times a week. Monday, Tuesday, Thursday, Saturday      . fluticasone (FLONASE) 50 MCG/ACT nasal spray Place 2 sprays into the nose daily.  16 g  3  . magnesium oxide (MAG-OX) 400 MG tablet Take 400 mg by mouth daily.      . Multiple Vitamins-Minerals (MULTIVITAMIN WITH MINERALS) tablet Take 1 tablet by mouth daily.        . pantoprazole (PROTONIX) 40 MG  tablet Take 40 mg by mouth 4 (four) times a week. Monday, Tuesday, Thursday and Saturday      . pravastatin (PRAVACHOL) 80 MG tablet Take 1 tablet (80 mg total) by mouth daily.  90 tablet  3  . warfarin (COUMADIN) 5 MG tablet Take 2.5-5 mg by mouth daily. 1 tablet (5 mg) on Monday, Wednesday and Friday, 1/2 tablet (2.5 mg) on all other days      . cephALEXin (KEFLEX) 500 MG capsule Take 1 capsule (500 mg total) by mouth 2 (two) times daily. For 7 days then stop.  14 capsule  0    Results for orders placed during the hospital encounter of 06/28/12 (from the past 48 hour(s))  PROTIME-INR     Status: Abnormal   Collection Time    06/28/12 12:10 PM      Result Value Range   Prothrombin Time 18.7 (*) 11.6 - 15.2 seconds   INR 1.62 (*) 0.00 - 1.49   No results found.  ROS All systems reviewed and negative except as noted in the HPI  Physical Exam  Blood pressure 108/71, pulse 70, temperature 98 F (36.7 C), temperature source Oral, resp. rate 20, height 5\' 7"  (1.702 m), weight 170 lb (77.111 kg), SpO2 95.00%. Well appearing elderly man, NAD HEENT: Unremarkable Neck:  8 cm JVD, no thyromegally Back:  No CVA tenderness Lungs:  Clear with no wheezes and temp Perm PM in place HEART:  Regular rate rhythm, no murmurs, no rubs, no clicks Abd:  Flat, positive bowel sounds, no organomegally, no rebound, no guarding Ext:  2 plus pulses, no edema, no cyanosis, no clubbing Skin:  No rashes no nodules Neuro:  CN II through XII intact, motor grossly intact  EKG: pending.  Assessment/Plan 1. PPM system infection 2. S/p extraction and insertion of a temp/perm pm 3. CHB  4. Chronic systolic CHF, EF 86% Rec: after review of all the data I would recommend insertion of a BiV PPM. I have discussed the risks/benefits/goals/expectations of device insertion and he wishes to proceed.  Gregg Taylor,M.D.  [name] 06/28/2012, 1:21 PM

## 2012-06-28 NOTE — Progress Notes (Signed)
Pt had a negative PCR 2 weeks ago.  NOne needed today per protocol

## 2012-06-28 NOTE — Progress Notes (Signed)
Called because pt began having chest pain, 5/10. Is s/p biV-PPM today. It is worse with inspiration, but is becoming more constant. Vitals stable including HR, BP, O2 sat. Will give toradol 30mg  IV now and check stat CXR. Dr. Ladona Ridgel made aware and plans to assess with quick bedside echo. Nurse made aware of plan. Patricie Geeslin PA-C

## 2012-06-28 NOTE — Op Note (Signed)
BIV PPM insertion via the left subclavian vein without immediate complication. [id number], [id number].

## 2012-06-29 ENCOUNTER — Ambulatory Visit (HOSPITAL_COMMUNITY): Payer: Medicare Other

## 2012-06-29 ENCOUNTER — Encounter (HOSPITAL_COMMUNITY)
Admission: RE | Disposition: A | Discharge status: Home / Self Care | Payer: Self-pay | Source: Ambulatory Visit | Attending: Internal Medicine

## 2012-06-29 DIAGNOSIS — I5022 Chronic systolic (congestive) heart failure: Secondary | ICD-10-CM | POA: Diagnosis not present

## 2012-06-29 DIAGNOSIS — T82190A Other mechanical complication of cardiac electrode, initial encounter: Secondary | ICD-10-CM | POA: Diagnosis not present

## 2012-06-29 DIAGNOSIS — I509 Heart failure, unspecified: Secondary | ICD-10-CM | POA: Diagnosis not present

## 2012-06-29 DIAGNOSIS — I495 Sick sinus syndrome: Secondary | ICD-10-CM | POA: Diagnosis not present

## 2012-06-29 DIAGNOSIS — I2510 Atherosclerotic heart disease of native coronary artery without angina pectoris: Secondary | ICD-10-CM | POA: Diagnosis not present

## 2012-06-29 DIAGNOSIS — I428 Other cardiomyopathies: Secondary | ICD-10-CM | POA: Diagnosis not present

## 2012-06-29 DIAGNOSIS — J90 Pleural effusion, not elsewhere classified: Secondary | ICD-10-CM | POA: Diagnosis not present

## 2012-06-29 DIAGNOSIS — I442 Atrioventricular block, complete: Secondary | ICD-10-CM | POA: Diagnosis not present

## 2012-06-29 DIAGNOSIS — J9819 Other pulmonary collapse: Secondary | ICD-10-CM | POA: Diagnosis not present

## 2012-06-29 HISTORY — PX: LEAD REVISION: SHX5945

## 2012-06-29 LAB — PROTIME-INR
INR: 1.78 — ABNORMAL HIGH (ref 0.00–1.49)
Prothrombin Time: 20.1 seconds — ABNORMAL HIGH (ref 11.6–15.2)

## 2012-06-29 SURGERY — LEAD REVISION
Anesthesia: LOCAL

## 2012-06-29 MED ORDER — MIDAZOLAM HCL 5 MG/5ML IJ SOLN
INTRAMUSCULAR | Status: AC
  Filled 2012-06-29: qty 5

## 2012-06-29 MED ORDER — ONDANSETRON HCL 4 MG/2ML IJ SOLN: 4.0000 mg | Freq: Four times a day (QID) | INTRAMUSCULAR | Status: DC | PRN

## 2012-06-29 MED ORDER — LIDOCAINE HCL (PF) 1 % IJ SOLN
INTRAMUSCULAR | Status: AC
  Filled 2012-06-29: qty 30

## 2012-06-29 MED ORDER — FENTANYL CITRATE 0.05 MG/ML IJ SOLN
INTRAMUSCULAR | Status: AC
  Filled 2012-06-29: qty 2

## 2012-06-29 MED ORDER — CEFAZOLIN SODIUM-DEXTROSE 2-3 GM-% IV SOLR
2.0000 g | INTRAVENOUS | Status: DC
  Filled 2012-06-29: qty 50

## 2012-06-29 MED ORDER — SODIUM CHLORIDE 0.9 % IR SOLN
80.0000 mg | Status: DC
  Filled 2012-06-29: qty 2

## 2012-06-29 MED ORDER — ACETAMINOPHEN 325 MG PO TABS: 325.0000 mg | ORAL_TABLET | ORAL | Status: DC | PRN

## 2012-06-29 MED ORDER — LIDOCAINE HCL (PF) 1 % IJ SOLN
INTRAMUSCULAR | Status: AC
  Filled 2012-06-29: qty 60

## 2012-06-29 MED ORDER — CHLORHEXIDINE GLUCONATE 4 % EX LIQD
60.0000 mL | Freq: Once | CUTANEOUS | Status: DC
  Filled 2012-06-29: qty 60

## 2012-06-29 MED ORDER — CEFAZOLIN SODIUM 1-5 GM-% IV SOLN
1.0000 g | Freq: Four times a day (QID) | INTRAVENOUS | Status: AC
  Administered 2012-06-29 – 2012-06-30 (×3): 1 g via INTRAVENOUS
  Filled 2012-06-29 (×3): qty 50

## 2012-06-29 MED ORDER — SODIUM CHLORIDE 0.9 % IV SOLN
INTRAVENOUS | Status: DC
  Administered 2012-06-29: 09:00:00 via INTRAVENOUS

## 2012-06-29 MED ORDER — SODIUM CHLORIDE 0.9 % IV SOLN
INTRAVENOUS | Status: AC
Start: 2012-06-29 — End: 2012-06-29

## 2012-06-29 NOTE — Op Note (Signed)
[name], [name]  [id number]  MEDICAL RECORD NO.:  [id number]  LOCATION:  2034                         FACILITY:  [facility]  PHYSICIAN:  [name]. Ladona Ridgel, MD    DATE OF BIRTH:  1930-02-19  DATE OF PROCEDURE:  06/28/2012 DATE OF DISCHARGE:                              OPERATIVE REPORT   PROCEDURE PERFORMED:  Insertion of a biventricular pacemaker.  INDICATION:  Status post prior pacemaker insertion with a pocket infection status post extraction with a temporary permanent pacemaker insertion in the setting of left ventricular dysfunction, ejection fraction 40% and class I heart failure.  INTRODUCTION:  The patient is an 79 year old man, who has a history of complete heart block and is status post pacemaker insertion.  Seven years after his initial implant, he developed a pocket infection and underwent successful extraction several weeks ago.  He has an ejection fraction of 40% by echo, and class I heart failure symptoms and is now referred for insertion of a biventricular pacemaker.  DESCRIPTION OF PROCEDURE:  After informed consent was obtained, the patient was taken to the diagnostic EP lab in a fasting state.  After usual preparation and draping, intravenous fentanyl and midazolam was given for sedation.  The patient's underlying rhythm was complete heart block with a ventricular escape of less than 30 beats per minute.  This indicated that he would be pacing in the ventricle approximately 100% of the time.  A 30 mL of lidocaine was infiltrated into the left infraclavicular region.  A 5-cm incision was carried out over this region.  Electrocautery was utilized to dissect down to the fascial plane.  The left subclavian vein was then punctured x3 and the St. Jude model 2088T, 58 cm active fixation pacing lead, serial number [id number] was advanced into the right ventricle and a St. Jude model 2088T, 52 cm active fixation pacing lead, serial number [id number]  [id number] was advanced into the right atrium.  Mapping was carried out first in the right ventricle and at the final site, the R-waves measured 20 mV and the pacing threshold was initially 1.7 V at 0.4 milliseconds with the lead actively fixed.  There was a large injury current.  The pacing impedance was 650 ohms.  With these satisfactory parameters, attention was turned to the atrial lead, was placed in anterolateral portion of the right atrium where P-waves measured 1.5 mV and the pacing threshold was 1.5 V at 0.4 milliseconds again with a large injury current.  Again, 10 V pacing did not stimulate the diaphragm.  The pacing impedance was 400 ohms.  With these satisfactory parameters, attention was then turned to placement of the left ventricular lead.  A coronary sinus guiding catheter along with a 6-French hexapolar EP catheter was advanced into the coronary sinus and venography was carried out.  This demonstrated the posterolateral vein with 2 large branches.  The superior branch was initially chosen, but diaphragmatic stimulation was present down to 4 V.  The inferior branch was then cannulated and the St. Jude Quick flex 1258T, 86 cm bipolar pacing lead, serial number [id number] was advanced into the inferior branch of the posterolateral vein.  In this location, there was  no diaphragmatic stimulation, and the pacing threshold was around 2 V at 0.4 milliseconds.  The pacing impedance was 1100 ohms.  R-waves were 13. With these satisfactory parameters, the guiding catheter was liberated from the lead in the usual manner.  The leads were then secured to the subpectoral fascia with a figure-of-eight silk suture.  The sewing sleeve was secured with silk suture.  Electrocautery was utilized to make a subcutaneous pocket.  Antibiotic irrigation was utilized to irrigate the pocket.  Electrocautery was utilized to assure hemostasis. The St. Jude Anthem RF dual-chamber biventricular pacemaker  serial number [id number] was connected to the atrial RV and LV leads and placed back in the subcutaneous pocket.  The pocket was irrigated with antibiotic irrigation and the incision was closed with 2-0 and 3-0 Vicryl.  Benzoin and Steri-Strips were painted on the skin.  A pressure dressing was applied.  The patient was returned to his room in satisfactory condition.  COMPLICATIONS:  There no immediate procedure complications.  RESULTS:  This demonstrate successful implantation of a St. Jude biventricular pacemaker.  There were no immediate procedure complications.     [name]. Ladona Ridgel, MD     GWT/MEDQ  D:  06/28/2012  T:  06/29/2012  Job:  [id number]

## 2012-06-29 NOTE — H&P (View-Only) (Signed)
Patient: [name] Date of Encounter: 06/29/2012, 7:22 AM Admit date: 06/28/2012     Subjective  [name] reports mild incisional soreness this AM. He is also "sore" at the right chest wall near the mid-axillary line, which started last night and seems worse with inspiration. He reports it has "eased up" this AM but is still present. He denies SOB.    Objective  Physical Exam: Vitals: BP 101/54  Pulse 62  Temp(Src) 98.5 F (36.9 C) (Oral)  Resp 20  Ht 5\' 7"  (1.702 m)  Wt 170 lb (77.111 kg)  BMI 26.62 kg/m2  SpO2 93% General: Well developed, well appearing 79 year old male in no acute distress. Neck: Supple. JVD not elevated. Lungs: Clear bilaterally to auscultation without wheezes, rales, or rhonchi. Breathing is unlabored. Heart: RRR S1 S2 without murmur, rub or gallop. No friction rub. Abdomen: Soft, non-distended. Extremities: No clubbing or cyanosis. No edema.  Distal pedal pulses are 2+ and equal bilaterally. Neuro: Alert and oriented X 3. Moves all extremities spontaneously. No focal deficits. Skin: Left upper chest/implant site intact without significant bleeding or hematoma. Right upper chest well healing incision without signs of bleeding, inflammation or infection.   Intake/Output:  Intake/Output Summary (Last 24 hours) at 06/29/12 1610 Last data filed at 06/29/12 0200  Gross per 24 hour  Intake      0 ml  Output    350 ml  Net   -350 ml    Inpatient Medications:  . amiodarone  200 mg Oral Daily  . carvedilol  12.5 mg Oral BID WC  .  ceFAZolin (ANCEF) IV  2 g Intravenous Q6H  . digoxin  125 mcg Oral Daily  . finasteride  5 mg Oral 4 times weekly  . pantoprazole  40 mg Oral 4 times weekly  . warfarin  2.5 mg Oral 4 times weekly  . warfarin  5 mg Oral 3 times weekly  . Warfarin - Physician Dosing Inpatient   Does not apply q1800    Labs:  Recent Labs  06/28/12 1210  INR 1.62*    Radiology/Studies: Dg Chest 1 View  06/28/2012   *RADIOLOGY REPORT*  Clinical Data: Chest pain.  Status post pacer placement.  CHEST - 1 VIEW  Comparison: 06/16/2012  Findings: Left chest wall pacer device is noted with leads in the right atrial appendage, coronary sinus and right ventricle.  No pneumothorax is identified.  There is mild cardiac enlargement. Mild pulmonary venous congestion is noted.  There is opacity in the right base which may represent infiltrate, aspiration or atelectasis.  IMPRESSION:  1.  No pneumothorax after pacer placement. 2.  Right lung base opacity which may represent infiltrate, aspiration or atelectasis. 3.  Pulmonary venous congestion.   Original Report Authenticated By: Signa Kell, M.D.     Device interrogation:performed by industry this AM shows normal BiV PPM function Telemetry: V paced    Assessment and Plan  1. CHB s/p BiV PPM (CRT-P) implant and temp-perm expant yesterday [name] is having right sided chest/flank pain since last night. Dr. Ladona Ridgel reviewed his CXR and there is concern he may have microperforation involving the atrial lead. Will probably need atrial lead revision today.  Please see Dr. Lubertha Basque recommendations below. Signed, Exie Parody  EP Attending Patient seen and examined. Agree with above and modifications. I have examined patient. He has pleuritic chest pain. His pacing and sensing numbers are stable. I have reviewed his CXR. He almost certainly  has a micro-perforation. Will make NPO and I have spoken to Dr. Graciela Husbands. Will revise his atrial and ventricular leads today (atrial lead is likely culprit but cannot know for sure.) Hopefully discharge tomorrow. [name],M.D.    Seen and reviewed benefits and risks incl infection  Will use antimicrobial pouch and anticipate repositioning of both leads

## 2012-06-29 NOTE — Progress Notes (Addendum)
   Patient: [name] Date of Encounter: 06/29/2012, 7:22 AM Admit date: 06/28/2012     Subjective  Mr. Michelotti reports mild incisional soreness this AM. He is also "sore" at the right chest wall near the mid-axillary line, which started last night and seems worse with inspiration. He reports it has "eased up" this AM but is still present. He denies SOB.    Objective  Physical Exam: Vitals: BP 101/54  Pulse 62  Temp(Src) 98.5 F (36.9 C) (Oral)  Resp 20  Ht 5' 7" (1.702 m)  Wt 170 lb (77.111 kg)  BMI 26.62 kg/m2  SpO2 93% General: Well developed, well appearing 79 year old male in no acute distress. Neck: Supple. JVD not elevated. Lungs: Clear bilaterally to auscultation without wheezes, rales, or rhonchi. Breathing is unlabored. Heart: RRR S1 S2 without murmur, rub or gallop. No friction rub. Abdomen: Soft, non-distended. Extremities: No clubbing or cyanosis. No edema.  Distal pedal pulses are 2+ and equal bilaterally. Neuro: Alert and oriented X 3. Moves all extremities spontaneously. No focal deficits. Skin: Left upper chest/implant site intact without significant bleeding or hematoma. Right upper chest well healing incision without signs of bleeding, inflammation or infection.   Intake/Output:  Intake/Output Summary (Last 24 hours) at 06/29/12 0722 Last data filed at 06/29/12 0200  Gross per 24 hour  Intake      0 ml  Output    350 ml  Net   -350 ml    Inpatient Medications:  . amiodarone  200 mg Oral Daily  . carvedilol  12.5 mg Oral BID WC  .  ceFAZolin (ANCEF) IV  2 g Intravenous Q6H  . digoxin  125 mcg Oral Daily  . finasteride  5 mg Oral 4 times weekly  . pantoprazole  40 mg Oral 4 times weekly  . warfarin  2.5 mg Oral 4 times weekly  . warfarin  5 mg Oral 3 times weekly  . Warfarin - Physician Dosing Inpatient   Does not apply q1800    Labs:  Recent Labs  06/28/12 1210  INR 1.62*    Radiology/Studies: Dg Chest 1 View  06/28/2012   *RADIOLOGY REPORT*  Clinical Data: Chest pain.  Status post pacer placement.  CHEST - 1 VIEW  Comparison: 06/16/2012  Findings: Left chest wall pacer device is noted with leads in the right atrial appendage, coronary sinus and right ventricle.  No pneumothorax is identified.  There is mild cardiac enlargement. Mild pulmonary venous congestion is noted.  There is opacity in the right base which may represent infiltrate, aspiration or atelectasis.  IMPRESSION:  1.  No pneumothorax after pacer placement. 2.  Right lung base opacity which may represent infiltrate, aspiration or atelectasis. 3.  Pulmonary venous congestion.   Original Report Authenticated By: Taylor Stroud, M.D.     Device interrogation:performed by industry this AM shows normal BiV PPM function Telemetry: V paced    Assessment and Plan  1. CHB s/p BiV PPM (CRT-P) implant and temp-perm expant yesterday Mr. Tews is having right sided chest/flank pain since last night. Dr. Taylor reviewed his CXR and there is concern he may have microperforation involving the atrial lead. Will probably need atrial lead revision today.  Please see Dr. Taylor's recommendations below. Signed, EDMISTEN, BROOKE PA-C  EP Attending Patient seen and examined. Agree with above and modifications. I have examined patient. He has pleuritic chest pain. His pacing and sensing numbers are stable. I have reviewed his CXR. He almost certainly   has a micro-perforation. Will make NPO and I have spoken to Dr. Klein. Will revise his atrial and ventricular leads today (atrial lead is likely culprit but cannot know for sure.) Hopefully discharge tomorrow. Gregg Taylor,M.D.    Seen and reviewed benefits and risks incl infection  Will use antimicrobial pouch and anticipate repositioning of both leads 

## 2012-06-29 NOTE — Op Note (Signed)
NAMELUIAN, SCHUMPERT NO.:  [id number]  MEDICAL RECORD NO.:  [id number]  LOCATION:  2034                         FACILITY:  [facility]  PHYSICIAN:  [name]. Ladona Ridgel, MD    DATE OF BIRTH:  [birth year]  DATE OF PROCEDURE:  06/28/2012 DATE OF DISCHARGE:                              OPERATIVE REPORT   PROCEDURE PERFORMED:  Explantation of a temporary permanent transvenous pacemaker.  INTRODUCTION:  The patient is an 79 year old man who underwent temporary permanent transvenous pacemaker insertion several weeks ago, after a pacemaker system extraction.  He had underlying complete heart block. He has undergone insertion of a new biventricular pacemaker.  The results of which have been dictated on a separate note.  Now, that the new biventricular pacemaker and is referred for removal of his temporary permanent transvenous pacemaker.  After informed was obtained, the patient was prepped in the usual manner.  An old temporary pacemaker was disconnected from the temporary permanent transvenous pacing lead.  The silk sutures were disconnected from the pacing lead itself.  These were removed.  A stylet was advanced part way into the body of the lead and the helix was retracted in total. The lead was then retracted out of the heart and out of the body without difficulty.  Pressure was placed.  A bandage was placed and the patient was returned to the holding area in satisfactory condition.  COMPLICATIONS:  There were no immediate procedure complications.  RESULTS:  This demonstrate successful removal of a temporary permanent transvenous pacing system without immediate procedure complication following insertion of a biventricular pacemaker.     [name]. Ladona Ridgel, MD     GWT/MEDQ  D:  06/28/2012  T:  06/29/2012  Job:  [id number]

## 2012-06-29 NOTE — Telephone Encounter (Signed)
INR's have been sub therapeutic ok to take his Coumadin

## 2012-06-29 NOTE — CV Procedure (Signed)
Lead revision of RA and RV lead 2/2 pleuritic chest pain   Pain relieved with repositioning of RA lead   After routine prep and drape, lidocaine was infiltrated in the prepectoral subclavicular region on the left side an incision was made and carried down to later the prepectoral fascia using electrocautery and sharp dissection a pocket was formed similarly. Hemostasis was obtained.    The ventricular lead was remanipulated to the right ventricular apex with a paced  bipolar R wave was 14.7, the pacing impedance was  489, the threshold was 0.7 @ 0.4 msec  Current at threshold was   0.7  Ma and the current of injury was  brisk.  The right atrial lead was manipulated to the right atrial appendage  with a bipolar P-wave  2, the pacing impedance was 500, the threshold <2@ 0.4 msec     All the leads were resecured to the fascia, and antimicrobial pouch was used because of acute pocket reopening. The pouch was resecured    Hemostasis was obtained. The pocket was copiously irrigated with antibiotic containing saline solution. The leads and the pulse generator were placed in the pocket and affixed to the prepectoral fascia. The wound was then closed in 3 layers in the normal fashion. The wound was washed dried and a benzoin Steri-Strip dressing was applied.  Needle  Count, sponge counts and instrument counts were correct at the end of the procedure .   The patient tolerated the procedure without apparent complication.  Gerlene Burdock.D.

## 2012-06-29 NOTE — Progress Notes (Signed)
06/29/2012 1330 Confirmed with Tammy RN that frequent vital signs were collected in cath lab holding area prior to return to floor.  Meron Bocchino, Blanchard Kelch

## 2012-06-29 NOTE — Interval H&P Note (Signed)
History and Physical Interval Note:  06/29/2012 8:27 AM  [name]  has presented today for surgery, with the diagnosis of a  The various methods of treatment have been discussed with the patient and family. After consideration of risks, benefits and other options for treatment, the patient has consented to  Procedure(s): LEAD REVISION (N/A) as a surgical intervention .  The patient's history has been reviewed, patient examined, no change in status, stable for surgery.  I have reviewed the patient's chart and labs.  Questions were answered to the patient's satisfaction.     Sherryl Manges

## 2012-06-30 ENCOUNTER — Encounter (HOSPITAL_COMMUNITY)
Admission: RE | Disposition: A | Discharge status: Home / Self Care | Payer: Self-pay | Source: Ambulatory Visit | Attending: Internal Medicine

## 2012-06-30 ENCOUNTER — Ambulatory Visit (HOSPITAL_COMMUNITY): Payer: Medicare Other

## 2012-06-30 DIAGNOSIS — I442 Atrioventricular block, complete: Secondary | ICD-10-CM | POA: Diagnosis not present

## 2012-06-30 DIAGNOSIS — I2510 Atherosclerotic heart disease of native coronary artery without angina pectoris: Secondary | ICD-10-CM | POA: Diagnosis not present

## 2012-06-30 DIAGNOSIS — I509 Heart failure, unspecified: Secondary | ICD-10-CM | POA: Diagnosis not present

## 2012-06-30 DIAGNOSIS — I369 Nonrheumatic tricuspid valve disorder, unspecified: Secondary | ICD-10-CM | POA: Diagnosis not present

## 2012-06-30 DIAGNOSIS — R918 Other nonspecific abnormal finding of lung field: Secondary | ICD-10-CM | POA: Diagnosis not present

## 2012-06-30 DIAGNOSIS — I5022 Chronic systolic (congestive) heart failure: Secondary | ICD-10-CM | POA: Diagnosis not present

## 2012-06-30 DIAGNOSIS — T82190A Other mechanical complication of cardiac electrode, initial encounter: Secondary | ICD-10-CM | POA: Diagnosis not present

## 2012-06-30 DIAGNOSIS — I428 Other cardiomyopathies: Secondary | ICD-10-CM | POA: Diagnosis not present

## 2012-06-30 HISTORY — PX: PACEMAKER REVISION: SHX5482

## 2012-06-30 LAB — PROTIME-INR
INR: 2.11 — ABNORMAL HIGH (ref 0.00–1.49)
Prothrombin Time: 22.8 seconds — ABNORMAL HIGH (ref 11.6–15.2)

## 2012-06-30 LAB — BASIC METABOLIC PANEL
BUN: 22 mg/dL (ref 6–23)
GFR calc Af Amer: 74 mL/min — ABNORMAL LOW (ref >90)
GFR calc non Af Amer: 64 mL/min — ABNORMAL LOW (ref >90)
Potassium: 4.7 mEq/L (ref 3.5–5.1)
Sodium: 138 mEq/L (ref 135–145)

## 2012-06-30 LAB — CBC
HCT: 35.1 % — ABNORMAL LOW (ref 39.0–52.0)
MCHC: 36.2 g/dL — ABNORMAL HIGH (ref 30.0–36.0)
RDW: 15.1 % (ref 11.5–15.5)

## 2012-06-30 SURGERY — PACEMAKER REVISION

## 2012-06-30 MED ORDER — MIDAZOLAM HCL 2 MG/2ML IJ SOLN
INTRAMUSCULAR | Status: AC
  Filled 2012-06-30: qty 2

## 2012-06-30 MED ORDER — SODIUM CHLORIDE 0.9 % IV SOLN
INTRAVENOUS | Status: DC
  Administered 2012-06-30: 15:00:00 via INTRAVENOUS

## 2012-06-30 MED ORDER — LIDOCAINE HCL (PF) 1 % IJ SOLN
INTRAMUSCULAR | Status: AC
  Filled 2012-06-30: qty 60

## 2012-06-30 MED ORDER — CEFAZOLIN SODIUM-DEXTROSE 2-3 GM-% IV SOLR
2.0000 g | INTRAVENOUS | Status: DC
  Filled 2012-06-30: qty 50

## 2012-06-30 MED ORDER — CEFAZOLIN SODIUM-DEXTROSE 2-3 GM-% IV SOLR
2.0000 g | Freq: Four times a day (QID) | INTRAVENOUS | Status: DC
  Filled 2012-06-30 (×3): qty 50

## 2012-06-30 MED ORDER — SODIUM CHLORIDE 0.9 % IR SOLN
80.0000 mg | Status: DC
  Filled 2012-06-30: qty 2

## 2012-06-30 MED ORDER — SODIUM CHLORIDE 0.9 % IR SOLN
Status: DC
  Filled 2012-06-30: qty 2

## 2012-06-30 MED ORDER — CEFAZOLIN SODIUM-DEXTROSE 2-3 GM-% IV SOLR
2.0000 g | Freq: Four times a day (QID) | INTRAVENOUS | Status: AC
  Administered 2012-06-30 – 2012-07-01 (×2): 2 g via INTRAVENOUS
  Filled 2012-06-30 (×2): qty 50

## 2012-06-30 MED ORDER — HYDROCODONE-ACETAMINOPHEN 5-325 MG PO TABS
1.0000 | ORAL_TABLET | ORAL | Status: DC | PRN
  Administered 2012-06-30 (×2): 1 via ORAL
  Filled 2012-06-30 (×2): qty 1

## 2012-06-30 MED ORDER — HEPARIN (PORCINE) IN NACL 2-0.9 UNIT/ML-% IJ SOLN
INTRAMUSCULAR | Status: AC
  Filled 2012-06-30: qty 500

## 2012-06-30 MED ORDER — ONDANSETRON HCL 4 MG/2ML IJ SOLN: 4.0000 mg | Freq: Four times a day (QID) | INTRAMUSCULAR | Status: DC | PRN

## 2012-06-30 MED ORDER — CHLORHEXIDINE GLUCONATE 4 % EX LIQD
60.0000 mL | Freq: Once | CUTANEOUS | Status: DC
  Filled 2012-06-30: qty 60

## 2012-06-30 NOTE — Progress Notes (Signed)
06/30/2012 1045 Pt. bp still low at 96/55. Pt. Asymptomatic. Brooke Edminsten PAC at bedside. Verbal orders received ok to administer A.M. Medications as scheduled. Orders received and enacted. Will continue monitor patient.  [name], Blanchard Kelch

## 2012-06-30 NOTE — Op Note (Signed)
PPM lead revision without immediate complication. [id number].

## 2012-06-30 NOTE — Progress Notes (Signed)
  Echocardiogram 2D Echocardiogram has been performed.  Ellender Hose A 06/30/2012, 11:21 AM

## 2012-06-30 NOTE — Progress Notes (Signed)
Patient: [name] Date of Encounter: 06/30/2012, 10:50 AM Admit date: 06/28/2012     Subjective  [name] reports constant CP that is worse than yesterday, worse with inspiration. He denies SOB.    Objective  Physical Exam: Vitals: BP 96/55  Pulse 60  Temp(Src) 98.5 F (36.9 C) (Oral)  Resp 19  Ht 5\' 7"  (1.702 m)  Wt 170 lb (77.111 kg)  BMI 26.62 kg/m2  SpO2 91% General: Well developed, well appearing 79 year old male in no acute distress. Neck: Supple. JVD not elevated. Lungs: Clear bilaterally to auscultation without wheezes, rales, or rhonchi. Breathing is unlabored. Heart: RRR S1 S2 without murmur or gallop. +Rub.  Abdomen: Soft, non-distended. Extremities: No clubbing or cyanosis. No edema.  Distal pedal pulses are 2+ and equal bilaterally. Neuro: Alert and oriented X 3. Moves all extremities spontaneously. No focal deficits. Skin: Left upper chest/implant site intact with dressing in place. No sign of significant bleeding or hematoma.  Intake/Output:  Intake/Output Summary (Last 24 hours) at 06/30/12 1050 Last data filed at 06/29/12 1700  Gross per 24 hour  Intake      0 ml  Output    250 ml  Net   -250 ml    Inpatient Medications:  . amiodarone  200 mg Oral Daily  . carvedilol  12.5 mg Oral BID WC  . digoxin  125 mcg Oral Daily  . finasteride  5 mg Oral 4 times weekly  . pantoprazole  40 mg Oral 4 times weekly  . warfarin  2.5 mg Oral 4 times weekly  . warfarin  5 mg Oral 3 times weekly  . Warfarin - Physician Dosing Inpatient   Does not apply q1800    Labs:  Recent Labs  06/30/12 0553  INR 2.11*    Radiology/Studies: Dg Chest 2 View  06/30/2012  *RADIOLOGY REPORT*  Clinical Data: Status post pacemaker placement unable to raise arm  CHEST - 2 VIEW  Comparison: 06/29/2012  Findings: The cardiac shadow is stable.  Pacemaker leads are again identified although the atrial lead appears different than that seen on the recent exam from  previous day.  Correlation with the patients clinical history is recommended.  Persistent increased change is noted in the medial aspect of the right lung base consistent with evolving infiltrate.  The left lung remains clear. No pneumothorax is noted.  The humeral head is somewhat high-riding on the left which may be related to an underlying rotator cuff injury.  IMPRESSION: Change in the appearance of the atrial lead when compared with the previous day.  Clinical correlation is recommended.  Increasing right basilar infiltrate.   Original Report Authenticated By: Alcide Clever, M.D.    Dg Chest 2 View  06/29/2012  *RADIOLOGY REPORT*  Clinical Data: Post pacemaker insertion  CHEST - 2 VIEW  Comparison: 06/28/2012  Findings: Left subclavian transvenous pacemaker leads project at right atrium, right ventricle, and coronary sinus. Upper normal heart size. Mediastinal contours and pulmonary vascularity normal. Bibasilar atelectasis and small right subpulmonic pleural effusion. Upper lungs clear. No pneumothorax or acute osseous findings.  IMPRESSION: Bibasilar atelectasis and small right pleural effusion.   Original Report Authenticated By: Ulyses Southward, M.D.     Telemetry: AV paced    Assessment and Plan  1. CHB s/p BiV PPM (CRT-P) implant and temp-perm expant on 06/28/2012 Complicated by microperforation s/p lead revision/repositioning yesterday; now with worsening CP and device interrogation this AM shows change in sensing measurements on  atrial lead; per Dr. Ladona Ridgel, plan for lead revision again today; +rub - will order limited echo this AM to rule out effusion; will check CBC and BMET (last labs on 04/17) 2. Abnormal CXR with "increasing right basilar infiltrate" - ? PNA, although no signs of PNA - afebrile, no cough, no worsening SOB or hypoxia; will follow 3. NICM with chronic systolic HF, EF 40-45% 4. PAF  Signed, Rick Duff PA-C  EP Attending  Patient seen and examined. He appears to have  experienced a second microperforation with new, and worsening mid strernal chest pain and worsening thresholds. I have discussed the treatment options and have recommended we revise his lead a second time. I have discussed the risks/benefits/goals/and expectations of the procedure and he wishes to proceed.  Leonia Reeves.D.

## 2012-07-01 ENCOUNTER — Ambulatory Visit (HOSPITAL_COMMUNITY): Payer: Medicare Other

## 2012-07-01 DIAGNOSIS — I442 Atrioventricular block, complete: Secondary | ICD-10-CM | POA: Diagnosis not present

## 2012-07-01 DIAGNOSIS — I5022 Chronic systolic (congestive) heart failure: Secondary | ICD-10-CM | POA: Diagnosis not present

## 2012-07-01 DIAGNOSIS — I509 Heart failure, unspecified: Secondary | ICD-10-CM | POA: Diagnosis not present

## 2012-07-01 DIAGNOSIS — J90 Pleural effusion, not elsewhere classified: Secondary | ICD-10-CM | POA: Diagnosis not present

## 2012-07-01 DIAGNOSIS — I2510 Atherosclerotic heart disease of native coronary artery without angina pectoris: Secondary | ICD-10-CM | POA: Diagnosis not present

## 2012-07-01 DIAGNOSIS — Z950 Presence of cardiac pacemaker: Secondary | ICD-10-CM

## 2012-07-01 DIAGNOSIS — I428 Other cardiomyopathies: Secondary | ICD-10-CM | POA: Diagnosis not present

## 2012-07-01 DIAGNOSIS — T82190A Other mechanical complication of cardiac electrode, initial encounter: Secondary | ICD-10-CM | POA: Diagnosis not present

## 2012-07-01 LAB — PROTIME-INR
INR: 2.59 — ABNORMAL HIGH (ref 0.00–1.49)
Prothrombin Time: 26.5 seconds — ABNORMAL HIGH (ref 11.6–15.2)

## 2012-07-01 NOTE — Op Note (Signed)
[name], LOFTUS NO.:  [id number]  MEDICAL RECORD NO.:  [id number]  LOCATION:  2034                         FACILITY:  [facility]  PHYSICIAN:  [name]. Ladona Ridgel, MD    DATE OF BIRTH:  May 10, 1929  DATE OF PROCEDURE:  06/30/2012 DATE OF DISCHARGE:                              OPERATIVE REPORT   PROCEDURE PERFORMED:  Atrial and ventricular pacing lead revision.  INTRODUCTION:  The patient is an 79 year old man who underwent biventricular pacemaker insertion 2 days ago.  Postoperatively, he developed pleuritic chest pain, and was felt to have a right atrial perforation microscopically.  He underwent atrial and ventricular lead revision yesterday and did well for several hours but then developed recurrent chest pain now midsternal in location.  Interrogation of his atrial lead demonstrated worsening numbers with reduced impedances and sensing in the atrium.  It was felt that he had another microperforation and he is now referred for removal of his old lead which had been used for the 1st revision and insertion of a new atrial and ventricular pacing lead.  PROCEDURE:  After informed was obtained, the patient was taken to the diagnostic EP lab in a fasting state.  After usual preparation and draping, intravenous midazolam was given for sedation.  A 30 mL of lidocaine was infiltrated into the left infraclavicular region.  A 5-cm incision was carried out over the old pacemaker incision and electrocautery was utilized to dissect down to the pacemaker pocket. The generator was removed and placed on antibiotics wrapped gauze.  The atrial and right ventricular leads were disconnected from the generator. They were disconnected from their sewing sleeve as well, and at this point, a new 7-French active fixation pacing lead, Medtronic model 5076, 58 cm, serial number [id number] was placed by way of puncture of the left subclavian vein into the right ventricle and a new Medtronic  5076, 52 cm active fixation pacing lead serial number [id number] was advanced into right atrium.  Once these leads were in place, the old [place] Jude leads were removed in total.  The left ventricular lead was allowed to remain in position.  The Medtronic ventricular lead was advanced into the right ventricular apex where R-waves measured 60, the impedance was 590 and the pacing threshold was 1.3 at 0.4 milliseconds.  There was large injury current.  10 V pacing not stimulate the diaphragm.  With the ventricular lead in satisfactory position, attention was then turned to placement of the atrial lead, which was placed in the anterior portion the right atrium.  It should be noted that the P-wave voltage was very low.  I have mapped at least 50 spots in the atrium and there was not a P-wave above 1 millivolt until we found the final site with the P-wave was initially 1.2 mV.  Once the lead was actively fixed the P- wave was 0.7 mV, the threshold was 1.3 at 0.4 milliseconds and the pacing impedance was 400 ohms with the lead actively fixed.  A satisfactory injury current was present.  At this point, the new atrial and ventricular leads were secured to the subpectoral fascia with a figure-of-eight silk suture and the sewing sleeve was secured with silk  suture.  Electrocautery was utilized to make subcutaneous pocket. Antibiotic irrigation was utilized to irrigate the pocket. Electrocautery was utilized to assure hemostasis.  The previously implanted [facility] biventricular pacemaker was reconnected to the atrial and new RV leads and placed back in the subcutaneous pocket.  The bacteriostatic pouch was placed over the generator as it had been placed previously the day before.  The pocket was irrigated with additional antibiotic irrigation.  The incision was closed with 2-0 and 3-0 Vicryl. Benzoin and Steri-Strips were painted on the skin, pressure dressing was applied, the patient was returned  to his room in satisfactory condition.  COMPLICATIONS:  There were no immediate procedure complications.  RESULTS:  This demonstrate successful pacemaker lead revision in a patient with recurrent atrial microperforations.     [name]. Ladona Ridgel, MD     GWT/MEDQ  D:  06/30/2012  T:  07/01/2012  Job:  [id number]

## 2012-07-01 NOTE — Progress Notes (Signed)
   ELECTROPHYSIOLOGY ROUNDING NOTE    Patient Name: [name] Date of Encounter: 07-01-2012    SUBJECTIVE:Patient feels well.  No chest pain or shortness of breath.  Minimal incisional soreness. S/p lead revisions 06-30-2012 for micro-perf.    TELEMETRY: Reviewed telemetry pt in AV pacing Filed Vitals:   06/30/12 1830 06/30/12 1900 06/30/12 1928 07/01/12 0630  BP: 108/56 103/44 109/58 99/50  Pulse: 60 60 60 66  Temp:    98.7 F (37.1 C)  TempSrc:    Oral  Resp:    18  Height:      Weight:      SpO2: 91%   93%    Intake/Output Summary (Last 24 hours) at 07/01/12 0715 Last data filed at 06/30/12 1300  Gross per 24 hour  Intake      0 ml  Output    150 ml  Net   -150 ml    LABS: Basic Metabolic Panel:  Recent Labs  14/78/29 1110  NA 138  K 4.7  CL 103  CO2 26  GLUCOSE 145*  BUN 22  CREATININE 1.06  CALCIUM 8.9   CBC:  Recent Labs  06/30/12 1110  WBC 26.1*  HGB 12.7*  HCT 35.1*  MCV 84.0  PLT 220   Radiology/Studies:  Pending  PHYSICAL EXAM Left chest without hematoma or ecchymosis.   DEVICE INTERROGATION: Device interrogated by industry.  Lead values including impedence, sensing, threshold within normal values.    Wound care, arm mobility, restrictions reviewed with patient.  Routine follow up scheduled.   EP Attending  Patient seen and examined. He denies chest pain or sob s/p a second lead revision. No fever. Will plan discharge later today with usual followup. Normal device function.  [name], M.D.

## 2012-07-01 NOTE — Discharge Summary (Cosign Needed)
ELECTROPHYSIOLOGY PROCEDURE DISCHARGE SUMMARY    Patient ID: [name],  MRN: [id number], DOB/AGE: April 07, 1929 79 y.o.  Admit date: 06/28/2012 Discharge date: 07/01/2012  Primary Care Physician: Birdie Sons, MD Primary Cardiologist: Shawnie Pons, MD Electrophysiologist: [name], MD  Primary Discharge Diagnosis:  SSS s/p removal of temp perm pacemaker and implantation of permanent pacemaker this admission  Secondary Discharge Diagnosis:  1.  Recent pacemaker system infection and device removal previously this month 2.  Atrial fibrillation 3.  Chronic anticoagulation with Warfarin 4.  NICM 5.  GERD 6.  Hyperlipidemia 7.  BPH  Procedures This Admission:  1.  Removal of temp perm pacemaker and implantation of a CRTP on 06-28-2012 by Dr Ladona Ridgel. The patient received a STJ [employer] pacemaker with model number 2088 right atrial and right ventricular leads and model number 1258 left ventricular lead. 2.  RA and RV lead revision on 06-29-2012 for presumed micro-perf by Dr Graciela Husbands. 3.  RA and RV lead revision on 06-30-2012 for presumed micro-perf by Dr Ladona Ridgel. During this procedure, the previously implanted model number 2088 RA lead was removed and replaced with a model number 5076 RA lead.   Brief HPI: The patient is an 79 year old man, who has a history of complete heart block and is status post pacemaker insertion. Seven years after his initial implant, he developed a pocket infection and  underwent successful extraction several weeks ago. He has an ejection fraction of 40% by echo, and class I heart failure symptoms and is now referred for insertion of a biventricular pacemaker.  Hospital Course:  The patient was admitted on 06-28-2012 for removal of temp perm pacemaker and insertion of CRTP.  This was carried out by Dr Ladona Ridgel with details as outlined above.  Following the procedure, the patient developed chest pain and underwent lead revisions the next morning by Dr Graciela Husbands.   Unfortunately, the patient had recurrent chest pain post lead revisions and underwent repeat lead revisions by Dr Ladona Ridgel on 06-30-2012.  Following that procedure, the patient had no chest pain or shortness of breath.  His left chest incision was without hematoma or ecchymosis.  CXR's demonstrated no pneumothorax status post device implantation and lead revisions.  His device was interrogated and found to be functioning normally.  Dr Ladona Ridgel examined the patient and considered him stable for discharge to home.   Discharge Vitals: Blood pressure 99/50, pulse 66, temperature 98.7 F (37.1 C), temperature source Oral, resp. rate 18, height 5\' 7"  (1.702 m), weight 170 lb (77.111 kg), SpO2 93.00%.    Labs:   Lab Results  Component Value Date   WBC 26.1* 06/30/2012   HGB 12.7* 06/30/2012   HCT 35.1* 06/30/2012   MCV 84.0 06/30/2012   PLT 220 06/30/2012    Recent Labs Lab 06/30/12 1110  NA 138  K 4.7  CL 103  CO2 26  BUN 22  CREATININE 1.06  CALCIUM 8.9  GLUCOSE 145*    Discharge Medications:    Medication List    STOP taking these medications       cephALEXin 500 MG capsule  Commonly known as:  KEFLEX      TAKE these medications       acetaminophen 500 MG tablet  Commonly known as:  TYLENOL  Take 500 mg by mouth daily as needed for pain.     amiodarone 200 MG tablet  Commonly known as:  PACERONE  Take 1 tablet (200 mg total) by mouth daily.  carvedilol 12.5 MG tablet  Commonly known as:  COREG  Take 12.5 mg by mouth 2 (two) times daily with a meal.     digoxin 0.25 MG tablet  Commonly known as:  LANOXIN  Take 0.5 tablets (125 mcg total) by mouth daily.     finasteride 5 MG tablet  Commonly known as:  PROSCAR  Take 5 mg by mouth 4 (four) times a week. Monday, Tuesday, Thursday, Saturday     fluticasone 50 MCG/ACT nasal spray  Commonly known as:  FLONASE  Place 2 sprays into the nose daily.     magnesium oxide 400 MG tablet  Commonly known as:  MAG-OX  Take 400 mg  by mouth daily.     multivitamin with minerals tablet  Take 1 tablet by mouth daily.     pantoprazole 40 MG tablet  Commonly known as:  PROTONIX  Take 40 mg by mouth 4 (four) times a week. Monday, Tuesday, Thursday and Saturday     pravastatin 80 MG tablet  Commonly known as:  PRAVACHOL  Take 1 tablet (80 mg total) by mouth daily.     warfarin 5 MG tablet  Commonly known as:  COUMADIN  Take 2.5-5 mg by mouth daily. 1 tablet (5 mg) on Monday, Wednesday and Friday, 1/2 tablet (2.5 mg) on all other days        Disposition:      Discharge Orders   Future Appointments Provider Department Dept Phone   07/04/2012 8:00 AM [facility] [phone]   07/14/2012 11:00 AM [facility] [employer] Main Office [place]) [phone]   07/25/2012 12:00 PM [facility] [phone]   08/03/2012 9:00 AM Laurey Morale, MD [facility] Main Office [place]) [phone]   10/07/2012 12:00 PM Marinus Maw, MD [facility] Main Office [place]) [phone]   Future Orders Complete By Expires     Diet - low sodium heart healthy  As directed     Discharge instructions  As directed     Comments:      Please see post BiV PPM implant discharge instructions    Increase activity slowly  As directed       Follow-up Information   Follow up with [facility] On 07/14/2012. (At 11:00 AM for wound check)    Contact information:   [address] [place] [address] [phone]      Follow up with [name], MD On 10/07/2012. (At 12:00 noon)    Contact information:   [address] [place] [address] [phone]      Follow up with [facility] On 07/04/2012. (At 8:00 AM for Coumadin follow-up)    Contact information:   [address] [place] [address] [phone]      Follow up with [name], MD On 08/03/2012.  (At 9:00 AM)    Contact information:   [address] [place] [address] [phone]      Duration of Discharge Encounter: Greater than 30 minutes including physician time.  Signed, Gypsy Balsam, RN, BSN 07/01/2012, 12:56 PM

## 2012-07-02 ENCOUNTER — Telehealth: Payer: Self-pay | Admitting: Physician Assistant

## 2012-07-02 NOTE — Telephone Encounter (Signed)
No answer. Left message to call back.   Jacqulyn Bath, PA-C 07/02/2012 11:19 AM

## 2012-07-02 NOTE — Telephone Encounter (Signed)
Pt called the answering svc c/o fatigue and shortness of breath since discharge. He underwent PPM revision c/b RA microperforation and redo. He denies any chest pain. He reports decreased energy and being unable to take a deep breath. He denies weight changes or swelling. He has noted a new nonproductive cough. He has a Coumadin follow-up on Monday. Discussed with Dr. Ladona Ridgel who recommended he continue to monitor these symptoms, and that he may need to recover further from this past hospital stay. If he continues to feel poorly on Monday, he may need to be quickly evaluated while in the office. Advised if his symptoms severely worsen, he may call the answering service again or present to the nearest ED. He understood and agreed.   Jacqulyn Bath, PA-C 07/02/2012 11:58 AM

## 2012-07-04 ENCOUNTER — Other Ambulatory Visit (HOSPITAL_COMMUNITY): Payer: Medicare Other

## 2012-07-04 ENCOUNTER — Ambulatory Visit (INDEPENDENT_AMBULATORY_CARE_PROVIDER_SITE_OTHER): Payer: Medicare Other

## 2012-07-04 ENCOUNTER — Encounter: Payer: Self-pay | Admitting: Internal Medicine

## 2012-07-04 ENCOUNTER — Ambulatory Visit (INDEPENDENT_AMBULATORY_CARE_PROVIDER_SITE_OTHER)
Admission: RE | Admit: 2012-07-04 | Discharge: 2012-07-04 | Disposition: A | Discharge status: Home / Self Care | Payer: Medicare Other | Source: Ambulatory Visit | Attending: Internal Medicine | Admitting: Internal Medicine

## 2012-07-04 ENCOUNTER — Ambulatory Visit (INDEPENDENT_AMBULATORY_CARE_PROVIDER_SITE_OTHER): Payer: Medicare Other | Admitting: Internal Medicine

## 2012-07-04 VITALS — BP 92/62 | HR 70 | Ht 67.0 in | Wt 169.0 lb

## 2012-07-04 DIAGNOSIS — I495 Sick sinus syndrome: Secondary | ICD-10-CM

## 2012-07-04 DIAGNOSIS — I4891 Unspecified atrial fibrillation: Secondary | ICD-10-CM | POA: Diagnosis not present

## 2012-07-04 DIAGNOSIS — R0602 Shortness of breath: Secondary | ICD-10-CM

## 2012-07-04 DIAGNOSIS — J90 Pleural effusion, not elsewhere classified: Secondary | ICD-10-CM | POA: Diagnosis not present

## 2012-07-04 DIAGNOSIS — I428 Other cardiomyopathies: Secondary | ICD-10-CM | POA: Diagnosis not present

## 2012-07-04 DIAGNOSIS — Z950 Presence of cardiac pacemaker: Secondary | ICD-10-CM

## 2012-07-04 DIAGNOSIS — J9819 Other pulmonary collapse: Secondary | ICD-10-CM | POA: Diagnosis not present

## 2012-07-04 LAB — PACEMAKER DEVICE OBSERVATION
AL IMPEDENCE PM: 380 Ohm
BATTERY VOLTAGE: 3.01 V
LV LEAD IMPEDENCE PM: 390 Ohm
RV LEAD AMPLITUDE: 12 mv

## 2012-07-04 LAB — POCT INR: INR: 2.6

## 2012-07-04 NOTE — Progress Notes (Signed)
    PCP:  [name], MD Electrophysiologist: [name], MD  The patient presents today for unscheduled follow-up.  He is s/p CRT-P implant and extraction of previously implanted temp perm pacemaker on 06-28-2012.  He had subsequent likely micro perforations of his RA and RV leads and underwent lead revision on both 4-23 and 4-24.  On the morning of 07-01-2012, he was chest pain free and his device was functioning normally so he was discharged to home.    Over the weekend, he has had fatigue and shortness of breath.  Last night, his shortness of breath acutely worsened.  He called the answering service and was advised to follow up in the office today.    He has had no further chest pain.  He also complains of a dry non productive cough but has had no fevers or chills.  His device was interrogated today and found to be functioning normally.  EKG's demonstrated a QRS duration of with CRT pacing and without CRT pacing with prolonged AV delay ( ).  His device was reprogrammed today to promote intrinsic conduction.    Limited echo was obtained today which demonstrated no pericardial effusion.   CXR demonstrated increased atelectasis at the medial right lung base.  There were small stable pleural effusions.   ROS-  All systems are reviewed and are negative except as outlined in the HPI above  Physical Exam: Filed Vitals:   07/04/12 0850  BP: 92/62  Pulse: 70  Height: 5\' 7"  (1.702 m)  Weight: 169 lb (76.658 kg)    GEN- The patient is well appearing, alert and oriented x 3 today.   Head- normocephalic, atraumatic Eyes-  Sclera clear, conjunctiva pink Ears- hearing intact Oropharynx- clear Neck- supple, no JVP Lungs- Clear to ausculation bilaterally except for decreased BS with dullness to percussion at the R base, normal work of breathing Chest- pacemaker pocket is well healed Heart- Regular rate and rhythm , no rub GI- soft, NT, ND, + BS Extremities- no  clubbing, cyanosis, trace edema MS- no significant deformity or atrophy Skin- pacemaker site is healing nicely Psych- euthymic mood, full affect Neuro- strength and sensation are intact  Pacemaker interrogation- reviewed in detail today,  See PACEART report ecgs (intrinsic and BiV pacing) reviewed today  Assessment and Plan:  1. Bradycardia Normal pacemaker function His intrinsic QRS is more narrow than with biv pacing.  I will therefore reprogram to minimize VP pacing to see if this causes improvement in SOB See Pace Art report  2. SOB Unclear etiology Limited echo today reveals no effusion.  Pulsus Paradoxus was 27mm/Hg He has a small to moderate effusion by exam.  Was very small recently.  I will repeat cxr today.  Return for scheduled follow-up with Dr Ladona Ridgel to see if he improves with above changes to programming.

## 2012-07-04 NOTE — Patient Instructions (Addendum)
Follow up with wound check as scheduled.   Have chest x-ray done today at [facility]

## 2012-07-06 ENCOUNTER — Telehealth: Payer: Self-pay | Admitting: Physician Assistant

## 2012-07-06 ENCOUNTER — Encounter (HOSPITAL_COMMUNITY): Payer: Self-pay | Admitting: *Deleted

## 2012-07-06 ENCOUNTER — Emergency Department (HOSPITAL_COMMUNITY): Payer: Medicare Other

## 2012-07-06 ENCOUNTER — Inpatient Hospital Stay (HOSPITAL_COMMUNITY)
Admission: EM | Admit: 2012-07-06 | Discharge: 2012-07-17 | DRG: 186 | Disposition: A | Discharge status: Home / Self Care | Payer: Medicare Other | Attending: Internal Medicine | Admitting: Internal Medicine

## 2012-07-06 DIAGNOSIS — Z87891 Personal history of nicotine dependence: Secondary | ICD-10-CM | POA: Diagnosis not present

## 2012-07-06 DIAGNOSIS — R0789 Other chest pain: Secondary | ICD-10-CM | POA: Diagnosis present

## 2012-07-06 DIAGNOSIS — Z7901 Long term (current) use of anticoagulants: Secondary | ICD-10-CM | POA: Diagnosis not present

## 2012-07-06 DIAGNOSIS — I4891 Unspecified atrial fibrillation: Secondary | ICD-10-CM | POA: Diagnosis present

## 2012-07-06 DIAGNOSIS — R9431 Abnormal electrocardiogram [ECG] [EKG]: Secondary | ICD-10-CM | POA: Diagnosis not present

## 2012-07-06 DIAGNOSIS — Z950 Presence of cardiac pacemaker: Secondary | ICD-10-CM | POA: Diagnosis present

## 2012-07-06 DIAGNOSIS — M353 Polymyalgia rheumatica: Secondary | ICD-10-CM | POA: Diagnosis present

## 2012-07-06 DIAGNOSIS — N400 Enlarged prostate without lower urinary tract symptoms: Secondary | ICD-10-CM | POA: Diagnosis present

## 2012-07-06 DIAGNOSIS — I428 Other cardiomyopathies: Secondary | ICD-10-CM | POA: Diagnosis not present

## 2012-07-06 DIAGNOSIS — K219 Gastro-esophageal reflux disease without esophagitis: Secondary | ICD-10-CM | POA: Diagnosis present

## 2012-07-06 DIAGNOSIS — R071 Chest pain on breathing: Secondary | ICD-10-CM | POA: Diagnosis not present

## 2012-07-06 DIAGNOSIS — E785 Hyperlipidemia, unspecified: Secondary | ICD-10-CM | POA: Diagnosis present

## 2012-07-06 DIAGNOSIS — I2510 Atherosclerotic heart disease of native coronary artery without angina pectoris: Secondary | ICD-10-CM

## 2012-07-06 DIAGNOSIS — I495 Sick sinus syndrome: Secondary | ICD-10-CM | POA: Diagnosis present

## 2012-07-06 DIAGNOSIS — T827XXA Infection and inflammatory reaction due to other cardiac and vascular devices, implants and grafts, initial encounter: Secondary | ICD-10-CM

## 2012-07-06 DIAGNOSIS — R509 Fever, unspecified: Secondary | ICD-10-CM | POA: Diagnosis not present

## 2012-07-06 DIAGNOSIS — J90 Pleural effusion, not elsewhere classified: Secondary | ICD-10-CM | POA: Diagnosis not present

## 2012-07-06 DIAGNOSIS — R0781 Pleurodynia: Secondary | ICD-10-CM | POA: Diagnosis present

## 2012-07-06 DIAGNOSIS — I10 Essential (primary) hypertension: Secondary | ICD-10-CM | POA: Diagnosis present

## 2012-07-06 DIAGNOSIS — Z79899 Other long term (current) drug therapy: Secondary | ICD-10-CM | POA: Diagnosis not present

## 2012-07-06 DIAGNOSIS — R7401 Elevation of levels of liver transaminase levels: Secondary | ICD-10-CM | POA: Diagnosis present

## 2012-07-06 DIAGNOSIS — J189 Pneumonia, unspecified organism: Secondary | ICD-10-CM | POA: Diagnosis not present

## 2012-07-06 HISTORY — DX: Infection and inflammatory reaction due to other cardiac and vascular devices, implants and grafts, initial encounter: T82.7XXA

## 2012-07-06 LAB — CBC WITH DIFFERENTIAL/PLATELET
Basophils Absolute: 0 10*3/uL (ref 0.0–0.1)
Basophils Relative: 0 % (ref 0–1)
Eosinophils Absolute: 0.3 10*3/uL (ref 0.0–0.7)
Eosinophils Relative: 2 % (ref 0–5)
HCT: 36.6 % — ABNORMAL LOW (ref 39.0–52.0)
MCH: 29.3 pg (ref 26.0–34.0)
MCHC: 35 g/dL (ref 30.0–36.0)
MCV: 83.8 fL (ref 78.0–100.0)
Monocytes Absolute: 1.9 10*3/uL — ABNORMAL HIGH (ref 0.1–1.0)
RDW: 14.5 % (ref 11.5–15.5)

## 2012-07-06 LAB — COMPREHENSIVE METABOLIC PANEL
AST: 62 U/L — ABNORMAL HIGH (ref 0–37)
Albumin: 3 g/dL — ABNORMAL LOW (ref 3.5–5.2)
CO2: 25 mEq/L (ref 19–32)
Calcium: 9.3 mg/dL (ref 8.4–10.5)
Creatinine, Ser: 0.98 mg/dL (ref 0.50–1.35)
GFR calc non Af Amer: 75 mL/min — ABNORMAL LOW (ref >90)
Total Protein: 7.1 g/dL (ref 6.0–8.3)

## 2012-07-06 LAB — PROTIME-INR
INR: 2.71 — ABNORMAL HIGH (ref 0.00–1.49)
Prothrombin Time: 27.4 seconds — ABNORMAL HIGH (ref 11.6–15.2)

## 2012-07-06 LAB — POCT I-STAT TROPONIN I

## 2012-07-06 MED ORDER — FINASTERIDE 5 MG PO TABS
5.0000 mg | ORAL_TABLET | ORAL | Status: DC
  Administered 2012-07-07 – 2012-07-16 (×6): 5 mg via ORAL
  Filled 2012-07-06 (×9): qty 1

## 2012-07-06 MED ORDER — WARFARIN SODIUM 2.5 MG PO TABS: 2.5000 mg | ORAL_TABLET | Freq: Every day | ORAL | Status: DC

## 2012-07-06 MED ORDER — WARFARIN - PHARMACIST DOSING INPATIENT
Freq: Every day | Status: DC
  Administered 2012-07-07 – 2012-07-11 (×2)

## 2012-07-06 MED ORDER — MAGNESIUM OXIDE 400 MG PO TABS
400.0000 mg | ORAL_TABLET | Freq: Every day | ORAL | Status: DC
  Administered 2012-07-07 – 2012-07-16 (×10): 400 mg via ORAL
  Filled 2012-07-06 (×12): qty 1

## 2012-07-06 MED ORDER — FENTANYL CITRATE 0.05 MG/ML IJ SOLN
INTRAMUSCULAR | Status: AC
  Filled 2012-07-06: qty 4

## 2012-07-06 MED ORDER — DEXTROSE 5 % IV SOLN
500.0000 mg | Freq: Once | INTRAVENOUS | Status: AC
  Administered 2012-07-06: 500 mg via INTRAVENOUS
  Filled 2012-07-06: qty 500

## 2012-07-06 MED ORDER — DIGOXIN 125 MCG PO TABS
125.0000 ug | ORAL_TABLET | Freq: Every day | ORAL | Status: DC
  Administered 2012-07-07 – 2012-07-16 (×10): 125 ug via ORAL
  Filled 2012-07-06 (×12): qty 1

## 2012-07-06 MED ORDER — FLUTICASONE PROPIONATE 50 MCG/ACT NA SUSP
2.0000 | Freq: Every day | NASAL | Status: DC
  Administered 2012-07-07 – 2012-07-15 (×7): 2 via NASAL
  Filled 2012-07-06 (×3): qty 16

## 2012-07-06 MED ORDER — SIMVASTATIN 40 MG PO TABS
40.0000 mg | ORAL_TABLET | Freq: Every day | ORAL | Status: DC
  Administered 2012-07-06 – 2012-07-07 (×2): 40 mg via ORAL
  Filled 2012-07-06 (×3): qty 1

## 2012-07-06 MED ORDER — DEXTROSE 5 % IV SOLN
1.0000 g | Freq: Once | INTRAVENOUS | Status: AC
  Administered 2012-07-06: 1 g via INTRAVENOUS
  Filled 2012-07-06: qty 10

## 2012-07-06 MED ORDER — ONDANSETRON HCL 4 MG PO TABS: 4.0000 mg | ORAL_TABLET | Freq: Four times a day (QID) | ORAL | Status: DC | PRN

## 2012-07-06 MED ORDER — WARFARIN SODIUM 5 MG PO TABS
5.0000 mg | ORAL_TABLET | Freq: Once | ORAL | Status: AC
  Administered 2012-07-06: 5 mg via ORAL
  Filled 2012-07-06: qty 1

## 2012-07-06 MED ORDER — CARVEDILOL 12.5 MG PO TABS
12.5000 mg | ORAL_TABLET | Freq: Two times a day (BID) | ORAL | Status: DC
  Administered 2012-07-06 – 2012-07-17 (×21): 12.5 mg via ORAL
  Filled 2012-07-06 (×25): qty 1

## 2012-07-06 MED ORDER — DEXTROSE 5 % IV SOLN
1.0000 g | Freq: Two times a day (BID) | INTRAVENOUS | Status: DC
  Administered 2012-07-06 – 2012-07-07 (×3): 1 g via INTRAVENOUS
  Filled 2012-07-06 (×5): qty 1

## 2012-07-06 MED ORDER — VANCOMYCIN HCL IN DEXTROSE 750-5 MG/150ML-% IV SOLN
750.0000 mg | Freq: Two times a day (BID) | INTRAVENOUS | Status: DC
  Administered 2012-07-06 – 2012-07-09 (×6): 750 mg via INTRAVENOUS
  Filled 2012-07-06 (×8): qty 150

## 2012-07-06 MED ORDER — MIDAZOLAM HCL 2 MG/2ML IJ SOLN
INTRAMUSCULAR | Status: AC
  Filled 2012-07-06: qty 6

## 2012-07-06 MED ORDER — LEVALBUTEROL HCL 0.63 MG/3ML IN NEBU
0.6300 mg | INHALATION_SOLUTION | RESPIRATORY_TRACT | Status: DC | PRN
  Administered 2012-07-08 – 2012-07-11 (×8): 0.63 mg via RESPIRATORY_TRACT
  Filled 2012-07-06 (×8): qty 3

## 2012-07-06 MED ORDER — ACETAMINOPHEN 325 MG PO TABS
650.0000 mg | ORAL_TABLET | Freq: Four times a day (QID) | ORAL | Status: DC | PRN
  Administered 2012-07-06 – 2012-07-14 (×20): 650 mg via ORAL
  Filled 2012-07-06 (×20): qty 2

## 2012-07-06 MED ORDER — SODIUM CHLORIDE 0.9 % IJ SOLN
3.0000 mL | Freq: Two times a day (BID) | INTRAMUSCULAR | Status: DC
  Administered 2012-07-07 – 2012-07-15 (×14): 3 mL via INTRAVENOUS

## 2012-07-06 MED ORDER — DOCUSATE SODIUM 100 MG PO CAPS
100.0000 mg | ORAL_CAPSULE | Freq: Two times a day (BID) | ORAL | Status: DC
  Administered 2012-07-06 – 2012-07-14 (×16): 100 mg via ORAL
  Filled 2012-07-06 (×25): qty 1

## 2012-07-06 MED ORDER — HYDROCODONE-ACETAMINOPHEN 5-325 MG PO TABS
1.0000 | ORAL_TABLET | ORAL | Status: DC | PRN
  Administered 2012-07-06: 1 via ORAL
  Filled 2012-07-06 (×2): qty 1

## 2012-07-06 MED ORDER — AMIODARONE HCL 200 MG PO TABS
200.0000 mg | ORAL_TABLET | Freq: Every day | ORAL | Status: DC
  Administered 2012-07-07 – 2012-07-16 (×10): 200 mg via ORAL
  Filled 2012-07-06 (×12): qty 1

## 2012-07-06 MED ORDER — BENZONATATE 100 MG PO CAPS
100.0000 mg | ORAL_CAPSULE | Freq: Three times a day (TID) | ORAL | Status: DC
  Administered 2012-07-06 – 2012-07-16 (×30): 100 mg via ORAL
  Filled 2012-07-06 (×36): qty 1

## 2012-07-06 MED ORDER — ONDANSETRON HCL 4 MG/2ML IJ SOLN: 4.0000 mg | Freq: Four times a day (QID) | INTRAMUSCULAR | Status: DC | PRN

## 2012-07-06 MED ORDER — PANTOPRAZOLE SODIUM 40 MG PO TBEC
40.0000 mg | DELAYED_RELEASE_TABLET | ORAL | Status: DC
  Administered 2012-07-07 – 2012-07-16 (×6): 40 mg via ORAL
  Filled 2012-07-06 (×6): qty 1

## 2012-07-06 MED ORDER — ALUM & MAG HYDROXIDE-SIMETH 200-200-20 MG/5ML PO SUSP: 30.0000 mL | Freq: Four times a day (QID) | ORAL | Status: DC | PRN

## 2012-07-06 MED ORDER — MORPHINE SULFATE 2 MG/ML IJ SOLN
2.0000 mg | INTRAMUSCULAR | Status: DC | PRN
  Administered 2012-07-13: 4 mg via INTRAVENOUS
  Filled 2012-07-06: qty 2
  Filled 2012-07-06: qty 1

## 2012-07-06 NOTE — ED Provider Notes (Addendum)
History     CSN: [id number]  Arrival date & time 07/06/12  5784   First MD Initiated Contact with Patient 07/06/12 [phone]      Chief Complaint  Patient presents with  . Chest Pain  . Pacemaker Problem    (Consider location/radiation/quality/duration/timing/severity/associated sxs/prior treatment) Patient is a 79 y.o. male presenting with chest pain. The history is provided by the patient and the spouse.  Chest Pain Pain location:  R chest Pain quality: sharp and tightness   Pain radiates to:  Does not radiate Pain radiates to the back: no   Pain severity:  Moderate Onset quality:  Gradual Duration:  24 hours Timing:  Constant Progression:  Unchanged Chronicity:  Recurrent Context comment:  States last night developed fever of 101.7 and recurrent right sided chest pain that he had when his leads needed to be revised Relieved by:  Nothing Worsened by:  Coughing and deep breathing Associated symptoms: anorexia, cough, fever and shortness of breath   Associated symptoms: no abdominal pain, no orthopnea, no palpitations and not vomiting   Risk factors: coronary artery disease, high cholesterol, hypertension and surgery   Risk factors comment:  Recent pacemaker revision   Past Medical History  Diagnosis Date  . Coronary artery disease   . Cardiomyopathy primary-nonischemic EF 45%  . Hyperlipidemia   . GERD (gastroesophageal reflux disease)   . Polymyalgia rheumatica   . Sick sinus syndrome   . Atrial fibrillation   . Dysphagia   . Hearing loss, mixed, bilateral   . Increased prostate specific antigen (PSA) velocity   . Lumbar back pain   . BPH (benign prostatic hypertrophy)   . Breast mass     "on both sides" (06/28/2012)  . CHF (congestive heart failure)   . Elevated liver enzymes   . Esophageal stricture   . Pacemaker   . Arthritis     "some" (06/28/2012)    Past Surgical History  Procedure Laterality Date  . Cataract extraction w/ intraocular lens  implant,  bilateral Bilateral   . Umbilical hernia repair    . Insert / replace / remove pacemaker    . Inguinal hernia repair Right 2012    RIH  . Umbilical hernia repair    . Cardioversion  06/17/2011    Procedure: CARDIOVERSION;  Surgeon: [name], MD;  Location: [facility] OR;  Service: Cardiovascular;  Laterality: N/A;  . Cardioversion  09/09/2011    Procedure: CARDIOVERSION;  Surgeon: Luis Abed, MD;  Location: [facility] OR;  Service: Cardiovascular;  Laterality: N/A;  . Cardioversion  01/01/2012    Procedure: CARDIOVERSION;  Surgeon: Herby Abraham, MD;  Location: [facility] ENDOSCOPY;  Service: Cardiovascular;  Laterality: N/A;  . Tee without cardioversion  01/01/2012    Procedure: TRANSESOPHAGEAL ECHOCARDIOGRAM (TEE);  Surgeon: Pricilla Riffle, MD;  Location: [facility] ENDOSCOPY;  Service: Cardiovascular;  Laterality: N/A;  . Eye surgery    . Generator removal Left 06/15/2012    Procedure: GENERATOR REMOVAL;  Surgeon: Marinus Maw, MD;  Location: [facility] OR;  Service: Cardiovascular;  Laterality: Left;  . Pacemaker generator change N/A 06/15/2012    Procedure: PACEMAKER GENERATOR CHANGE;  Surgeon: Marinus Maw, MD;  Location: [facility] OR;  Service: Cardiovascular;  Laterality: N/A;  . Tonsillectomy and adenoidectomy    . Cardiac catheterization      Family History  Problem Relation Age of Onset  . Heart attack Father 64    deceased  . Aneurysm Mother     deceased AAA  .  Hypertension Mother   . Other Brother     deceased at birth    History  Substance Use Topics  . Smoking status: Former Smoker -- 1.00 packs/day for 40 years    Types: Cigarettes    Quit date: 03/10/1983  . Smokeless tobacco: Never Used  . Alcohol Use: No      Review of Systems  Constitutional: Positive for fever.  Respiratory: Positive for cough and shortness of breath.   Cardiovascular: Positive for chest pain. Negative for palpitations and orthopnea.  Gastrointestinal: Positive for anorexia. Negative for vomiting and abdominal pain.   All other systems reviewed and are negative.    Allergies  Antihistamines, diphenhydramine-type  Home Medications   Current Outpatient Rx  Name  Route  Sig  Dispense  Refill  . acetaminophen (TYLENOL) 500 MG tablet   Oral   Take 500 mg by mouth daily as needed for pain.         Marland Kitchen amiodarone (PACERONE) 200 MG tablet   Oral   Take 1 tablet (200 mg total) by mouth daily.   90 tablet   3   . carvedilol (COREG) 12.5 MG tablet   Oral   Take 12.5 mg by mouth 2 (two) times daily with a meal.         . digoxin (LANOXIN) 0.25 MG tablet   Oral   Take 0.5 tablets (125 mcg total) by mouth daily.   90 tablet   3   . finasteride (PROSCAR) 5 MG tablet   Oral   Take 5 mg by mouth 4 (four) times a week. Monday, Tuesday, Thursday, Saturday         . fluticasone (FLONASE) 50 MCG/ACT nasal spray   Nasal   Place 2 sprays into the nose daily.   16 g   3   . magnesium oxide (MAG-OX) 400 MG tablet   Oral   Take 400 mg by mouth daily.         . Multiple Vitamins-Minerals (MULTIVITAMIN WITH MINERALS) tablet   Oral   Take 1 tablet by mouth daily.           . pantoprazole (PROTONIX) 40 MG tablet   Oral   Take 40 mg by mouth 4 (four) times a week. Monday, Tuesday, Thursday and Saturday         . pravastatin (PRAVACHOL) 80 MG tablet   Oral   Take 1 tablet (80 mg total) by mouth daily.   90 tablet   3   . warfarin (COUMADIN) 5 MG tablet   Oral   Take 2.5-5 mg by mouth daily. 1 tablet (5 mg) on Monday, Wednesday and Friday, 1/2 tablet (2.5 mg) on all other days           There were no vitals taken for this visit.  Physical Exam  Nursing note and vitals reviewed. Constitutional: He is oriented to person, place, and time. He appears well-developed and well-nourished. No distress.  HENT:  Head: Normocephalic and atraumatic.  Mouth/Throat: Oropharynx is clear and moist.  Eyes: Conjunctivae and EOM are normal. Pupils are equal, round, and reactive to light.  Neck:  Normal range of motion. Neck supple.  Cardiovascular: Normal rate, regular rhythm and intact distal pulses.   No murmur heard. Pulmonary/Chest: Tachypnea noted. No respiratory distress. He has decreased breath sounds in the right lower field. He has no wheezes. He has no rales.  Pacemaker in left chest wall without hematoma or drainage from incision  Abdominal:  Soft. He exhibits no distension. There is no tenderness. There is no rebound and no guarding.  Musculoskeletal: Normal range of motion. He exhibits no edema and no tenderness.  Neurological: He is alert and oriented to person, place, and time.  Skin: Skin is warm and dry. No rash noted. No erythema.  Psychiatric: He has a normal mood and affect. His behavior is normal.    ED Course  Procedures (including critical care time)  Labs Reviewed  CBC WITH DIFFERENTIAL - Abnormal; Notable for the following:    WBC 13.0 (*)    Hemoglobin 12.8 (*)    HCT 36.6 (*)    Neutro Abs 9.0 (*)    Monocytes Relative 15 (*)    Monocytes Absolute 1.9 (*)    All other components within normal limits  COMPREHENSIVE METABOLIC PANEL  POCT I-STAT TROPONIN I   Dg Chest 2 View  07/06/2012  *RADIOLOGY REPORT*  Clinical Data: Right-sided chest pain. Shortness of breath. Fever.  CHEST - 2 VIEW  Comparison: 07/04/2012.  Findings: Biventricular pacer in place with leads unchanged in position.  Increase in size of right-sided pleural effusion.  Right base atelectasis suspected.  Limited for detecting the right base infiltrate or mass.  Central pulmonary vascular prominence.  Limited evaluation of cardiac contour secondary to the right base pleural effusion.  Slightly tortuous calcified aorta.  IMPRESSION: Increase in size of right-sided pleural effusion.  Please see above.   Original Report Authenticated By: Lacy Duverney, M.D.    Dg Chest 2 View  07/04/2012  *RADIOLOGY REPORT*  Clinical Data: Cough and shortness of breath 1 day following pacemaker insertion on  06/30/2012.  Ex-smoker.  CHEST - 2 VIEW  Comparison: 07/01/2012 at [facility].  Findings: Normal sized heart.  Increased atelectasis at the medial right lung base.  No significant change in small bilateral pleural effusions.  Normal vascularity.  Mild thoracic spine degenerative changes.  Stable left subclavian pacemaker leads.  IMPRESSION:  1.  Increased atelectasis at the medial right lung base. 2.  Stable small bilateral pleural effusions.   Original Report Authenticated By: Beckie Salts, M.D.      Date: 07/06/2012  Rate: 74  Rhythm: Atrial paced rhythm  QRS Axis: indeterminate  Intervals: normal  ST/T Wave abnormalities: nonspecific ST/T changes  Conduction Disutrbances:nonspecific intraventricular conduction delay  Narrative Interpretation:   Old EKG Reviewed: unchanged   1. Pleural effusion on right       MDM  Patient with recent pacemaker revision and left the hospital several days ago who returns today for worsening shortness of breath over the last 24 hours, fever of 101.7 and right-sided chest pain. He states he felt this right-sided chest pain when they had to revise the leads 2 to poor placement. He appears short of breath on exam but is satting 94-95%. He has decreased lung sounds in the right lower lobe but otherwise pacemaker site is well appearing without signs of infection. He has no abdominal pain and no signs of CHF.  Patient has pacemaker due to sick sinus syndrome.  Concern for new infectious etiology after recent procedures versus pleural effusion.  Chest x-ray from 4/28 showed increased atelectasis of the medial right lung base and concern now for new pneumonia.  Also at that time he had small bilateral pleural effusions.  Chest x-ray, CBC, CMP, troponin, INR pending. EKG unchanged with atrial paced rhythm.  8:44 AM Patient with new right-sided pleural effusion with increased atelectasis. White blood cell count of 13,000  however it was 26,000 after surgery.  Unclear if patient's symptoms are all due to atelectasis and effusion or if there is a new infection starting. We'll discuss with admitting team if they want to start antibiotics.  8:53 AM Azithro and rocephin started       Gwyneth Sprout, MD 07/06/12 1610  Gwyneth Sprout, MD 07/06/12 [phone]

## 2012-07-06 NOTE — H&P (Signed)
[facility] History and Physical  [name] XBJ:[id number] DOB: 29-May-1929 DOA: 07/06/2012  Referring physician: ED physician Dr. Anitra Lauth PCP: [name], MD  Specialists: Cardiologist/electrophysiologist Sharrell Ku, M.D.  Chief Complaint: Pleuritic chest pain.  HPI: [name] is a 79 y.o. male with a recent history of recent hospitalization from 06/28/2012 through 07/01/2012 for pacemaker system infection. This was complicated by a presumed microperforation of his RA and RV leads requiring lead revision on 06/29/2012 and 06/30/2012. He was treated with antibiotics. He is status post CRT-P implantation. He was evaluated by Dr. Johney Frame in his office on 07/04/2012. His device was interrogated today and found to be functioning normally. He presents with a two-day history of right-sided pleuritic chest pain, shortness of breath with rest and with activity but worse with activity, a dry cough, a fever of 101.7 at home, but no chills. He also has some residual left upper chest wall pain from the pacemaker extraction and implantation. He also feels weak overall. He denies associated headache, dizziness, nausea, vomiting, diarrhea, abdominal pain, or pain with urination.  In the emergency department, he was borderline febrile with a temperature of 99.7 and hemodynamically stable. He was oxygenating 93-96% on nasal cannula oxygen. His point-of-care cardiac marker was within normal limits. His white blood cell count was elevated at 13.0. His hemoglobin was 12.8. His chest x-ray revealed an increase in the size of right-sided pleural effusion and right base infiltrate versus atelectasis. He is being admitted for further evaluation and management.  Review of Systems: Positive as above in history present illness. Otherwise, review of systems is negative.    Past Medical History  Diagnosis Date  . Coronary artery disease   . Cardiomyopathy primary-nonischemic EF 45%  . Hyperlipidemia    . GERD (gastroesophageal reflux disease)   . Polymyalgia rheumatica   . Sick sinus syndrome   . Atrial fibrillation   . Dysphagia   . Hearing loss, mixed, bilateral   . Increased prostate specific antigen (PSA) velocity   . Lumbar back pain   . BPH (benign prostatic hypertrophy)   . Breast mass     "on both sides" (06/28/2012)  . CHF (congestive heart failure)   . Elevated liver enzymes   . Esophageal stricture   . Pacemaker   . Arthritis     "some" (06/28/2012)  . Pacemaker infection     06/28/12   Past Surgical History  Procedure Laterality Date  . Cataract extraction w/ intraocular lens  implant, bilateral Bilateral   . Umbilical hernia repair    . Insert / replace / remove pacemaker    . Inguinal hernia repair Right 2012    RIH  . Umbilical hernia repair    . Cardioversion  06/17/2011    Procedure: CARDIOVERSION;  Surgeon: [name], MD;  Location: [facility] OR;  Service: Cardiovascular;  Laterality: N/A;  . Cardioversion  09/09/2011    Procedure: CARDIOVERSION;  Surgeon: Luis Abed, MD;  Location: [facility] OR;  Service: Cardiovascular;  Laterality: N/A;  . Cardioversion  01/01/2012    Procedure: CARDIOVERSION;  Surgeon: Herby Abraham, MD;  Location: [facility] ENDOSCOPY;  Service: Cardiovascular;  Laterality: N/A;  . Tee without cardioversion  01/01/2012    Procedure: TRANSESOPHAGEAL ECHOCARDIOGRAM (TEE);  Surgeon: Pricilla Riffle, MD;  Location: [facility] ENDOSCOPY;  Service: Cardiovascular;  Laterality: N/A;  . Eye surgery    . Generator removal Left 06/15/2012    Procedure: GENERATOR REMOVAL;  Surgeon: Marinus Maw, MD;  Location:  [facility] OR;  Service: Cardiovascular;  Laterality: Left;  . Pacemaker generator change N/A 06/15/2012    Procedure: PACEMAKER GENERATOR CHANGE;  Surgeon: Marinus Maw, MD;  Location: [facility] OR;  Service: Cardiovascular;  Laterality: N/A;  . Tonsillectomy and adenoidectomy    . Cardiac catheterization     Social History: The patient is married. He has 3 children. He  is a retired Psychologist, sport and exercise. He quit smoking 29 years ago after a 40-pack-year history. He denies alcohol and illicit drug use.  Allergies  Allergen Reactions  . Antihistamines, Diphenhydramine-Type Other (See Comments)    Causes difficulty in ability to urinate.    Family History  Problem Relation Age of Onset  . Heart attack Father 84    deceased  . Aneurysm Mother     deceased AAA  . Hypertension Mother   . Other Brother     deceased at birth   Prior to Admission medications   Medication Sig Start Date End Date Taking? Authorizing Provider  acetaminophen (TYLENOL) 500 MG tablet Take 500 mg by mouth daily as needed for pain.   Yes Historical Provider, MD  amiodarone (PACERONE) 200 MG tablet Take 1 tablet (200 mg total) by mouth daily. 03/10/12  Yes Lindley Magnus, MD  carvedilol (COREG) 12.5 MG tablet Take 12.5 mg by mouth 2 (two) times daily with a meal.   Yes Historical Provider, MD  digoxin (LANOXIN) 0.25 MG tablet Take 0.5 tablets (125 mcg total) by mouth daily. 03/10/12  Yes Lindley Magnus, MD  finasteride (PROSCAR) 5 MG tablet Take 5 mg by mouth 4 (four) times a week. Monday, Tuesday, Thursday, Saturday 04/30/11  Yes Historical Provider, MD  fluticasone (FLONASE) 50 MCG/ACT nasal spray Place 2 sprays into the nose daily. 06/01/12  Yes Lindley Magnus, MD  magnesium oxide (MAG-OX) 400 MG tablet Take 400 mg by mouth daily.   Yes Historical Provider, MD  Multiple Vitamins-Minerals (MULTIVITAMIN WITH MINERALS) tablet Take 1 tablet by mouth daily.     Yes Historical Provider, MD  pantoprazole (PROTONIX) 40 MG tablet Take 40 mg by mouth 4 (four) times a week. Monday, Tuesday, Thursday and Saturday   Yes Historical Provider, MD  pravastatin (PRAVACHOL) 80 MG tablet Take 1 tablet (80 mg total) by mouth daily. 03/10/12  Yes Lindley Magnus, MD  warfarin (COUMADIN) 5 MG tablet Take 2.5-5 mg by mouth daily. 1 tablet (5 mg) on Monday, Wednesday and Friday, 1/2 tablet (2.5 mg) on all other days   Yes  Historical Provider, MD   Physical Exam: Filed Vitals:   07/06/12 1200 07/06/12 1251 07/06/12 1252 07/06/12 1327  BP: 121/36 137/71 137/71   Pulse: 70 66 66   Temp:  99 F (37.2 C) 99.7 F (37.6 C)   TempSrc:   Oral   Resp: 21 20 20    Height:  5\' 7"  (1.702 m)  5\' 7"  (1.702 m)  Weight:  73.6 kg (162 lb 4.1 oz)  73.6 kg (162 lb 4.1 oz)  SpO2: 95% 99% 99%      General:  Pleasant alert ill-appearing 79 year old Caucasian man laying in bed, in no acute distress.  Eyes: Pupils are equal, round, and reactive to light. Extraocular was are intact. Conjunctivae are clear. Sclerae are white.  ENT: Oropharynx reveals moist mucous membranes. No posterior exudates or erythema.  Neck: Supple, no adenopathy, no thyromegaly, no JVD.  Cardiovascular: Irregular, irregular versus S1, S2, with an occasional ectopic beat. Chest wall with palpable pacemaker left upper chest  wall. Mildly tender.  Respiratory: Faint crackles, but with decreased breath sounds on the right. Breathing mildly labored when speaking but not labored at rest.  Abdomen: Mildly obese, positive bowel sounds, soft, nontender, nondistended.  Skin: Good turgor. No rashes. Healing scars over the chest wall.  Musculoskeletal: No acute hot red joints. Pedal pulses palpable bilaterally. No pedal edema.  Psychiatric: He is alert and oriented x3. His speech is clear. His affect is pleasant.  Neurologic: Cranial nerves II through XII are intact.  Labs on Admission:  Basic Metabolic Panel:  Recent Labs Lab 06/30/12 1110 07/06/12 0750  NA 138 134*  K 4.7 4.4  CL 103 99  CO2 26 25  GLUCOSE 145* 118*  BUN 22 15  CREATININE 1.06 0.98  CALCIUM 8.9 9.3   Liver Function Tests:  Recent Labs Lab 07/06/12 0750  AST 62*  ALT 61*  ALKPHOS 72  BILITOT 1.0  PROT 7.1  ALBUMIN 3.0*   No results found for this basename: LIPASE, AMYLASE,  in the last 168 hours No results found for this basename: AMMONIA,  in the last 168  hours CBC:  Recent Labs Lab 06/30/12 1110 07/06/12 0750  WBC 26.1* 13.0*  NEUTROABS  --  9.0*  HGB 12.7* 12.8*  HCT 35.1* 36.6*  MCV 84.0 83.8  PLT 220 372   Cardiac Enzymes: No results found for this basename: CKTOTAL, CKMB, CKMBINDEX, TROPONINI,  in the last 168 hours  BNP (last 3 results) No results found for this basename: PROBNP,  in the last 8760 hours CBG: No results found for this basename: GLUCAP,  in the last 168 hours  Radiological Exams on Admission: Dg Chest 2 View  07/06/2012  *RADIOLOGY REPORT*  Clinical Data: Right-sided chest pain. Shortness of breath. Fever.  CHEST - 2 VIEW  Comparison: 07/04/2012.  Findings: Biventricular pacer in place with leads unchanged in position.  Increase in size of right-sided pleural effusion.  Right base atelectasis suspected.  Limited for detecting the right base infiltrate or mass.  Central pulmonary vascular prominence.  Limited evaluation of cardiac contour secondary to the right base pleural effusion.  Slightly tortuous calcified aorta.  IMPRESSION: Increase in size of right-sided pleural effusion.  Please see above.   Original Report Authenticated By: Lacy Duverney, M.D.     EKG: Independently reviewed.  Assessment/Plan Principal Problem:   Pleuritic chest pain Active Problems:   HCAP (healthcare-associated pneumonia)   Pacemaker infection   Pleural effusion on right   Atrial fibrillation   SICK SINUS SYNDROME   Nonischemic cardiomyopathy   Transaminitis   1. This is an 79 year old man with a recent hospitalization for pacemaker infection, removal of prior permanent pacemaker and implantation of another pacemaker. He presents with fever at home, cough, pleuritic chest pain, right lower lobe infiltrate versus atelectasis and a pleural effusion. Clinically and symptomatically, he appears to have pneumonia. Because of his recent hospitalization, he is being treated for presumed healthcare associated pneumonia. The effusion  could be parapneumonic. His mild hepatic transaminitis could be secondary to pneumonia and/or the pleural effusion on the right. This will be followed. He is afebrile and hemodynamically stable. He is anticoagulated on Coumadin for his chronic atrial fibrillation. Cardiology has evaluated the patient and recommends treatment of pneumonia.     Plan: 1. The patient was given Rocephin and azithromycin in the emergency department. Will change antibiotics to vancomycin and cefepime for treatment of healthcare associated pneumonia. We'll add as needed Xopenex nebulization. Will give Jerilynn Som for  cough. 2. Oxygen therapy titrated to keep his oxygen saturations above 92% or for comfort. 3. Continue most of not all of his chronic cardiac medications. 4. Blood cultures x2 were ordered in the emergency department. We'll follow. We'll check the urine Legionella antigen and strep pneumo antigen. 5. We'll order an ultrasound of his chest to assess the extent of the pleural effusion. If it is enough to be tapped, we'll consult interventional radiology. 6. Reconsult cardiology as needed.    Code Status: Full code Family Communication: Family not available. Disposition Plan: Plan discharge to home when clinically improved.  Time spent: Greater than one hour.  [facility] Pager [phone]  If 7PM-7AM, please contact night-coverage [email or web address] Password [facility] 07/06/2012, 6:11 PM

## 2012-07-06 NOTE — ED Notes (Signed)
Pt states he had new pacemaker inserted last week and has had trouble with the wires staying in place. Pt short of breath and having pain to right side of chest since last night. Pt had wires replaced x 2 in last week. Pt states just generally weak with no energy.

## 2012-07-06 NOTE — ED Notes (Signed)
Patient transported to X-ray 

## 2012-07-06 NOTE — Telephone Encounter (Signed)
Patient called answering service regarding development of fever. Had pacer placed last week. Post-procedure he developed chest pain and had likely subsequent micro perforations of his RA and RV leads. He underwent lead revision on both 4-23 and 4-24 with improvement in symptoms. He saw Dr. Johney Frame 4/28 in the office for SOB and device was reprogrammed. He felt well after this change until last night when he developed fever to 101.7, recurrent CP and SOB. BP stable at home 140s-160s systolic. Patient instructed to proceed to ER for further evaluation. EP team aware. He knows not to drive himself. Dayna Dunn PA-C

## 2012-07-06 NOTE — Progress Notes (Addendum)
ANTIBIOTIC CONSULT NOTE - INITIAL  Pharmacy Consult for Vancomycin Indication: pneumonia  ANTICOAGULATION CONSULT NOTE - Initial Consult  Pharmacy Consult for Coumadin Indication:  H/o Atrial fibrillation  Allergies  Allergen Reactions  . Antihistamines, Diphenhydramine-Type Other (See Comments)    Causes difficulty in ability to urinate.    Patient Measurements: Height: 5\' 7"  (170.2 cm) Weight: 162 lb 4.1 oz (73.6 kg) IBW/kg (Calculated) : 66.1   Vital Signs: Temp: 99.7 F (37.6 C) (04/30 1252) Temp src: Oral (04/30 1252) BP: 137/71 mmHg (04/30 1252) Pulse Rate: 66 (04/30 1252) Intake/Output from previous day:   Intake/Output from this shift:    Labs:  Recent Labs  07/04/12 0807 07/06/12 0750  HGB  --  12.8*  HCT  --  36.6*  PLT  --  372  INR 2.6  --   CREATININE  --  0.98    Recent Labs  07/06/12 0750  WBC 13.0*  HGB 12.8*  PLT 372  CREATININE 0.98   Estimated Creatinine Clearance: 55.3 ml/min (by C-G formula based on Cr of 0.98). No results found for this basename: VANCOTROUGH, Leodis Binet, VANCORANDOM, GENTTROUGH, GENTPEAK, GENTRANDOM, TOBRATROUGH, TOBRAPEAK, TOBRARND, AMIKACINPEAK, AMIKACINTROU, AMIKACIN,  in the last 72 hours   Microbiology: Recent Results (from the past 720 hour(s))  SURGICAL PCR SCREEN     Status: None   Collection Time    06/15/12  9:13 AM      Result Value Range Status   MRSA, PCR NEGATIVE  NEGATIVE Final   Staphylococcus aureus NEGATIVE  NEGATIVE Final   Comment:            The Xpert SA Assay (FDA     approved for NASAL specimens     in patients over 2 years of age),     is one component of     a comprehensive surveillance     program.  Test performance has     been validated by [employer] for patients greater     than or equal to 51 year old.     It is not intended     to diagnose infection nor to     guide or monitor treatment.  MRSA PCR SCREENING     Status: None   Collection Time    06/15/12  8:18 PM       Result Value Range Status   MRSA by PCR NEGATIVE  NEGATIVE Final   Comment:            The GeneXpert MRSA Assay (FDA     approved for NASAL specimens     only), is one component of a     comprehensive MRSA colonization     surveillance program. It is not     intended to diagnose MRSA     infection nor to guide or     monitor treatment for     MRSA infections.    Medical History: Past Medical History  Diagnosis Date  . Coronary artery disease   . Cardiomyopathy primary-nonischemic EF 45%  . Hyperlipidemia   . GERD (gastroesophageal reflux disease)   . Polymyalgia rheumatica   . Sick sinus syndrome   . Atrial fibrillation   . Dysphagia   . Hearing loss, mixed, bilateral   . Increased prostate specific antigen (PSA) velocity   . Lumbar back pain   . BPH (benign prostatic hypertrophy)   . Breast mass     "on both sides" (06/28/2012)  . CHF (congestive  heart failure)   . Elevated liver enzymes   . Esophageal stricture   . Pacemaker   . Arthritis     "some" (06/28/2012)  . Pacemaker infection     06/28/12    Medications:  Prescriptions prior to admission  Medication Sig Dispense Refill  . acetaminophen (TYLENOL) 500 MG tablet Take 500 mg by mouth daily as needed for pain.      Marland Kitchen amiodarone (PACERONE) 200 MG tablet Take 1 tablet (200 mg total) by mouth daily.  90 tablet  3  . carvedilol (COREG) 12.5 MG tablet Take 12.5 mg by mouth 2 (two) times daily with a meal.      . digoxin (LANOXIN) 0.25 MG tablet Take 0.5 tablets (125 mcg total) by mouth daily.  90 tablet  3  . finasteride (PROSCAR) 5 MG tablet Take 5 mg by mouth 4 (four) times a week. Monday, Tuesday, Thursday, Saturday      . fluticasone (FLONASE) 50 MCG/ACT nasal spray Place 2 sprays into the nose daily.  16 g  3  . magnesium oxide (MAG-OX) 400 MG tablet Take 400 mg by mouth daily.      . Multiple Vitamins-Minerals (MULTIVITAMIN WITH MINERALS) tablet Take 1 tablet by mouth daily.        . pantoprazole  (PROTONIX) 40 MG tablet Take 40 mg by mouth 4 (four) times a week. Monday, Tuesday, Thursday and Saturday      . pravastatin (PRAVACHOL) 80 MG tablet Take 1 tablet (80 mg total) by mouth daily.  90 tablet  3  . warfarin (COUMADIN) 5 MG tablet Take 2.5-5 mg by mouth daily. 1 tablet (5 mg) on Monday, Wednesday and Friday, 1/2 tablet (2.5 mg) on all other days       Scheduled:  . amiodarone  200 mg Oral Daily  . [COMPLETED] azithromycin (ZITHROMAX) 500 MG IVPB  500 mg Intravenous Once  . carvedilol  12.5 mg Oral BID WC  . ceFEPime (MAXIPIME) IV  1 g Intravenous Q12H  . [COMPLETED] cefTRIAXone (ROCEPHIN)  IV  1 g Intravenous Once  . digoxin  125 mcg Oral Daily  . docusate sodium  100 mg Oral BID  . fentaNYL      . [START ON 07/07/2012] finasteride  5 mg Oral 4 times weekly  . fluticasone  2 spray Each Nare Daily  . magnesium oxide  400 mg Oral Daily  . midazolam      . [START ON 07/07/2012] pantoprazole  40 mg Oral 4 times weekly  . simvastatin  40 mg Oral q1800  . sodium chloride  3 mL Intravenous Q12H  . Warfarin - Pharmacist Dosing Inpatient   Does not apply q1800  . [DISCONTINUED] warfarin  2.5-5 mg Oral Daily   Assessment: 79 yo male admitted today with chest pain, SOB, recent CRT-P implant.  Antibiotics, azithromycin and ceftriaxone given in ED for PNA . Now to start on Vancomycin for PNA and continuing his chronic coumadin for h/o Afib.  INR reported as 2.6 on 07/04/12;  Admit INR pending.   Pltc 372 and Hgb 12.8 on admit. No bleeding noted. SCr 0.98, CrCl ~ 55 ml/min  Home dose of Coumadin is 5mg  every MWF and 2.5 mg all other days of the week. Last dose taken on 07/05/12.    Goal of Therapy:  Vancomycin trough level 15-20 mcg/ml INR 2-3 Monitor platelets by anticoagulation protocol: Yes  Plan:  Vancomycin 750 mg IV q12h Will follow up on admission INR result then order  today's dose of coumadin.  Daily PT/INR Monitor renal function, clinical status and culture results.   Noah Delaine, RPh Clinical Pharmacist Pager: [phone] 07/06/2012,1:51 PM    Addendum:  INR today is 2.71   PLAN:  Will continue with usual home dose of 5mg  po today x1 INR qAM  Noah Delaine, RPh Clinical Pharmacist Pager: [phone] 07/06/2012 16:11

## 2012-07-06 NOTE — ED Notes (Signed)
Cardiology at bedside.

## 2012-07-06 NOTE — Progress Notes (Signed)
Utilization Review completed.  Talesha Ellithorpe RN CM  

## 2012-07-06 NOTE — H&P (Signed)
ELECTROPHYSIOLOGY CONSULT NOTE  Patient ID: [name] MRN: [id number], DOB/AGE: 03/15/1929   Date of Consultation: 07/06/2012  Primary Physician: Birdie Sons, MD Primary Cardiologist / EP: Shawnie Pons, MD / Ladona Ridgel, MD Reason for Consultation: Chest pain, SOB, recent CRT-P implant  History of Present Illness [name] is a pleasant 79 year old man with NICM, transient CHB s/p PPM, recent PPM system infection who is now s/p CRT-P implant and extraction of previously implanted temp perm pacemaker on 06/28/2012. He had subsequent presumed microperforation of his RA and RV leads requiring lead revision on both 04/23 and 04/24. On the morning of 07/01/2012, he was chest pain free and his device was functioning normally so he was discharged to home. Over the weekend, he had increasing fatigue and shortness of breath. He also reported a frequent but non-productive cough. He was instructed to call our office for an appointment. He was evaluated by Dr. Johney Frame in the office on Monday 07/04/2012. His device was interrogated today and found to be functioning normally. ECGs demonstrated a QRS duration of 160 msec with CRT pacing and 128 msec without CRT pacing with prolonged AV delay ( ). His device was then reprogrammed today to promote intrinsic conduction given his intrinsic QRS is more narrow than with BiV pacing/CRT. A limited echo was done which was negative for pericardial effusion. CXR demonstrated increased atelectasis at the medial right lung base with small stable pleural effusions. He felt fairly well until yesterday when he developed "sharp" right lower chest and flank pain, worse with inspiration. He also reports worsening SOB and fever of 101.7 last night. He also reports worsening cough and chills. He denies pain, swelling, redness, warmth or drainage at the implant/explant sites. He denies LE or abdominal swelling, orthopnea or PND. He reports compliance with his  medications.  Past Medical History Past Medical History  Diagnosis Date  . Coronary artery disease   . Cardiomyopathy primary-nonischemic EF 45%  . Hyperlipidemia   . GERD (gastroesophageal reflux disease)   . Polymyalgia rheumatica   . Sick sinus syndrome   . Atrial fibrillation   . Dysphagia   . Hearing loss, mixed, bilateral   . Increased prostate specific antigen (PSA) velocity   . Lumbar back pain   . BPH (benign prostatic hypertrophy)   . Breast mass     "on both sides" (06/28/2012)  . CHF (congestive heart failure)   . Elevated liver enzymes   . Esophageal stricture   . Pacemaker   . Arthritis     "some" (06/28/2012)    Past Surgical History Past Surgical History  Procedure Laterality Date  . Cataract extraction w/ intraocular lens  implant, bilateral Bilateral   . Umbilical hernia repair    . Insert / replace / remove pacemaker    . Inguinal hernia repair Right 2012    RIH  . Umbilical hernia repair    . Cardioversion  06/17/2011    Procedure: CARDIOVERSION;  Surgeon: [name], MD;  Location: [facility] OR;  Service: Cardiovascular;  Laterality: N/A;  . Cardioversion  09/09/2011    Procedure: CARDIOVERSION;  Surgeon: Luis Abed, MD;  Location: [facility] OR;  Service: Cardiovascular;  Laterality: N/A;  . Cardioversion  01/01/2012    Procedure: CARDIOVERSION;  Surgeon: Herby Abraham, MD;  Location: [facility] ENDOSCOPY;  Service: Cardiovascular;  Laterality: N/A;  . Tee without cardioversion  01/01/2012    Procedure: TRANSESOPHAGEAL ECHOCARDIOGRAM (TEE);  Surgeon: Pricilla Riffle, MD;  Location: [facility] ENDOSCOPY;  Service: Cardiovascular;  Laterality: N/A;  . Eye surgery    . Generator removal Left 06/15/2012    Procedure: GENERATOR REMOVAL;  Surgeon: Marinus Maw, MD;  Location: [facility] OR;  Service: Cardiovascular;  Laterality: Left;  . Pacemaker generator change N/A 06/15/2012    Procedure: PACEMAKER GENERATOR CHANGE;  Surgeon: Marinus Maw, MD;  Location: [facility] OR;  Service:  Cardiovascular;  Laterality: N/A;  . Tonsillectomy and adenoidectomy    . Cardiac catheterization      Allergies/Intolerances Allergies  Allergen Reactions  . Antihistamines, Diphenhydramine-Type Other (See Comments)    Causes difficulty in ability to urinate.   Current Home Medications      acetaminophen 500 MG tablet  Commonly known as:  TYLENOL  Take 500 mg by mouth daily as needed for pain.     amiodarone 200 MG tablet  Commonly known as:  PACERONE  Take 1 tablet (200 mg total) by mouth daily.     carvedilol 12.5 MG tablet  Commonly known as:  COREG  Take 12.5 mg by mouth 2 (two) times daily with a meal.     digoxin 0.25 MG tablet  Commonly known as:  LANOXIN  Take 0.5 tablets (125 mcg total) by mouth daily.     finasteride 5 MG tablet  Commonly known as:  PROSCAR  Take 5 mg by mouth 4 (four) times a week. Monday, Tuesday, Thursday, Saturday     fluticasone 50 MCG/ACT nasal spray  Commonly known as:  FLONASE  Place 2 sprays into the nose daily.     magnesium oxide 400 MG tablet  Commonly known as:  MAG-OX  Take 400 mg by mouth daily.     multivitamin with minerals tablet  Take 1 tablet by mouth daily.     pantoprazole 40 MG tablet  Commonly known as:  PROTONIX  Take 40 mg by mouth 4 (four) times a week. Monday, Tuesday, Thursday and Saturday     pravastatin 80 MG tablet  Commonly known as:  PRAVACHOL  Take 1 tablet (80 mg total) by mouth daily.     warfarin 5 MG tablet  Commonly known as:  COUMADIN  Take 2.5-5 mg by mouth daily. 1 tablet (5 mg) on Monday, Wednesday and Friday, 1/2 tablet (2.5 mg) on all other days     Family History Family History  Problem Relation Age of Onset  . Heart attack Father 51    deceased  . Aneurysm Mother     deceased AAA  . Hypertension Mother   . Other Brother     deceased at birth    Social History Social History  . Marital Status: Married   Social History Main Topics  . Smoking status: Former Smoker -- 1.00  packs/day for 40 years    Types: Cigarettes    Quit date: 03/10/1983  . Smokeless tobacco: Never Used  . Alcohol Use: No  . Drug Use: No   Review of Systems General: +fever +chills +fatigue  No night sweats or weight changes.  Cardiovascular: +CP +SOB  No edema, orthopnea, palpitations, paroxysmal nocturnal dyspnea. Dermatological: No rash, lesions or masses. Respiratory: +cough +SOB Urologic: No hematuria, dysuria. Abdominal: No nausea, vomiting, diarrhea, bright red blood per rectum, melena, or hematemesis. Neurologic: No visual changes, weakness, changes in mental status. All other systems reviewed and are otherwise negative except as noted above.  Physical Exam Blood pressure 125/62, pulse 74, resp. rate 22, SpO2 96.00%.  General: Well developed, well appearing 79 year old male  in no acute distress. HEENT: Normocephalic, atraumatic. EOMs intact. Sclera nonicteric. Oropharynx clear.  Neck: Supple without bruits. No JVD. Lungs: Respirations regular and unlabored. Diminished breath sounds at right base otherwise CTA bilaterally. No wheezes, rales or rhonchi. Heart: Regular. S1, S2 present. No murmurs, rub, S3 or S4. Abdomen: Soft, non-tender, non-distended. BS present x 4 quadrants. No hepatosplenomegaly.  Extremities: No clubbing, cyanosis or edema. DP/PT/Radials 2+ and equal bilaterally. Psych: Normal affect. Neuro: Alert and oriented X 3. Moves all extremities spontaneously. Musculoskeletal: No kyphosis. Skin: Intact. Warm and dry. No rashes or petechiae in exposed areas. Left upper chest implant site intact with Steri-strips in place and appears well-healing. Right upper chest explant site intact and appears well-healing. There are no signs of inflammation or infection at either site, specifically no edema, erythema, warmth, hematoma, bleeding or drainage.    Labs Lab Results  Component Value Date   WBC 13.0* 07/06/2012   HGB 12.8* 07/06/2012   HCT 36.6* 07/06/2012   MCV 83.8  07/06/2012   PLT 372 07/06/2012    Recent Labs Lab 07/06/12 0750  NA 134*  K 4.4  CL 99  CO2 25  BUN 15  CREATININE 0.98  CALCIUM 9.3  PROT 7.1  BILITOT 1.0  ALKPHOS 72  ALT 61*  AST 62*  GLUCOSE 118*    Radiology/Studies Dg Chest 2 View  07/06/2012  *RADIOLOGY REPORT*  Clinical Data: Right-sided chest pain. Shortness of breath. Fever.  CHEST - 2 VIEW  Comparison: 07/04/2012.  Findings: Biventricular pacer in place with leads unchanged in position.  Increase in size of right-sided pleural effusion.  Right base atelectasis suspected.  Limited for detecting the right base infiltrate or mass.  Central pulmonary vascular prominence.  Limited evaluation of cardiac contour secondary to the right base pleural effusion.  Slightly tortuous calcified aorta.  IMPRESSION: Increase in size of right-sided pleural effusion.  Please see above.   Original Report Authenticated By: Lacy Duverney, M.D.    Dg Chest 2 View  07/04/2012  *RADIOLOGY REPORT*  Clinical Data: Cough and shortness of breath 1 day following pacemaker insertion on 06/30/2012.  Ex-smoker.  CHEST - 2 VIEW  Comparison: 07/01/2012 at [facility].  Findings: Normal sized heart.  Increased atelectasis at the medial right lung base.  No significant change in small bilateral pleural effusions.  Normal vascularity.  Mild thoracic spine degenerative changes.  Stable left subclavian pacemaker leads.  IMPRESSION:  1.  Increased atelectasis at the medial right lung base. 2.  Stable small bilateral pleural effusions.   Original Report Authenticated By: Beckie Salts, M.D.    Dg Chest 2 View  06/30/2012  *RADIOLOGY REPORT*  Clinical Data: Status post pacemaker placement unable to raise arm  CHEST - 2 VIEW  Comparison: 06/29/2012  Findings: The cardiac shadow is stable.  Pacemaker leads are again identified although the atrial lead appears different than that seen on the recent exam from previous day.  Correlation with the patients clinical history  is recommended.  Persistent increased change is noted in the medial aspect of the right lung base consistent with evolving infiltrate.  The left lung remains clear. No pneumothorax is noted.  The humeral head is somewhat high-riding on the left which may be related to an underlying rotator cuff injury.  IMPRESSION: Change in the appearance of the atrial lead when compared with the previous day.  Clinical correlation is recommended.  Increasing right basilar infiltrate.   Original Report Authenticated By: Alcide Clever, M.D.  Echocardiogram 06/30/2012 Study Conclusions - Left ventricle: The cavity size was normal. Wall thickness was normal. Systolic function was mildly reduced. The estimated ejection fraction was in the range of 45% to 50%. Images were inadequate for LV wall motion assessment. - Aortic valve: There was no stenosis. - Mitral valve: Trivial regurgitation. - Left atrium: The atrium was mildly dilated. - Right ventricle: Poorly visualized. Pacer wire or catheter noted in right ventricle. - Right atrium: The atrium was mildly dilated. - Pulmonary arteries: No complete TR doppler jet so unable to estimate PA systolic pressure. - Systemic veins: IVC measured 2.1 cm with some respirophasic variation, suggesting RA pressure 10 mmHg. - Pericardium, extracardiac: There was no pericardial effusion.   12-lead ECG on presentation shows A paced V sensed rhythm at 74 bpm   Assessment and Plan 1. PNA - prior CXRs (06/30/2012) noted increasing right basilar opacity/infiltrate, now with right sided effusion most consistent with PNA - blood cultures ordered; start abx, spoke with Dr. Anitra Lauth and will start IV Rocephin and IV azithromycin now; consider ID consult for recommendations regarding appropriate abx regimen given his infection history 2. Recent PPM system infection, now s/p CRT-P implant and extraction of previously implanted temp perm pacemaker on 06/28/2012 complicated by  microperforation; no evidence of pocket infection or wound dehiscence  3. NICM, EF 45-50% by recent echo - not volume overloaded currently 4. Sinus node dysfunction and transient CHB s/p previous PPM implant  Dr. Ladona Ridgel to see Signed, Rick Duff, PA-C 07/06/2012, 9:16 AM  EP Attending  Patient seen and examined. Agree with above. He has fever, chills and right sided infiltrated. PPM functioning normally yesterday. He will need admit for treatment of pneumonia. I wonder if CT scan of the chest would add any value. Seems early for empyema. ?pleural effusion. We will follow with you.  Leonia Reeves.D.

## 2012-07-07 ENCOUNTER — Inpatient Hospital Stay (HOSPITAL_COMMUNITY): Payer: Medicare Other

## 2012-07-07 DIAGNOSIS — J90 Pleural effusion, not elsewhere classified: Secondary | ICD-10-CM

## 2012-07-07 DIAGNOSIS — I428 Other cardiomyopathies: Secondary | ICD-10-CM

## 2012-07-07 LAB — CBC
MCH: 29.2 pg (ref 26.0–34.0)
MCHC: 34.8 g/dL (ref 30.0–36.0)
MCV: 84 fL (ref 78.0–100.0)
Platelets: 338 10*3/uL (ref 150–400)
RBC: 4.07 MIL/uL — ABNORMAL LOW (ref 4.22–5.81)

## 2012-07-07 LAB — GLUCOSE, CAPILLARY: Glucose-Capillary: 116 mg/dL — ABNORMAL HIGH (ref 70–99)

## 2012-07-07 LAB — COMPREHENSIVE METABOLIC PANEL
ALT: 41 U/L (ref 0–53)
AST: 32 U/L (ref 0–37)
Albumin: 2.6 g/dL — ABNORMAL LOW (ref 3.5–5.2)
CO2: 27 mEq/L (ref 19–32)
Chloride: 100 mEq/L (ref 96–112)
Creatinine, Ser: 0.95 mg/dL (ref 0.50–1.35)
GFR calc non Af Amer: 76 mL/min — ABNORMAL LOW (ref >90)
Potassium: 4.2 mEq/L (ref 3.5–5.1)
Sodium: 134 mEq/L — ABNORMAL LOW (ref 135–145)
Total Bilirubin: 0.7 mg/dL (ref 0.3–1.2)

## 2012-07-07 LAB — PRO B NATRIURETIC PEPTIDE: Pro B Natriuretic peptide (BNP): 842.7 pg/mL — ABNORMAL HIGH (ref 0–450)

## 2012-07-07 LAB — DIGOXIN LEVEL: Digoxin Level: 0.9 ng/mL (ref 0.8–2.0)

## 2012-07-07 LAB — PROTIME-INR: Prothrombin Time: 28.7 seconds — ABNORMAL HIGH (ref 11.6–15.2)

## 2012-07-07 MED ORDER — WARFARIN SODIUM 2.5 MG PO TABS
2.5000 mg | ORAL_TABLET | Freq: Once | ORAL | Status: DC
  Filled 2012-07-07: qty 1

## 2012-07-07 NOTE — Progress Notes (Signed)
ANTICOAGULATION CONSULT NOTE - Follow Up Consult  Pharmacy Consult for Coumadin Indication: atrial fibrillation  Allergies  Allergen Reactions  . Antihistamines, Diphenhydramine-Type Other (See Comments)    Causes difficulty in ability to urinate.   Labs:  Recent Labs  07/06/12 0750 07/06/12 1525 07/07/12 0425  HGB 12.8*  --  11.9*  HCT 36.6*  --  34.2*  PLT 372  --  338  LABPROT  --  27.4* 28.7*  INR  --  2.71* 2.88*  CREATININE 0.98  --  0.95    Estimated Creatinine Clearance: 57 ml/min (by C-G formula based on Cr of 0.95).  Assessment: 79 year old male on Coumadin PTA for Afib.  INR therapeutic today.  No bleeding noted.  Goal of Therapy:  INR 2-3 Monitor platelets by anticoagulation protocol: Yes   Plan:  1) Coumadin 2.5 mg po x 1 dose at 1800 pm per home regimen 2) Daily INR  Thank you. Okey Regal, PharmD [phone]  07/07/2012,4:13 PM

## 2012-07-07 NOTE — Evaluation (Signed)
Physical Therapy Evaluation Patient Details Name: [name] MRN: [id number] DOB: 1929-05-08 Today's Date: 07/07/2012 Time: 1005-1030 PT Time Calculation (min): 25 min  PT Assessment / Plan / Recommendation Clinical Impression  Pt is an 79 y.o. male with recent hospitalization for complicated course surrounding pacemaker. Pt currently being seen for pleuritic chest pain management. Patient demonstrates independence with all aspects of mobility in room and supervision for longer ambulation. Patient able to perform stairs negotiation without difficulty. Spoke with patient at length and educated regarding energy conservation techniques, pursed lip breathing and importance of proactive rest breaks.  Upon conclusion of session patient was able to reiterate all key points of education. Pt demonstrates some DOE and O2 saturations range from 93 at rest to 87 with activity; rebounds quickly with rest and breathing techniques. Encouraged continued activity. Do not feel that patient requires continued PT services at this time. Acute PT will sign off. Pt in agreement.    PT Assessment  Patent does not need any further PT services    Follow Up Recommendations  No PT follow up          Equipment Recommendations  None recommended by PT          Precautions / Restrictions Restrictions Weight Bearing Restrictions: No   Pertinent Vitals/Pain No pain at this time; SpO2 on rm air prior to session at rest 93%; 87% with activity on rm air      Mobility  Bed Mobility Bed Mobility: Supine to Sit;Sitting - Scoot to Edge of Bed Supine to Sit: 7: Independent Sitting - Scoot to [place] of Bed: 7: Independent Transfers Transfers: Sit to Stand;Stand to Sit Sit to Stand: 7: Independent Stand to Sit: 7: Independent Ambulation/Gait Ambulation/Gait Assistance: 5: Supervision Ambulation Distance (Feet): 300 Feet Assistive device: None Ambulation/Gait Assistance Details: stead ywith ambulation; rest  breaks taken every 50 ft for proactive energy conservation as SpO2 at rest was 93 and with activity fell to 87% on rm air. Gait Pattern: Within Functional Limits Gait velocity: wfl for community ambulation General Gait Details: overall good stability; some deconditioning present Stairs: Yes Stairs Assistance: 5: Supervision Stairs Assistance Details (indicate cue type and reason): VCs for energy conservation and rest breaks Stair Management Technique: One rail Right;Alternating pattern;Forwards Number of Stairs: 12      Visit Information  Last PT Received On: 07/07/12 Assistance Needed: +1    Subjective Data  Subjective: I am feeling better, a little SOB Patient Stated Goal: to go home with his wife   Prior Functioning  Home Living Lives With: Spouse Available Help at Discharge: Family;Available 24 hours/day Type of Home: House Home Access: Stairs to enter [employer] of Steps: 2 Entrance Stairs-Rails: None Home Layout: Two level;1/2 bath on main level;Bed/bath upstairs Alternate Level Stairs-Number of Steps: 12 Alternate Level Stairs-Rails: Right Bathroom Shower/Tub: Engineer, manufacturing systems: Standard Home Adaptive Equipment: None Prior Function Level of Independence: Independent Able to Take Stairs?: Yes Driving: Yes Vocation: Retired Musician: No difficulties    Copywriter, advertising Arousal/Alertness: Awake/alert Behavior During Therapy: WFL for tasks assessed/performed Overall Cognitive Status: Within Functional Limits for tasks assessed    Extremity/Trunk Assessment Right Upper Extremity Assessment RUE ROM/Strength/Tone: Within functional levels RUE Sensation: WFL - Light Touch;WFL - Proprioception RUE Coordination: WFL - gross/fine motor Left Upper Extremity Assessment LUE ROM/Strength/Tone: Within functional levels LUE Sensation: WFL - Light Touch;WFL - Proprioception LUE Coordination: WFL - gross/fine motor Right  Lower Extremity Assessment RLE ROM/Strength/Tone: Within functional levels RLE  Sensation: WFL - Light Touch;WFL - Proprioception RLE Coordination: WFL - gross/fine motor Left Lower Extremity Assessment LLE ROM/Strength/Tone: Within functional levels LLE Sensation: WFL - Light Touch;WFL - Proprioception LLE Coordination: WFL - gross/fine motor   Balance Balance Balance Assessed: Yes High Level Balance High Level Balance Activites: Side stepping;Backward walking;Direction changes;Turns;Sudden stops;Head turns High Level Balance Comments: good stability with activities of balance  End of Session PT - End of Session Equipment Utilized During Treatment: Gait belt Activity Tolerance: Patient tolerated treatment well;Other (comment) (SpO2 to 87% with ambulation) Patient left: in bed;with call bell/phone within reach;with family/visitor present (seated EOB) Nurse Communication: Mobility status;Other (comment) (SpO2 93 rm air, desats to 87% with activity nsg to spot chk)  GP     Fabio Asa 07/07/2012, 11:07 AM Charlotte Crumb, PT DPT  [phone]

## 2012-07-07 NOTE — Progress Notes (Signed)
Subjective: The patient is sitting up in a chair. He has less right-sided pleurisy. He feels short of breath when he walks around in the room, but less short of breath at rest.  Objective: Vital signs in last 24 hours: Filed Vitals:   07/06/12 1327 07/06/12 2143 07/07/12 0500 07/07/12 0556  BP:  107/48  110/58  Pulse:  70  70  Temp:  98.9 F (37.2 C)  98.9 F (37.2 C)  TempSrc:  Oral  Oral  Resp:  19  18  Height: 5\' 7"  (1.702 m)     Weight: 73.6 kg (162 lb 4.1 oz)  74.8 kg (164 lb 14.5 oz) 74.8 kg (164 lb 14.5 oz)  SpO2:  93%  91%    Intake/Output Summary (Last 24 hours) at 07/07/12 1003 Last data filed at 07/07/12 0730  Gross per 24 hour  Intake    360 ml  Output    500 ml  Net   -140 ml    Weight change:   Physical exam: General: Pleasant 79 year old Caucasian man sitting up in the chair, in no acute distress. Lungs: Globally decreased breath sounds on the right and clear on the left. Breathing not as labored when speaking. Heart: Irregular, irregular. Abdomen: Positive bowel sounds, soft, nontender, nondistended. Extremities: No pedal edema.  Lab Results: Basic Metabolic Panel:  Recent Labs  81/19/14 0750 07/07/12 0425  NA 134* 134*  K 4.4 4.2  CL 99 100  CO2 25 27  GLUCOSE 118* 138*  BUN 15 17  CREATININE 0.98 0.95  CALCIUM 9.3 8.9   Liver Function Tests:  Recent Labs  07/06/12 0750 07/07/12 0425  AST 62* 32  ALT 61* 41  ALKPHOS 72 63  BILITOT 1.0 0.7  PROT 7.1 6.3  ALBUMIN 3.0* 2.6*   No results found for this basename: LIPASE, AMYLASE,  in the last 72 hours No results found for this basename: AMMONIA,  in the last 72 hours CBC:  Recent Labs  07/06/12 0750 07/07/12 0425  WBC 13.0* 13.6*  NEUTROABS 9.0*  --   HGB 12.8* 11.9*  HCT 36.6* 34.2*  MCV 83.8 84.0  PLT 372 338   Cardiac Enzymes: No results found for this basename: CKTOTAL, CKMB, CKMBINDEX, TROPONINI,  in the last 72 hours BNP:  Recent Labs  07/07/12 0425  PROBNP  842.7*   D-Dimer: No results found for this basename: DDIMER,  in the last 72 hours CBG:  Recent Labs  07/07/12 0558  GLUCAP 116*   Hemoglobin A1C: No results found for this basename: HGBA1C,  in the last 72 hours Fasting Lipid Panel: No results found for this basename: CHOL, HDL, LDLCALC, TRIG, CHOLHDL, LDLDIRECT,  in the last 72 hours Thyroid Function Tests: No results found for this basename: TSH, T4TOTAL, FREET4, T3FREE, THYROIDAB,  in the last 72 hours Anemia Panel: No results found for this basename: VITAMINB12, FOLATE, FERRITIN, TIBC, IRON, RETICCTPCT,  in the last 72 hours Coagulation:  Recent Labs  07/06/12 1525 07/07/12 0425  LABPROT 27.4* 28.7*  INR 2.71* 2.88*   Urine Drug Screen: Drugs of Abuse  No results found for this basename: labopia, cocainscrnur, labbenz, amphetmu, thcu, labbarb    Alcohol Level: No results found for this basename: ETH,  in the last 72 hours Urinalysis: No results found for this basename: COLORURINE, APPERANCEUR, LABSPEC, PHURINE, GLUCOSEU, HGBUR, BILIRUBINUR, KETONESUR, PROTEINUR, UROBILINOGEN, NITRITE, LEUKOCYTESUR,  in the last 72 hours Misc. Labs:   Micro: Recent Results (from the past 240 hour(s))  CULTURE,  BLOOD (ROUTINE X 2)     Status: None   Collection Time    07/06/12 11:00 AM      Result Value Range Status   Specimen Description BLOOD RIGHT ANTECUBITAL   Final   Special Requests BOTTLES DRAWN AEROBIC ONLY 5CC   Final   Culture  Setup Time 07/06/2012 16:16   Final   Culture     Final   Value:        BLOOD CULTURE RECEIVED NO GROWTH TO DATE CULTURE WILL BE HELD FOR 5 DAYS BEFORE ISSUING A FINAL NEGATIVE REPORT   Report Status PENDING   Incomplete  CULTURE, BLOOD (ROUTINE X 2)     Status: None   Collection Time    07/06/12 11:15 AM      Result Value Range Status   Specimen Description BLOOD HAND RIGHT   Final   Special Requests BOTTLES DRAWN AEROBIC ONLY 5CC   Final   Culture  Setup Time 07/06/2012 16:15   Final    Culture     Final   Value:        BLOOD CULTURE RECEIVED NO GROWTH TO DATE CULTURE WILL BE HELD FOR 5 DAYS BEFORE ISSUING A FINAL NEGATIVE REPORT   Report Status PENDING   Incomplete    Studies/Results: Dg Chest 2 View  07/06/2012  *RADIOLOGY REPORT*  Clinical Data: Right-sided chest pain. Shortness of breath. Fever.  CHEST - 2 VIEW  Comparison: 07/04/2012.  Findings: Biventricular pacer in place with leads unchanged in position.  Increase in size of right-sided pleural effusion.  Right base atelectasis suspected.  Limited for detecting the right base infiltrate or mass.  Central pulmonary vascular prominence.  Limited evaluation of cardiac contour secondary to the right base pleural effusion.  Slightly tortuous calcified aorta.  IMPRESSION: Increase in size of right-sided pleural effusion.  Please see above.   Original Report Authenticated By: Lacy Duverney, M.D.     Medications:  Scheduled: . amiodarone  200 mg Oral Daily  . benzonatate  100 mg Oral TID  . carvedilol  12.5 mg Oral BID WC  . ceFEPime (MAXIPIME) IV  1 g Intravenous Q12H  . digoxin  125 mcg Oral Daily  . docusate sodium  100 mg Oral BID  . finasteride  5 mg Oral 4 times weekly  . fluticasone  2 spray Each Nare Daily  . magnesium oxide  400 mg Oral Daily  . pantoprazole  40 mg Oral 4 times weekly  . simvastatin  40 mg Oral q1800  . sodium chloride  3 mL Intravenous Q12H  . vancomycin  750 mg Intravenous Q12H  . Warfarin - Pharmacist Dosing Inpatient   Does not apply q1800   Continuous:  ZOX:[id number], alum & mag hydroxide-simeth, HYDROcodone-acetaminophen, levalbuterol, morphine injection, ondansetron (ZOFRAN) IV, ondansetron  Assessment: Principal Problem:   Pleuritic chest pain Active Problems:   HCAP (healthcare-associated pneumonia)   Pacemaker infection   Pleural effusion on right   Atrial fibrillation   SICK SINUS SYNDROME   Nonischemic cardiomyopathy   Transaminitis  1. Pleuritic chest pain secondary  to pleural effusion and healthcare associated pneumonia. We'll continue supportive treatment, cefepime, and vancomycin. He is symptomatically improved. Strep pneumo urinary antigen negative. Blood cultures pending.  Right-sided pleural effusion. Query peripneumonic effusion. Ultrasound of the chest was ordered and the results are pending. If it is a moderate to large pleural effusion, was interventional radiology to perform a thoracentesis.   Recent pacemaker infection, status post extraction of previous pacemaker  and insertion of another. Followed by cardiologist Dr. Ladona Ridgel.  Nonischemic cardiomyopathy and hypertension. Currently stable. ProBNP is modestly elevated at 843. Continue digoxin, Coreg, statin.  Chronic atrial fibrillation. Currently stable and controlled. Anticoagulated. Digoxin level in the therapeutic range. Continue amiodarone, digoxin, and coronary.  Mild hepatic transaminitis. Resolved. Possibly secondary to right-sided pleural effusion/pneumonia.   Plan:  1. Continue treatment as above. 2. Will followup on the results of the ultrasound of the chest. If it is a moderate to large effusion, consider thoracentesis.   LOS: 1 day   [name] 07/07/2012, 10:03 AM

## 2012-07-07 NOTE — Progress Notes (Signed)
Physical Therapy Discharge Patient Details Name: [name] MRN: [id number] DOB: 04/02/1929 Today's Date: 07/07/2012 Time: 1005-1030 PT Time Calculation (min): 25 min  Patient discharged from PT services secondary to goals met and no further PT needs identified.  Please see latest therapy progress note for current level of functioning and progress toward goals.    Progress and discharge plan discussed with patient and/or caregiver: Patient/Caregiver agrees with plan  GP     Fabio Asa 07/07/2012, 11:08 AM

## 2012-07-08 ENCOUNTER — Inpatient Hospital Stay (HOSPITAL_COMMUNITY): Payer: Medicare Other

## 2012-07-08 ENCOUNTER — Other Ambulatory Visit (HOSPITAL_COMMUNITY): Payer: Medicare Other

## 2012-07-08 LAB — BODY FLUID CELL COUNT WITH DIFFERENTIAL
Lymphs, Fluid: 14 %
Monocyte-Macrophage-Serous Fluid: 23 % — ABNORMAL LOW (ref 50–90)
Neutrophil Count, Fluid: 62 % — ABNORMAL HIGH (ref 0–25)

## 2012-07-08 LAB — BASIC METABOLIC PANEL
CO2: 28 mEq/L (ref 19–32)
Chloride: 97 mEq/L (ref 96–112)
GFR calc Af Amer: >90 mL/min (ref >90)
Potassium: 3.9 mEq/L (ref 3.5–5.1)

## 2012-07-08 LAB — CBC
MCV: 83.1 fL (ref 78.0–100.0)
Platelets: 334 10*3/uL (ref 150–400)
RBC: 3.96 MIL/uL — ABNORMAL LOW (ref 4.22–5.81)
RDW: 14 % (ref 11.5–15.5)
WBC: 12.2 10*3/uL — ABNORMAL HIGH (ref 4.0–10.5)

## 2012-07-08 LAB — LACTATE DEHYDROGENASE: LDH: 292 U/L — ABNORMAL HIGH (ref 94–250)

## 2012-07-08 LAB — PROTEIN, BODY FLUID: Total protein, fluid: 3.8 g/dL

## 2012-07-08 LAB — LACTATE DEHYDROGENASE, PLEURAL OR PERITONEAL FLUID: LD, Fluid: 1831 U/L — ABNORMAL HIGH (ref 3–23)

## 2012-07-08 LAB — PROTIME-INR: INR: 2.75 — ABNORMAL HIGH (ref 0.00–1.49)

## 2012-07-08 LAB — PRO B NATRIURETIC PEPTIDE: Pro B Natriuretic peptide (BNP): 727.3 pg/mL — ABNORMAL HIGH (ref 0–450)

## 2012-07-08 MED ORDER — PRAVASTATIN SODIUM 40 MG PO TABS
80.0000 mg | ORAL_TABLET | Freq: Every day | ORAL | Status: DC
  Administered 2012-07-08 – 2012-07-16 (×9): 80 mg via ORAL
  Filled 2012-07-08 (×11): qty 2

## 2012-07-08 MED ORDER — PIPERACILLIN-TAZOBACTAM 3.375 G IVPB
3.3750 g | Freq: Three times a day (TID) | INTRAVENOUS | Status: DC
  Administered 2012-07-08 – 2012-07-14 (×18): 3.375 g via INTRAVENOUS
  Filled 2012-07-08 (×21): qty 50

## 2012-07-08 MED ORDER — WARFARIN SODIUM 5 MG PO TABS
5.0000 mg | ORAL_TABLET | Freq: Once | ORAL | Status: AC
  Administered 2012-07-08: 5 mg via ORAL
  Filled 2012-07-08: qty 1

## 2012-07-08 NOTE — Progress Notes (Signed)
Subjective: The patient was seen prior to the thoracentesis. He was lying in bed, with slightly more shortness of breath this morning than yesterday. He has a cough of clear to mildly tanned sputum. His right-sided pleurisy and is about stable, but it is being managed with analgesics.  Objective: Vital signs in last 24 hours: Filed Vitals:   07/08/12 1045 07/08/12 1057 07/08/12 1116 07/08/12 1159  BP: 110/54 109/48 109/50   Pulse:    81  Temp:      TempSrc:      Resp:      Height:      Weight:      SpO2:        Intake/Output Summary (Last 24 hours) at 07/08/12 1204 Last data filed at 07/08/12 0533  Gross per 24 hour  Intake   1280 ml  Output   2275 ml  Net   -995 ml    Weight change: -0.2 kg (-7.1 oz)  Physical exam: General: Pleasant 79 year old Caucasian man laying in bed, slightly dyspneic with speaking. Lungs: Globally decreased breath sounds on the right and clear on the left. Breathing not as labored when speaking. Heart: Irregular, irregular. Abdomen: Positive bowel sounds, soft, nontender, nondistended. Extremities: No pedal edema.  Lab Results: Basic Metabolic Panel:  Recent Labs  40/98/11 0425 07/08/12 0515  NA 134* 132*  K 4.2 3.9  CL 100 97  CO2 27 28  GLUCOSE 138* 139*  BUN 17 16  CREATININE 0.95 0.86  CALCIUM 8.9 8.6   Liver Function Tests:  Recent Labs  07/06/12 0750 07/07/12 0425  AST 62* 32  ALT 61* 41  ALKPHOS 72 63  BILITOT 1.0 0.7  PROT 7.1 6.3  ALBUMIN 3.0* 2.6*   No results found for this basename: LIPASE, AMYLASE,  in the last 72 hours No results found for this basename: AMMONIA,  in the last 72 hours CBC:  Recent Labs  07/06/12 0750 07/07/12 0425 07/08/12 0515  WBC 13.0* 13.6* 12.2*  NEUTROABS 9.0*  --   --   HGB 12.8* 11.9* 11.5*  HCT 36.6* 34.2* 32.9*  MCV 83.8 84.0 83.1  PLT 372 338 334   Cardiac Enzymes: No results found for this basename: CKTOTAL, CKMB, CKMBINDEX, TROPONINI,  in the last 72  hours BNP:  Recent Labs  07/07/12 0425 07/08/12 0902  PROBNP 842.7* 727.3*   D-Dimer: No results found for this basename: DDIMER,  in the last 72 hours CBG:  Recent Labs  07/07/12 0558  GLUCAP 116*   Hemoglobin A1C: No results found for this basename: HGBA1C,  in the last 72 hours Fasting Lipid Panel: No results found for this basename: CHOL, HDL, LDLCALC, TRIG, CHOLHDL, LDLDIRECT,  in the last 72 hours Thyroid Function Tests: No results found for this basename: TSH, T4TOTAL, FREET4, T3FREE, THYROIDAB,  in the last 72 hours Anemia Panel: No results found for this basename: VITAMINB12, FOLATE, FERRITIN, TIBC, IRON, RETICCTPCT,  in the last 72 hours Coagulation:  Recent Labs  07/07/12 0425 07/08/12 0515  LABPROT 28.7* 27.7*  INR 2.88* 2.75*   Urine Drug Screen: Drugs of Abuse  No results found for this basename: labopia,  cocainscrnur,  labbenz,  amphetmu,  thcu,  labbarb    Alcohol Level: No results found for this basename: ETH,  in the last 72 hours Urinalysis: No results found for this basename: COLORURINE, APPERANCEUR, LABSPEC, PHURINE, GLUCOSEU, HGBUR, BILIRUBINUR, KETONESUR, PROTEINUR, UROBILINOGEN, NITRITE, LEUKOCYTESUR,  in the last 72 hours Misc. Labs:   Micro: Recent  Results (from the past 240 hour(s))  CULTURE, BLOOD (ROUTINE X 2)     Status: None   Collection Time    07/06/12 11:00 AM      Result Value Range Status   Specimen Description BLOOD RIGHT ANTECUBITAL   Final   Special Requests BOTTLES DRAWN AEROBIC ONLY 5CC   Final   Culture  Setup Time 07/06/2012 16:16   Final   Culture     Final   Value:        BLOOD CULTURE RECEIVED NO GROWTH TO DATE CULTURE WILL BE HELD FOR 5 DAYS BEFORE ISSUING A FINAL NEGATIVE REPORT   Report Status PENDING   Incomplete  CULTURE, BLOOD (ROUTINE X 2)     Status: None   Collection Time    07/06/12 11:15 AM      Result Value Range Status   Specimen Description BLOOD HAND RIGHT   Final   Special Requests BOTTLES  DRAWN AEROBIC ONLY 5CC   Final   Culture  Setup Time 07/06/2012 16:15   Final   Culture     Final   Value:        BLOOD CULTURE RECEIVED NO GROWTH TO DATE CULTURE WILL BE HELD FOR 5 DAYS BEFORE ISSUING A FINAL NEGATIVE REPORT   Report Status PENDING   Incomplete    Studies/Results: Dg Chest 1 View  07/08/2012  *RADIOLOGY REPORT*  Clinical Data: Post right thoracentesis.  Cough.  CHEST - 1 VIEW  Comparison: 07/06/2012.  Findings: Trachea is midline.  Heart size stable.  Pacemaker lead tips are stable in position.  There is a moderate right pleural effusion, not significantly changed in size from 07/06/2012. Associated right basilar airspace consolidation.  No pneumothorax. Left lung is clear.  IMPRESSION:  1.  No pneumothorax after right thoracentesis. 2.  Minimal change in size of a moderate right pleural effusion with right basilar airspace consolidation.  Follow-up to clearing is recommended.   Original Report Authenticated By: Leanna Battles, M.D.    [place] Chest  07/07/2012  *RADIOLOGY REPORT*  Clinical Data: Assess for right pleural effusion.  CHEST ULTRASOUND  Comparison: 07/06/2012 chest x-ray.  Findings: There is a moderate-to-large right pleural effusion. This is complex with multiple internal septations noted.  IMPRESSION: Moderate to large complex/septated right pleural effusion.   Original Report Authenticated By: Charlett Nose, M.D.     Medications:  Scheduled: . amiodarone  200 mg Oral Daily  . benzonatate  100 mg Oral TID  . carvedilol  12.5 mg Oral BID WC  . digoxin  125 mcg Oral Daily  . docusate sodium  100 mg Oral BID  . finasteride  5 mg Oral 4 times weekly  . fluticasone  2 spray Each Nare Daily  . magnesium oxide  400 mg Oral Daily  . pantoprazole  40 mg Oral 4 times weekly  . piperacillin-tazobactam (ZOSYN)  IV  3.375 g Intravenous Q8H  . simvastatin  40 mg Oral q1800  . sodium chloride  3 mL Intravenous Q12H  . vancomycin  750 mg Intravenous Q12H  . Warfarin - Pharmacist  Dosing Inpatient   Does not apply q1800   Continuous:  ZOX:[id number], alum & mag hydroxide-simeth, HYDROcodone-acetaminophen, levalbuterol, morphine injection, ondansetron (ZOFRAN) IV, ondansetron  Assessment: Principal Problem:   Pleuritic chest pain Active Problems:   HCAP (healthcare-associated pneumonia)   Pacemaker infection   Pleural effusion on right   Atrial fibrillation   SICK SINUS SYNDROME   Nonischemic cardiomyopathy   Transaminitis  1.  Pleuritic chest pain secondary to pleural effusion and healthcare associated pneumonia. Ultrasound on 07/07/2012 revealed a moderate to large complex/septated right pleural effusion. Interventional radiology was consulted and performed a diagnostic and therapeutic thoracentesis today. It yielded 1.5 L of bloody fluid. Fluid studies have been ordered. We'll continue supportive treatment and antibiotics. We'll continue vancomycin, but will discontinue cefepime and start Zosyn instead for broader coverage. He is on hospital day #3 of antibiotics. vancomycin. Strep pneumo urinary antigen negative. Blood cultures negative to date. He is afebrile. His white blood cell count is starting to trend downward..  Right-sided pleural effusion/complex with septations. Query parapneumonic. As above.   Recent pacemaker infection, status post extraction of previous pacemaker and insertion of another. Followed by cardiologist Dr. Ladona Ridgel.  Nonischemic cardiomyopathy and hypertension. Currently stable. ProBNP is modestly elevated. Continue digoxin, Coreg, statin.  Chronic atrial fibrillation. Currently stable and controlled. Anticoagulated. Digoxin level in the therapeutic range. Continue amiodarone, digoxin, and carvedilol  Mild hepatic transaminitis. Resolved. Possibly secondary to right-sided pleural effusion/pneumonia.   Plan:  1. Cefepime discontinued. Zosyn started. Continue vancomycin. 2. Followup on the results of the pleural fluid. 3. Continue  supportive treatment. 4. Followup on laboratory studies ordered for in the morning.     LOS: 2 days   Janathan Bribiesca 07/08/2012, 12:04 PM

## 2012-07-08 NOTE — Progress Notes (Signed)
ANTIBIOTIC CONSULT NOTE - INITIAL  Pharmacy Consult for Zosyn Indication: rule out pneumonia  Allergies  Allergen Reactions  . Antihistamines, Diphenhydramine-Type Other (See Comments)    Causes difficulty in ability to urinate.    Patient Measurements: Height: 5\' 7"  (170.2 cm) Weight: 161 lb 13.1 oz (73.4 kg) IBW/kg (Calculated) : 66.1  Vital Signs: Temp: 98.4 F (36.9 C) (05/02 0529) Temp src: Oral (05/02 0529) BP: 127/57 mmHg (05/02 0529) Pulse Rate: 72 (05/02 0529) Intake/Output from previous day: 05/01 0701 - 05/02 0700 In: 1640 [P.O.:1440; IV Piggyback:200] Out: 2625 [Urine:2625] Intake/Output from this shift:    Labs:  Recent Labs  07/06/12 0750 07/07/12 0425 07/08/12 0515  WBC 13.0* 13.6* 12.2*  HGB 12.8* 11.9* 11.5*  PLT 372 338 334  CREATININE 0.98 0.95 0.86   Estimated Creatinine Clearance: 63 ml/min (by C-G formula based on Cr of 0.86). No results found for this basename: VANCOTROUGH, Leodis Binet, VANCORANDOM, GENTTROUGH, GENTPEAK, GENTRANDOM, TOBRATROUGH, TOBRAPEAK, TOBRARND, AMIKACINPEAK, AMIKACINTROU, AMIKACIN,  in the last 72 hours   Microbiology: Recent Results (from the past 720 hour(s))  SURGICAL PCR SCREEN     Status: None   Collection Time    06/15/12  9:13 AM      Result Value Range Status   MRSA, PCR NEGATIVE  NEGATIVE Final   Staphylococcus aureus NEGATIVE  NEGATIVE Final   Comment:            The Xpert SA Assay (FDA     approved for NASAL specimens     in patients over 14 years of age),     is one component of     a comprehensive surveillance     program.  Test performance has     been validated by [employer] for patients greater     than or equal to 69 year old.     It is not intended     to diagnose infection nor to     guide or monitor treatment.  MRSA PCR SCREENING     Status: None   Collection Time    06/15/12  8:18 PM      Result Value Range Status   MRSA by PCR NEGATIVE  NEGATIVE Final   Comment:            The  GeneXpert MRSA Assay (FDA     approved for NASAL specimens     only), is one component of a     comprehensive MRSA colonization     surveillance program. It is not     intended to diagnose MRSA     infection nor to guide or     monitor treatment for     MRSA infections.  CULTURE, BLOOD (ROUTINE X 2)     Status: None   Collection Time    07/06/12 11:00 AM      Result Value Range Status   Specimen Description BLOOD RIGHT ANTECUBITAL   Final   Special Requests BOTTLES DRAWN AEROBIC ONLY 5CC   Final   Culture  Setup Time 07/06/2012 16:16   Final   Culture     Final   Value:        BLOOD CULTURE RECEIVED NO GROWTH TO DATE CULTURE WILL BE HELD FOR 5 DAYS BEFORE ISSUING A FINAL NEGATIVE REPORT   Report Status PENDING   Incomplete  CULTURE, BLOOD (ROUTINE X 2)     Status: None   Collection Time    07/06/12 11:15 AM  Result Value Range Status   Specimen Description BLOOD HAND RIGHT   Final   Special Requests BOTTLES DRAWN AEROBIC ONLY 5CC   Final   Culture  Setup Time 07/06/2012 16:15   Final   Culture     Final   Value:        BLOOD CULTURE RECEIVED NO GROWTH TO DATE CULTURE WILL BE HELD FOR 5 DAYS BEFORE ISSUING A FINAL NEGATIVE REPORT   Report Status PENDING   Incomplete    Medical History: Past Medical History  Diagnosis Date  . Coronary artery disease   . Cardiomyopathy primary-nonischemic EF 45%  . Hyperlipidemia   . GERD (gastroesophageal reflux disease)   . Polymyalgia rheumatica   . Sick sinus syndrome   . Atrial fibrillation   . Dysphagia   . Hearing loss, mixed, bilateral   . Increased prostate specific antigen (PSA) velocity   . Lumbar back pain   . BPH (benign prostatic hypertrophy)   . Breast mass     "on both sides" (06/28/2012)  . CHF (congestive heart failure)   . Elevated liver enzymes   . Esophageal stricture   . Pacemaker   . Arthritis     "some" (06/28/2012)  . Pacemaker infection     06/28/12    Medications:  Scheduled:  . amiodarone  200 mg  Oral Daily  . benzonatate  100 mg Oral TID  . carvedilol  12.5 mg Oral BID WC  . digoxin  125 mcg Oral Daily  . docusate sodium  100 mg Oral BID  . finasteride  5 mg Oral 4 times weekly  . fluticasone  2 spray Each Nare Daily  . magnesium oxide  400 mg Oral Daily  . pantoprazole  40 mg Oral 4 times weekly  . simvastatin  40 mg Oral q1800  . sodium chloride  3 mL Intravenous Q12H  . vancomycin  750 mg Intravenous Q12H  . Warfarin - Pharmacist Dosing Inpatient   Does not apply q1800  . [DISCONTINUED] ceFEPime (MAXIPIME) IV  1 g Intravenous Q12H  . [DISCONTINUED] warfarin  2.5 mg Oral ONCE-1800   Assessment: 79 yo male known to pharmacy from vancomycin and warfarin management. Pharmacy to manage Zosyn for possible pneumonia.   Plan:  1. Zosyn 3.375gm IV Q8H (4 hr infusion)  Emeline Gins 07/08/2012,7:09 AM

## 2012-07-08 NOTE — Progress Notes (Signed)
ANTICOAGULATION CONSULT NOTE - Follow Up Consult  Pharmacy Consult:  Coumadin Indication:  History of AFib  Allergies  Allergen Reactions  . Antihistamines, Diphenhydramine-Type Other (See Comments)    Causes difficulty in ability to urinate.    Patient Measurements: Height: 5\' 7"  (170.2 cm) Weight: 161 lb 13.1 oz (73.4 kg) IBW/kg (Calculated) : 66.1  Vital Signs: Temp: 98.4 F (36.9 C) (05/02 0529) Temp src: Oral (05/02 0529) BP: 109/50 mmHg (05/02 1116) Pulse Rate: 81 (05/02 1159)  Labs:  Recent Labs  07/06/12 0750 07/06/12 1525 07/07/12 0425 07/08/12 0515  HGB 12.8*  --  11.9* 11.5*  HCT 36.6*  --  34.2* 32.9*  PLT 372  --  338 334  LABPROT  --  27.4* 28.7* 27.7*  INR  --  2.71* 2.88* 2.75*  CREATININE 0.98  --  0.95 0.86    Estimated Creatinine Clearance: 63 ml/min (by C-G formula based on Cr of 0.86).       Assessment: 89 YOM with history of Afib to resume Coumadin.  It was held on 07/07/12 for thoracentesis today.  INR remains therapeutic.  No bleeding reported.  Resume Coumadin today per discussion with Dr. Sherrie Mustache.  Patient continues on vancomycin and Zosyn for PNA.  S/p thoracentesis for pleural effusion.  His renal function remains stable.  Vanc 4/30 >> Cefepime 4/30 >> 5/2 Zosyn 5/2 >>  4/30 BC x 2 - NGTD    Goal of Therapy:  INR 2-3 Monitor platelets by anticoagulation protocol: Yes Vanc trough 15 - 20 mcg/mL    Plan:  - Coumadin 5mg  PO today - Daily PT / INR - Vanc 750mg  IV Q12H - Zosyn 3.375gm IV Q8H, 4 hr infusion - Monitor renal fxn, clinical course, vanc trough today     Donnita Farina D. Laney Potash, PharmD, BCPS Pager:  [phone] 07/08/2012, 1:14 PM

## 2012-07-08 NOTE — Procedures (Signed)
  US guided Rt thora 1.5L bloody fluid  Pt tolerated well CXR pending  Sent for labs per MD

## 2012-07-09 DIAGNOSIS — I495 Sick sinus syndrome: Secondary | ICD-10-CM

## 2012-07-09 DIAGNOSIS — I4891 Unspecified atrial fibrillation: Secondary | ICD-10-CM

## 2012-07-09 DIAGNOSIS — I2510 Atherosclerotic heart disease of native coronary artery without angina pectoris: Secondary | ICD-10-CM

## 2012-07-09 DIAGNOSIS — J189 Pneumonia, unspecified organism: Secondary | ICD-10-CM

## 2012-07-09 LAB — BASIC METABOLIC PANEL
Calcium: 8.3 mg/dL — ABNORMAL LOW (ref 8.4–10.5)
GFR calc Af Amer: 89 mL/min — ABNORMAL LOW (ref >90)
GFR calc non Af Amer: 77 mL/min — ABNORMAL LOW (ref >90)
Sodium: 132 mEq/L — ABNORMAL LOW (ref 135–145)

## 2012-07-09 LAB — CBC
Platelets: 344 10*3/uL (ref 150–400)
RBC: 3.78 MIL/uL — ABNORMAL LOW (ref 4.22–5.81)
WBC: 10.1 10*3/uL (ref 4.0–10.5)

## 2012-07-09 LAB — PROTIME-INR
INR: 2.71 — ABNORMAL HIGH (ref 0.00–1.49)
Prothrombin Time: 27.4 seconds — ABNORMAL HIGH (ref 11.6–15.2)

## 2012-07-09 LAB — VANCOMYCIN, TROUGH: Vancomycin Tr: 8.6 ug/mL — ABNORMAL LOW (ref 10.0–20.0)

## 2012-07-09 MED ORDER — WARFARIN SODIUM 2.5 MG PO TABS
2.5000 mg | ORAL_TABLET | Freq: Once | ORAL | Status: AC
  Administered 2012-07-09: 2.5 mg via ORAL
  Filled 2012-07-09: qty 1

## 2012-07-09 MED ORDER — VANCOMYCIN HCL IN DEXTROSE 1-5 GM/200ML-% IV SOLN
1000.0000 mg | Freq: Two times a day (BID) | INTRAVENOUS | Status: DC
  Administered 2012-07-09 – 2012-07-14 (×10): 1000 mg via INTRAVENOUS
  Filled 2012-07-09 (×11): qty 200

## 2012-07-09 MED ORDER — HYDROCORTISONE 1 % EX CREA
TOPICAL_CREAM | Freq: Three times a day (TID) | CUTANEOUS | Status: DC
  Administered 2012-07-09: 11:00:00 via TOPICAL
  Filled 2012-07-09: qty 28

## 2012-07-09 MED ORDER — HYDROCORTISONE 1 % EX CREA
TOPICAL_CREAM | Freq: Three times a day (TID) | CUTANEOUS | Status: DC | PRN
Start: 2012-07-09 — End: 2012-07-17
  Filled 2012-07-09: qty 28

## 2012-07-09 NOTE — Progress Notes (Addendum)
Subjective: Breathing better after breathing treatment.  SOme pain on R side where procedure was  Worse with inspiration.  Comp of itchy rash on back Objective: Filed Vitals:   07/08/12 1715 07/08/12 1934 07/09/12 0436 07/09/12 0627  BP: 114/60 109/51 100/50 108/60  Pulse: 78 74 70   Temp:  98 F (36.7 C) 98.1 F (36.7 C)   TempSrc:  Oral Oral   Resp:  18 18   Height:      Weight:   159 lb 2.8 oz (72.2 kg)   SpO2:  91% 92%    Weight change: -2 lb 10.3 oz (-1.2 kg)  Intake/Output Summary (Last 24 hours) at 07/09/12 1018 Last data filed at 07/09/12 0900  Gross per 24 hour  Intake   1160 ml  Output   2225 ml  Net  -1065 ml    General: Alert, awake, oriented x3, in no acute distress Neck:  JVP is normal Heart: Regular rate and rhythm, without murmurs, rubs, gallops.  Lungs: MOving air well Mild wheeze on L  Rhonchi on R   Back  Mild rash on back  Erythematous macules. Exemities:  No edema.   Neuro: Grossly intact, nonfocal.   Lab Results: Results for orders placed during the hospital encounter of 07/06/12 (from the past 24 hour(s))  PROTEIN, BODY FLUID     Status: None   Collection Time    07/08/12 11:06 AM      Result Value Range   Total protein, fluid 3.8     Fluid Type-FTP FLUID    BODY FLUID CELL COUNT WITH DIFFERENTIAL     Status: Abnormal   Collection Time    07/08/12 11:06 AM      Result Value Range   Fluid Type-FCT FLUID     Color, Fluid RED (*) YELLOW   Appearance, Fluid BLOODY (*) CLEAR   WBC, Fluid SPECIMEN CLOTTED  0 - 1000 cu mm   Neutrophil Count, Fluid 62 (*) 0 - 25 %   Lymphs, Fluid 14     Monocyte-Macrophage-Serous Fluid 23 (*) 50 - 90 %   Eos, Fluid 1    LACTATE DEHYDROGENASE, BODY FLUID     Status: Abnormal   Collection Time    07/08/12 11:06 AM      Result Value Range   LD, Fluid 1831 (*) 3 - 23 U/L   Fluid Type-FLDH FLUID    PROTIME-INR     Status: Abnormal   Collection Time    07/09/12  4:43 AM      Result Value Range   Prothrombin  Time 27.4 (*) 11.6 - 15.2 seconds   INR 2.71 (*) 0.00 - 1.49  BASIC METABOLIC PANEL     Status: Abnormal   Collection Time    07/09/12  4:43 AM      Result Value Range   Sodium 132 (*) 135 - 145 mEq/L   Potassium 3.7  3.5 - 5.1 mEq/L   Chloride 97  96 - 112 mEq/L   CO2 28  19 - 32 mEq/L   Glucose, Bld 141 (*) 70 - 99 mg/dL   BUN 17  6 - 23 mg/dL   Creatinine, Ser 8.29  0.50 - 1.35 mg/dL   Calcium 8.3 (*) 8.4 - 10.5 mg/dL   GFR calc non Af Amer 77 (*) >90 mL/min   GFR calc Af Amer 89 (*) >90 mL/min  CBC     Status: Abnormal   Collection Time    07/09/12  4:43  AM      Result Value Range   WBC 10.1  4.0 - 10.5 K/uL   RBC 3.78 (*) 4.22 - 5.81 MIL/uL   Hemoglobin 11.1 (*) 13.0 - 17.0 g/dL   HCT 16.1 (*) 09.6 - 04.5 %   MCV 82.8  78.0 - 100.0 fL   MCH 29.4  26.0 - 34.0 pg   MCHC 35.5  30.0 - 36.0 g/dL   RDW 40.9  81.1 - 91.4 %   Platelets 344  150 - 400 K/uL    Studies/Results: @RISRSLT24 @  Medications: Reviewed    @PROBHOSP @  1  PNA  S/p thoracentesis.  COntinues on ABX 2.  CM  Mild LV dysfunction LVEF 45 to 50%  Volume status looks OK 3.  CAD  No symptoms of angina 4.  Afib  Continue amio and coumadin. 5.  Sinus node dysfunction  S/P recent revision of PPM 6.  Rash  Appears to be heat rash on back from laying Told him to get up today more.  Prednisone cream as needed Does not appear to be drug rash.    LOS: 3 days   Dietrich Pates 07/09/2012, 10:18 AM

## 2012-07-09 NOTE — Progress Notes (Signed)
ANTIBIOTIC CONSULT NOTE - follow up  Pharmacy Consult for Vancomycin Indication: pneumonia  ANTICOAGULATION CONSULT NOTE - Initial Consult  Pharmacy Consult for Coumadin Indication:  H/o Atrial fibrillation  Allergies  Allergen Reactions  . Antihistamines, Diphenhydramine-Type Other (See Comments)    Causes difficulty in ability to urinate.    Patient Measurements: Height: 5\' 7"  (170.2 cm) Weight: 159 lb 2.8 oz (72.2 kg) IBW/kg (Calculated) : 66.1   Vital Signs: Temp: 99 F (37.2 C) (05/03 1337) Temp src: Oral (05/03 1337) BP: 116/53 mmHg (05/03 1337) Pulse Rate: 69 (05/03 1337) Intake/Output from previous day: 05/02 0701 - 05/03 0700 In: 1160 [P.O.:960; IV Piggyback:200] Out: 1825 [Urine:1825] Intake/Output from this shift: Total I/O In: 240 [P.O.:240] Out: 600 [Urine:600]  Labs:  Recent Labs  07/04/12 0807 07/06/12 0750  HGB  --  12.8*  HCT  --  36.6*  PLT  --  372  INR 2.6  --   CREATININE  --  0.98    Recent Labs  07/07/12 0425 07/08/12 0515 07/09/12 0443  WBC 13.6* 12.2* 10.1  HGB 11.9* 11.5* 11.1*  PLT 338 334 344  CREATININE 0.95 0.86 0.92   Estimated Creatinine Clearance: 58.9 ml/min (by C-G formula based on Cr of 0.92).  Recent Labs  07/09/12 1443  VANCOTROUGH 8.6*     Microbiology: Recent Results (from the past 720 hour(s))  SURGICAL PCR SCREEN     Status: None   Collection Time    06/15/12  9:13 AM      Result Value Range Status   MRSA, PCR NEGATIVE  NEGATIVE Final   Staphylococcus aureus NEGATIVE  NEGATIVE Final   Comment:            The Xpert SA Assay (FDA     approved for NASAL specimens     in patients over 62 years of age),     is one component of     a comprehensive surveillance     program.  Test performance has     been validated by [employer] for patients greater     than or equal to 29 year old.     It is not intended     to diagnose infection nor to     guide or monitor treatment.  MRSA PCR SCREENING      Status: None   Collection Time    06/15/12  8:18 PM      Result Value Range Status   MRSA by PCR NEGATIVE  NEGATIVE Final   Comment:            The GeneXpert MRSA Assay (FDA     approved for NASAL specimens     only), is one component of a     comprehensive MRSA colonization     surveillance program. It is not     intended to diagnose MRSA     infection nor to guide or     monitor treatment for     MRSA infections.  CULTURE, BLOOD (ROUTINE X 2)     Status: None   Collection Time    07/06/12 11:00 AM      Result Value Range Status   Specimen Description BLOOD RIGHT ANTECUBITAL   Final   Special Requests BOTTLES DRAWN AEROBIC ONLY 5CC   Final   Culture  Setup Time 07/06/2012 16:16   Final   Culture     Final   Value:  BLOOD CULTURE RECEIVED NO GROWTH TO DATE CULTURE WILL BE HELD FOR 5 DAYS BEFORE ISSUING A FINAL NEGATIVE REPORT   Report Status PENDING   Incomplete  CULTURE, BLOOD (ROUTINE X 2)     Status: None   Collection Time    07/06/12 11:15 AM      Result Value Range Status   Specimen Description BLOOD HAND RIGHT   Final   Special Requests BOTTLES DRAWN AEROBIC ONLY 5CC   Final   Culture  Setup Time 07/06/2012 16:15   Final   Culture     Final   Value:        BLOOD CULTURE RECEIVED NO GROWTH TO DATE CULTURE WILL BE HELD FOR 5 DAYS BEFORE ISSUING A FINAL NEGATIVE REPORT   Report Status PENDING   Incomplete  BODY FLUID CULTURE     Status: None   Collection Time    07/08/12 11:06 AM      Result Value Range Status   Specimen Description FLUID RIGHT PLEURAL   Final   Special Requests Normal   Final   Gram Stain PENDING   Incomplete   Culture NO GROWTH 1 DAY   Final   Report Status PENDING   Incomplete    Medical History: Past Medical History  Diagnosis Date  . Coronary artery disease   . Cardiomyopathy primary-nonischemic EF 45%  . Hyperlipidemia   . GERD (gastroesophageal reflux disease)   . Polymyalgia rheumatica   . Sick sinus syndrome   . Atrial  fibrillation   . Dysphagia   . Hearing loss, mixed, bilateral   . Increased prostate specific antigen (PSA) velocity   . Lumbar back pain   . BPH (benign prostatic hypertrophy)   . Breast mass     "on both sides" (06/28/2012)  . CHF (congestive heart failure)   . Elevated liver enzymes   . Esophageal stricture   . Pacemaker   . Arthritis     "some" (06/28/2012)  . Pacemaker infection     06/28/12    Medications:  Prescriptions prior to admission  Medication Sig Dispense Refill  . acetaminophen (TYLENOL) 500 MG tablet Take 500 mg by mouth daily as needed for pain.      Marland Kitchen amiodarone (PACERONE) 200 MG tablet Take 1 tablet (200 mg total) by mouth daily.  90 tablet  3  . carvedilol (COREG) 12.5 MG tablet Take 12.5 mg by mouth 2 (two) times daily with a meal.      . digoxin (LANOXIN) 0.25 MG tablet Take 0.5 tablets (125 mcg total) by mouth daily.  90 tablet  3  . finasteride (PROSCAR) 5 MG tablet Take 5 mg by mouth 4 (four) times a week. Monday, Tuesday, Thursday, Saturday      . fluticasone (FLONASE) 50 MCG/ACT nasal spray Place 2 sprays into the nose daily.  16 g  3  . magnesium oxide (MAG-OX) 400 MG tablet Take 400 mg by mouth daily.      . Multiple Vitamins-Minerals (MULTIVITAMIN WITH MINERALS) tablet Take 1 tablet by mouth daily.        . pantoprazole (PROTONIX) 40 MG tablet Take 40 mg by mouth 4 (four) times a week. Monday, Tuesday, Thursday and Saturday      . pravastatin (PRAVACHOL) 80 MG tablet Take 1 tablet (80 mg total) by mouth daily.  90 tablet  3  . warfarin (COUMADIN) 5 MG tablet Take 2.5-5 mg by mouth daily. 1 tablet (5 mg) on Monday, Wednesday and Friday, 1/2  tablet (2.5 mg) on all other days       Scheduled:  . amiodarone  200 mg Oral Daily  . benzonatate  100 mg Oral TID  . carvedilol  12.5 mg Oral BID WC  . digoxin  125 mcg Oral Daily  . docusate sodium  100 mg Oral BID  . finasteride  5 mg Oral 4 times weekly  . fluticasone  2 spray Each Nare Daily  . magnesium  oxide  400 mg Oral Daily  . pantoprazole  40 mg Oral 4 times weekly  . piperacillin-tazobactam (ZOSYN)  IV  3.375 g Intravenous Q8H  . pravastatin  80 mg Oral q1800  . sodium chloride  3 mL Intravenous Q12H  . vancomycin  750 mg Intravenous Q12H  . warfarin  2.5 mg Oral ONCE-1800  . [COMPLETED] warfarin  5 mg Oral ONCE-1800  . Warfarin - Pharmacist Dosing Inpatient   Does not apply q1800  . [DISCONTINUED] hydrocortisone cream   Topical TID   Assessment: Vancomycin trough = 8.6 mcg/ml on 750 mg IV q12h for PNA in this 79 y.o male. Trough is below goal of 15-20 mcg/ml.  Continues on IV Vanc/Zosyn.   SCr stable 0.92, CrCl ~ 59 ml/min. Good UOP,  I/O = 1040/1825.    Goal of Therapy:  Vancomycin trough level 15-20 mcg/ml INR 2-3 Monitor platelets by anticoagulation protocol: Yes  Plan:  Increase vancomycin to 1000 mg IV q12h Monitor renal function, clinical status and culture results.   Noah Delaine, RPh Clinical Pharmacist Pager: [phone] 07/09/2012,3:52 PM

## 2012-07-09 NOTE — Progress Notes (Signed)
[facility] PROGRESS NOTE  [name] ZOX:[id number] DOB: Apr 17, 1929 DOA: 07/06/2012 PCP: [name], MD  Assessment/Plan: 1. HCAP.  Improving on broad spectrum antibiotics.  Blood cultures have shown no growth. He has not had any fevers and leukocytosis is improving. Can likely transition to po antibiotics within next 24 hours. Will try and follow up pleural fluid cultures 2. Right sided pleural effusion, parapneumonic. 1.5L of fluid was aspirated.  Follow up culture 3. Recent pacemaker infection.  Patient had pacemaker extraction done and replacement with new device in early April 2014.  This was done due to localized infection around pacemaker.  There does not appear to be any ongoing issues with this. 4. NICM.  Appears to be compensated.  Continue current meds.  Cardiology following. 5. Chronic A fib. On amiodarone, digoxin, coreg and anticoagulation.  Code Status: full code Family Communication: discussed with patient and wife at the bedside Disposition Plan: discharge home, likely tomorrow   Consultants:  [facility] Cardiology  Interventional Radiology  Procedures:  Thoracentesis 5/2 with removal of 1.5L of fluid  Antibiotics:  Vancomycin 4/30  Cefepime 4/30 - 5/2  Zosyn 5/2  HPI/Subjective: Feeling better today.  Has cough productive of clear sputum, has pleuritic chest pain at thoracentesis site, had some shortness of breath this morning which improved after neb treatment.  Objective: Filed Vitals:   07/08/12 1715 07/08/12 1934 07/09/12 0436 07/09/12 0627  BP: 114/60 109/51 100/50 108/60  Pulse: 78 74 70   Temp:  98 F (36.7 C) 98.1 F (36.7 C)   TempSrc:  Oral Oral   Resp:  18 18   Height:      Weight:   72.2 kg (159 lb 2.8 oz)   SpO2:  91% 92%     Intake/Output Summary (Last 24 hours) at 07/09/12 1055 Last data filed at 07/09/12 0900  Gross per 24 hour  Intake   1160 ml  Output   2225 ml  Net  -1065 ml   Filed Weights   07/07/12  0556 07/08/12 0529 07/09/12 0436  Weight: 74.8 kg (164 lb 14.5 oz) 73.4 kg (161 lb 13.1 oz) 72.2 kg (159 lb 2.8 oz)    Exam:   General:  NAD  Cardiovascular: s1, s2, RRR  Respiratory: rhonchi and diminished breath sounds on at right base  Abdomen: soft, nt, nd, bs+  Musculoskeletal: no pedal edema   Data Reviewed: Basic Metabolic Panel:  Recent Labs Lab 07/06/12 0750 07/07/12 0425 07/08/12 0515 07/09/12 0443  NA 134* 134* 132* 132*  K 4.4 4.2 3.9 3.7  CL 99 100 97 97  CO2 25 27 28 28   GLUCOSE 118* 138* 139* 141*  BUN 15 17 16 17   CREATININE 0.98 0.95 0.86 0.92  CALCIUM 9.3 8.9 8.6 8.3*   Liver Function Tests:  Recent Labs Lab 07/06/12 0750 07/07/12 0425  AST 62* 32  ALT 61* 41  ALKPHOS 72 63  BILITOT 1.0 0.7  PROT 7.1 6.3  ALBUMIN 3.0* 2.6*   No results found for this basename: LIPASE, AMYLASE,  in the last 168 hours No results found for this basename: AMMONIA,  in the last 168 hours CBC:  Recent Labs Lab 07/06/12 0750 07/07/12 0425 07/08/12 0515 07/09/12 0443  WBC 13.0* 13.6* 12.2* 10.1  NEUTROABS 9.0*  --   --   --   HGB 12.8* 11.9* 11.5* 11.1*  HCT 36.6* 34.2* 32.9* 31.3*  MCV 83.8 84.0 83.1 82.8  PLT 372 338 334 344   Cardiac Enzymes:  No results found for this basename: CKTOTAL, CKMB, CKMBINDEX, TROPONINI,  in the last 168 hours BNP (last 3 results)  Recent Labs  07/07/12 0425 07/08/12 0902  PROBNP 842.7* 727.3*   CBG:  Recent Labs Lab 07/07/12 0558  GLUCAP 116*    Recent Results (from the past 240 hour(s))  CULTURE, BLOOD (ROUTINE X 2)     Status: None   Collection Time    07/06/12 11:00 AM      Result Value Range Status   Specimen Description BLOOD RIGHT ANTECUBITAL   Final   Special Requests BOTTLES DRAWN AEROBIC ONLY 5CC   Final   Culture  Setup Time 07/06/2012 16:16   Final   Culture     Final   Value:        BLOOD CULTURE RECEIVED NO GROWTH TO DATE CULTURE WILL BE HELD FOR 5 DAYS BEFORE ISSUING A FINAL NEGATIVE  REPORT   Report Status PENDING   Incomplete  CULTURE, BLOOD (ROUTINE X 2)     Status: None   Collection Time    07/06/12 11:15 AM      Result Value Range Status   Specimen Description BLOOD HAND RIGHT   Final   Special Requests BOTTLES DRAWN AEROBIC ONLY 5CC   Final   Culture  Setup Time 07/06/2012 16:15   Final   Culture     Final   Value:        BLOOD CULTURE RECEIVED NO GROWTH TO DATE CULTURE WILL BE HELD FOR 5 DAYS BEFORE ISSUING A FINAL NEGATIVE REPORT   Report Status PENDING   Incomplete     Studies: Dg Chest 1 View  07/08/2012  *RADIOLOGY REPORT*  Clinical Data: Post right thoracentesis.  Cough.  CHEST - 1 VIEW  Comparison: 07/06/2012.  Findings: Trachea is midline.  Heart size stable.  Pacemaker lead tips are stable in position.  There is a moderate right pleural effusion, not significantly changed in size from 07/06/2012. Associated right basilar airspace consolidation.  No pneumothorax. Left lung is clear.  IMPRESSION:  1.  No pneumothorax after right thoracentesis. 2.  Minimal change in size of a moderate right pleural effusion with right basilar airspace consolidation.  Follow-up to clearing is recommended.   Original Report Authenticated By: Leanna Battles, M.D.    US Thoracentesis Asp Pleural Space W/img Guide  07/08/2012  *RADIOLOGY REPORT*  Clinical Data:  Right pleural effusion  ULTRASOUND GUIDED right THORACENTESIS  Comparison:  None  An ultrasound guided thoracentesis was thoroughly discussed with the patient and questions answered.  The benefits, risks, alternatives and complications were also discussed.  The patient understands and wishes to proceed with the procedure.  Written consent was obtained.  Ultrasound was performed to localize and mark an adequate pocket of fluid in the right chest.  The area was then prepped and draped in the normal sterile fashion.  1% Lidocaine was used for local anesthesia.  Under ultrasound guidance a 19 gauge Yueh catheter was introduced.   Thoracentesis was performed.  The catheter was removed and a dressing applied.  Complications:  None  Findings: A total of approximately 1.5 liters of bloody fluid was removed. A fluid sample was sent for laboratory analysis.  IMPRESSION: Successful ultrasound guided right thoracentesis yielding 1.5 liters of pleural fluid.  Read by: Ralene Muskrat, P.A.-C   Original Report Authenticated By: [name]. Shick, M.D.     Scheduled Meds: . amiodarone  200 mg Oral Daily  . benzonatate  100 mg Oral TID  .  carvedilol  12.5 mg Oral BID WC  . digoxin  125 mcg Oral Daily  . docusate sodium  100 mg Oral BID  . finasteride  5 mg Oral 4 times weekly  . fluticasone  2 spray Each Nare Daily  . magnesium oxide  400 mg Oral Daily  . pantoprazole  40 mg Oral 4 times weekly  . piperacillin-tazobactam (ZOSYN)  IV  3.375 g Intravenous Q8H  . pravastatin  80 mg Oral q1800  . sodium chloride  3 mL Intravenous Q12H  . vancomycin  750 mg Intravenous Q12H  . Warfarin - Pharmacist Dosing Inpatient   Does not apply q1800   Continuous Infusions:   Principal Problem:   Pleuritic chest pain Active Problems:   Atrial fibrillation   SICK SINUS SYNDROME   Nonischemic cardiomyopathy   HCAP (healthcare-associated pneumonia)   Pleural effusion on right   Pacemaker infection   Transaminitis    Time spent:    MEMON,JEHANZEB  [facility] Pager [phone]. If 7PM-7AM, please contact night-coverage at [email or web address], password [facility] 07/09/2012, 10:55 AM  LOS: 3 days

## 2012-07-09 NOTE — Progress Notes (Signed)
ANTICOAGULATION CONSULT NOTE - Follow Up Consult  Pharmacy Consult:  Coumadin Indication:  History of AFib  Allergies  Allergen Reactions  . Antihistamines, Diphenhydramine-Type Other (See Comments)    Causes difficulty in ability to urinate.    Patient Measurements: Height: 5\' 7"  (170.2 cm) Weight: 159 lb 2.8 oz (72.2 kg) IBW/kg (Calculated) : 66.1  Vital Signs: Temp: 98.1 F (36.7 C) (05/03 0436) Temp src: Oral (05/03 0436) BP: 108/60 mmHg (05/03 0627) Pulse Rate: 70 (05/03 0436)  Labs:  Recent Labs  07/07/12 0425 07/08/12 0515 07/09/12 0443  HGB 11.9* 11.5* 11.1*  HCT 34.2* 32.9* 31.3*  PLT 338 334 344  LABPROT 28.7* 27.7* 27.4*  INR 2.88* 2.75* 2.71*  CREATININE 0.95 0.86 0.92    Estimated Creatinine Clearance: 58.9 ml/min (by C-G formula based on Cr of 0.92).       Assessment: 55 YOM with history of Afib to resume Coumadin.  Coumadin was held on 07/07/12 for thoracentesis on 07/09/11.   INR remains therapeutic, INR = 2.71.  CBC stable, no bleeding reported.   PTA dose of coumadin was 5 mg on qMWF and 2.5mg  qTTSS  Patient continues on vancomycin and Zosyn for PNA.  S/p thoracentesis for pleural effusion.  His renal function remains stable. Afebrile, WBC decreased to 10.1K.  Vancomycin steady state trough pending today.   Vanc 4/30 >> Cefepime 4/30 >> 5/2 Zosyn 5/2 >>  4/30 BC x 2 - NGTD    Goal of Therapy:  INR 2-3 Monitor platelets by anticoagulation protocol: Yes Vanc trough 15 - 20 mcg/mL    Plan:  - Coumadin 2.5mg  PO today (his ususal PTA dosage) - Daily PT / INR -Follow up vanc trough today     Noah Delaine, RPh Clinical Pharmacist Pager: [phone]  07/09/2012, 1:10 PM

## 2012-07-10 ENCOUNTER — Inpatient Hospital Stay (HOSPITAL_COMMUNITY): Payer: Medicare Other

## 2012-07-10 LAB — CBC
MCHC: 34 g/dL (ref 30.0–36.0)
Platelets: 395 10*3/uL (ref 150–400)
RDW: 14.4 % (ref 11.5–15.5)
WBC: 11.5 10*3/uL — ABNORMAL HIGH (ref 4.0–10.5)

## 2012-07-10 LAB — PROTIME-INR: INR: 3.02 — ABNORMAL HIGH (ref 0.00–1.49)

## 2012-07-10 MED ORDER — WARFARIN SODIUM 2.5 MG PO TABS
2.5000 mg | ORAL_TABLET | Freq: Once | ORAL | Status: AC
  Administered 2012-07-10: 2.5 mg via ORAL
  Filled 2012-07-10: qty 1

## 2012-07-10 NOTE — Progress Notes (Signed)
ANTICOAGULATION CONSULT NOTE - Follow Up Consult  Pharmacy Consult:  Coumadin Indication:  History of AFib  Allergies  Allergen Reactions  . Antihistamines, Diphenhydramine-Type Other (See Comments)    Causes difficulty in ability to urinate.    Patient Measurements: Height: 5\' 7"  (170.2 cm) Weight: 159 lb 3.2 oz (72.213 kg) IBW/kg (Calculated) : 66.1  Vital Signs: Temp: 98.7 F (37.1 C) (05/04 0450) Temp src: Oral (05/04 0450) BP: 119/61 mmHg (05/04 0450) Pulse Rate: 74 (05/04 0450)  Labs:  Recent Labs  07/08/12 0515 07/09/12 0443 07/10/12 0518  HGB 11.5* 11.1* 11.1*  HCT 32.9* 31.3* 32.6*  PLT 334 344 395  LABPROT 27.7* 27.4* 29.7*  INR 2.75* 2.71* 3.02*  CREATININE 0.86 0.92  --     Estimated Creatinine Clearance: 58.9 ml/min (by C-G formula based on Cr of 0.92).   Assessment: 48 YOM with history of chronic afib continuing on coumadin.  Coumadin was held on 07/07/12 for thoracentesis on 07/09/11.  Today the INR remains therapeutic, INR = 3.02, increased from 2.71.  CBC stable, no bleeding reported.   PTA dose of coumadin was 5 mg on qMWF and 2.5mg  qTTSS. Continues on amiodarone 200mg  po daily as on PTA. Currently on IV antibiotics vancomycin / zosyn.    Goal of Therapy:  INR 2-3 Monitor platelets by anticoagulation protocol: Yes Vanc trough 15 - 20 mcg/mL    Plan:  - Stay with coumadin 2.5mg  PO today (his ususal PTA dosage) - Daily PT / INR   Noah Delaine, RPh Clinical Pharmacist Pager: [phone] 07/10/2012, 1:05 PM

## 2012-07-10 NOTE — Progress Notes (Signed)
[facility] PROGRESS NOTE  [name] ZOX:[id number] DOB: Dec 12, 1929 DOA: 07/06/2012 PCP: [name], MD  Assessment/Plan: 1. HCAP.  Improving on broad spectrum antibiotics.  Blood cultures have shown no growth. He has not had any fevers.  He does feel more short of breath today.  WBC count is stable to slightly increased at 11.5.  Will get repeat chest xray today to evaluate for recurring effusion.  Continue current antibiotics.  Discussed with ID and can likely transition to oral augmentin to complete the course.  Will keep on current IV antibiotics for now until we re evaluate chest xray to check for worsening infection/effusion. 2. Right sided pleural effusion, parapneumonic. 1.5L of fluid was aspirated.  Culture has shown no significant growth yet. 3. Recent pacemaker infection.  Patient had pacemaker extraction done and replacement with new device in early April 2014.  This was done due to localized infection around pacemaker.  There does not appear to be any ongoing issues with this. 4. NICM.  Appears to be compensated.  Continue current meds.  Cardiology following. 5. Chronic A fib. On amiodarone, digoxin, coreg and anticoagulation.  Code Status: full code Family Communication: discussed with patient and wife at the bedside Disposition Plan: discharge home when improved   Consultants:  [facility]  Interventional Radiology  Procedures:  Thoracentesis 5/2 with removal of 1.5L of fluid  Antibiotics:  Vancomycin 4/30  Cefepime 4/30 - 5/2  Zosyn 5/2  HPI/Subjective: Does not feel as well today.  Feels more short of breath.  Increasing pain in right side of chest at thoracentesis site.  Worse with deep breath.  Objective: Filed Vitals:   07/09/12 1946 07/09/12 2006 07/10/12 0450 07/10/12 0633  BP: 110/53  119/61   Pulse: 65  74   Temp: 99.8 F (37.7 C)  98.7 F (37.1 C)   TempSrc: Oral  Oral   Resp: 18  18   Height:      Weight:   72.213  kg (159 lb 3.2 oz)   SpO2: 91% 90% 92% 95%    Intake/Output Summary (Last 24 hours) at 07/10/12 1254 Last data filed at 07/10/12 1000  Gross per 24 hour  Intake   1020 ml  Output   1050 ml  Net    -30 ml   Filed Weights   07/08/12 0529 07/09/12 0436 07/10/12 0450  Weight: 73.4 kg (161 lb 13.1 oz) 72.2 kg (159 lb 2.8 oz) 72.213 kg (159 lb 3.2 oz)    Exam:   General:  NAD  Cardiovascular: s1, s2, RRR  Respiratory: rhonchi and diminished breath sounds on at right base  Abdomen: soft, nt, nd, bs+  Musculoskeletal: no pedal edema   Data Reviewed: Basic Metabolic Panel:  Recent Labs Lab 07/06/12 0750 07/07/12 0425 07/08/12 0515 07/09/12 0443  NA 134* 134* 132* 132*  K 4.4 4.2 3.9 3.7  CL 99 100 97 97  CO2 25 27 28 28   GLUCOSE 118* 138* 139* 141*  BUN 15 17 16 17   CREATININE 0.98 0.95 0.86 0.92  CALCIUM 9.3 8.9 8.6 8.3*   Liver Function Tests:  Recent Labs Lab 07/06/12 0750 07/07/12 0425  AST 62* 32  ALT 61* 41  ALKPHOS 72 63  BILITOT 1.0 0.7  PROT 7.1 6.3  ALBUMIN 3.0* 2.6*   No results found for this basename: LIPASE, AMYLASE,  in the last 168 hours No results found for this basename: AMMONIA,  in the last 168 hours CBC:  Recent Labs Lab 07/06/12  0750 07/07/12 0425 07/08/12 0515 07/09/12 0443 07/10/12 0518  WBC 13.0* 13.6* 12.2* 10.1 11.5*  NEUTROABS 9.0*  --   --   --   --   HGB 12.8* 11.9* 11.5* 11.1* 11.1*  HCT 36.6* 34.2* 32.9* 31.3* 32.6*  MCV 83.8 84.0 83.1 82.8 82.7  PLT 372 338 334 344 395   Cardiac Enzymes: No results found for this basename: CKTOTAL, CKMB, CKMBINDEX, TROPONINI,  in the last 168 hours BNP (last 3 results)  Recent Labs  07/07/12 0425 07/08/12 0902  PROBNP 842.7* 727.3*   CBG:  Recent Labs Lab 07/07/12 0558  GLUCAP 116*    Recent Results (from the past 240 hour(s))  CULTURE, BLOOD (ROUTINE X 2)     Status: None   Collection Time    07/06/12 11:00 AM      Result Value Range Status   Specimen  Description BLOOD RIGHT ANTECUBITAL   Final   Special Requests BOTTLES DRAWN AEROBIC ONLY 5CC   Final   Culture  Setup Time 07/06/2012 16:16   Final   Culture     Final   Value:        BLOOD CULTURE RECEIVED NO GROWTH TO DATE CULTURE WILL BE HELD FOR 5 DAYS BEFORE ISSUING A FINAL NEGATIVE REPORT   Report Status PENDING   Incomplete  CULTURE, BLOOD (ROUTINE X 2)     Status: None   Collection Time    07/06/12 11:15 AM      Result Value Range Status   Specimen Description BLOOD HAND RIGHT   Final   Special Requests BOTTLES DRAWN AEROBIC ONLY 5CC   Final   Culture  Setup Time 07/06/2012 16:15   Final   Culture     Final   Value:        BLOOD CULTURE RECEIVED NO GROWTH TO DATE CULTURE WILL BE HELD FOR 5 DAYS BEFORE ISSUING A FINAL NEGATIVE REPORT   Report Status PENDING   Incomplete  BODY FLUID CULTURE     Status: None   Collection Time    07/08/12 11:06 AM      Result Value Range Status   Specimen Description FLUID RIGHT PLEURAL   Final   Special Requests Normal   Final   Gram Stain     Final   Value: NO WBC SEEN     NO ORGANISMS SEEN   Culture NO GROWTH 1 DAY   Final   Report Status PENDING   Incomplete     Studies: No results found.  Scheduled Meds: . amiodarone  200 mg Oral Daily  . benzonatate  100 mg Oral TID  . carvedilol  12.5 mg Oral BID WC  . digoxin  125 mcg Oral Daily  . docusate sodium  100 mg Oral BID  . finasteride  5 mg Oral 4 times weekly  . fluticasone  2 spray Each Nare Daily  . magnesium oxide  400 mg Oral Daily  . pantoprazole  40 mg Oral 4 times weekly  . piperacillin-tazobactam (ZOSYN)  IV  3.375 g Intravenous Q8H  . pravastatin  80 mg Oral q1800  . sodium chloride  3 mL Intravenous Q12H  . vancomycin  1,000 mg Intravenous Q12H  . Warfarin - Pharmacist Dosing Inpatient   Does not apply q1800   Continuous Infusions:   Principal Problem:   Pleuritic chest pain Active Problems:   Atrial fibrillation   SICK SINUS SYNDROME   Nonischemic  cardiomyopathy   HCAP (healthcare-associated pneumonia)  Pleural effusion on right   Pacemaker infection   Transaminitis    Time spent:    [name],Craig  [facility] Pager [phone]. If 7PM-7AM, please contact night-coverage at [email or web address], password [facility] 07/10/2012, 12:54 PM  LOS: 4 days

## 2012-07-11 LAB — BODY FLUID CULTURE: Gram Stain: NONE SEEN

## 2012-07-11 LAB — BASIC METABOLIC PANEL
Chloride: 100 mEq/L (ref 96–112)
Creatinine, Ser: 0.89 mg/dL (ref 0.50–1.35)
GFR calc Af Amer: >90 mL/min (ref >90)
GFR calc non Af Amer: 78 mL/min — ABNORMAL LOW (ref >90)
Potassium: 4 mEq/L (ref 3.5–5.1)

## 2012-07-11 LAB — PATHOLOGIST SMEAR REVIEW

## 2012-07-11 LAB — CBC
MCHC: 34.9 g/dL (ref 30.0–36.0)
MCV: 82.4 fL (ref 78.0–100.0)
Platelets: 382 10*3/uL (ref 150–400)
RDW: 14.4 % (ref 11.5–15.5)
WBC: 11.6 10*3/uL — ABNORMAL HIGH (ref 4.0–10.5)

## 2012-07-11 LAB — PROTIME-INR: INR: 3.06 — ABNORMAL HIGH (ref 0.00–1.49)

## 2012-07-11 MED ORDER — WARFARIN SODIUM 1 MG PO TABS
1.0000 mg | ORAL_TABLET | Freq: Once | ORAL | Status: AC
  Administered 2012-07-11: 1 mg via ORAL
  Filled 2012-07-11: qty 1

## 2012-07-11 MED ORDER — FUROSEMIDE 20 MG PO TABS
20.0000 mg | ORAL_TABLET | Freq: Once | ORAL | Status: AC
  Administered 2012-07-11: 20 mg via ORAL
  Filled 2012-07-11: qty 1

## 2012-07-11 NOTE — Progress Notes (Signed)
Subjective: The patient says that at times he feels great and then at other times he has right-sided pleuritic chest pain and shortness of breath. He ambulated earlier with the nursing staff more than 300 feet. He became winded, but was able to tolerate ambulation. Currently, he is lying in bed without any new complaints.  Objective: Vital signs in last 24 hours: Filed Vitals:   07/11/12 1050 07/11/12 1354 07/11/12 1415 07/11/12 1656  BP:  132/48  124/52  Pulse: 74 90  86  Temp:  98.4 F (36.9 C)    TempSrc:  Oral    Resp:  20    Height:      Weight:      SpO2:  92% 96%     Intake/Output Summary (Last 24 hours) at 07/11/12 1736 Last data filed at 07/11/12 1354  Gross per 24 hour  Intake    480 ml  Output   1500 ml  Net  -1020 ml    Weight change: 0.59 kg (1 lb 4.8 oz)  Physical exam: General: Pleasant 79 year old Caucasian man in no acute distress Lungs: Decreased breath sounds on the right, but overall better air movement and clear on the left. Breathing not as labored when speaking. Heart: Irregular, irregular. Abdomen: Positive bowel sounds, soft, nontender, nondistended. Extremities: No pedal edema.  Lab Results: Basic Metabolic Panel:  Recent Labs  16/10/96 0443 07/11/12 0438  NA 132* 136  K 3.7 4.0  CL 97 100  CO2 28 27  GLUCOSE 141* 116*  BUN 17 14  CREATININE 0.92 0.89  CALCIUM 8.3* 8.5   Liver Function Tests: No results found for this basename: AST, ALT, ALKPHOS, BILITOT, PROT, ALBUMIN,  in the last 72 hours No results found for this basename: LIPASE, AMYLASE,  in the last 72 hours No results found for this basename: AMMONIA,  in the last 72 hours CBC:  Recent Labs  07/10/12 0518 07/11/12 0438  WBC 11.5* 11.6*  HGB 11.1* 11.1*  HCT 32.6* 31.8*  MCV 82.7 82.4  PLT 395 382   Cardiac Enzymes: No results found for this basename: CKTOTAL, CKMB, CKMBINDEX, TROPONINI,  in the last 72 hours BNP: No results found for this basename: PROBNP,  in the  last 72 hours D-Dimer: No results found for this basename: DDIMER,  in the last 72 hours CBG: No results found for this basename: GLUCAP,  in the last 72 hours Hemoglobin A1C: No results found for this basename: HGBA1C,  in the last 72 hours Fasting Lipid Panel: No results found for this basename: CHOL, HDL, LDLCALC, TRIG, CHOLHDL, LDLDIRECT,  in the last 72 hours Thyroid Function Tests: No results found for this basename: TSH, T4TOTAL, FREET4, T3FREE, THYROIDAB,  in the last 72 hours Anemia Panel: No results found for this basename: VITAMINB12, FOLATE, FERRITIN, TIBC, IRON, RETICCTPCT,  in the last 72 hours Coagulation:  Recent Labs  07/10/12 0518 07/11/12 0438  LABPROT 29.7* 30.0*  INR 3.02* 3.06*   Urine Drug Screen: Drugs of Abuse  No results found for this basename: labopia,  cocainscrnur,  labbenz,  amphetmu,  thcu,  labbarb    Alcohol Level: No results found for this basename: ETH,  in the last 72 hours Urinalysis: No results found for this basename: COLORURINE, APPERANCEUR, LABSPEC, PHURINE, GLUCOSEU, HGBUR, BILIRUBINUR, KETONESUR, PROTEINUR, UROBILINOGEN, NITRITE, LEUKOCYTESUR,  in the last 72 hours Misc. Labs:   Micro: Recent Results (from the past 240 hour(s))  CULTURE, BLOOD (ROUTINE X 2)     Status: None  Collection Time    07/06/12 11:00 AM      Result Value Range Status   Specimen Description BLOOD RIGHT ANTECUBITAL   Final   Special Requests BOTTLES DRAWN AEROBIC ONLY 5CC   Final   Culture  Setup Time 07/06/2012 16:16   Final   Culture     Final   Value:        BLOOD CULTURE RECEIVED NO GROWTH TO DATE CULTURE WILL BE HELD FOR 5 DAYS BEFORE ISSUING A FINAL NEGATIVE REPORT   Report Status PENDING   Incomplete  CULTURE, BLOOD (ROUTINE X 2)     Status: None   Collection Time    07/06/12 11:15 AM      Result Value Range Status   Specimen Description BLOOD HAND RIGHT   Final   Special Requests BOTTLES DRAWN AEROBIC ONLY 5CC   Final   Culture  Setup Time  07/06/2012 16:15   Final   Culture     Final   Value:        BLOOD CULTURE RECEIVED NO GROWTH TO DATE CULTURE WILL BE HELD FOR 5 DAYS BEFORE ISSUING A FINAL NEGATIVE REPORT   Report Status PENDING   Incomplete  BODY FLUID CULTURE     Status: None   Collection Time    07/08/12 11:06 AM      Result Value Range Status   Specimen Description FLUID RIGHT PLEURAL   Final   Special Requests Normal   Final   Gram Stain     Final   Value: NO WBC SEEN     NO ORGANISMS SEEN   Culture NO GROWTH 3 DAYS   Final   Report Status 07/11/2012 FINAL   Final    Studies/Results: Dg Chest 2 View  07/10/2012  *RADIOLOGY REPORT*  Clinical Data: Shortness of breath.  CHEST - 2 VIEW  Comparison: Jul 08, 2012.  Findings: Stable cardiomediastinal silhouette.  Left-sided pacemaker is unchanged in position.  Left lung is clear.  Moderate size right pleural effusion is again noted and unchanged compared to prior exam with probable underlying pneumonia or atelectasis.  IMPRESSION: No significant change involving moderate right pleural effusion with probable underlying pneumonia or atelectasis.   Original Report Authenticated By: Lupita Raider.,  M.D.     Medications:  Scheduled: . amiodarone  200 mg Oral Daily  . benzonatate  100 mg Oral TID  . carvedilol  12.5 mg Oral BID WC  . digoxin  125 mcg Oral Daily  . docusate sodium  100 mg Oral BID  . finasteride  5 mg Oral 4 times weekly  . fluticasone  2 spray Each Nare Daily  . magnesium oxide  400 mg Oral Daily  . pantoprazole  40 mg Oral 4 times weekly  . piperacillin-tazobactam (ZOSYN)  IV  3.375 g Intravenous Q8H  . pravastatin  80 mg Oral q1800  . sodium chloride  3 mL Intravenous Q12H  . vancomycin  1,000 mg Intravenous Q12H  . Warfarin - Pharmacist Dosing Inpatient   Does not apply q1800   Continuous:  ZOX:[id number], alum & mag hydroxide-simeth, HYDROcodone-acetaminophen, hydrocortisone cream, levalbuterol, morphine injection, ondansetron (ZOFRAN) IV,  ondansetron  Assessment: Principal Problem:   Pleuritic chest pain Active Problems:   HCAP (healthcare-associated pneumonia)   Pacemaker infection   Pleural effusion on right   Atrial fibrillation   SICK SINUS SYNDROME   Nonischemic cardiomyopathy   Transaminitis  1. Pleuritic chest pain secondary to pleural effusion and healthcare associated pneumonia. He  is afebrile. His white blood cell count is trending downward towards normal. He is still somewhat symptomatic, but this has been intermittent depending on the time of day. Strep pneumo urinary antigen is negative. Legionella antigen is negative. Ultrasound on 07/07/2012 revealed a moderate to large complex/septated right pleural effusion. Interventional radiology was consulted and performed a diagnostic and therapeutic thoracentesis on 07/08/12.  It yielded 1.5 L of bloody fluid. The fluid culture is negative to date.  Right-sided pleural effusion/complex with septations. Query parapneumonic. Status post thoracentesis on 07/08/12. Followup chest x-ray on 07/10/2012 revealed no significant change in moderate right pleural effusion with possible underlying pneumonia or atelectasis. I do not favor another thoracentesis given that the first one was relatively successful, and with subsequent thoracentesis, there is a chance of pneumothorax. Also, because he is anticoagulated, chance of hemorrhage also increases.  Transient hypoxia with ambulation. This will be monitored for the need for home oxygen. At rest, on room air, he is oxygenating in the 90-93% range.  Recent pacemaker infection, status post extraction of previous pacemaker and insertion of another. Followed by cardiologist Dr. Ladona Ridgel.  Nonischemic cardiomyopathy and hypertension. Currently stable. ProBNP is modestly elevated. Continue digoxin, Coreg, statin.  Chronic atrial fibrillation. Currently stable and controlled. Anticoagulated. Digoxin level in the therapeutic range. Continue  amiodarone, digoxin, and carvedilol  Mild hepatic transaminitis. Resolved. Possibly secondary to right-sided pleural effusion/pneumonia.   Plan:  1. Gave trial of Lasix 20 mg x1. 2. Continue supportive treatment and ambulation as needed and tolerated. 3. Will assess his oxygen saturation with ambulation and at rest tomorrow again to assess whether not he needs home oxygen.    LOS: 5 days   [name] 07/11/2012, 5:36 PM

## 2012-07-11 NOTE — Progress Notes (Signed)
ANTICOAGULATION CONSULT NOTE - Follow Up Consult  Pharmacy Consult:  Coumadin Indication:  History of AFib  Allergies  Allergen Reactions  . Antihistamines, Diphenhydramine-Type Other (See Comments)    Causes difficulty in ability to urinate.   Labs:  Recent Labs  07/09/12 0443 07/10/12 0518 07/11/12 0438  HGB 11.1* 11.1* 11.1*  HCT 31.3* 32.6* 31.8*  PLT 344 395 382  LABPROT 27.4* 29.7* 30.0*  INR 2.71* 3.02* 3.06*  CREATININE 0.92  --  0.89    Estimated Creatinine Clearance: 60.9 ml/min (by C-G formula based on Cr of 0.89).   Assessment: 89 YOM with history of chronic afib continuing on coumadin.  Coumadin was held on 07/07/12 for thoracentesis on 07/09/11.  Today the INR remains therapeutic, INR = 3.06.  CBC stable, no bleeding reported.    PTA dose of coumadin was 5 mg on qMWF and 2.5mg  qTTSS. Continues on amiodarone 200mg  po daily as on PTA. Currently on IV antibiotics vancomycin / zosyn.   Goal of Therapy:  INR 2-3 Monitor platelets by anticoagulation protocol: Yes  Plan:  1) Coumadin 1 mg po x 1 dose today 2) Daily INR  Thank you. Okey Regal, PharmD [phone]  07/11/2012, 8:32 AM

## 2012-07-11 NOTE — Progress Notes (Signed)
Pt ambulated 450 ft., steady gait, winded after 327ft. O2 sats decreased to 86% while ambulating. Will continue to monitor.

## 2012-07-12 ENCOUNTER — Encounter (HOSPITAL_COMMUNITY): Payer: Self-pay | Admitting: Radiology

## 2012-07-12 ENCOUNTER — Inpatient Hospital Stay (HOSPITAL_COMMUNITY): Payer: Medicare Other

## 2012-07-12 LAB — CULTURE, BLOOD (ROUTINE X 2)
Culture: NO GROWTH
Culture: NO GROWTH

## 2012-07-12 LAB — BASIC METABOLIC PANEL
BUN: 12 mg/dL (ref 6–23)
Calcium: 8.6 mg/dL (ref 8.4–10.5)
Chloride: 100 mEq/L (ref 96–112)
Creatinine, Ser: 0.93 mg/dL (ref 0.50–1.35)
GFR calc Af Amer: 89 mL/min — ABNORMAL LOW (ref >90)
GFR calc non Af Amer: 77 mL/min — ABNORMAL LOW (ref >90)

## 2012-07-12 LAB — CBC
HCT: 31.6 % — ABNORMAL LOW (ref 39.0–52.0)
MCHC: 35.4 g/dL (ref 30.0–36.0)
MCV: 81.7 fL (ref 78.0–100.0)
Platelets: 451 10*3/uL — ABNORMAL HIGH (ref 150–400)
RDW: 14.3 % (ref 11.5–15.5)
WBC: 13 10*3/uL — ABNORMAL HIGH (ref 4.0–10.5)

## 2012-07-12 MED ORDER — BENZONATATE 100 MG PO CAPS: 100.0000 mg | ORAL_CAPSULE | Freq: Three times a day (TID) | ORAL | Status: DC | PRN

## 2012-07-12 MED ORDER — WARFARIN SODIUM 1 MG PO TABS
1.0000 mg | ORAL_TABLET | Freq: Once | ORAL | Status: AC
  Administered 2012-07-12: 1 mg via ORAL
  Filled 2012-07-12: qty 1

## 2012-07-12 MED ORDER — ALBUTEROL SULFATE HFA 108 (90 BASE) MCG/ACT IN AERS: 2.0000 | INHALATION_SPRAY | Freq: Four times a day (QID) | RESPIRATORY_TRACT | Status: DC | PRN

## 2012-07-12 MED ORDER — WARFARIN SODIUM 5 MG PO TABS: 2.5000 mg | ORAL_TABLET | Freq: Every day | ORAL | Status: DC

## 2012-07-12 MED ORDER — FUROSEMIDE 20 MG PO TABS
20.0000 mg | ORAL_TABLET | Freq: Once | ORAL | Status: AC
  Administered 2012-07-12: 20 mg via ORAL
  Filled 2012-07-12: qty 1

## 2012-07-12 MED ORDER — IOHEXOL 300 MG/ML  SOLN
80.0000 mL | Freq: Once | INTRAMUSCULAR | Status: AC | PRN
  Administered 2012-07-12: 80 mL via INTRAVENOUS

## 2012-07-12 MED ORDER — AMOXICILLIN-POT CLAVULANATE 875-125 MG PO TABS: 1.0000 | ORAL_TABLET | Freq: Two times a day (BID) | ORAL | Status: DC

## 2012-07-12 MED ORDER — HYDROCODONE-ACETAMINOPHEN 5-325 MG PO TABS: 1.0000 | ORAL_TABLET | ORAL | Status: DC | PRN

## 2012-07-12 NOTE — Progress Notes (Signed)
ANTICOAGULATION CONSULT NOTE - Follow Up Consult  Pharmacy Consult:  Coumadin Indication:  History of AFib  Allergies  Allergen Reactions  . Antihistamines, Diphenhydramine-Type Other (See Comments)    Causes difficulty in ability to urinate.   Labs:  Recent Labs  07/10/12 0518 07/11/12 0438 07/12/12 0450  HGB 11.1* 11.1* 11.2*  HCT 32.6* 31.8* 31.6*  PLT 395 382 451*  LABPROT 29.7* 30.0* 29.6*  INR 3.02* 3.06* 3.01*  CREATININE  --  0.89 0.93    Estimated Creatinine Clearance: 58.2 ml/min (by C-G formula based on Cr of 0.93).   Assessment: 8 YOM with history of chronic afib continuing on coumadin.  Coumadin was held on 07/07/12 for thoracentesis on 07/09/11.  Today the INR remains therapeutic, INR = 3.01.  CBC stable, no bleeding reported.    PTA dose of coumadin was 5 mg on qMWF and 2.5mg  qTTSS. Continues on amiodarone 200mg  po daily as on PTA.   Also on vancomycin / zosyn  for concern on PNA.   Cefepime 4/30 >> 5/2 Vanc 4/30 >> Zosyn 5/2 >>  4/30 BC x 2 -neg 5/2 plerual fluid -neg   Goal of Therapy:  INR 2-3 Monitor platelets by anticoagulation protocol: Yes  Plan:  - Coumadin 1 mg po x 1 dose today - Daily INR -Consider d/c vancomycin as cultures are negative   Harland German, Pharm D 07/12/2012 9:07 AM

## 2012-07-12 NOTE — Progress Notes (Signed)
Subjective: The patient was seen earlier this morning. He says that he felt the best that he is felt. He had a good night's sleep. He continues to have some pleurisy on the right and shortness of breath with activity, but overall much improved. He denies a productive cough or bloody sputum.  Objective: Vital signs in last 24 hours: Filed Vitals:   07/12/12 0621 07/12/12 1323 07/12/12 1608 07/12/12 1820  BP: 124/66 122/56 137/54   Pulse: 72 69 81   Temp:  98.8 F (37.1 C) 100.1 F (37.8 C) 100.1 F (37.8 C)  TempSrc:  Oral Oral   Resp:  18 17   Height:      Weight:      SpO2:  93% 95%     Intake/Output Summary (Last 24 hours) at 07/12/12 1822 Last data filed at 07/12/12 1700  Gross per 24 hour  Intake   1260 ml  Output   2700 ml  Net  -1440 ml    Weight change: 0.045 kg (1.6 oz)  Physical exam: General: Pleasant 79 year old Caucasian man in no acute distress Lungs: Decreased breath sounds on the right with rare crackles. and clear on the left. Heart: Irregular, irregular. Abdomen: Positive bowel sounds, soft, nontender, nondistended. Extremities: No pedal edema. Neuro: He is alert and oriented x3. Cranial nerves II through XII are intact.  Lab Results: Basic Metabolic Panel:  Recent Labs  14/78/29 0438 07/12/12 0450  NA 136 137  K 4.0 4.1  CL 100 100  CO2 27 27  GLUCOSE 116* 125*  BUN 14 12  CREATININE 0.89 0.93  CALCIUM 8.5 8.6   Liver Function Tests: No results found for this basename: AST, ALT, ALKPHOS, BILITOT, PROT, ALBUMIN,  in the last 72 hours No results found for this basename: LIPASE, AMYLASE,  in the last 72 hours No results found for this basename: AMMONIA,  in the last 72 hours CBC:  Recent Labs  07/11/12 0438 07/12/12 0450  WBC 11.6* 13.0*  HGB 11.1* 11.2*  HCT 31.8* 31.6*  MCV 82.4 81.7  PLT 382 451*   Cardiac Enzymes: No results found for this basename: CKTOTAL, CKMB, CKMBINDEX, TROPONINI,  in the last 72 hours BNP: No results  found for this basename: PROBNP,  in the last 72 hours D-Dimer: No results found for this basename: DDIMER,  in the last 72 hours CBG: No results found for this basename: GLUCAP,  in the last 72 hours Hemoglobin A1C: No results found for this basename: HGBA1C,  in the last 72 hours Fasting Lipid Panel: No results found for this basename: CHOL, HDL, LDLCALC, TRIG, CHOLHDL, LDLDIRECT,  in the last 72 hours Thyroid Function Tests: No results found for this basename: TSH, T4TOTAL, FREET4, T3FREE, THYROIDAB,  in the last 72 hours Anemia Panel: No results found for this basename: VITAMINB12, FOLATE, FERRITIN, TIBC, IRON, RETICCTPCT,  in the last 72 hours Coagulation:  Recent Labs  07/11/12 0438 07/12/12 0450  LABPROT 30.0* 29.6*  INR 3.06* 3.01*   Urine Drug Screen: Drugs of Abuse  No results found for this basename: labopia,  cocainscrnur,  labbenz,  amphetmu,  thcu,  labbarb    Alcohol Level: No results found for this basename: ETH,  in the last 72 hours Urinalysis: No results found for this basename: COLORURINE, APPERANCEUR, LABSPEC, PHURINE, GLUCOSEU, HGBUR, BILIRUBINUR, KETONESUR, PROTEINUR, UROBILINOGEN, NITRITE, LEUKOCYTESUR,  in the last 72 hours Misc. Labs:   Micro: Recent Results (from the past 240 hour(s))  CULTURE, BLOOD (ROUTINE X 2)  Status: None   Collection Time    07/06/12 11:00 AM      Result Value Range Status   Specimen Description BLOOD RIGHT ANTECUBITAL   Final   Special Requests BOTTLES DRAWN AEROBIC ONLY 5CC   Final   Culture  Setup Time 07/06/2012 16:16   Final   Culture NO GROWTH 5 DAYS   Final   Report Status 07/12/2012 FINAL   Final  CULTURE, BLOOD (ROUTINE X 2)     Status: None   Collection Time    07/06/12 11:15 AM      Result Value Range Status   Specimen Description BLOOD HAND RIGHT   Final   Special Requests BOTTLES DRAWN AEROBIC ONLY 5CC   Final   Culture  Setup Time 07/06/2012 16:15   Final   Culture NO GROWTH 5 DAYS   Final   Report  Status 07/12/2012 FINAL   Final  BODY FLUID CULTURE     Status: None   Collection Time    07/08/12 11:06 AM      Result Value Range Status   Specimen Description FLUID RIGHT PLEURAL   Final   Special Requests Normal   Final   Gram Stain     Final   Value: NO WBC SEEN     NO ORGANISMS SEEN   Culture NO GROWTH 3 DAYS   Final   Report Status 07/11/2012 FINAL   Final    Studies/Results: Ct Chest W Contrast  07/12/2012  *RADIOLOGY REPORT*  Clinical Data: Evaluate for pneumonia and right-sided pleural effusion.  CT CHEST WITH CONTRAST  Technique:  Multidetector CT imaging of the chest was performed following the standard protocol during bolus administration of intravenous contrast.  Contrast: 80mL OMNIPAQUE IOHEXOL 300 MG/ML  SOLN  Comparison: No priors.  Chest x-ray 07/10/2012.  Findings:  Mediastinum: Heart size is normal. There is no significant pericardial fluid, thickening or pericardial calcification. There is atherosclerosis of the thoracic aorta, the great vessels of the mediastinum and the coronary arteries, including calcified atherosclerotic plaque in the left main, left anterior descending, left circumflex and right coronary arteries. No pathologically enlarged mediastinal or hilar lymph nodes. Esophagus is unremarkable in appearance.  A left-sided biventricular pacemaker is noted with lead tips terminating in the right atrial appendage, right ventricular apex and overlying the lateral wall of the left ventricle via the coronary sinus and coronary veins.  Lungs/Pleura: Large complex multilocular right-sided pleural effusion with multiple thin internal septations.  There is either high attenuation debris or enhancing material in the posterior aspect of the right hemithorax.  Extensive passive atelectasis in the right lung, predominately in the right lower lobe.  No definite consolidative airspace disease is identified at this time.  No definite suspicious appearing pulmonary nodule or mass is  noted. Left lung is well-aerated.  Trace left pleural effusion.  Upper Abdomen: Unremarkable.  Musculoskeletal: There are no aggressive appearing lytic or blastic lesions noted in the visualized portions of the skeleton.  IMPRESSION: 1.  There is a large complex multi locular right-sided pleural effusion.  Differential considerations include an empyema and a malignant pleural effusion.  Sampling of pleural fluid is recommended. 2.  No current airspace consolidation in the right lung identified at this time to suggest the presence of a pneumonia. 3.  Trace left pleural effusion. 4.  Atherosclerosis, including left main and three-vessel coronary artery disease. 5.  Additional incidental findings, as above.   Original Report Authenticated By: Trudie Reed, M.D.  Medications:  Scheduled: . amiodarone  200 mg Oral Daily  . benzonatate  100 mg Oral TID  . carvedilol  12.5 mg Oral BID WC  . digoxin  125 mcg Oral Daily  . docusate sodium  100 mg Oral BID  . finasteride  5 mg Oral 4 times weekly  . fluticasone  2 spray Each Nare Daily  . magnesium oxide  400 mg Oral Daily  . pantoprazole  40 mg Oral 4 times weekly  . piperacillin-tazobactam (ZOSYN)  IV  3.375 g Intravenous Q8H  . pravastatin  80 mg Oral q1800  . sodium chloride  3 mL Intravenous Q12H  . vancomycin  1,000 mg Intravenous Q12H  . Warfarin - Pharmacist Dosing Inpatient   Does not apply q1800   Continuous:  ZOX:[id number], alum & mag hydroxide-simeth, HYDROcodone-acetaminophen, hydrocortisone cream, levalbuterol, morphine injection, ondansetron (ZOFRAN) IV, ondansetron  Assessment: Principal Problem:   Pleuritic chest pain Active Problems:   HCAP (healthcare-associated pneumonia)   Pacemaker infection   Pleural effusion on right   Atrial fibrillation   SICK SINUS SYNDROME   Nonischemic cardiomyopathy   Transaminitis  Brief history/narrative. The patient was admitted on 07/06/2012 with pleuritic chest pain. He has  chronic atrial fibrillation and is chronically anticoagulated with Coumadin. He had a recent hospitalization from 422 2014 through 07/01/2012 for pacemaker system infection. This was complicated by presumed microperforation of the RA and RV leads requiring lead revision on 06/29/2012 and 06/30/2012 by Dr. Sharrell Ku. He was treated with antibiotics for approximately one week. When he presented on 07/06/2012 with pleuritic chest pain, his chest x-ray revealed increase in the size of right pleural effusion and right base infiltrate versus atelectasis. His white blood cell count was elevated at 13 and he was borderline febrile at 99.7. He was started on healthcare associated antibiotics. Ultrasound of his chest was ordered to assess the pleural effusion. When it revealed a large effusion, interventional radiologist performed a thoracentesis on 07/08/2012 yielding 1.5 L of bloody fluid. Fluid culture is negative to date. Fluid protein was 3.8 and fluid LDH was 1831.  1. Pleuritic chest pain secondary to pleural effusion and healthcare associated pneumonia. Query empyema versus malignant effusion. The patient is on hospital day #6 of IV antibiotics. He is currently on vancomycin and Zosyn. He is running a low-grade fever and his white blood cell count is trending upward. A CT of his chest was ordered today for further evaluation. It revealed a large complex multilocular right-sided pleural effusion with the differential diagnoses including an empyema and malignant pleural effusion. I have consulted pulmonology. I discussed the patient with Dr. Frederico Hamman. Per our discussion, it is likely that the patient will need a chest tube. Future fluid will need to be assessed for cytology. Fluid culture is negative to date. Strep pneumo urinary antigen is negative. Legionella antigen is negative.   Transient hypoxia with ambulation. This will be monitored for the need for home oxygen. At rest, on room air, he is oxygenating in the 90-93%  range.  Recent pacemaker infection, status post extraction of previous pacemaker and insertion of another. Followed by cardiologist Dr. Ladona Ridgel.  Nonischemic cardiomyopathy and hypertension. Currently stable. ProBNP is modestly elevated. He received Lasix 20 mg x2 orally empirically, but doubt decompensated heart failure. Continue digoxin, Coreg, statin.  Chronic atrial fibrillation. Currently stable and controlled. Anticoagulated. Digoxin level in the therapeutic range. Continue amiodarone, digoxin, and carvedilol  Mild hepatic transaminitis. Resolved.   Plan:  1. The patient was informed of  the findings of the CT scan. 2. Pulmonology consulted. Dr. Frederico Hamman was notified. Per our discussion, one of her colleagues will see the patient tomorrow. 3. Continue supportive treatment antibiotics for now.    LOS: 6 days   [name] 07/12/2012, 6:22 PM

## 2012-07-12 NOTE — Care Management Note (Unsigned)
    Page 1 of 1   07/12/2012     4:16:50 PM   CARE MANAGEMENT NOTE 07/12/2012  Patient:  [name], [name]   Account Number:  [id number]  Date Initiated:  07/12/2012  Documentation initiated by:  Makana Feigel  Subjective/Objective Assessment:   PT ADM ON 07/06/12 WITH PLEURAL EFFUSION, HCAP.  PTA, PT RESIDES AT HOME WITH SPOUSE.     Action/Plan:   WILL FOLLOW FOR HOME NEEDS.  PT WILL LIKELY NEED HOME OXYGEN, AS DESATS WITH AMBULATION.  WILL FOLLOW.   Anticipated DC Date:  07/13/2012   Anticipated DC Plan:  HOME W HOME HEALTH SERVICES      DC Planning Services  CM consult      Choice offered to / List presented to:             Status of service:  In process, will continue to follow Medicare Important Message given?   (If response is "NO", the following Medicare IM given date fields will be blank) Date Medicare IM given:   Date Additional Medicare IM given:    Discharge Disposition:    Per UR Regulation:  Reviewed for med. necessity/level of care/duration of stay  If discussed at Long Length of Stay Meetings, dates discussed:    Comments:

## 2012-07-13 ENCOUNTER — Inpatient Hospital Stay (HOSPITAL_COMMUNITY): Payer: Medicare Other

## 2012-07-13 DIAGNOSIS — J90 Pleural effusion, not elsewhere classified: Secondary | ICD-10-CM

## 2012-07-13 LAB — BODY FLUID CELL COUNT WITH DIFFERENTIAL
Eos, Fluid: 0 %
Lymphs, Fluid: 10 %
Monocyte-Macrophage-Serous Fluid: 8 % — ABNORMAL LOW (ref 50–90)
Neutrophil Count, Fluid: 82 % — ABNORMAL HIGH (ref 0–25)
Other Cells, Fluid: 0 %
Total Nucleated Cell Count, Fluid: 1227 cu mm — ABNORMAL HIGH (ref 0–1000)

## 2012-07-13 LAB — LACTATE DEHYDROGENASE, PLEURAL OR PERITONEAL FLUID: LD, Fluid: 1446 U/L — ABNORMAL HIGH (ref 3–23)

## 2012-07-13 LAB — PROTEIN, BODY FLUID: Total protein, fluid: 3.9 g/dL

## 2012-07-13 LAB — PROTIME-INR: Prothrombin Time: 24.6 seconds — ABNORMAL HIGH (ref 11.6–15.2)

## 2012-07-13 MED ORDER — MIDAZOLAM HCL 2 MG/2ML IJ SOLN
INTRAMUSCULAR | Status: AC
  Administered 2012-07-13: 2 mg
  Filled 2012-07-13: qty 4

## 2012-07-13 MED ORDER — WARFARIN SODIUM 2.5 MG PO TABS
2.5000 mg | ORAL_TABLET | Freq: Once | ORAL | Status: DC
  Filled 2012-07-13: qty 1

## 2012-07-13 NOTE — Progress Notes (Signed)
ENRRIQUE [name] 1929/03/13 LOS 7  Date 07/13/12     [facility]  Brief history/narrative. The patient was admitted on 07/06/2012 with pleuritic chest pain. He has chronic atrial fibrillation and is chronically anticoagulated with Coumadin. He had a recent hospitalization from 422 2014 through 07/01/2012 for pacemaker system infection. This was complicated by presumed microperforation of the RA and RV leads requiring lead revision on 06/29/2012 and 06/30/2012 by Dr. Sharrell Ku. He was treated with antibiotics for approximately one week. When he presented on 07/06/2012 with pleuritic chest pain, his chest x-ray revealed increase in the size of right pleural effusion and right base infiltrate versus atelectasis. His white blood cell count was elevated at 13 and he was borderline febrile at 99.7. He was started on healthcare associated antibiotics. Ultrasound of his chest was ordered to assess the pleural effusion. When it revealed a large effusion, interventional radiologist performed a thoracentesis on 07/08/2012 yielding 1.5 L of bloody fluid. Fluid culture is negative to date. Fluid protein was 3.8 and fluid LDH was 1831.  The patient received IV antibiotics throughout admission - 7 days of iv Vanc and Zosyn .A CT of his chest ordered on 07/12/12 revealed a large complex multilocular right-sided pleural effusion. Chest tube placed on 5/7 by Dr. Tyrone Sage - 1100 cc of blody fluid drained.   Recent pacemaker lead microperf, status post extraction of previous pacemaker and insertion of another. Followed by cardiologist Dr. Ladona Ridgel.  Nonischemic cardiomyopathy and hypertension. Currently stable. ProBNP is modestly elevated. Continue digoxin, Coreg, statin.  Chronic atrial fibrillation. Currently stable and controlled. Anticoagulated. Digoxin level in the therapeutic range. Continue amiodarone, digoxin, and carvedilol  Mild hepatic transaminitis. Resolved.       Subjective: Just had chest tube  placed   Objective: Vital signs in last 24 hours: Filed Vitals:   07/12/12 1608 07/12/12 1820 07/12/12 2033 07/13/12 0527  BP: 137/54  100/52 115/56  Pulse: 81  70 72  Temp: 100.1 F (37.8 C) 100.1 F (37.8 C) 98.9 F (37.2 C) 97.9 F (36.6 C)  TempSrc: Oral  Oral Oral  Resp: 17  18 18   Height:      Weight:      SpO2: 95%  93% 95%    Intake/Output Summary (Last 24 hours) at 07/13/12 0809 Last data filed at 07/13/12 0600  Gross per 24 hour  Intake    730 ml  Output   2475 ml  Net  -1745 ml    Weight change:   Physical exam: General: Pleasant 79 year old Caucasian man in no acute distress Lungs: Decreased breath sounds on the right with rare crackles. and clear on the left. Heart: Irregular, irregular. Abdomen: Positive bowel sounds, soft, nontender, nondistended. Extremities: No pedal edema.  Lab Results: Basic Metabolic Panel:  Recent Labs  98/11/91 0438 07/12/12 0450  NA 136 137  K 4.0 4.1  CL 100 100  CO2 27 27  GLUCOSE 116* 125*  BUN 14 12  CREATININE 0.89 0.93  CALCIUM 8.5 8.6   Liver Function Tests: No results found for this basename: AST, ALT, ALKPHOS, BILITOT, PROT, ALBUMIN,  in the last 72 hours No results found for this basename: LIPASE, AMYLASE,  in the last 72 hours No results found for this basename: AMMONIA,  in the last 72 hours CBC:  Recent Labs  07/11/12 0438 07/12/12 0450  WBC 11.6* 13.0*  HGB 11.1* 11.2*  HCT 31.8* 31.6*  MCV 82.4 81.7  PLT 382 451*   Cardiac Enzymes: No results  found for this basename: CKTOTAL, CKMB, CKMBINDEX, TROPONINI,  in the last 72 hours BNP: No results found for this basename: PROBNP,  in the last 72 hours D-Dimer: No results found for this basename: DDIMER,  in the last 72 hours CBG: No results found for this basename: GLUCAP,  in the last 72 hours Hemoglobin A1C: No results found for this basename: HGBA1C,  in the last 72 hours Fasting Lipid Panel: No results found for this basename: CHOL,  HDL, LDLCALC, TRIG, CHOLHDL, LDLDIRECT,  in the last 72 hours Thyroid Function Tests: No results found for this basename: TSH, T4TOTAL, FREET4, T3FREE, THYROIDAB,  in the last 72 hours Anemia Panel: No results found for this basename: VITAMINB12, FOLATE, FERRITIN, TIBC, IRON, RETICCTPCT,  in the last 72 hours Coagulation:  Recent Labs  07/12/12 0450 07/13/12 0500  LABPROT 29.6* 24.6*  INR 3.01* 2.34*   Urine Drug Screen: Drugs of Abuse  No results found for this basename: labopia,  cocainscrnur,  labbenz,  amphetmu,  thcu,  labbarb    Alcohol Level: No results found for this basename: ETH,  in the last 72 hours Urinalysis: No results found for this basename: COLORURINE, APPERANCEUR, LABSPEC, PHURINE, GLUCOSEU, HGBUR, BILIRUBINUR, KETONESUR, PROTEINUR, UROBILINOGEN, NITRITE, LEUKOCYTESUR,  in the last 72 hours Misc. Labs:   Micro: Recent Results (from the past 240 hour(s))  CULTURE, BLOOD (ROUTINE X 2)     Status: None   Collection Time    07/06/12 11:00 AM      Result Value Range Status   Specimen Description BLOOD RIGHT ANTECUBITAL   Final   Special Requests BOTTLES DRAWN AEROBIC ONLY 5CC   Final   Culture  Setup Time 07/06/2012 16:16   Final   Culture NO GROWTH 5 DAYS   Final   Report Status 07/12/2012 FINAL   Final  CULTURE, BLOOD (ROUTINE X 2)     Status: None   Collection Time    07/06/12 11:15 AM      Result Value Range Status   Specimen Description BLOOD HAND RIGHT   Final   Special Requests BOTTLES DRAWN AEROBIC ONLY 5CC   Final   Culture  Setup Time 07/06/2012 16:15   Final   Culture NO GROWTH 5 DAYS   Final   Report Status 07/12/2012 FINAL   Final  BODY FLUID CULTURE     Status: None   Collection Time    07/08/12 11:06 AM      Result Value Range Status   Specimen Description FLUID RIGHT PLEURAL   Final   Special Requests Normal   Final   Gram Stain     Final   Value: NO WBC SEEN     NO ORGANISMS SEEN   Culture NO GROWTH 3 DAYS   Final   Report Status  07/11/2012 FINAL   Final    Studies/Results: Ct Chest W Contrast  07/12/2012  *RADIOLOGY REPORT*  Clinical Data: Evaluate for pneumonia and right-sided pleural effusion.  CT CHEST WITH CONTRAST  Technique:  Multidetector CT imaging of the chest was performed following the standard protocol during bolus administration of intravenous contrast.  Contrast: 80mL OMNIPAQUE IOHEXOL 300 MG/ML  SOLN  Comparison: No priors.  Chest x-ray 07/10/2012.  Findings:  Mediastinum: Heart size is normal. There is no significant pericardial fluid, thickening or pericardial calcification. There is atherosclerosis of the thoracic aorta, the great vessels of the mediastinum and the coronary arteries, including calcified atherosclerotic plaque in the left main, left anterior descending, left circumflex  and right coronary arteries. No pathologically enlarged mediastinal or hilar lymph nodes. Esophagus is unremarkable in appearance.  A left-sided biventricular pacemaker is noted with lead tips terminating in the right atrial appendage, right ventricular apex and overlying the lateral wall of the left ventricle via the coronary sinus and coronary veins.  Lungs/Pleura: Large complex multilocular right-sided pleural effusion with multiple thin internal septations.  There is either high attenuation debris or enhancing material in the posterior aspect of the right hemithorax.  Extensive passive atelectasis in the right lung, predominately in the right lower lobe.  No definite consolidative airspace disease is identified at this time.  No definite suspicious appearing pulmonary nodule or mass is noted. Left lung is well-aerated.  Trace left pleural effusion.  Upper Abdomen: Unremarkable.  Musculoskeletal: There are no aggressive appearing lytic or blastic lesions noted in the visualized portions of the skeleton.  IMPRESSION: 1.  There is a large complex multi locular right-sided pleural effusion.  Differential considerations include an empyema  and a malignant pleural effusion.  Sampling of pleural fluid is recommended. 2.  No current airspace consolidation in the right lung identified at this time to suggest the presence of a pneumonia. 3.  Trace left pleural effusion. 4.  Atherosclerosis, including left main and three-vessel coronary artery disease. 5.  Additional incidental findings, as above.   Original Report Authenticated By: Trudie Reed, M.D.     Medications:  Scheduled: . amiodarone  200 mg Oral Daily  . benzonatate  100 mg Oral TID  . carvedilol  12.5 mg Oral BID WC  . digoxin  125 mcg Oral Daily  . docusate sodium  100 mg Oral BID  . finasteride  5 mg Oral 4 times weekly  . fluticasone  2 spray Each Nare Daily  . magnesium oxide  400 mg Oral Daily  . pantoprazole  40 mg Oral 4 times weekly  . piperacillin-tazobactam (ZOSYN)  IV  3.375 g Intravenous Q8H  . pravastatin  80 mg Oral q1800  . sodium chloride  3 mL Intravenous Q12H  . vancomycin  1,000 mg Intravenous Q12H  . Warfarin - Pharmacist Dosing Inpatient   Does not apply q1800   Continuous:  ZOX:[id number], alum & mag hydroxide-simeth, HYDROcodone-acetaminophen, hydrocortisone cream, levalbuterol, morphine injection, ondansetron (ZOFRAN) IV, ondansetron  Assessment: Principal Problem:   Pleuritic chest pain Active Problems:   Atrial fibrillation   SICK SINUS SYNDROME   Nonischemic cardiomyopathy   HCAP (healthcare-associated pneumonia)   Pleural effusion on right   Pacemaker infection   Transaminitis    LOS: 7 days   Delise Simenson 07/13/2012, 8:09 AM

## 2012-07-13 NOTE — Consult Note (Signed)
PULMONARY  / CRITICAL CARE MEDICINE  Name: [name] MRN: [id number] DOB: 1929/12/19    ADMISSION DATE:  07/06/2012 CONSULTATION DATE:  5/7  REFERRING MD :  Sherrie Mustache  PRIMARY SERVICE:  [facility]   CHIEF COMPLAINT/reason for consult :  Pleuritic type CP, pleural effusion, fever   BRIEF PATIENT DESCRIPTION:  This is a 79 year old male Admitted on 4/30 for presumed HCAP & effusion. Thoracentesis on 5/2 at which time 1.5 liters of bloody fluid was removed. Post-thora film still demonstrated sig residual effusion.  Analysis was c/w exudate. Cultures and path neg. Antibiotics continued. On 5/6 pt spiked temp and WBC climbed. CT chest obtained: showed large complex multi-locular right sided effusion which was the reason for PCCM consult.    SIGNIFICANT EVENTS / STUDIES:  5/2: thoracentesis: 1.5L bloody exudate sample: path neg for malig. Cultures negative.   LINES / TUBES:  CULTURES: BCX2 5/6>>> Pleural fluid 5/2: neg   ANTIBIOTICS: Zosyn 4/30>>> vanc 4/30>>>  HISTORY OF PRESENT ILLNESS:   This is a 79 year old male who recently underwent pacemaker revision for infected PM wires on 4/22. His initial pacemaker was removed from the left chest, he went home with a temp PM on right. Returned to the hospital on 4/22 for temp PM removal and placement of new PPM. Stay was complicated and required total of 3 visits to EP as there was difficulty with pacemaker wire attachement.  Admitted on 4/30 for HCAP w/ presumed parapneumonic effusion. He was admitted to med service. Treated w/ empiric antibiotics (for HCAP), and underwent right thoracentesis on 5/2 at which time 1.5 liters of bloody fluid was removed. Post-thora film still demonstrated sig residual effusion.  Analysis was c/w exudate. Cultures and path neg. Antibiotics continued. On 5/6 pt spiked temp and WBC climbed. CT chest obtained: showed large complex multi-locular right sided effusion which was the reason for PCCM consult.   PAST MEDICAL  HISTORY :  Past Medical History  Diagnosis Date  . Coronary artery disease   . Cardiomyopathy primary-nonischemic EF 45%  . Hyperlipidemia   . GERD (gastroesophageal reflux disease)   . Polymyalgia rheumatica   . Sick sinus syndrome   . Atrial fibrillation   . Dysphagia   . Hearing loss, mixed, bilateral   . Increased prostate specific antigen (PSA) velocity   . Lumbar back pain   . BPH (benign prostatic hypertrophy)   . Breast mass     "on both sides" (06/28/2012)  . CHF (congestive heart failure)   . Elevated liver enzymes   . Esophageal stricture   . Pacemaker   . Arthritis     "some" (06/28/2012)  . Pacemaker infection     06/28/12   Past Surgical History  Procedure Laterality Date  . Cataract extraction w/ intraocular lens  implant, bilateral Bilateral   . Umbilical hernia repair    . Insert / replace / remove pacemaker    . Inguinal hernia repair Right 2012    [facility]  . Umbilical hernia repair    . Cardioversion  06/17/2011    Procedure: CARDIOVERSION;  Surgeon: [name], MD;  Location: [facility] OR;  Service: Cardiovascular;  Laterality: N/A;  . Cardioversion  09/09/2011    Procedure: CARDIOVERSION;  Surgeon: Luis Abed, MD;  Location: [facility] OR;  Service: Cardiovascular;  Laterality: N/A;  . Cardioversion  01/01/2012    Procedure: CARDIOVERSION;  Surgeon: Herby Abraham, MD;  Location: [facility] ENDOSCOPY;  Service: Cardiovascular;  Laterality: N/A;  .  Tee without cardioversion  01/01/2012    Procedure: TRANSESOPHAGEAL ECHOCARDIOGRAM (TEE);  Surgeon: Pricilla Riffle, MD;  Location: [facility] ENDOSCOPY;  Service: Cardiovascular;  Laterality: N/A;  . Eye surgery    . Generator removal Left 06/15/2012    Procedure: GENERATOR REMOVAL;  Surgeon: Marinus Maw, MD;  Location: [facility] OR;  Service: Cardiovascular;  Laterality: Left;  . Pacemaker generator change N/A 06/15/2012    Procedure: PACEMAKER GENERATOR CHANGE;  Surgeon: Marinus Maw, MD;  Location: [facility] OR;  Service: Cardiovascular;   Laterality: N/A;  . Tonsillectomy and adenoidectomy    . Cardiac catheterization     Prior to Admission medications   Medication Sig Start Date End Date Taking? Authorizing Provider  acetaminophen (TYLENOL) 500 MG tablet Take 500 mg by mouth daily as needed for pain.   Yes Historical Provider, MD  amiodarone (PACERONE) 200 MG tablet Take 1 tablet (200 mg total) by mouth daily. 03/10/12  Yes Lindley Magnus, MD  carvedilol (COREG) 12.5 MG tablet Take 12.5 mg by mouth 2 (two) times daily with a meal.   Yes Historical Provider, MD  digoxin (LANOXIN) 0.25 MG tablet Take 0.5 tablets (125 mcg total) by mouth daily. 03/10/12  Yes Lindley Magnus, MD  finasteride (PROSCAR) 5 MG tablet Take 5 mg by mouth 4 (four) times a week. Monday, Tuesday, Thursday, Saturday 04/30/11  Yes Historical Provider, MD  fluticasone (FLONASE) 50 MCG/ACT nasal spray Place 2 sprays into the nose daily. 06/01/12  Yes Lindley Magnus, MD  magnesium oxide (MAG-OX) 400 MG tablet Take 400 mg by mouth daily.   Yes Historical Provider, MD  Multiple Vitamins-Minerals (MULTIVITAMIN WITH MINERALS) tablet Take 1 tablet by mouth daily.     Yes Historical Provider, MD  pantoprazole (PROTONIX) 40 MG tablet Take 40 mg by mouth 4 (four) times a week. Monday, Tuesday, Thursday and Saturday   Yes Historical Provider, MD  pravastatin (PRAVACHOL) 80 MG tablet Take 1 tablet (80 mg total) by mouth daily. 03/10/12  Yes Bruce Romilda Garret, MD  albuterol (PROVENTIL HFA;VENTOLIN HFA) 108 (90 BASE) MCG/ACT inhaler Inhale 2 puffs into the lungs every 6 (six) hours as needed for wheezing or shortness of breath. 07/12/12   Elliot Cousin, MD  amoxicillin-clavulanate (AUGMENTIN) 875-125 MG per tablet Take 1 tablet by mouth 2 (two) times daily. Antibiotic to take for 10 more days. 07/12/12   Elliot Cousin, MD  benzonatate (TESSALON) 100 MG capsule Take 1 capsule (100 mg total) by mouth 3 (three) times daily as needed for cough. 07/12/12   Elliot Cousin, MD   HYDROcodone-acetaminophen (NORCO/VICODIN) 5-325 MG per tablet Take 1 tablet by mouth every 4 (four) hours as needed. 07/12/12   Elliot Cousin, MD  warfarin (COUMADIN) 5 MG tablet Take 0.5 tablets (2.5 mg total) by mouth daily. 1 tablet (5 mg) on Monday, Wednesday and Friday, 1/2 tablet (2.5 mg) on all other days 07/12/12   Elliot Cousin, MD   Allergies  Allergen Reactions  . Antihistamines, Diphenhydramine-Type Other (See Comments)    Causes difficulty in ability to urinate.    FAMILY HISTORY:  Family History  Problem Relation Age of Onset  . Heart attack Father 73    deceased  . Aneurysm Mother     deceased AAA  . Hypertension Mother   . Other Brother     deceased at birth   SOCIAL HISTORY:  reports that he quit smoking about 29 years ago. His smoking use included Cigarettes. He has a 40 pack-year smoking  history. He has never used smokeless tobacco. He reports that he does not drink alcohol or use illicit drugs.  REVIEW OF SYSTEMS (bolds are positive):   Constitutional: Negative for fever, chills, weight loss, malaise/fatigue and diaphoresis.  HENT: Negative for hearing loss, ear pain, nosebleeds, congestion, sore throat, neck pain, tinnitus and ear discharge.   Eyes: Negative for blurred vision, double vision, photophobia, pain, discharge and redness.  Respiratory: Negative for cough, non-productive, hemoptysis, sputum production, shortness of breath, wheezing and stridor.   Cardiovascular: Negative for chest pain, pleuritic in nature palpitations, orthopnea, claudication, leg swelling and PND.  Gastrointestinal: Negative for heartburn, nausea, vomiting, abdominal pain, diarrhea, constipation, blood in stool and melena.  Genitourinary: Negative for dysuria, urgency, frequency, hematuria and flank pain.  Musculoskeletal: Negative for myalgias, back pain, joint pain and falls.  Skin: Negative for itching and rash.  Neurological: Negative for dizziness, tingling, tremors, sensory  change, speech change, focal weakness, seizures, loss of consciousness, weakness and headaches.  Endo/Heme/Allergies: Negative for environmental allergies and polydipsia. Does not bruise/bleed easily.  SUBJECTIVE:  Breathing okay at rest, but winded with exertion.  Pain with deep breath on right.  VITAL SIGNS: Temp:  [97.9 F (36.6 C)-100.1 F (37.8 C)] 97.9 F (36.6 C) (05/07 0527) Pulse Rate:  [69-81] 72 (05/07 0527) Resp:  [17-18] 18 (05/07 0527) BP: (100-137)/(52-56) 115/56 mmHg (05/07 0527) SpO2:  [93 %-95 %] 95 % (05/07 0527)  PHYSICAL EXAMINATION: General:  79 year old male, looks younger than state age.  Neuro:  Awake, alert, no deficits HEENT:  [place], no JVD  Cardiovascular:  Rrr, pacer scar on Rt well healed, pacer site on Lt clean Lungs:  Decreased on right, no wheeze Abdomen:  Soft, non-tender  Musculoskeletal:  Intact  Skin:  Intact    Recent Labs Lab 07/09/12 0443 07/11/12 0438 07/12/12 0450  NA 132* 136 137  K 3.7 4.0 4.1  CL 97 100 100  CO2 28 27 27   BUN 17 14 12   CREATININE 0.92 0.89 0.93  GLUCOSE 141* 116* 125*    Recent Labs Lab 07/10/12 0518 07/11/12 0438 07/12/12 0450  HGB 11.1* 11.1* 11.2*  HCT 32.6* 31.8* 31.6*  WBC 11.5* 11.6* 13.0*  PLT 395 382 451*   Ct Chest W Contrast  07/12/2012  *RADIOLOGY REPORT*  Clinical Data: Evaluate for pneumonia and right-sided pleural effusion.  CT CHEST WITH CONTRAST  Technique:  Multidetector CT imaging of the chest was performed following the standard protocol during bolus administration of intravenous contrast.  Contrast: 80mL OMNIPAQUE IOHEXOL 300 MG/ML  SOLN  Comparison: No priors.  Chest x-ray 07/10/2012.  Findings:  Mediastinum: Heart size is normal. There is no significant pericardial fluid, thickening or pericardial calcification. There is atherosclerosis of the thoracic aorta, the great vessels of the mediastinum and the coronary arteries, including calcified atherosclerotic plaque in the left main, left  anterior descending, left circumflex and right coronary arteries. No pathologically enlarged mediastinal or hilar lymph nodes. Esophagus is unremarkable in appearance.  A left-sided biventricular pacemaker is noted with lead tips terminating in the right atrial appendage, right ventricular apex and overlying the lateral wall of the left ventricle via the coronary sinus and coronary veins.  Lungs/Pleura: Large complex multilocular right-sided pleural effusion with multiple thin internal septations.  There is either high attenuation debris or enhancing material in the posterior aspect of the right hemithorax.  Extensive passive atelectasis in the right lung, predominately in the right lower lobe.  No definite consolidative airspace disease is  identified at this time.  No definite suspicious appearing pulmonary nodule or mass is noted. Left lung is well-aerated.  Trace left pleural effusion.  Upper Abdomen: Unremarkable.  Musculoskeletal: There are no aggressive appearing lytic or blastic lesions noted in the visualized portions of the skeleton.  IMPRESSION: 1.  There is a large complex multi locular right-sided pleural effusion.  Differential considerations include an empyema and a malignant pleural effusion.  Sampling of pleural fluid is recommended. 2.  No current airspace consolidation in the right lung identified at this time to suggest the presence of a pneumonia. 3.  Trace left pleural effusion. 4.  Atherosclerosis, including left main and three-vessel coronary artery disease. 5.  Additional incidental findings, as above.   Original Report Authenticated By: Trudie Reed, M.D.     ASSESSMENT / PLAN:  1) Complicated Right pleural effusion. No sig radiographic evidence of PNA on CT chest. Think that this is likely related to his old infected PPM. Doubt malignancy as effusion was not present prior to PPM issues. Have spoke with thoracics. They will place large bore CT.  Plan: CT placement per  thoracics Hold coumadin Repeat film post CT insertion. If no improvement will need VATS Cont empiric abx  2) h/o CHB w/ PPM, Afib, NICM w/ EF 45-50% Plan: Hold coumadin per above Cont routine cardiac meds  All other issues per IM service. Have spoken w/ thoracic surgery .   Reviewed above, examined pt, and agree with assessment/plan.  79 yo male with infected Rt pacemaker had extraction 4/10 with temporary pacer placed.  Had permanent pacer placed on Lt 4/22.  Has progressive Rt effusion since at least 4/22 associated with fever, dyspnea, and pleuritic chest pain.  S/p thoracentesis by IR which showed neutrophil predominant exudative effusion with bloody appearance (hematocrit from pleural effusion not sent).  Has progression of effusion since thoracentesis.  CT chest shows partial loculations.  Have d/w Dr. Tyrone Sage with thoracic surgery.  He will evaluate pt, and then decide about chest tube drainage versus VATs.  Recommend continue current Abx per [facility].  Defer further management to [facility] and Thoracic surgery.  PCCM will sign off.  [name], MD [facility] Pulmonary/Critical Care 07/13/2012, 10:02 AM Pager:  [phone] After 3pm call: [phone]

## 2012-07-13 NOTE — Progress Notes (Signed)
Chest tube output was at 1800.

## 2012-07-13 NOTE — Consult Note (Signed)
[address]          [place] [address]       [phone]       [name] [facility] Medical Record #[id number] Date of Birth: [birth year]  Referring: No ref. provider found Primary Care: [name], MD  Chief Complaint:    Chief Complaint  Patient presents with  . Chest Pain  . Pacemaker Problem    History of Present Illness: the patient is an 79 year old male who has recently undergone multiple procedures on his pacemaker including removal and placement of new leads which is well documented due to infection.he has been treated with antibiotics as well. He is known to be on Coumadin four atrial fibrillation.. He has developed a right-sided pleuritic chest pain/fever with pleural effusion that underwent recent thoracentesis to obtain 1. 5 L of bloody  fluid. We are asked to see in consultation as he has re-accumulated more fluid which appears to be potentially complex on the CT scan .We'll attempt to drain via chest tube placement but possibly he may require video-assisted thoracoscopy.Cultures have been negative thus far.    Current Activity/ Functional Status: Patient is independent with mobility/ambulation, transfers, ADL's, IADL's.   Zubrod Score: At the time of surgery this patient's most appropriate activity status/level should be described as: []  Normal activity, no symptoms [x]  Symptoms, fully ambulatory []  Symptoms, in bed less than or equal to 50% of the time []  Symptoms, in bed greater than 50% of the time but less than 100% []  Bedridden []  Moribund  Past Medical History  Diagnosis Date  . Coronary artery disease   . Cardiomyopathy primary-nonischemic EF 45%  . Hyperlipidemia   . GERD (gastroesophageal reflux disease)   . Polymyalgia rheumatica   . Sick sinus syndrome   . Atrial fibrillation   . Dysphagia   . Hearing loss, mixed, bilateral   . Increased prostate specific antigen (PSA) velocity   . Lumbar  back pain   . BPH (benign prostatic hypertrophy)   . Breast mass     "on both sides" (06/28/2012)  . CHF (congestive heart failure)   . Elevated liver enzymes   . Esophageal stricture   . Pacemaker   . Arthritis     "some" (06/28/2012)  . Pacemaker infection     06/28/12    Past Surgical History  Procedure Laterality Date  . Cataract extraction w/ intraocular lens  implant, bilateral Bilateral   . Umbilical hernia repair    . Insert / replace / remove pacemaker    . Inguinal hernia repair Right 2012    RIH  . Umbilical hernia repair    . Cardioversion  06/17/2011    Procedure: CARDIOVERSION;  Surgeon: [name], MD;  Location: [facility] OR;  Service: Cardiovascular;  Laterality: N/A;  . Cardioversion  09/09/2011    Procedure: CARDIOVERSION;  Surgeon: Luis Abed, MD;  Location: [facility] OR;  Service: Cardiovascular;  Laterality: N/A;  . Cardioversion  01/01/2012    Procedure: CARDIOVERSION;  Surgeon: Herby Abraham, MD;  Location: [facility] ENDOSCOPY;  Service: Cardiovascular;  Laterality: N/A;  . Tee without cardioversion  01/01/2012    Procedure: TRANSESOPHAGEAL ECHOCARDIOGRAM (TEE);  Surgeon: Pricilla Riffle, MD;  Location: [facility] ENDOSCOPY;  Service: Cardiovascular;  Laterality: N/A;  . Eye surgery    . Generator removal Left 06/15/2012    Procedure: GENERATOR REMOVAL;  Surgeon: [name]  Ladona Ridgel, MD;  Location: [facility] OR;  Service: Cardiovascular;  Laterality: Left;  . Pacemaker generator change N/A 06/15/2012    Procedure: PACEMAKER GENERATOR CHANGE;  Surgeon: Marinus Maw, MD;  Location: [facility] OR;  Service: Cardiovascular;  Laterality: N/A;  . Tonsillectomy and adenoidectomy    . Cardiac catheterization      History  Smoking status  . Former Smoker -- 1.00 packs/day for 40 years  . Types: Cigarettes  . Quit date: 03/10/1983  Smokeless tobacco  . Never Used    History  Alcohol Use No    History   Social History  . Marital Status: Married    Spouse Name: N/A    Number of Children: N/A  .  Years of Education: N/A   Occupational History  . Not on file.   Social History Main Topics  . Smoking status: Former Smoker -- 1.00 packs/day for 40 years    Types: Cigarettes    Quit date: 03/10/1983  . Smokeless tobacco: Never Used  . Alcohol Use: No  . Drug Use: No  . Sexually Active: Not on file   Other Topics Concern  . Not on file   Social History Narrative  . No narrative on file    Allergies  Allergen Reactions  . Antihistamines, Diphenhydramine-Type Other (See Comments)    Causes difficulty in ability to urinate.    Current Facility-Administered Medications  Medication Dose Route Frequency Provider Last Rate Last Dose  . acetaminophen (TYLENOL) tablet 650 mg  650 mg Oral Q6H PRN Elliot Cousin, MD   650 mg at 07/13/12 0257  . alum & mag hydroxide-simeth (MAALOX/MYLANTA) 200-200-20 MG/5ML suspension 30 mL  30 mL Oral Q6H PRN Elliot Cousin, MD      . amiodarone (PACERONE) tablet 200 mg  200 mg Oral Daily Elliot Cousin, MD   200 mg at 07/13/12 1004  . benzonatate (TESSALON) capsule 100 mg  100 mg Oral TID Elliot Cousin, MD   100 mg at 07/13/12 1004  . carvedilol (COREG) tablet 12.5 mg  12.5 mg Oral BID WC Elliot Cousin, MD   12.5 mg at 07/13/12 0651  . digoxin (LANOXIN) tablet 125 mcg  125 mcg Oral Daily Elliot Cousin, MD   125 mcg at 07/13/12 1004  . docusate sodium (COLACE) capsule 100 mg  100 mg Oral BID Elliot Cousin, MD   100 mg at 07/13/12 1004  . finasteride (PROSCAR) tablet 5 mg  5 mg Oral 4 times weekly Elliot Cousin, MD   5 mg at 07/12/12 0805  . fluticasone (FLONASE) 50 MCG/ACT nasal spray 2 spray  2 spray Each Nare Daily Elliot Cousin, MD   2 spray at 07/13/12 1004  . HYDROcodone-acetaminophen (NORCO/VICODIN) 5-325 MG per tablet 1 tablet  1 tablet Oral Q4H PRN Elliot Cousin, MD   1 tablet at 07/06/12 1925  . hydrocortisone cream 1 %   Topical TID PRN Erick Blinks, MD      . levalbuterol Pauline Aus) nebulizer solution 0.63 mg  0.63 mg Nebulization Q4H PRN  Elliot Cousin, MD   0.63 mg at 07/11/12 1415  . magnesium oxide (MAG-OX) tablet 400 mg  400 mg Oral Daily Elliot Cousin, MD   400 mg at 07/13/12 1004  . morphine 2 MG/ML injection 2 mg  2 mg Intravenous Q2H PRN Elliot Cousin, MD      . ondansetron [facility]) tablet 4 mg  4 mg Oral Q6H PRN Elliot Cousin, MD       Or  . ondansetron [facility])  injection 4 mg  4 mg Intravenous Q6H PRN Elliot Cousin, MD      . pantoprazole (PROTONIX) EC tablet 40 mg  40 mg Oral 4 times weekly Elliot Cousin, MD   40 mg at 07/12/12 0805  . piperacillin-tazobactam (ZOSYN) IVPB 3.375 g  3.375 g Intravenous Q8H Elliot Cousin, MD   3.375 g at 07/13/12 0914  . pravastatin (PRAVACHOL) tablet 80 mg  80 mg Oral q1800 Elliot Cousin, MD   80 mg at 07/12/12 1719  . sodium chloride 0.9 % injection 3 mL  3 mL Intravenous Q12H Elliot Cousin, MD   3 mL at 07/13/12 1004  . vancomycin (VANCOCIN) IVPB 1000 mg/200 mL premix  1,000 mg Intravenous Q12H Arman Filter, RPH   1,000 mg at 07/13/12 0600  . Warfarin - Pharmacist Dosing Inpatient   Does not apply q1800 Arman Filter, [facility]        Prescriptions prior to admission  Medication Sig Dispense Refill  . acetaminophen (TYLENOL) 500 MG tablet Take 500 mg by mouth daily as needed for pain.      Marland Kitchen amiodarone (PACERONE) 200 MG tablet Take 1 tablet (200 mg total) by mouth daily.  90 tablet  3  . carvedilol (COREG) 12.5 MG tablet Take 12.5 mg by mouth 2 (two) times daily with a meal.      . digoxin (LANOXIN) 0.25 MG tablet Take 0.5 tablets (125 mcg total) by mouth daily.  90 tablet  3  . finasteride (PROSCAR) 5 MG tablet Take 5 mg by mouth 4 (four) times a week. Monday, Tuesday, Thursday, Saturday      . fluticasone (FLONASE) 50 MCG/ACT nasal spray Place 2 sprays into the nose daily.  16 g  3  . magnesium oxide (MAG-OX) 400 MG tablet Take 400 mg by mouth daily.      . Multiple Vitamins-Minerals (MULTIVITAMIN WITH MINERALS) tablet Take 1 tablet by mouth daily.        . pantoprazole  (PROTONIX) 40 MG tablet Take 40 mg by mouth 4 (four) times a week. Monday, Tuesday, Thursday and Saturday      . pravastatin (PRAVACHOL) 80 MG tablet Take 1 tablet (80 mg total) by mouth daily.  90 tablet  3  . [DISCONTINUED] warfarin (COUMADIN) 5 MG tablet Take 2.5-5 mg by mouth daily. 1 tablet (5 mg) on Monday, Wednesday and Friday, 1/2 tablet (2.5 mg) on all other days        Family History  Problem Relation Age of Onset  . Heart attack Father 1    deceased  . Aneurysm Mother     deceased AAA  . Hypertension Mother   . Other Brother     deceased at birth     Review of Systems:     Cardiac Review of Systems: Y or N  Chest Pain [n    ]  Resting SOB [  y ] Exertional SOB  Cove.Etienne  ]  Orthopnea Cove.Etienne  ]   Pedal Edema [ n  ]    Palpitations Cove.Etienne  ] Syncope  [  ]   Presyncope [   ]  General Review of Systems: [Y] = yes [  ]=no Constitional: recent weight change [  ]; anorexia Cove.Etienne  ]; fatigue [  ]; nausea [  ]; night sweats [  ]; fever [  ]; or chills [  ]  Dental: poor dentition[  ]; Last Dentist visit:   Eye : blurred vision [n  ]; diplopia [ n  ]; vision changes [  ];  Amaurosis fugax[ n ]; Resp: cough Cove.Etienne  ];  wheezing[ y ];  hemoptysis[n  ]; shortness of breath[y  ]; paroxysmal nocturnal dyspnea[n  ]; dyspnea on exertion[  y]; or orthopnea[y  ];  GI:  gallstones[n  ], vomiting[nn  ];  dysphagia[  ]; melena[ n ];  hematochezia [n  ]; heartburn[ n ];   Hx of  Colonoscopy[  ]; GU: kidney stones [n  ]; hematuria[ n ];   dysuria [n  ];  nocturia[ y ];  history of     obstruction [  ]; urinary frequency [ y ]             Skin: rash, swelling[y  ];, hair loss[  ];  peripheral edema[  ];  or itching[  ]; Musculosketetal: myalgias[ y ];  joint swelling[  ];  joint erythema[  ];  joint pain[y  ];  Mild knee back pain[y  ];  Heme/Lymph: bruising[  ];  bleeding[  ];  anemia[  ];  Neuro: TIA[ n ];  headaches[n  ];  stroke[n  ];  vertigo[  ];   seizures[ n ];   paresthesias[  ];  difficulty walking[n  ];  Psych:depression[n  ]; anxiety[ n ];  Endocrine: diabetes[ n ];  thyroid dysfunction[n  ];  Immunizations: Flu [  ]; Pneumococcal[  ];  Other:  Physical Exam: BP 115/56  Pulse 72  Temp(Src) 97.9 F (36.6 C) (Oral)  Resp 18  Ht 5\' 7"  (1.702 m)  Wt 160 lb 9.6 oz (72.848 kg)  BMI 25.15 kg/m2  SpO2 95%  General appearance: alert, cooperative and no distress Neurologic: intact Heart: regular rate and rhythm, S1, S2 normal, no murmur, click, rub or gallop Lungs: Diminished in the right base otherwise clear Abdomen: soft, nontender, no masses or organomegally Extremities: Minor pedal edema Wound: Pacemaker site is healing well covered with numerous Steri-Strips Heent: Normocephalic atraumatic pupils equal round and reactive to light sclerae icteric Neck: supple without adenopathy  Diagnostic Studies & Laboratory data:     Recent Radiology Findings:   Ct Chest W Contrast  07/12/2012  *RADIOLOGY REPORT*  Clinical Data: Evaluate for pneumonia and right-sided pleural effusion.  CT CHEST WITH CONTRAST  Technique:  Multidetector CT imaging of the chest was performed following the standard protocol during bolus administration of intravenous contrast.  Contrast: 80mL OMNIPAQUE IOHEXOL 300 MG/ML  SOLN  Comparison: No priors.  Chest x-ray 07/10/2012.  Findings:  Mediastinum: Heart size is normal. There is no significant pericardial fluid, thickening or pericardial calcification. There is atherosclerosis of the thoracic aorta, the great vessels of the mediastinum and the coronary arteries, including calcified atherosclerotic plaque in the left main, left anterior descending, left circumflex and right coronary arteries. No pathologically enlarged mediastinal or hilar lymph nodes. Esophagus is unremarkable in appearance.  A left-sided biventricular pacemaker is noted with lead tips terminating in the right atrial appendage, right ventricular  apex and overlying the lateral wall of the left ventricle via the coronary sinus and coronary veins.  Lungs/Pleura: Large complex multilocular right-sided pleural effusion with multiple thin internal septations.  There is either high attenuation debris or enhancing material in the posterior aspect of the right hemithorax.  Extensive passive atelectasis in the right lung, predominately in the right lower lobe.  No definite consolidative airspace disease is identified  at this time.  No definite suspicious appearing pulmonary nodule or mass is noted. Left lung is well-aerated.  Trace left pleural effusion.  Upper Abdomen: Unremarkable.  Musculoskeletal: There are no aggressive appearing lytic or blastic lesions noted in the visualized portions of the skeleton.  IMPRESSION: 1.  There is a large complex multi locular right-sided pleural effusion.  Differential considerations include an empyema and a malignant pleural effusion.  Sampling of pleural fluid is recommended. 2.  No current airspace consolidation in the right lung identified at this time to suggest the presence of a pneumonia. 3.  Trace left pleural effusion. 4.  Atherosclerosis, including left main and three-vessel coronary artery disease. 5.  Additional incidental findings, as above.   Original Report Authenticated By: Trudie Reed, M.D.       Recent Lab Findings: Lab Results  Component Value Date   WBC 13.0* 07/12/2012   HGB 11.2* 07/12/2012   HCT 31.6* 07/12/2012   PLT 451* 07/12/2012   GLUCOSE 125* 07/12/2012   CHOL 129 09/07/2011   TRIG 127.0 09/07/2011   HDL 38.40* 09/07/2011   LDLCALC 65 09/07/2011   ALT 41 07/07/2012   AST 32 07/07/2012   NA 137 07/12/2012   K 4.1 07/12/2012   CL 100 07/12/2012   CREATININE 0.93 07/12/2012   BUN 12 07/12/2012   CO2 27 07/12/2012   TSH 3.44 05/27/2012   INR 2.34* 07/13/2012      Assessment / Plan:  Right-sided pleural effusion in the setting of Coumadin use for atrial fibrillation. We will place a chest tube. Depending on  clinical course he may require further surgical intervention.     I have reviewed history and films with Dr Lucious Groves and have recommended to the patient that we proceed with rt chest tube placement.  Delight Ovens MD  Beeper [phone] Office [phone]    07/13/2012 10:09 AM

## 2012-07-13 NOTE — Procedures (Addendum)
Chest Tube Insertion Procedure Note  Indications:  Clinically significant Effusion  Pre-operative Diagnosis: Effusion  Post-operative Diagnosis: Effusion  Procedure Details  Informed consent was obtained for the procedure, including sedation.  Risks of lung perforation, hemorrhage, arrhythmia, and adverse drug reaction were discussed.  After marking appropriate SIDE and performing timeout, 15 mL's of 1% lidocaine was infiltrated locally and with blood pressure monitoring and O2 sat monitoring patient was given IV morphine 4 mg and Versed 1mg  After sterile skin prep, using standard technique, a 28 French tube was placed in the right lateral 6 rib space.  Findings: 1100 ml of dark bloody fluid obtained  Estimated Blood Loss:  Minimal         Specimens:  Sent rt pleural fluid              Complications:  None; patient tolerated the procedure well.         Disposition: stable in room         Condition: stable  Attending Attestation: I performed the procedure.

## 2012-07-13 NOTE — Progress Notes (Signed)
ANTIBIOTIC CONSULT NOTE - follow up  Pharmacy Consult for Vancomycin Indication: pneumonia  Allergies  Allergen Reactions  . Antihistamines, Diphenhydramine-Type Other (See Comments)    Causes difficulty in ability to urinate.    Patient Measurements: Height: 5\' 7"  (170.2 cm) Weight: 160 lb 9.6 oz (72.848 kg) IBW/kg (Calculated) : 66.1   Vital Signs: Temp: 98.6 F (37 C) (05/07 1405) Temp src: Axillary (05/07 1405) BP: 117/65 mmHg (05/07 1405) Pulse Rate: 73 (05/07 1405) Intake/Output from previous day: 05/06 0701 - 05/07 0700 In: 970 [P.O.:720; IV Piggyback:250] Out: 2575 [Urine:2575] Intake/Output from this shift: Total I/O In: 960 [P.O.:960] Out: -   Labs:  Recent Labs  07/04/12 0807 07/06/12 0750  HGB  --  12.8*  HCT  --  36.6*  PLT  --  372  INR 2.6  --   CREATININE  --  0.98    Recent Labs  07/11/12 0438 07/12/12 0450  WBC 11.6* 13.0*  HGB 11.1* 11.2*  PLT 382 451*  CREATININE 0.89 0.93   Estimated Creatinine Clearance: 58.2 ml/min (by C-G formula based on Cr of 0.93).  Recent Labs  07/13/12 1616  VANCOTROUGH 16.8     Microbiology: Recent Results (from the past 720 hour(s))  SURGICAL PCR SCREEN     Status: None   Collection Time    06/15/12  9:13 AM      Result Value Range Status   MRSA, PCR NEGATIVE  NEGATIVE Final   Staphylococcus aureus NEGATIVE  NEGATIVE Final   Comment:            The Xpert SA Assay (FDA     approved for NASAL specimens     in patients over 36 years of age),     is one component of     a comprehensive surveillance     program.  Test performance has     been validated by [employer] for patients greater     than or equal to 37 year old.     It is not intended     to diagnose infection nor to     guide or monitor treatment.  MRSA PCR SCREENING     Status: None   Collection Time    06/15/12  8:18 PM      Result Value Range Status   MRSA by PCR NEGATIVE  NEGATIVE Final   Comment:            The  GeneXpert MRSA Assay (FDA     approved for NASAL specimens     only), is one component of a     comprehensive MRSA colonization     surveillance program. It is not     intended to diagnose MRSA     infection nor to guide or     monitor treatment for     MRSA infections.  CULTURE, BLOOD (ROUTINE X 2)     Status: None   Collection Time    07/06/12 11:00 AM      Result Value Range Status   Specimen Description BLOOD RIGHT ANTECUBITAL   Final   Special Requests BOTTLES DRAWN AEROBIC ONLY 5CC   Final   Culture  Setup Time 07/06/2012 16:16   Final   Culture NO GROWTH 5 DAYS   Final   Report Status 07/12/2012 FINAL   Final  CULTURE, BLOOD (ROUTINE X 2)     Status: None   Collection Time    07/06/12 11:15 AM  Result Value Range Status   Specimen Description BLOOD HAND RIGHT   Final   Special Requests BOTTLES DRAWN AEROBIC ONLY 5CC   Final   Culture  Setup Time 07/06/2012 16:15   Final   Culture NO GROWTH 5 DAYS   Final   Report Status 07/12/2012 FINAL   Final  BODY FLUID CULTURE     Status: None   Collection Time    07/08/12 11:06 AM      Result Value Range Status   Specimen Description FLUID RIGHT PLEURAL   Final   Special Requests Normal   Final   Gram Stain     Final   Value: NO WBC SEEN     NO ORGANISMS SEEN   Culture NO GROWTH 3 DAYS   Final   Report Status 07/11/2012 FINAL   Final    Medical History: Past Medical History  Diagnosis Date  . Coronary artery disease   . Cardiomyopathy primary-nonischemic EF 45%  . Hyperlipidemia   . GERD (gastroesophageal reflux disease)   . Polymyalgia rheumatica   . Sick sinus syndrome   . Atrial fibrillation   . Dysphagia   . Hearing loss, mixed, bilateral   . Increased prostate specific antigen (PSA) velocity   . Lumbar back pain   . BPH (benign prostatic hypertrophy)   . Breast mass     "on both sides" (06/28/2012)  . CHF (congestive heart failure)   . Elevated liver enzymes   . Esophageal stricture   . Pacemaker   .  Arthritis     "some" (06/28/2012)  . Pacemaker infection     06/28/12    Medications:  Prescriptions prior to admission  Medication Sig Dispense Refill  . acetaminophen (TYLENOL) 500 MG tablet Take 500 mg by mouth daily as needed for pain.      Marland Kitchen amiodarone (PACERONE) 200 MG tablet Take 1 tablet (200 mg total) by mouth daily.  90 tablet  3  . carvedilol (COREG) 12.5 MG tablet Take 12.5 mg by mouth 2 (two) times daily with a meal.      . digoxin (LANOXIN) 0.25 MG tablet Take 0.5 tablets (125 mcg total) by mouth daily.  90 tablet  3  . finasteride (PROSCAR) 5 MG tablet Take 5 mg by mouth 4 (four) times a week. Monday, Tuesday, Thursday, Saturday      . fluticasone (FLONASE) 50 MCG/ACT nasal spray Place 2 sprays into the nose daily.  16 g  3  . magnesium oxide (MAG-OX) 400 MG tablet Take 400 mg by mouth daily.      . Multiple Vitamins-Minerals (MULTIVITAMIN WITH MINERALS) tablet Take 1 tablet by mouth daily.        . pantoprazole (PROTONIX) 40 MG tablet Take 40 mg by mouth 4 (four) times a week. Monday, Tuesday, Thursday and Saturday      . pravastatin (PRAVACHOL) 80 MG tablet Take 1 tablet (80 mg total) by mouth daily.  90 tablet  3  . [DISCONTINUED] warfarin (COUMADIN) 5 MG tablet Take 2.5-5 mg by mouth daily. 1 tablet (5 mg) on Monday, Wednesday and Friday, 1/2 tablet (2.5 mg) on all other days       Scheduled:  . amiodarone  200 mg Oral Daily  . benzonatate  100 mg Oral TID  . carvedilol  12.5 mg Oral BID WC  . digoxin  125 mcg Oral Daily  . docusate sodium  100 mg Oral BID  . finasteride  5 mg Oral 4 times weekly  .  fluticasone  2 spray Each Nare Daily  . magnesium oxide  400 mg Oral Daily  . [COMPLETED] midazolam      . pantoprazole  40 mg Oral 4 times weekly  . piperacillin-tazobactam (ZOSYN)  IV  3.375 g Intravenous Q8H  . pravastatin  80 mg Oral q1800  . sodium chloride  3 mL Intravenous Q12H  . vancomycin  1,000 mg Intravenous Q12H  . [DISCONTINUED] warfarin  2.5 mg Oral  ONCE-1800  . [DISCONTINUED] Warfarin - Pharmacist Dosing Inpatient   Does not apply q1800   Assessment: Pt on vanc/zosyn for PNA. Vanc trough came back 16.8 today which is therapeutic for PNA.   Goal of Therapy:  Vancomycin trough level 15-20 mcg/ml   Plan:   Cont vanc 1g IV q12

## 2012-07-13 NOTE — Progress Notes (Signed)
ANTICOAGULATION CONSULT NOTE - Follow Up Consult  Pharmacy Consult:  Coumadin Indication:  History of AFib  Allergies  Allergen Reactions  . Antihistamines, Diphenhydramine-Type Other (See Comments)    Causes difficulty in ability to urinate.   Labs:  Recent Labs  07/11/12 0438 07/12/12 0450 07/13/12 0500  HGB 11.1* 11.2*  --   HCT 31.8* 31.6*  --   PLT 382 451*  --   LABPROT 30.0* 29.6* 24.6*  INR 3.06* 3.01* 2.34*  CREATININE 0.89 0.93  --     Estimated Creatinine Clearance: 58.2 ml/min (by C-G formula based on Cr of 0.93).   Assessment: 34 YOM with history of chronic afib continuing on coumadin.  Coumadin was held on 07/07/12 for thoracentesis on 07/09/11.  INR today is 2.34 with trend down.  PTA dose of coumadin was 5 mg on qMWF and 2.5mg  qTTSS. Continues on amiodarone 200mg  po daily as on PTA.   Also on vancomycin / zosyn  for concern on PNA. Noted Chest CT 5/6 with large complex multilocular right-sided pleural effusion with the differential diagnoses including an empyema and malignant pleural effusion. Pulmonary  to see.  Cefepime 4/30 >> 5/2 Vanc 4/30 >> Zosyn 5/2 >>  4/30 BC x 2 -neg 5/2 plerual fluid -neg   Goal of Therapy:  INR 2-3 Monitor platelets by anticoagulation protocol: Yes  Plan:  - Coumadin 2.5 mg po x 1 dose today - Daily INR -Will check a vancomycin trough today  Harland German, Pharm D 07/13/2012 9:28 AM

## 2012-07-14 ENCOUNTER — Telehealth: Payer: Self-pay | Admitting: *Deleted

## 2012-07-14 ENCOUNTER — Ambulatory Visit: Payer: Medicare Other

## 2012-07-14 ENCOUNTER — Inpatient Hospital Stay (HOSPITAL_COMMUNITY): Payer: Medicare Other

## 2012-07-14 DIAGNOSIS — J90 Pleural effusion, not elsewhere classified: Secondary | ICD-10-CM

## 2012-07-14 DIAGNOSIS — Z950 Presence of cardiac pacemaker: Secondary | ICD-10-CM

## 2012-07-14 LAB — PROTIME-INR: INR: 2.2 — ABNORMAL HIGH (ref 0.00–1.49)

## 2012-07-14 LAB — PATHOLOGIST SMEAR REVIEW: Path Review: INCREASED

## 2012-07-14 NOTE — Progress Notes (Addendum)
Subjective:  [name] states he is doing okay.  He does complain of some incisional soreness at the site of his pacemaker and when he coughs at his chest tube site.  Objective: Vital signs in last 24 hours: Temp:  [98.3 F (36.8 C)-99.9 F (37.7 C)] 98.3 F (36.8 C) (05/08 0359) Pulse Rate:  [68-74] 70 (05/08 0359) Cardiac Rhythm:  [-] Atrial fibrillation (05/08 0359) Resp:  [16-18] 18 (05/08 0359) BP: (111-134)/(48-67) 126/58 mmHg (05/08 0359) SpO2:  [91 %-98 %] 93 % (05/08 0359) Weight:  [155 lb 10.3 oz (70.6 kg)] 155 lb 10.3 oz (70.6 kg) (05/08 0359)  Intake/Output from previous day: 05/07 0701 - 05/08 0700 In: 1500 [P.O.:1200; IV Piggyback:300] Out: 2050 [Urine:1500; Chest Tube:550]  General appearance: alert, cooperative and no distress Heart: regular rate and rhythm and paced Lungs: diminished breath sounds RLL Abdomen: soft, non-tender; bowel sounds normal; no masses,  no organomegaly Wound: clean and dry  Lab Results:  Recent Labs  07/12/12 0450  WBC 13.0*  HGB 11.2*  HCT 31.6*  PLT 451*   BMET:  Recent Labs  07/12/12 0450  NA 137  K 4.1  CL 100  CO2 27  GLUCOSE 125*  BUN 12  CREATININE 0.93  CALCIUM 8.6    PT/INR:  Recent Labs  07/14/12 0500  LABPROT 23.5*  INR 2.20*   ABG No results found for this basename: phart, pco2, po2, hco3, tco2, acidbasedef, o2sat   CBG (last 3)  No results found for this basename: GLUCAP,  in the last 72 hours  Assessment/Plan:  1. S/P Right sided chest tube placement- no air leak present, CXR with no pneumothorax improvement of effusion, drainage of 1600 cc since tube placement will leave on suction today 2. Pain control- currently on Tylenol, will add Ultram for additional relief 3. Dispo- plan per primary, repeat CXR in AM   LOS: 8 days    BARRETT, ERIN 07/14/2012  I have seen and examined [name] and agree with the above assessment  and plan.  Delight Ovens MD Beeper  [phone] Office [phone] 07/14/2012 4:59 PM

## 2012-07-14 NOTE — Progress Notes (Signed)
[name] 1929-03-18 LOS 8  Date 07/13/12     [facility]  Brief history/narrative. The patient was admitted on 07/06/2012 with pleuritic chest pain. He has chronic atrial fibrillation and is chronically anticoagulated with Coumadin. He had a recent hospitalization from 422 2014 through 07/01/2012 for pacemaker system infection. This was complicated by presumed microperforation of the RA and RV leads requiring lead revision on 06/29/2012 and 06/30/2012 by Dr. Sharrell Ku. He was treated with antibiotics for approximately one week. When he presented on 07/06/2012 with pleuritic chest pain, his chest x-ray revealed increase in the size of right pleural effusion and right base infiltrate versus atelectasis. His white blood cell count was elevated at 13 and he was borderline febrile at 99.7. He was started on healthcare associated antibiotics. Ultrasound of his chest was ordered to assess the pleural effusion. When it revealed a large effusion, interventional radiologist performed a thoracentesis on 07/08/2012 yielding 1.5 L of bloody fluid. Fluid culture is negative to date. Fluid protein was 3.8 and fluid LDH was 1831.  The patient received IV antibiotics throughout admission - 7 days of iv Vanc and Zosyn .A CT of his chest ordered on 07/12/12 revealed a large complex multilocular right-sided pleural effusion. Chest tube placed on 5/7 by Dr. Tyrone Sage - 1100 cc of blody fluid drained.   Recent pacemaker lead microperf, status post extraction of previous pacemaker and insertion of another. Followed by cardiologist Dr. Ladona Ridgel.  Nonischemic cardiomyopathy and hypertension. Currently stable. ProBNP is modestly elevated. Continue digoxin, Coreg, statin.  Chronic atrial fibrillation. Currently stable and controlled. Anticoagulated. Digoxin level in the therapeutic range. Continue amiodarone, digoxin, and carvedilol  Mild hepatic transaminitis. Resolved.       Subjective: No new complains    Objective: Vital signs in last 24 hours: Filed Vitals:   07/13/12 1948 07/14/12 0359 07/14/12 1038 07/14/12 1415  BP: 114/56 126/58 125/62 110/54  Pulse: 68 70 77 70  Temp: 99.9 F (37.7 C) 98.3 F (36.8 C)  98.3 F (36.8 C)  TempSrc: Oral Oral  Oral  Resp: 18 18  18   Height:      Weight:  70.6 kg (155 lb 10.3 oz)    SpO2: 92% 93%  93%   Patient Vitals for the past 24 hrs:  BP Temp Temp src Pulse Resp SpO2 Weight  07/14/12 1415 110/54 mmHg 98.3 F (36.8 C) Oral 70 18 93 % -  07/14/12 1038 125/62 mmHg - - 77 - - -  07/14/12 0359 126/58 mmHg 98.3 F (36.8 C) Oral 70 18 93 % 70.6 kg (155 lb 10.3 oz)  07/13/12 1948 114/56 mmHg 99.9 F (37.7 C) Oral 68 18 92 % -     Intake/Output Summary (Last 24 hours) at 07/14/12 1606 Last data filed at 07/14/12 1424  Gross per 24 hour  Intake   1000 ml  Output   2450 ml  Net  -1450 ml    Weight change:   Physical exam: General: Pleasant 79 year old Caucasian man in no acute distress Lungs: Decreased breath sounds on the right with rare crackles. and clear on the left. Heart: Irregular, irregular. Abdomen: Positive bowel sounds, soft, nontender, nondistended. Extremities: No pedal edema.  Lab Results: Basic Metabolic Panel:  Recent Labs  29/56/21 0450  NA 137  K 4.1  CL 100  CO2 27  GLUCOSE 125*  BUN 12  CREATININE 0.93  CALCIUM 8.6   Liver Function Tests: No results found for this basename: AST, ALT, ALKPHOS, BILITOT, PROT,  ALBUMIN,  in the last 72 hours No results found for this basename: LIPASE, AMYLASE,  in the last 72 hours No results found for this basename: AMMONIA,  in the last 72 hours CBC:  Recent Labs  07/12/12 0450  WBC 13.0*  HGB 11.2*  HCT 31.6*  MCV 81.7  PLT 451*   Cardiac Enzymes: No results found for this basename: CKTOTAL, CKMB, CKMBINDEX, TROPONINI,  in the last 72 hours BNP: No results found for this basename: PROBNP,  in the last 72 hours D-Dimer: No results found for this  basename: DDIMER,  in the last 72 hours CBG: No results found for this basename: GLUCAP,  in the last 72 hours Hemoglobin A1C: No results found for this basename: HGBA1C,  in the last 72 hours Fasting Lipid Panel: No results found for this basename: CHOL, HDL, LDLCALC, TRIG, CHOLHDL, LDLDIRECT,  in the last 72 hours Thyroid Function Tests: No results found for this basename: TSH, T4TOTAL, FREET4, T3FREE, THYROIDAB,  in the last 72 hours Anemia Panel: No results found for this basename: VITAMINB12, FOLATE, FERRITIN, TIBC, IRON, RETICCTPCT,  in the last 72 hours Coagulation:  Recent Labs  07/13/12 0500 07/14/12 0500  LABPROT 24.6* 23.5*  INR 2.34* 2.20*   Urine Drug Screen: Drugs of Abuse  No results found for this basename: labopia,  cocainscrnur,  labbenz,  amphetmu,  thcu,  labbarb    Alcohol Level: No results found for this basename: ETH,  in the last 72 hours Urinalysis: No results found for this basename: COLORURINE, APPERANCEUR, LABSPEC, PHURINE, GLUCOSEU, HGBUR, BILIRUBINUR, KETONESUR, PROTEINUR, UROBILINOGEN, NITRITE, LEUKOCYTESUR,  in the last 72 hours Misc. Labs:   Micro: Recent Results (from the past 240 hour(s))  CULTURE, BLOOD (ROUTINE X 2)     Status: None   Collection Time    07/06/12 11:00 AM      Result Value Range Status   Specimen Description BLOOD RIGHT ANTECUBITAL   Final   Special Requests BOTTLES DRAWN AEROBIC ONLY 5CC   Final   Culture  Setup Time 07/06/2012 16:16   Final   Culture NO GROWTH 5 DAYS   Final   Report Status 07/12/2012 FINAL   Final  CULTURE, BLOOD (ROUTINE X 2)     Status: None   Collection Time    07/06/12 11:15 AM      Result Value Range Status   Specimen Description BLOOD HAND RIGHT   Final   Special Requests BOTTLES DRAWN AEROBIC ONLY 5CC   Final   Culture  Setup Time 07/06/2012 16:15   Final   Culture NO GROWTH 5 DAYS   Final   Report Status 07/12/2012 FINAL   Final  BODY FLUID CULTURE     Status: None   Collection Time     07/08/12 11:06 AM      Result Value Range Status   Specimen Description FLUID RIGHT PLEURAL   Final   Special Requests Normal   Final   Gram Stain     Final   Value: NO WBC SEEN     NO ORGANISMS SEEN   Culture NO GROWTH 3 DAYS   Final   Report Status 07/11/2012 FINAL   Final  CULTURE, BLOOD (ROUTINE X 2)     Status: None   Collection Time    07/12/12  6:46 PM      Result Value Range Status   Specimen Description BLOOD LEFT ARM   Final   Special Requests BOTTLES DRAWN AEROBIC ONLY 3CC  Final   Culture  Setup Time 07/13/2012 01:22   Final   Culture     Final   Value:        BLOOD CULTURE RECEIVED NO GROWTH TO DATE CULTURE WILL BE HELD FOR 5 DAYS BEFORE ISSUING A FINAL NEGATIVE REPORT   Report Status PENDING   Incomplete  CULTURE, BLOOD (ROUTINE X 2)     Status: None   Collection Time    07/12/12  6:51 PM      Result Value Range Status   Specimen Description BLOOD LEFT HAND   Final   Special Requests BOTTLES DRAWN AEROBIC ONLY 10CC   Final   Culture  Setup Time 07/13/2012 01:22   Final   Culture     Final   Value:        BLOOD CULTURE RECEIVED NO GROWTH TO DATE CULTURE WILL BE HELD FOR 5 DAYS BEFORE ISSUING A FINAL NEGATIVE REPORT   Report Status PENDING   Incomplete  BODY FLUID CULTURE     Status: None   Collection Time    07/13/12  1:01 PM      Result Value Range Status   Specimen Description FLUID RIGHT PLEURAL   Final   Special Requests FLUID   Final   Gram Stain     Final   Value: FEW WBC PRESENT, PREDOMINANTLY PMN     NO ORGANISMS SEEN   Culture NO GROWTH 1 DAY   Final   Report Status PENDING   Incomplete    Studies/Results: Dg Chest Port 1 View  07/14/2012  *RADIOLOGY REPORT*  Clinical Data: Right chest tube, pleural effusion  PORTABLE CHEST - 1 VIEW  Comparison: 07/13/2012, 07/12/2012  Findings: Right base chest tube is stable and position.  Small residual right pleural effusion, possibly loculated.  Stable heart size and vascularity.  Left subclavian pacer  evident.  The left lung remains clear.  No pneumothorax evident.  IMPRESSION: Overall stable residual small right effusion, possibly loculated. Stable right base chest tube.  No pneumothorax.   Original Report Authenticated By: [name]. Miles Costain, M.D.    Dg Chest Port 1 View  07/13/2012  *RADIOLOGY REPORT*  Clinical Data: Right pleural effusion.  Right-sided chest tube insertion.  PORTABLE CHEST - 1 VIEW  Comparison: Chest x-ray dated 07/10/2012 and chest CT dated 07/12/2012  Findings: Right-sided chest tube has been inserted and there has been almost complete evacuation of the right pleural effusion. There is some loculated fluid along the fissures and posteriorly.  No pneumothorax.  Triple lead pacer in place.  Heart size and pulmonary vascularity are normal.  Left lung is clear.  IMPRESSION: Marked decrease in the right pleural effusion after chest tube insertion.   Original Report Authenticated By: Francene Boyers, M.D.     Medications:  Scheduled: . amiodarone  200 mg Oral Daily  . benzonatate  100 mg Oral TID  . carvedilol  12.5 mg Oral BID WC  . digoxin  125 mcg Oral Daily  . docusate sodium  100 mg Oral BID  . finasteride  5 mg Oral 4 times weekly  . fluticasone  2 spray Each Nare Daily  . magnesium oxide  400 mg Oral Daily  . pantoprazole  40 mg Oral 4 times weekly  . pravastatin  80 mg Oral q1800  . sodium chloride  3 mL Intravenous Q12H   Continuous:  ZOX:[id number], alum & mag hydroxide-simeth, HYDROcodone-acetaminophen, hydrocortisone cream, levalbuterol, morphine injection, ondansetron (ZOFRAN) IV, ondansetron  Assessment: Principal Problem:  Pleuritic chest pain Active Problems:   Atrial fibrillation   SICK SINUS SYNDROME   Nonischemic cardiomyopathy   HCAP (healthcare-associated pneumonia)   Pleural effusion on right   Pacemaker infection   Transaminitis    LOS: 8 days   Harshika Mago 07/14/2012, 4:06 PM

## 2012-07-14 NOTE — Telephone Encounter (Signed)
i'll see him

## 2012-07-14 NOTE — Progress Notes (Signed)
ANTICOAGULATION CONSULT NOTE - Follow Up Consult  Pharmacy Consult:  Coumadin Indication:  History of AFib  Allergies  Allergen Reactions  . Antihistamines, Diphenhydramine-Type Other (See Comments)    Causes difficulty in ability to urinate.   Labs:  Recent Labs  07/12/12 0450 07/13/12 0500 07/14/12 0500  HGB 11.2*  --   --   HCT 31.6*  --   --   PLT 451*  --   --   LABPROT 29.6* 24.6* 23.5*  INR 3.01* 2.34* 2.20*  CREATININE 0.93  --   --     Estimated Creatinine Clearance: 58.2 ml/min (by C-G formula based on Cr of 0.93).   Assessment: 53 YOM with history of chronic afib continuing on coumadin.  Coumadin was held on 07/07/12 for thoracentesis on 07/09/11.  INR today is 2.2 and coumadin held 5/7 for CT placement. Spoke with Dr. Quillian Quince and plans to hold comadin today and resume once CT removed (likely on 07/16/11).  PTA dose of coumadin was 5 mg on qMWF and 2.5mg  qTTSS. Continues on amiodarone 200mg  po daily as on PTA.    Goal of Therapy:  INR 2-3 Monitor platelets by anticoagulation protocol: Yes  Plan:  - Hold coumadin today as discusses with MD - Daily INR  Harland German, Pharm D 07/14/2012 9:18 AM

## 2012-07-14 NOTE — Telephone Encounter (Signed)
Pt wanted Dr Cato Mulligan to know that he is in the hospital with Pneumonia.  He was wondering if he should follow up with Dr Cato Mulligan when he gets out and I explained Transitional Care to him and that Melchor Amour, LPN will be calling him to follow up with him and schedule an appt.  He verbalized understanding and had no questions.

## 2012-07-15 ENCOUNTER — Inpatient Hospital Stay (HOSPITAL_COMMUNITY): Payer: Medicare Other

## 2012-07-15 DIAGNOSIS — J90 Pleural effusion, not elsewhere classified: Secondary | ICD-10-CM

## 2012-07-15 LAB — CBC
MCH: 28.2 pg (ref 26.0–34.0)
Platelets: 494 10*3/uL — ABNORMAL HIGH (ref 150–400)
RBC: 3.8 MIL/uL — ABNORMAL LOW (ref 4.22–5.81)
WBC: 7.4 10*3/uL (ref 4.0–10.5)

## 2012-07-15 LAB — BASIC METABOLIC PANEL
Calcium: 9.1 mg/dL (ref 8.4–10.5)
GFR calc non Af Amer: 79 mL/min — ABNORMAL LOW (ref >90)
Sodium: 138 mEq/L (ref 135–145)

## 2012-07-15 LAB — PROTIME-INR: INR: 1.82 — ABNORMAL HIGH (ref 0.00–1.49)

## 2012-07-15 MED ORDER — WARFARIN - PHARMACIST DOSING INPATIENT: Freq: Every day | Status: DC

## 2012-07-15 MED ORDER — WARFARIN SODIUM 5 MG PO TABS
5.0000 mg | ORAL_TABLET | Freq: Once | ORAL | Status: AC
  Administered 2012-07-15: 5 mg via ORAL
  Filled 2012-07-15: qty 1

## 2012-07-15 NOTE — Progress Notes (Signed)
Sah[place] chamber changed.  130 serosanguinous fluid drained since 0700, see flowsheet.  Tube clamped less that 3 secs during procedure.  20cm continuous wall suction applied.  Will con't plan of care.

## 2012-07-15 NOTE — Progress Notes (Signed)
ANTICOAGULATION CONSULT NOTE - Follow Up Consult  Pharmacy Consult:  Coumadin Indication:  History of AFib  Allergies  Allergen Reactions  . Antihistamines, Diphenhydramine-Type Other (See Comments)    Causes difficulty in ability to urinate.   Labs:  Recent Labs  07/13/12 0500 07/14/12 0500 07/15/12 0500 07/15/12 0911  HGB  --   --   --  10.7*  HCT  --   --   --  31.9*  PLT  --   --   --  494*  LABPROT 24.6* 23.5* 20.4*  --   INR 2.34* 2.20* 1.82*  --   CREATININE  --   --  0.86  --     Estimated Creatinine Clearance: 63 ml/min (by C-G formula based on Cr of 0.86).   Assessment: 17 YOM with history of chronic afib continuing on coumadin.  Coumadin was held on 07/07/12 for thoracentesis on 07/09/11.  INR today is sub-therapeutic, 1.82 and coumadin held 5/7 for CT placement. Noted CT still in place, plans for one more day. Per Dr. Dennie Maizes note, plan is to resume Coumadin today at daily dose. PTA dose of coumadin was 5 mg on q MWF and 2.5mg  q TTSS. Continues on amiodarone 200mg  po daily as on PTA.   Goal of Therapy:  INR 2-3 Monitor platelets by anticoagulation protocol: Yes  Plan:  - Coumadin 5mg  PO x 1 today - Will f/up daily INR  Thanks, Ardith Test K. Allena Katz, PharmD, BCPS.  Clinical Pharmacist Pager [phone]. 07/15/2012 10:43 AM

## 2012-07-15 NOTE — Progress Notes (Addendum)
[address]       [name] [address]             [phone]         Subjective: Feels better, not short of breath  Objective   Temp:  [98.3 F (36.8 C)-98.6 F (37 C)] 98.4 F (36.9 C) (05/09 0350) Pulse Rate:  [70-77] 70 (05/09 0350) Resp:  [18] 18 (05/09 0350) BP: (110-125)/(54-62) 118/61 mmHg (05/09 0350) SpO2:  [93 %-95 %] 94 % (05/09 0350) Weight:  [155 lb 3.3 oz (70.4 kg)] 155 lb 3.3 oz (70.4 kg) (05/09 0350)   Intake/Output Summary (Last 24 hours) at 07/15/12 0732 Last data filed at 07/15/12 0622  Gross per 24 hour  Intake    583 ml  Output   2100 ml  Net  -1517 ml       General appearance: alert, cooperative and no distress Heart: regular rate and rhythm Lungs: mildly dim in right base  Lab Results:  Recent Labs  07/15/12 0500  NA 138  K 4.4  CL 103  CO2 26  GLUCOSE 114*  BUN 15  CREATININE 0.86  CALCIUM 9.1   No results found for this basename: AST, ALT, ALKPHOS, BILITOT, PROT, ALBUMIN,  in the last 72 hours No results found for this basename: LIPASE, AMYLASE,  in the last 72 hours No results found for this basename: WBC, NEUTROABS, HGB, HCT, MCV, PLT,  in the last 72 hours No results found for this basename: CKTOTAL, CKMB, TROPONINI,  in the last 72 hours No components found with this basename: POCBNP,  No results found for this basename: DDIMER,  in the last 72 hours No results found for this basename: HGBA1C,  in the last 72 hours No results found for this basename: CHOL, HDL, LDLCALC, TRIG, CHOLHDL,  in the last 72 hours No results found for this basename: TSH, T4TOTAL, FREET3, T3FREE, THYROIDAB,  in the last 72 hours No results found for this basename: VITAMINB12, FOLATE, FERRITIN, TIBC, IRON, RETICCTPCT,  in the last 72 hours  Medications: Scheduled . amiodarone  200 mg Oral Daily  . benzonatate  100 mg Oral TID  . carvedilol  12.5 mg Oral BID WC  . digoxin  125 mcg Oral Daily  . docusate sodium  100 mg Oral BID    . finasteride  5 mg Oral 4 times weekly  . fluticasone  2 spray Each Nare Daily  . magnesium oxide  400 mg Oral Daily  . pantoprazole  40 mg Oral 4 times weekly  . pravastatin  80 mg Oral q1800  . sodium chloride  3 mL Intravenous Q12H     Radiology/Studies:  Dg Chest Port 1 View  07/14/2012  *RADIOLOGY REPORT*  Clinical Data: Right chest tube, pleural effusion  PORTABLE CHEST - 1 VIEW  Comparison: 07/13/2012, 07/12/2012  Findings: Right base chest tube is stable and position.  Small residual right pleural effusion, possibly loculated.  Stable heart size and vascularity.  Left subclavian pacer evident.  The left lung remains clear.  No pneumothorax evident.  IMPRESSION: Overall stable residual small right effusion, possibly loculated. Stable right base chest tube.  No pneumothorax.   Original Report Authenticated By: [name]. Miles Costain, M.D.    Dg Chest Port 1 View  07/13/2012  *RADIOLOGY REPORT*  Clinical Data: Right pleural effusion.  Right-sided chest tube insertion.  PORTABLE CHEST - 1 VIEW  Comparison: Chest x-ray dated 07/10/2012 and chest CT dated 07/12/2012  Findings: Right-sided chest  tube has been inserted and there has been almost complete evacuation of the right pleural effusion. There is some loculated fluid along the fissures and posteriorly.  No pneumothorax.  Triple lead pacer in place.  Heart size and pulmonary vascularity are normal.  Left lung is clear.  IMPRESSION: Marked decrease in the right pleural effusion after chest tube insertion.   Original Report Authenticated By: Francene Boyers, M.D.     Chest tube 275 cc recorded yesterday. No air leak     INR:1.82 Will add last result for INR, ABG once components are confirmed Will add last 4 CBG results once components are confirmed  Assessment/Plan: S/P right chest tube for bloody effusion, most recent cultures remain negative. Would probably benefit from one more additional day of the tube as CXR conts to clear of the effusion and  the drainage is moderate.    LOS: 9 days    GOLD,WAYNE E 5/9/20147:32 AM  Plan to d/c chest tube tomorrow, poss home Sunday Resume coumadin at daily dose today I have seen and examined Tacey Ruiz and agree with the above assessment  and plan.  Delight Ovens MD Beeper [phone] Office [phone] 07/15/2012 9:34 AM

## 2012-07-15 NOTE — Progress Notes (Signed)
[name] 02/18/1930 LOS 9  Date 07/13/12     [facility]  Brief history/narrative. The patient was admitted on 07/06/2012 with pleuritic chest pain. He has chronic atrial fibrillation and is chronically anticoagulated with Coumadin. He had a recent hospitalization from 422 2014 through 07/01/2012 for pacemaker system infection. This was complicated by presumed microperforation of the RA and RV leads requiring lead revision on 06/29/2012 and 06/30/2012 by Dr. Sharrell Ku. He was treated with antibiotics for approximately one week. When he presented on 07/06/2012 with pleuritic chest pain, his chest x-ray revealed increase in the size of right pleural effusion and right base infiltrate versus atelectasis. His white blood cell count was elevated at 13 and he was borderline febrile at 99.7. He was started on healthcare associated antibiotics. Ultrasound of his chest was ordered to assess the pleural effusion. When it revealed a large effusion, interventional radiologist performed a thoracentesis on 07/08/2012 yielding 1.5 L of bloody fluid. Fluid culture is negative to date. Fluid protein was 3.8 and fluid LDH was 1831.  The patient received IV antibiotics throughout admission - 7 days of iv Vanc and Zosyn .A CT of his chest ordered on 07/12/12 revealed a large complex multilocular right-sided pleural effusion. Chest tube placed on 5/7 by Dr. Tyrone Sage - 1100 cc of blody fluid drained.   Recent pacemaker lead microperf, status post extraction of previous pacemaker and insertion of another. Followed by cardiologist Dr. Ladona Ridgel.  Nonischemic cardiomyopathy and hypertension. Currently stable. ProBNP is modestly elevated. Continue digoxin, Coreg, statin.  Chronic atrial fibrillation. Currently stable and controlled. Anticoagulated. Digoxin level in the therapeutic range. Continue amiodarone, digoxin, and carvedilol  Mild hepatic transaminitis. Resolved.       Subjective: No new complains    Objective: Vital signs in last 24 hours: Filed Vitals:   07/14/12 1415 07/14/12 1952 07/15/12 0350 07/15/12 1330  BP: 110/54 111/57 118/61 118/63  Pulse: 70 70 70 68  Temp: 98.3 F (36.8 C) 98.6 F (37 C) 98.4 F (36.9 C) 98.1 F (36.7 C)  TempSrc: Oral Oral Oral Oral  Resp: 18 18 18 18   Height:      Weight:   70.4 kg (155 lb 3.3 oz)   SpO2: 93% 95% 94% 96%   Patient Vitals for the past 24 hrs:  BP Temp Temp src Pulse Resp SpO2 Weight  07/15/12 1330 118/63 mmHg 98.1 F (36.7 C) Oral 68 18 96 % -  07/15/12 0350 118/61 mmHg 98.4 F (36.9 C) Oral 70 18 94 % 70.4 kg (155 lb 3.3 oz)  07/14/12 1952 111/57 mmHg 98.6 F (37 C) Oral 70 18 95 % -     Intake/Output Summary (Last 24 hours) at 07/15/12 1646 Last data filed at 07/15/12 1600  Gross per 24 hour  Intake    723 ml  Output   2306 ml  Net  -1583 ml    Weight change: -0.2 kg (-7.1 oz)  Physical exam: General: Pleasant 79 year old Caucasian man in no acute distress Lungs: Decreased breath sounds on the right with rare crackles. and clear on the left. Heart: Irregular, irregular. Abdomen: Positive bowel sounds, soft, nontender, nondistended. Extremities: No pedal edema.  Lab Results: Basic Metabolic Panel:  Recent Labs  16/10/96 0500  NA 138  K 4.4  CL 103  CO2 26  GLUCOSE 114*  BUN 15  CREATININE 0.86  CALCIUM 9.1   Liver Function Tests: No results found for this basename: AST, ALT, ALKPHOS, BILITOT, PROT, ALBUMIN,  in the  last 72 hours No results found for this basename: LIPASE, AMYLASE,  in the last 72 hours No results found for this basename: AMMONIA,  in the last 72 hours CBC:  Recent Labs  07/15/12 0911  WBC 7.4  HGB 10.7*  HCT 31.9*  MCV 83.9  PLT 494*   Cardiac Enzymes: No results found for this basename: CKTOTAL, CKMB, CKMBINDEX, TROPONINI,  in the last 72 hours BNP: No results found for this basename: PROBNP,  in the last 72 hours D-Dimer: No results found for this basename:  DDIMER,  in the last 72 hours CBG: No results found for this basename: GLUCAP,  in the last 72 hours Hemoglobin A1C: No results found for this basename: HGBA1C,  in the last 72 hours Fasting Lipid Panel: No results found for this basename: CHOL, HDL, LDLCALC, TRIG, CHOLHDL, LDLDIRECT,  in the last 72 hours Thyroid Function Tests: No results found for this basename: TSH, T4TOTAL, FREET4, T3FREE, THYROIDAB,  in the last 72 hours Anemia Panel: No results found for this basename: VITAMINB12, FOLATE, FERRITIN, TIBC, IRON, RETICCTPCT,  in the last 72 hours Coagulation:  Recent Labs  07/14/12 0500 07/15/12 0500  LABPROT 23.5* 20.4*  INR 2.20* 1.82*   Urine Drug Screen: Drugs of Abuse  No results found for this basename: labopia,  cocainscrnur,  labbenz,  amphetmu,  thcu,  labbarb    Alcohol Level: No results found for this basename: ETH,  in the last 72 hours Urinalysis: No results found for this basename: COLORURINE, APPERANCEUR, LABSPEC, PHURINE, GLUCOSEU, HGBUR, BILIRUBINUR, KETONESUR, PROTEINUR, UROBILINOGEN, NITRITE, LEUKOCYTESUR,  in the last 72 hours Misc. Labs:   Micro: Recent Results (from the past 240 hour(s))  CULTURE, BLOOD (ROUTINE X 2)     Status: None   Collection Time    07/06/12 11:00 AM      Result Value Range Status   Specimen Description BLOOD RIGHT ANTECUBITAL   Final   Special Requests BOTTLES DRAWN AEROBIC ONLY 5CC   Final   Culture  Setup Time 07/06/2012 16:16   Final   Culture NO GROWTH 5 DAYS   Final   Report Status 07/12/2012 FINAL   Final  CULTURE, BLOOD (ROUTINE X 2)     Status: None   Collection Time    07/06/12 11:15 AM      Result Value Range Status   Specimen Description BLOOD HAND RIGHT   Final   Special Requests BOTTLES DRAWN AEROBIC ONLY 5CC   Final   Culture  Setup Time 07/06/2012 16:15   Final   Culture NO GROWTH 5 DAYS   Final   Report Status 07/12/2012 FINAL   Final  BODY FLUID CULTURE     Status: None   Collection Time    07/08/12  11:06 AM      Result Value Range Status   Specimen Description FLUID RIGHT PLEURAL   Final   Special Requests Normal   Final   Gram Stain     Final   Value: NO WBC SEEN     NO ORGANISMS SEEN   Culture NO GROWTH 3 DAYS   Final   Report Status 07/11/2012 FINAL   Final  CULTURE, BLOOD (ROUTINE X 2)     Status: None   Collection Time    07/12/12  6:46 PM      Result Value Range Status   Specimen Description BLOOD LEFT ARM   Final   Special Requests BOTTLES DRAWN AEROBIC ONLY 3CC   Final  Culture  Setup Time 07/13/2012 01:22   Final   Culture     Final   Value:        BLOOD CULTURE RECEIVED NO GROWTH TO DATE CULTURE WILL BE HELD FOR 5 DAYS BEFORE ISSUING A FINAL NEGATIVE REPORT   Report Status PENDING   Incomplete  CULTURE, BLOOD (ROUTINE X 2)     Status: None   Collection Time    07/12/12  6:51 PM      Result Value Range Status   Specimen Description BLOOD LEFT HAND   Final   Special Requests BOTTLES DRAWN AEROBIC ONLY 10CC   Final   Culture  Setup Time 07/13/2012 01:22   Final   Culture     Final   Value:        BLOOD CULTURE RECEIVED NO GROWTH TO DATE CULTURE WILL BE HELD FOR 5 DAYS BEFORE ISSUING A FINAL NEGATIVE REPORT   Report Status PENDING   Incomplete  BODY FLUID CULTURE     Status: None   Collection Time    07/13/12  1:01 PM      Result Value Range Status   Specimen Description FLUID RIGHT PLEURAL   Final   Special Requests FLUID   Final   Gram Stain     Final   Value: FEW WBC PRESENT, PREDOMINANTLY PMN     NO ORGANISMS SEEN   Culture NO GROWTH 2 DAYS   Final   Report Status PENDING   Incomplete    Studies/Results: Dg Chest Port 1 View  07/15/2012  *RADIOLOGY REPORT*  Clinical Data: Right pleural effusion.  PORTABLE CHEST - 1 VIEW  Comparison: 07/14/2012  Findings: Upright portable view of the chest was obtained.  Stable position of the right chest tube.  Slightly decreased parenchymal densities at the right lung base.  Minimal change in the right pleural based  densities.  Left lung remains clear.  Stable appearance of the cardiac pacemaker.  Heart size is normal and stable.  IMPRESSION: Slightly improved aeration at the right lung base.  Stable position of the right chest tube without a large pneumothorax.   Original Report Authenticated By: Richarda Overlie, M.D.    Dg Chest Port 1 View  07/14/2012  *RADIOLOGY REPORT*  Clinical Data: Right chest tube, pleural effusion  PORTABLE CHEST - 1 VIEW  Comparison: 07/13/2012, 07/12/2012  Findings: Right base chest tube is stable and position.  Small residual right pleural effusion, possibly loculated.  Stable heart size and vascularity.  Left subclavian pacer evident.  The left lung remains clear.  No pneumothorax evident.  IMPRESSION: Overall stable residual small right effusion, possibly loculated. Stable right base chest tube.  No pneumothorax.   Original Report Authenticated By: [name]. Miles Costain, M.D.     Medications:  Scheduled: . amiodarone  200 mg Oral Daily  . benzonatate  100 mg Oral TID  . carvedilol  12.5 mg Oral BID WC  . digoxin  125 mcg Oral Daily  . docusate sodium  100 mg Oral BID  . finasteride  5 mg Oral 4 times weekly  . fluticasone  2 spray Each Nare Daily  . magnesium oxide  400 mg Oral Daily  . pantoprazole  40 mg Oral 4 times weekly  . pravastatin  80 mg Oral q1800  . sodium chloride  3 mL Intravenous Q12H  . warfarin  5 mg Oral ONCE-1800  . Warfarin - Pharmacist Dosing Inpatient   Does not apply q1800   Continuous:  MWU:[id number],  alum & mag hydroxide-simeth, HYDROcodone-acetaminophen, hydrocortisone cream, levalbuterol, morphine injection, ondansetron (ZOFRAN) IV, ondansetron  Assessment: Principal Problem:   Pleuritic chest pain Active Problems:   Atrial fibrillation   SICK SINUS SYNDROME   Nonischemic cardiomyopathy   HCAP (healthcare-associated pneumonia)   Pleural effusion on right   Pacemaker infection   Transaminitis    LOS: 9 days   Twania Bujak 07/15/2012, 4:46  PM

## 2012-07-16 LAB — BODY FLUID CULTURE: Culture: NO GROWTH

## 2012-07-16 LAB — PROTIME-INR: Prothrombin Time: 18.9 seconds — ABNORMAL HIGH (ref 11.6–15.2)

## 2012-07-16 MED ORDER — WARFARIN SODIUM 5 MG PO TABS
5.0000 mg | ORAL_TABLET | Freq: Once | ORAL | Status: AC
  Administered 2012-07-16: 5 mg via ORAL
  Filled 2012-07-16: qty 1

## 2012-07-16 NOTE — Progress Notes (Signed)
ANTICOAGULATION CONSULT NOTE - Follow Up Consult  Pharmacy Consult:  Coumadin Indication:  History of AFib  Allergies  Allergen Reactions  . Antihistamines, Diphenhydramine-Type Other (See Comments)    Causes difficulty in ability to urinate.   Labs:  Recent Labs  07/14/12 0500 07/15/12 0500 07/15/12 0911 07/16/12 0525  HGB  --   --  10.7*  --   HCT  --   --  31.9*  --   PLT  --   --  494*  --   LABPROT 23.5* 20.4*  --  18.9*  INR 2.20* 1.82*  --  1.64*  CREATININE  --  0.86  --   --     Estimated Creatinine Clearance: 63 ml/min (by C-G formula based on Cr of 0.86).   Assessment: 48 YOM with history of chronic afib continuing on coumadin.  Coumadin was held on 07/07/12 for thoracentesis on 07/09/11.  INR today is sub-therapeutic, 1.82 and coumadin held 5/7 for CT placement. Noted CT still in place, plans for one more day.   Coumadin was resumed yesterday and plan today is removal of chest tube.  He continues on amiodarone 200mg  po daily as on PTA.   Slight drop in hemoglobin but no noted bleeding complications.  Home Dose:  Warfarin 5 mg q MWF and 2.5mg  q TTSS.    Goal of Therapy:  INR 2-3 Monitor platelets by anticoagulation protocol: Yes  Plan:  - Coumadin 5mg  PO x 1 today - Will f/up daily INR  Nadara Mustard, PharmD., MS Clinical Pharmacist Pager:  [phone] Thank you for allowing pharmacy to be part of this patients care team. 07/16/2012 12:06 PM

## 2012-07-16 NOTE — Progress Notes (Signed)
[name] [birth year] LOS 10  Date 07/13/12     [facility]  Brief history/narrative. The patient was admitted on 07/06/2012 with pleuritic chest pain. He has chronic atrial fibrillation and is chronically anticoagulated with Coumadin. He had a recent hospitalization from 422 2014 through 07/01/2012 for pacemaker system infection. This was complicated by presumed microperforation of the RA and RV leads requiring lead revision on 06/29/2012 and 06/30/2012 by Dr. Sharrell Ku. He was treated with antibiotics for approximately one week. When he presented on 07/06/2012 with pleuritic chest pain, his chest x-ray revealed increase in the size of right pleural effusion and right base infiltrate versus atelectasis. His white blood cell count was elevated at 13 and he was borderline febrile at 99.7. He was started on healthcare associated antibiotics. Ultrasound of his chest was ordered to assess the pleural effusion. When it revealed a large effusion, interventional radiologist performed a thoracentesis on 07/08/2012 yielding 1.5 L of bloody fluid. Fluid culture is negative to date. Fluid protein was 3.8 and fluid LDH was 1831.  The patient received IV antibiotics throughout admission - 7 days of iv Vanc and Zosyn .A CT of his chest ordered on 07/12/12 revealed a large complex multilocular right-sided pleural effusion. Chest tube placed on 5/7 by Dr. Tyrone Sage - 1100 cc of blody fluid drained.   Recent pacemaker lead microperf, status post extraction of previous pacemaker and insertion of another. Followed by cardiologist Dr. Ladona Ridgel.  Nonischemic cardiomyopathy and hypertension. Currently stable. ProBNP is modestly elevated. Continue digoxin, Coreg, statin.  Chronic atrial fibrillation. Currently stable and controlled. Anticoagulated. Digoxin level in the therapeutic range. Continue amiodarone, digoxin, and carvedilol  Mild hepatic transaminitis. Resolved.       Subjective: No new complains    Objective: Vital signs in last 24 hours: Filed Vitals:   07/15/12 0350 07/15/12 1330 07/15/12 1940 07/16/12 0408  BP: 118/61 118/63 131/70 131/77  Pulse: 70 68 73 79  Temp: 98.4 F (36.9 C) 98.1 F (36.7 C) 98 F (36.7 C) 98.5 F (36.9 C)  TempSrc: Oral Oral Oral Oral  Resp: 18 18 18 18   Height:      Weight: 70.4 kg (155 lb 3.3 oz)   70.7 kg (155 lb 13.8 oz)  SpO2: 94% 96% 92% 97%   Patient Vitals for the past 24 hrs:  BP Temp Temp src Pulse Resp SpO2 Weight  07/16/12 0408 131/77 mmHg 98.5 F (36.9 C) Oral 79 18 97 % 70.7 kg (155 lb 13.8 oz)  07/15/12 1940 131/70 mmHg 98 F (36.7 C) Oral 73 18 92 % -  07/15/12 1330 118/63 mmHg 98.1 F (36.7 C) Oral 68 18 96 % -     Intake/Output Summary (Last 24 hours) at 07/16/12 0730 Last data filed at 07/16/12 6962  Gross per 24 hour  Intake    483 ml  Output   2341 ml  Net  -1858 ml    Weight change: 0.3 kg (10.6 oz)  Physical exam: General: Pleasant 79 year old Caucasian man in no acute distress Lungs: Decreased breath sounds on the right with rare crackles. and clear on the left. Heart: Irregular, irregular. Abdomen: Positive bowel sounds, soft, nontender, nondistended. Extremities: No pedal edema.  Lab Results: Basic Metabolic Panel:  Recent Labs  95/28/41 0500  NA 138  K 4.4  CL 103  CO2 26  GLUCOSE 114*  BUN 15  CREATININE 0.86  CALCIUM 9.1   Liver Function Tests: No results found for this basename: AST, ALT, ALKPHOS, BILITOT,  PROT, ALBUMIN,  in the last 72 hours No results found for this basename: LIPASE, AMYLASE,  in the last 72 hours No results found for this basename: AMMONIA,  in the last 72 hours CBC:  Recent Labs  07/15/12 0911  WBC 7.4  HGB 10.7*  HCT 31.9*  MCV 83.9  PLT 494*   Cardiac Enzymes: No results found for this basename: CKTOTAL, CKMB, CKMBINDEX, TROPONINI,  in the last 72 hours BNP: No results found for this basename: PROBNP,  in the last 72 hours D-Dimer: No results found  for this basename: DDIMER,  in the last 72 hours CBG: No results found for this basename: GLUCAP,  in the last 72 hours Hemoglobin A1C: No results found for this basename: HGBA1C,  in the last 72 hours Fasting Lipid Panel: No results found for this basename: CHOL, HDL, LDLCALC, TRIG, CHOLHDL, LDLDIRECT,  in the last 72 hours Thyroid Function Tests: No results found for this basename: TSH, T4TOTAL, FREET4, T3FREE, THYROIDAB,  in the last 72 hours Anemia Panel: No results found for this basename: VITAMINB12, FOLATE, FERRITIN, TIBC, IRON, RETICCTPCT,  in the last 72 hours Coagulation:  Recent Labs  07/15/12 0500 07/16/12 0525  LABPROT 20.4* 18.9*  INR 1.82* 1.64*   Urine Drug Screen: Drugs of Abuse  No results found for this basename: labopia,  cocainscrnur,  labbenz,  amphetmu,  thcu,  labbarb    Alcohol Level: No results found for this basename: ETH,  in the last 72 hours Urinalysis: No results found for this basename: COLORURINE, APPERANCEUR, LABSPEC, PHURINE, GLUCOSEU, HGBUR, BILIRUBINUR, KETONESUR, PROTEINUR, UROBILINOGEN, NITRITE, LEUKOCYTESUR,  in the last 72 hours Misc. Labs:   Micro: Recent Results (from the past 240 hour(s))  CULTURE, BLOOD (ROUTINE X 2)     Status: None   Collection Time    07/06/12 11:00 AM      Result Value Range Status   Specimen Description BLOOD RIGHT ANTECUBITAL   Final   Special Requests BOTTLES DRAWN AEROBIC ONLY 5CC   Final   Culture  Setup Time 07/06/2012 16:16   Final   Culture NO GROWTH 5 DAYS   Final   Report Status 07/12/2012 FINAL   Final  CULTURE, BLOOD (ROUTINE X 2)     Status: None   Collection Time    07/06/12 11:15 AM      Result Value Range Status   Specimen Description BLOOD HAND RIGHT   Final   Special Requests BOTTLES DRAWN AEROBIC ONLY 5CC   Final   Culture  Setup Time 07/06/2012 16:15   Final   Culture NO GROWTH 5 DAYS   Final   Report Status 07/12/2012 FINAL   Final  BODY FLUID CULTURE     Status: None    Collection Time    07/08/12 11:06 AM      Result Value Range Status   Specimen Description FLUID RIGHT PLEURAL   Final   Special Requests Normal   Final   Gram Stain     Final   Value: NO WBC SEEN     NO ORGANISMS SEEN   Culture NO GROWTH 3 DAYS   Final   Report Status 07/11/2012 FINAL   Final  CULTURE, BLOOD (ROUTINE X 2)     Status: None   Collection Time    07/12/12  6:46 PM      Result Value Range Status   Specimen Description BLOOD LEFT ARM   Final   Special Requests BOTTLES DRAWN AEROBIC ONLY 3CC  Final   Culture  Setup Time 07/13/2012 01:22   Final   Culture     Final   Value:        BLOOD CULTURE RECEIVED NO GROWTH TO DATE CULTURE WILL BE HELD FOR 5 DAYS BEFORE ISSUING A FINAL NEGATIVE REPORT   Report Status PENDING   Incomplete  CULTURE, BLOOD (ROUTINE X 2)     Status: None   Collection Time    07/12/12  6:51 PM      Result Value Range Status   Specimen Description BLOOD LEFT HAND   Final   Special Requests BOTTLES DRAWN AEROBIC ONLY 10CC   Final   Culture  Setup Time 07/13/2012 01:22   Final   Culture     Final   Value:        BLOOD CULTURE RECEIVED NO GROWTH TO DATE CULTURE WILL BE HELD FOR 5 DAYS BEFORE ISSUING A FINAL NEGATIVE REPORT   Report Status PENDING   Incomplete  BODY FLUID CULTURE     Status: None   Collection Time    07/13/12  1:01 PM      Result Value Range Status   Specimen Description FLUID RIGHT PLEURAL   Final   Special Requests FLUID   Final   Gram Stain     Final   Value: FEW WBC PRESENT, PREDOMINANTLY PMN     NO ORGANISMS SEEN   Culture NO GROWTH 2 DAYS   Final   Report Status PENDING   Incomplete    Studies/Results: Dg Chest Port 1 View  07/15/2012  *RADIOLOGY REPORT*  Clinical Data: Right pleural effusion.  PORTABLE CHEST - 1 VIEW  Comparison: 07/14/2012  Findings: Upright portable view of the chest was obtained.  Stable position of the right chest tube.  Slightly decreased parenchymal densities at the right lung base.  Minimal change  in the right pleural based densities.  Left lung remains clear.  Stable appearance of the cardiac pacemaker.  Heart size is normal and stable.  IMPRESSION: Slightly improved aeration at the right lung base.  Stable position of the right chest tube without a large pneumothorax.   Original Report Authenticated By: Richarda Overlie, M.D.     Medications:  Scheduled: . amiodarone  200 mg Oral Daily  . benzonatate  100 mg Oral TID  . carvedilol  12.5 mg Oral BID WC  . digoxin  125 mcg Oral Daily  . docusate sodium  100 mg Oral BID  . finasteride  5 mg Oral 4 times weekly  . fluticasone  2 spray Each Nare Daily  . magnesium oxide  400 mg Oral Daily  . pantoprazole  40 mg Oral 4 times weekly  . pravastatin  80 mg Oral q1800  . sodium chloride  3 mL Intravenous Q12H  . Warfarin - Pharmacist Dosing Inpatient   Does not apply q1800   Continuous:  HYQ:[id number], alum & mag hydroxide-simeth, HYDROcodone-acetaminophen, hydrocortisone cream, levalbuterol, morphine injection, ondansetron (ZOFRAN) IV, ondansetron  Assessment: Principal Problem:   Pleuritic chest pain Active Problems:   Atrial fibrillation   SICK SINUS SYNDROME   Nonischemic cardiomyopathy   HCAP (healthcare-associated pneumonia)   Pleural effusion on right   Pacemaker infection   Transaminitis    LOS: 10 days   Shandon Burlingame 07/16/2012, 7:30 AM

## 2012-07-16 NOTE — Progress Notes (Signed)
Patient chest tube removed per MD order.  Chest tube removed with no sutures left to tie after removal.  Vaseline gauze dressing applied.  Patient tolerated procedure well. Will continue to monitor patient

## 2012-07-16 NOTE — Progress Notes (Addendum)
Subjective: Continues to feel well, only 60 cc out since 18:30 yesterday  Objective   Temp:  [98 F (36.7 C)-98.5 F (36.9 C)] 98.5 F (36.9 C) (05/10 0408) Pulse Rate:  [68-79] 79 (05/10 0408) Resp:  [18] 18 (05/10 0408) BP: (118-131)/(63-77) 131/77 mmHg (05/10 0408) SpO2:  [92 %-97 %] 97 % (05/10 0408) Weight:  [155 lb 13.8 oz (70.7 kg)] 155 lb 13.8 oz (70.7 kg) (05/10 0408)   Intake/Output Summary (Last 24 hours) at 07/16/12 0827 Last data filed at 07/16/12 0700  Gross per 24 hour  Intake    483 ml  Output   2541 ml  Net  -2058 ml       General appearance: alert, cooperative and no distress Heart: regular rate and rhythm Lungs: clear to auscultation bilaterally  Lab Results:  Recent Labs  07/15/12 0500  NA 138  K 4.4  CL 103  CO2 26  GLUCOSE 114*  BUN 15  CREATININE 0.86  CALCIUM 9.1   No results found for this basename: AST, ALT, ALKPHOS, BILITOT, PROT, ALBUMIN,  in the last 72 hours No results found for this basename: LIPASE, AMYLASE,  in the last 72 hours  Recent Labs  07/15/12 0911  WBC 7.4  HGB 10.7*  HCT 31.9*  MCV 83.9  PLT 494*   No results found for this basename: CKTOTAL, CKMB, TROPONINI,  in the last 72 hours No components found with this basename: POCBNP,  No results found for this basename: DDIMER,  in the last 72 hours No results found for this basename: HGBA1C,  in the last 72 hours No results found for this basename: CHOL, HDL, LDLCALC, TRIG, CHOLHDL,  in the last 72 hours No results found for this basename: TSH, T4TOTAL, FREET3, T3FREE, THYROIDAB,  in the last 72 hours No results found for this basename: VITAMINB12, FOLATE, FERRITIN, TIBC, IRON, RETICCTPCT,  in the last 72 hours  Medications: Scheduled . amiodarone  200 mg Oral Daily  . benzonatate  100 mg Oral TID  . carvedilol  12.5 mg Oral BID WC  . digoxin  125 mcg Oral Daily  . docusate sodium  100 mg Oral BID  . finasteride  5 mg Oral 4 times weekly  .  fluticasone  2 spray Each Nare Daily  . magnesium oxide  400 mg Oral Daily  . pantoprazole  40 mg Oral 4 times weekly  . pravastatin  80 mg Oral q1800  . sodium chloride  3 mL Intravenous Q12H  . Warfarin - Pharmacist Dosing Inpatient   Does not apply q1800     Radiology/Studies:  Dg Chest Port 1 View  07/15/2012  *RADIOLOGY REPORT*  Clinical Data: Right pleural effusion.  PORTABLE CHEST - 1 VIEW  Comparison: 07/14/2012  Findings: Upright portable view of the chest was obtained.  Stable position of the right chest tube.  Slightly decreased parenchymal densities at the right lung base.  Minimal change in the right pleural based densities.  Left lung remains clear.  Stable appearance of the cardiac pacemaker.  Heart size is normal and stable.  IMPRESSION: Slightly improved aeration at the right lung base.  Stable position of the right chest tube without a large pneumothorax.   Original Report Authenticated By: Richarda Overlie, M.D.     INR: Will add last result for INR, ABG once components are confirmed Will add last 4 CBG results once components are confirmed  Assessment/Plan: S/P right chest tube for hemothorax, stable for chest tube removal today. Can  be discharged when medically stable   LOS: 10 days    GOLD,WAYNE E 5/10/20148:27 AM  Patient looks great after chest tube removal Check x-ray in a.m. and patient will be good for discharge if no change.

## 2012-07-17 ENCOUNTER — Inpatient Hospital Stay (HOSPITAL_COMMUNITY): Payer: Medicare Other

## 2012-07-17 LAB — PROTIME-INR
INR: 1.69 — ABNORMAL HIGH (ref 0.00–1.49)
Prothrombin Time: 19.3 seconds — ABNORMAL HIGH (ref 11.6–15.2)

## 2012-07-17 MED ORDER — WARFARIN SODIUM 5 MG PO TABS: 2.5000 mg | ORAL_TABLET | Freq: Every day | ORAL | Status: DC

## 2012-07-17 NOTE — Progress Notes (Signed)
   [name]       [name] [address]             [phone]         Subjective: conts to feel well  Objective   Temp:  [98.4 F (36.9 C)-98.7 F (37.1 C)] 98.5 F (36.9 C) (05/11 0405) Pulse Rate:  [73-78] 76 (05/11 0405) Resp:  [16-20] 20 (05/11 0405) BP: (117-126)/(62-69) 117/62 mmHg (05/11 0405) SpO2:  [93 %-96 %] 96 % (05/11 0405) Weight:  [154 lb 3.2 oz (69.945 kg)] 154 lb 3.2 oz (69.945 kg) (05/11 0405)   Intake/Output Summary (Last 24 hours) at 07/17/12 0837 Last data filed at 07/17/12 0406  Gross per 24 hour  Intake    240 ml  Output   1100 ml  Net   -860 ml       General appearance: alert, cooperative and no distress Heart: regular rate and rhythm and S1, S2 normal Lungs: mildly dim right base  Lab Results:  Recent Labs  07/15/12 0500  NA 138  K 4.4  CL 103  CO2 26  GLUCOSE 114*  BUN 15  CREATININE 0.86  CALCIUM 9.1   No results found for this basename: AST, ALT, ALKPHOS, BILITOT, PROT, ALBUMIN,  in the last 72 hours No results found for this basename: LIPASE, AMYLASE,  in the last 72 hours  Recent Labs  07/15/12 0911  WBC 7.4  HGB 10.7*  HCT 31.9*  MCV 83.9  PLT 494*   No results found for this basename: CKTOTAL, CKMB, TROPONINI,  in the last 72 hours No components found with this basename: POCBNP,  No results found for this basename: DDIMER,  in the last 72 hours No results found for this basename: HGBA1C,  in the last 72 hours No results found for this basename: CHOL, HDL, LDLCALC, TRIG, CHOLHDL,  in the last 72 hours No results found for this basename: TSH, T4TOTAL, FREET3, T3FREE, THYROIDAB,  in the last 72 hours No results found for this basename: VITAMINB12, FOLATE, FERRITIN, TIBC, IRON, RETICCTPCT,  in the last 72 hours  Medications: Scheduled . amiodarone  200 mg Oral Daily  . benzonatate  100 mg Oral TID  . carvedilol  12.5 mg Oral BID WC  . digoxin  125 mcg Oral Daily  . docusate sodium  100 mg Oral  BID  . finasteride  5 mg Oral 4 times weekly  . fluticasone  2 spray Each Nare Daily  . magnesium oxide  400 mg Oral Daily  . pantoprazole  40 mg Oral 4 times weekly  . pravastatin  80 mg Oral q1800  . sodium chloride  3 mL Intravenous Q12H  . Warfarin - Pharmacist Dosing Inpatient   Does not apply q1800     Radiology/Studies:  No results found.  INR: Will add last result for INR, ABG once components are confirmed Will add last 4 CBG results once components are confirmed  Assessment/Plan: S/P right chest tube for hemothorax, chest xray shows only small residual amt of fluid with chest tube out. Stable for discharge from CT surgery standpoint .   LOS: 11 days    [name],[name] 5/11/20148:37 AM

## 2012-07-17 NOTE — Progress Notes (Signed)
Pt discharged per Md order and protocol. Discharge instructions reviewed with patient and all questions answered. Pt aware of all follow up appointments.

## 2012-07-17 NOTE — Discharge Summary (Signed)
Physician Discharge Summary  [name] ZOX:[id number] DOB: [birth year] DOA: 07/06/2012  PCP: [name], MD  Admit date: 07/06/2012 Discharge date: 07/17/2012  Time spent: 35 minutes  Recommendations for Outpatient Follow-up:  1. CXR at the next follow up visit 2. PT/INR in 2-3 days  Discharge Diagnoses:  Hemothorax - s/p chest tube drainage  Anemia   Atrial fibrillation   SICK SINUS SYNDROME - with recent pacemaker explantation for microperf  Pacemaker infection 06/15/2012 s/p explantation    Nonischemic cardiomyopathy   HCAP (healthcare-associated pneumonia) - treated    Discharge Condition: good  Diet recommendation: heart healthy   Filed Weights   07/15/12 0350 07/16/12 0408 07/17/12 0405  Weight: 70.4 kg (155 lb 3.3 oz) 70.7 kg (155 lb 13.8 oz) 69.945 kg (154 lb 3.2 oz)     Hospital Course:  The patient was admitted on 07/06/2012 with pleuritic chest pain. He has chronic atrial fibrillation and is chronically anticoagulated with Coumadin. He had a recent hospitalization from 4/22 2014 through 07/01/2012 for explantation of a temporary pacemaker and implantation of a permanent BiV pacer . This was complicated by presumed microperforation of the RA and RV leads requiring lead revision on 06/29/2012 and 06/30/2012 by Dr. Sharrell Ku. He was treated with antibiotics for approximately one week. When he presented on 07/06/2012 with pleuritic chest pain, his chest x-ray revealed increase in the size of right pleural effusion and right base infiltrate versus atelectasis. His white blood cell count was elevated at 13 and he was borderline febrile at 99.7. He was started on healthcare associated pneumonia antibiotic coverage. Ultrasound of his chest was ordered to assess and found a pleural effusion. Interventional radiologist performed a thoracentesis on 07/08/2012 yielding 1.5 L of bloody fluid. Fluid culture was without bacterial growth. Fluid protein was 3.8 and fluid LDH was 1831.   The patient received IV antibiotics throughout admission - 7 days of iv Vanc and Zosyn .A CT of his chest ordered on 07/12/12 revealed a large complex multilocular right-sided pleural effusion.  Patient was seen in consult by pulmonary and thoracic surgery.   Chest tube placed on 5/7 by Dr. Tyrone Sage - 1100 cc of blody fluid drained. Chest tube was removed on 07/16/12 and patient remained afebrile and ready for Dc on 07/17/12  Nonischemic cardiomyopathy and hypertension. Currently stable. ProBNP is modestly elevated. Continue digoxin, Coreg, statin.   Chronic atrial fibrillation. Currently stable and controlled. Anticoagulated.   Digoxin level in the therapeutic range. Continue amiodarone, digoxin, and carvedilol   Mild hepatic transaminitis. Resolved.      Procedures:  Chest Ct   Thoracentesis  Chest tube placement / removal   Consultations:  TCTS  PCCM  Discharge Exam: Filed Vitals:   07/16/12 0408 07/16/12 1417 07/16/12 1959 07/17/12 0405  BP: 131/77 118/69 126/62 117/62  Pulse: 79 73 78 76  Temp: 98.5 F (36.9 C) 98.4 F (36.9 C) 98.7 F (37.1 C) 98.5 F (36.9 C)  TempSrc: Oral Oral Oral Oral  Resp: 18 16 19 20   Height:      Weight: 70.7 kg (155 lb 13.8 oz)   69.945 kg (154 lb 3.2 oz)  SpO2: 97% 93% 96% 96%    General: axox3 Cardiovascular: rrr Respiratory: ctab   Discharge Instructions  Discharge Orders   Future Appointments Provider Department Dept Phone   07/18/2012 10:30 AM Baker Pierini, FNP [facility] [phone]   07/25/2012 12:00 PM [facility] [phone]   08/03/2012 8:45 AM  [facility] [phone]   08/03/2012 9:00 AM Laurey Morale, MD [facility] Main Office [place]) [phone]   08/04/2012 4:15 PM Leslye Peer, MD [facility] [phone]   10/07/2012 12:00 PM Marinus Maw, MD  [facility] Main Office [place])  [phone]   Future Orders Complete By Expires     Diet - low sodium heart healthy  As directed     Increase activity slowly  As directed         Medication List    TAKE these medications       acetaminophen 500 MG tablet  Commonly known as:  TYLENOL  Take 500 mg by mouth daily as needed for pain.     amiodarone 200 MG tablet  Commonly known as:  PACERONE  Take 1 tablet (200 mg total) by mouth daily.     carvedilol 12.5 MG tablet  Commonly known as:  COREG  Take 12.5 mg by mouth 2 (two) times daily with a meal.     digoxin 0.25 MG tablet  Commonly known as:  LANOXIN  Take 0.5 tablets (125 mcg total) by mouth daily.     finasteride 5 MG tablet  Commonly known as:  PROSCAR  Take 5 mg by mouth 4 (four) times a week. Monday, Tuesday, Thursday, Saturday     fluticasone 50 MCG/ACT nasal spray  Commonly known as:  FLONASE  Place 2 sprays into the nose daily.     HYDROcodone-acetaminophen 5-325 MG per tablet  Commonly known as:  NORCO/VICODIN  Take 1 tablet by mouth every 4 (four) hours as needed.     magnesium oxide 400 MG tablet  Commonly known as:  MAG-OX  Take 400 mg by mouth daily.     multivitamin with minerals tablet  Take 1 tablet by mouth daily.     pantoprazole 40 MG tablet  Commonly known as:  PROTONIX  Take 40 mg by mouth 4 (four) times a week. Monday, Tuesday, Thursday and Saturday     pravastatin 80 MG tablet  Commonly known as:  PRAVACHOL  Take 1 tablet (80 mg total) by mouth daily.     warfarin 5 MG tablet  Commonly known as:  COUMADIN  Take 0.5 tablets (2.5 mg total) by mouth daily. 1 tablet (5 mg) on Monday, Wednesday and Friday, 1/2 tablet (2.5 mg) on all other days       Allergies  Allergen Reactions  . Antihistamines, Diphenhydramine-Type Other (See Comments)    Causes difficulty in ability to urinate.       Follow-up Information   Follow up with [name], MD On 07/18/2012. (at 10:30 AM)    Contact information:   [address]  [place] [address] [phone]       Follow up with Leslye Peer., MD On 08/04/2012. (At 4:15 pm. Pulmonologist)    Contact information:   [address] [place] [address] [phone]       Follow up with GERHARDT,EDWARD B, MD. (office will contact you for suture removal and to see surgeon.)    Contact information:   [address] [place] [address] [phone]        The results of significant diagnostics from this hospitalization (including imaging, microbiology, ancillary and laboratory) are listed below for reference.    Significant Diagnostic Studies: Dg Chest 1 View  07/08/2012  *RADIOLOGY REPORT*  Clinical Data: Post right thoracentesis.  Cough.  CHEST - 1  VIEW  Comparison: 07/06/2012.  Findings: Trachea is midline.  Heart size stable.  Pacemaker lead tips are stable in position.  There is a moderate right pleural effusion, not significantly changed in size from 07/06/2012. Associated right basilar airspace consolidation.  No pneumothorax. Left lung is clear.  IMPRESSION:  1.  No pneumothorax after right thoracentesis. 2.  Minimal change in size of a moderate right pleural effusion with right basilar airspace consolidation.  Follow-up to clearing is recommended.   Original Report Authenticated By: Leanna Battles, M.D.    Dg Chest 1 View  06/28/2012  *RADIOLOGY REPORT*  Clinical Data: Chest pain.  Status post pacer placement.  CHEST - 1 VIEW  Comparison: 06/16/2012  Findings: Left chest wall pacer device is noted with leads in the right atrial appendage, coronary sinus and right ventricle.  No pneumothorax is identified.  There is mild cardiac enlargement. Mild pulmonary venous congestion is noted.  There is opacity in the right base which may represent infiltrate, aspiration or atelectasis.  IMPRESSION:  1.  No pneumothorax after pacer placement. 2.  Right lung base opacity which may represent infiltrate, aspiration or atelectasis. 3.   Pulmonary venous congestion.   Original Report Authenticated By: Signa Kell, M.D.    Dg Chest 2 View  07/17/2012  *RADIOLOGY REPORT*  Clinical Data: Right pleural effusion and status post removal of chest tube.  CHEST - 2 VIEW  Comparison: 07/15/2012  Findings: Stable residual small right pleural effusion with associated right lower lobe atelectasis.  No edema or pneumothorax is identified.  Heart size is stable.  There is stable appearance of a biventricular pacing device.  IMPRESSION: Stable small right pleural effusion.  No pneumothorax.   Original Report Authenticated By: Irish Lack, M.D.    Dg Chest 2 View  07/10/2012  *RADIOLOGY REPORT*  Clinical Data: Shortness of breath.  CHEST - 2 VIEW  Comparison: Jul 08, 2012.  Findings: Stable cardiomediastinal silhouette.  Left-sided pacemaker is unchanged in position.  Left lung is clear.  Moderate size right pleural effusion is again noted and unchanged compared to prior exam with probable underlying pneumonia or atelectasis.  IMPRESSION: No significant change involving moderate right pleural effusion with probable underlying pneumonia or atelectasis.   Original Report Authenticated By: Lupita Raider.,  M.D.    Dg Chest 2 View  07/06/2012  *RADIOLOGY REPORT*  Clinical Data: Right-sided chest pain. Shortness of breath. Fever.  CHEST - 2 VIEW  Comparison: 07/04/2012.  Findings: Biventricular pacer in place with leads unchanged in position.  Increase in size of right-sided pleural effusion.  Right base atelectasis suspected.  Limited for detecting the right base infiltrate or mass.  Central pulmonary vascular prominence.  Limited evaluation of cardiac contour secondary to the right base pleural effusion.  Slightly tortuous calcified aorta.  IMPRESSION: Increase in size of right-sided pleural effusion.  Please see above.   Original Report Authenticated By: Lacy Duverney, M.D.    Dg Chest 2 View  07/04/2012  *RADIOLOGY REPORT*  Clinical Data: Cough and  shortness of breath 1 day following pacemaker insertion on 06/30/2012.  Ex-smoker.  CHEST - 2 VIEW  Comparison: 07/01/2012 at [facility].  Findings: Normal sized heart.  Increased atelectasis at the medial right lung base.  No significant change in small bilateral pleural effusions.  Normal vascularity.  Mild thoracic spine degenerative changes.  Stable left subclavian pacemaker leads.  IMPRESSION:  1.  Increased atelectasis at the medial right lung base. 2.  Stable small bilateral pleural  effusions.   Original Report Authenticated By: Beckie Salts, M.D.    Dg Chest 2 View  07/01/2012  *RADIOLOGY REPORT*  Clinical Data: Status post pacemaker insertion.  Some shortness of breath  CHEST - 2 VIEW  Comparison: 06/30/2012  Findings: Left chest wall pacemaker is present, with stable leads projecting in the right atrium, right ventricle, and coronary sinus.  The heart size appears within normal limits and there is stable.  Thoracic aorta contour is stable.  Pulmonary vascularity is normal.  There is a persistent opacity the medial right lung base.  Both costophrenic angles are blunted posteriorly, suggesting small bilateral pleural effusions.  There is no evidence of pulmonary edema.  Negative for pneumothorax.  No acute osseous abnormality.  IMPRESSION:  1.  Stable appearance of triple lead pacemaker. 2.  Medial right basilar opacity is unchanged and could reflect atelectasis or airspace disease. 3.  Very small bilateral pleural effusions.   Original Report Authenticated By: Britta Mccreedy, M.D.    Dg Chest 2 View  06/30/2012  *RADIOLOGY REPORT*  Clinical Data: Status post pacemaker placement unable to raise arm  CHEST - 2 VIEW  Comparison: 06/29/2012  Findings: The cardiac shadow is stable.  Pacemaker leads are again identified although the atrial lead appears different than that seen on the recent exam from previous day.  Correlation with the patients clinical history is recommended.  Persistent increased  change is noted in the medial aspect of the right lung base consistent with evolving infiltrate.  The left lung remains clear. No pneumothorax is noted.  The humeral head is somewhat high-riding on the left which may be related to an underlying rotator cuff injury.  IMPRESSION: Change in the appearance of the atrial lead when compared with the previous day.  Clinical correlation is recommended.  Increasing right basilar infiltrate.   Original Report Authenticated By: Alcide Clever, M.D.    Dg Chest 2 View  06/29/2012  *RADIOLOGY REPORT*  Clinical Data: Post pacemaker insertion  CHEST - 2 VIEW  Comparison: 06/28/2012  Findings: Left subclavian transvenous pacemaker leads project at right atrium, right ventricle, and coronary sinus. Upper normal heart size. Mediastinal contours and pulmonary vascularity normal. Bibasilar atelectasis and small right subpulmonic pleural effusion. Upper lungs clear. No pneumothorax or acute osseous findings.  IMPRESSION: Bibasilar atelectasis and small right pleural effusion.   Original Report Authenticated By: Ulyses Southward, M.D.    Ct Chest W Contrast  07/12/2012  *RADIOLOGY REPORT*  Clinical Data: Evaluate for pneumonia and right-sided pleural effusion.  CT CHEST WITH CONTRAST  Technique:  Multidetector CT imaging of the chest was performed following the standard protocol during bolus administration of intravenous contrast.  Contrast: 80mL OMNIPAQUE IOHEXOL 300 MG/ML  SOLN  Comparison: No priors.  Chest x-ray 07/10/2012.  Findings:  Mediastinum: Heart size is normal. There is no significant pericardial fluid, thickening or pericardial calcification. There is atherosclerosis of the thoracic aorta, the great vessels of the mediastinum and the coronary arteries, including calcified atherosclerotic plaque in the left main, left anterior descending, left circumflex and right coronary arteries. No pathologically enlarged mediastinal or hilar lymph nodes. Esophagus is unremarkable in  appearance.  A left-sided biventricular pacemaker is noted with lead tips terminating in the right atrial appendage, right ventricular apex and overlying the lateral wall of the left ventricle via the coronary sinus and coronary veins.  Lungs/Pleura: Large complex multilocular right-sided pleural effusion with multiple thin internal septations.  There is either high attenuation debris or enhancing material in the  posterior aspect of the right hemithorax.  Extensive passive atelectasis in the right lung, predominately in the right lower lobe.  No definite consolidative airspace disease is identified at this time.  No definite suspicious appearing pulmonary nodule or mass is noted. Left lung is well-aerated.  Trace left pleural effusion.  Upper Abdomen: Unremarkable.  Musculoskeletal: There are no aggressive appearing lytic or blastic lesions noted in the visualized portions of the skeleton.  IMPRESSION: 1.  There is a large complex multi locular right-sided pleural effusion.  Differential considerations include an empyema and a malignant pleural effusion.  Sampling of pleural fluid is recommended. 2.  No current airspace consolidation in the right lung identified at this time to suggest the presence of a pneumonia. 3.  Trace left pleural effusion. 4.  Atherosclerosis, including left main and three-vessel coronary artery disease. 5.  Additional incidental findings, as above.   Original Report Authenticated By: Trudie Reed, M.D.    [place] Chest  07/07/2012  *RADIOLOGY REPORT*  Clinical Data: Assess for right pleural effusion.  CHEST ULTRASOUND  Comparison: 07/06/2012 chest x-ray.  Findings: There is a moderate-to-large right pleural effusion. This is complex with multiple internal septations noted.  IMPRESSION: Moderate to large complex/septated right pleural effusion.   Original Report Authenticated By: Charlett Nose, M.D.    Dg Chest Port 1 View  07/15/2012  *RADIOLOGY REPORT*  Clinical Data: Right pleural  effusion.  PORTABLE CHEST - 1 VIEW  Comparison: 07/14/2012  Findings: Upright portable view of the chest was obtained.  Stable position of the right chest tube.  Slightly decreased parenchymal densities at the right lung base.  Minimal change in the right pleural based densities.  Left lung remains clear.  Stable appearance of the cardiac pacemaker.  Heart size is normal and stable.  IMPRESSION: Slightly improved aeration at the right lung base.  Stable position of the right chest tube without a large pneumothorax.   Original Report Authenticated By: Richarda Overlie, M.D.    Dg Chest Port 1 View  07/14/2012  *RADIOLOGY REPORT*  Clinical Data: Right chest tube, pleural effusion  PORTABLE CHEST - 1 VIEW  Comparison: 07/13/2012, 07/12/2012  Findings: Right base chest tube is stable and position.  Small residual right pleural effusion, possibly loculated.  Stable heart size and vascularity.  Left subclavian pacer evident.  The left lung remains clear.  No pneumothorax evident.  IMPRESSION: Overall stable residual small right effusion, possibly loculated. Stable right base chest tube.  No pneumothorax.   Original Report Authenticated By: [name]. Miles Costain, M.D.    Dg Chest Port 1 View  07/13/2012  *RADIOLOGY REPORT*  Clinical Data: Right pleural effusion.  Right-sided chest tube insertion.  PORTABLE CHEST - 1 VIEW  Comparison: Chest x-ray dated 07/10/2012 and chest CT dated 07/12/2012  Findings: Right-sided chest tube has been inserted and there has been almost complete evacuation of the right pleural effusion. There is some loculated fluid along the fissures and posteriorly.  No pneumothorax.  Triple lead pacer in place.  Heart size and pulmonary vascularity are normal.  Left lung is clear.  IMPRESSION: Marked decrease in the right pleural effusion after chest tube insertion.   Original Report Authenticated By: Francene Boyers, M.D.    US Thoracentesis Asp Pleural Space W/img Guide  07/08/2012  *RADIOLOGY REPORT*  Clinical Data:   Right pleural effusion  ULTRASOUND GUIDED right THORACENTESIS  Comparison:  None  An ultrasound guided thoracentesis was thoroughly discussed with the patient and questions answered.  The benefits, risks, alternatives  and complications were also discussed.  The patient understands and wishes to proceed with the procedure.  Written consent was obtained.  Ultrasound was performed to localize and mark an adequate pocket of fluid in the right chest.  The area was then prepped and draped in the normal sterile fashion.  1% Lidocaine was used for local anesthesia.  Under ultrasound guidance a 19 gauge Yueh catheter was introduced.  Thoracentesis was performed.  The catheter was removed and a dressing applied.  Complications:  None  Findings: A total of approximately 1.5 liters of bloody fluid was removed. A fluid sample was sent for laboratory analysis.  IMPRESSION: Successful ultrasound guided right thoracentesis yielding 1.5 liters of pleural fluid.  Read by: Ralene Muskrat, P.A.-C   Original Report Authenticated By: [name]. Miles Costain, M.D.     Microbiology: Recent Results (from the past 240 hour(s))  BODY FLUID CULTURE     Status: None   Collection Time    07/08/12 11:06 AM      Result Value Range Status   Specimen Description FLUID RIGHT PLEURAL   Final   Special Requests Normal   Final   Gram Stain     Final   Value: NO WBC SEEN     NO ORGANISMS SEEN   Culture NO GROWTH 3 DAYS   Final   Report Status 07/11/2012 FINAL   Final  CULTURE, BLOOD (ROUTINE X 2)     Status: None   Collection Time    07/12/12  6:46 PM      Result Value Range Status   Specimen Description BLOOD LEFT ARM   Final   Special Requests BOTTLES DRAWN AEROBIC ONLY 3CC   Final   Culture  Setup Time 07/13/2012 01:22   Final   Culture     Final   Value:        BLOOD CULTURE RECEIVED NO GROWTH TO DATE CULTURE WILL BE HELD FOR 5 DAYS BEFORE ISSUING A FINAL NEGATIVE REPORT   Report Status PENDING   Incomplete  CULTURE, BLOOD (ROUTINE X 2)      Status: None   Collection Time    07/12/12  6:51 PM      Result Value Range Status   Specimen Description BLOOD LEFT HAND   Final   Special Requests BOTTLES DRAWN AEROBIC ONLY 10CC   Final   Culture  Setup Time 07/13/2012 01:22   Final   Culture     Final   Value:        BLOOD CULTURE RECEIVED NO GROWTH TO DATE CULTURE WILL BE HELD FOR 5 DAYS BEFORE ISSUING A FINAL NEGATIVE REPORT   Report Status PENDING   Incomplete  BODY FLUID CULTURE     Status: None   Collection Time    07/13/12  1:01 PM      Result Value Range Status   Specimen Description FLUID RIGHT PLEURAL   Final   Special Requests FLUID   Final   Gram Stain     Final   Value: FEW WBC PRESENT, PREDOMINANTLY PMN     NO ORGANISMS SEEN   Culture NO GROWTH 3 DAYS   Final   Report Status 07/16/2012 FINAL   Final     Labs: Basic Metabolic Panel:  Recent Labs Lab 07/11/12 0438 07/12/12 0450 07/15/12 0500  NA 136 137 138  K 4.0 4.1 4.4  CL 100 100 103  CO2 27 27 26   GLUCOSE 116* 125* 114*  BUN 14 12 15  CREATININE 0.89 0.93 0.86  CALCIUM 8.5 8.6 9.1   Liver Function Tests: No results found for this basename: AST, ALT, ALKPHOS, BILITOT, PROT, ALBUMIN,  in the last 168 hours No results found for this basename: LIPASE, AMYLASE,  in the last 168 hours No results found for this basename: AMMONIA,  in the last 168 hours CBC:  Recent Labs Lab 07/11/12 0438 07/12/12 0450 07/15/12 0911  WBC 11.6* 13.0* 7.4  HGB 11.1* 11.2* 10.7*  HCT 31.8* 31.6* 31.9*  MCV 82.4 81.7 83.9  PLT 382 451* 494*   Cardiac Enzymes: No results found for this basename: CKTOTAL, CKMB, CKMBINDEX, TROPONINI,  in the last 168 hours BNP: BNP (last 3 results)  Recent Labs  07/07/12 0425 07/08/12 0902  PROBNP 842.7* 727.3*   CBG: No results found for this basename: GLUCAP,  in the last 168 hours     Signed:  Alanah Sakuma  [facility] 07/17/2012, 9:16 AM

## 2012-07-18 ENCOUNTER — Encounter: Payer: Self-pay | Admitting: Family

## 2012-07-18 ENCOUNTER — Encounter: Payer: Self-pay | Admitting: Internal Medicine

## 2012-07-18 ENCOUNTER — Ambulatory Visit (INDEPENDENT_AMBULATORY_CARE_PROVIDER_SITE_OTHER): Payer: Medicare Other | Admitting: Family

## 2012-07-18 VITALS — BP 112/64 | HR 86 | Wt 158.0 lb

## 2012-07-18 DIAGNOSIS — J90 Pleural effusion, not elsewhere classified: Secondary | ICD-10-CM

## 2012-07-18 DIAGNOSIS — I4891 Unspecified atrial fibrillation: Secondary | ICD-10-CM | POA: Diagnosis not present

## 2012-07-18 NOTE — Progress Notes (Signed)
Subjective:    Patient ID: [name], male    DOB: May 24, 1929, 79 y.o.   MRN: [id number]  HPI 79 year old male, nonsmoker, patient of Dr. Cato Mulligan is in today as a hospital followup. He was seen in the emergency department with chest pain and shortness of breath. He was found to have right pleural effusion. He had a chest tube placed and was discharged from the hospital after being hospitalized 12 days. Today, he feels much better. However, he continues to have some fatigue. Denies any chest pain or shortness of breath. He has a history of atrial fibrillation last PT/INR was 1.69 in the hospital. Patient reports using an additional half tablet at home last night. He scheduled to see the anti-coag clinic [facility] May 28th.   Review of Systems  Constitutional: Positive for fatigue. Negative for chills, diaphoresis and activity change.  HENT: Negative.   Respiratory: Negative.  Negative for shortness of breath and wheezing.   Cardiovascular: Negative.   Gastrointestinal: Negative.   Endocrine: Negative.   Genitourinary: Negative.   Musculoskeletal: Negative.   Allergic/Immunologic: Negative.   Neurological: Negative.   Hematological: Negative.   Psychiatric/Behavioral: Negative.    Past Medical History  Diagnosis Date  . Coronary artery disease   . Cardiomyopathy primary-nonischemic EF 45%  . Hyperlipidemia   . GERD (gastroesophageal reflux disease)   . Polymyalgia rheumatica   . Sick sinus syndrome   . Atrial fibrillation   . Dysphagia   . Hearing loss, mixed, bilateral   . Increased prostate specific antigen (PSA) velocity   . Lumbar back pain   . BPH (benign prostatic hypertrophy)   . Breast mass     "on both sides" (06/28/2012)  . CHF (congestive heart failure)   . Elevated liver enzymes   . Esophageal stricture   . Pacemaker   . Arthritis     "some" (06/28/2012)  . Pacemaker infection     06/28/12    History   Social History  . Marital Status: Married   Spouse Name: N/A    Number of Children: N/A  . Years of Education: N/A   Occupational History  . Not on file.   Social History Main Topics  . Smoking status: Former Smoker -- 1.00 packs/day for 40 years    Types: Cigarettes    Quit date: 03/10/1983  . Smokeless tobacco: Never Used  . Alcohol Use: No  . Drug Use: No  . Sexually Active: Not on file   Other Topics Concern  . Not on file   Social History Narrative  . No narrative on file    Past Surgical History  Procedure Laterality Date  . Cataract extraction w/ intraocular lens  implant, bilateral Bilateral   . Umbilical hernia repair    . Insert / replace / remove pacemaker    . Inguinal hernia repair Right 2012    RIH  . Umbilical hernia repair    . Cardioversion  06/17/2011    Procedure: CARDIOVERSION;  Surgeon: [name], MD;  Location: [facility] OR;  Service: Cardiovascular;  Laterality: N/A;  . Cardioversion  09/09/2011    Procedure: CARDIOVERSION;  Surgeon: Luis Abed, MD;  Location: [facility] OR;  Service: Cardiovascular;  Laterality: N/A;  . Cardioversion  01/01/2012    Procedure: CARDIOVERSION;  Surgeon: Herby Abraham, MD;  Location: [facility] ENDOSCOPY;  Service: Cardiovascular;  Laterality: N/A;  . Tee without cardioversion  01/01/2012    Procedure: TRANSESOPHAGEAL ECHOCARDIOGRAM (TEE);  Surgeon: Sherol Dade  Tenny Craw, MD;  Location: [facility] ENDOSCOPY;  Service: Cardiovascular;  Laterality: N/A;  . Eye surgery    . Generator removal Left 06/15/2012    Procedure: GENERATOR REMOVAL;  Surgeon: Marinus Maw, MD;  Location: [facility] OR;  Service: Cardiovascular;  Laterality: Left;  . Pacemaker generator change N/A 06/15/2012    Procedure: PACEMAKER GENERATOR CHANGE;  Surgeon: Marinus Maw, MD;  Location: [facility] OR;  Service: Cardiovascular;  Laterality: N/A;  . Tonsillectomy and adenoidectomy    . Cardiac catheterization      Family History  Problem Relation Age of Onset  . Heart attack Father 27    deceased  . Aneurysm Mother     deceased  AAA  . Hypertension Mother   . Other Brother     deceased at birth    Allergies  Allergen Reactions  . Antihistamines, Diphenhydramine-Type Other (See Comments)    Causes difficulty in ability to urinate.    Current Outpatient Prescriptions on File Prior to Visit  Medication Sig Dispense Refill  . acetaminophen (TYLENOL) 500 MG tablet Take 500 mg by mouth daily as needed for pain.      Marland Kitchen amiodarone (PACERONE) 200 MG tablet Take 1 tablet (200 mg total) by mouth daily.  90 tablet  3  . carvedilol (COREG) 12.5 MG tablet Take 12.5 mg by mouth 2 (two) times daily with a meal.      . digoxin (LANOXIN) 0.25 MG tablet Take 0.5 tablets (125 mcg total) by mouth daily.  90 tablet  3  . finasteride (PROSCAR) 5 MG tablet Take 5 mg by mouth 4 (four) times a week. Monday, Tuesday, Thursday, Saturday      . fluticasone (FLONASE) 50 MCG/ACT nasal spray Place 2 sprays into the nose daily.  16 g  3  . HYDROcodone-acetaminophen (NORCO/VICODIN) 5-325 MG per tablet Take 1 tablet by mouth every 4 (four) hours as needed.  30 tablet  1  . magnesium oxide (MAG-OX) 400 MG tablet Take 400 mg by mouth daily.      . Multiple Vitamins-Minerals (MULTIVITAMIN WITH MINERALS) tablet Take 1 tablet by mouth daily.        . pantoprazole (PROTONIX) 40 MG tablet Take 40 mg by mouth 4 (four) times a week. Monday, Tuesday, Thursday and Saturday      . pravastatin (PRAVACHOL) 80 MG tablet Take 1 tablet (80 mg total) by mouth daily.  90 tablet  3  . warfarin (COUMADIN) 5 MG tablet Take 0.5 tablets (2.5 mg total) by mouth daily. 1 tablet (5 mg) on Monday, Wednesday and Friday, 1/2 tablet (2.5 mg) on all other days       No current facility-administered medications on file prior to visit.    BP 112/64  Pulse 86  Wt 158 lb (71.668 kg)  BMI 24.74 kg/m2  SpO2 91%chart    Objective:   Physical Exam  Constitutional: He is oriented to person, place, and time. He appears well-developed and well-nourished.  Neck: Normal range of  motion. Neck supple.  Cardiovascular: Normal rate, regular rhythm and normal heart sounds.   Pulmonary/Chest: Effort normal and breath sounds normal.  Abdominal: Soft. Bowel sounds are normal.  Musculoskeletal: Normal range of motion.  Neurological: He is alert and oriented to person, place, and time.  Skin: Skin is warm and dry.  Psychiatric: He has a normal mood and affect.          Assessment & Plan:  Assessment:  1. Right pleural effusion 2. Atrial fibrillation 3.  Coronary artery disease  Plan: Continue current Coumadin dose. Followup with anti-coag clinic scheduled. Patient has an appointment with pulmonology in 2 weeks for pulmonary function test, I have advised that he weighed approximately another month before having that test performed. Continue current medications. Follow with cardiology as scheduled. Call with any questions or concerns. Recheck a schedule of Dr. Cato Mulligan and other specialist.

## 2012-07-18 NOTE — Patient Instructions (Addendum)
Continue on same dosage 1/2 tablet  every day except 1 tablet Mondays, Wednesdays, and Fridays. Recheck in 2 weeks. Follow-up as scheduled with [address]   Anticoagulation Dose Instructions as of 07/18/2012     Glynis Smiles Tue Wed Thu Fri Sat   New Dose 2.5 mg 5 mg 2.5 mg 5 mg 2.5 mg 5 mg 2.5 mg    Description       Continue on same dosage 1/2 tablet  every day except 1 tablet Mondays, Wednesdays, and Fridays. Recheck in 2 weeks. Follow-up as scheduled with [address]

## 2012-07-19 ENCOUNTER — Telehealth: Payer: Self-pay | Admitting: *Deleted

## 2012-07-19 LAB — CULTURE, BLOOD (ROUTINE X 2): Culture: NO GROWTH

## 2012-07-19 NOTE — Telephone Encounter (Signed)
Transitional care management  Admit date: 07/06/2012 Discharge date:07/17/2012  Recommendations for outpatient follow up:  1.cxr at the next follow up visit   2.pt.inr in 2-3-da  Discharge diagnoses: Hemothorax-s/p chest tube drainage Anemia     Atrial fibrillation     Sick sinus syndrome-with recent pacemaker explanation for microperf Pacemaker infection 06/15/2012.      nonishechemic cardiomyopathy     HCAP (healthcare-associated pneumonia)  Treated  Discharge condition:good  Talked with pt and states he is doing fine. He does c/o exertional SOB with some weakness .  Taking his medication as ordered, but he does have feeling of weakness. Appetite is improving daily..  Pt has f/u post hospital   Visit with dr swords on 5/20/2014at 9:30.

## 2012-07-19 NOTE — Telephone Encounter (Signed)
Talked with pt -transitional care-  States he is weak and tired  And has exertional sob.  Do you think he would benefit from pulmonary rehab?  Asked pt if he would be interested in that to recondition him and he said yes.  Please advise

## 2012-07-22 ENCOUNTER — Ambulatory Visit (INDEPENDENT_AMBULATORY_CARE_PROVIDER_SITE_OTHER)
Admission: RE | Admit: 2012-07-22 | Discharge: 2012-07-22 | Disposition: A | Discharge status: Home / Self Care | Payer: Medicare Other | Source: Ambulatory Visit | Attending: Internal Medicine | Admitting: Internal Medicine

## 2012-07-22 ENCOUNTER — Other Ambulatory Visit: Payer: Self-pay | Admitting: *Deleted

## 2012-07-22 ENCOUNTER — Ambulatory Visit (INDEPENDENT_AMBULATORY_CARE_PROVIDER_SITE_OTHER): Payer: Medicare Other | Admitting: *Deleted

## 2012-07-22 DIAGNOSIS — I4891 Unspecified atrial fibrillation: Secondary | ICD-10-CM | POA: Diagnosis not present

## 2012-07-22 DIAGNOSIS — J9819 Other pulmonary collapse: Secondary | ICD-10-CM | POA: Diagnosis not present

## 2012-07-22 DIAGNOSIS — I428 Other cardiomyopathies: Secondary | ICD-10-CM | POA: Diagnosis not present

## 2012-07-22 DIAGNOSIS — J90 Pleural effusion, not elsewhere classified: Secondary | ICD-10-CM

## 2012-07-22 LAB — PACEMAKER DEVICE OBSERVATION
ATRIAL PACING PM: 99
BAMS-0003: 70 {beats}/min
RV LEAD IMPEDENCE PM: 450 Ohm
RV LEAD THRESHOLD: 0.75 V
VENTRICULAR PACING PM: 3.9

## 2012-07-22 NOTE — Progress Notes (Signed)
Wound check-PPM 

## 2012-07-25 ENCOUNTER — Telehealth: Payer: Self-pay | Admitting: Internal Medicine

## 2012-07-25 ENCOUNTER — Ambulatory Visit (INDEPENDENT_AMBULATORY_CARE_PROVIDER_SITE_OTHER): Payer: Medicare Other

## 2012-07-25 DIAGNOSIS — Z4802 Encounter for removal of sutures: Secondary | ICD-10-CM

## 2012-07-25 DIAGNOSIS — I2510 Atherosclerotic heart disease of native coronary artery without angina pectoris: Secondary | ICD-10-CM

## 2012-07-25 DIAGNOSIS — J90 Pleural effusion, not elsewhere classified: Secondary | ICD-10-CM

## 2012-07-25 NOTE — Telephone Encounter (Signed)
New problem    Calling regarding xray results

## 2012-07-25 NOTE — Progress Notes (Signed)
Pt came in office for suture removal. After examining incision sites, there were no sutures in place.  Chest tube site is well healed with no signs of infection.

## 2012-07-26 ENCOUNTER — Encounter: Payer: Self-pay | Admitting: Internal Medicine

## 2012-07-26 ENCOUNTER — Ambulatory Visit (INDEPENDENT_AMBULATORY_CARE_PROVIDER_SITE_OTHER): Payer: Medicare Other | Admitting: Internal Medicine

## 2012-07-26 VITALS — BP 122/68 | HR 72 | Temp 97.9°F | Wt 156.0 lb

## 2012-07-26 DIAGNOSIS — T827XXD Infection and inflammatory reaction due to other cardiac and vascular devices, implants and grafts, subsequent encounter: Secondary | ICD-10-CM

## 2012-07-26 DIAGNOSIS — J90 Pleural effusion, not elsewhere classified: Secondary | ICD-10-CM

## 2012-07-26 DIAGNOSIS — J189 Pneumonia, unspecified organism: Secondary | ICD-10-CM

## 2012-07-26 NOTE — Progress Notes (Signed)
Complicated patient. He was recently hospitalized April 22 through July 01, 2012. He was then readmitted on July 06, 2012 through Jul 17, 2012. Patient's history is been complicated recently. Patient has known atrial flutter relation with sick sinus syndrome. He recently underwent a pacemaker replacement. He had significant trouble with pacemaker infection. Patient also thought to have a hospital-acquired pneumonia which was successfully treated. In addition patient was thought to have a hemothorax which require chest tube placement by thoracic surgery.  This is a transitional care visit. Reviewed transitional care telephone note.  Reviewed past medical history, family history, social history, medications.  Review of systems: Patient remains somewhat fatigued but is improving. Patient denies any fevers. Shortness of breath is improving.  No recurrent fever.   Physical exam elderly male in no acute distress. Neck is supple. Chest clear to auscultation. Cardiac exam S1-S2 are regular. Abdominal exam active bowel sounds, soft. Inspection of the chest tube site and the pacemaker site are not significant for erythema or discharge.

## 2012-07-27 MED ORDER — FUROSEMIDE 20 MG PO TABS: 20.0000 mg | ORAL_TABLET | Freq: Every day | ORAL | Status: DC

## 2012-07-27 NOTE — Telephone Encounter (Signed)
Dr Ladona Ridgel reviewed and we will start Furosemide 20mg  daily.  Patient aware and will have his BMP drawn at the [facility] office next Thurs

## 2012-07-28 NOTE — Assessment & Plan Note (Signed)
resolved 

## 2012-07-28 NOTE — Assessment & Plan Note (Signed)
S/p PM replacement Has f/u with cv

## 2012-07-28 NOTE — Assessment & Plan Note (Signed)
Reviewed recent cxr Note recurrence of pleural effusion Has f/u with cv and tcts

## 2012-07-29 ENCOUNTER — Telehealth: Payer: Self-pay | Admitting: Internal Medicine

## 2012-07-29 NOTE — Telephone Encounter (Signed)
PT called and stated that in order to be accepted into the [facility], he'll need to have a certification on health. He wanted to know if he needed to be seen to have this done. He would like to know what the status of the report to [facility] is, on himself and his wife Nettie Elm. Please assist.

## 2012-08-02 ENCOUNTER — Ambulatory Visit: Payer: Medicare Other | Admitting: Cardiology

## 2012-08-03 ENCOUNTER — Other Ambulatory Visit: Payer: Self-pay | Admitting: *Deleted

## 2012-08-03 ENCOUNTER — Ambulatory Visit: Payer: Medicare Other | Admitting: Cardiology

## 2012-08-03 ENCOUNTER — Ambulatory Visit (INDEPENDENT_AMBULATORY_CARE_PROVIDER_SITE_OTHER): Payer: Medicare Other | Admitting: Pharmacist

## 2012-08-03 DIAGNOSIS — I4891 Unspecified atrial fibrillation: Secondary | ICD-10-CM

## 2012-08-03 DIAGNOSIS — I2510 Atherosclerotic heart disease of native coronary artery without angina pectoris: Secondary | ICD-10-CM

## 2012-08-03 DIAGNOSIS — J90 Pleural effusion, not elsewhere classified: Secondary | ICD-10-CM

## 2012-08-03 LAB — BASIC METABOLIC PANEL
BUN: 12 mg/dL (ref 6–23)
Chloride: 103 mEq/L (ref 96–112)
Potassium: 4.5 mEq/L (ref 3.5–5.1)
Sodium: 137 mEq/L (ref 135–145)

## 2012-08-03 LAB — PROTIME-INR: Prothrombin Time: 65 s (ref 10.2–12.4)

## 2012-08-03 NOTE — Telephone Encounter (Signed)
Form is filled out and ready for Dr Cato Mulligan to sign.  Pt aware Dr Cato Mulligan will not be back till Monday 08/08/12

## 2012-08-04 ENCOUNTER — Ambulatory Visit
Admission: RE | Admit: 2012-08-04 | Discharge: 2012-08-04 | Disposition: A | Discharge status: Home / Self Care | Payer: Medicare Other | Source: Ambulatory Visit | Attending: Cardiothoracic Surgery | Admitting: Cardiothoracic Surgery

## 2012-08-04 ENCOUNTER — Encounter: Payer: Self-pay | Admitting: Emergency Medicine

## 2012-08-04 ENCOUNTER — Encounter: Payer: Self-pay | Admitting: Cardiothoracic Surgery

## 2012-08-04 ENCOUNTER — Ambulatory Visit (INDEPENDENT_AMBULATORY_CARE_PROVIDER_SITE_OTHER): Payer: Medicare Other | Admitting: Cardiothoracic Surgery

## 2012-08-04 ENCOUNTER — Ambulatory Visit (INDEPENDENT_AMBULATORY_CARE_PROVIDER_SITE_OTHER): Payer: Medicare Other | Admitting: Emergency Medicine

## 2012-08-04 VITALS — BP 122/72 | HR 73 | Temp 98.4°F | Ht 66.0 in | Wt 156.0 lb

## 2012-08-04 VITALS — BP 107/70 | HR 100 | Resp 18 | Ht 67.0 in | Wt 156.0 lb

## 2012-08-04 DIAGNOSIS — R0989 Other specified symptoms and signs involving the circulatory and respiratory systems: Secondary | ICD-10-CM

## 2012-08-04 DIAGNOSIS — J90 Pleural effusion, not elsewhere classified: Secondary | ICD-10-CM | POA: Diagnosis not present

## 2012-08-04 DIAGNOSIS — R0600 Dyspnea, unspecified: Secondary | ICD-10-CM | POA: Insufficient documentation

## 2012-08-04 DIAGNOSIS — J942 Hemothorax: Secondary | ICD-10-CM

## 2012-08-04 DIAGNOSIS — Z09 Encounter for follow-up examination after completed treatment for conditions other than malignant neoplasm: Secondary | ICD-10-CM | POA: Diagnosis not present

## 2012-08-04 DIAGNOSIS — I2510 Atherosclerotic heart disease of native coronary artery without angina pectoris: Secondary | ICD-10-CM

## 2012-08-04 DIAGNOSIS — R0609 Other forms of dyspnea: Secondary | ICD-10-CM | POA: Diagnosis not present

## 2012-08-04 DIAGNOSIS — J9819 Other pulmonary collapse: Secondary | ICD-10-CM | POA: Diagnosis not present

## 2012-08-04 NOTE — Assessment & Plan Note (Addendum)
Residual fluid from prior hemothorax. He is currently holding coumadin for a supertherapeutic INR. He would like to schedule thora. Will work on this, arrange for him to get back on coumadin after the provcedure.

## 2012-08-04 NOTE — Progress Notes (Signed)
Subjective:    Patient ID: [name], male    DOB: 22-Aug-1929, 79 y.o.   MRN: [id number]  HPI 79 yo man, former smoker (30pk-yrs), non-ischemic CM, HTN, hx A fib on coumadin s/p pacer placement 4/'14. Post-procedure he was readmitted with pleuritic pain and a r effusion that was drained but that then required chest tube placement (removed 5/10). He has returned to see Dr Tyrone Sage, repeat CXR today >> stable moderate R effusion (no bigger).    Review of Systems  Constitutional: Positive for activity change, appetite change and unexpected weight change. Negative for fever.  HENT: Positive for postnasal drip. Negative for ear pain, nosebleeds, congestion, sore throat, rhinorrhea, sneezing, dental problem and sinus pressure.   Eyes: Negative for redness and itching.  Respiratory: Positive for cough, chest tightness and shortness of breath. Negative for wheezing.   Cardiovascular: Negative for palpitations and leg swelling.  Gastrointestinal: Negative for nausea and vomiting.  Genitourinary: Negative for dysuria.  Musculoskeletal: Negative for joint swelling.  Skin: Negative for rash.  Neurological: Negative for headaches.  Hematological: Does not bruise/bleed easily.  Psychiatric/Behavioral: Negative for dysphoric mood. The patient is not nervous/anxious.     Past Medical History  Diagnosis Date  . Coronary artery disease   . Cardiomyopathy primary-nonischemic EF 45%  . Hyperlipidemia   . GERD (gastroesophageal reflux disease)   . Polymyalgia rheumatica   . Sick sinus syndrome   . Atrial fibrillation   . Dysphagia   . Hearing loss, mixed, bilateral   . Increased prostate specific antigen (PSA) velocity   . Lumbar back pain   . BPH (benign prostatic hypertrophy)   . Breast mass     "on both sides" (06/28/2012)  . CHF (congestive heart failure)   . Elevated liver enzymes   . Esophageal stricture   . Pacemaker   . Arthritis     "some" (06/28/2012)  . Pacemaker infection      06/28/12     Family History  Problem Relation Age of Onset  . Heart attack Father 69    deceased  . Aneurysm Mother     deceased AAA  . Hypertension Mother   . Other Brother     deceased at birth     History   Social History  . Marital Status: Married    Spouse Name: N/A    Number of Children: 3  . Years of Education: N/A   Occupational History  . retired     Production manager   Social History Main Topics  . Smoking status: Former Smoker -- 1.00 packs/day for 40 years    Types: Cigarettes    Quit date: 03/10/1983  . Smokeless tobacco: Never Used  . Alcohol Use: No  . Drug Use: No  . Sexually Active: Not on file   Other Topics Concern  . Not on file   Social History Narrative  . No narrative on file  worked in Public librarian,  Was in [employer]  Allergies  Allergen Reactions  . Antihistamines, Diphenhydramine-Type Other (See Comments)    Causes difficulty in ability to urinate.     Outpatient Prescriptions Prior to Visit  Medication Sig Dispense Refill  . amiodarone (PACERONE) 200 MG tablet Take 1 tablet (200 mg total) by mouth daily.  90 tablet  3  . carvedilol (COREG) 12.5 MG tablet Take 12.5 mg by mouth 2 (two) times daily with a meal.      . digoxin (LANOXIN) 0.25 MG tablet Take 0.5 tablets (  125 mcg total) by mouth daily.  90 tablet  3  . finasteride (PROSCAR) 5 MG tablet Take 5 mg by mouth daily.       . furosemide (LASIX) 20 MG tablet Take 1 tablet (20 mg total) by mouth daily.  30 tablet  3  . magnesium oxide (MAG-OX) 400 MG tablet Take 400 mg by mouth daily.      . Multiple Vitamins-Minerals (MULTIVITAMIN WITH MINERALS) tablet Take 1 tablet by mouth daily.        . pantoprazole (PROTONIX) 40 MG tablet Take 40 mg by mouth 4 (four) times a week. Monday, Tuesday, Thursday and Saturday      . pravastatin (PRAVACHOL) 80 MG tablet Take 1 tablet (80 mg total) by mouth daily.  90 tablet  3  . PROAIR HFA 108 (90 BASE) MCG/ACT inhaler Inhale  1 puff into the lungs every 6 (six) hours as needed.       . warfarin (COUMADIN) 5 MG tablet Take 0.5 tablets (2.5 mg total) by mouth daily. 1 tablet (5 mg) on Monday, Wednesday and Friday, 1/2 tablet (2.5 mg) on all other days      . fluticasone (FLONASE) 50 MCG/ACT nasal spray Place 2 sprays into the nose daily.  16 g  3   No facility-administered medications prior to visit.       Objective:   Physical Exam Filed Vitals:   08/04/12 1630  BP: 122/72  Pulse: 73  Temp: 98.4 F (36.9 C)  TempSrc: Oral  Height: 5\' 6"  (1.676 m)  Weight: 156 lb (70.761 kg)  SpO2: 94%   Gen: Pleasant, well-nourished, in no distress,  normal affect  ENT: No lesions,  mouth clear,  oropharynx clear, no postnasal drip  Neck: No JVD, no TMG, no carotid bruits  Lungs: No use of accessory muscles, decreased breath sounds at R base.   Cardiovascular: RRR, heart sounds normal, no murmur or gallops, no peripheral edema  Musculoskeletal: No deformities, no cyanosis or clubbing  Neuro: alert, non focal  Skin: Warm, no lesions or rashes      Assessment & Plan:  Pleural effusion on right Residual fluid from prior hemothorax. He is currently holding coumadin for a supertherapeutic INR. He would like to schedule thora. Will work on this, arrange for him to get back on coumadin after the provcedure.   Dyspnea - will address the residual effusion first - will need PFT once effusion drained to assess for tobacco related lung disease and since on amiodarone.  - rov 1 month

## 2012-08-04 NOTE — Patient Instructions (Signed)
Keep taking lasix as ordered by Dr Ladona Ridgel

## 2012-08-04 NOTE — Progress Notes (Signed)
[address]       [place] [address]             [phone]            BRAYLEE BOSHER [facility] Medical Record #[id number] Date of Birth: 1929/03/31  Lindley Magnus, MD [name], MD  Chief Complaint:   PostOp Follow Up Visit Chest Tube Placement  History of Present Illness:      Patient returns to the office today with followup chest x-ray. He was seen 3 weeks ago in the hospital with a large right bloody pericardial effusion likely related to removal of infected pacemaker and subsequent reimplantation. He's been doing relatively well since discharge but has had some dyspnea on exertion. He was recently started on Lasix 20 mg a day by Dr. Sharrell Ku. Is also noted elevated INR, for several days his Coumadin is on hold.        History  Smoking status  . Former Smoker -- 1.00 packs/day for 40 years  . Types: Cigarettes  . Quit date: 03/10/1983  Smokeless tobacco  . Never Used       Allergies  Allergen Reactions  . Antihistamines, Diphenhydramine-Type Other (See Comments)    Causes difficulty in ability to urinate.    Current Outpatient Prescriptions  Medication Sig Dispense Refill  . amiodarone (PACERONE) 200 MG tablet Take 1 tablet (200 mg total) by mouth daily.  90 tablet  3  . carvedilol (COREG) 12.5 MG tablet Take 12.5 mg by mouth 2 (two) times daily with a meal.      . digoxin (LANOXIN) 0.25 MG tablet Take 0.5 tablets (125 mcg total) by mouth daily.  90 tablet  3  . finasteride (PROSCAR) 5 MG tablet Take 5 mg by mouth 4 (four) times a week. Monday, Tuesday, Thursday, Saturday      . fluticasone (FLONASE) 50 MCG/ACT nasal spray Place 2 sprays into the nose daily.  16 g  3  . magnesium oxide (MAG-OX) 400 MG tablet Take 400 mg by mouth daily.      . Multiple Vitamins-Minerals (MULTIVITAMIN WITH MINERALS) tablet Take 1 tablet by mouth daily.        . pantoprazole (PROTONIX) 40 MG tablet Take 40 mg by mouth 4 (four) times  a week. Monday, Tuesday, Thursday and Saturday      . pravastatin (PRAVACHOL) 80 MG tablet Take 1 tablet (80 mg total) by mouth daily.  90 tablet  3  . PROAIR HFA 108 (90 BASE) MCG/ACT inhaler Inhale 1 puff into the lungs every 6 (six) hours.      Marland Kitchen warfarin (COUMADIN) 5 MG tablet Take 0.5 tablets (2.5 mg total) by mouth daily. 1 tablet (5 mg) on Monday, Wednesday and Friday, 1/2 tablet (2.5 mg) on all other days      . furosemide (LASIX) 20 MG tablet Take 1 tablet (20 mg total) by mouth daily.  30 tablet  3   No current facility-administered medications for this visit.       Physical Exam: BP 107/70  Pulse 100  Resp 18  Ht 5\' 7"  (1.702 m)  Wt 156 lb (70.761 kg)  BMI 24.43 kg/m2  SpO2 95%  General appearance: alert and cooperative Neurologic: intact Heart: regular rate and rhythm, S1, S2 normal, no murmur, click, rub or gallop Lungs: diminished breath sounds RLL Abdomen: soft, non-tender; bowel sounds normal; no masses,  no organomegaly Extremities: extremities normal, atraumatic, no cyanosis  or edema and Homans sign is negative, no sign of DVT Wound: chest tube site ok, no pedal edema  Diagnostic Studies & Laboratory data:         Recent Radiology Findings: Dg Chest 2 View  08/04/2012   *RADIOLOGY REPORT*  Clinical Data: History of coronary artery disease and right pleural effusion, some chest tightness  CHEST - 2 VIEW  Comparison: Chest x-ray of 07/22/2012 and CT chest of 07/12/2012  Findings: There is little change in volume of the small right pleural effusion with right basilar atelectasis.  No left pleural effusion is seen.  Mild cardiomegaly is stable.  A dual lead permanent pacemaker remains.  IMPRESSION: Little change in volume of the right effusion with mild right basilar atelectasis.   Original Report Authenticated By: Dwyane Dee, M.D.      Recent Labs: Lab Results  Component Value Date   WBC 7.4 07/15/2012   HGB 10.7* 07/15/2012   HCT 31.9* 07/15/2012   PLT 494*  07/15/2012   GLUCOSE 119* 08/03/2012   CHOL 129 09/07/2011   TRIG 127.0 09/07/2011   HDL 38.40* 09/07/2011   LDLCALC 65 09/07/2011   ALT 41 07/07/2012   AST 32 07/07/2012   NA 137 08/03/2012   K 4.5 08/03/2012   CL 103 08/03/2012   CREATININE 0.9 08/03/2012   BUN 12 08/03/2012   CO2 28 08/03/2012   TSH 3.44 05/27/2012   INR 6.4* 08/03/2012      Assessment / Plan:      Small residual right pleural effusion after chest tube drainage of a large pericardial effusion, at this point recommend to the patient that he continue on the low-dose Lasix at his been prescribed by cardiology I will see him back in 4 weeks with a followup chest x-ray, to make sure we see complete resolution of the residual effusion.      Niang Mitcheltree B 08/04/2012 11:33 AM

## 2012-08-04 NOTE — Patient Instructions (Addendum)
We will arrange for an elective therapeutic right thoracentesis to drain as much fluid as possible.  We will need to coordinate your coumadin dosing for the procedure. We will do this once we know the date of your procedure.  We may decide to do breathing tests at some point in the future since you have smoked in the past and you are on amiodarone. We will address this at future visit.  Follow with Dr Delton Coombes in 1 month

## 2012-08-04 NOTE — Assessment & Plan Note (Signed)
-   will address the residual effusion first - will need PFT once effusion drained to assess for tobacco related lung disease and since on amiodarone.  - rov 1 month

## 2012-08-05 ENCOUNTER — Other Ambulatory Visit: Payer: Self-pay | Admitting: Emergency Medicine

## 2012-08-05 ENCOUNTER — Telehealth: Payer: Self-pay

## 2012-08-05 DIAGNOSIS — R0600 Dyspnea, unspecified: Secondary | ICD-10-CM

## 2012-08-05 DIAGNOSIS — J90 Pleural effusion, not elsewhere classified: Secondary | ICD-10-CM

## 2012-08-05 NOTE — Telephone Encounter (Signed)
Fax [phone] at [facility] [place], [place].  Faxed written order for STAT PT/INR to be drawn on 08/08/12 with results to [facility].  Will await results.  Call to pt's cell once received.

## 2012-08-05 NOTE — Telephone Encounter (Signed)
Called spoke with Dedra in [facility] inquiring about Coumadin instructions for upcoming ultrasound guided thoracentsis. Per Dr Fredia Sorrow pt does not need to hold Coumadin for upcoming procedure on 08/09/12.  Given INR 6.4 on 08/03/12 we will schedule INR check on 08/09/12 prior to procedure at [facility].    Called spoke with pt, advised he did not need to hold Coumadin prior to procedure.  Pt is going [place] town to the beach next [place] wants to have INR checked on Monday 08/08/12 since INR was so high on 5/28 in case he needs to hold Coumadin or r/s procedure before driving back from the beach on 08/09/12 for procedure.  Advised pt since he will be in the [facility] area [facility] will draw PT/INR with faxed lab order.  Pt will call hospital and get fax # and call us back today.  Advised pt we will fax order and await results and call him on his cell with results once we receive.  Will await call back from pt.

## 2012-08-08 ENCOUNTER — Encounter: Payer: Self-pay | Admitting: Pharmacist

## 2012-08-08 DIAGNOSIS — Z7901 Long term (current) use of anticoagulants: Secondary | ICD-10-CM | POA: Diagnosis not present

## 2012-08-08 DIAGNOSIS — I4891 Unspecified atrial fibrillation: Secondary | ICD-10-CM | POA: Diagnosis not present

## 2012-08-08 LAB — PROTIME-INR: INR: 1.6 — AB (ref 0.9–1.1)

## 2012-08-08 NOTE — Telephone Encounter (Signed)
Spoke with pt.  He did have INR drawn in [facility] today.  Will await results.

## 2012-08-08 NOTE — Telephone Encounter (Signed)
Follow Up     Pt is calling to follow up on his INR results. Please call.

## 2012-08-08 NOTE — Telephone Encounter (Signed)
Spoke with hospital.  They had the wrong fax number.  INR was 1.59.  Pt aware.  Okay for procedure tomorrow.

## 2012-08-09 ENCOUNTER — Ambulatory Visit (HOSPITAL_COMMUNITY): Payer: Medicare Other

## 2012-08-09 ENCOUNTER — Ambulatory Visit (HOSPITAL_COMMUNITY)
Admission: RE | Admit: 2012-08-09 | Discharge: 2012-08-09 | Disposition: A | Discharge status: Home / Self Care | Payer: Medicare Other | Source: Ambulatory Visit | Attending: Emergency Medicine | Admitting: Emergency Medicine

## 2012-08-09 ENCOUNTER — Encounter (HOSPITAL_COMMUNITY): Payer: Self-pay

## 2012-08-09 ENCOUNTER — Ambulatory Visit (HOSPITAL_COMMUNITY)
Admission: RE | Admit: 2012-08-09 | Discharge: 2012-08-09 | Disposition: A | Discharge status: Home / Self Care | Payer: Medicare Other | Source: Ambulatory Visit | Attending: Radiology | Admitting: Radiology

## 2012-08-09 DIAGNOSIS — J90 Pleural effusion, not elsewhere classified: Secondary | ICD-10-CM

## 2012-08-09 DIAGNOSIS — R0609 Other forms of dyspnea: Secondary | ICD-10-CM | POA: Diagnosis not present

## 2012-08-09 DIAGNOSIS — R0989 Other specified symptoms and signs involving the circulatory and respiratory systems: Secondary | ICD-10-CM | POA: Insufficient documentation

## 2012-08-09 DIAGNOSIS — I517 Cardiomegaly: Secondary | ICD-10-CM | POA: Diagnosis not present

## 2012-08-09 NOTE — Procedures (Signed)
Ultrasound of right chest finds severely loculated pleural effusion. Numerous thick septations US guided thoracentesis from right chest.  Yielded only 60mL of bloody tinged fluid.  No immediate complications.  Pt tolerated well.   Specimen was not sent for labs. CXR ordered.  Brayton El PA-C 08/09/2012 11:46 AM

## 2012-08-10 ENCOUNTER — Ambulatory Visit (HOSPITAL_COMMUNITY): Payer: Medicare Other

## 2012-08-11 ENCOUNTER — Telehealth: Payer: Self-pay | Admitting: Cardiology

## 2012-08-11 NOTE — Telephone Encounter (Signed)
New Problem:    Patient called in wanting to consult with you.  Please call back.

## 2012-08-11 NOTE — Telephone Encounter (Signed)
Spoke with patient.

## 2012-08-12 ENCOUNTER — Telehealth: Payer: Self-pay | Admitting: Internal Medicine

## 2012-08-12 NOTE — Telephone Encounter (Signed)
Pt has had an upset stomach since Monday.  Diarrhea only on Tuesday, no appetite, fatigue, nausea, no vomiting, no fever.  He is at [employer] and coming home tomorrow.  He is taking Protonix daily.  Per Dr Lovell Sheehan its probably a viral bug so pt should follow a clear liquid diet and no alcohol for 48 hours.  Called pt and gave Dr Lovell Sheehan instructions and told pt to eat chicken noodle soup, chicken broth, jello, and drink gingerale or sprite.  Told pt to call back on Monday if symptoms do not improve.  Pt verbalized understanding and had no questions

## 2012-08-12 NOTE — Telephone Encounter (Signed)
Pt has a stomach issues decline to make appt to see another provider until he talk with cindy.

## 2012-08-15 ENCOUNTER — Ambulatory Visit (INDEPENDENT_AMBULATORY_CARE_PROVIDER_SITE_OTHER): Payer: Medicare Other | Admitting: Pharmacist

## 2012-08-15 DIAGNOSIS — Z5189 Encounter for other specified aftercare: Secondary | ICD-10-CM | POA: Diagnosis not present

## 2012-08-15 DIAGNOSIS — J189 Pneumonia, unspecified organism: Secondary | ICD-10-CM | POA: Diagnosis not present

## 2012-08-15 DIAGNOSIS — J90 Pleural effusion, not elsewhere classified: Secondary | ICD-10-CM | POA: Diagnosis not present

## 2012-08-15 DIAGNOSIS — I4891 Unspecified atrial fibrillation: Secondary | ICD-10-CM

## 2012-08-15 LAB — POCT INR: INR: 5.5

## 2012-08-16 DIAGNOSIS — N400 Enlarged prostate without lower urinary tract symptoms: Secondary | ICD-10-CM | POA: Diagnosis not present

## 2012-08-16 DIAGNOSIS — R351 Nocturia: Secondary | ICD-10-CM | POA: Diagnosis not present

## 2012-08-17 ENCOUNTER — Encounter: Payer: Self-pay | Admitting: Cardiology

## 2012-08-17 ENCOUNTER — Ambulatory Visit (INDEPENDENT_AMBULATORY_CARE_PROVIDER_SITE_OTHER): Payer: Medicare Other | Admitting: Cardiology

## 2012-08-17 VITALS — BP 114/66 | HR 70 | Ht 66.0 in | Wt 151.0 lb

## 2012-08-17 DIAGNOSIS — I4891 Unspecified atrial fibrillation: Secondary | ICD-10-CM | POA: Diagnosis not present

## 2012-08-17 DIAGNOSIS — Z950 Presence of cardiac pacemaker: Secondary | ICD-10-CM | POA: Diagnosis not present

## 2012-08-17 DIAGNOSIS — I5022 Chronic systolic (congestive) heart failure: Secondary | ICD-10-CM | POA: Diagnosis not present

## 2012-08-17 DIAGNOSIS — I495 Sick sinus syndrome: Secondary | ICD-10-CM

## 2012-08-17 DIAGNOSIS — K219 Gastro-esophageal reflux disease without esophagitis: Secondary | ICD-10-CM

## 2012-08-17 DIAGNOSIS — I428 Other cardiomyopathies: Secondary | ICD-10-CM

## 2012-08-17 DIAGNOSIS — J90 Pleural effusion, not elsewhere classified: Secondary | ICD-10-CM

## 2012-08-17 DIAGNOSIS — E785 Hyperlipidemia, unspecified: Secondary | ICD-10-CM

## 2012-08-17 MED ORDER — LISINOPRIL 5 MG PO TABS: 5.0000 mg | ORAL_TABLET | Freq: Every day | ORAL | Status: DC

## 2012-08-17 NOTE — Patient Instructions (Addendum)
Start lisinopril 5mg  daily.   Your physician recommends that you return for a FASTING lipid profile /liver profile/TSH/BMET in 2 weeks.    Your physician recommends that you schedule a follow-up appointment in: 3 months with Dr Shirlee Latch.

## 2012-08-17 NOTE — Progress Notes (Signed)
Patient ID: [name], male   DOB: 09-27-1929, 79 y.o.   MRN: [id number] PCP: Dr. Cato [name]  79 yo with history of paroxysmal atrial fibrillation, nonischemic cardiomyopathy, and complete heart block with St Jude CRT-P system presents for cardiology followup.  He has been seen by Dr. Riley [name] in the past and is seen by me for the first time today.  He has had an eventful several months.  He was admitted in 4/14 with pacemaker pocket infection.  His pacemaker was removed and temporary-permanent device was placed.  Later, he had the temporary-permanent device removed and a new CRT-P device was placed.  He developed dyspnea post-operatively and was found to have a large right-sided pleural effusion.  He was admitted and got a chest tube for drainage.  He was also started on Lasix.  Most recently, he had a right-sided thoracentesis by Dr. Delton [name].    Currently, LV lead is off because his intrinsic QRS is narrower than the paced QRS.  He says that his breathing has been better with the LV lead off.  He has stopped Lasix because it made him feel "bad."  Weight is down 18 lbs since last appointment.  He says that he feels better every day.  He is walking for exercise.  No tachypalpitations to suggest recurrent atrial fibrillation and he is in NSR today.  He walked for 30 minutes this morning with minimal dyspnea.  He can walk up stairs and up hills with mild DOE.  No chest pain.    ECG: a-paced, RBBB  Labs (7/13); LDL 65, HDL 38 Labs (3/14): TSH normal Labs (4/14):  ALT 61, AST 62 Labs (5/14): K 4.5, creatinine 0.9, BNP 727, digoxin 0.9  PMH: 1. Low back pain 2. Hyperlipidemia 3. GERD 4. BPH 5. Polymyalgia rheumatica 6. Atrial fibrillation: Failed Tikosyn in the past.  Paroxysmal, on amiodarone and warfarin.  DCCV in 4/13, 7/13, 10/13.   7. Transaminitis: Mild, attributed to amiodarone.  8. Complete heart block s/p St Jude CRT-P device.  Patient developed PCM pocket infection in 4/14 and had his first  system removed with placement of a temporary permanent PCM.  He later had CRT-P device replaced.  This was complicated by large right-sided hemorrhagic pleural effusion requiring chest tube.  LV lead turned off because native QRS narrower than BiV-paced QRS.   9. Nonischemic cardiomyopathy: Echo (9/13) with EF 40-45%, diffuse HK, mild MR.  Mild CAD only on prior LHC.   SH: Married, prior smoker (quit 1985), no ETOH x 15 yrs, retired.    FH: Father with MI at 36, mother with AAA.   ROS: All systems reviewed and negative except as per HPI.   Current Outpatient Prescriptions  Medication Sig Dispense Refill  . amiodarone (PACERONE) 200 MG tablet Take 1 tablet (200 mg total) by mouth daily.  90 tablet  3  . carvedilol (COREG) 12.5 MG tablet Take 12.5 mg by mouth 2 (two) times daily with a meal.      . digoxin (LANOXIN) 0.25 MG tablet Take 0.5 tablets (125 mcg total) by mouth daily.  90 tablet  3  . finasteride (PROSCAR) 5 MG tablet Take 5 mg by mouth daily.       . fluticasone (FLONASE) 50 MCG/ACT nasal spray Place 2 sprays into the nose daily as needed.      . magnesium oxide (MAG-OX) 400 MG tablet Take 400 mg by mouth daily.      . Multiple Vitamins-Minerals (MULTIVITAMIN WITH MINERALS) tablet Take  1 tablet by mouth daily.        . pantoprazole (PROTONIX) 40 MG tablet Take 40 mg by mouth 4 (four) times a week. Monday, Tuesday, Thursday and Saturday      . pravastatin (PRAVACHOL) 80 MG tablet Take 1 tablet (80 mg total) by mouth daily.  90 tablet  3  . PROAIR HFA 108 (90 BASE) MCG/ACT inhaler Inhale 1 puff into the lungs every 6 (six) hours as needed.       . warfarin (COUMADIN) 5 MG tablet Take 0.5 tablets (2.5 mg total) by mouth daily. 1 tablet (5 mg) on Monday, Wednesday and Friday, 1/2 tablet (2.5 mg) on all other days      . lisinopril (PRINIVIL,ZESTRIL) 5 MG tablet Take 1 tablet (5 mg total) by mouth daily.  30 tablet  3   No current facility-administered medications for this visit.     BP 114/66  Pulse 70  Ht 5\' 6"  (1.676 m)  Wt 151 lb (68.493 kg)  BMI 24.38 kg/m2 General: NAD Neck: No JVD, no thyromegaly or thyroid nodule.  Lungs: Decreased breath sounds right base.  CV: Nondisplaced PMI.  Heart regular S1/S2, no S3/S4, no murmur.  No peripheral edema.  No carotid bruit.  Normal pedal pulses.  Abdomen: Soft, nontender, no hepatosplenomegaly, no distention.  Neurologic: Alert and oriented x 3.  Psych: Normal affect. Extremities: No clubbing or cyanosis.   Assessment/Plan: 1. Nonischemic cardiomyopathy: NYHA class II without evidence for significant volume overload on exam.  EF 40-45% on last echo - He did not like Lasix.  I think it is ok to stay off it for now.  - Continue current Coreg and digoxin.  Digoxin level was ok in 5/14.   - Add lisinopril 5 mg daily, check BMET in 2 wks.  2. Atrial fibrillation: Paroxysmal.  He is in NSR on amiodarone.  He is on warfarin.  - At a future appointment, we can discuss NOAC to replace warfarin.  - Check LFTs and TSH today given amiodarone use.  He will need eventual PFTs but would wait until his pleural effusion has resolved.  3. Hyperlipidemia: History of nonobstructive CAD.  Check lipids on pravastatin.   4. CRT-P device: LV lead off because intrinsic QRS narrower than BiV-paced QRS.   5. Pleural effusion: Status post device replacement.  Has had chest tube and later thoracentesis.  There has been a loculated component.  He is seeing Drs [name] and [name] for effusion followup.   [name] 08/17/2012

## 2012-08-18 ENCOUNTER — Telehealth: Payer: Self-pay | Admitting: Emergency Medicine

## 2012-08-18 NOTE — Telephone Encounter (Signed)
Called pt to discuss the plans post thoracentesis. Some fluid removed but loculated component present. Recommend to him that he review w Dr Tyrone Sage to consider VATS decortication/clean-out. He will call Dr Timoteo Expose office tomorrow.

## 2012-08-23 ENCOUNTER — Encounter: Payer: Self-pay | Admitting: Internal Medicine

## 2012-08-23 ENCOUNTER — Ambulatory Visit (INDEPENDENT_AMBULATORY_CARE_PROVIDER_SITE_OTHER): Payer: Medicare Other | Admitting: Internal Medicine

## 2012-08-23 VITALS — BP 116/74 | HR 84 | Temp 98.1°F | Wt 155.0 lb

## 2012-08-23 DIAGNOSIS — J90 Pleural effusion, not elsewhere classified: Secondary | ICD-10-CM

## 2012-08-23 NOTE — Progress Notes (Signed)
Patient ID: [name], male   DOB: 07-19-1929, 79 y.o.   MRN: [id number]  Dyspnea- much improved but still has some. Has appt with dr. Tyrone Sage tomorrow.  He was able to go to [place] open and "walked at my own pace"  No fever or chills (hx of infected pacemaker)  Nonischemic cardiomyopathy-- tolerating meds  PMR- off of prednisone and not having any sxs.   Reviewed pmh, psh, sochx Reviewed meds   patient admits to mild right sided chest pain, moderate shortness of breath, no pnd.  No other significant complaints.   Past Medical History  Diagnosis Date  . Coronary artery disease   . Cardiomyopathy primary-nonischemic EF 45%  . Hyperlipidemia   . GERD (gastroesophageal reflux disease)   . Polymyalgia rheumatica   . Sick sinus syndrome   . Atrial fibrillation   . Dysphagia   . Hearing loss, mixed, bilateral   . Increased prostate specific antigen (PSA) velocity   . Lumbar back pain   . BPH (benign prostatic hypertrophy)   . Breast mass     "on both sides" (06/28/2012)  . CHF (congestive heart failure)   . Elevated liver enzymes   . Esophageal stricture   . Pacemaker   . Arthritis     "some" (06/28/2012)  . Pacemaker infection     06/28/12    History   Social History  . Marital Status: Married    Spouse Name: N/A    Number of Children: 3  . Years of Education: N/A   Occupational History  . retired     Production manager   Social History Main Topics  . Smoking status: Former Smoker -- 1.00 packs/day for 40 years    Types: Cigarettes    Quit date: 03/10/1983  . Smokeless tobacco: Never Used  . Alcohol Use: No  . Drug Use: No  . Sexually Active: Not on file   Other Topics Concern  . Not on file   Social History Narrative  . No narrative on file    Past Surgical History  Procedure Laterality Date  . Cataract extraction w/ intraocular lens  implant, bilateral Bilateral   . Umbilical hernia repair    . Insert / replace / remove pacemaker    .  Inguinal hernia repair Right 2012    RIH  . Umbilical hernia repair    . Cardioversion  06/17/2011    Procedure: CARDIOVERSION;  Surgeon: [name], MD;  Location: [facility] OR;  Service: Cardiovascular;  Laterality: N/A;  . Cardioversion  09/09/2011    Procedure: CARDIOVERSION;  Surgeon: Luis Abed, MD;  Location: [facility] OR;  Service: Cardiovascular;  Laterality: N/A;  . Cardioversion  01/01/2012    Procedure: CARDIOVERSION;  Surgeon: Herby Abraham, MD;  Location: [facility] ENDOSCOPY;  Service: Cardiovascular;  Laterality: N/A;  . Tee without cardioversion  01/01/2012    Procedure: TRANSESOPHAGEAL ECHOCARDIOGRAM (TEE);  Surgeon: Pricilla Riffle, MD;  Location: [facility] ENDOSCOPY;  Service: Cardiovascular;  Laterality: N/A;  . Eye surgery    . Generator removal Left 06/15/2012    Procedure: GENERATOR REMOVAL;  Surgeon: Marinus Maw, MD;  Location: [facility] OR;  Service: Cardiovascular;  Laterality: Left;  . Pacemaker generator change N/A 06/15/2012    Procedure: PACEMAKER GENERATOR CHANGE;  Surgeon: Marinus Maw, MD;  Location: [facility] OR;  Service: Cardiovascular;  Laterality: N/A;  . Tonsillectomy and adenoidectomy    . Cardiac catheterization      Family History  Problem Relation Age  of Onset  . Heart attack Father 97    deceased  . Aneurysm Mother     deceased AAA  . Hypertension Mother   . Other Brother     deceased at birth    Allergies  Allergen Reactions  . Antihistamines, Diphenhydramine-Type Other (See Comments)    Causes difficulty in ability to urinate.    Current Outpatient Prescriptions on File Prior to Visit  Medication Sig Dispense Refill  . amiodarone (PACERONE) 200 MG tablet Take 1 tablet (200 mg total) by mouth daily.  90 tablet  3  . carvedilol (COREG) 12.5 MG tablet Take 12.5 mg by mouth 2 (two) times daily with a meal.      . digoxin (LANOXIN) 0.25 MG tablet Take 0.5 tablets (125 mcg total) by mouth daily.  90 tablet  3  . finasteride (PROSCAR) 5 MG tablet Take 5 mg by mouth  daily.       . fluticasone (FLONASE) 50 MCG/ACT nasal spray Place 2 sprays into the nose daily as needed.      Marland Kitchen lisinopril (PRINIVIL,ZESTRIL) 5 MG tablet Take 1 tablet (5 mg total) by mouth daily.  30 tablet  3  . magnesium oxide (MAG-OX) 400 MG tablet Take 400 mg by mouth daily.      . Multiple Vitamins-Minerals (MULTIVITAMIN WITH MINERALS) tablet Take 1 tablet by mouth daily.        . pantoprazole (PROTONIX) 40 MG tablet Take 40 mg by mouth 4 (four) times a week. Monday, Tuesday, Thursday and Saturday      . pravastatin (PRAVACHOL) 80 MG tablet Take 1 tablet (80 mg total) by mouth daily.  90 tablet  3  . PROAIR HFA 108 (90 BASE) MCG/ACT inhaler Inhale 1 puff into the lungs every 6 (six) hours as needed.       . warfarin (COUMADIN) 5 MG tablet Take 0.5 tablets (2.5 mg total) by mouth daily. 1 tablet (5 mg) on Monday, Wednesday and Friday, 1/2 tablet (2.5 mg) on all other days       No current facility-administered medications on file prior to visit.     patient denies chest pain, shortness of breath, orthopnea. Denies lower extremity edema, abdominal pain, change in appetite, change in bowel movements. Patient denies rashes, musculoskeletal complaints. No other specific complaints in a complete review of systems.   BP 116/74  Pulse 84  Temp(Src) 98.1 F (36.7 C) (Oral)  Wt 155 lb (70.308 kg)  BMI 25.03 kg/m2   well-developed well-nourished male in no acute distress. HEENT exam atraumatic, normocephalic, neck supple without jugular venous distention. Chest clear to auscultation but decrease breath sounds right base.  cardiac exam S1-S2 are regular.

## 2012-08-23 NOTE — Assessment & Plan Note (Signed)
He is having some sxs of dyspnea He has appointment with Dr. Tyrone Sage tomorrow to discuss possible surgical intervention.

## 2012-08-24 ENCOUNTER — Ambulatory Visit: Payer: Medicare Other | Admitting: Cardiothoracic Surgery

## 2012-08-25 ENCOUNTER — Ambulatory Visit (INDEPENDENT_AMBULATORY_CARE_PROVIDER_SITE_OTHER): Payer: Medicare Other | Admitting: *Deleted

## 2012-08-25 DIAGNOSIS — I4891 Unspecified atrial fibrillation: Secondary | ICD-10-CM | POA: Diagnosis not present

## 2012-08-26 ENCOUNTER — Encounter: Payer: Self-pay | Admitting: Cardiothoracic Surgery

## 2012-08-26 ENCOUNTER — Ambulatory Visit
Admission: RE | Admit: 2012-08-26 | Discharge: 2012-08-26 | Disposition: A | Discharge status: Home / Self Care | Payer: Medicare Other | Source: Ambulatory Visit | Attending: Cardiothoracic Surgery | Admitting: Cardiothoracic Surgery

## 2012-08-26 ENCOUNTER — Other Ambulatory Visit: Payer: Self-pay | Admitting: *Deleted

## 2012-08-26 ENCOUNTER — Ambulatory Visit (INDEPENDENT_AMBULATORY_CARE_PROVIDER_SITE_OTHER): Payer: Medicare Other | Admitting: Cardiothoracic Surgery

## 2012-08-26 VITALS — BP 110/57 | HR 89 | Resp 16 | Ht 67.0 in | Wt 145.0 lb

## 2012-08-26 DIAGNOSIS — J90 Pleural effusion, not elsewhere classified: Secondary | ICD-10-CM | POA: Diagnosis not present

## 2012-08-26 DIAGNOSIS — J9819 Other pulmonary collapse: Secondary | ICD-10-CM | POA: Diagnosis not present

## 2012-08-26 NOTE — Progress Notes (Signed)
[address]       [place] [address]             [phone]            [name] [facility] Medical Record #[id number] Date of Birth: Jan 16, 1930  Craig Helling, MD [name], MD  Chief Complaint:   PostOp Follow Up Visit Chest Tube Placement  History of Present Illness:      Patient returns to the office today with followup chest x-ray. He was seen 3 weeks ago in the office after hospitalization with a large right bloody pericardial effusion likely related to removal of infected pacemaker and subsequent reimplantation. He's been doing relatively well since discharge but has had some dyspnea on exertion, he has no fever or chills. He is going to the hospital today to pick up his wife who has been hospitalized for pneumonia. He was recently started on Lasix 20 mg a day by Dr. Sharrell Ku. Since last seen he had thoracentesis done by Pulmonary which returned  60 ml of fluid       History  Smoking status  . Former Smoker -- 1.00 packs/day for 40 years  . Types: Cigarettes  . Quit date: 03/10/1983  Smokeless tobacco  . Never Used       Allergies  Allergen Reactions  . Antihistamines, Diphenhydramine-Type Other (See Comments)    Causes difficulty in ability to urinate.    Current Outpatient Prescriptions  Medication Sig Dispense Refill  . amiodarone (PACERONE) 200 MG tablet Take 1 tablet (200 mg total) by mouth daily.  90 tablet  3  . carvedilol (COREG) 12.5 MG tablet Take 12.5 mg by mouth 2 (two) times daily with a meal.      . digoxin (LANOXIN) 0.25 MG tablet Take 0.5 tablets (125 mcg total) by mouth daily.  90 tablet  3  . finasteride (PROSCAR) 5 MG tablet Take 5 mg by mouth daily.       . fluticasone (FLONASE) 50 MCG/ACT nasal spray Place 2 sprays into the nose daily as needed.      . magnesium oxide (MAG-OX) 400 MG tablet Take 400 mg by mouth daily.      . Multiple Vitamins-Minerals (MULTIVITAMIN WITH MINERALS) tablet Take  1 tablet by mouth daily.        . pantoprazole (PROTONIX) 40 MG tablet Take 40 mg by mouth 4 (four) times a week. Monday, Tuesday, Thursday and Saturday      . pravastatin (PRAVACHOL) 80 MG tablet Take 1 tablet (80 mg total) by mouth daily.  90 tablet  3  . PROAIR HFA 108 (90 BASE) MCG/ACT inhaler Inhale 1 puff into the lungs every 6 (six) hours as needed.       . tamsulosin (FLOMAX) 0.4 MG CAPS Take 1 capsule by mouth daily.      Marland Kitchen warfarin (COUMADIN) 5 MG tablet Take 0.5 tablets (2.5 mg total) by mouth daily. 1 tablet (5 mg) on Monday, Wednesday and Friday, 1/2 tablet (2.5 mg) on all other days       No current facility-administered medications for this visit.       Physical Exam: BP 110/57  Pulse 89  Resp 16  Ht 5\' 7"  (1.702 m)  Wt 145 lb (65.772 kg)  BMI 22.71 kg/m2  SpO2 95%  General appearance: alert and cooperative Neurologic: intact Heart: regular rate and rhythm, S1, S2 normal, no murmur, click, rub or gallop Lungs: diminished  breath sounds RLL Abdomen: soft, non-tender; bowel sounds normal; no masses,  no organomegaly Extremities: extremities normal, atraumatic, no cyanosis or edema and Homans sign is negative, no sign of DVT Wound: chest tube site ok, no pedal edema  Diagnostic Studies & Laboratory data:         Recent Radiology Findings: Dg Chest 2 View  08/26/2012   *RADIOLOGY REPORT*  Clinical Data: Cough and short of breath.  Follow up pleural effusion  CHEST - 2 VIEW  Comparison: 08/09/2012  Findings: Small to moderate right pleural effusion is unchanged. Mild right lower lobe atelectasis also unchanged.  Negative for heart failure or edema.  Left lung is clear. Pacemaker unchanged.  IMPRESSION: Right pleural effusion and right lower lobe atelectasis unchanged. No new findings.  No pneumothorax.   Original Report Authenticated By: Janeece Riggers, M.D.      Recent Labs: Lab Results  Component Value Date   WBC 7.4 07/15/2012   HGB 10.7* 07/15/2012   HCT 31.9*  07/15/2012   PLT 494* 07/15/2012   GLUCOSE 119* 08/03/2012   CHOL 129 09/07/2011   TRIG 127.0 09/07/2011   HDL 38.40* 09/07/2011   LDLCALC 65 09/07/2011   ALT 41 07/07/2012   AST 32 07/07/2012   NA 137 08/03/2012   K 4.5 08/03/2012   CL 103 08/03/2012   CREATININE 0.9 08/03/2012   BUN 12 08/03/2012   CO2 28 08/03/2012   TSH 3.44 05/27/2012   INR 2.6 08/25/2012      Assessment / Plan:      Small residual right pleural effusion after chest tube drainage of a large pericardial effusion.  I will see him back in 3 weeks with a followup chest x-ray, to make sure we see complete resolution of the residual effusion, If not will proceed with CT of the chest and poss VATS    Aidian Salomon B 08/26/2012 3:40 PM

## 2012-09-01 ENCOUNTER — Other Ambulatory Visit: Payer: Medicare Other

## 2012-09-02 ENCOUNTER — Other Ambulatory Visit (INDEPENDENT_AMBULATORY_CARE_PROVIDER_SITE_OTHER): Payer: Medicare Other

## 2012-09-02 DIAGNOSIS — I4891 Unspecified atrial fibrillation: Secondary | ICD-10-CM

## 2012-09-02 DIAGNOSIS — I5022 Chronic systolic (congestive) heart failure: Secondary | ICD-10-CM

## 2012-09-02 LAB — BASIC METABOLIC PANEL
CO2: 26 mEq/L (ref 19–32)
Calcium: 8.9 mg/dL (ref 8.4–10.5)
Chloride: 106 mEq/L (ref 96–112)
Glucose, Bld: 117 mg/dL — ABNORMAL HIGH (ref 70–99)
Sodium: 139 mEq/L (ref 135–145)

## 2012-09-02 LAB — LIPID PANEL
HDL: 34.2 mg/dL — ABNORMAL LOW (ref >39.00)
LDL Cholesterol: 77 mg/dL (ref 0–99)
Total CHOL/HDL Ratio: 4

## 2012-09-02 LAB — HEPATIC FUNCTION PANEL
AST: 26 U/L (ref 0–37)
Alkaline Phosphatase: 64 U/L (ref 39–117)
Total Bilirubin: 0.5 mg/dL (ref 0.3–1.2)

## 2012-09-08 ENCOUNTER — Ambulatory Visit: Payer: Medicare Other | Admitting: Cardiothoracic Surgery

## 2012-09-15 ENCOUNTER — Ambulatory Visit: Payer: Medicare Other | Admitting: Emergency Medicine

## 2012-09-15 ENCOUNTER — Ambulatory Visit (INDEPENDENT_AMBULATORY_CARE_PROVIDER_SITE_OTHER): Payer: Medicare Other | Admitting: *Deleted

## 2012-09-15 DIAGNOSIS — I4891 Unspecified atrial fibrillation: Secondary | ICD-10-CM | POA: Diagnosis not present

## 2012-09-20 ENCOUNTER — Other Ambulatory Visit: Payer: Self-pay | Admitting: *Deleted

## 2012-09-20 ENCOUNTER — Encounter: Payer: Self-pay | Admitting: Internal Medicine

## 2012-09-20 ENCOUNTER — Ambulatory Visit (INDEPENDENT_AMBULATORY_CARE_PROVIDER_SITE_OTHER): Payer: Medicare Other | Admitting: Internal Medicine

## 2012-09-20 VITALS — BP 114/74 | HR 76 | Temp 97.8°F | Wt 154.0 lb

## 2012-09-20 DIAGNOSIS — J90 Pleural effusion, not elsewhere classified: Secondary | ICD-10-CM | POA: Diagnosis not present

## 2012-09-20 NOTE — Assessment & Plan Note (Signed)
His symptoms of shortness of breath continued resolved. No further evaluation at this time. I'll keep him a close eye on him. I'll see him back in 6 weeks.

## 2012-09-20 NOTE — Progress Notes (Signed)
Patient ID: [name], male   DOB: 04-Nov-1929, 79 y.o.   MRN: [id number]  Reviewed Dr. Tyrone Sage and Ronelle Nigh notes-- pt stopped lisinopril-- sounds like profound orthostatic sxs.  Pleural effusion-- "watch and see approach"  Lipids- tolerating meds  afib-- no bleeding complications on warfarin.   GERD- no sxs on PPI  Reviewed pmh, psh, sochx  Reviewed meds   well-developed well-nourished male in no acute distress. HEENT exam atraumatic, normocephalic, neck supple without jugular venous distention. Chest clear to auscultation cardiac exam S1-S2 are regular. Abdominal exam overweight with bowel sounds, soft and nontender. Extremities no edema. Neurologic exam is alert with a normal gait.

## 2012-09-22 ENCOUNTER — Encounter: Payer: Medicare Other | Admitting: Cardiothoracic Surgery

## 2012-09-23 ENCOUNTER — Encounter: Payer: Self-pay | Admitting: Cardiothoracic Surgery

## 2012-09-23 ENCOUNTER — Ambulatory Visit (INDEPENDENT_AMBULATORY_CARE_PROVIDER_SITE_OTHER): Payer: Medicare Other | Admitting: Cardiothoracic Surgery

## 2012-09-23 ENCOUNTER — Ambulatory Visit
Admission: RE | Admit: 2012-09-23 | Discharge: 2012-09-23 | Disposition: A | Discharge status: Home / Self Care | Payer: Medicare Other | Source: Ambulatory Visit | Attending: Cardiothoracic Surgery | Admitting: Cardiothoracic Surgery

## 2012-09-23 VITALS — BP 115/70 | HR 90 | Resp 16 | Ht 67.0 in | Wt 149.0 lb

## 2012-09-23 DIAGNOSIS — J9819 Other pulmonary collapse: Secondary | ICD-10-CM | POA: Diagnosis not present

## 2012-09-23 DIAGNOSIS — Z9889 Other specified postprocedural states: Secondary | ICD-10-CM

## 2012-09-23 DIAGNOSIS — J90 Pleural effusion, not elsewhere classified: Secondary | ICD-10-CM

## 2012-09-23 NOTE — Progress Notes (Signed)
[address]       [place] [address]             [phone]            [name] [facility] Medical Record #[id number] Date of Birth: 1929/10/10  [name], MD [name], MD  Chief Complaint:   PostOp Follow Up Visit Chest Tube Placement  History of Present Illness:      Patient returns to the office today with followup chest x-ray. He was seen 4 weeks ago in the office after hospitalization with a large right bloody pericardial effusion likely related to removal of infected pacemaker and subsequent reimplantation. He's has been improving since last seen . He is back exercising,  dyspnea on exertion is improved, he has no fever or chills.      History  Smoking status  . Former Smoker -- 1.00 packs/day for 40 years  . Types: Cigarettes  . Quit date: 03/10/1983  Smokeless tobacco  . Never Used       Allergies  Allergen Reactions  . Antihistamines, Diphenhydramine-Type Other (See Comments)    Causes difficulty in ability to urinate.    Current Outpatient Prescriptions  Medication Sig Dispense Refill  . amiodarone (PACERONE) 200 MG tablet Take 1 tablet (200 mg total) by mouth daily.  90 tablet  3  . carvedilol (COREG) 12.5 MG tablet Take 12.5 mg by mouth 2 (two) times daily with a meal.      . digoxin (LANOXIN) 0.25 MG tablet Take 0.5 tablets (125 mcg total) by mouth daily.  90 tablet  3  . finasteride (PROSCAR) 5 MG tablet Take 5 mg by mouth daily.       . fluticasone (FLONASE) 50 MCG/ACT nasal spray Place 2 sprays into the nose daily as needed.      . magnesium oxide (MAG-OX) 400 MG tablet Take 400 mg by mouth daily.      . Multiple Vitamins-Minerals (MULTIVITAMIN WITH MINERALS) tablet Take 1 tablet by mouth daily.        . pantoprazole (PROTONIX) 40 MG tablet Take 40 mg by mouth 4 (four) times a week. Monday, Tuesday, Thursday and Saturday      . pravastatin (PRAVACHOL) 80 MG tablet Take 1 tablet (80 mg total) by mouth  daily.  90 tablet  3  . PROAIR HFA 108 (90 BASE) MCG/ACT inhaler Inhale 1 puff into the lungs every 6 (six) hours as needed.       . tamsulosin (FLOMAX) 0.4 MG CAPS Take 1 capsule by mouth daily.      Marland Kitchen warfarin (COUMADIN) 5 MG tablet Take 2.5 mg by mouth daily. 1 tablet (5 mg) on Sunday, 1/2 tab (2.5 mg) all others       No current facility-administered medications for this visit.       Physical Exam: BP 115/70  Pulse 90  Resp 16  Ht 5\' 7"  (1.702 m)  Wt 149 lb (67.586 kg)  BMI 23.33 kg/m2  SpO2 97%  General appearance: alert and cooperative Neurologic: intact Heart: regular rate and rhythm, S1, S2 normal, no murmur, click, rub or gallop Lungs: diminished breath sounds RLL Abdomen: soft, non-tender; bowel sounds normal; no masses,  no organomegaly Extremities: extremities normal, atraumatic, no cyanosis or edema and Homans sign is negative, no sign of DVT Wound: chest tube site ok, no pedal edema  Diagnostic Studies & Laboratory data:  Recent Radiology Findings: Dg Chest 2 View  09/23/2012   *RADIOLOGY REPORT*  Clinical Data: Follow up pleural effusion  CHEST - 2 VIEW  Comparison: Prior chest x-ray 08/26/2012  Findings: Left subclavian approach biventricular cardiac rhythm maintenance device.  Leads project over the right atrium, right ventricle and within a cardiac vein overlying the left ventricle. Persistent small and likely loculated right pleural effusion with associated basilar atelectasis.  No significant interval change compared to prior.  Cardiac and mediastinal contours are unchanged. No pneumothorax, pulmonary edema or new consolidation.  No acute osseous abnormality.  IMPRESSION:  Stable small, and likely loculated right pleural effusion with associated atelectasis.   Original Report Authenticated By: Malachy Moan, M.D.      Recent Labs: Lab Results  Component Value Date   WBC 7.4 07/15/2012   HGB 10.7* 07/15/2012   HCT 31.9* 07/15/2012   PLT 494* 07/15/2012     GLUCOSE 117* 09/02/2012   CHOL 136 09/02/2012   TRIG 125.0 09/02/2012   HDL 34.20* 09/02/2012   LDLCALC 77 09/02/2012   ALT 21 09/02/2012   AST 26 09/02/2012   NA 139 09/02/2012   K 4.0 09/02/2012   CL 106 09/02/2012   CREATININE 0.9 09/02/2012   BUN 12 09/02/2012   CO2 26 09/02/2012   TSH 3.46 09/02/2012   INR 1.8 09/15/2012      Assessment / Plan:      Small residual right pleural effusion after chest tube drainage of a large pericardial effusion. No indication for invasive procedure (VATS) at this point. I will see him back in 3 months with a followup chest x-ray.  [name] B 09/23/2012 5:47 PM

## 2012-10-05 ENCOUNTER — Ambulatory Visit (INDEPENDENT_AMBULATORY_CARE_PROVIDER_SITE_OTHER): Payer: Medicare Other | Admitting: *Deleted

## 2012-10-05 DIAGNOSIS — I4891 Unspecified atrial fibrillation: Secondary | ICD-10-CM

## 2012-10-05 LAB — POCT INR: INR: 1.6

## 2012-10-07 ENCOUNTER — Encounter: Payer: Medicare Other | Admitting: Internal Medicine

## 2012-10-10 ENCOUNTER — Telehealth: Payer: Self-pay | Admitting: Internal Medicine

## 2012-10-10 NOTE — Telephone Encounter (Signed)
New Prob  Pt would like to speak with you regarding a missed appt.  I offered to schedule the next available with the doctor but he said he better check with you first.

## 2012-10-11 NOTE — Telephone Encounter (Signed)
Follow Up     Pt following up on call from earlier.

## 2012-10-11 NOTE — Telephone Encounter (Signed)
lmom for patient to return my call.  Can always check his device on the day he has appointment with Dr Shirlee Latch

## 2012-10-12 ENCOUNTER — Encounter: Payer: Self-pay | Admitting: Internal Medicine

## 2012-10-12 NOTE — Telephone Encounter (Signed)
Follow Up call  Pt returned call about pacemaker// he feels as if it is loose//

## 2012-10-13 NOTE — Telephone Encounter (Signed)
Spoke with patient Efraim Kaufmann is going to call the patient and put him in on Brooke's schedule a day Dr Ladona Ridgel is here.  He was very Adult nurse

## 2012-10-13 NOTE — Telephone Encounter (Signed)
Follow up  Pt states he is returning your call. He said if you cannot reach him at home you can try him at [phone]

## 2012-10-18 ENCOUNTER — Ambulatory Visit (INDEPENDENT_AMBULATORY_CARE_PROVIDER_SITE_OTHER): Payer: Medicare Other | Admitting: *Deleted

## 2012-10-18 DIAGNOSIS — I4891 Unspecified atrial fibrillation: Secondary | ICD-10-CM

## 2012-10-18 LAB — POCT INR: INR: 1.6

## 2012-10-25 ENCOUNTER — Ambulatory Visit: Payer: Medicare Other | Admitting: Cardiology

## 2012-10-28 DIAGNOSIS — R39198 Other difficulties with micturition: Secondary | ICD-10-CM | POA: Diagnosis not present

## 2012-10-28 DIAGNOSIS — N400 Enlarged prostate without lower urinary tract symptoms: Secondary | ICD-10-CM | POA: Diagnosis not present

## 2012-10-28 DIAGNOSIS — R350 Frequency of micturition: Secondary | ICD-10-CM | POA: Diagnosis not present

## 2012-10-28 DIAGNOSIS — R351 Nocturia: Secondary | ICD-10-CM | POA: Diagnosis not present

## 2012-11-01 ENCOUNTER — Ambulatory Visit (INDEPENDENT_AMBULATORY_CARE_PROVIDER_SITE_OTHER): Payer: Medicare Other | Admitting: Internal Medicine

## 2012-11-01 ENCOUNTER — Encounter: Payer: Self-pay | Admitting: Internal Medicine

## 2012-11-01 ENCOUNTER — Ambulatory Visit (INDEPENDENT_AMBULATORY_CARE_PROVIDER_SITE_OTHER): Payer: Medicare Other | Admitting: Pharmacist

## 2012-11-01 VITALS — BP 104/68 | HR 83 | Ht 67.0 in | Wt 157.0 lb

## 2012-11-01 VITALS — BP 110/68 | Temp 97.7°F | Wt 157.0 lb

## 2012-11-01 DIAGNOSIS — E785 Hyperlipidemia, unspecified: Secondary | ICD-10-CM | POA: Diagnosis not present

## 2012-11-01 DIAGNOSIS — I495 Sick sinus syndrome: Secondary | ICD-10-CM | POA: Diagnosis not present

## 2012-11-01 DIAGNOSIS — I4891 Unspecified atrial fibrillation: Secondary | ICD-10-CM

## 2012-11-01 DIAGNOSIS — K4090 Unilateral inguinal hernia, without obstruction or gangrene, not specified as recurrent: Secondary | ICD-10-CM | POA: Diagnosis not present

## 2012-11-01 DIAGNOSIS — G252 Other specified forms of tremor: Secondary | ICD-10-CM | POA: Insufficient documentation

## 2012-11-01 DIAGNOSIS — Z950 Presence of cardiac pacemaker: Secondary | ICD-10-CM | POA: Diagnosis not present

## 2012-11-01 DIAGNOSIS — G250 Essential tremor: Secondary | ICD-10-CM | POA: Diagnosis not present

## 2012-11-01 DIAGNOSIS — I428 Other cardiomyopathies: Secondary | ICD-10-CM

## 2012-11-01 LAB — PACEMAKER DEVICE OBSERVATION
AL IMPEDENCE PM: 437.5 Ohm
AL THRESHOLD: 0.75 V
ATRIAL PACING PM: 99
DEVICE MODEL PM: 2926516
LV LEAD IMPEDENCE PM: 710 Ohm
RV LEAD AMPLITUDE: 9.5 mv
RV LEAD IMPEDENCE PM: 487.5 Ohm
RV LEAD THRESHOLD: 0.75 V

## 2012-11-01 LAB — POCT INR: INR: 2.4

## 2012-11-01 NOTE — Assessment & Plan Note (Signed)
curently in SR by exam On warfarin

## 2012-11-01 NOTE — Progress Notes (Signed)
afib-- no sxs and tolerating meds Pleural effusion-- sxs are improving but he does note some SOB when "i do too much". Reviewed cxrs from may, June and July. Chronic anticoagulation- no bleeding complications PMR- no sxs and off of Prednisone. Tremor- past several months with intention Inguinal Hernia-- recurrent  Reviewed pmh, psh, sochx, meds  Ros:  patient denies chest pain, shortness of breath, orthopnea. Denies lower extremity edema, abdominal pain, change in appetite, change in bowel movements. Patient denies rashes, musculoskeletal complaints. No other specific complaints in a complete review of systems.   Vitals- reviewed  well-developed well-nourished male in no acute distress. HEENT exam atraumatic, normocephalic, neck supple without jugular venous distention. Chest clear to auscultation cardiac exam S1-S2 are regular. Abdominal exam overweight with bowel sounds, soft and nontender. Extremities no edema. Neurologic exam is alert with a normal gait. Neuro: finger to nose testing shows mild tremor at end movement

## 2012-11-01 NOTE — Assessment & Plan Note (Signed)
Will ask dr. Margaree Mackintosh to relook

## 2012-11-01 NOTE — Assessment & Plan Note (Signed)
Lipid Panel     Component Value Date/Time   CHOL 136 09/02/2012 0734   TRIG 125.0 09/02/2012 0734   HDL 34.20* 09/02/2012 0734   CHOLHDL 4 09/02/2012 0734   VLDL 25.0 09/02/2012 0734   LDLCALC 77 09/02/2012 0734    Reviewed labs

## 2012-11-01 NOTE — Assessment & Plan Note (Signed)
Discussed No treatment at this time

## 2012-11-01 NOTE — Assessment & Plan Note (Signed)
The patient's biventricular pacemaker initially had left ventricular lead non-capture. We have re\re interrogated the device today and his left ventricular pacing threshold is satisfactory. His left ventricular lead is been reprogrammed on. He will followup in several months.

## 2012-11-01 NOTE — Patient Instructions (Addendum)
Remote monitoring is used to monitor your Pacemaker of ICD from home. This monitoring reduces the number of office visits required to check your device to one time per year. It allows [place] to keep an eye on the functioning of your device to ensure it is working properly. You are scheduled for a device check from home on 02/06/13 . You may send your transmission at any time that day. If you have a wireless device, the transmission will be sent automatically. After your physician reviews your transmission, you will receive a postcard with your next transmission date.   Your physician wants you to follow-up in: 9 MONTHS WITH DR Court Joy will receive a reminder letter in the mail two months in advance. If you don't receive a letter, please call our office to schedule the follow-up appointment.

## 2012-11-01 NOTE — Progress Notes (Signed)
HPI [name] returns today for followup. He is a very pleasant 79 year old man with chronic systolic heart failure, symptomatic bradycardia, high-grade conduction system disease, who developed a pacemaker pocket infection and underwent extraction, initial insertion of a temporary permanent pacemaker, followed by insertion of a biventricular pacemaker. His procedure was complicated by atrial lead perforation x2, resulting in a pleural effusion. He has improved and is almost back to normal. He denies fevers, chills, night sweats, or other symptoms. No palpitations. He has recently moved to an assisted living situation. Allergies  Allergen Reactions  . Antihistamines, Diphenhydramine-Type Other (See Comments)    Causes difficulty in ability to urinate.     Current Outpatient Prescriptions  Medication Sig Dispense Refill  . amiodarone (PACERONE) 200 MG tablet Take 1 tablet (200 mg total) by mouth daily.  90 tablet  3  . carvedilol (COREG) 12.5 MG tablet Take 12.5 mg by mouth 2 (two) times daily with a meal.      . digoxin (LANOXIN) 0.25 MG tablet Take 0.5 tablets (125 mcg total) by mouth daily.  90 tablet  3  . finasteride (PROSCAR) 5 MG tablet Take 5 mg by mouth daily.       . fluticasone (FLONASE) 50 MCG/ACT nasal spray Place 2 sprays into the nose daily as needed.      . magnesium oxide (MAG-OX) 400 MG tablet Take 400 mg by mouth daily.      . Multiple Vitamins-Minerals (MULTIVITAMIN WITH MINERALS) tablet Take 1 tablet by mouth daily.        . pantoprazole (PROTONIX) 40 MG tablet Take 40 mg by mouth daily.       . pravastatin (PRAVACHOL) 80 MG tablet Take 80 mg by mouth 2 (two) times daily.      Marland Kitchen PROAIR HFA 108 (90 BASE) MCG/ACT inhaler Inhale 1 puff into the lungs every 6 (six) hours as needed.       . tamsulosin (FLOMAX) 0.4 MG CAPS Take 1 capsule by mouth daily.      Marland Kitchen warfarin (COUMADIN) 5 MG tablet Take 2.5 mg by mouth daily. 1 tablet (5 mg) on Sunday, 1/2 tab (2.5 mg) all others        No current facility-administered medications for this visit.     Past Medical History  Diagnosis Date  . Coronary artery disease   . Cardiomyopathy primary-nonischemic EF 45%  . Hyperlipidemia   . GERD (gastroesophageal reflux disease)   . Polymyalgia rheumatica   . Sick sinus syndrome   . Atrial fibrillation   . Dysphagia   . Hearing loss, mixed, bilateral   . Increased prostate specific antigen (PSA) velocity   . Lumbar back pain   . BPH (benign prostatic hypertrophy)   . Breast mass     "on both sides" (06/28/2012)  . CHF (congestive heart failure)   . Elevated liver enzymes   . Esophageal stricture   . Pacemaker   . Arthritis     "some" (06/28/2012)  . Pacemaker infection     06/28/12    ROS:   All systems reviewed and negative except as noted in the HPI.   Past Surgical History  Procedure Laterality Date  . Cataract extraction w/ intraocular lens  implant, bilateral Bilateral   . Umbilical hernia repair    . Insert / replace / remove pacemaker    . Inguinal hernia repair Right 2012    RIH  . Umbilical hernia repair    . Cardioversion  06/17/2011  Procedure: CARDIOVERSION;  Surgeon: [name], MD;  Location: [facility] OR;  Service: Cardiovascular;  Laterality: N/A;  . Cardioversion  09/09/2011    Procedure: CARDIOVERSION;  Surgeon: Luis Abed, MD;  Location: [facility] OR;  Service: Cardiovascular;  Laterality: N/A;  . Cardioversion  01/01/2012    Procedure: CARDIOVERSION;  Surgeon: Herby Abraham, MD;  Location: [facility] ENDOSCOPY;  Service: Cardiovascular;  Laterality: N/A;  . Tee without cardioversion  01/01/2012    Procedure: TRANSESOPHAGEAL ECHOCARDIOGRAM (TEE);  Surgeon: Pricilla Riffle, MD;  Location: [facility] ENDOSCOPY;  Service: Cardiovascular;  Laterality: N/A;  . Eye surgery    . Generator removal Left 06/15/2012    Procedure: GENERATOR REMOVAL;  Surgeon: Marinus Maw, MD;  Location: [facility] OR;  Service: Cardiovascular;  Laterality: Left;  . Pacemaker generator change  N/A 06/15/2012    Procedure: PACEMAKER GENERATOR CHANGE;  Surgeon: Marinus Maw, MD;  Location: [facility] OR;  Service: Cardiovascular;  Laterality: N/A;  . Tonsillectomy and adenoidectomy    . Cardiac catheterization       Family History  Problem Relation Age of Onset  . Heart attack Father 62    deceased  . Aneurysm Mother     deceased AAA  . Hypertension Mother   . Other Brother     deceased at birth     History   Social History  . Marital Status: Married    Spouse Name: N/A    Number of Children: 3  . Years of Education: N/A   Occupational History  . retired     Production manager   Social History Main Topics  . Smoking status: Former Smoker -- 1.00 packs/day for 40 years    Types: Cigarettes    Quit date: 03/10/1983  . Smokeless tobacco: Never Used  . Alcohol Use: No  . Drug Use: No  . Sexual Activity: Not on file   Other Topics Concern  . Not on file   Social History Narrative  . No narrative on file     BP 104/68  Pulse 83  Ht 5\' 7"  (1.702 m)  Wt 157 lb (71.215 kg)  BMI 24.58 kg/m2  Physical Exam:  Well appearing  Elderly man, NAD HEENT: Unremarkable Neck:  6 cm JVD, no thyromegally Back:  No CVA tenderness Lungs:  Clear with no wheezes, rales, or rhonchi. Well-healed pacemaker incision. Well-healed extraction incision. HEART:  Regular rate rhythm, no murmurs, no rubs, no clicks Abd:  soft, positive bowel sounds, no organomegally, no rebound, no guarding Ext:  2 plus pulses, no edema, no cyanosis, no clubbing Skin:  No rashes no nodules Neuro:  CN II through XII intact, motor grossly intact   DEVICE  Normal device function.  See PaceArt for details.   Assess/Plan:

## 2012-11-01 NOTE — Assessment & Plan Note (Signed)
Reviewed cxrs Stable Will not recheck cxr

## 2012-11-01 NOTE — Assessment & Plan Note (Signed)
He appears to be maintaining sinus rhythm. No change in medical therapy. 

## 2012-11-09 ENCOUNTER — Ambulatory Visit (INDEPENDENT_AMBULATORY_CARE_PROVIDER_SITE_OTHER): Payer: Medicare Other | Admitting: *Deleted

## 2012-11-09 ENCOUNTER — Encounter: Payer: Self-pay | Admitting: Cardiology

## 2012-11-09 ENCOUNTER — Ambulatory Visit (INDEPENDENT_AMBULATORY_CARE_PROVIDER_SITE_OTHER): Payer: Medicare Other | Admitting: Cardiology

## 2012-11-09 VITALS — BP 114/52 | HR 70 | Ht 67.0 in | Wt 159.0 lb

## 2012-11-09 DIAGNOSIS — I2510 Atherosclerotic heart disease of native coronary artery without angina pectoris: Secondary | ICD-10-CM | POA: Diagnosis not present

## 2012-11-09 DIAGNOSIS — Z950 Presence of cardiac pacemaker: Secondary | ICD-10-CM

## 2012-11-09 DIAGNOSIS — I4891 Unspecified atrial fibrillation: Secondary | ICD-10-CM

## 2012-11-09 DIAGNOSIS — E785 Hyperlipidemia, unspecified: Secondary | ICD-10-CM | POA: Diagnosis not present

## 2012-11-09 DIAGNOSIS — I428 Other cardiomyopathies: Secondary | ICD-10-CM | POA: Diagnosis not present

## 2012-11-09 MED ORDER — LISINOPRIL 2.5 MG PO TABS
2.5000 mg | ORAL_TABLET | Freq: Every day | ORAL | Status: DC
Start: 2012-11-09 — End: 2013-02-07

## 2012-11-09 NOTE — Patient Instructions (Addendum)
Start lisinopril 2.5mg  daily. You can take 1/2 of a 5mg  tablet daily.  Your physician recommends that you return for lab work in: 1 month--TSH/Liver/digoxin level/BMET. This can be scheduled at the [facility].  Your physician recommends that you schedule a follow-up appointment in: 4 months with Dr Shirlee Latch.

## 2012-11-09 NOTE — Progress Notes (Signed)
12 lead showing wide QRS of and 1st degree block although pt has CRT-P. On 11/01/12, LV lead was programmed on.  Initial AV sensed/paced delay at 325/365ms to allow intrinsic V pacing. Delays were temp changed to sensed/paced 170/217ms, device Bi-V paced normally.  LV threshold checked, consistent from last visit @ 1.0v/1.12ms.  AV sensed/paced delay left at original setting of 325/372ms. No changes made to AV delay.  Changed LV output from 1.5V to 2.0V.   Pt A-pacing >99% & Bi-V-pacing 77%.

## 2012-11-10 NOTE — Progress Notes (Signed)
Patient ID: [name], male   DOB: [birth year], 79 y.o.   MRN: [id number] PCP: Dr. Cato Mulligan  79 yo with history of paroxysmal atrial fibrillation, nonischemic cardiomyopathy, and complete heart block with St Jude CRT-P system presents for cardiology followup.  He was admitted in 77/14 with pacemaker pocket infection.  His pacemaker was removed and temporary-permanent device was placed.  Later, he had the temporary-permanent device removed and a new CRT-P device was placed.  He developed dyspnea post-operatively and was found to have a large right-sided pleural effusion.  He was admitted and got a chest tube for drainage.  He was also started on Lasix.  Most recently, he had a right-sided thoracentesis by Dr. Delton Coombes.  He had followup with Dr. Tyrone Sage and it was decided that he would not need VATS.    Overall, he is feeling good.  At last appointment, I put him on lisinopril 5 mg daily but it made him feel tired so he stopped it.  He is walking for exercise.  No tachypalpitations to suggest recurrent atrial fibrillation and he is A-pacing today.  No dyspnea walking on flat ground.  Minimal dyspnea walking up a flight of steps.  No chest pain. Weight is up 8 lbs today but he had lost a lot of weight around the time of his prolonged illness earlier this year.    I did a limited pacemaker interrogation today.  He was a-paced, v-sensed on ECG with RBBB.  He is set for a long A-V delay to promote native ventricular pacing. When he does v-pace, he has his LV lead on now so he will not be pacing only the RV.   ECG: a-paced, RBBB  Labs (7/13); LDL 65, HDL 38 Labs (3/14): TSH normal Labs (4/14):  ALT 61, AST 62 Labs (5/14): K 4.5, creatinine 0.9, BNP 727, digoxin 0.9 Labs (6/14): LDL 77, HDL 34, K 4, creatinine 0.9, TSH normal  PMH: 1. Low back pain 2. Hyperlipidemia 3. GERD 4. BPH 5. Polymyalgia rheumatica 6. Atrial fibrillation: Failed Tikosyn in the past.  Paroxysmal, on amiodarone and warfarin.   DCCV in 4/13, 7/13, 10/13.   7. Transaminitis: Mild, attributed to amiodarone.  8. Complete heart block s/p St Jude CRT-P device.  Patient developed PCM pocket infection in 4/14 and had his first system removed with placement of a temporary permanent PCM.  He later had CRT-P device replaced.  This was complicated by large right-sided hemorrhagic pleural effusion requiring chest tube.   9. Nonischemic cardiomyopathy: Echo (9/13) with EF 40-45%, diffuse HK, mild MR.  Mild CAD only on prior LHC.   SH: Married, prior smoker (quit 1985), no ETOH x 15 yrs, retired.    FH: Father with MI at 26, mother with AAA.   ROS: All systems reviewed and negative except as per HPI.   Current Outpatient Prescriptions  Medication Sig Dispense Refill  . amiodarone (PACERONE) 200 MG tablet Take 1 tablet (200 mg total) by mouth daily.  90 tablet  3  . carvedilol (COREG) 12.5 MG tablet Take 12.5 mg by mouth 2 (two) times daily with a meal.      . digoxin (LANOXIN) 0.25 MG tablet Take 0.5 tablets (125 mcg total) by mouth daily.  90 tablet  3  . finasteride (PROSCAR) 5 MG tablet Take 5 mg by mouth daily.       . fluticasone (FLONASE) 50 MCG/ACT nasal spray Place 2 sprays into the nose daily as needed.      Marland Kitchen  magnesium oxide (MAG-OX) 400 MG tablet Take 400 mg by mouth daily.      . Multiple Vitamins-Minerals (MULTIVITAMIN WITH MINERALS) tablet Take 1 tablet by mouth daily.        . pantoprazole (PROTONIX) 40 MG tablet Take 40 mg by mouth daily.       . pravastatin (PRAVACHOL) 80 MG tablet Take 1 tablet (80 mg total) by mouth daily.      Marland Kitchen PROAIR HFA 108 (90 BASE) MCG/ACT inhaler Inhale 1 puff into the lungs every 6 (six) hours as needed.       . tamsulosin (FLOMAX) 0.4 MG CAPS Take 1 capsule by mouth daily.      Marland Kitchen warfarin (COUMADIN) 5 MG tablet Take 2.5 mg by mouth daily. 1 tablet (5 mg) on Sunday, 1/2 tab (2.5 mg) all others      . lisinopril (PRINIVIL,ZESTRIL) 2.5 MG tablet Take 1 tablet (2.5 mg total) by mouth  daily.  30 tablet  6   No current facility-administered medications for this visit.    BP 114/52  Pulse 70  Ht 5\' 7"  (1.702 m)  Wt 72.122 kg (159 lb)  BMI 24.9 kg/m2 General: NAD Neck: No JVD, no thyromegaly or thyroid nodule.  Lungs: Decreased breath sounds right base.  CV: Nondisplaced PMI.  Heart regular S1/S2, no S3/S4, no murmur.  No peripheral edema.  No carotid bruit.  Normal pedal pulses.  Abdomen: Soft, nontender, no hepatosplenomegaly, no distention.  Neurologic: Alert and oriented x 3.  Psych: Normal affect. Extremities: No clubbing or cyanosis.   Assessment/Plan: 1. Nonischemic cardiomyopathy: NYHA class II without evidence for significant volume overload on exam.  EF 40-45% on last echo - He can continue to stay off Lasix.  - Continue current Coreg and digoxin.  Repeat digoxin level with labs in 10/14.   - He was unable to tolerate lisinopril 5 mg daily due to fatigue.  I will have him try lisinopril 2.5 mg daily.  If fatigue returns, stop it.  2. Atrial fibrillation: Paroxysmal.  He is in NSR on amiodarone.  He is on warfarin.  - At a future appointment, we can discuss NOAC to replace warfarin.  - Check LFTs and TSH in 10/14 given amiodarone use.  He will need a yearly eye exam. 3. Hyperlipidemia: History of nonobstructive CAD.  Good lipids on pravastatin.   4. CRT-P device: Native ventricular conduction shows RBBB.  Therefore, patient is set for A-pacing with long A-V delay as BiV-pacing in this situation is not likely to help much.  When the patient does pace in the ventricle, the LV lead is on so he will get BiV-pacing rather than RV pacing alone.   5. Pleural effusion: No need for VATS per Dr Tyrone Sage.    [name] 11/10/2012

## 2012-11-15 ENCOUNTER — Ambulatory Visit (INDEPENDENT_AMBULATORY_CARE_PROVIDER_SITE_OTHER): Payer: Medicare Other | Admitting: General Surgery

## 2012-11-22 ENCOUNTER — Encounter (INDEPENDENT_AMBULATORY_CARE_PROVIDER_SITE_OTHER): Payer: Self-pay | Admitting: General Surgery

## 2012-11-22 ENCOUNTER — Encounter (INDEPENDENT_AMBULATORY_CARE_PROVIDER_SITE_OTHER): Payer: Self-pay | Admitting: Surgery

## 2012-11-22 ENCOUNTER — Ambulatory Visit (INDEPENDENT_AMBULATORY_CARE_PROVIDER_SITE_OTHER): Payer: Medicare Other

## 2012-11-22 ENCOUNTER — Ambulatory Visit (INDEPENDENT_AMBULATORY_CARE_PROVIDER_SITE_OTHER): Payer: Medicare Other | Admitting: Surgery

## 2012-11-22 VITALS — BP 104/60 | HR 84 | Temp 98.5°F | Resp 14 | Ht 67.0 in | Wt 156.6 lb

## 2012-11-22 DIAGNOSIS — I4891 Unspecified atrial fibrillation: Secondary | ICD-10-CM

## 2012-11-22 DIAGNOSIS — K4091 Unilateral inguinal hernia, without obstruction or gangrene, recurrent: Secondary | ICD-10-CM | POA: Insufficient documentation

## 2012-11-22 NOTE — Progress Notes (Signed)
Patient ID: [name], male   DOB: 15-Mar-1929, 79 y.o.   MRN: [id number]  Chief Complaint  Patient presents with  . New Evaluation    eval ing hernia    HPI [name] is a 79 y.o. male.  Referred by Dr. Birdie Sons for evaluation of recurrent right inguinal hernia Cards - Dr. Shirlee Latch Dr. [name] CT - Dr. Tyrone Sage GU - Dr. Darvin Neighbours   HPI This is an 79 year old male who is status post open repair of a large indirect right inguinal hernia in 2012. The patient had been doing well until recently. He and his wife moved from their home to an apartment at [employer]. During this move he has to do some heavy lifting. He has developed a palpable bulge in his right groin.  It remains reducible.He denies any obstructive symptoms. Over the last couple of weeks, he has resumed his regular exercise regimen and this area in his groin actually feels better. His wife is having knee replacement in October and he wants to wait until late November to have his surgery.  He has had a lot of medical issues this year including having his pacemaker replaced. This was complicated by a bloody pleural effusion. He seems to be doing much better now.  Past Medical History  Diagnosis Date  . Coronary artery disease   . Cardiomyopathy primary-nonischemic EF 45%  . Hyperlipidemia   . GERD (gastroesophageal reflux disease)   . Polymyalgia rheumatica   . Sick sinus syndrome   . Atrial fibrillation   . Dysphagia   . Hearing loss, mixed, bilateral   . Increased prostate specific antigen (PSA) velocity   . Lumbar back pain   . BPH (benign prostatic hypertrophy)   . Breast mass     "on both sides" (06/28/2012)  . CHF (congestive heart failure)   . Elevated liver enzymes   . Esophageal stricture   . Pacemaker   . Arthritis     "some" (06/28/2012)  . Pacemaker infection     06/28/12    Past Surgical History  Procedure Laterality Date  . Cataract extraction w/ intraocular lens  implant,  bilateral Bilateral   . Umbilical hernia repair    . Insert / replace / remove pacemaker    . Inguinal hernia repair Right 2012    RIH  . Umbilical hernia repair    . Cardioversion  06/17/2011    Procedure: CARDIOVERSION;  Surgeon: [name], MD;  Location: [facility] OR;  Service: Cardiovascular;  Laterality: N/A;  . Cardioversion  09/09/2011    Procedure: CARDIOVERSION;  Surgeon: Luis Abed, MD;  Location: [facility] OR;  Service: Cardiovascular;  Laterality: N/A;  . Cardioversion  01/01/2012    Procedure: CARDIOVERSION;  Surgeon: Herby Abraham, MD;  Location: [facility] ENDOSCOPY;  Service: Cardiovascular;  Laterality: N/A;  . Tee without cardioversion  01/01/2012    Procedure: TRANSESOPHAGEAL ECHOCARDIOGRAM (TEE);  Surgeon: Pricilla Riffle, MD;  Location: [facility] ENDOSCOPY;  Service: Cardiovascular;  Laterality: N/A;  . Eye surgery    . Generator removal Left 06/15/2012    Procedure: GENERATOR REMOVAL;  Surgeon: Marinus Maw, MD;  Location: [facility] OR;  Service: Cardiovascular;  Laterality: Left;  . Pacemaker generator change N/A 06/15/2012    Procedure: PACEMAKER GENERATOR CHANGE;  Surgeon: Marinus Maw, MD;  Location: [facility] OR;  Service: Cardiovascular;  Laterality: N/A;  . Tonsillectomy and adenoidectomy    . Cardiac catheterization      Family History  Problem Relation Age of Onset  . Heart attack Father 4    deceased  . Aneurysm Mother     deceased AAA  . Hypertension Mother   . Other Brother     deceased at birth    Social History History  Substance Use Topics  . Smoking status: Former Smoker -- 1.00 packs/day for 40 years    Types: Cigarettes    Quit date: 03/10/1983  . Smokeless tobacco: Never Used  . Alcohol Use: No    Allergies  Allergen Reactions  . Antihistamines, Diphenhydramine-Type Other (See Comments)    Causes difficulty in ability to urinate.    Current Outpatient Prescriptions  Medication Sig Dispense Refill  . amiodarone (PACERONE) 200 MG tablet Take 1 tablet (200 mg  total) by mouth daily.  90 tablet  3  . carvedilol (COREG) 12.5 MG tablet Take 12.5 mg by mouth 2 (two) times daily with a meal.      . digoxin (LANOXIN) 0.25 MG tablet Take 0.5 tablets (125 mcg total) by mouth daily.  90 tablet  3  . finasteride (PROSCAR) 5 MG tablet Take 5 mg by mouth daily.       . fluticasone (FLONASE) 50 MCG/ACT nasal spray Place 2 sprays into the nose daily as needed.      . magnesium oxide (MAG-OX) 400 MG tablet Take 400 mg by mouth daily.      . Multiple Vitamins-Minerals (MULTIVITAMIN WITH MINERALS) tablet Take 1 tablet by mouth daily.        . pantoprazole (PROTONIX) 40 MG tablet Take 40 mg by mouth daily.       . pravastatin (PRAVACHOL) 80 MG tablet Take 1 tablet (80 mg total) by mouth daily.      Marland Kitchen PROAIR HFA 108 (90 BASE) MCG/ACT inhaler Inhale 1 puff into the lungs every 6 (six) hours as needed.       . tamsulosin (FLOMAX) 0.4 MG CAPS Take 2 capsules by mouth daily.       Marland Kitchen warfarin (COUMADIN) 5 MG tablet Take 2.5 mg by mouth daily. 1 tablet (5 mg) on Sunday, 1/2 tab (2.5 mg) all others      . lisinopril (PRINIVIL,ZESTRIL) 2.5 MG tablet Take 1 tablet (2.5 mg total) by mouth daily.  30 tablet  6   No current facility-administered medications for this visit.    Review of Systems Review of Systems  Constitutional: Negative for fever, chills and unexpected weight change.  HENT: Negative for hearing loss, congestion, sore throat, trouble swallowing and voice change.   Eyes: Negative for visual disturbance.  Respiratory: Negative for cough and wheezing.   Cardiovascular: Negative for chest pain, palpitations and leg swelling.  Gastrointestinal: Negative for nausea, vomiting, abdominal pain, diarrhea, constipation, blood in stool, abdominal distention, anal bleeding and rectal pain.  Genitourinary: Positive for scrotal swelling. Negative for hematuria and difficulty urinating.  Musculoskeletal: Negative for arthralgias.  Skin: Negative for rash and wound.   Neurological: Negative for seizures, syncope, weakness and headaches.  Hematological: Negative for adenopathy. Does not bruise/bleed easily.  Psychiatric/Behavioral: Negative for confusion.    Blood pressure 104/60, pulse 84, temperature 98.5 F (36.9 C), temperature source Temporal, resp. rate 14, height 5\' 7"  (1.702 m), weight 156 lb 9.6 oz (71.033 kg).  Physical Exam Physical Exam WDWN in NAD HEENT:  EOMI, sclera anicteric Neck:  No masses, no thyromegaly Lungs:  CTA bilaterally; normal respiratory effort CV:  Regular rate and rhythm; no murmurs Abd:  +bowel sounds, soft, non-tender,  no masses GU;  Bilateral descended testes; no testicular masses; Reducible right inguinal hernia; left inguinal hernia Ext:  Well-perfused; no edema Skin:  Warm, dry; no sign of jaundice  Data Reviewed none  Assessment    Recurrent right inguinal hernia Anticoagulation/ CAD/ cardiomyopathy/ pacemaker     Plan    We will first need to obtain cardiac clearance from Dr. Shirlee Latch and Dr. Ladona Ridgel. He will need to hold his Coumadin for 5 days prior to surgery. Recommend open repair of recurrent right inguinal hernia with mesh.The surgical procedure has been discussed with the patient.  Potential risks, benefits, alternative treatments, and expected outcomes have been explained.  All of the patient's questions at this time have been answered.  The likelihood of reaching the patient's treatment goal is good.  The patient understand the proposed surgical procedure and wishes to proceed.         Ilana Prezioso K. 11/22/2012, 11:08 AM

## 2012-11-29 ENCOUNTER — Telehealth: Payer: Self-pay | Admitting: Internal Medicine

## 2012-11-29 NOTE — Telephone Encounter (Signed)
New problem   Annie/CCS Need to know if pt can be cleared for inguinal hernia sx from Dr Ladona Ridgel b/c of pt's pacer.

## 2012-12-01 ENCOUNTER — Telehealth (INDEPENDENT_AMBULATORY_CARE_PROVIDER_SITE_OTHER): Payer: Self-pay | Admitting: General Surgery

## 2012-12-01 ENCOUNTER — Encounter (INDEPENDENT_AMBULATORY_CARE_PROVIDER_SITE_OTHER): Payer: Self-pay

## 2012-12-01 NOTE — Telephone Encounter (Signed)
Called patient this morning to let him know that I've received both notes from Dr Ladona Ridgel and Dr Shirlee Latch that he is cleared for surgery. Dr Shirlee Latch stated that can hold coumadin without bridging Lovenox. Would continue beta blocker peri-operatively. Dr Ladona Ridgel ok to proceed with surgery. Will have notes scan into epic. Patient orders are at the scheduler, and he will understand that they will call today or tomorrow

## 2012-12-01 NOTE — Telephone Encounter (Signed)
Follow up   [name] calling in reference to previous message concerning pt's clearance for sx. Please advise.

## 2012-12-01 NOTE — Telephone Encounter (Signed)
Spoke with Mardene Sayer in medical records has re faxed to her attention

## 2012-12-05 DIAGNOSIS — Z961 Presence of intraocular lens: Secondary | ICD-10-CM | POA: Diagnosis not present

## 2012-12-05 DIAGNOSIS — H524 Presbyopia: Secondary | ICD-10-CM | POA: Diagnosis not present

## 2012-12-05 DIAGNOSIS — H35369 Drusen (degenerative) of macula, unspecified eye: Secondary | ICD-10-CM | POA: Diagnosis not present

## 2012-12-13 ENCOUNTER — Ambulatory Visit (INDEPENDENT_AMBULATORY_CARE_PROVIDER_SITE_OTHER): Payer: Medicare Other | Admitting: *Deleted

## 2012-12-13 DIAGNOSIS — I4891 Unspecified atrial fibrillation: Secondary | ICD-10-CM | POA: Diagnosis not present

## 2012-12-15 ENCOUNTER — Other Ambulatory Visit (INDEPENDENT_AMBULATORY_CARE_PROVIDER_SITE_OTHER): Payer: Medicare Other

## 2012-12-15 DIAGNOSIS — Z950 Presence of cardiac pacemaker: Secondary | ICD-10-CM | POA: Diagnosis not present

## 2012-12-15 DIAGNOSIS — E785 Hyperlipidemia, unspecified: Secondary | ICD-10-CM | POA: Diagnosis not present

## 2012-12-15 DIAGNOSIS — I4891 Unspecified atrial fibrillation: Secondary | ICD-10-CM | POA: Diagnosis not present

## 2012-12-15 DIAGNOSIS — I428 Other cardiomyopathies: Secondary | ICD-10-CM | POA: Diagnosis not present

## 2012-12-15 LAB — BASIC METABOLIC PANEL
BUN: 20 mg/dL (ref 6–23)
CO2: 30 mEq/L (ref 19–32)
Chloride: 103 mEq/L (ref 96–112)
GFR: 73.56 mL/min (ref >60.00)
Glucose, Bld: 113 mg/dL — ABNORMAL HIGH (ref 70–99)
Potassium: 4.7 mEq/L (ref 3.5–5.1)
Sodium: 140 mEq/L (ref 135–145)

## 2012-12-15 LAB — HEPATIC FUNCTION PANEL
ALT: 31 U/L (ref 0–53)
Alkaline Phosphatase: 55 U/L (ref 39–117)
Bilirubin, Direct: 0.1 mg/dL (ref 0.0–0.3)
Total Bilirubin: 0.8 mg/dL (ref 0.3–1.2)
Total Protein: 7.1 g/dL (ref 6.0–8.3)

## 2012-12-19 ENCOUNTER — Ambulatory Visit: Admit: 2012-12-19 | Payer: Self-pay | Admitting: Surgery

## 2012-12-19 ENCOUNTER — Other Ambulatory Visit: Payer: Self-pay | Admitting: *Deleted

## 2012-12-19 SURGERY — REPAIR, HERNIA, INGUINAL, ADULT
Anesthesia: General | Laterality: Right

## 2012-12-22 ENCOUNTER — Ambulatory Visit (INDEPENDENT_AMBULATORY_CARE_PROVIDER_SITE_OTHER): Payer: Medicare Other | Admitting: *Deleted

## 2012-12-22 DIAGNOSIS — I428 Other cardiomyopathies: Secondary | ICD-10-CM

## 2012-12-22 LAB — PACEMAKER DEVICE OBSERVATION
AL IMPEDENCE PM: 475 Ohm
BAMS-0001: 150 {beats}/min
BAMS-0003: 70 {beats}/min
BATTERY VOLTAGE: 2.993 V

## 2012-12-22 NOTE — Progress Notes (Signed)
AV delays reprogrammed 180/147msec per Dr. Ladona Ridgel.

## 2012-12-26 DIAGNOSIS — R350 Frequency of micturition: Secondary | ICD-10-CM | POA: Diagnosis not present

## 2012-12-26 DIAGNOSIS — R39198 Other difficulties with micturition: Secondary | ICD-10-CM | POA: Diagnosis not present

## 2012-12-26 DIAGNOSIS — N400 Enlarged prostate without lower urinary tract symptoms: Secondary | ICD-10-CM | POA: Diagnosis not present

## 2012-12-26 DIAGNOSIS — R351 Nocturia: Secondary | ICD-10-CM | POA: Diagnosis not present

## 2012-12-27 ENCOUNTER — Other Ambulatory Visit: Payer: Self-pay | Admitting: *Deleted

## 2012-12-27 DIAGNOSIS — J90 Pleural effusion, not elsewhere classified: Secondary | ICD-10-CM

## 2012-12-27 DIAGNOSIS — Z23 Encounter for immunization: Secondary | ICD-10-CM | POA: Diagnosis not present

## 2012-12-29 ENCOUNTER — Ambulatory Visit: Payer: Medicare Other | Admitting: Cardiothoracic Surgery

## 2013-01-02 ENCOUNTER — Encounter: Payer: Self-pay | Admitting: Internal Medicine

## 2013-01-02 ENCOUNTER — Ambulatory Visit: Payer: Medicare Other

## 2013-01-04 ENCOUNTER — Telehealth (INDEPENDENT_AMBULATORY_CARE_PROVIDER_SITE_OTHER): Payer: Self-pay | Admitting: Surgery

## 2013-01-04 NOTE — Telephone Encounter (Signed)
I spoke with pt about getting sx scheduled for December. He would like to wait until January. Placed in pending. skm

## 2013-01-05 ENCOUNTER — Ambulatory Visit: Payer: Medicare Other | Admitting: Cardiothoracic Surgery

## 2013-01-05 ENCOUNTER — Other Ambulatory Visit: Payer: Self-pay | Admitting: *Deleted

## 2013-01-05 DIAGNOSIS — I2510 Atherosclerotic heart disease of native coronary artery without angina pectoris: Secondary | ICD-10-CM

## 2013-01-09 ENCOUNTER — Ambulatory Visit: Payer: Medicare Other

## 2013-01-10 ENCOUNTER — Ambulatory Visit (INDEPENDENT_AMBULATORY_CARE_PROVIDER_SITE_OTHER): Payer: Medicare Other | Admitting: *Deleted

## 2013-01-10 DIAGNOSIS — I4891 Unspecified atrial fibrillation: Secondary | ICD-10-CM

## 2013-01-12 ENCOUNTER — Other Ambulatory Visit: Payer: Self-pay

## 2013-01-16 ENCOUNTER — Ambulatory Visit: Payer: Medicare Other

## 2013-01-19 ENCOUNTER — Encounter: Payer: Self-pay | Admitting: Cardiothoracic Surgery

## 2013-01-19 ENCOUNTER — Ambulatory Visit
Admission: RE | Admit: 2013-01-19 | Discharge: 2013-01-19 | Disposition: A | Discharge status: Home / Self Care | Payer: Medicare Other | Source: Ambulatory Visit | Attending: Cardiothoracic Surgery | Admitting: Cardiothoracic Surgery

## 2013-01-19 ENCOUNTER — Ambulatory Visit (INDEPENDENT_AMBULATORY_CARE_PROVIDER_SITE_OTHER): Payer: Medicare Other | Admitting: Cardiothoracic Surgery

## 2013-01-19 VITALS — BP 108/78 | HR 84 | Resp 20 | Ht 67.0 in | Wt 156.0 lb

## 2013-01-19 DIAGNOSIS — Z09 Encounter for follow-up examination after completed treatment for conditions other than malignant neoplasm: Secondary | ICD-10-CM | POA: Diagnosis not present

## 2013-01-19 DIAGNOSIS — J9819 Other pulmonary collapse: Secondary | ICD-10-CM | POA: Diagnosis not present

## 2013-01-19 DIAGNOSIS — J90 Pleural effusion, not elsewhere classified: Secondary | ICD-10-CM | POA: Diagnosis not present

## 2013-01-19 NOTE — Progress Notes (Signed)
[address]       [place] [address]             [phone]             [name] [facility] Medical Record #[id number] Date of Birth: 06-04-1929  Craig Helling, MD [name], MD  Chief Complaint:   PostOp Follow Up Visit Chest Tube Placement  History of Present Illness:      Patient returns to the office today with followup chest x-ray. He was seen 3 months ago  in the office after hospitalization with a large right bloody pleural  effusion likely related to removal of infected pacemaker and subsequent reimplantation. He's has been improving since last seen . He is back exercising,  dyspnea on exertion is improved, he has no fever or chills.      History  Smoking status  . Former Smoker -- 1.00 packs/day for 40 years  . Types: Cigarettes  . Quit date: 03/10/1983  Smokeless tobacco  . Never Used       Allergies  Allergen Reactions  . Antihistamines, Diphenhydramine-Type Other (See Comments)    Causes difficulty in ability to urinate.    Current Outpatient Prescriptions  Medication Sig Dispense Refill  . amiodarone (PACERONE) 200 MG tablet Take 1 tablet (200 mg total) by mouth daily.  90 tablet  3  . carvedilol (COREG) 12.5 MG tablet Take 12.5 mg by mouth 2 (two) times daily with a meal.      . digoxin (LANOXIN) 0.125 MG tablet Every other day-pt takes 1/2 of a 0.25mg  tablet every other day      . finasteride (PROSCAR) 5 MG tablet Take 5 mg by mouth daily.       . fluticasone (FLONASE) 50 MCG/ACT nasal spray Place 2 sprays into the nose daily as needed.      Marland Kitchen lisinopril (PRINIVIL,ZESTRIL) 2.5 MG tablet Take 1 tablet (2.5 mg total) by mouth daily.  30 tablet  6  . magnesium oxide (MAG-OX) 400 MG tablet Take 400 mg by mouth daily.      . Multiple Vitamins-Minerals (MULTIVITAMIN WITH MINERALS) tablet Take 1 tablet by mouth daily.        . pantoprazole (PROTONIX) 40 MG tablet Take 40 mg by mouth daily.       . pravastatin  (PRAVACHOL) 80 MG tablet Take 1 tablet (80 mg total) by mouth daily.      . tamsulosin (FLOMAX) 0.4 MG CAPS Take 2 capsules by mouth daily.       Marland Kitchen warfarin (COUMADIN) 5 MG tablet Take 2.5 mg by mouth daily. 1 tablet (5 mg) on Sunday, 1/2 tab (2.5 mg) all others       No current facility-administered medications for this visit.       Physical Exam: BP 108/78  Pulse 84  Resp 20  Ht 5\' 7"  (1.702 m)  Wt 156 lb (70.761 kg)  BMI 24.43 kg/m2  SpO2 95%  General appearance: alert and cooperative Neurologic: intact Heart: regular rate and rhythm, S1, S2 normal, no murmur, click, rub or gallop Lungs: diminished breath sounds RLL Abdomen: soft, non-tender; bowel sounds normal; no masses,  no organomegaly Extremities: extremities normal, atraumatic, no cyanosis or edema and Homans sign is negative, no sign of DVT Wound: chest tube site ok, no pedal edema  Diagnostic Studies & Laboratory data:         Recent Radiology Findings: Dg Chest 2 View  01/19/2013   CLINICAL DATA:  Followup right pleural effusion.  EXAM: CHEST  2 VIEW  COMPARISON:  09/23/2012.  FINDINGS: The cardiac silhouette, mediastinal and hilar contours are within normal limits and stable. Stable pacer wires. There is a persistent but smaller right pleural effusion with minimal overlying atelectasis. No infiltrates, edema or worrisome pulmonary lesion. The bony thorax is intact.  IMPRESSION: Slight interval decrease in size of the right pleural effusion with stable overlying atelectasis.   Electronically Signed   By: [name] M.D.   On: 01/19/2013 10:19   Dg Chest 2 View  09/23/2012   *RADIOLOGY REPORT*  Clinical Data: Follow up pleural effusion  CHEST - 2 VIEW  Comparison: Prior chest x-ray 08/26/2012  Findings: Left subclavian approach biventricular cardiac rhythm maintenance device.  Leads project over the right atrium, right ventricle and within a cardiac vein overlying the left ventricle. Persistent small and likely  loculated right pleural effusion with associated basilar atelectasis.  No significant interval change compared to prior.  Cardiac and mediastinal contours are unchanged. No pneumothorax, pulmonary edema or new consolidation.  No acute osseous abnormality.  IMPRESSION:  Stable small, and likely loculated right pleural effusion with associated atelectasis.   Original Report Authenticated By: Malachy Moan, M.D.      Recent Labs: Lab Results  Component Value Date   WBC 7.4 07/15/2012   HGB 10.7* 07/15/2012   HCT 31.9* 07/15/2012   PLT 494* 07/15/2012   GLUCOSE 113* 12/15/2012   CHOL 136 09/02/2012   TRIG 125.0 09/02/2012   HDL 34.20* 09/02/2012   LDLCALC 77 09/02/2012   ALT 31 12/15/2012   AST 32 12/15/2012   NA 140 12/15/2012   K 4.7 12/15/2012   CL 103 12/15/2012   CREATININE 1.0 12/15/2012   BUN 20 12/15/2012   CO2 30 12/15/2012   TSH 3.65 12/15/2012   INR 3.0 01/10/2013      Assessment / Plan:      Small residual right pleural effusion after chest tube drainage of a large pleural  effusion. No indication for invasive procedure (VATS) at this point. Patient is doing well with almost completely cleared chest x-ray. I've not made a return appointment to see him but would be glad to see him at his or Dr. Cato Mulligan requested anytime.   Norfleet Capers B 01/19/2013 10:30 AM

## 2013-01-29 NOTE — Assessment & Plan Note (Signed)
Reviewed dr. Dennie Maizes note-  No further evaluation necessary

## 2013-01-29 NOTE — Assessment & Plan Note (Signed)
In SR by exam Continue Amio INR monitored monthly

## 2013-01-29 NOTE — Progress Notes (Signed)
Pleural effusion- reviewed dr. Dennie Maizes note  AFIB- patient on amio, no sxs  GERD- no sxs  PMR-- no recurrent sxs  BMs- has had trouble on and off for months.   Reviewed pmh, meds, shx,   Ros- no specific complaints  Exam- reviewed vitals  well-developed well-nourished male in no acute distress. HEENT exam atraumatic, normocephalic, neck supple without jugular venous distention. Chest clear to auscultation cardiac exam S1-S2 are regular. Abdominal exam overweight with bowel sounds, soft and nontender. Extremities no edema. Neurologic exam is alert with a normal gait.

## 2013-01-29 NOTE — Assessment & Plan Note (Signed)
No sxs and off prednisone No further evaluation necessary

## 2013-01-30 ENCOUNTER — Encounter: Payer: Self-pay | Admitting: Internal Medicine

## 2013-01-30 ENCOUNTER — Ambulatory Visit: Payer: Medicare Other | Admitting: Internal Medicine

## 2013-01-30 ENCOUNTER — Ambulatory Visit (INDEPENDENT_AMBULATORY_CARE_PROVIDER_SITE_OTHER): Payer: Medicare Other | Admitting: Internal Medicine

## 2013-01-30 VITALS — BP 102/64 | HR 76 | Temp 98.0°F | Ht 67.0 in | Wt 161.0 lb

## 2013-01-30 DIAGNOSIS — M353 Polymyalgia rheumatica: Secondary | ICD-10-CM | POA: Diagnosis not present

## 2013-01-30 DIAGNOSIS — I428 Other cardiomyopathies: Secondary | ICD-10-CM

## 2013-01-30 DIAGNOSIS — J90 Pleural effusion, not elsewhere classified: Secondary | ICD-10-CM

## 2013-01-30 DIAGNOSIS — I4891 Unspecified atrial fibrillation: Secondary | ICD-10-CM | POA: Diagnosis not present

## 2013-01-30 DIAGNOSIS — E785 Hyperlipidemia, unspecified: Secondary | ICD-10-CM

## 2013-01-30 MED ORDER — PRAVASTATIN SODIUM 80 MG PO TABS: 40.0000 mg | ORAL_TABLET | Freq: Every day | ORAL | Status: DC

## 2013-01-30 MED ORDER — CARVEDILOL 12.5 MG PO TABS: 6.2500 mg | ORAL_TABLET | Freq: Two times a day (BID) | ORAL | Status: DC

## 2013-01-30 NOTE — Patient Instructions (Signed)
pantoprazole - decrease to every other day for 2 weeks and then every 3rd day for 2 weeks and then stop.  Digoxin- stop it  Carvedilol- decrease to 6.25 mg twice daily.  Finasteride- stop it  See me 6 weeks.

## 2013-01-30 NOTE — Progress Notes (Signed)
Pre visit review using our clinic review tool, if applicable. No additional management support is needed unless otherwise documented below in the visit note. 

## 2013-01-31 NOTE — Assessment & Plan Note (Signed)
Reviewed last lipid panel. It was quite well controlled. Will decrease pravastatin half its current dose. He'll be taking 40 mg a pravastatin daily.

## 2013-01-31 NOTE — Assessment & Plan Note (Signed)
We spent a lot of time today reviewing medications. He would like to minimize medications and I tend to agree. He needs this done amiodarone and warfarin. His blood pressure is quite low. I will decrease carvedilol to 6.25 mg by mouth twice a day. Continue lisinopril for now. Will discontinue digoxin.  Other medications reviewed. Will minimize the use of proton pump inhibitor. I asked him to stop magnesium oxide as this may be contributing to frequent bowel movements and fecal incontinence.

## 2013-02-06 ENCOUNTER — Encounter: Payer: Medicare Other | Admitting: *Deleted

## 2013-02-07 ENCOUNTER — Telehealth: Payer: Self-pay | Admitting: Cardiology

## 2013-02-07 ENCOUNTER — Ambulatory Visit (INDEPENDENT_AMBULATORY_CARE_PROVIDER_SITE_OTHER): Payer: Medicare Other | Admitting: General Practice

## 2013-02-07 DIAGNOSIS — I4891 Unspecified atrial fibrillation: Secondary | ICD-10-CM

## 2013-02-07 DIAGNOSIS — E785 Hyperlipidemia, unspecified: Secondary | ICD-10-CM

## 2013-02-07 DIAGNOSIS — Z950 Presence of cardiac pacemaker: Secondary | ICD-10-CM

## 2013-02-07 DIAGNOSIS — I428 Other cardiomyopathies: Secondary | ICD-10-CM

## 2013-02-07 LAB — POCT INR: INR: 2

## 2013-02-07 MED ORDER — LISINOPRIL 2.5 MG PO TABS: 2.5000 mg | ORAL_TABLET | Freq: Every day | ORAL | Status: DC

## 2013-02-07 NOTE — Telephone Encounter (Signed)
Called requesting 90 day refill on Lisinopril to be sent to [facility]. Sent RX

## 2013-02-07 NOTE — Telephone Encounter (Signed)
New message     Need clearance on taking lisinopril----what is the dosage he is supposed to be taking?

## 2013-02-07 NOTE — Telephone Encounter (Signed)
Follow Up   Pt states he has more to discuss and requests a call back

## 2013-02-14 ENCOUNTER — Encounter: Payer: Self-pay | Admitting: *Deleted

## 2013-03-07 ENCOUNTER — Ambulatory Visit (INDEPENDENT_AMBULATORY_CARE_PROVIDER_SITE_OTHER): Payer: Medicare Other | Admitting: *Deleted

## 2013-03-07 DIAGNOSIS — I4891 Unspecified atrial fibrillation: Secondary | ICD-10-CM | POA: Diagnosis not present

## 2013-03-07 LAB — POCT INR: INR: 2.5

## 2013-03-14 ENCOUNTER — Ambulatory Visit: Payer: Medicare Other | Admitting: Internal Medicine

## 2013-03-15 ENCOUNTER — Telehealth: Payer: Self-pay | Admitting: Internal Medicine

## 2013-03-15 NOTE — Telephone Encounter (Signed)
error 

## 2013-03-21 ENCOUNTER — Other Ambulatory Visit: Payer: Medicare Other

## 2013-03-21 ENCOUNTER — Other Ambulatory Visit: Payer: Self-pay | Admitting: Internal Medicine

## 2013-03-21 ENCOUNTER — Ambulatory Visit (INDEPENDENT_AMBULATORY_CARE_PROVIDER_SITE_OTHER): Payer: Medicare Other | Admitting: Cardiology

## 2013-03-21 ENCOUNTER — Other Ambulatory Visit (INDEPENDENT_AMBULATORY_CARE_PROVIDER_SITE_OTHER): Payer: Medicare Other

## 2013-03-21 ENCOUNTER — Encounter: Payer: Self-pay | Admitting: Cardiology

## 2013-03-21 VITALS — BP 122/72 | HR 70 | Ht 67.0 in | Wt 161.0 lb

## 2013-03-21 DIAGNOSIS — I509 Heart failure, unspecified: Secondary | ICD-10-CM | POA: Diagnosis not present

## 2013-03-21 DIAGNOSIS — I4891 Unspecified atrial fibrillation: Secondary | ICD-10-CM

## 2013-03-21 DIAGNOSIS — I428 Other cardiomyopathies: Secondary | ICD-10-CM

## 2013-03-21 DIAGNOSIS — I5022 Chronic systolic (congestive) heart failure: Secondary | ICD-10-CM | POA: Insufficient documentation

## 2013-03-21 LAB — HEPATIC FUNCTION PANEL
ALK PHOS: 57 U/L (ref 39–117)
ALT: 32 U/L (ref 0–53)
AST: 24 U/L (ref 0–37)
Albumin: 3.9 g/dL (ref 3.5–5.2)
BILIRUBIN DIRECT: 0.2 mg/dL (ref 0.0–0.3)
Total Bilirubin: 1 mg/dL (ref 0.3–1.2)
Total Protein: 6.9 g/dL (ref 6.0–8.3)

## 2013-03-21 LAB — BASIC METABOLIC PANEL
BUN: 16 mg/dL (ref 6–23)
CHLORIDE: 106 meq/L (ref 96–112)
CO2: 29 mEq/L (ref 19–32)
Calcium: 9.1 mg/dL (ref 8.4–10.5)
Creatinine, Ser: 1.1 mg/dL (ref 0.4–1.5)
GFR: 70.35 mL/min (ref >60.00)
Glucose, Bld: 88 mg/dL (ref 70–99)
POTASSIUM: 4.7 meq/L (ref 3.5–5.1)
Sodium: 139 mEq/L (ref 135–145)

## 2013-03-21 LAB — TSH: TSH: 3.94 u[IU]/mL (ref 0.35–5.50)

## 2013-03-21 NOTE — Patient Instructions (Signed)
Your physician recommends that you return for lab work --liver profile/BMET/TSH. You can schedule this to be done at [facility] wants you to follow-up in: 4 months with Dr Aundra Dubin. (May 2015).You will receive a reminder letter in the mail two months in advance. If you don't receive a letter, please call our office to schedule the follow-up appointment.

## 2013-03-21 NOTE — Progress Notes (Signed)
Patient ID: [name], male   DOB: [birth year], 80 y.o.   MRN: [id number] PCP: Dr. Leanne Chang  80 yo with history of paroxysmal atrial fibrillation, nonischemic cardiomyopathy, and complete heart block with St Jude CRT-P system presents for cardiology followup.  He was admitted in 4/14 with pacemaker pocket infection.  His pacemaker was removed and temporary-permanent device was placed.  Later, he had the temporary-permanent device removed and a new CRT-P device was placed. He developed dyspnea post-operatively and was found to have a large right-sided pleural effusion.  He was admitted and got a chest tube for drainage. He had followup with Dr. Servando Snare and it was decided that he would not need VATS.    He has been doing well recently.  He is tolerating a lower dose of lisinopril with no problems.  He is off digoxin (had a high digoxin level) and has not noticed any change in his symptoms. 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 He notices mild dyspnea walking up a flight of steps.  No chest pain.  No lightheadedness.   ECG: a-paced, BiV-paced  Labs (7/13); LDL 65, HDL 38 Labs (3/14): TSH normal Labs (4/14):  ALT 61, AST 62 Labs (5/14): K 4.5, creatinine 0.9, BNP 727, digoxin 0.9 Labs (6/14): LDL 77, HDL 34, K 4, creatinine 0.9, TSH normal Labs (10/14): digoxin 2.1, K 4.7, creatinine 1.0, TSH normal, LFTs normal  PMH: 1. Low back pain 2. Hyperlipidemia 3. GERD 4. BPH 5. Polymyalgia rheumatica 6. Atrial fibrillation: Failed Tikosyn in the past.  Paroxysmal, on amiodarone and warfarin.  DCCV in 4/13, 7/13, 10/13.   7. Transaminitis: Mild, attributed to amiodarone.  8. Complete heart block s/p St Jude CRT-P device.  Patient developed PCM pocket infection in 4/14 and had his first system removed with placement of a temporary permanent PCM.  He later had CRT-P device replaced.  This was complicated by large right-sided hemorrhagic pleural  effusion requiring chest tube.   9. Nonischemic cardiomyopathy: Echo (9/13) with EF 40-45%, diffuse HK, mild MR.  Mild CAD only on prior LHC.   SH: Married, prior smoker (quit 1985), no ETOH x 15 yrs, retired.    FH: Father with MI at 75, mother with AAA.   ROS: All systems reviewed and negative except as per HPI.   Current Outpatient Prescriptions  Medication Sig Dispense Refill  . amiodarone (PACERONE) 200 MG tablet Take 1 tablet (200 mg total) by mouth daily.  90 tablet  3  . carvedilol (COREG) 12.5 MG tablet Take 0.5 tablets (6.25 mg total) by mouth 2 (two) times daily with a meal.      . fluticasone (FLONASE) 50 MCG/ACT nasal spray Place 2 sprays into the nose daily as needed.      Marland Kitchen lisinopril (PRINIVIL,ZESTRIL) 2.5 MG tablet Take 1 tablet (2.5 mg total) by mouth daily.  90 tablet  3  . Multiple Vitamins-Minerals (MULTIVITAMIN WITH MINERALS) tablet Take 1 tablet by mouth daily.        . pravastatin (PRAVACHOL) 80 MG tablet Take 0.5 tablets (40 mg total) by mouth daily.      . tamsulosin (FLOMAX) 0.4 MG CAPS Take 2 capsules by mouth daily.       Marland Kitchen warfarin (COUMADIN) 5 MG tablet Take 2.5 mg by mouth daily. 1 tablet (5 mg) on Sunday, 1/2 tab (2.5 mg) all others       No current facility-administered medications for this visit.  BP 122/72  Pulse 70  Ht 5\' 7"  (1.702 m)  Wt 73.029 kg (161 lb)  BMI 25.21 kg/m2 General: NAD Neck: No JVD, no thyromegaly or thyroid nodule.  Lungs: CTAB  CV: Nondisplaced PMI.  Heart regular S1/S2, no S3/S4, no murmur.  No peripheral edema.  No carotid bruit.  Normal pedal pulses.  Abdomen: Soft, nontender, no hepatosplenomegaly, no distention.  Neurologic: Alert and oriented x 3.  Psych: Normal affect. Extremities: No clubbing or cyanosis.   Assessment/Plan: 1. Nonischemic cardiomyopathy: NYHA class II without evidence for significant volume overload on exam.  EF 40-45% on last echo.  He has a CRT-P device. - He can continue to stay off Lasix.   - He is off digoxin (level was high) with no changes in his symptoms. - Continue current Coreg and lisinopril.  He has been unable to tolerate a higher lisinopril dose due to fatigue. 2. Atrial fibrillation: Paroxysmal.  He is a-pacing today on amiodarone.   - We discussed switching to a NOAC, but he has had no problems with warfarin and feels comfortable continuing warfarin rather than switching.  - Check LFTs and TSH today given amiodarone use.  He will need a yearly eye exam. 3. Hyperlipidemia: History of nonobstructive CAD.  Good lipids on pravastatin.    [name] 03/21/2013

## 2013-03-24 ENCOUNTER — Ambulatory Visit (INDEPENDENT_AMBULATORY_CARE_PROVIDER_SITE_OTHER): Payer: Medicare Other | Admitting: Internal Medicine

## 2013-03-24 ENCOUNTER — Encounter: Payer: Self-pay | Admitting: Internal Medicine

## 2013-03-24 VITALS — BP 114/72 | HR 76 | Temp 97.6°F | Ht 67.0 in | Wt 164.0 lb

## 2013-03-24 DIAGNOSIS — I428 Other cardiomyopathies: Secondary | ICD-10-CM | POA: Diagnosis not present

## 2013-03-24 MED ORDER — CALCIUM POLYCARBOPHIL 625 MG PO TABS: 1250.0000 mg | ORAL_TABLET | Freq: Two times a day (BID) | ORAL | Status: DC

## 2013-03-24 NOTE — Progress Notes (Signed)
Describes stool leakage  A/p metamucil-

## 2013-03-24 NOTE — Patient Instructions (Signed)
Send me a message in two weeks and let me know if bowel habits have changed-- better/worse?

## 2013-03-26 NOTE — Assessment & Plan Note (Signed)
He is doing quite well He has recovered from pleural effusion afib- controlled and no bleeding complications  Continue same meds See me 3 months

## 2013-03-27 ENCOUNTER — Other Ambulatory Visit: Payer: Self-pay | Admitting: Internal Medicine

## 2013-03-28 ENCOUNTER — Telehealth: Payer: Self-pay | Admitting: Internal Medicine

## 2013-03-28 NOTE — Telephone Encounter (Signed)
Patient Information:  Caller Name: Derrius  Phone: [phone]  Patient: [name], [name]  Gender: Male  DOB: 01-Apr-1929  Age: 80 Years  PCP: [name] (Adults only)  Office Follow Up:  Does the office need to follow up with this patient?: Yes  Instructions For The Office: Caller wants to discuss with PCP; he asks if he should come to this office or if he needs to see cardiologist.  He declined to schedule appointment.   Symptoms  Reason For Call & Symptoms: Shortness of breath on exertion.  Onset 03/24/13; worse after exercise, but he did not report to MD.  It has gotten a little worse and it has been persistent.  Emergent symptoms ruled out.  Go to Office Now due to Mild difficulty breathing (minimal shortness of breath at rest, short of breath with walking, pulse < 100) of new onset or worse than normal.  Reviewed Health History In EMR: Yes  Reviewed Medications In EMR: Yes  Reviewed Allergies In EMR: Yes  Reviewed Surgeries / Procedures: Yes  Date of Onset of Symptoms: 03/24/2013  Guideline(s) Used:  Breathing Difficulty  Disposition Per Guideline:   Go to Office Now  Reason For Disposition Reached:   Mild difficulty breathing (e.g., minimal/no SOB at rest, SOB with walking, pulse < 100) of new onset or worse than normal  Advice Given:  General Care Advice for Breathing Difficulty:  Find position of greatest comfort. For most patients the best position is semi-upright (e.g., sitting up in a comfortable chair or lying back against pillows).  Call Back If:  Severe difficulty breathing occurs  You become worse.  Patient Refused Recommendation:  Patient Refused Care Advice  Caller wants to discuss with PCP; he asks if he should come to this office or if he needs to see cardiologist.

## 2013-03-31 NOTE — Telephone Encounter (Signed)
See how he is doing 

## 2013-03-31 NOTE — Telephone Encounter (Signed)
Pt is "feeling great".  He states he is unsure of what happened but he is better now

## 2013-04-03 ENCOUNTER — Ambulatory Visit (INDEPENDENT_AMBULATORY_CARE_PROVIDER_SITE_OTHER): Payer: Medicare Other | Admitting: *Deleted

## 2013-04-03 DIAGNOSIS — I4891 Unspecified atrial fibrillation: Secondary | ICD-10-CM | POA: Diagnosis not present

## 2013-04-03 LAB — POCT INR: INR: 3.1

## 2013-04-28 ENCOUNTER — Encounter: Payer: Self-pay | Admitting: Internal Medicine

## 2013-05-01 ENCOUNTER — Ambulatory Visit (INDEPENDENT_AMBULATORY_CARE_PROVIDER_SITE_OTHER): Payer: Medicare Other | Admitting: Pharmacist

## 2013-05-01 DIAGNOSIS — I4891 Unspecified atrial fibrillation: Secondary | ICD-10-CM

## 2013-05-01 LAB — POCT INR: INR: 3.7

## 2013-05-13 ENCOUNTER — Other Ambulatory Visit: Payer: Self-pay | Admitting: Internal Medicine

## 2013-05-15 ENCOUNTER — Ambulatory Visit (INDEPENDENT_AMBULATORY_CARE_PROVIDER_SITE_OTHER): Payer: Medicare Other

## 2013-05-15 ENCOUNTER — Encounter: Payer: Self-pay | Admitting: Internal Medicine

## 2013-05-15 DIAGNOSIS — I4891 Unspecified atrial fibrillation: Secondary | ICD-10-CM | POA: Diagnosis not present

## 2013-05-15 LAB — POCT INR: INR: 2.5

## 2013-05-17 ENCOUNTER — Encounter: Payer: Self-pay | Admitting: Internal Medicine

## 2013-05-18 ENCOUNTER — Telehealth: Payer: Self-pay | Admitting: Internal Medicine

## 2013-05-18 NOTE — Telephone Encounter (Signed)
Patient Information:  Caller Name: Kaylen  Phone: [phone]  Patient: [name], [name]  Gender: Male  DOB: Jan 12, 1930  Age: 79 Years  PCP: [name] (Adults only)  Office Follow Up:  Does the office need to follow up with this patient?: No  Instructions For The Office: N/A   Symptoms  Reason For Call & Symptoms: Onset 2 weeks ago of SOB chest tightness.  Reviewed Health History In EMR: Yes  Reviewed Medications In EMR: Yes  Reviewed Allergies In EMR: Yes  Reviewed Surgeries / Procedures: Yes  Date of Onset of Symptoms: 05/04/2013  Guideline(s) Used:  Breathing Difficulty  Disposition Per Guideline:   See Within 2 Weeks in Office  Reason For Disposition Reached:   Mild longstanding difficulty breathing (e.g., speaks in phrases, SOB even at rest, pulse 100-120) and same as normal  Advice Given:  Call Back If:  Severe difficulty breathing occurs  Fever more than 100.5 F (38.1 C)  You become worse.  General Care Advice for Breathing Difficulty:  Find position of greatest comfort. For most patients the best position is semi-upright (e.g., sitting up in a comfortable chair or lying back against pillows).  Elevate head of bed (e.g., use pillows or place blocks under bed).  Avoid smoke or fume exposure.  Create a draft (e.g., use a fan directed at the face, or open a window).  Keep room temperature slightly on the cool side.  Limit activities or space activities apart during the day. Prioritize activities.  Use a humidifier.  Call Back If:  Severe difficulty breathing occurs  Fever more than 100.5 F (38.1 C)  You become worse.  Patient Will Follow Care Advice:  YES  Appointment Scheduled:  05/23/2013 08:30:00 Appointment Scheduled Provider:  Carolann Littler [facility])

## 2013-05-18 NOTE — Telephone Encounter (Signed)
FYI

## 2013-05-23 ENCOUNTER — Encounter: Payer: Self-pay | Admitting: Internal Medicine

## 2013-05-23 ENCOUNTER — Ambulatory Visit: Payer: Medicare Other | Admitting: Family Medicine

## 2013-05-23 ENCOUNTER — Ambulatory Visit (INDEPENDENT_AMBULATORY_CARE_PROVIDER_SITE_OTHER): Payer: Medicare Other | Admitting: Internal Medicine

## 2013-05-23 ENCOUNTER — Ambulatory Visit (INDEPENDENT_AMBULATORY_CARE_PROVIDER_SITE_OTHER)
Admission: RE | Admit: 2013-05-23 | Discharge: 2013-05-23 | Disposition: A | Discharge status: Home / Self Care | Payer: Medicare Other | Source: Ambulatory Visit | Attending: Internal Medicine | Admitting: Internal Medicine

## 2013-05-23 VITALS — BP 90/62 | HR 78 | Temp 97.9°F | Ht 67.0 in | Wt 161.0 lb

## 2013-05-23 DIAGNOSIS — R0609 Other forms of dyspnea: Secondary | ICD-10-CM

## 2013-05-23 DIAGNOSIS — R0600 Dyspnea, unspecified: Secondary | ICD-10-CM

## 2013-05-23 DIAGNOSIS — J90 Pleural effusion, not elsewhere classified: Secondary | ICD-10-CM

## 2013-05-23 DIAGNOSIS — Z950 Presence of cardiac pacemaker: Secondary | ICD-10-CM

## 2013-05-23 DIAGNOSIS — R0989 Other specified symptoms and signs involving the circulatory and respiratory systems: Secondary | ICD-10-CM | POA: Diagnosis not present

## 2013-05-23 DIAGNOSIS — E785 Hyperlipidemia, unspecified: Secondary | ICD-10-CM | POA: Diagnosis not present

## 2013-05-23 DIAGNOSIS — I428 Other cardiomyopathies: Secondary | ICD-10-CM

## 2013-05-23 LAB — HEPATIC FUNCTION PANEL
ALBUMIN: 3.7 g/dL (ref 3.5–5.2)
ALT: 28 U/L (ref 0–53)
AST: 29 U/L (ref 0–37)
Alkaline Phosphatase: 61 U/L (ref 39–117)
Bilirubin, Direct: 0.1 mg/dL (ref 0.0–0.3)
Total Bilirubin: 1 mg/dL (ref 0.3–1.2)
Total Protein: 6.4 g/dL (ref 6.0–8.3)

## 2013-05-23 LAB — CBC WITH DIFFERENTIAL/PLATELET
BASOS ABS: 0 10*3/uL (ref 0.0–0.1)
Basophils Relative: 0.6 % (ref 0.0–3.0)
EOS ABS: 0.1 10*3/uL (ref 0.0–0.7)
Eosinophils Relative: 2 % (ref 0.0–5.0)
HEMATOCRIT: 38.8 % — AB (ref 39.0–52.0)
HEMOGLOBIN: 12.7 g/dL — AB (ref 13.0–17.0)
LYMPHS ABS: 1.3 10*3/uL (ref 0.7–4.0)
Lymphocytes Relative: 20.4 % (ref 12.0–46.0)
MCHC: 32.7 g/dL (ref 30.0–36.0)
MCV: 91.2 fl (ref 78.0–100.0)
Monocytes Absolute: 0.4 10*3/uL (ref 0.1–1.0)
Monocytes Relative: 6.5 % (ref 3.0–12.0)
NEUTROS ABS: 4.4 10*3/uL (ref 1.4–7.7)
Neutrophils Relative %: 70.5 % (ref 43.0–77.0)
Platelets: 178 10*3/uL (ref 150.0–400.0)
RBC: 4.25 Mil/uL (ref 4.22–5.81)
RDW: 16.2 % — AB (ref 11.5–14.6)
WBC: 6.3 10*3/uL (ref 4.5–10.5)

## 2013-05-23 LAB — BASIC METABOLIC PANEL
BUN: 17 mg/dL (ref 6–23)
CALCIUM: 8.8 mg/dL (ref 8.4–10.5)
CO2: 26 mEq/L (ref 19–32)
CREATININE: 1.1 mg/dL (ref 0.4–1.5)
Chloride: 105 mEq/L (ref 96–112)
GFR: 69.57 mL/min (ref >60.00)
Glucose, Bld: 112 mg/dL — ABNORMAL HIGH (ref 70–99)
Potassium: 4 mEq/L (ref 3.5–5.1)
Sodium: 136 mEq/L (ref 135–145)

## 2013-05-23 LAB — BRAIN NATRIURETIC PEPTIDE: Pro B Natriuretic peptide (BNP): 555 pg/mL — ABNORMAL HIGH (ref 0.0–100.0)

## 2013-05-23 NOTE — Progress Notes (Signed)
Pre visit review using our clinic review tool, if applicable. No additional management support is needed unless otherwise documented below in the visit note. 

## 2013-05-23 NOTE — Progress Notes (Signed)
Drove back from [facility]. The following day he felt SOB ("different than usual"). He is on warfarin for AFIB.  He also has noted some cough - around the same time as he noted SOB.   Has noted decreased exercise capacity especially when walking up hill.  Has noted some LE edema bilaterally.   NO chest pain, NO PND, NO orthopnea.  A/p dyspnea- ? Cause Part of work up is ddimer- if positive there is no need to react today/tonight (already on warfarin)

## 2013-05-24 LAB — D-DIMER, QUANTITATIVE: D-Dimer, Quant: 0.44 ug/mL-FEU (ref 0.00–0.48)

## 2013-05-30 ENCOUNTER — Encounter: Payer: Self-pay | Admitting: Internal Medicine

## 2013-05-30 ENCOUNTER — Other Ambulatory Visit: Payer: Self-pay | Admitting: *Deleted

## 2013-05-30 MED ORDER — FUROSEMIDE 20 MG PO TABS: ORAL_TABLET | ORAL | Status: DC

## 2013-06-05 ENCOUNTER — Ambulatory Visit (INDEPENDENT_AMBULATORY_CARE_PROVIDER_SITE_OTHER): Payer: Medicare Other | Admitting: Pharmacist

## 2013-06-05 DIAGNOSIS — I4891 Unspecified atrial fibrillation: Secondary | ICD-10-CM

## 2013-06-05 LAB — POCT INR: INR: 3

## 2013-06-19 ENCOUNTER — Encounter: Payer: Self-pay | Admitting: Internal Medicine

## 2013-06-19 DIAGNOSIS — R0600 Dyspnea, unspecified: Secondary | ICD-10-CM

## 2013-06-21 ENCOUNTER — Other Ambulatory Visit (INDEPENDENT_AMBULATORY_CARE_PROVIDER_SITE_OTHER): Payer: Medicare Other

## 2013-06-21 DIAGNOSIS — R0989 Other specified symptoms and signs involving the circulatory and respiratory systems: Principal | ICD-10-CM

## 2013-06-21 DIAGNOSIS — R0609 Other forms of dyspnea: Secondary | ICD-10-CM | POA: Diagnosis not present

## 2013-06-21 LAB — BRAIN NATRIURETIC PEPTIDE: Pro B Natriuretic peptide (BNP): 482 pg/mL — ABNORMAL HIGH (ref 0.0–100.0)

## 2013-06-27 ENCOUNTER — Ambulatory Visit (INDEPENDENT_AMBULATORY_CARE_PROVIDER_SITE_OTHER): Payer: Medicare Other | Admitting: Internal Medicine

## 2013-06-27 DIAGNOSIS — R0609 Other forms of dyspnea: Secondary | ICD-10-CM | POA: Diagnosis not present

## 2013-06-27 DIAGNOSIS — R0600 Dyspnea, unspecified: Secondary | ICD-10-CM

## 2013-06-27 DIAGNOSIS — R0989 Other specified symptoms and signs involving the circulatory and respiratory systems: Secondary | ICD-10-CM

## 2013-06-27 NOTE — Progress Notes (Signed)
PFT done today. 

## 2013-06-28 ENCOUNTER — Encounter: Payer: Self-pay | Admitting: Internal Medicine

## 2013-06-28 ENCOUNTER — Ambulatory Visit (INDEPENDENT_AMBULATORY_CARE_PROVIDER_SITE_OTHER): Payer: Medicare Other | Admitting: Internal Medicine

## 2013-06-28 VITALS — BP 96/62 | HR 88 | Temp 97.7°F | Ht 67.0 in | Wt 156.0 lb

## 2013-06-28 DIAGNOSIS — T462X1A Poisoning by other antidysrhythmic drugs, accidental (unintentional), initial encounter: Secondary | ICD-10-CM

## 2013-06-28 LAB — PULMONARY FUNCTION TEST
DL/VA % pred: 61 %
DL/VA: 2.65 ml/min/mmHg/L
DLCO UNC % PRED: 50 %
DLCO unc: 13.59 ml/min/mmHg
FEF 25-75 PRE: 1.24 L/s
FEF 25-75 Post: 1.26 L/sec
FEF2575-%Change-Post: 1 %
FEF2575-%PRED-PRE: 82 %
FEF2575-%Pred-Post: 84 %
FEV1-%Change-Post: 0 %
FEV1-%PRED-PRE: 90 %
FEV1-%Pred-Post: 91 %
FEV1-PRE: 2.06 L
FEV1-Post: 2.08 L
FEV1FVC-%Change-Post: 2 %
FEV1FVC-%Pred-Pre: 97 %
FEV6-%CHANGE-POST: 0 %
FEV6-%PRED-POST: 95 %
FEV6-%Pred-Pre: 96 %
FEV6-Post: 2.9 L
FEV6-Pre: 2.93 L
FEV6FVC-%Change-Post: 0 %
FEV6FVC-%Pred-Post: 107 %
FEV6FVC-%Pred-Pre: 106 %
FVC-%CHANGE-POST: -1 %
FVC-%PRED-POST: 89 %
FVC-%Pred-Pre: 90 %
FVC-POST: 2.92 L
FVC-Pre: 2.98 L
POST FEV1/FVC RATIO: 71 %
POST FEV6/FVC RATIO: 99 %
Pre FEV1/FVC ratio: 69 %
Pre FEV6/FVC Ratio: 98 %
RV % PRED: 92 %
RV: 2.29 L
TLC % pred: 87 %
TLC: 5.49 L

## 2013-06-28 NOTE — Progress Notes (Signed)
Pre visit review using our clinic review tool, if applicable. No additional management support is needed unless otherwise documented below in the visit note. 

## 2013-06-29 ENCOUNTER — Encounter: Payer: Self-pay | Admitting: Internal Medicine

## 2013-06-29 ENCOUNTER — Other Ambulatory Visit: Payer: Self-pay | Admitting: Internal Medicine

## 2013-06-30 NOTE — Progress Notes (Signed)
Still with SOB No new complaints  on AMIo-  Decreased DLCO  Discussed with dr. Lovena Le Will d/ amio  Total time 30 minutes all ftf or coordinating care and talking with phsyciaisn  Reviewed labs/orders

## 2013-07-02 ENCOUNTER — Encounter: Payer: Self-pay | Admitting: Internal Medicine

## 2013-07-03 ENCOUNTER — Ambulatory Visit (INDEPENDENT_AMBULATORY_CARE_PROVIDER_SITE_OTHER): Payer: Medicare Other | Admitting: Surgery

## 2013-07-03 ENCOUNTER — Ambulatory Visit (INDEPENDENT_AMBULATORY_CARE_PROVIDER_SITE_OTHER): Payer: Medicare Other | Admitting: *Deleted

## 2013-07-03 ENCOUNTER — Encounter: Payer: Self-pay | Admitting: Internal Medicine

## 2013-07-03 ENCOUNTER — Other Ambulatory Visit (INDEPENDENT_AMBULATORY_CARE_PROVIDER_SITE_OTHER): Payer: Self-pay | Admitting: Surgery

## 2013-07-03 ENCOUNTER — Encounter (INDEPENDENT_AMBULATORY_CARE_PROVIDER_SITE_OTHER): Payer: Self-pay | Admitting: Surgery

## 2013-07-03 ENCOUNTER — Encounter (INDEPENDENT_AMBULATORY_CARE_PROVIDER_SITE_OTHER): Payer: Self-pay | Admitting: General Surgery

## 2013-07-03 VITALS — BP 125/88 | HR 88 | Temp 98.6°F | Resp 16 | Ht 66.0 in | Wt 156.8 lb

## 2013-07-03 DIAGNOSIS — K4091 Unilateral inguinal hernia, without obstruction or gangrene, recurrent: Secondary | ICD-10-CM

## 2013-07-03 DIAGNOSIS — I4891 Unspecified atrial fibrillation: Secondary | ICD-10-CM | POA: Diagnosis not present

## 2013-07-03 DIAGNOSIS — K458 Other specified abdominal hernia without obstruction or gangrene: Secondary | ICD-10-CM

## 2013-07-03 LAB — POCT INR: INR: 3.1

## 2013-07-03 NOTE — Progress Notes (Signed)
[name] is a 80 y.o. male. Referred by Dr. [name] for evaluation of recurrent right inguinal hernia  Cards - Dr. Aundra Dubin Dr. Cristopher Peru  CT - Dr. Servando Snare  GU - Dr. Lawerance Bach   HPI  This is an 80 year-old male who is status post open repair of a large indirect right inguinal hernia in 2012. The patient had been doing well until recently. He and his wife moved from their home to an apartment at [employer] During this move he has to do some heavy lifting. He has developed a palpable bulge in his right groin. It remains reducible.He denies any obstructive symptoms. Over the last couple of weeks, he has resumed his regular exercise regimen and this area in his groin actually feels better.  He has had a lot of medical issues this year including having his pacemaker replaced. This was complicated by a bloody pleural effusion. He seems to be doing much better now.  Past Medical History   Diagnosis  Date   .  Coronary artery disease    .  Cardiomyopathy  primary-nonischemic EF 45%   .  Hyperlipidemia    .  GERD (gastroesophageal reflux disease)    .  Polymyalgia rheumatica    .  Sick sinus syndrome    .  Atrial fibrillation    .  Dysphagia    .  Hearing loss, mixed, bilateral    .  Increased prostate specific antigen (PSA) velocity    .  Lumbar back pain    .  BPH (benign prostatic hypertrophy)    .  Breast mass      "on both sides" (06/28/2012)   .  CHF (congestive heart failure)    .  Elevated liver enzymes    .  Esophageal stricture    .  Pacemaker    .  Arthritis      "some" (06/28/2012)   .  Pacemaker infection      06/28/12    Past Surgical History   Procedure  Laterality  Date   .  Cataract extraction w/ intraocular lens implant, bilateral  Bilateral    .  Umbilical hernia repair     .  Insert / replace / remove pacemaker     .  Inguinal hernia repair  Right  2012     RIH   .  Umbilical hernia repair     .  Cardioversion   06/17/2011     Procedure:  CARDIOVERSION; Surgeon: Lelon Perla, MD; Location: [place]; Service: Cardiovascular; Laterality: N/A;   .  Cardioversion   09/09/2011     Procedure: CARDIOVERSION; Surgeon: Carlena Bjornstad, MD; Location: [place]; Service: Cardiovascular; Laterality: N/A;   .  Cardioversion   01/01/2012     Procedure: CARDIOVERSION; Surgeon: Hillary Bow, MD; Location: [facility] ENDOSCOPY; Service: Cardiovascular; Laterality: N/A;   .  Tee without cardioversion   01/01/2012     Procedure: TRANSESOPHAGEAL ECHOCARDIOGRAM (TEE); Surgeon: Fay Records, MD; Location: [place]; Service: Cardiovascular; Laterality: N/A;   .  Eye surgery     .  Generator removal  Left  06/15/2012     Procedure: GENERATOR REMOVAL; Surgeon: Evans Lance, MD; Location: [place]; Service: Cardiovascular; Laterality: Left;   .  Pacemaker generator change  N/A  06/15/2012     Procedure: PACEMAKER GENERATOR CHANGE; Surgeon: Evans Lance, MD; Location: [place]; Service: Cardiovascular; Laterality: N/A;   .  Tonsillectomy and adenoidectomy     .  Cardiac catheterization      Family History   Problem  Relation  Age of Onset   .  Heart attack  Father  25     deceased   .  Aneurysm  Mother      deceased AAA   .  Hypertension  Mother    .  Other  Brother      deceased at birth   Social History  History   Substance Use Topics   .  Smoking status:  Former Smoker -- 1.00 packs/day for 40 years     Types:  Cigarettes     Quit date:  03/10/1983   .  Smokeless tobacco:  Never Used   .  Alcohol Use:  No    Allergies   Allergen  Reactions   .  Antihistamines, Diphenhydramine-Type  Other (See Comments)     Causes difficulty in ability to urinate.    Current Outpatient Prescriptions   Medication  Sig  Dispense  Refill   .  amiodarone (PACERONE) 200 MG tablet  Take 1 tablet (200 mg total) by mouth daily.  90 tablet  3   .  carvedilol (COREG) 12.5 MG tablet  Take 12.5 mg by mouth 2 (two) times daily with a meal.     .  digoxin (LANOXIN) 0.25 MG  tablet  Take 0.5 tablets (125 mcg total) by mouth daily.  90 tablet  3   .  finasteride (PROSCAR) 5 MG tablet  Take 5 mg by mouth daily.     .  fluticasone (FLONASE) 50 MCG/ACT nasal spray  Place 2 sprays into the nose daily as needed.     .  magnesium oxide (MAG-OX) 400 MG tablet  Take 400 mg by mouth daily.     .  Multiple Vitamins-Minerals (MULTIVITAMIN WITH MINERALS) tablet  Take 1 tablet by mouth daily.     .  pantoprazole (PROTONIX) 40 MG tablet  Take 40 mg by mouth daily.     .  pravastatin (PRAVACHOL) 80 MG tablet  Take 1 tablet (80 mg total) by mouth daily.     Marland Kitchen  PROAIR HFA 108 (90 BASE) MCG/ACT inhaler  Inhale 1 puff into the lungs every 6 (six) hours as needed.     .  tamsulosin (FLOMAX) 0.4 MG CAPS  Take 2 capsules by mouth daily.     Marland Kitchen  warfarin (COUMADIN) 5 MG tablet  Take 2.5 mg by mouth daily. 1 tablet (5 mg) on Sunday, 1/2 tab (2.5 mg) all others     .  lisinopril (PRINIVIL,ZESTRIL) 2.5 MG tablet  Take 1 tablet (2.5 mg total) by mouth daily.  30 tablet  6    No current facility-administered medications for this visit.   Review of Systems  Review of Systems  Constitutional: Negative for fever, chills and unexpected weight change.  HENT: Negative for hearing loss, congestion, sore throat, trouble swallowing and voice change.  Eyes: Negative for visual disturbance.  Respiratory: Negative for cough and wheezing.  Cardiovascular: Negative for chest pain, palpitations and leg swelling.  Gastrointestinal: Negative for nausea, vomiting, abdominal pain, diarrhea, constipation, blood in stool, abdominal distention, anal bleeding and rectal pain.  Genitourinary: Positive for scrotal swelling. Negative for hematuria and difficulty urinating.  Musculoskeletal: Negative for arthralgias.  Skin: Negative for rash and wound.  Neurological: Negative for seizures, syncope, weakness and headaches.  Hematological: Negative for adenopathy. Does not bruise/bleed easily.  Psychiatric/Behavioral:  Negative for confusion.    Filed Vitals:  07/03/13 0923  BP: 125/88  Pulse: 88  Temp: 98.6 F (37 C)  Resp: 16    Physical Exam  Physical Exam  WDWN in NAD  HEENT: EOMI, sclera anicteric  Neck: No masses, no thyromegaly  Lungs: CTA bilaterally; normal respiratory effort  CV: Regular rate and rhythm; no murmurs  Abd: +bowel sounds, soft, non-tender, no masses  GU; Bilateral descended testes; no testicular masses; Reducible right inguinal hernia; left inguinal hernia  Ext: Well-perfused; no edema  Skin: Warm, dry; no sign of jaundice  Data Reviewed  none  Assessment  Recurrent right inguinal hernia  Anticoagulation/ CAD/ cardiomyopathy/ pacemaker  Plan  We will first need to obtain cardiac clearance from Dr. Aundra Dubin and Dr. Lovena Le. He will need to hold his Coumadin for 5 days prior to surgery. Recommend open repair of recurrent right inguinal hernia with mesh.The surgical procedure has been discussed with the patient. Potential risks, benefits, alternative treatments, and expected outcomes have been explained. All of the patient's questions at this time have been answered. The likelihood of reaching the patient's treatment goal is good. The patient understand the proposed surgical procedure and wishes to proceed.   Imogene Burn. Georgette Dover, MD, [facility] Surgery  General/ Trauma Surgery  07/03/2013 1:54 PM

## 2013-07-04 ENCOUNTER — Encounter: Payer: Self-pay | Admitting: Physician Assistant

## 2013-07-04 ENCOUNTER — Ambulatory Visit (INDEPENDENT_AMBULATORY_CARE_PROVIDER_SITE_OTHER): Payer: Medicare Other | Admitting: Physician Assistant

## 2013-07-04 VITALS — BP 100/60 | HR 74 | Ht 66.0 in | Wt 157.0 lb

## 2013-07-04 DIAGNOSIS — Z01810 Encounter for preprocedural cardiovascular examination: Secondary | ICD-10-CM

## 2013-07-04 DIAGNOSIS — R0602 Shortness of breath: Secondary | ICD-10-CM | POA: Diagnosis not present

## 2013-07-04 DIAGNOSIS — I4891 Unspecified atrial fibrillation: Secondary | ICD-10-CM | POA: Diagnosis not present

## 2013-07-04 DIAGNOSIS — I509 Heart failure, unspecified: Secondary | ICD-10-CM

## 2013-07-04 DIAGNOSIS — I2510 Atherosclerotic heart disease of native coronary artery without angina pectoris: Secondary | ICD-10-CM

## 2013-07-04 DIAGNOSIS — I428 Other cardiomyopathies: Secondary | ICD-10-CM

## 2013-07-04 DIAGNOSIS — E785 Hyperlipidemia, unspecified: Secondary | ICD-10-CM

## 2013-07-04 DIAGNOSIS — Z950 Presence of cardiac pacemaker: Secondary | ICD-10-CM

## 2013-07-04 DIAGNOSIS — I5022 Chronic systolic (congestive) heart failure: Secondary | ICD-10-CM

## 2013-07-04 NOTE — Progress Notes (Signed)
[name], [place], [place]  [address] Phone: [phone] Fax:  [phone]  Date:  07/04/2013   ID:  [name], DOB 02-16-1930, MRN [id number]  PCP:  Craig Hurter, MD  Cardiologist:  Dr. Loralie [name]   Electrophysiologist:  Dr. Cristopher [name]    History of Present Illness: [name] is a 80 y.o. male with a hx paroxysmal atrial fibrillation (s/p several DCCVs; has failed Tikosyn), nonischemic cardiomyopathy, and complete heart block with St Jude CRT-P. He was admitted in 4/14 with pacemaker pocket infection. His pacemaker was removed and temporary-permanent device was placed. Later, he had the temporary-permanent device removed and a new CRT-P device was placed. He developed dyspnea post-operatively and was found to have a large right-sided pleural effusion. He was admitted and got a chest tube for drainage. He had followup with Dr. Servando [name] and it was decided that he would not need VATS.   He returns for surgical clearance.  He needs re-do R inguinal hernia repair.  He has noted increased dyspnea with exertion over the last 2 mos and decreased exercise tolerance.  He has seen his PCP and was placed on Lasix for possible volume overload.  His CXR demonstrated stable small residual R effusion.  He had PFTs done with 50% DLCO and after discussion with Dr. Cristopher [name], it was decided to stop Amiodarone.  Patient just got this message today.  He notes chest tightness off and on since his effusion was tapped.  He has noted more symptoms over the last 2 mos.  He does note worsening tightness with exertion but no assoc radiation, nausea or diaphoresis.  No significant cough. No orthopnea, PND.  He notes increased LE edema that is stable on low dose Lasix.  No syncope.    Studies:  - LHC (2001):  Non-obstructive CAD.  - Echo (06/2012):  EF 45-50%, trivial MR, mild LAE, mild RAE, no effusion (limited echo for eval of eff).  - Nuclear (08/2002):  No ischemia; submaximal  exercise.   Recent Labs: 09/02/2012: HDL Cholesterol 34.20*; LDL (calc) 77  03/21/2013: TSH 3.94  05/23/2013: ALT 28; Creatinine 1.1; Hemoglobin 12.7*; Potassium 4.0  06/21/2013: Pro B Natriuretic peptide (BNP) 482.0*  31715 IMPRESSION: Small right pleural effusion with right basilar atelectasis or infiltrate. No pulmonary edema.   Wt Readings from Last 3 Encounters:  07/04/13 157 lb (71.215 kg)  07/03/13 156 lb 12.8 oz (71.124 kg)  06/28/13 156 lb (70.761 kg)     Past Medical History: 1. Low back pain  2. Hyperlipidemia  3. GERD 4. BPH  5. Polymyalgia rheumatica  6. Atrial fibrillation: Failed Tikosyn in the past. Paroxysmal, on amiodarone and warfarin. DCCV in 4/13, 7/13, 10/13.  7. Transaminitis: Mild, attributed to amiodarone.  8. Complete heart block s/p St Jude CRT-P device. Patient developed PCM pocket infection in 4/14 and had his first system removed with placement of a temporary permanent PCM. He later had CRT-P device replaced. This was complicated by large right-sided hemorrhagic pleural effusion requiring chest tube.  9. Nonischemic cardiomyopathy: Echo (9/13) with EF 40-45%, diffuse HK, mild MR. Mild CAD only on prior LHC.    Past Medical History  Diagnosis Date  . Coronary artery disease   . Cardiomyopathy primary-nonischemic EF 45%  . Hyperlipidemia   . GERD (gastroesophageal reflux disease)   . Polymyalgia rheumatica   . Sick sinus syndrome   . Atrial fibrillation   . Dysphagia   . Hearing loss, mixed, bilateral   .  Increased prostate specific antigen (PSA) velocity   . Lumbar back pain   . BPH (benign prostatic hypertrophy)   . Breast mass     "on both sides" (06/28/2012)  . CHF (congestive heart failure)   . Elevated liver enzymes   . Esophageal stricture   . Pacemaker   . Arthritis     "some" (06/28/2012)  . Pacemaker infection     06/28/12    Current Outpatient Prescriptions  Medication Sig Dispense Refill  . carvedilol (COREG) 12.5 MG  tablet Take 1 tablet (12.5 mg  total) by mouth three times daily with a meal.      . furosemide (LASIX) 20 MG tablet Take on Monday, Wednesday, and Friday  30 tablet  3  . lisinopril (PRINIVIL,ZESTRIL) 2.5 MG tablet Take 1 tablet (2.5 mg total) by mouth daily.  90 tablet  3  . polycarbophil (FIBERCON) 625 MG tablet Take 2 tablets (1,250 mg total) by mouth 2 (two) times daily.      . pravastatin (PRAVACHOL) 40 MG tablet Take 40 mg by mouth daily.      . tamsulosin (FLOMAX) 0.4 MG CAPS Take 2 capsules by mouth daily.       Marland Kitchen warfarin (COUMADIN) 5 MG tablet Take 1 tablet (5 mg total)  by mouth as directed by   anticoagulation clinic  90 tablet  0   No current facility-administered medications for this visit.    Allergies:   Antihistamines, diphenhydramine-type and Amiodarone   Social History:  The patient  reports that he quit smoking about 30 years ago. His smoking use included Cigarettes. He has a 40 pack-year smoking history. He has never used smokeless tobacco. He reports that he does not drink alcohol or use illicit drugs.   Family History:  The patient's family history includes Aneurysm in his mother; Heart attack (age of onset: 28) in his father; Hypertension in his mother; Other in his brother.   ROS:  Please see the history of present illness.      All other systems reviewed and negative.   PHYSICAL EXAM: VS:  BP 100/60  Pulse 74  Ht 5\' 6"  (1.676 m)  Wt 157 lb (71.215 kg)  BMI 25.35 kg/m2 Well nourished, well developed, in no acute distress HEENT: normal Neck: no JVD Cardiac:  normal S1, S2; RRR; no murmur Lungs:  clear to auscultation bilaterally, no wheezing, rhonchi or rales Abd: soft, nontender, no hepatomegaly Ext: trace to 1+ bilateral ankle edema Skin: warm and dry Neuro:  CNs 2-12 intact, no focal abnormalities noted  EKG:  AV paced, HR 74     ASSESSMENT AND PLAN:  1. 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  He has a hx of non-obs CAD 14 years ago.  He is having some chest tightness with exertion as well.  He needs clearance for hernia surgery.  I will arrange a Lexiscan Myoview for risk stratification.  He will remain off of Amiodarone.  He has f/u with Dr. Cristopher [name] soon. 2. Pre-operative cardiovascular examination:  As noted, proceed with nuclear stress test for risk stratification.  If low risk, he will not require further cardiac work  up.   3. CAD, UNSPECIFIED SITE:  As noted, stress test will be arranged.  He is not on ASA as he is on coumadin.  Continue beta blocker, statin. 4. Atrial fibrillation:  Maintaining NSR.  He just stopped Amiodarone today as noted  above.  Continue coumadin.  He has no hx of stroke.  Once he is ready to have surgery, he will be able to hold coumadin without Lovenox bridging.  I did review his case with Dr. Cristopher [name] today.  No further work up is needed for possible lung toxicity from Amiodarone prior to any planned surgery.  He will not require further cardiac work up if his Myoview is low risk.  5. Nonischemic cardiomyopathy:  Continue beta blocker, ACEI. 6. Chronic systolic CHF (congestive heart failure):  He was recently placed on Lasix due to possible volume excess contributing to his dyspnea. His dyspnea is unchanged.  His LE edema is somewhat improved.  Continue current dose of Lasix for now.  7. HYPERLIPIDEMIA:  Continue statin.  8. Cardiac pacemaker in situ:  F/u with EP as planned.  9. Disposition:  F/u with Dr. Cristopher [name] and Dr. Loralie [name] as planned.    Signed, Richardson Dopp, PA-C  07/04/2013 9:49 AM

## 2013-07-04 NOTE — Patient Instructions (Signed)
Your physician has requested that you have a lexiscan myoview. For further information please visit [email or web address]. Please follow instruction sheet, as given.  MAKE SURE TO KEEP YOUR FOLLOW UP APPTS WITH DR. Lovena Le AND DR. Aundra Dubin COMING UP SOON

## 2013-07-05 NOTE — Progress Notes (Signed)
[name], could we get Mr Arvie in to see me relatively soon please?

## 2013-07-07 ENCOUNTER — Encounter: Payer: Self-pay | Admitting: Internal Medicine

## 2013-07-10 ENCOUNTER — Encounter: Payer: Self-pay | Admitting: Internal Medicine

## 2013-07-13 ENCOUNTER — Encounter: Payer: Self-pay | Admitting: Cardiology

## 2013-07-19 ENCOUNTER — Ambulatory Visit (HOSPITAL_COMMUNITY): Payer: Medicare Other | Attending: Cardiovascular Disease | Admitting: Radiology

## 2013-07-19 VITALS — BP 110/69 | HR 74 | Ht 66.0 in | Wt 154.0 lb

## 2013-07-19 DIAGNOSIS — Z01810 Encounter for preprocedural cardiovascular examination: Secondary | ICD-10-CM

## 2013-07-19 DIAGNOSIS — I2510 Atherosclerotic heart disease of native coronary artery without angina pectoris: Secondary | ICD-10-CM | POA: Diagnosis not present

## 2013-07-19 DIAGNOSIS — R079 Chest pain, unspecified: Secondary | ICD-10-CM

## 2013-07-19 DIAGNOSIS — R0989 Other specified symptoms and signs involving the circulatory and respiratory systems: Secondary | ICD-10-CM | POA: Insufficient documentation

## 2013-07-19 DIAGNOSIS — R0789 Other chest pain: Secondary | ICD-10-CM | POA: Insufficient documentation

## 2013-07-19 DIAGNOSIS — R0609 Other forms of dyspnea: Secondary | ICD-10-CM | POA: Insufficient documentation

## 2013-07-19 MED ORDER — ADENOSINE (DIAGNOSTIC) 3 MG/ML IV SOLN
0.5600 mg/kg | Freq: Once | INTRAVENOUS | Status: AC
  Administered 2013-07-19: 39 mg via INTRAVENOUS

## 2013-07-19 MED ORDER — TECHNETIUM TC 99M SESTAMIBI GENERIC - CARDIOLITE
10.0000 | Freq: Once | INTRAVENOUS | Status: AC | PRN
Start: 2013-07-19 — End: 2013-07-19
  Administered 2013-07-19: 10 via INTRAVENOUS

## 2013-07-19 MED ORDER — TECHNETIUM TC 99M SESTAMIBI GENERIC - CARDIOLITE
30.0000 | Freq: Once | INTRAVENOUS | Status: AC | PRN
  Administered 2013-07-19: 30 via INTRAVENOUS

## 2013-07-19 NOTE — Progress Notes (Signed)
[place] [address] [place], [place] [address] [phone]    Cardiology Nuclear Med Study  [name] is a 80 y.o. male     MRN : [id number]     DOB: [birth year]  Procedure Date: 07/19/2013  Nuclear Med Background Indication for Stress Test:  Evaluation for Ischemia and Surgical Clearance Hernia Surgery ( Dr. Lulu Riding) History:  CAD, Cath (n/o dz.), Afib, PTVP, Echo 2014 EF 45-50%, MPI 2006 EF 42% Cardiac Risk Factors: Family History - CAD, History of Smoking, Hypertension and Lipids  Symptoms:  Chest Tightness and DOE   Nuclear Pre-Procedure Caffeine/Decaff Intake:  None NPO After: 7:00pm   Lungs:  clear O2 Sat: 95% on room air. IV 0.9% NS with Angio Cath:  22g  IV Site: R Hand  IV Started by:  Craig Figures, RN  Chest Size (in):  46 Cup Size: n/a  Height: 5\' 6"  (1.676 m)  Weight:  154 lb (69.854 kg)  BMI:  Body mass index is 24.87 kg/(m^2). Tech Comments:  N/A    Nuclear Med Study 1 or 2 day study: 1 day  Stress Test Type:  Adenosine  Reading MD: N/A  Order Authorizing Provider:  [name], MD  Resting Radionuclide: Technetium 28m Sestamibi  Resting Radionuclide Dose: 11.0 mCi   Stress Radionuclide:  Technetium 26m Sestamibi  Stress Radionuclide Dose: 33.0 mCi           Stress Protocol Rest HR: 74 Stress HR: 75  Rest BP: 110/69 Stress BP: 109/71  Exercise Time (min): n/a METS: n/a           Dose of Adenosine (mg):  39.2 mg Dose of Lexiscan: n/a mg  Dose of Atropine (mg): n/a Dose of Dobutamine: n/a mcg/kg/min (at max HR)  Stress Test Technologist: [name], BS-ES  Nuclear Technologist:  [name], CNMT     Rest Procedure:  Myocardial perfusion imaging was performed at rest 45 minutes following the intravenous administration of Technetium 86m Sestamibi. Rest ECG: There is a paced rhythm  Stress Procedure:  The patient received IV adenosine at 140 mcg/kg/min for 4 minutes.  Technetium 25m Sestamibi was  injected at the 2 minute mark and quantitative spect images were obtained after a 45 minute delay. During the infusion of Adenosine, the patient complained of slight SOB and feeling flushed.  These symptoms resolved in recovery.  Stress ECG: No significant change from baseline ECG  QPS Raw Data Images:  Patient motion noted; appropriate software correction applied. Stress Images:  There is a small area of moderate decreased uptake affecting the apical cap, apical lateral segment, and the mid anterolateral segment. Rest Images:  There is a small area of mild decreased uptake affecting the apical cap, apical lateral segment, and mid anterolateral segment. Subtraction (SDS):  There is very slight ischemia at the apical cap and apical lateral segment. Transient Ischemic Dilatation (Normal <1.22):  0.99 Lung/Heart Ratio (Normal <0.45):  0.36  Quantitative Gated Spect Images QGS EDV:  146 ml QGS ESV:  94 ml  Impression Exercise Capacity:  Adenosine study with no exercise. BP Response:  Normal blood pressure response. Clinical Symptoms:  Slight shortness of breath ECG Impression:  No significant ST segment change suggestive of ischemia. Comparison with Prior Nuclear Study:  There is one page from a study from 2006. It is a black and white hard copy image. I cannot fully compare this page with the current study. There is no report available.  Overall Impression:  There is a small area of scar with mild peri-infarct ischemia affecting the apical cap, apical lateral segment, and the mid anterolateral segment. This is a small area. There is only a small amount of ischemia. The ejection fraction is computed at 36%. I think it might be higher. This is a medium risk scan because there is left ventricular dysfunction along with the other findings. However, this does not appear to represent a significant ischemia risk for the patient at this time.  LV Ejection Fraction: 36%.  LV Wall Motion:  There is  decreased motion of the septum. The patient is paced. There is global hypokinesis. It is possible that overall ejection fraction is higher than the value estimated.  [name]

## 2013-07-20 ENCOUNTER — Encounter: Payer: Self-pay | Admitting: Physician Assistant

## 2013-07-21 ENCOUNTER — Telehealth: Payer: Self-pay | Admitting: Cardiology

## 2013-07-21 NOTE — Telephone Encounter (Signed)
Pt is aware in minor detail of the stress test results. Pt is aware of the appointment for Friday 22 nd at 4:30 pm to go over the results.. Pt verbalized understanding.

## 2013-07-21 NOTE — Telephone Encounter (Signed)
New message     Returning a triage nurse's call

## 2013-07-24 ENCOUNTER — Encounter: Payer: Self-pay | Admitting: *Deleted

## 2013-07-28 ENCOUNTER — Ambulatory Visit (INDEPENDENT_AMBULATORY_CARE_PROVIDER_SITE_OTHER): Payer: Medicare Other

## 2013-07-28 ENCOUNTER — Ambulatory Visit (INDEPENDENT_AMBULATORY_CARE_PROVIDER_SITE_OTHER): Payer: Medicare Other | Admitting: Cardiology

## 2013-07-28 VITALS — BP 116/68 | HR 68 | Resp 20 | Ht 66.0 in | Wt 156.8 lb

## 2013-07-28 DIAGNOSIS — I4891 Unspecified atrial fibrillation: Secondary | ICD-10-CM

## 2013-07-28 DIAGNOSIS — I428 Other cardiomyopathies: Secondary | ICD-10-CM | POA: Diagnosis not present

## 2013-07-28 DIAGNOSIS — I509 Heart failure, unspecified: Secondary | ICD-10-CM

## 2013-07-28 DIAGNOSIS — I2510 Atherosclerotic heart disease of native coronary artery without angina pectoris: Secondary | ICD-10-CM

## 2013-07-28 DIAGNOSIS — E785 Hyperlipidemia, unspecified: Secondary | ICD-10-CM | POA: Diagnosis not present

## 2013-07-28 DIAGNOSIS — J90 Pleural effusion, not elsewhere classified: Secondary | ICD-10-CM | POA: Diagnosis not present

## 2013-07-28 DIAGNOSIS — I5022 Chronic systolic (congestive) heart failure: Secondary | ICD-10-CM

## 2013-07-28 LAB — POCT INR: INR: 1.9

## 2013-07-28 MED ORDER — POTASSIUM CHLORIDE CRYS ER 20 MEQ PO TBCR: 20.0000 meq | EXTENDED_RELEASE_TABLET | Freq: Every day | ORAL | Status: DC

## 2013-07-28 MED ORDER — FUROSEMIDE 40 MG PO TABS: 40.0000 mg | ORAL_TABLET | Freq: Every day | ORAL | Status: DC

## 2013-07-28 NOTE — Patient Instructions (Signed)
Increase lasix (furosmide) to 40 mg daily.   Increase KCL (potassium) to 20 mEq daily.  You have been referred to [facility].  Your physician recommends that you schedule a follow-up appointment in: 1 week with Dr Aundra Dubin.

## 2013-07-29 ENCOUNTER — Encounter: Payer: Self-pay | Admitting: Cardiology

## 2013-07-29 NOTE — Progress Notes (Signed)
Patient ID: [name], male   DOB: 1929/06/14, 80 y.o.   MRN: [id number] PCP: Dr. Leanne [name]  80 yo with history of paroxysmal atrial fibrillation, nonischemic cardiomyopathy, and complete heart block with St Jude CRT-P system presents for cardiology followup.  He was admitted in 4/14 with pacemaker pocket infection.  His pacemaker was removed and temporary-permanent device was placed.  Later, he had the temporary-permanent device removed and a new CRT-P device was placed. He developed dyspnea post-operatively and was found to have a large right-sided pleural effusion.  He was admitted and got a chest tube for drainage. He had followup with Dr. Servando [name] and it was decided that he would not need VATS.  He had been on amiodarone for maintenance of NSR.  This had been more successful than Tikosyn.   When I last saw the patient, he had been doing well with no signficant exertional dyspnea.  He had stopped digoxin due to an elevated digoxin level.  However, around 3/15, he started developing increasing exertional dyspnea.  He is short of breath walking up a hill or incline.  He still walks on the treadmill for exercise for 10-15 minutes and uses the elliptical without much trouble on most days.  No orthopnea or PND.  No chest pain.  He saw Dr [name] and was started on Lasix three times a week due to concern for volume overload.  This does not seem to have helped much.  After that, he had PFTs done which showed normal spirometry but DLCO 50% predicted.  Therefore, amiodarone was stopped due to concern for lung toxicity.  His breathing has not gotten worse but has not improved much.  He saw [name] for a pre-operative evaluation prior to right inguinal hernia repair.  Given the exertional dyspnea, he was set up for adenosine Cardiolite.  This showed EF 36% with a small area of primarily scar in the apex, apical lateral wall, and mid anterolateral wall.  He has not felt palpitations. Weight is down 1 lb compared  to last visit to this office (to see [name]).   CXR in 3/15 showed small right pleural effusion, no pulmonary edema.   Labs (7/13); LDL 65, HDL 38 Labs (3/14): TSH normal Labs (4/14):  ALT 61, AST 62 Labs (5/14): K 4.5, creatinine 0.9, BNP 727, digoxin 0.9 Labs (6/14): LDL 77, HDL 34, K 4, creatinine 0.9, TSH normal Labs (10/14): digoxin 2.1, K 4.7, creatinine 1.0, TSH normal, LFTs normal Labs (3/15): K 4, creatinine 1.1 Labs (4/15): BNP 482  PMH: 1. Low back pain 2. Hyperlipidemia 3. GERD 4. BPH 5. Polymyalgia rheumatica 6. Atrial fibrillation: Failed Tikosyn in the past.  Paroxysmal, on amiodarone and warfarin.  DCCV in 4/13, 7/13, 10/13.  Has held NSR with amiodarone.  However, concern for amiodarone lung toxicity: PFTs (4/15) with FVC 90% predicted, FEV1 90%, ratio 97%, DLCO 50%.   7. Transaminitis: Mild, attributed to amiodarone.  8. Complete heart block s/p St Jude CRT-P device.  Patient developed PCM pocket infection in 4/14 and had his first system removed with placement of a temporary permanent PCM.  He later had CRT-P device replaced.  This was complicated by large right-sided hemorrhagic pleural effusion requiring chest tube.   9. Nonischemic cardiomyopathy: Echo (9/13) with EF 40-45%, diffuse HK, mild Craig.  Mild CAD only on prior LHC.  Echo (4/14) with EF 45-50%.  Adenosine Cardiolite (5/15) with EF 36% (visually appeared higher per report), small area of scar with peri-infarct ischemia in  the apex, apical lateral wall, and mid anterolateral wall.  10. Large right pleural effusion requiring chest tube following removal and reimplantation of PCM.   SH: Married, prior smoker (quit 1985), no ETOH x years, retired, lives at [facility]  [name]: Father with MI at 95, mother with AAA.   ROS: All systems reviewed and negative except as per HPI.   Current Outpatient Prescriptions  Medication Sig Dispense Refill  . lisinopril (PRINIVIL,ZESTRIL) 2.5 MG tablet Take 1 tablet (2.5 mg  total) by mouth daily.  90 tablet  3  . polycarbophil (FIBERCON) 625 MG tablet Take 2 tablets (1,250 mg total) by mouth 2 (two) times daily.      . pravastatin (PRAVACHOL) 40 MG tablet Take 40 mg by mouth daily.      . tamsulosin (FLOMAX) 0.4 MG CAPS Take 2 capsules by mouth daily.       Marland Kitchen warfarin (COUMADIN) 5 MG tablet Take 1 tablet (5 mg total)  by mouth as directed by   anticoagulation clinic  90 tablet  0  . carvedilol (COREG) 6.25 MG tablet Pt takes 1/ 2 of a 12.5mg  tablet two times a day      . furosemide (LASIX) 40 MG tablet Take 1 tablet (40 mg total) by mouth daily.  30 tablet  3  . potassium chloride SA (K-DUR,KLOR-CON) 20 MEQ tablet Take 1 tablet (20 mEq total) by mouth daily.  30 tablet  3   No current facility-administered medications for this visit.    BP 116/68  Pulse 68  Resp 20  Ht 5\' 6"  (1.676 m)  Wt 156 lb 12.8 oz (71.124 kg)  BMI 25.32 kg/m2 General: NAD Neck: JVP 12 cm, no thyromegaly or thyroid nodule.  Lungs: CTAB  CV: Nondisplaced PMI.  Heart regular S1/S2, no S3/S4, no murmur.  2+ edema 1/2 to knees bilaterally.  No carotid bruit.  Normal pedal pulses.  Abdomen: Soft, nontender, no hepatosplenomegaly, no distention.  Neurologic: Alert and oriented x 3.  Psych: Normal affect. Extremities: No clubbing or cyanosis.   Assessment/Plan: 1. Dyspnea with exertion: This is new for [name] over the last 2 months. CXR in 3/15 showed clear lungs except for a small right pleural effusion (chronic). He had PFTs done showing normal spirometry but low DLCO, concerning for amiodarone lung toxicity.  Amiodarone was stopped.  I am not convinced that this is primarily amiodarone lung toxicity.  As above, initial CXR did not show interstitial pneumonitis, and today, he is clearly volume overloaded on exam.  CHF at least plays a role in his presentation.   - I am going to increase his Lasix to 40 mg daily with KCl 20 mEq daily.  He will need to followup in 1 week for BMET/BNP.    - I would like him to be evaluated by pulmonary for question of amiodarone lung toxicity.  I am also going to repeat a CXR to see if there is any evidence for interstitial pneumonitis as one might see with amiodarone lung toxicity and also to see if he has a growing right pleural effusion.  2. Chronic systolic CHF: NYHA class III symptoms x 2 months with volume overload on exam.  He has CRT-P.  Last echo in 4/14 with EF improved to 45-50%.  Presumed nonischemic cardiomyopathy (prior cath with only mild CAD).  However, he had an adenosine Cardiolite recently with EF 36% and possible small area of apical and lateral scar with peri-infarct ischemia.   - As above, starting  Lasix and KCl with BMET/BNP in 1 week. - I would like him to have an echo to confirm lower EF.   - Continue current Coreg and lisinopril.  He has been unable to tolerate a higher lisinopril dose due to fatigue. - I am going to see him back next week.  Will see how he feels after increased diuresis.  I am considering a right and left heart catheterization.  3. Atrial fibrillation: Paroxysmal.  Recently, he has held NSR on amiodarone but stopped amiodarone due to concern for pulmonary toxicity.  He had breakthrough atrial fibrillation on Tikosyn and is not a candidate for Ic agents.  He remains in NSR.  As above, I will have him evaluated by pulmonary for an opinion on whether they think this is amiodarone toxicity.  Continue coumadin for now, will need to hold for 3 days if we plan cardiac cath.  4. Hyperlipidemia: History of nonobstructive CAD.  Good lipids on pravastatin.    Larey Dresser 07/29/2013

## 2013-08-01 ENCOUNTER — Encounter: Payer: Self-pay | Admitting: *Deleted

## 2013-08-01 ENCOUNTER — Other Ambulatory Visit: Payer: Self-pay | Admitting: *Deleted

## 2013-08-01 DIAGNOSIS — I5022 Chronic systolic (congestive) heart failure: Secondary | ICD-10-CM

## 2013-08-01 DIAGNOSIS — I4891 Unspecified atrial fibrillation: Secondary | ICD-10-CM

## 2013-08-01 DIAGNOSIS — J90 Pleural effusion, not elsewhere classified: Secondary | ICD-10-CM

## 2013-08-02 ENCOUNTER — Other Ambulatory Visit (HOSPITAL_COMMUNITY): Payer: Medicare Other

## 2013-08-03 ENCOUNTER — Ambulatory Visit (INDEPENDENT_AMBULATORY_CARE_PROVIDER_SITE_OTHER): Payer: Medicare Other | Admitting: Internal Medicine

## 2013-08-03 ENCOUNTER — Encounter: Payer: Medicare Other | Admitting: Internal Medicine

## 2013-08-03 ENCOUNTER — Encounter: Payer: Self-pay | Admitting: Cardiology

## 2013-08-03 ENCOUNTER — Ambulatory Visit (INDEPENDENT_AMBULATORY_CARE_PROVIDER_SITE_OTHER): Payer: Medicare Other | Admitting: Cardiology

## 2013-08-03 ENCOUNTER — Ambulatory Visit (INDEPENDENT_AMBULATORY_CARE_PROVIDER_SITE_OTHER)
Admission: RE | Admit: 2013-08-03 | Discharge: 2013-08-03 | Disposition: A | Discharge status: Home / Self Care | Payer: Medicare Other | Source: Ambulatory Visit | Attending: Cardiology | Admitting: Cardiology

## 2013-08-03 ENCOUNTER — Encounter: Payer: Self-pay | Admitting: Internal Medicine

## 2013-08-03 VITALS — BP 94/42 | HR 76 | Ht 66.0 in | Wt 149.0 lb

## 2013-08-03 VITALS — BP 90/40 | HR 76 | Wt 149.0 lb

## 2013-08-03 DIAGNOSIS — I509 Heart failure, unspecified: Secondary | ICD-10-CM

## 2013-08-03 DIAGNOSIS — I4891 Unspecified atrial fibrillation: Secondary | ICD-10-CM

## 2013-08-03 DIAGNOSIS — I2510 Atherosclerotic heart disease of native coronary artery without angina pectoris: Secondary | ICD-10-CM | POA: Diagnosis not present

## 2013-08-03 DIAGNOSIS — J90 Pleural effusion, not elsewhere classified: Secondary | ICD-10-CM | POA: Diagnosis not present

## 2013-08-03 DIAGNOSIS — I5022 Chronic systolic (congestive) heart failure: Secondary | ICD-10-CM | POA: Diagnosis not present

## 2013-08-03 LAB — MDC_IDC_ENUM_SESS_TYPE_INCLINIC
Battery Remaining Longevity: 85.2 mo
Brady Statistic RA Percent Paced: 98 %
Brady Statistic RV Percent Paced: 96 %
Lead Channel Impedance Value: 450 Ohm
Lead Channel Impedance Value: 725 Ohm
Lead Channel Pacing Threshold Amplitude: 0.75 V
Lead Channel Pacing Threshold Amplitude: 0.75 V
Lead Channel Pacing Threshold Amplitude: 1 V
Lead Channel Pacing Threshold Pulse Width: 0.4 ms
Lead Channel Pacing Threshold Pulse Width: 0.5 ms
Lead Channel Pacing Threshold Pulse Width: 0.5 ms
Lead Channel Pacing Threshold Pulse Width: 0.5 ms
Lead Channel Sensing Intrinsic Amplitude: 8.5 mV
Lead Channel Setting Pacing Amplitude: 2 V
Lead Channel Setting Pacing Amplitude: 2 V
Lead Channel Setting Pacing Amplitude: 2.5 V
Lead Channel Setting Pacing Pulse Width: 0.4 ms
Lead Channel Setting Pacing Pulse Width: 0.5 ms
MDC IDC MSMT BATTERY VOLTAGE: 2.98 V
MDC IDC MSMT LEADCHNL LV PACING THRESHOLD AMPLITUDE: 0.75 V
MDC IDC MSMT LEADCHNL LV PACING THRESHOLD PULSEWIDTH: 0.5 ms
MDC IDC MSMT LEADCHNL RA IMPEDANCE VALUE: 525 Ohm
MDC IDC MSMT LEADCHNL RA PACING THRESHOLD AMPLITUDE: 0.75 V
MDC IDC MSMT LEADCHNL RA SENSING INTR AMPL: 2.8 mV
MDC IDC MSMT LEADCHNL RV PACING THRESHOLD AMPLITUDE: 1 V
MDC IDC MSMT LEADCHNL RV PACING THRESHOLD PULSEWIDTH: 0.4 ms
MDC IDC PG SERIAL: 2926516
MDC IDC SESS DTM: 20150528145159
MDC IDC SET LEADCHNL RV SENSING SENSITIVITY: 2.5 mV

## 2013-08-03 LAB — BASIC METABOLIC PANEL
BUN: 25 mg/dL — ABNORMAL HIGH (ref 6–23)
CHLORIDE: 102 meq/L (ref 96–112)
CO2: 30 meq/L (ref 19–32)
CREATININE: 1.2 mg/dL (ref 0.4–1.5)
Calcium: 8.9 mg/dL (ref 8.4–10.5)
GFR: 64.67 mL/min (ref >60.00)
Glucose, Bld: 82 mg/dL (ref 70–99)
POTASSIUM: 3.8 meq/L (ref 3.5–5.1)
Sodium: 138 mEq/L (ref 135–145)

## 2013-08-03 NOTE — Assessment & Plan Note (Signed)
His symptoms are currently class 2. He will continue his current meds. He will maintain a low sodium diet.

## 2013-08-03 NOTE — Patient Instructions (Signed)
Your physician wants you to follow-up in: 12 months with Dr Knox Saliva will receive a reminder letter in the mail two months in advance. If you don't receive a letter, please call our office to schedule the follow-up appointment.   Remote monitoring is used to monitor your Pacemaker of ICD from home. This monitoring reduces the number of office visits required to check your device to one time per year. It allows [place] to keep an eye on the functioning of your device to ensure it is working properly. You are scheduled for a device check from home on 11/06/13. You may send your transmission at any time that day. If you have a wireless device, the transmission will be sent automatically. After your physician reviews your transmission, you will receive a postcard with your next transmission date.

## 2013-08-03 NOTE — Assessment & Plan Note (Signed)
He was taken off of amio but is now back on this medication. He will be followed. He is mostly maintaining NSR.

## 2013-08-03 NOTE — Progress Notes (Signed)
HPI [name] returns today for followup. He is a very pleasant 80 year old man with chronic systolic heart failure, symptomatic bradycardia, high-grade conduction system disease, who developed a pacemaker pocket infection and underwent extraction, initial insertion of a temporary permanent pacemaker, followed by insertion of a biventricular pacemaker. His procedure was complicated by atrial lead perforation x2, resulting in a pleural effusion. He has improved and is almost back to normal. He has recently moved to an assisted living situation. He has had some trouble with volume overload and has been place on lasix by his primary cardiologist. He has felt better. He is trying to eat less salt. Allergies  Allergen Reactions  . Antihistamines, Diphenhydramine-Type Other (See Comments)    Causes difficulty in ability to urinate.  . Amiodarone     Amiodarone induced lung toxicity     Current Outpatient Prescriptions  Medication Sig Dispense Refill  . carvedilol (COREG) 6.25 MG tablet Pt takes 1/ 2 of a 12.5mg  tablet two times a day      . furosemide (LASIX) 20 MG tablet Take 20 mg by mouth daily.       Marland Kitchen lisinopril (PRINIVIL,ZESTRIL) 2.5 MG tablet Take 1 tablet (2.5 mg total) by mouth daily.  90 tablet  3  . polycarbophil (FIBERCON) 625 MG tablet Take 2 tablets (1,250 mg total) by mouth 2 (two) times daily.      . potassium chloride SA (K-DUR,KLOR-CON) 20 MEQ tablet Take 1 tablet (20 mEq total) by mouth daily.  30 tablet  3  . pravastatin (PRAVACHOL) 40 MG tablet Take 40 mg by mouth daily.      . tamsulosin (FLOMAX) 0.4 MG CAPS Take 2 capsules by mouth daily.       Marland Kitchen warfarin (COUMADIN) 5 MG tablet Take 1 tablet (5 mg total)  by mouth as directed by   anticoagulation clinic  90 tablet  0   No current facility-administered medications for this visit.     Past Medical History  Diagnosis Date  . Coronary artery disease   . Cardiomyopathy primary-nonischemic EF 45%  . Hyperlipidemia   .  GERD (gastroesophageal reflux disease)   . Polymyalgia rheumatica   . Sick sinus syndrome   . Atrial fibrillation   . Dysphagia   . Hearing loss, mixed, bilateral   . Increased prostate specific antigen (PSA) velocity   . Lumbar back pain   . BPH (benign prostatic hypertrophy)   . Breast mass     "on both sides" (06/28/2012)  . CHF (congestive heart failure)   . Elevated liver enzymes   . Esophageal stricture   . Pacemaker   . Arthritis     "some" (06/28/2012)  . Pacemaker infection     06/28/12  . Hx of cardiovascular stress test     Adenosine Myoview (07/2013):  Apical cap, apical lateral and mid anterolateral scar, small amount of peri-infarct ischemia, EF 36%; Medium Risk    ROS:   All systems reviewed and negative except as noted in the HPI.   Past Surgical History  Procedure Laterality Date  . Cataract extraction w/ intraocular lens  implant, bilateral Bilateral   . Umbilical hernia repair    . Insert / replace / remove pacemaker    . Inguinal hernia repair Right 2012    [facility]  . Umbilical hernia repair    . Cardioversion  06/17/2011    Procedure: CARDIOVERSION;  Surgeon: Lelon Perla, MD;  Location: [facility];  Service: Cardiovascular;  Laterality: N/A;  .  Cardioversion  09/09/2011    Procedure: CARDIOVERSION;  Surgeon: Carlena Bjornstad, MD;  Location: [place];  Service: Cardiovascular;  Laterality: N/A;  . Cardioversion  01/01/2012    Procedure: CARDIOVERSION;  Surgeon: Hillary Bow, MD;  Location: [facility] ENDOSCOPY;  Service: Cardiovascular;  Laterality: N/A;  . Tee without cardioversion  01/01/2012    Procedure: TRANSESOPHAGEAL ECHOCARDIOGRAM (TEE);  Surgeon: Fay Records, MD;  Location: [place];  Service: Cardiovascular;  Laterality: N/A;  . Eye surgery    . Generator removal Left 06/15/2012    Procedure: GENERATOR REMOVAL;  Surgeon: Evans Lance, MD;  Location: [place];  Service: Cardiovascular;  Laterality: Left;  . Pacemaker generator change N/A 06/15/2012     Procedure: PACEMAKER GENERATOR CHANGE;  Surgeon: Evans Lance, MD;  Location: [place];  Service: Cardiovascular;  Laterality: N/A;  . Tonsillectomy and adenoidectomy    . Cardiac catheterization       Family History  Problem Relation Age of Onset  . Heart attack Father 56    deceased  . AAA (abdominal aortic aneurysm) Mother     deceased AAA  . Hypertension Mother   . Other Brother     deceased at birth     History   Social History  . Marital Status: Married    Spouse Name: N/A    Number of Children: 3  . Years of Education: N/A   Occupational History  . retired     Engineering geologist   Social History Main Topics  . Smoking status: Former Smoker -- 1.00 packs/day for 40 years    Types: Cigarettes    Quit date: 03/10/1983  . Smokeless tobacco: Never Used  . Alcohol Use: No  . Drug Use: No  . Sexual Activity: Not on file   Other Topics Concern  . Not on file   Social History Narrative  . No narrative on file     BP 94/42  Pulse 76  Ht 5\' 6"  (1.676 m)  Wt 149 lb (67.586 kg)  BMI 24.06 kg/m2  Physical Exam:  Well appearing  Elderly man, NAD HEENT: Unremarkable Neck:  6 cm JVD, no thyromegally Back:  No CVA tenderness Lungs:  Clear with no wheezes, rales, or rhonchi. Well-healed pacemaker incision. Well-healed extraction incision. HEART:  Regular rate rhythm, no murmurs, no rubs, no clicks Abd:  soft, positive bowel sounds, no organomegally, no rebound, no guarding Ext:  2 plus pulses, trace edema, no cyanosis, no clubbing Skin:  No rashes no nodules Neuro:  CN II through XII intact, motor grossly intact   DEVICE  Normal device function.  See PaceArt for details.   Assess/Plan:

## 2013-08-03 NOTE — Patient Instructions (Signed)
Decrease lasix to 20mg  daily. You can take 1/2 of a 40mg  tablet daily.   Your physician recommends that you have  lab work today--BMET.  Your physician recommends that you schedule a follow-up appointment in: 1 month with Dr Aundra Dubin.

## 2013-08-04 ENCOUNTER — Ambulatory Visit (HOSPITAL_COMMUNITY): Payer: Medicare Other | Attending: Cardiology | Admitting: Radiology

## 2013-08-04 DIAGNOSIS — R0989 Other specified symptoms and signs involving the circulatory and respiratory systems: Principal | ICD-10-CM | POA: Insufficient documentation

## 2013-08-04 DIAGNOSIS — R0609 Other forms of dyspnea: Secondary | ICD-10-CM | POA: Insufficient documentation

## 2013-08-04 DIAGNOSIS — I4891 Unspecified atrial fibrillation: Secondary | ICD-10-CM | POA: Diagnosis not present

## 2013-08-04 DIAGNOSIS — I5022 Chronic systolic (congestive) heart failure: Secondary | ICD-10-CM

## 2013-08-04 DIAGNOSIS — J90 Pleural effusion, not elsewhere classified: Secondary | ICD-10-CM

## 2013-08-04 NOTE — Progress Notes (Signed)
Patient ID: [name], male   DOB: Mar 31, 1929, 80 y.o.   MRN: [id number] PCP: Dr. Leanne [name]  79 yo with history of paroxysmal atrial fibrillation, nonischemic cardiomyopathy, and complete heart block with St Jude CRT-P system presents for cardiology followup.  He was admitted in 4/14 with pacemaker pocket infection.  His pacemaker was removed and temporary-permanent device was placed.  Later, he had the temporary-permanent device removed and a new CRT-P device was placed. He developed dyspnea post-operatively and was found to have a large right-sided pleural effusion.  He was admitted and got a chest tube for drainage. He had followup with Dr. Servando [name] and it was decided that he would not need VATS.  He had been on amiodarone for maintenance of NSR.  This had been more successful than Tikosyn.   Around 3/15, he started developing increasing exertional dyspnea.  He was short of breath walking up a hill or incline.  He could still walk on the treadmill for exercise for 10-15 minutes and use the elliptical without much trouble on most days.  No orthopnea or PND.  No chest pain.  He saw Dr [name] and was started on Lasix three times a week due to concern for volume overload.  This did not seem to have helped much.  After that, he had PFTs done which showed normal spirometry but DLCO 50% predicted.  Therefore, amiodarone was stopped due to concern for lung toxicity.  He saw [name] for a pre-operative evaluation prior to right inguinal hernia repair.  Given the exertional dyspnea, he was set up for adenosine Cardiolite in 5/15.  This showed EF 36% with a small area of primarily scar in the apex, apical lateral wall, and mid anterolateral wall.  He has not felt palpitations.   At last visit, I thought he was volume overloaded.  I increased his Lasix to 40 mg daily.  I had him get a CXR, which did not now significant interstitial markings but which did show a loculated right basal effusion that seemed to  have enlarged some.  Since last appointment, he has lost 7 lbs.  His breathing is much better and is close to where it was a few months ago.  He was at the beach over the weekend and was able to walk for about an hour a day on the beach without dyspnea.  Ankle swelling also has decreased.     Labs (7/13); LDL 65, HDL 38 Labs (3/14): TSH normal Labs (4/14):  ALT 61, AST 62 Labs (5/14): K 4.5, creatinine 0.9, BNP 727, digoxin 0.9 Labs (6/14): LDL 77, HDL 34, K 4, creatinine 0.9, TSH normal Labs (10/14): digoxin 2.1, K 4.7, creatinine 1.0, TSH normal, LFTs normal Labs (3/15): K 4, creatinine 1.1 Labs (4/15): BNP 482  PMH: 1. Low back pain 2. Hyperlipidemia 3. GERD 4. BPH 5. Polymyalgia rheumatica 6. Atrial fibrillation: Failed Tikosyn in the past.  Paroxysmal, on amiodarone and warfarin.  DCCV in 4/13, 7/13, 10/13.  Has held NSR with amiodarone.  However, concern for amiodarone lung toxicity: PFTs (4/15) with FVC 90% predicted, FEV1 90%, ratio 97%, DLCO 50%.   7. Transaminitis: Mild, attributed to amiodarone.  8. Complete heart block s/p St Jude CRT-P device.  Patient developed PCM pocket infection in 4/14 and had his first system removed with placement of a temporary permanent PCM.  He later had CRT-P device replaced.  This was complicated by large right-sided hemorrhagic pleural effusion requiring chest tube.   9. Nonischemic cardiomyopathy: Echo (  9/13) with EF 40-45%, diffuse HK, mild Craig.  Mild CAD only on prior LHC.  Echo (4/14) with EF 45-50%.  Adenosine Cardiolite (5/15) with EF 36% (visually appeared higher per report), small area of scar with peri-infarct ischemia in the apex, apical lateral wall, and mid anterolateral wall.  10. Large right pleural effusion requiring chest tube following removal and reimplantation of PCM.   SH: Married, prior smoker (quit 1985), no ETOH x years, retired, lives at [facility]  [place]: Father with MI at 87, mother with AAA.   ROS: All systems reviewed and  negative except as per HPI.   Current Outpatient Prescriptions  Medication Sig Dispense Refill  . carvedilol (COREG) 6.25 MG tablet Pt takes 1/ 2 of a 12.5mg  tablet two times a day      . lisinopril (PRINIVIL,ZESTRIL) 2.5 MG tablet Take 1 tablet (2.5 mg total) by mouth daily.  90 tablet  3  . polycarbophil (FIBERCON) 625 MG tablet Take 2 tablets (1,250 mg total) by mouth 2 (two) times daily.      . potassium chloride SA (K-DUR,KLOR-CON) 20 MEQ tablet Take 1 tablet (20 mEq total) by mouth daily.  30 tablet  3  . pravastatin (PRAVACHOL) 40 MG tablet Take 40 mg by mouth daily.      . tamsulosin (FLOMAX) 0.4 MG CAPS Take 2 capsules by mouth daily.       Marland Kitchen warfarin (COUMADIN) 5 MG tablet Take 1 tablet (5 mg total)  by mouth as directed by   anticoagulation clinic  90 tablet  0  . furosemide (LASIX) 20 MG tablet Take 20 mg by mouth daily.        No current facility-administered medications for this visit.    BP 90/40  Pulse 76  Wt 149 lb (67.586 kg) General: NAD Neck: JVP 7 cm, no thyromegaly or thyroid nodule.  Lungs: CTAB  CV: Nondisplaced PMI.  Heart regular S1/S2, no S3/S4, no murmur.  1+ edema at ankles bilaterally (improved).  No carotid bruit.  Normal pedal pulses.  Abdomen: Soft, nontender, no hepatosplenomegaly, no distention.  Neurologic: Alert and oriented x 3.  Psych: Normal affect. Extremities: No clubbing or cyanosis.   Assessment/Plan: 1. Dyspnea with exertion: This had been new for [name] over the last 2 months. CXR in 3/15 showed clear lungs except for a small right pleural effusion (chronic), my repeat CXR last week did not show prominent interstitial markings but did show growth in the loculated right pleural effusion. He had PFTs done showing normal spirometry but low DLCO, concerning for amiodarone lung toxicity.  Amiodarone was stopped.  I am not convinced that this is primarily amiodarone lung toxicity, especially as he has improved a lot with Lasix.  I think that  CHF played the major role in his presentation.    - I would like him to be evaluated by pulmonary for question of amiodarone lung toxicity and also for reassessment of his loculated right pleural effusion.  I would like to know if it would be reasonably safe to restart his amiodarone as this has been the only agent that has controlled his symptomatic atrial fibrillation. - Can cut back on Lasix to 20 mg daily.  2. Chronic systolic CHF: NYHA class III symptoms x 2 months with volume overload on exam at last visit.  He has CRT-P.  Last echo in 4/14 with EF improved to 45-50%.  Presumed nonischemic cardiomyopathy (prior cath with only mild CAD).  However, he had an adenosine Cardiolite  recently with EF 36% and possible small area of apical and lateral scar with peri-infarct ischemia.  Since I increased his Lasix, he lost 7 lbs and breathing got a lot better.  He looks euvolemic. - As above, can back off on Lasix now to 20 mg daily.  Needs BMET/BNP today.  - Awaiting echo to confirm lower EF.   - Continue current Coreg and lisinopril.  He has been unable to tolerate a higher lisinopril dose due to fatigue and BP is soft today. - I will see him back in a month.  If echo confirms significant fall in EF, I may have him hold warfarin for a right and left heart catheterization, especially given the finding of possible scar with peri-infarct ischemia on Cardiolite. 3. Atrial fibrillation: Paroxysmal.  Recently, he has held NSR on amiodarone but stopped amiodarone due to concern for pulmonary toxicity.  He had breakthrough atrial fibrillation on Tikosyn and is not a candidate for Ic agents.  He remains in NSR.  As above, I will have him evaluated by pulmonary for an opinion on whether they think this is amiodarone toxicity.  Given the improvement with Lasix, I hope not.  Would like to resume low dose amiodarone.  Continue coumadin for now, will need to hold for 3 days if we plan cardiac cath in future.  4.  Hyperlipidemia: History of nonobstructive CAD.  Good lipids on pravastatin.    Larey Dresser 08/04/2013

## 2013-08-04 NOTE — Progress Notes (Signed)
Echocardiogram performed.  

## 2013-08-14 ENCOUNTER — Telehealth: Payer: Self-pay | Admitting: Internal Medicine

## 2013-08-14 DIAGNOSIS — I5022 Chronic systolic (congestive) heart failure: Secondary | ICD-10-CM

## 2013-08-14 DIAGNOSIS — I4891 Unspecified atrial fibrillation: Secondary | ICD-10-CM

## 2013-08-14 MED ORDER — FUROSEMIDE 20 MG PO TABS: 20.0000 mg | ORAL_TABLET | Freq: Every day | ORAL | Status: DC

## 2013-08-14 NOTE — Telephone Encounter (Signed)
[name] is requesting re-fill on furosemide (LASIX) 20 MG tablet

## 2013-08-14 NOTE — Telephone Encounter (Signed)
rx sent in electronically 

## 2013-08-16 ENCOUNTER — Ambulatory Visit (INDEPENDENT_AMBULATORY_CARE_PROVIDER_SITE_OTHER): Payer: Medicare Other | Admitting: Internal Medicine

## 2013-08-16 ENCOUNTER — Telehealth: Payer: Self-pay | Admitting: Internal Medicine

## 2013-08-16 ENCOUNTER — Encounter: Payer: Self-pay | Admitting: Internal Medicine

## 2013-08-16 ENCOUNTER — Ambulatory Visit (INDEPENDENT_AMBULATORY_CARE_PROVIDER_SITE_OTHER)
Admission: RE | Admit: 2013-08-16 | Discharge: 2013-08-16 | Disposition: A | Discharge status: Home / Self Care | Payer: Medicare Other | Source: Ambulatory Visit | Attending: Internal Medicine | Admitting: Internal Medicine

## 2013-08-16 ENCOUNTER — Other Ambulatory Visit: Payer: Self-pay | Admitting: *Deleted

## 2013-08-16 VITALS — BP 98/60 | HR 71 | Ht 66.0 in | Wt 153.0 lb

## 2013-08-16 DIAGNOSIS — R0609 Other forms of dyspnea: Secondary | ICD-10-CM

## 2013-08-16 DIAGNOSIS — J90 Pleural effusion, not elsewhere classified: Secondary | ICD-10-CM

## 2013-08-16 DIAGNOSIS — R05 Cough: Secondary | ICD-10-CM

## 2013-08-16 DIAGNOSIS — R0989 Other specified symptoms and signs involving the circulatory and respiratory systems: Secondary | ICD-10-CM

## 2013-08-16 DIAGNOSIS — R059 Cough, unspecified: Secondary | ICD-10-CM | POA: Diagnosis not present

## 2013-08-16 DIAGNOSIS — I4891 Unspecified atrial fibrillation: Secondary | ICD-10-CM

## 2013-08-16 DIAGNOSIS — R0600 Dyspnea, unspecified: Secondary | ICD-10-CM

## 2013-08-16 DIAGNOSIS — I2510 Atherosclerotic heart disease of native coronary artery without angina pectoris: Secondary | ICD-10-CM

## 2013-08-16 DIAGNOSIS — R053 Chronic cough: Secondary | ICD-10-CM

## 2013-08-16 MED ORDER — FLUTICASONE PROPIONATE 50 MCG/ACT NA SUSP: 2.0000 | Freq: Every day | NASAL | Status: DC

## 2013-08-16 MED ORDER — POTASSIUM CHLORIDE CRYS ER 20 MEQ PO TBCR: 20.0000 meq | EXTENDED_RELEASE_TABLET | Freq: Every day | ORAL | Status: DC

## 2013-08-16 NOTE — Patient Instructions (Addendum)
#  pleural effusion right  - THis is worse 08/03/13 compared to Nov 2014 but still better than May 2014 when it was bloody - Do CXR 2 view  #shortness of breath   - doubt amiodarone lung toxicity  - sounds more like heart failure related  - will look at cxr today and based on that get CT chest to make a determination on this  #chronic mild cough   - likely due to post nasal drainage  -  take generic fluticasone inhaler 2 squirts each nostril daily - if does not resolve, consider after d/w Dr Aundra Dubin a trial off lisinopril   #Followup  - will call with cxr results and decide

## 2013-08-16 NOTE — Telephone Encounter (Signed)
Let patient know that CXR shows improved pleural effusion from 08/03/13 but still possibly worse than nov 2014. So, best to get a CT chest (I have ordered this) and then I Can call back with results

## 2013-08-16 NOTE — Progress Notes (Signed)
Subjective:    Patient ID: [name], male    DOB: June 08, 1929, 80 y.o.   MRN: [id number] PCP Chancy Hurter, MD  HPI  IOV 08/16/2013  Chief Complaint  Patient presents with  . Pulmonary Consult    Referred by Dr. Aundra Dubin for amiodarone toxicity. Pt states he is no longer SOB d/t diaphoresing from lasix. Pt states he only coughs when lying down. Denies CP.    80 year old extremely functional male with multiple medical problems chief of which is chronic systolic heart failure. Referred by Dr. [name] for evaluation of possible amiodarone lung toxicity and also evaluation of right pleural effusion and mild chronic cough  - Chronic right pleural effusion: In May 2014 he says that after pacer placement (following infection) and repeated interventions he developed a right pleural effusion that was loculated [is confirmed on chart review]. He had a thoracentesis in the first week of May 2014. The effusions were bloody with a high LDH in the thousands. Greater than 1 L was removed. He was s/p chest tube. VATS was deferred. There was no malignant cells.  By his history it is felt that this was hemothorax related to cardiac intervention. Subsequent serial chest x-rays show near-total resolution of this pleural effusion as of November 2014 with continued stability as of 05/23/13 . Follow up in the chest x-ray Aug 03, 2013 this effusion has increased  Possibly and so he wants this evaluaed   - Question of amiodarone lung toxicity: I reviewed Dr. Kirk Ruths McLean's excellent notes from 08/03/13.Marland Kitchen  He is resistant to control atrial fibrillation with tikosyn and appears that on amiodarone looks well the situation. But in march 2015 he had worsening dyspnea. CXR march 2015 only showed small pleural effusion on right. Lasix increased/startd by PCP without response. PFT 06/27/13 showed isolated low dlco 50% and so amiodarone stopped 06/30/13.  Followup he did with Dr. Aundra Dubin 07/29/13 and felt to be  volume overloaded and lasix increased to 40mg  daily. Followup  08/03/2013 showed absence of interstitial lung disease on chest x-ray and noticed with repeat Lasix trial he had significantly improved his dyspnea but cxr . Currently patient feels nearly back to baseline. Dr. Aundra Dubin wants to make sure that it is safe for [place] to restart his amiodarone  - Chronic cough: He is extremely mild chronic cough that is worse at night when he lies down. This is associated with postnasal drainage. He has not taken his nasal inhalers anymore. He is also noted to be on ACE inhibitor for a long time but this is required for his heart failure. There is no associated acid reflux. He denies any diagnosis of pulmonary  diagnosis   - CXR this OV  shows improved pleural effusion from 08/03/13 but still possibly worse than nov 2014   Past Medical History  Diagnosis Date  . Coronary artery disease   . Cardiomyopathy primary-nonischemic EF 45%  . Hyperlipidemia   . GERD (gastroesophageal reflux disease)   . Polymyalgia rheumatica   . Sick sinus syndrome   . Atrial fibrillation   . Dysphagia   . Hearing loss, mixed, bilateral   . Increased prostate specific antigen (PSA) velocity   . Lumbar back pain   . BPH (benign prostatic hypertrophy)   . Breast mass     "on both sides" (06/28/2012)  . CHF (congestive heart failure)   . Elevated liver enzymes   . Esophageal stricture   . Pacemaker   . Arthritis     "  some" (06/28/2012)  . Pacemaker infection     06/28/12  . Hx of cardiovascular stress test     Adenosine Myoview (07/2013):  Apical cap, apical lateral and mid anterolateral scar, small amount of peri-infarct ischemia, EF 36%; Medium Risk     Family History  Problem Relation Age of Onset  . Heart attack Father 42    deceased  . AAA (abdominal aortic aneurysm) Mother     deceased AAA  . Hypertension Mother   . Other Brother     deceased at birth     History   Social History  . Marital Status: Married      Spouse Name: N/A    Number of Children: 3  . Years of Education: N/A   Occupational History  . retired     Engineering geologist   Social History Main Topics  . Smoking status: Former Smoker -- 1.00 packs/day for 40 years    Types: Cigarettes    Quit date: 03/10/1983  . Smokeless tobacco: Never Used  . Alcohol Use: No  . Drug Use: No  . Sexual Activity: Not on file   Other Topics Concern  . Not on file   Social History Narrative  . No narrative on file     Allergies  Allergen Reactions  . Antihistamines, Diphenhydramine-Type Other (See Comments)    Causes difficulty in ability to urinate.  . Amiodarone     Amiodarone induced lung toxicity     Outpatient Prescriptions Prior to Visit  Medication Sig Dispense Refill  . carvedilol (COREG) 6.25 MG tablet Pt takes 1/ 2 of a 12.5mg  tablet two times a day      . furosemide (LASIX) 20 MG tablet Take 1 tablet (20 mg total) by mouth daily.  90 tablet  3  . lisinopril (PRINIVIL,ZESTRIL) 2.5 MG tablet Take 1 tablet (2.5 mg total) by mouth daily.  90 tablet  3  . polycarbophil (FIBERCON) 625 MG tablet Take 2 tablets (1,250 mg total) by mouth 2 (two) times daily.      . potassium chloride SA (K-DUR,KLOR-CON) 20 MEQ tablet Take 1 tablet (20 mEq total) by mouth daily.  90 tablet  0  . pravastatin (PRAVACHOL) 40 MG tablet Take 40 mg by mouth daily.      . tamsulosin (FLOMAX) 0.4 MG CAPS Take 2 capsules by mouth daily.       Marland Kitchen warfarin (COUMADIN) 5 MG tablet Take 1 tablet (5 mg total)  by mouth as directed by   anticoagulation clinic  90 tablet  0   No facility-administered medications prior to visit.       Review of Systems  Constitutional: Negative for fever and unexpected weight change.  HENT: Negative for congestion, dental problem, ear pain, nosebleeds, postnasal drip, rhinorrhea, sinus pressure, sneezing, sore throat and trouble swallowing.   Eyes: Negative for redness and itching.  Respiratory: Positive for cough.  Negative for chest tightness, shortness of breath and wheezing.   Cardiovascular: Negative for palpitations and leg swelling.  Gastrointestinal: Negative for nausea and vomiting.  Genitourinary: Negative for dysuria.  Musculoskeletal: Negative for joint swelling.  Skin: Negative for rash.  Neurological: Negative for headaches.  Hematological: Does not bruise/bleed easily.  Psychiatric/Behavioral: Negative for dysphoric mood. The patient is not nervous/anxious.        Objective:   Physical Exam  Nursing note and vitals reviewed. Constitutional: He is oriented to person, place, and time. He appears well-developed and well-nourished. No distress.  HENT:  Head:  Normocephalic and atraumatic.  Right Ear: External ear normal.  Left Ear: External ear normal.  Mouth/Throat: Oropharynx is clear and moist. No oropharyngeal exudate.  Post nasal drip +  Eyes: Conjunctivae and EOM are normal. Pupils are equal, round, and reactive to light. Right eye exhibits no discharge. Left eye exhibits no discharge. No scleral icterus.  Neck: Normal range of motion. Neck supple. No JVD present. No tracheal deviation present. No thyromegaly present.  Cardiovascular: Normal rate, regular rhythm and intact distal pulses.  Exam reveals no gallop and no friction rub.   No murmur heard. Pulmonary/Chest: Effort normal and breath sounds normal. No respiratory distress. He has no wheezes. He has no rales. He exhibits no tenderness.  Abdominal: Soft. Bowel sounds are normal. He exhibits no distension and no mass. There is no tenderness. There is no rebound and no guarding.  Musculoskeletal: Normal range of motion. He exhibits no edema and no tenderness.  Lymphadenopathy:    He has no cervical adenopathy.  Neurological: He is alert and oriented to person, place, and time. He has normal reflexes. No cranial nerve deficit. Coordination normal.  Skin: Skin is warm and dry. No rash noted. He is not diaphoretic. No erythema.  No pallor.  Psychiatric: He has a normal mood and affect. His behavior is normal. Judgment and thought content normal.    Filed Vitals:   08/16/13 1444  BP: 98/60  Pulse: 71  Height: 5\' 6"  (1.676 m)  Weight: 153 lb (69.4 kg)  SpO2: 95%         Assessment & Plan:   #pleural effusion right  - get ct chest and reassess; doubt VATS candidate  #shortness of breath   - doubt amiodarone lung toxicity  - sounds more like heart failure related  - will  get CT chest to make a determination on this  #chronic mild cough   - likely due to post nasal drainage  -  take generic fluticasone inhaler 2 squirts each nostril daily - if does not resolve, consider after d/w Dr Aundra Dubin a trial off lisinopril   #Followup  - will  Call with CT results and decide

## 2013-08-17 NOTE — Telephone Encounter (Signed)
lmtcb

## 2013-08-17 NOTE — Telephone Encounter (Signed)
LMTCB x1 w/ spouse

## 2013-08-17 NOTE — Telephone Encounter (Signed)
Pt returned Mindy's call - advised of cxr results as stated by MR below.  Pt verbalized his understanding and denied any questions/concerns.  He is aware he will be contacted regarding the upcoming CT Chest.  Will sign off.

## 2013-08-18 ENCOUNTER — Ambulatory Visit: Payer: Medicare Other | Admitting: Cardiology

## 2013-08-18 DIAGNOSIS — R053 Chronic cough: Secondary | ICD-10-CM | POA: Insufficient documentation

## 2013-08-18 DIAGNOSIS — R05 Cough: Secondary | ICD-10-CM | POA: Insufficient documentation

## 2013-08-18 NOTE — Assessment & Plan Note (Signed)
#  shortness of breath   - doubt amiodarone lung toxicity  - sounds more like heart failure related  - - glad you are better - will get CT chest to ensure no ILD or amio toxicity  #

## 2013-08-18 NOTE — Assessment & Plan Note (Signed)
CXR shows this is worse compared to last year though improved with diuresis from 5/28.15. Suspect this is same old loculated hemothorax with superimposed Acute on chronic  Systolic CHF dependent effusion. Suspect only Rx measure would be lasix. He is not a VATS candidate (good functional quality of life despite effusion and high risk VATS candidate). Will get CT to better delineate findings

## 2013-08-18 NOTE — Assessment & Plan Note (Signed)
chronic mild cough   - likely due to post nasal drainage  -  take generic fluticasone inhaler 2 squirts each nostril daily - if does not resolve, consider after d/w Dr Aundra Dubin a trial off lisinopril   #Followup  - will call with cxr results and decide

## 2013-08-25 ENCOUNTER — Ambulatory Visit (INDEPENDENT_AMBULATORY_CARE_PROVIDER_SITE_OTHER): Payer: Medicare Other | Admitting: *Deleted

## 2013-08-25 ENCOUNTER — Telehealth: Payer: Self-pay | Admitting: Internal Medicine

## 2013-08-25 DIAGNOSIS — I4891 Unspecified atrial fibrillation: Secondary | ICD-10-CM

## 2013-08-25 LAB — POCT INR: INR: 1.6

## 2013-08-25 NOTE — Telephone Encounter (Signed)
Ct chest @[facility]  08/29/13@9 :30am pt aware [name]

## 2013-08-25 NOTE — Telephone Encounter (Signed)
CT was ordered 08/16/13. Please advise pcc's thanks

## 2013-08-29 ENCOUNTER — Telehealth: Payer: Self-pay | Admitting: Internal Medicine

## 2013-08-29 ENCOUNTER — Ambulatory Visit (INDEPENDENT_AMBULATORY_CARE_PROVIDER_SITE_OTHER)
Admission: RE | Admit: 2013-08-29 | Discharge: 2013-08-29 | Disposition: A | Discharge status: Home / Self Care | Payer: Medicare Other | Source: Ambulatory Visit | Attending: Internal Medicine | Admitting: Internal Medicine

## 2013-08-29 DIAGNOSIS — J90 Pleural effusion, not elsewhere classified: Secondary | ICD-10-CM | POA: Diagnosis not present

## 2013-08-29 NOTE — Telephone Encounter (Signed)
Triage (cc to Dr [name])   - plese tell patient that ct scan shows hardly any effusion. Just some trapped fluid between lung lobes that will not bother him and unclear if it will ever go away   - also, no evidence of amio lung toxicity    - patient need not come back for fu unless he wants to for his cough. If cough persists, he should talk to Dr Aundra Dubin about coming off ACE inhibitor   Thanks Dr. Brand Males, M.D., [facility].C.P Pulmonary and Critical Care Medicine Staff Physician [facility] Pager: [phone], If no answer or between  15:00h - 7:00h: call [phone]  08/29/2013 8:15 PM     Ct Chest Wo Contrast  08/29/2013   CLINICAL DATA:  Followup loculated pleural effusion  EXAM: CT CHEST WITHOUT CONTRAST  TECHNIQUE: Multidetector CT imaging of the chest was performed following the standard protocol without IV contrast.  COMPARISON:  07/12/2012  FINDINGS: Loculated fluid is identified along the major fissure between the superior segment of right lower lobe and right upper lobe. This appears similar to previous exam measuring 5.3 x 2.6 cm, image 51/ series 603. Previously 4.1 x 1.8 cm. Basilar component of loculated right pleural effusion has significantly diminished when compared with previous CT from 07/12/2012. Only pleural thickening remains overlying the posterior right lower lobe. No left pleural effusion identified. Scarring within the anterior right middle lobe and right upper lobe identified. There is no suspicious pulmonary nodule or mass identified.  The heart size appears normal. There is no pericardial effusion. Calcified atherosclerotic disease involves the thoracic aorta, the LAD, left circumflex and RCA coronary arteries. The patient has a left chest wall pacer device with leads in the coronary sinus, right atrial appendage and right ventricle. There is no pericardial effusion. No enlarged mediastinal or hilar lymph  nodes. No axillary or supraclavicular adenopathy noted.  Incidental imaging through the upper abdomen shows no acute findings. Mild multi level degenerative disc disease noted within the thoracic spine.  IMPRESSION: 1. When compared with the most recent previous chest CT of 07/12/2012 there has been significant interval decrease in volume of large loculated right pleural effusion. There is a residual pleural thickening overlying the posterior aspect of the right lower lobe. 2. Persistent loculated fluid along the major fissure between the superior segment of the right lower lobe and right upper lobe is noted. 3. Atherosclerotic disease including multi vessel coronary artery calcifications.   Electronically Signed   By: Kerby Moors M.D.   On: 08/29/2013 12:18

## 2013-08-30 NOTE — Telephone Encounter (Signed)
LMTCB

## 2013-08-30 NOTE — Telephone Encounter (Signed)
Results have been explained to patient, pt expressed understanding. Nothing further needed.  

## 2013-09-03 ENCOUNTER — Encounter: Payer: Self-pay | Admitting: Internal Medicine

## 2013-09-05 ENCOUNTER — Ambulatory Visit (INDEPENDENT_AMBULATORY_CARE_PROVIDER_SITE_OTHER): Payer: Medicare Other | Admitting: Cardiology

## 2013-09-05 ENCOUNTER — Encounter: Payer: Self-pay | Admitting: Cardiology

## 2013-09-05 ENCOUNTER — Ambulatory Visit (INDEPENDENT_AMBULATORY_CARE_PROVIDER_SITE_OTHER): Payer: Medicare Other | Admitting: *Deleted

## 2013-09-05 VITALS — BP 106/65 | HR 72 | Ht 66.0 in | Wt 154.0 lb

## 2013-09-05 DIAGNOSIS — I4891 Unspecified atrial fibrillation: Secondary | ICD-10-CM

## 2013-09-05 DIAGNOSIS — I428 Other cardiomyopathies: Secondary | ICD-10-CM

## 2013-09-05 DIAGNOSIS — E785 Hyperlipidemia, unspecified: Secondary | ICD-10-CM | POA: Diagnosis not present

## 2013-09-05 DIAGNOSIS — I2510 Atherosclerotic heart disease of native coronary artery without angina pectoris: Secondary | ICD-10-CM | POA: Diagnosis not present

## 2013-09-05 DIAGNOSIS — I480 Paroxysmal atrial fibrillation: Secondary | ICD-10-CM

## 2013-09-05 DIAGNOSIS — I509 Heart failure, unspecified: Secondary | ICD-10-CM

## 2013-09-05 DIAGNOSIS — I5022 Chronic systolic (congestive) heart failure: Secondary | ICD-10-CM | POA: Diagnosis not present

## 2013-09-05 LAB — BASIC METABOLIC PANEL
BUN: 19 mg/dL (ref 6–23)
CALCIUM: 9.2 mg/dL (ref 8.4–10.5)
CHLORIDE: 101 meq/L (ref 96–112)
CO2: 31 meq/L (ref 19–32)
Creatinine, Ser: 0.9 mg/dL (ref 0.4–1.5)
GFR: 83.65 mL/min (ref >60.00)
GLUCOSE: 88 mg/dL (ref 70–99)
Potassium: 4.3 mEq/L (ref 3.5–5.1)
Sodium: 136 mEq/L (ref 135–145)

## 2013-09-05 LAB — POCT INR: INR: 1.7

## 2013-09-05 MED ORDER — AMIODARONE HCL 200 MG PO TABS: ORAL_TABLET | ORAL | Status: DC

## 2013-09-05 NOTE — Progress Notes (Signed)
Patient ID: [name], male   DOB: Oct 10, 1929, 80 y.o.   MRN: [id number] PCP: Dr. Leanne Chang  80 yo with history of paroxysmal atrial fibrillation, nonischemic cardiomyopathy, and complete heart block with St Jude CRT-P system presents for cardiology followup.  He was admitted in 4/14 with pacemaker pocket infection.  His pacemaker was removed and temporary-permanent device was placed.  Later, he had the temporary-permanent device removed and a new CRT-P device was placed. He developed dyspnea post-operatively and was found to have a large right-sided pleural effusion.  He was admitted and got a chest tube for drainage. He had followup with Dr. Servando Snare and it was decided that he would not need VATS.  He had been on amiodarone for maintenance of NSR.  This had been more successful than Tikosyn.   Around 3/15, he started developing increasing exertional dyspnea.  He was short of breath walking up a hill or incline.  He could still walk on the treadmill for exercise for 10-15 minutes and use the elliptical without much trouble on most days.  No orthopnea or PND.  No chest pain.  He saw Dr Leanne Chang and was started on Lasix three times a week due to concern for volume overload.  This did not seem to have helped much.  After that, he had PFTs done which showed normal spirometry but DLCO 50% predicted.  Therefore, amiodarone was stopped due to concern for lung toxicity.  He saw Richardson Dopp for a pre-operative evaluation prior to right inguinal hernia repair.  Given the exertional dyspnea, he was set up for adenosine Cardiolite in 5/15.  This showed EF 36% with a small area of primarily scar in the apex, apical lateral wall, and mid anterolateral wall.  He has not felt palpitations. Echo in 5/15 showed EF 40-45%, similar to prior study.   At a prior visit, I thought he was volume overloaded.  I increased his Lasix to 40 mg daily, later cut back to 20 mg daily.  I had him see pulmonary for evaluation for amiodarone  lung toxicity.  CT chest looked ok, and he was not thought to have amiodarone lung toxicity. Currently, he continues to do much better than a few months ago.  He is exercising with no problems.  Can walk up a hill without significant dyspnea.  No chest pain. Weight is increased by about 5 lbs since last appointment.   ECG: A-V sequential pacing  Labs (7/13); LDL 65, HDL 38 Labs (3/14): TSH normal Labs (4/14):  ALT 61, AST 62 Labs (5/14): K 4.5, creatinine 0.9, BNP 727, digoxin 0.9 Labs (6/14): LDL 77, HDL 34, K 4, creatinine 0.9, TSH normal Labs (10/14): digoxin 2.1, K 4.7, creatinine 1.0, TSH normal, LFTs normal Labs (3/15): K 4, creatinine 1.1 Labs (4/15): BNP 482 Labs (5/15): K 3.8, creatinine 1.2  PMH: 1. Low back pain 2. Hyperlipidemia 3. GERD 4. BPH 5. Polymyalgia rheumatica 6. Atrial fibrillation: Failed Tikosyn in the past.  Paroxysmal, on amiodarone and warfarin.  DCCV in 4/13, 7/13, 10/13.  Has held NSR with amiodarone.  However, concern for amiodarone lung toxicity: PFTs (4/15) with FVC 90% predicted, FEV1 90%, ratio 97%, DLCO 50%.   7. Transaminitis: Mild, attributed to amiodarone.  8. Complete heart block s/p St Jude CRT-P device.  Patient developed PCM pocket infection in 4/14 and had his first system removed with placement of a temporary permanent PCM.  He later had CRT-P device replaced.  This was complicated by large right-sided hemorrhagic pleural  effusion requiring chest tube.   9. Nonischemic cardiomyopathy: Echo (9/13) with EF 40-45%, diffuse HK, mild MR.  Mild CAD only on prior LHC.  Echo (4/14) with EF 45-50%.  Adenosine Cardiolite (5/15) with EF 36% (visually appeared higher per report), small area of scar with peri-infarct ischemia in the apex, apical lateral wall, and mid anterolateral wall. Echo (5/15) with EF 40-45%, basal to mid inferolateral severe hypokinesis, basal to mid anterolateral hypokinesis, mildly dilated RV with mildly decreased RV systolic function,  mild to moderate MR.   10. Large right pleural effusion requiring chest tube following removal and reimplantation of PCM. Most recent chest CT in 6/15 showed significant decrease in size of loculated pleural effusion since 5/14.    SH: Married, prior smoker (quit 1985), no ETOH x years, retired, lives at [employer]  [place]: Father with MI at 67, mother with AAA.   ROS: All systems reviewed and negative except as per HPI.   Current Outpatient Prescriptions  Medication Sig Dispense Refill  . carvedilol (COREG) 6.25 MG tablet Pt takes 1/ 2 of a 12.5mg  tablet two times a day      . fluticasone (FLONASE) 50 MCG/ACT nasal spray Place 2 sprays into both nostrils daily.  16 g  2  . furosemide (LASIX) 20 MG tablet Take 1 tablet (20 mg total) by mouth daily.  90 tablet  3  . lisinopril (PRINIVIL,ZESTRIL) 2.5 MG tablet Take 1 tablet (2.5 mg total) by mouth daily.  90 tablet  3  . polycarbophil (FIBERCON) 625 MG tablet Take 2 tablets (1,250 mg total) by mouth 2 (two) times daily.      . potassium chloride SA (K-DUR,KLOR-CON) 20 MEQ tablet Take 1 tablet (20 mEq total) by mouth daily.  90 tablet  0  . pravastatin (PRAVACHOL) 40 MG tablet Take 40 mg by mouth daily.      . tamsulosin (FLOMAX) 0.4 MG CAPS Take 2 capsules by mouth daily.       Marland Kitchen warfarin (COUMADIN) 5 MG tablet Take 1 tablet (5 mg total)  by mouth as directed by   anticoagulation clinic  90 tablet  0  . amiodarone (PACERONE) 200 MG tablet 1 tablet daily for one month, then decrease to 1/2 (100mg ) tablet daily  90 tablet  3   No current facility-administered medications for this visit.    BP 106/65  Pulse 72  Ht 5\' 6"  (1.676 m)  Wt 154 lb (69.854 kg)  BMI 24.87 kg/m2 General: NAD Neck: JVP 7 cm, no thyromegaly or thyroid nodule.  Lungs: CTAB  CV: Nondisplaced PMI.  Heart regular S1/S2, no S3/S4, no murmur.  Trace ankle edema bilaterally (improved).  No carotid bruit.  Normal pedal pulses.  Abdomen: Soft, nontender, no hepatosplenomegaly,  no distention.  Neurologic: Alert and oriented x 3.  Psych: Normal affect. Extremities: No clubbing or cyanosis.   Assessment/Plan: 1. Dyspnea with exertion: I suspect that this is primarily due to CHF.  Patient was seen by pulmonary (Dr Chase Caller) and had a chest CT.  He thinks that amiodarone lung toxicity is unlikely.     - Continue current Lasix 20 mg daily.  2. Chronic systolic CHF: NYHA class III symptoms with volume overload now much improved with increased Lasix.  He has CRT-P.  Last echo in 5/15 with EF 40-45% and wall motion abnormalities, similar to prior echoes.  Presumed nonischemic cardiomyopathy (prior cath with only mild CAD).  He had an adenosine Cardiolite recently with EF 36% and possible small area  of apical and lateral scar with peri-infarct ischemia.  He is not significantly volume overloaded on exam. - Continue Lasix 20 mg daily with BMET today.  - Continue current Coreg and lisinopril.  He has been unable to tolerate a higher lisinopril dose due to fatigue and BP is soft today. - Given lack of chest pain, improved symptoms, and similar EF by echo compared to prior studies, I would hold off for now on cardiac cath.  3. Atrial fibrillation: Paroxysmal.  Recently, he has held NSR on amiodarone but stopped amiodarone due to concern for pulmonary toxicity.  He tends to be very symptomatic in atrial fibrillation.  He had breakthrough atrial fibrillation on Tikosyn and is not a candidate for Ic agents.  He remains in NSR.  He was seen by pulmonary and amiodarone lung toxicity is thought to be unlikely.  I think that he can restart amiodarone for NSR maintenance.  - Start back on amiodarone 200 mg daily.  Will decrease to 100 mg daily after 1 month (will try to maintain on lower dose).   - LFTs/TSH in 1 month, will get yearly eye exam.  4. Hyperlipidemia: History of nonobstructive CAD.  Good lipids on pravastatin.    [name] 09/05/2013

## 2013-09-05 NOTE — Patient Instructions (Signed)
Start amiodarone 200mg  daily for 1 month, then decrease to 1/2 tablet (100mg ) daily.   Your physician recommends that you have  lab work today--BMET.  Your physician recommends that you return for lab work in: 1 month--liver profile/TSH.  Your physician recommends that you schedule a follow-up appointment in: 3 months with Dr Aundra Dubin.

## 2013-09-07 ENCOUNTER — Ambulatory Visit: Payer: Medicare Other | Admitting: Cardiology

## 2013-09-12 ENCOUNTER — Encounter: Payer: Self-pay | Admitting: Internal Medicine

## 2013-09-18 ENCOUNTER — Ambulatory Visit (INDEPENDENT_AMBULATORY_CARE_PROVIDER_SITE_OTHER): Payer: Medicare Other

## 2013-09-18 DIAGNOSIS — I4891 Unspecified atrial fibrillation: Secondary | ICD-10-CM

## 2013-09-18 LAB — POCT INR: INR: 2

## 2013-09-20 ENCOUNTER — Telehealth: Payer: Self-pay | Admitting: Internal Medicine

## 2013-09-20 DIAGNOSIS — R159 Full incontinence of feces: Secondary | ICD-10-CM

## 2013-09-20 NOTE — Telephone Encounter (Signed)
Pt has uncontrolled bowel movements.  He will mess his pants.  He doesn't feel bad and he wears a pad.  Fiberlax is not working, stools are not loose.  He wants to know if he should see a GI doctor or what is the next step.  Please advise

## 2013-09-20 NOTE — Telephone Encounter (Signed)
Pt is requesting to speak with you regarding, getting some advice on a medication.

## 2013-09-21 NOTE — Telephone Encounter (Signed)
Pt aware and referral placed.  

## 2013-09-21 NOTE — Telephone Encounter (Signed)
Left message on cell and home number for pt to call back

## 2013-09-21 NOTE — Telephone Encounter (Signed)
Have him see GI-- he needs further evaluation

## 2013-09-22 ENCOUNTER — Telehealth: Payer: Self-pay | Admitting: Internal Medicine

## 2013-09-22 ENCOUNTER — Encounter: Payer: Self-pay | Admitting: Gastroenterology

## 2013-09-22 NOTE — Telephone Encounter (Signed)
Pt has an appt with the GI doctor on 12/05/13, states that is the earliest they could see him, wants to know if dr. Leanne Chang can call to get the appt sooner

## 2013-09-25 NOTE — Telephone Encounter (Signed)
Patient is scheduled for 10-18-2013@8 :30 am [name]  This is the soonest

## 2013-09-25 NOTE — Telephone Encounter (Signed)
[name] could you see if this is possible?

## 2013-09-28 ENCOUNTER — Other Ambulatory Visit: Payer: Medicare Other

## 2013-09-28 ENCOUNTER — Other Ambulatory Visit (INDEPENDENT_AMBULATORY_CARE_PROVIDER_SITE_OTHER): Payer: Medicare Other

## 2013-09-28 DIAGNOSIS — I4891 Unspecified atrial fibrillation: Secondary | ICD-10-CM

## 2013-09-28 DIAGNOSIS — I480 Paroxysmal atrial fibrillation: Secondary | ICD-10-CM

## 2013-09-28 LAB — TSH: TSH: 4.43 u[IU]/mL (ref 0.35–4.50)

## 2013-09-28 LAB — HEPATIC FUNCTION PANEL
ALK PHOS: 53 U/L (ref 39–117)
ALT: 26 U/L (ref 0–53)
AST: 25 U/L (ref 0–37)
Albumin: 3.7 g/dL (ref 3.5–5.2)
Bilirubin, Direct: 0.2 mg/dL (ref 0.0–0.3)
TOTAL PROTEIN: 6.6 g/dL (ref 6.0–8.3)
Total Bilirubin: 1.1 mg/dL (ref 0.2–1.2)

## 2013-10-10 DIAGNOSIS — S99919A Unspecified injury of unspecified ankle, initial encounter: Secondary | ICD-10-CM | POA: Diagnosis not present

## 2013-10-10 DIAGNOSIS — M25569 Pain in unspecified knee: Secondary | ICD-10-CM | POA: Diagnosis not present

## 2013-10-10 DIAGNOSIS — M25469 Effusion, unspecified knee: Secondary | ICD-10-CM | POA: Diagnosis not present

## 2013-10-10 DIAGNOSIS — S8990XA Unspecified injury of unspecified lower leg, initial encounter: Secondary | ICD-10-CM | POA: Diagnosis not present

## 2013-10-13 ENCOUNTER — Encounter: Payer: Self-pay | Admitting: *Deleted

## 2013-10-16 ENCOUNTER — Ambulatory Visit (INDEPENDENT_AMBULATORY_CARE_PROVIDER_SITE_OTHER): Payer: Medicare Other | Admitting: Pharmacist

## 2013-10-16 DIAGNOSIS — I4891 Unspecified atrial fibrillation: Secondary | ICD-10-CM

## 2013-10-16 DIAGNOSIS — M25569 Pain in unspecified knee: Secondary | ICD-10-CM | POA: Diagnosis not present

## 2013-10-16 LAB — POCT INR: INR: 3.1

## 2013-10-18 ENCOUNTER — Encounter: Payer: Self-pay | Admitting: Gastroenterology

## 2013-10-18 ENCOUNTER — Ambulatory Visit (INDEPENDENT_AMBULATORY_CARE_PROVIDER_SITE_OTHER): Payer: Medicare Other | Admitting: Gastroenterology

## 2013-10-18 VITALS — BP 106/72 | HR 70 | Ht 66.0 in | Wt 160.2 lb

## 2013-10-18 DIAGNOSIS — R159 Full incontinence of feces: Secondary | ICD-10-CM | POA: Diagnosis not present

## 2013-10-18 DIAGNOSIS — I2510 Atherosclerotic heart disease of native coronary artery without angina pectoris: Secondary | ICD-10-CM | POA: Diagnosis not present

## 2013-10-18 NOTE — Progress Notes (Signed)
10/18/2013 [name] [id number] January 02, 1930   HISTORY OF PRESENT ILLNESS:  This is a pleasant 80 year old male who is previously known to Dr. Carlean Purl.  The most recent colonoscopy that we can find is from 01/2005 at which time he was found to have only diverticulosis.  He thinks that he had a colonoscopy more recently within the past couple of years, however, we cannot find record of that.  Anyway, he is here today with complaints of fecal incontinence/leakage.  Began about 6 months ago.  He had changed his diet and was eating a lot more salads/roughage.  Having leakage of stool between BM's.  He tried taking several fiber tablets daily at the recommendation of his PCP but it did not help.  He is still taking some fiber tablets daily, but has decreased the dose.  He has also decreased the amount of salads he has been eating and that has seemed to help a lot over the past 2 weeks or so.  Still having some leakage and having to wear a liner in his underwear, but it has improved greatly.  He has formed BM's, but sometimes does not move his bowels for a couple of days.  Does not feel constipated.  Does not see blood with stools.      Past Medical History  Diagnosis Date  . Coronary artery disease   . Cardiomyopathy primary-nonischemic EF 45%  . Hyperlipidemia   . GERD (gastroesophageal reflux disease)   . Polymyalgia rheumatica   . Sick sinus syndrome   . Atrial fibrillation   . Dysphagia   . Hearing loss, mixed, bilateral   . Increased prostate specific antigen (PSA) velocity   . Lumbar back pain   . BPH (benign prostatic hypertrophy)   . Breast mass     "on both sides" (06/28/2012)  . CHF (congestive heart failure)   . Elevated liver enzymes   . Esophageal stricture   . Pacemaker   . Arthritis     "some" (06/28/2012)  . Pacemaker infection     06/28/12  . Hx of cardiovascular stress test     Adenosine Myoview (07/2013):  Apical cap, apical lateral and mid anterolateral scar,  small amount of peri-infarct ischemia, EF 36%; Medium Risk   Past Surgical History  Procedure Laterality Date  . Cataract extraction w/ intraocular lens  implant, bilateral Bilateral   . Umbilical hernia repair    . Insert / replace / remove pacemaker    . Inguinal hernia repair Right 2012    RIH  . Umbilical hernia repair    . Cardioversion  06/17/2011    Procedure: CARDIOVERSION;  Surgeon: Lelon Perla, MD;  Location: [place];  Service: Cardiovascular;  Laterality: N/A;  . Cardioversion  09/09/2011    Procedure: CARDIOVERSION;  Surgeon: Carlena Bjornstad, MD;  Location: [place];  Service: Cardiovascular;  Laterality: N/A;  . Cardioversion  01/01/2012    Procedure: CARDIOVERSION;  Surgeon: Hillary Bow, MD;  Location: [facility] ENDOSCOPY;  Service: Cardiovascular;  Laterality: N/A;  . Tee without cardioversion  01/01/2012    Procedure: TRANSESOPHAGEAL ECHOCARDIOGRAM (TEE);  Surgeon: Fay Records, MD;  Location: [place];  Service: Cardiovascular;  Laterality: N/A;  . Eye surgery    . Generator removal Left 06/15/2012    Procedure: GENERATOR REMOVAL;  Surgeon: Evans Lance, MD;  Location: [place];  Service: Cardiovascular;  Laterality: Left;  . Pacemaker generator change N/A 06/15/2012    Procedure: PACEMAKER  GENERATOR CHANGE;  Surgeon: Evans Lance, MD;  Location: [place];  Service: Cardiovascular;  Laterality: N/A;  . Tonsillectomy and adenoidectomy    . Cardiac catheterization    . Esophagogastroduodenoscopy    . Colonoscopy      reports that he quit smoking about 30 years ago. His smoking use included Cigarettes. He has a 40 pack-year smoking history. He has never used smokeless tobacco. He reports that he does not drink alcohol or use illicit drugs. family history includes AAA (abdominal aortic aneurysm) in his mother; Heart attack (age of onset: 66) in his father; Hypertension in his mother; Other in his brother. Allergies  Allergen Reactions  . Antihistamines, Diphenhydramine-Type Other  (See Comments)    Causes difficulty in ability to urinate.  . Amiodarone     Amiodarone induced lung toxicity      Outpatient Encounter Prescriptions as of 10/18/2013  Medication Sig  . amiodarone (PACERONE) 200 MG tablet 1 tablet daily for one month, then decrease to 1/2 (100mg ) tablet daily  . carvedilol (COREG) 6.25 MG tablet Pt takes 1/ 2 of a 12.5mg  tablet two times a day  . fluticasone (FLONASE) 50 MCG/ACT nasal spray Place 2 sprays into both nostrils daily.  . furosemide (LASIX) 20 MG tablet Take 1 tablet (20 mg total) by mouth daily.  Marland Kitchen lisinopril (PRINIVIL,ZESTRIL) 2.5 MG tablet Take 1 tablet (2.5 mg total) by mouth daily.  . polycarbophil (FIBERCON) 625 MG tablet Take 2 tablets (1,250 mg total) by mouth 2 (two) times daily.  . potassium chloride SA (K-DUR,KLOR-CON) 20 MEQ tablet Take 1 tablet (20 mEq total) by mouth daily.  . pravastatin (PRAVACHOL) 40 MG tablet Take 40 mg by mouth daily.  . tamsulosin (FLOMAX) 0.4 MG CAPS Take 0.4 mg by mouth. 1/2 tablet daily except 1 tablet on Tuesdays and Saturdays  . warfarin (COUMADIN) 5 MG tablet Take 1 tablet (5 mg total)  by mouth as directed by   anticoagulation clinic     REVIEW OF SYSTEMS  : All other systems reviewed and negative except where noted in the History of Present Illness.   PHYSICAL EXAM: BP 106/72  Pulse 70  Ht 5\' 6"  (1.676 m)  Wt 160 lb 3.2 oz (72.666 kg)  BMI 25.87 kg/m2 General: Well developed white male in no acute distress Head: Normocephalic and atraumatic Eyes:  Sclerae anicteric, conjunctiva pink. Ears: Normal auditory acuity  Rectal:  No external abnormalities noted.  No masses or fecal impaction noted on DRE.  Slightly decreased sphincter tone.  Light brown stool noted on exam glove.   Musculoskeletal: Symmetrical with no gross deformities  Skin: No lesions on visible extremities Extremities: No edema  Neurological: Alert oriented x 4, grossly non-focal Psychological:  Alert and cooperative. Normal  mood and affect  ASSESSMENT AND PLAN: -Fecal incontinence/leakage:  Improved with limiting dietary fiber intake.  Likely due to some incompetence of anal sphincter.  I have suggested Kegel exercises and paperwork was given to him.  If no continue improvement then could consider trial of questran powder, but would start at low dose so he does not become constipated.

## 2013-10-18 NOTE — Patient Instructions (Signed)
Information on Kegel Exercises for you to follow  Please follow up as needed or call back if you are not feeling better

## 2013-10-19 NOTE — Progress Notes (Signed)
Agree with Ms. Alphia Kava management. Would also consider anoscopic evaluation ? If incontinence related to hemorrhoids.  Gatha Mayer, MD, [facility]

## 2013-11-01 ENCOUNTER — Other Ambulatory Visit: Payer: Self-pay | Admitting: Cardiology

## 2013-11-06 ENCOUNTER — Encounter: Payer: Self-pay | Admitting: Internal Medicine

## 2013-11-06 ENCOUNTER — Telehealth: Payer: Self-pay | Admitting: Cardiology

## 2013-11-06 ENCOUNTER — Ambulatory Visit (INDEPENDENT_AMBULATORY_CARE_PROVIDER_SITE_OTHER): Payer: Medicare Other | Admitting: *Deleted

## 2013-11-06 DIAGNOSIS — M25569 Pain in unspecified knee: Secondary | ICD-10-CM | POA: Diagnosis not present

## 2013-11-06 DIAGNOSIS — I4891 Unspecified atrial fibrillation: Secondary | ICD-10-CM | POA: Diagnosis not present

## 2013-11-06 DIAGNOSIS — I495 Sick sinus syndrome: Secondary | ICD-10-CM

## 2013-11-06 DIAGNOSIS — I480 Paroxysmal atrial fibrillation: Secondary | ICD-10-CM

## 2013-11-06 LAB — MDC_IDC_ENUM_SESS_TYPE_REMOTE
Brady Statistic AP VP Percent: 97 %
Brady Statistic AP VS Percent: 2.2 %
Brady Statistic AS VP Percent: 1 %
Brady Statistic AS VS Percent: 1 %
Brady Statistic RA Percent Paced: 99 %
Date Time Interrogation Session: 20150831203745
Lead Channel Impedance Value: 460 Ohm
Lead Channel Pacing Threshold Amplitude: 0.75 V
Lead Channel Pacing Threshold Pulse Width: 0.5 ms
Lead Channel Sensing Intrinsic Amplitude: 8.8 mV
Lead Channel Setting Pacing Amplitude: 2 V
Lead Channel Setting Pacing Amplitude: 2 V
Lead Channel Setting Pacing Amplitude: 2.5 V
Lead Channel Setting Pacing Pulse Width: 0.4 ms
Lead Channel Setting Sensing Sensitivity: 2.5 mV
MDC IDC MSMT BATTERY REMAINING LONGEVITY: 79 mo
MDC IDC MSMT BATTERY REMAINING PERCENTAGE: 91 %
MDC IDC MSMT BATTERY VOLTAGE: 2.98 V
MDC IDC MSMT LEADCHNL LV IMPEDANCE VALUE: 780 Ohm
MDC IDC MSMT LEADCHNL LV PACING THRESHOLD AMPLITUDE: 0.75 V
MDC IDC MSMT LEADCHNL LV PACING THRESHOLD PULSEWIDTH: 0.5 ms
MDC IDC MSMT LEADCHNL RA IMPEDANCE VALUE: 460 Ohm
MDC IDC MSMT LEADCHNL RA SENSING INTR AMPL: 2.8 mV
MDC IDC MSMT LEADCHNL RV PACING THRESHOLD AMPLITUDE: 1 V
MDC IDC MSMT LEADCHNL RV PACING THRESHOLD PULSEWIDTH: 0.4 ms
MDC IDC PG SERIAL: 2926516
MDC IDC SET LEADCHNL LV PACING PULSEWIDTH: 0.5 ms

## 2013-11-06 LAB — POCT INR: INR: 2.7

## 2013-11-06 NOTE — Telephone Encounter (Signed)
LMOVM reminding pt to send remote transmission.   

## 2013-11-07 NOTE — Progress Notes (Signed)
Remote pacemaker transmission.   

## 2013-11-14 DIAGNOSIS — M6281 Muscle weakness (generalized): Secondary | ICD-10-CM | POA: Diagnosis not present

## 2013-11-14 DIAGNOSIS — M25569 Pain in unspecified knee: Secondary | ICD-10-CM | POA: Diagnosis not present

## 2013-11-16 ENCOUNTER — Encounter: Payer: Self-pay | Admitting: Cardiology

## 2013-11-20 DIAGNOSIS — M6281 Muscle weakness (generalized): Secondary | ICD-10-CM | POA: Diagnosis not present

## 2013-11-20 DIAGNOSIS — M25569 Pain in unspecified knee: Secondary | ICD-10-CM | POA: Diagnosis not present

## 2013-11-21 ENCOUNTER — Encounter: Payer: Self-pay | Admitting: Cardiology

## 2013-11-30 ENCOUNTER — Encounter: Payer: Self-pay | Admitting: Cardiology

## 2013-11-30 ENCOUNTER — Ambulatory Visit (INDEPENDENT_AMBULATORY_CARE_PROVIDER_SITE_OTHER): Payer: Medicare Other

## 2013-11-30 ENCOUNTER — Ambulatory Visit (INDEPENDENT_AMBULATORY_CARE_PROVIDER_SITE_OTHER): Payer: Medicare Other | Admitting: Cardiology

## 2013-11-30 VITALS — BP 88/56 | HR 72 | Ht 66.0 in | Wt 157.0 lb

## 2013-11-30 DIAGNOSIS — I4891 Unspecified atrial fibrillation: Secondary | ICD-10-CM

## 2013-11-30 DIAGNOSIS — I5022 Chronic systolic (congestive) heart failure: Secondary | ICD-10-CM

## 2013-11-30 DIAGNOSIS — I480 Paroxysmal atrial fibrillation: Secondary | ICD-10-CM

## 2013-11-30 DIAGNOSIS — I509 Heart failure, unspecified: Secondary | ICD-10-CM

## 2013-11-30 LAB — BASIC METABOLIC PANEL
BUN: 22 mg/dL (ref 6–23)
CALCIUM: 9 mg/dL (ref 8.4–10.5)
CHLORIDE: 103 meq/L (ref 96–112)
CO2: 31 meq/L (ref 19–32)
Creatinine, Ser: 1.1 mg/dL (ref 0.4–1.5)
GFR: 71.77 mL/min (ref >60.00)
GLUCOSE: 70 mg/dL (ref 70–99)
POTASSIUM: 4.3 meq/L (ref 3.5–5.1)
Sodium: 137 mEq/L (ref 135–145)

## 2013-11-30 LAB — TSH: TSH: 2.26 u[IU]/mL (ref 0.35–4.50)

## 2013-11-30 LAB — HEPATIC FUNCTION PANEL
ALK PHOS: 65 U/L (ref 39–117)
ALT: 22 U/L (ref 0–53)
AST: 25 U/L (ref 0–37)
Albumin: 3.9 g/dL (ref 3.5–5.2)
Bilirubin, Direct: 0.1 mg/dL (ref 0.0–0.3)
TOTAL PROTEIN: 7 g/dL (ref 6.0–8.3)
Total Bilirubin: 0.8 mg/dL (ref 0.2–1.2)

## 2013-11-30 LAB — POCT INR: INR: 1.9

## 2013-11-30 NOTE — Progress Notes (Signed)
Patient ID: [name], male   DOB: 1930/01/12, 80 y.o.   MRN: [id number] PCP: Dr. Yong Channel  80 yo with history of paroxysmal atrial fibrillation, nonischemic cardiomyopathy, and complete heart block with St Jude CRT-P system presents for cardiology followup.  He was admitted in 4/14 with pacemaker pocket infection.  His pacemaker was removed and temporary-permanent device was placed.  Later, he had the temporary-permanent device removed and a new CRT-P device was placed. He developed dyspnea post-operatively and was found to have a large right-sided pleural effusion.  He was admitted and got a chest tube for drainage. He had followup with Dr. Servando Snare and it was decided that he would not need VATS.  He had been on amiodarone for maintenance of NSR.  This had been more successful than Tikosyn.   Around 3/15, he started developing increasing exertional dyspnea.  He was short of breath walking up a hill or incline.  He could still walk on the treadmill for exercise for 10-15 minutes and use the elliptical without much trouble on most days.  No orthopnea or PND.  No chest pain.  He saw Dr Leanne Chang and was started on Lasix three times a week due to concern for volume overload.  This did not seem to have helped much.  After that, he had PFTs done which showed normal spirometry but DLCO 50% predicted.  Therefore, amiodarone was stopped due to concern for lung toxicity.  He saw Richardson Dopp for a pre-operative evaluation prior to right inguinal hernia repair.  Given the exertional dyspnea, he was set up for adenosine Cardiolite in 5/15.  This showed EF 36% with a small area of primarily scar in the apex, apical lateral wall, and mid anterolateral wall.  He has not felt palpitations. Echo in 5/15 showed EF 40-45%, similar to prior study.   At a prior visit, I thought he was volume overloaded.  I increased his Lasix to 40 mg daily, later cut back to 20 mg daily.  I had him see pulmonary for evaluation for amiodarone  lung toxicity.  CT chest looked ok, and he was not thought to have amiodarone lung toxicity.   Currently, he is doing quite well.  Weight is stable.  No exertional dyspnea or chest pain.  He works out most days.  He has cut his amiodarone back to 100 mg daily as had been planned at last appointment.  PCM interrogation today shows no recent atrial fibrillation.  BP low today but no lightheadedness, falls, or weakness.   ECG: A-V sequential pacing  Labs (7/13); LDL 65, HDL 38 Labs (3/14): TSH normal Labs (4/14):  ALT 61, AST 62 Labs (5/14): K 4.5, creatinine 0.9, BNP 727, digoxin 0.9 Labs (6/14): LDL 77, HDL 34, K 4, creatinine 0.9, TSH normal Labs (10/14): digoxin 2.1, K 4.7, creatinine 1.0, TSH normal, LFTs normal Labs (3/15): K 4, creatinine 1.1 Labs (4/15): BNP 482 Labs (5/15): K 3.8, creatinine 1.2 Labs (6/15): K 4.3, creatinine 0.9 Labs (7/15): TSH normal, LFTs normal  PMH: 1. Low back pain 2. Hyperlipidemia 3. GERD 4. BPH 5. Polymyalgia rheumatica 6. Atrial fibrillation: Failed Tikosyn in the past.  Paroxysmal, on amiodarone and warfarin.  DCCV in 4/13, 7/13, 10/13.  Has held NSR with amiodarone.  However, concern for amiodarone lung toxicity: PFTs (4/15) with FVC 90% predicted, FEV1 90%, ratio 97%, DLCO 50%.   7. Transaminitis: Mild, attributed to amiodarone.  8. Complete heart block s/p St Jude CRT-P device.  Patient developed PCM pocket  infection in 4/14 and had his first system removed with placement of a temporary permanent PCM.  He later had CRT-P device replaced.  This was complicated by large right-sided hemorrhagic pleural effusion requiring chest tube.   9. Nonischemic cardiomyopathy: Echo (9/13) with EF 40-45%, diffuse HK, mild MR.  Mild CAD only on prior LHC.  Echo (4/14) with EF 45-50%.  Adenosine Cardiolite (5/15) with EF 36% (visually appeared higher per report), small area of scar with peri-infarct ischemia in the apex, apical lateral wall, and mid anterolateral wall.  Echo (5/15) with EF 40-45%, basal to mid inferolateral severe hypokinesis, basal to mid anterolateral hypokinesis, mildly dilated RV with mildly decreased RV systolic function, mild to moderate MR.   10. Large right pleural effusion requiring chest tube following removal and reimplantation of PCM. Most recent chest CT in 6/15 showed significant decrease in size of loculated pleural effusion since 5/14.    SH: Married, prior smoker (quit 1985), no ETOH x years, retired, lives at [employer]  [place]: Father with MI at 4, mother with AAA.   ROS: All systems reviewed and negative except as per HPI.   Current Outpatient Prescriptions  Medication Sig Dispense Refill  . amiodarone (PACERONE) 200 MG tablet Take 100 mg by mouth daily.      . carvedilol (COREG) 6.25 MG tablet Pt takes 1/ 2 of a 12.5mg  tablet two times a day      . fluticasone (FLONASE) 50 MCG/ACT nasal spray Place 2 sprays into both nostrils daily.  16 g  2  . furosemide (LASIX) 20 MG tablet Take 1 tablet (20 mg total) by mouth daily.  90 tablet  3  . lisinopril (PRINIVIL,ZESTRIL) 2.5 MG tablet Take 1 tablet (2.5 mg total) by mouth daily.  90 tablet  3  . polycarbophil (FIBERCON) 625 MG tablet Take 625 mg by mouth 2 (two) times daily.      . potassium chloride SA (K-DUR,KLOR-CON) 20 MEQ tablet Take 1 tablet by mouth  daily  90 tablet  0  . tamsulosin (FLOMAX) 0.4 MG CAPS Take 0.4 mg by mouth daily after supper.       . warfarin (COUMADIN) 5 MG tablet Take 1 tablet (5 mg total)  by mouth as directed by   anticoagulation clinic  90 tablet  0  . pravastatin (PRAVACHOL) 40 MG tablet Take 40 mg by mouth daily.       No current facility-administered medications for this visit.    BP 88/56  Pulse 72  Ht 5\' 6"  (1.676 m)  Wt 157 lb (71.215 kg)  BMI 25.35 kg/m2 General: NAD Neck: JVP 7 cm, no thyromegaly or thyroid nodule.  Lungs: CTAB  CV: Nondisplaced PMI.  Heart regular S1/S2, no S3/S4, no murmur.  No edema.  No carotid bruit.  Normal  pedal pulses.  Abdomen: Soft, nontender, no hepatosplenomegaly, no distention.  Neurologic: Alert and oriented x 3.  Psych: Normal affect. Extremities: No clubbing or cyanosis.   Assessment/Plan: 1. Chronic systolic CHF: NYHA class II symptoms with no significant volume overload on exam today.  He has CRT-P.  Last echo in 5/15 with EF 40-45% and wall motion abnormalities, similar to prior echoes.  Presumed nonischemic cardiomyopathy (prior cath with only mild CAD).  He had an adenosine Cardiolite recently with EF 36% and possible small area of apical and lateral scar with peri-infarct ischemia.   - Continue Lasix 20 mg daily with BMET today.  - Continue current Coreg and lisinopril.  He has been  unable to tolerate a higher lisinopril dose due to fatigue and BP remains soft today. - Given lack of chest pain, improved symptoms, and similar EF by echo compared to prior studies, I would hold off for now on cardiac cath.  2. Atrial fibrillation: Paroxysmal.  He is holding NSR on amiodarone, which was recently decreased to 100 mg daily.  PCM check today showed no recent atrial fibrillation.  He tends to be very symptomatic in atrial fibrillation.  He had breakthrough atrial fibrillation on Tikosyn and is not a candidate for Ic agents.  He was seen by pulmonary and amiodarone lung toxicity is thought to be unlikely.   - LFTs/TSH today, will get yearly eye exam.  3. Hyperlipidemia: History of nonobstructive CAD.  Good lipids on pravastatin.    [name] 11/30/2013

## 2013-11-30 NOTE — Patient Instructions (Signed)
Your physician recommends that you have lab work today--BMET/TSH/Liver profile.   Your physician wants you to follow-up in: 4 months with Dr Aundra Dubin. (January 2016).  You will receive a reminder letter in the mail two months in advance. If you don't receive a letter, please call our office to schedule the follow-up appointment.

## 2013-12-02 ENCOUNTER — Other Ambulatory Visit: Payer: Self-pay | Admitting: Internal Medicine

## 2013-12-02 ENCOUNTER — Other Ambulatory Visit: Payer: Self-pay | Admitting: Cardiology

## 2013-12-04 ENCOUNTER — Other Ambulatory Visit (INDEPENDENT_AMBULATORY_CARE_PROVIDER_SITE_OTHER): Payer: Self-pay | Admitting: Surgery

## 2013-12-04 DIAGNOSIS — M25569 Pain in unspecified knee: Secondary | ICD-10-CM | POA: Diagnosis not present

## 2013-12-04 DIAGNOSIS — K4091 Unilateral inguinal hernia, without obstruction or gangrene, recurrent: Secondary | ICD-10-CM | POA: Diagnosis not present

## 2013-12-05 DIAGNOSIS — M25569 Pain in unspecified knee: Secondary | ICD-10-CM | POA: Diagnosis not present

## 2013-12-05 DIAGNOSIS — M6281 Muscle weakness (generalized): Secondary | ICD-10-CM | POA: Diagnosis not present

## 2013-12-12 ENCOUNTER — Telehealth: Payer: Self-pay | Admitting: Cardiology

## 2013-12-12 NOTE — Telephone Encounter (Signed)
LMTCB

## 2013-12-12 NOTE — Telephone Encounter (Signed)
New message     Talk to the nurse regarding getting clearance for surgery

## 2013-12-12 NOTE — Telephone Encounter (Signed)
Spoke with pt and he states has not heard from DR Tsuei regarding date of surgery .Pt states when Dr Georgette Dover office calls him he will instruct them to call our office . Pt instructed that we will need to make him an appt to be seen in clinic when date of surgery has been scheduled and he states understanding.

## 2013-12-12 NOTE — Telephone Encounter (Signed)
Pt given Dr Claris Gladden recommendations regarding surgery and Lovenox bridge.

## 2013-12-12 NOTE — Telephone Encounter (Signed)
Will forward to Dr Georgette Dover and CV[facility].

## 2013-12-12 NOTE — Telephone Encounter (Signed)
[facility] for surgery, with cardiomyopathy probably best to bridge with Lovenox.

## 2013-12-12 NOTE — Telephone Encounter (Signed)
Pt states he needs clearance for hernia repair by Dr Georgette Dover.  I will forward to Dr Aundra Dubin for review.

## 2013-12-13 ENCOUNTER — Ambulatory Visit (INDEPENDENT_AMBULATORY_CARE_PROVIDER_SITE_OTHER): Payer: Self-pay | Admitting: Surgery

## 2013-12-22 ENCOUNTER — Encounter: Payer: Self-pay | Admitting: Internal Medicine

## 2013-12-22 ENCOUNTER — Ambulatory Visit (INDEPENDENT_AMBULATORY_CARE_PROVIDER_SITE_OTHER): Payer: Medicare Other | Admitting: Internal Medicine

## 2013-12-22 ENCOUNTER — Telehealth: Payer: Self-pay

## 2013-12-22 VITALS — BP 90/50 | HR 84 | Ht 65.5 in | Wt 157.5 lb

## 2013-12-22 DIAGNOSIS — R194 Change in bowel habit: Secondary | ICD-10-CM | POA: Diagnosis not present

## 2013-12-22 DIAGNOSIS — I2510 Atherosclerotic heart disease of native coronary artery without angina pectoris: Secondary | ICD-10-CM

## 2013-12-22 DIAGNOSIS — R159 Full incontinence of feces: Secondary | ICD-10-CM

## 2013-12-22 NOTE — Telephone Encounter (Signed)
Spoke with patient and informed him to hold the warfarin after his Oct. 23 dose (4 days prior to his procedure).  He verbalized understanding.

## 2013-12-22 NOTE — Telephone Encounter (Signed)
May hold warfarin for 4 days prior to procedure, restart afterwards.

## 2013-12-22 NOTE — Progress Notes (Signed)
Patient ID: [name], male   DOB: January 17, 1930, 80 y.o.   MRN: [id number]     History of Present Illness: This is a followup for this delightful 80 year old male who was last seen on 10/18/2013. He is known to Dr. Carlean Purl, and most recently had a colonoscopy in 2006 at which time he was found to have diverticulosis. When he was here in August. He was complaining of some anal leakage and fecal incontinence he has decrease the amount of salad in his diet as he feels it increase the amount of incontinence he was having since his last visit, he has tried  Kegel exercises several times per day but feels it is not helping. He is currently having 4 or 5 small formed bowel movements daily and typically each day he has one fecal accident. Because of this he wears a diaper daily and states he he only finds out he had a bowel movement after he passed. He has trouble cleaning up because he frequently has leakage. He has been using one FiberCon capsule bedtime and feels it does not help he does not feel he is constipated.Pt feels his rectum never fully empties.   Past Medical History  Diagnosis Date  . Coronary artery disease   . Cardiomyopathy primary-nonischemic EF 45%  . Hyperlipidemia   . GERD (gastroesophageal reflux disease)   . Polymyalgia rheumatica   . Sick sinus syndrome   . Atrial fibrillation   . Dysphagia   . Hearing loss, mixed, bilateral   . Increased prostate specific antigen (PSA) velocity   . Lumbar back pain   . BPH (benign prostatic hypertrophy)   . Breast mass     "on both sides" (06/28/2012)  . CHF (congestive heart failure)   . Elevated liver enzymes   . Esophageal stricture   . Pacemaker   . Arthritis     "some" (06/28/2012)  . Pacemaker infection     06/28/12  . Hx of cardiovascular stress test     Adenosine Myoview (07/2013):  Apical cap, apical lateral and mid anterolateral scar, small amount of peri-infarct ischemia, EF 36%; Medium Risk    Past Surgical History    Procedure Laterality Date  . Cataract extraction w/ intraocular lens  implant, bilateral Bilateral   . Umbilical hernia repair    . Insert / replace / remove pacemaker    . Inguinal hernia repair Right 2012    RIH  . Umbilical hernia repair    . Cardioversion  06/17/2011    Procedure: CARDIOVERSION;  Surgeon: Lelon Perla, MD;  Location: [place];  Service: Cardiovascular;  Laterality: N/A;  . Cardioversion  09/09/2011    Procedure: CARDIOVERSION;  Surgeon: Carlena Bjornstad, MD;  Location: [place];  Service: Cardiovascular;  Laterality: N/A;  . Cardioversion  01/01/2012    Procedure: CARDIOVERSION;  Surgeon: Hillary Bow, MD;  Location: [facility] ENDOSCOPY;  Service: Cardiovascular;  Laterality: N/A;  . Tee without cardioversion  01/01/2012    Procedure: TRANSESOPHAGEAL ECHOCARDIOGRAM (TEE);  Surgeon: Fay Records, MD;  Location: [place];  Service: Cardiovascular;  Laterality: N/A;  . Eye surgery    . Generator removal Left 06/15/2012    Procedure: GENERATOR REMOVAL;  Surgeon: Evans Lance, MD;  Location: [place];  Service: Cardiovascular;  Laterality: Left;  . Pacemaker generator change N/A 06/15/2012    Procedure: PACEMAKER GENERATOR CHANGE;  Surgeon: Evans Lance, MD;  Location: [place];  Service: Cardiovascular;  Laterality: N/A;  . Tonsillectomy and  adenoidectomy    . Cardiac catheterization    . Esophagogastroduodenoscopy    . Colonoscopy     Family History  Problem Relation Age of Onset  . Heart attack Father 66    deceased  . AAA (abdominal aortic aneurysm) Mother     deceased AAA  . Hypertension Mother   . Other Brother     deceased at birth   History  Substance Use Topics  . Smoking status: Former Smoker -- 1.00 packs/day for 40 years    Types: Cigarettes    Quit date: 03/10/1983  . Smokeless tobacco: Never Used  . Alcohol Use: No   Current Outpatient Prescriptions  Medication Sig Dispense Refill  . amiodarone (PACERONE) 200 MG tablet Take 100 mg by mouth daily.       . carvedilol (COREG) 6.25 MG tablet Pt takes 1/ 2 of a 12.5mg  tablet two times a day      . fluticasone (FLONASE) 50 MCG/ACT nasal spray Place 2 sprays into both nostrils daily.  16 g  2  . furosemide (LASIX) 20 MG tablet Take 1 tablet (20 mg total) by mouth daily.  90 tablet  3  . lisinopril (PRINIVIL,ZESTRIL) 2.5 MG tablet Take 1 tablet (2.5 mg total) by mouth daily.  90 tablet  3  . polycarbophil (FIBERCON) 625 MG tablet Take 625 mg by mouth daily.       . potassium chloride SA (K-DUR,KLOR-CON) 20 MEQ tablet Take 1 tablet by mouth  daily  90 tablet  0  . pravastatin (PRAVACHOL) 40 MG tablet Take 20 mg by mouth daily.       . tamsulosin (FLOMAX) 0.4 MG CAPS Take 0.4 mg by mouth daily.       Marland Kitchen warfarin (COUMADIN) 5 MG tablet Take 1 tablet by mouth as  directed by anticoagulation clinic  90 tablet  1   No current facility-administered medications for this visit.   Allergies  Allergen Reactions  . Antihistamines, Diphenhydramine-Type Other (See Comments)    Causes difficulty in ability to urinate.  . Amiodarone     Amiodarone induced lung toxicity      Review of Systems: Gen: Denies any fever, chills CV: Denies chest pain, angina Resp: neg GI: Denies vomiting blood, jaundice, and fecal incontinence.   Denies dysphagia or odynophagia.Admits to fecal incontinence GU : Denies urinary burning MS:neg.  Derm: neg Psych: neg Heme: neg Neuro:neg Endo:  neg    Physical Exam: General: Pleasant, well developed , well nourished male in no acute distress Head: Normocephalic and atraumatic Eyes:  sclerae anicteric, conjunctiva pink  Ears: Normal auditory acuity Lungs: Clear throughout to auscultation Heart: Regular rate and rhythm Abdomen: Soft, non distended, non-tender. No masses, no hepatomegaly. Normal bowel sounds Rectal: anal wink present, diminished sphincter tone, resting and voluntary, no mass and no impaction Musculoskeletal: Symmetrical with no gross deformities   Extremities: No edema  Neurological: Alert oriented x 4, grossly nonfocal Psychological:  Alert and cooperative. Normal mood and affect  Assessment and Recommendations: 1. Change in bowl habits, fecal incontinence. Pt is to be scheduled for a colonoscopy to evaluate for polyps, neoplasia, inflammatory changes, etc.The risk of holding anticoagulation therapy or antiplatelet medications was discussed including the increased risk for thromboembolic disease that may include DVT, pulmonary emboli and stroke. The patient understands this risk and is willing to proceed with temporally holding the medication provided that this is approved by her PCP or cardiologist.The risks, benefits, and alternatives to colonoscopy with possible biopsy and  possible polypectomy were discussed with the patient and they consent to proceed. Further recommendations will be made pending the findings of colonoscopy. Pt may be a candidate for miralax to help fully evacuate.   I have seen the patient with Ms. Mina Babula who served as a Education administrator also.  Agree Gatha Mayer, MD, [facility]

## 2013-12-22 NOTE — Telephone Encounter (Signed)
[facility]  [address] [place] [address]  12/22/2013   RE: [name] DOB: [birth year] MRN: [id number]   Dear [name] M.D.,    We have scheduled the above patient for an endoscopic procedure. Our records show that he is on anticoagulation therapy.   Please advise as to how long the patient may come off his therapy of coumadin prior to the colonoscopy procedure, which is scheduled for 01/03/14.  Please fax back/ or route the completed form to Sarabelle Genson [place], [place] at [phone].   Sincerely,    Silvano Rusk, M.D.

## 2013-12-22 NOTE — Patient Instructions (Signed)
You have been scheduled for a colonoscopy. Please follow written instructions given to you at your visit today.  Please pick up your prep supplies at the pharmacy. If you use inhalers (even only as needed), please bring them with you on the day of your procedure. Your physician has requested that you go to www.startemmi.com and enter the access code given to you at your visit today. This web site gives a general overview about your procedure. However, you should still follow specific instructions given to you by our office regarding your preparation for the procedure.   You will be contaced by our office prior to your procedure for directions on holding your Coumadin/Warfarin.  If you do not hear from our office 1 week prior to your scheduled procedure, please call [phone] to discuss.   I appreciate the opportunity to care for you.

## 2013-12-23 DIAGNOSIS — Z23 Encounter for immunization: Secondary | ICD-10-CM | POA: Diagnosis not present

## 2014-01-03 ENCOUNTER — Encounter (HOSPITAL_COMMUNITY)
Admission: RE | Disposition: A | Discharge status: Home / Self Care | Payer: Self-pay | Source: Ambulatory Visit | Attending: Internal Medicine

## 2014-01-03 ENCOUNTER — Encounter (HOSPITAL_COMMUNITY): Payer: Self-pay | Admitting: *Deleted

## 2014-01-03 ENCOUNTER — Ambulatory Visit (HOSPITAL_COMMUNITY)
Admission: RE | Admit: 2014-01-03 | Discharge: 2014-01-03 | Disposition: A | Discharge status: Home / Self Care | Payer: Medicare Other | Source: Ambulatory Visit | Attending: Internal Medicine | Admitting: Internal Medicine

## 2014-01-03 DIAGNOSIS — Z950 Presence of cardiac pacemaker: Secondary | ICD-10-CM | POA: Insufficient documentation

## 2014-01-03 DIAGNOSIS — I429 Cardiomyopathy, unspecified: Secondary | ICD-10-CM | POA: Diagnosis not present

## 2014-01-03 DIAGNOSIS — Z87891 Personal history of nicotine dependence: Secondary | ICD-10-CM | POA: Diagnosis not present

## 2014-01-03 DIAGNOSIS — R194 Change in bowel habit: Secondary | ICD-10-CM | POA: Diagnosis not present

## 2014-01-03 DIAGNOSIS — K5790 Diverticulosis of intestine, part unspecified, without perforation or abscess without bleeding: Secondary | ICD-10-CM | POA: Diagnosis not present

## 2014-01-03 DIAGNOSIS — K5730 Diverticulosis of large intestine without perforation or abscess without bleeding: Secondary | ICD-10-CM | POA: Diagnosis not present

## 2014-01-03 DIAGNOSIS — K219 Gastro-esophageal reflux disease without esophagitis: Secondary | ICD-10-CM | POA: Insufficient documentation

## 2014-01-03 DIAGNOSIS — Z79899 Other long term (current) drug therapy: Secondary | ICD-10-CM | POA: Insufficient documentation

## 2014-01-03 DIAGNOSIS — R159 Full incontinence of feces: Secondary | ICD-10-CM | POA: Insufficient documentation

## 2014-01-03 DIAGNOSIS — I4891 Unspecified atrial fibrillation: Secondary | ICD-10-CM | POA: Insufficient documentation

## 2014-01-03 DIAGNOSIS — M353 Polymyalgia rheumatica: Secondary | ICD-10-CM | POA: Diagnosis not present

## 2014-01-03 DIAGNOSIS — I509 Heart failure, unspecified: Secondary | ICD-10-CM | POA: Insufficient documentation

## 2014-01-03 DIAGNOSIS — I2510 Atherosclerotic heart disease of native coronary artery without angina pectoris: Secondary | ICD-10-CM | POA: Diagnosis not present

## 2014-01-03 DIAGNOSIS — N400 Enlarged prostate without lower urinary tract symptoms: Secondary | ICD-10-CM | POA: Diagnosis not present

## 2014-01-03 DIAGNOSIS — K648 Other hemorrhoids: Secondary | ICD-10-CM | POA: Diagnosis not present

## 2014-01-03 DIAGNOSIS — Z888 Allergy status to other drugs, medicaments and biological substances status: Secondary | ICD-10-CM | POA: Diagnosis not present

## 2014-01-03 DIAGNOSIS — E785 Hyperlipidemia, unspecified: Secondary | ICD-10-CM | POA: Insufficient documentation

## 2014-01-03 HISTORY — PX: COLONOSCOPY: SHX5424

## 2014-01-03 HISTORY — DX: Diverticulosis of large intestine without perforation or abscess without bleeding: K57.30

## 2014-01-03 HISTORY — DX: Other hemorrhoids: K64.8

## 2014-01-03 SURGERY — COLONOSCOPY
Anesthesia: Moderate Sedation

## 2014-01-03 MED ORDER — BENEFIBER PO POWD: ORAL | Status: DC

## 2014-01-03 MED ORDER — SODIUM CHLORIDE 0.9 % IV SOLN
INTRAVENOUS | Status: DC
  Administered 2014-01-03: 500 mL via INTRAVENOUS

## 2014-01-03 MED ORDER — MIDAZOLAM HCL 5 MG/5ML IJ SOLN
INTRAMUSCULAR | Status: DC | PRN
  Administered 2014-01-03 (×3): 1 mg via INTRAVENOUS

## 2014-01-03 MED ORDER — FENTANYL CITRATE 0.05 MG/ML IJ SOLN
INTRAMUSCULAR | Status: DC | PRN
  Administered 2014-01-03 (×2): 25 ug via INTRAVENOUS

## 2014-01-03 MED ORDER — FENTANYL CITRATE 0.05 MG/ML IJ SOLN
INTRAMUSCULAR | Status: AC
  Filled 2014-01-03: qty 2

## 2014-01-03 MED ORDER — MIDAZOLAM HCL 10 MG/2ML IJ SOLN
INTRAMUSCULAR | Status: AC
  Filled 2014-01-03: qty 2

## 2014-01-03 NOTE — Interval H&P Note (Signed)
History and Physical Interval Note:  01/03/2014 10:49 AM  [name]  has presented today for surgery, with the diagnosis of Change in bowel habits  The various methods of treatment have been discussed with the patient and family. After consideration of risks, benefits and other options for treatment, the patient has consented to  Procedure(s): COLONOSCOPY (N/A) as a surgical intervention .  The patient's history has been reviewed, patient examined, no change in status, stable for surgery.  I have reviewed the patient's chart and labs.  Questions were answered to the patient's satisfaction.     Silvano Rusk

## 2014-01-03 NOTE — H&P (View-Only) (Signed)
Patient ID: [name], male   DOB: [birth year], 80 y.o.   MRN: [id number]     History of Present Illness: This is a followup for this delightful 80 year old male who was last seen on 10/18/2013. He is known to Dr. Carlean Purl, and most recently had a colonoscopy in 2006 at which time he was found to have diverticulosis. When he was here in August. He was complaining of some anal leakage and fecal incontinence he has decrease the amount of salad in his diet as he feels it increase the amount of incontinence he was having since his last visit, he has tried  Kegel exercises several times per day but feels it is not helping. He is currently having 4 or 5 small formed bowel movements daily and typically each day he has one fecal accident. Because of this he wears a diaper daily and states he he only finds out he had a bowel movement after he passed. He has trouble cleaning up because he frequently has leakage. He has been using one FiberCon capsule bedtime and feels it does not help he does not feel he is constipated.Pt feels his rectum never fully empties.   Past Medical History  Diagnosis Date  . Coronary artery disease   . Cardiomyopathy primary-nonischemic EF 45%  . Hyperlipidemia   . GERD (gastroesophageal reflux disease)   . Polymyalgia rheumatica   . Sick sinus syndrome   . Atrial fibrillation   . Dysphagia   . Hearing loss, mixed, bilateral   . Increased prostate specific antigen (PSA) velocity   . Lumbar back pain   . BPH (benign prostatic hypertrophy)   . Breast mass     "on both sides" (06/28/2012)  . CHF (congestive heart failure)   . Elevated liver enzymes   . Esophageal stricture   . Pacemaker   . Arthritis     "some" (06/28/2012)  . Pacemaker infection     06/28/12  . Hx of cardiovascular stress test     Adenosine Myoview (07/2013):  Apical cap, apical lateral and mid anterolateral scar, small amount of peri-infarct ischemia, EF 36%; Medium Risk    Past Surgical History    Procedure Laterality Date  . Cataract extraction w/ intraocular lens  implant, bilateral Bilateral   . Umbilical hernia repair    . Insert / replace / remove pacemaker    . Inguinal hernia repair Right 2012    RIH  . Umbilical hernia repair    . Cardioversion  06/17/2011    Procedure: CARDIOVERSION;  Surgeon: Lelon Perla, MD;  Location: [place];  Service: Cardiovascular;  Laterality: N/A;  . Cardioversion  09/09/2011    Procedure: CARDIOVERSION;  Surgeon: Carlena Bjornstad, MD;  Location: [place];  Service: Cardiovascular;  Laterality: N/A;  . Cardioversion  01/01/2012    Procedure: CARDIOVERSION;  Surgeon: Hillary Bow, MD;  Location: [facility] ENDOSCOPY;  Service: Cardiovascular;  Laterality: N/A;  . Tee without cardioversion  01/01/2012    Procedure: TRANSESOPHAGEAL ECHOCARDIOGRAM (TEE);  Surgeon: Fay Records, MD;  Location: [place];  Service: Cardiovascular;  Laterality: N/A;  . Eye surgery    . Generator removal Left 06/15/2012    Procedure: GENERATOR REMOVAL;  Surgeon: Evans Lance, MD;  Location: [place];  Service: Cardiovascular;  Laterality: Left;  . Pacemaker generator change N/A 06/15/2012    Procedure: PACEMAKER GENERATOR CHANGE;  Surgeon: Evans Lance, MD;  Location: [place];  Service: Cardiovascular;  Laterality: N/A;  . Tonsillectomy and  adenoidectomy    . Cardiac catheterization    . Esophagogastroduodenoscopy    . Colonoscopy     Family History  Problem Relation Age of Onset  . Heart attack Father 64    deceased  . AAA (abdominal aortic aneurysm) Mother     deceased AAA  . Hypertension Mother   . Other Brother     deceased at birth   History  Substance Use Topics  . Smoking status: Former Smoker -- 1.00 packs/day for 40 years    Types: Cigarettes    Quit date: 03/10/1983  . Smokeless tobacco: Never Used  . Alcohol Use: No   Current Outpatient Prescriptions  Medication Sig Dispense Refill  . amiodarone (PACERONE) 200 MG tablet Take 100 mg by mouth daily.       . carvedilol (COREG) 6.25 MG tablet Pt takes 1/ 2 of a 12.5mg  tablet two times a day      . fluticasone (FLONASE) 50 MCG/ACT nasal spray Place 2 sprays into both nostrils daily.  16 g  2  . furosemide (LASIX) 20 MG tablet Take 1 tablet (20 mg total) by mouth daily.  90 tablet  3  . lisinopril (PRINIVIL,ZESTRIL) 2.5 MG tablet Take 1 tablet (2.5 mg total) by mouth daily.  90 tablet  3  . polycarbophil (FIBERCON) 625 MG tablet Take 625 mg by mouth daily.       . potassium chloride SA (K-DUR,KLOR-CON) 20 MEQ tablet Take 1 tablet by mouth  daily  90 tablet  0  . pravastatin (PRAVACHOL) 40 MG tablet Take 20 mg by mouth daily.       . tamsulosin (FLOMAX) 0.4 MG CAPS Take 0.4 mg by mouth daily.       Marland Kitchen warfarin (COUMADIN) 5 MG tablet Take 1 tablet by mouth as  directed by anticoagulation clinic  90 tablet  1   No current facility-administered medications for this visit.   Allergies  Allergen Reactions  . Antihistamines, Diphenhydramine-Type Other (See Comments)    Causes difficulty in ability to urinate.  . Amiodarone     Amiodarone induced lung toxicity      Review of Systems: Gen: Denies any fever, chills CV: Denies chest pain, angina Resp: neg GI: Denies vomiting blood, jaundice, and fecal incontinence.   Denies dysphagia or odynophagia.Admits to fecal incontinence GU : Denies urinary burning MS:neg.  Derm: neg Psych: neg Heme: neg Neuro:neg Endo:  neg    Physical Exam: General: Pleasant, well developed , well nourished male in no acute distress Head: Normocephalic and atraumatic Eyes:  sclerae anicteric, conjunctiva pink  Ears: Normal auditory acuity Lungs: Clear throughout to auscultation Heart: Regular rate and rhythm Abdomen: Soft, non distended, non-tender. No masses, no hepatomegaly. Normal bowel sounds Rectal: anal wink present, diminished sphincter tone, resting and voluntary, no mass and no impaction Musculoskeletal: Symmetrical with no gross deformities   Extremities: No edema  Neurological: Alert oriented x 4, grossly nonfocal Psychological:  Alert and cooperative. Normal mood and affect  Assessment and Recommendations: 1. Change in bowl habits, fecal incontinence. Pt is to be scheduled for a colonoscopy to evaluate for polyps, neoplasia, inflammatory changes, etc.The risk of holding anticoagulation therapy or antiplatelet medications was discussed including the increased risk for thromboembolic disease that may include DVT, pulmonary emboli and stroke. The patient understands this risk and is willing to proceed with temporally holding the medication provided that this is approved by her PCP or cardiologist.The risks, benefits, and alternatives to colonoscopy with possible biopsy and  possible polypectomy were discussed with the patient and they consent to proceed. Further recommendations will be made pending the findings of colonoscopy. Pt may be a candidate for miralax to help fully evacuate.   I have seen the patient with Ms. Hvozdovic who served as a Education administrator also.  Agree Gatha Mayer, MD, [facility]

## 2014-01-03 NOTE — Discharge Instructions (Addendum)
I did not find anything bad today. You have diverticulosis and some hemorrhoids.   Please try using benefiber 1-2 tablespoons daily instead of the Fibercon.  If you still have problems with fecal leakage come back to see me.  Good luck with hernia repair!  I appreciate the opportunity to care for you. Gatha Mayer, MD, [facility]  Diverticulosis Diverticulosis is the condition that develops when small pouches (diverticula) form in the wall of your colon. Your colon, or large intestine, is where water is absorbed and stool is formed. The pouches form when the inside layer of your colon pushes through weak spots in the outer layers of your colon. CAUSES  No one knows exactly what causes diverticulosis. RISK FACTORS  Being older than 47. Your risk for this condition increases with age. Diverticulosis is rare in people younger than 40 years. By age 16, almost everyone has it.  Eating a low-fiber diet.  Being frequently constipated.  Being overweight.  Not getting enough exercise.  Smoking.  Taking over-the-counter pain medicines, like aspirin and ibuprofen. SYMPTOMS  Most people with diverticulosis do not have symptoms. DIAGNOSIS  Because diverticulosis often has no symptoms, health care providers often discover the condition during an exam for other colon problems. In many cases, a health care provider will diagnose diverticulosis while using a flexible scope to examine the colon (colonoscopy). TREATMENT  If you have never developed an infection related to diverticulosis, you may not need treatment. If you have had an infection before, treatment may include:  Eating more fruits, vegetables, and grains.  Taking a fiber supplement.  Taking a live bacteria supplement (probiotic).  Taking medicine to relax your colon. HOME CARE INSTRUCTIONS   Drink at least 6-8 glasses of water each day to prevent constipation.  Try not to strain when you have a bowel movement.  Keep  all follow-up appointments. If you have had an infection before:  Increase the fiber in your diet as directed by your health care provider or dietitian.  Take a dietary fiber supplement if your health care provider approves.  Only take medicines as directed by your health care provider. SEEK MEDICAL CARE IF:   You have abdominal pain.  You have bloating.  You have cramps.  You have not gone to the bathroom in 3 days. SEEK IMMEDIATE MEDICAL CARE IF:   Your pain gets worse.  Yourbloating becomes very bad.  You have a fever or chills, and your symptoms suddenly get worse.  You begin vomiting.  You have bowel movements that are bloody or black. MAKE SURE YOU:  Understand these instructions.  Will watch your condition.  Will get help right away if you are not doing well or get worse. Document Released: 11/21/2003 Document Revised: 02/28/2013 Document Reviewed: 01/18/2013 [facility] Patient Information 2015 [place], [place]. This information is not intended to replace advice given to you by your health care provider. Make sure you discuss any questions you have with your health care provider.  Hemorrhoids Hemorrhoids are swollen veins around the rectum or anus. There are two types of hemorrhoids:   Internal hemorrhoids. These occur in the veins just inside the rectum. They may poke through to the outside and become irritated and painful.  External hemorrhoids. These occur in the veins outside the anus and can be felt as a painful swelling or hard lump near the anus. CAUSES  Pregnancy.   Obesity.   Constipation or diarrhea.   Straining to have a bowel movement.   Sitting  for long periods on the toilet.  Heavy lifting or other activity that caused you to strain.  Anal intercourse. SYMPTOMS   Pain.   Anal itching or irritation.   Rectal bleeding.   Fecal leakage.   Anal swelling.   One or more lumps around the anus.  DIAGNOSIS  Your caregiver  may be able to diagnose hemorrhoids by visual examination. Other examinations or tests that may be performed include:   Examination of the rectal area with a gloved hand (digital rectal exam).   Examination of anal canal using a small tube (scope).   A blood test if you have lost a significant amount of blood.  A test to look inside the colon (sigmoidoscopy or colonoscopy). TREATMENT Most hemorrhoids can be treated at home. However, if symptoms do not seem to be getting better or if you have a lot of rectal bleeding, your caregiver may perform a procedure to help make the hemorrhoids get smaller or remove them completely. Possible treatments include:   Placing a rubber band at the base of the hemorrhoid to cut off the circulation (rubber band ligation).   Injecting a chemical to shrink the hemorrhoid (sclerotherapy).   Using a tool to burn the hemorrhoid (infrared light therapy).   Surgically removing the hemorrhoid (hemorrhoidectomy).   Stapling the hemorrhoid to block blood flow to the tissue (hemorrhoid stapling).  HOME CARE INSTRUCTIONS   Eat foods with fiber, such as whole grains, beans, nuts, fruits, and vegetables. Ask your doctor about taking products with added fiber in them (fibersupplements).  Increase fluid intake. Drink enough water and fluids to keep your urine clear or pale yellow.   Exercise regularly.   Go to the bathroom when you have the urge to have a bowel movement. Do not wait.   Avoid straining to have bowel movements.   Keep the anal area dry and clean. Use wet toilet paper or moist towelettes after a bowel movement.   Medicated creams and suppositories may be used or applied as directed.   Only take over-the-counter or prescription medicines as directed by your caregiver.   Take warm sitz baths for 15-20 minutes, 3-4 times a day to ease pain and discomfort.   Place ice packs on the hemorrhoids if they are tender and swollen. Using ice  packs between sitz baths may be helpful.   Put ice in a plastic bag.   Place a towel between your skin and the bag.   Leave the ice on for 15-20 minutes, 3-4 times a day.   Do not use a donut-shaped pillow or sit on the toilet for long periods. This increases blood pooling and pain.  SEEK MEDICAL CARE IF:  You have increasing pain and swelling that is not controlled by treatment or medicine.  You have uncontrolled bleeding.  You have difficulty or you are unable to have a bowel movement.  You have pain or inflammation outside the area of the hemorrhoids. MAKE SURE YOU:  Understand these instructions.  Will watch your condition.  Will get help right away if you are not doing well or get worse. Document Released: 02/21/2000 Document Revised: 02/10/2012 Document Reviewed: 12/29/2011 [facility] Patient Information 2015 [place], [place]. This information is not intended to replace advice given to you by your health care provider. Make sure you discuss any questions you have with your health care provider.    YOU HAD AN ENDOSCOPIC PROCEDURE TODAY: Refer to the procedure report and other information in the discharge instructions given to  you for any specific questions about what was found during the examination. If this information does not answer your questions, please call Dr. Celesta Aver office at [phone] to clarify.   YOU SHOULD EXPECT: Some feelings of bloating in the abdomen. Passage of more gas than usual. Walking can help get rid of the air that was put into your GI tract during the procedure and reduce the bloating. If you had a lower endoscopy (such as a colonoscopy or flexible sigmoidoscopy) you may notice spotting of blood in your stool or on the toilet paper. Some abdominal soreness may be present for a day or two, also.  DIET: Your first meal following the procedure should be a light meal and then it is ok to progress to your normal diet. A half-sandwich or bowl of soup  is an example of a good first meal. Heavy or fried foods are harder to digest and may make you feel nauseous or bloated. Drink plenty of fluids but you should avoid alcoholic beverages for 24 hours.   ACTIVITY: Your care partner should take you home directly after the procedure. You should plan to take it easy, moving slowly for the rest of the day. You can resume normal activity the day after the procedure however YOU SHOULD NOT DRIVE, use power tools, machinery or perform tasks that involve climbing or major physical exertion for 24 hours (because of the sedation medicines used during the test).   SYMPTOMS TO REPORT IMMEDIATELY: A gastroenterologist can be reached at any hour. Please call [phone]  for any of the following symptoms:  Following lower endoscopy (colonoscopy, flexible sigmoidoscopy) Excessive amounts of blood in the stool  Significant tenderness, worsening of abdominal pains  Swelling of the abdomen that is new, acute  Fever of 100 or higher

## 2014-01-03 NOTE — Op Note (Signed)
[facility] [place], [id number]   COLONOSCOPY PROCEDURE REPORT  PATIENT: [name], [name]  MR#: [id number] BIRTHDATE: 1930-02-17 , 84  yrs. old GENDER: male ENDOSCOPIST: Gatha Mayer, MD, [facility] REFERRED BY: PROCEDURE DATE:  01/03/2014 PROCEDURE:   Colonoscopy, diagnostic First Screening Colonoscopy - Avg.  risk and is 50 yrs.  old or older - No.  Prior Negative Screening - Now for repeat screening. N/A  History of Adenoma - Now for follow-up colonoscopy & has been > or = to 3 yrs.  N/A ASA CLASS:   Class III INDICATIONS:change in bowel habits. MEDICATIONS: Fentanyl 50 mcg IV and Versed 3 mg IV  DESCRIPTION OF PROCEDURE:   After the risks benefits and alternatives of the procedure were thoroughly explained, informed consent was obtained.  The digital rectal exam revealed no rectal mass and revealed an enlarged prostate.   The Pentax Adult Colonscope [id number]  endoscope was introduced through the anus and advanced to the cecum, which was identified by both the appendix and ileocecal valve. No adverse events experienced.   The quality of the prep was good, using MiraLax  The instrument was then slowly withdrawn as the colon was fully examined.      COLON FINDINGS: There was diverticulosis noted in the sigmoid colon. The examination was otherwise normal.  Retroflexed views revealed internal hemorrhoids. The time to cecum=4 minutes seconds. Withdrawal time=8 minutes 0 seconds.  The scope was withdrawn and the procedure completed. COMPLICATIONS: There were no immediate complications.  ENDOSCOPIC IMPRESSION: 1.   Diverticulosis was noted in the sigmoid colon 2.   The examination was otherwise normal  RECOMMENDATIONS: Chnage Fibercon to [employer] See me in office if fecal seepage continues - could be from hemorrhoids  resume warfarin and get INR 1 week at anti-coag clinic - or follow instructions to hold per upcoming hernia  repair  eSigned:  Gatha Mayer, MD, [facility] 01/03/2014 11:47 AM      PATIENT NAME:  [name], [name] MR#: [id number]

## 2014-01-04 ENCOUNTER — Encounter (HOSPITAL_COMMUNITY): Payer: Self-pay | Admitting: Internal Medicine

## 2014-01-05 ENCOUNTER — Encounter (HOSPITAL_COMMUNITY): Payer: Self-pay | Admitting: Pharmacy Technician

## 2014-01-08 DIAGNOSIS — R3912 Poor urinary stream: Secondary | ICD-10-CM | POA: Diagnosis not present

## 2014-01-08 DIAGNOSIS — H35363 Drusen (degenerative) of macula, bilateral: Secondary | ICD-10-CM | POA: Diagnosis not present

## 2014-01-08 DIAGNOSIS — N401 Enlarged prostate with lower urinary tract symptoms: Secondary | ICD-10-CM | POA: Diagnosis not present

## 2014-01-08 DIAGNOSIS — H524 Presbyopia: Secondary | ICD-10-CM | POA: Diagnosis not present

## 2014-01-09 ENCOUNTER — Ambulatory Visit (INDEPENDENT_AMBULATORY_CARE_PROVIDER_SITE_OTHER): Payer: Medicare Other | Admitting: *Deleted

## 2014-01-09 DIAGNOSIS — I4891 Unspecified atrial fibrillation: Secondary | ICD-10-CM

## 2014-01-09 LAB — POCT INR: INR: 1.5

## 2014-01-11 ENCOUNTER — Other Ambulatory Visit (HOSPITAL_COMMUNITY): Payer: Medicare Other

## 2014-01-17 ENCOUNTER — Encounter (HOSPITAL_COMMUNITY): Admission: RE | Payer: Self-pay | Source: Ambulatory Visit

## 2014-01-17 ENCOUNTER — Ambulatory Visit (HOSPITAL_COMMUNITY): Admission: RE | Admit: 2014-01-17 | Payer: Medicare Other | Source: Ambulatory Visit | Admitting: Surgery

## 2014-01-17 SURGERY — REPAIR, HERNIA, INGUINAL, ADULT
Anesthesia: General | Laterality: Right

## 2014-01-18 ENCOUNTER — Ambulatory Visit (INDEPENDENT_AMBULATORY_CARE_PROVIDER_SITE_OTHER): Payer: Medicare Other | Admitting: Pharmacist

## 2014-01-18 DIAGNOSIS — I4891 Unspecified atrial fibrillation: Secondary | ICD-10-CM

## 2014-01-18 LAB — POCT INR: INR: 3

## 2014-01-29 ENCOUNTER — Telehealth: Payer: Self-pay | Admitting: Cardiology

## 2014-01-29 NOTE — Telephone Encounter (Signed)
New  Message     MD calling in reference to holding coumadin due to  Pending  dental work

## 2014-01-30 NOTE — Telephone Encounter (Signed)
Telephoned Dr Romie Minus back he wants pt 2.5 or< for upcoming extraction. Instructed him to schedule pt for Thursday or Friday appt so we can see him on a Monday and adjust accordingly.

## 2014-02-07 ENCOUNTER — Telehealth: Payer: Self-pay | Admitting: Cardiology

## 2014-02-07 NOTE — Telephone Encounter (Signed)
New message     Pt needs to have a tooth extracted on 02-14-14.  Will he need to hold his coumadin prior to extraction?

## 2014-02-07 NOTE — Telephone Encounter (Signed)
Spoke with pt and I scheduled his Coumadin appt for 12/79/15 for INR check so we can adjust for 2.5 or < for extraction on 02/14/14 .

## 2014-02-08 ENCOUNTER — Encounter: Payer: Medicare Other | Admitting: *Deleted

## 2014-02-09 ENCOUNTER — Telehealth: Payer: Self-pay | Admitting: Cardiology

## 2014-02-09 NOTE — Telephone Encounter (Signed)
LMOVM reminding pt to send remote transmission.   

## 2014-02-12 ENCOUNTER — Ambulatory Visit (INDEPENDENT_AMBULATORY_CARE_PROVIDER_SITE_OTHER): Payer: Medicare Other | Admitting: Pharmacist

## 2014-02-12 ENCOUNTER — Encounter: Payer: Self-pay | Admitting: Cardiology

## 2014-02-12 DIAGNOSIS — I4891 Unspecified atrial fibrillation: Secondary | ICD-10-CM

## 2014-02-12 LAB — POCT INR: INR: 1.6

## 2014-02-14 ENCOUNTER — Ambulatory Visit (INDEPENDENT_AMBULATORY_CARE_PROVIDER_SITE_OTHER): Payer: Medicare Other | Admitting: Family Medicine

## 2014-02-14 ENCOUNTER — Encounter: Payer: Self-pay | Admitting: Family Medicine

## 2014-02-14 VITALS — BP 110/70 | HR 72 | Temp 97.8°F | Wt 163.0 lb

## 2014-02-14 DIAGNOSIS — I480 Paroxysmal atrial fibrillation: Secondary | ICD-10-CM | POA: Diagnosis not present

## 2014-02-14 DIAGNOSIS — J309 Allergic rhinitis, unspecified: Secondary | ICD-10-CM | POA: Insufficient documentation

## 2014-02-14 DIAGNOSIS — I5022 Chronic systolic (congestive) heart failure: Secondary | ICD-10-CM | POA: Diagnosis not present

## 2014-02-14 DIAGNOSIS — Z23 Encounter for immunization: Secondary | ICD-10-CM | POA: Diagnosis not present

## 2014-02-14 DIAGNOSIS — R05 Cough: Secondary | ICD-10-CM | POA: Diagnosis not present

## 2014-02-14 DIAGNOSIS — J302 Other seasonal allergic rhinitis: Secondary | ICD-10-CM | POA: Diagnosis not present

## 2014-02-14 DIAGNOSIS — R053 Chronic cough: Secondary | ICD-10-CM

## 2014-02-14 NOTE — Patient Instructions (Addendum)
Prevnar and tetanus shot today.   Been under 3 months since last labs so I will let you wait to see cardiology. See if they will check your cholesterol since you had the decrease of pravastatin to 20mg .   Make sure to see either me or cardiology at least every 3 months  Great to meet you! Look forward to working with you.

## 2014-02-14 NOTE — Progress Notes (Signed)
Craig Reddish, MD Phone: [phone]  Subjective:  Patient presents today to establish care with me as their new primary care provider. Patient was formerly a patient of Dr. Leanne Chang. Chief complaint-noted.   Atrial Fibrillation, paroxysmal-stable in sinus Long history of requiring cardioversion until transitioned to amiodarone. States he has been in norma Sinus rhythm on amiodarone-down to 100mg  from 200mg  previously. Goes to [facility] coumadin clinic.  ROS- denies palpitations/chest pain  Congestive heart Failure-stable Chronic cough likely from Allergic Rhinitis- mild poor control Patient with long history nonischemic cardiomyopathy as cause of CHF. He was stared on lasix due to some edema at 20mg  and edema has resolved. His weight is up a few lbs today from baseline around 157 but he states he is wearing heavier clothes and has not noted significant change at home.Working out 4-5 days for 30-45 minutes without difficulty  He has a history of post nasal drip causing coughing with lying flat for which he has seen Dr. Chase Caller.  Stops with cough drop. Just started flonase back a few days ago which was previously recommended.  ROS-No shortness of breath (or more accurately 90% of original baseline breathing after his pneumonia with continued improvement to this 90%). Not sleeping on more pillows.  The following were reviewed and entered/updated in epic: Past Medical History  Diagnosis Date  . Coronary artery disease   . Cardiomyopathy primary-nonischemic EF 45%  . Hyperlipidemia   . GERD (gastroesophageal reflux disease)   . Polymyalgia rheumatica   . Sick sinus syndrome   . Atrial fibrillation   . Dysphagia   . Hearing loss, mixed, bilateral   . Increased prostate specific antigen (PSA) velocity   . Lumbar back pain   . BPH (benign prostatic hypertrophy)   . Breast mass     "on both sides" (06/28/2012)  . CHF (congestive heart failure)   . Elevated liver enzymes   . Esophageal  stricture   . Arthritis     "some" (06/28/2012)  . Pacemaker infection     06/28/12  . Hx of cardiovascular stress test     Adenosine Myoview (07/2013):  Apical cap, apical lateral and mid anterolateral scar, small amount of peri-infarct ischemia, EF 36%; Medium Risk  . Pacemaker     st jude  . Diverticulosis of colon without hemorrhage 01/03/2014  . Internal hemorrhoids 01/03/2014   Patient Active Problem List   Diagnosis Date Noted  . Chronic systolic CHF (congestive heart failure) 03/21/2013    Priority: High  . Nonischemic cardiomyopathy 09/20/2010    Priority: High  . PPM-St.Jude 11/27/2008    Priority: High  . CAD (coronary artery disease) 07/20/2008    Priority: High  . SICK SINUS SYNDROME 07/20/2008    Priority: High  . Atrial fibrillation 03/16/2007    Priority: High  . Chronic cough 08/18/2013    Priority: Medium  . Dyspnea 08/16/2013    Priority: Medium  . Pleural effusion, right 08/16/2013    Priority: Medium  . Pleural effusion on right 07/06/2012    Priority: Medium  . Hyperlipidemia 03/16/2007    Priority: Medium  . BPH (benign prostatic hyperplasia) 03/16/2007    Priority: Medium  . Allergic rhinitis 02/14/2014    Priority: Low  . Fecal incontinence 10/18/2013    Priority: Low  . Recurrent right inguinal hernia 11/22/2012    Priority: Low  . Intention tremor 11/01/2012    Priority: Low  . Polymyalgia rheumatica 07/03/2008    Priority: Low  . MIXED HEARING LOSS  BILATERAL 01/10/2008    Priority: Low  . GERD 03/16/2007    Priority: Low   Past Surgical History  Procedure Laterality Date  . Cataract extraction w/ intraocular lens  implant, bilateral Bilateral   . Umbilical hernia repair    . Insert / replace / remove pacemaker    . Inguinal hernia repair Right 2012    RIH  . Umbilical hernia repair      around age 41-3x  . Cardioversion  06/17/2011    Procedure: CARDIOVERSION;  Surgeon: Lelon Perla, MD;  Location: [place];  Service:  Cardiovascular;  Laterality: N/A;  . Cardioversion  09/09/2011    Procedure: CARDIOVERSION;  Surgeon: Carlena Bjornstad, MD;  Location: [place];  Service: Cardiovascular;  Laterality: N/A;  . Cardioversion  01/01/2012    Procedure: CARDIOVERSION;  Surgeon: Hillary Bow, MD;  Location: [facility] ENDOSCOPY;  Service: Cardiovascular;  Laterality: N/A;  . Tee without cardioversion  01/01/2012    Procedure: TRANSESOPHAGEAL ECHOCARDIOGRAM (TEE);  Surgeon: Fay Records, MD;  Location: [place];  Service: Cardiovascular;  Laterality: N/A;  . Eye surgery    . Generator removal Left 06/15/2012    Procedure: GENERATOR REMOVAL;  Surgeon: Evans Lance, MD;  Location: [place];  Service: Cardiovascular;  Laterality: Left;  . Pacemaker generator change N/A 06/15/2012    Procedure: PACEMAKER GENERATOR CHANGE;  Surgeon: Evans Lance, MD;  Location: [place];  Service: Cardiovascular;  Laterality: N/A;  . Tonsillectomy and adenoidectomy    . Cardiac catheterization    . Esophagogastroduodenoscopy    . Colonoscopy    . Colonoscopy N/A 01/03/2014    Procedure: COLONOSCOPY;  Surgeon: Gatha Mayer, MD;  Location: [facility] ENDOSCOPY;  Service: Endoscopy;  Laterality: N/A;    Family History  Problem Relation Age of Onset  . Heart attack Father 4    deceased  . AAA (abdominal aortic aneurysm) Mother     deceased AAA  . Hypertension Mother   . Other Brother     deceased at birth    Medications- reviewed and updated Current Outpatient Prescriptions  Medication Sig Dispense Refill  . amiodarone (PACERONE) 200 MG tablet Take 100 mg by mouth daily.    . carvedilol (COREG) 6.25 MG tablet Take 6.25 mg by mouth 2 (two) times daily with a meal.     . furosemide (LASIX) 20 MG tablet Take 1 tablet (20 mg total) by mouth daily. 90 tablet 3  . lisinopril (PRINIVIL,ZESTRIL) 2.5 MG tablet Take 1 tablet (2.5 mg total) by mouth daily. 90 tablet 3  . potassium chloride SA (K-DUR,KLOR-CON) 20 MEQ tablet Take 20 mEq by mouth daily.    .  pravastatin (PRAVACHOL) 40 MG tablet Take 40 mg by mouth daily.     . tamsulosin (FLOMAX) 0.4 MG CAPS Take 0.4 mg by mouth daily.     Marland Kitchen warfarin (COUMADIN) 5 MG tablet Take 5 mg by mouth daily. Take 5 mg by mouth on Tuesday, Thursday and Sunday. Take 2.5 mg by mouth on all other days    . Wheat Dextrin (BENEFIBER DRINK MIX PO) Take 1 each by mouth daily. Mix 1 tablespoon in liquid and drink once daily.    . fluticasone (FLONASE) 50 MCG/ACT nasal spray Place 2 sprays into both nostrils daily as needed for allergies or rhinitis.     No current facility-administered medications for this visit.    Allergies-reviewed and updated Allergies  Allergen Reactions  . Antihistamines, Diphenhydramine-Type Other (See Comments)  Causes difficulty in ability to urinate.    History   Social History  . Marital Status: Married    Spouse Name: N/A    Number of Children: 3  . Years of Education: N/A   Occupational History  . retired     Engineering geologist   Social History Main Topics  . Smoking status: Former Smoker -- 1.00 packs/day for 40 years    Types: Cigarettes    Quit date: 03/10/1983  . Smokeless tobacco: Never Used  . 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 Drug Use: No  . Sexual Activity: Not on file   Other Topics Concern  . Not on file   Social History Narrative   Married. 3 children (6 together). 5 grandkids with with 5 grandkids (2nd marriage). No greatgrandkids.       Retired from [employer]      Hobbies: play golf, bridge, garden, write family stories    ROS--See HPI   Objective: BP 110/70 mmHg  Pulse 72  Temp(Src) 97.8 F (36.6 C)  Wt 163 lb (73.936 kg) Gen: NAD, resting comfortably HEENT: Mucous membranes are moist. Oropharynx normal Right eye with some erythema at the lower lid but no changes to sclera (stye-advised warm compresses) CV: RRR no murmurs rubs or gallops. Over about a  minute-1 ectopic beat.  Pacemaker noted left upper chest Lungs: CTAB no crackles, wheeze, rhonchi Abdomen: soft/nontender/nondistended/normal bowel sounds.  Ext: no edema (on lasix) Skin: warm, dry, no rash except around eye Neuro: grossly normal, moves all extremities, PERRLA   Assessment/Plan:  Atrial fibrillation Patient appears to be in sinus rhythm on amiodarone 100mg . Also with carvedilol on board. Anticoagulated through cardiology coumadin clinic and has done well.   Chronic systolic CHF (congestive heart failure) Stable. Weight up a few lbs from dry weight of 157 (but could be heavier clothes) and no increasing edema or shortness of breath. Continue lasix 20mg  daily.   Chronic cough I suspect this is post nasal drip as worse with laying down. No orthopnea to suggest CHF related. Advised patient to regularly take his flonase. If no relief with this, could consider change to ARB from lisinopril as per Dr. Golden Pop thoughts previously.   also had a stye-advised warm compresses  Return precautions advised. See either [place] or cards every 3 months-at next visit update labs including lipids. At least q6 months on amiodarone.   Orders Placed This Encounter  Procedures  . Tetanus vaccine IM  . Pneumococcal conjugate vaccine 13-valent

## 2014-02-14 NOTE — Assessment & Plan Note (Signed)
Stable. Weight up a few lbs from dry weight of 157 (but could be heavier clothes) and no increasing edema or shortness of breath. Continue lasix 20mg  daily.

## 2014-02-14 NOTE — Assessment & Plan Note (Signed)
Patient appears to be in sinus rhythm on amiodarone 100mg . Also with carvedilol on board. Anticoagulated through cardiology coumadin clinic and has done well.

## 2014-02-14 NOTE — Assessment & Plan Note (Signed)
I suspect this is post nasal drip as worse with laying down. No orthopnea to suggest CHF related. Advised patient to regularly take his flonase. If no relief with this, could consider change to ARB from lisinopril as per Dr. Golden Pop thoughts previously.

## 2014-02-15 ENCOUNTER — Encounter (HOSPITAL_COMMUNITY): Payer: Self-pay | Admitting: Internal Medicine

## 2014-02-17 ENCOUNTER — Emergency Department (INDEPENDENT_AMBULATORY_CARE_PROVIDER_SITE_OTHER)
Admission: EM | Admit: 2014-02-17 | Discharge: 2014-02-17 | Disposition: A | Discharge status: Home / Self Care | Payer: Medicare Other | Source: Home / Self Care | Attending: Family Medicine | Admitting: Family Medicine

## 2014-02-17 ENCOUNTER — Encounter (HOSPITAL_COMMUNITY): Payer: Self-pay | Admitting: Emergency Medicine

## 2014-02-17 DIAGNOSIS — A499 Bacterial infection, unspecified: Secondary | ICD-10-CM

## 2014-02-17 DIAGNOSIS — H00023 Hordeolum internum right eye, unspecified eyelid: Secondary | ICD-10-CM

## 2014-02-17 DIAGNOSIS — H109 Unspecified conjunctivitis: Secondary | ICD-10-CM

## 2014-02-17 DIAGNOSIS — H1089 Other conjunctivitis: Secondary | ICD-10-CM

## 2014-02-17 MED ORDER — ERYTHROMYCIN 5 MG/GM OP OINT: TOPICAL_OINTMENT | OPHTHALMIC | Status: DC

## 2014-02-17 NOTE — ED Provider Notes (Signed)
CSN: [id number]     Arrival date & time 02/17/14  1042 History   First MD Initiated Contact with Patient 02/17/14 1116     Chief Complaint  Patient presents with  . Stye   (Consider location/radiation/quality/duration/timing/severity/associated sxs/prior Treatment) HPI Comments: 80 year old man developed soreness to the outer canthus of the right eye approximately one week ago. Soreness had increased along with redness of the eye over the following couple days. Day before yesterday states he was getting better with warm compresses. Yesterday there was a significant increase in soreness to the right outer canthus, redness to the eye and purulent drainage.   Past Medical History  Diagnosis Date  . Coronary artery disease   . Cardiomyopathy primary-nonischemic EF 45%  . Hyperlipidemia   . GERD (gastroesophageal reflux disease)   . Polymyalgia rheumatica   . Sick sinus syndrome   . Atrial fibrillation   . Dysphagia   . Hearing loss, mixed, bilateral   . Increased prostate specific antigen (PSA) velocity   . Lumbar back pain   . BPH (benign prostatic hypertrophy)   . Breast mass     "on both sides" (06/28/2012)  . CHF (congestive heart failure)   . Elevated liver enzymes   . Esophageal stricture   . Arthritis     "some" (06/28/2012)  . Pacemaker infection     06/28/12  . Hx of cardiovascular stress test     Adenosine Myoview (07/2013):  Apical cap, apical lateral and mid anterolateral scar, small amount of peri-infarct ischemia, EF 36%; Medium Risk  . Pacemaker     st jude  . Diverticulosis of colon without hemorrhage 01/03/2014  . Internal hemorrhoids 01/03/2014   Past Surgical History  Procedure Laterality Date  . Cataract extraction w/ intraocular lens  implant, bilateral Bilateral   . Umbilical hernia repair    . Insert / replace / remove pacemaker    . Inguinal hernia repair Right 2012    RIH  . Umbilical hernia repair      around age 78-3x  . Cardioversion  06/17/2011     Procedure: CARDIOVERSION;  Surgeon: Lelon Perla, MD;  Location: [place];  Service: Cardiovascular;  Laterality: N/A;  . Cardioversion  09/09/2011    Procedure: CARDIOVERSION;  Surgeon: Carlena Bjornstad, MD;  Location: [place];  Service: Cardiovascular;  Laterality: N/A;  . Cardioversion  01/01/2012    Procedure: CARDIOVERSION;  Surgeon: Hillary Bow, MD;  Location: [facility] ENDOSCOPY;  Service: Cardiovascular;  Laterality: N/A;  . Tee without cardioversion  01/01/2012    Procedure: TRANSESOPHAGEAL ECHOCARDIOGRAM (TEE);  Surgeon: Fay Records, MD;  Location: [place];  Service: Cardiovascular;  Laterality: N/A;  . Eye surgery    . Generator removal Left 06/15/2012    Procedure: GENERATOR REMOVAL;  Surgeon: Evans Lance, MD;  Location: [place];  Service: Cardiovascular;  Laterality: Left;  . Pacemaker generator change N/A 06/15/2012    Procedure: PACEMAKER GENERATOR CHANGE;  Surgeon: Evans Lance, MD;  Location: [place];  Service: Cardiovascular;  Laterality: N/A;  . Tonsillectomy and adenoidectomy    . Cardiac catheterization    . Esophagogastroduodenoscopy    . Colonoscopy    . Colonoscopy N/A 01/03/2014    Procedure: COLONOSCOPY;  Surgeon: Gatha Mayer, MD;  Location: [facility] ENDOSCOPY;  Service: Endoscopy;  Laterality: N/A;  . Permanent pacemaker insertion N/A 06/28/2012    Procedure: PERMANENT PACEMAKER INSERTION;  Surgeon: Evans Lance, MD;  Location: [facility] CATH LAB;  Service: Cardiovascular;  Laterality: N/A;  . Lead revision N/A 06/29/2012    Procedure: LEAD REVISION;  Surgeon: Deboraha Sprang, MD;  Location: [facility] CATH LAB;  Service: Cardiovascular;  Laterality: N/A;  . Pacemaker revision  06/30/2012    Procedure: PACEMAKER LEAD REVISION;  Surgeon: Evans Lance, MD;  Location: [facility] CATH LAB;  Service: Cardiovascular;;   Family History  Problem Relation Age of Onset  . Heart attack Father 75    deceased  . AAA (abdominal aortic aneurysm) Mother     deceased AAA  . Hypertension Mother   .  Other Brother     deceased at birth   History  Substance Use Topics  . Smoking status: Former Smoker -- 1.00 packs/day for 40 years    Types: Cigarettes    Quit date: 03/10/1983  . Smokeless tobacco: Never Used  . 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  Negative for fever.  HENT: Negative.   Eyes: Positive for discharge, redness and itching. Negative for photophobia and visual disturbance.    Allergies  Antihistamines, diphenhydramine-type  Home Medications   Prior to Admission medications   Medication Sig Start Date End Date Taking? Authorizing Provider  amiodarone (PACERONE) 200 MG tablet Take 100 mg by mouth daily. 09/05/13  Yes Larey Dresser, MD  carvedilol (COREG) 6.25 MG tablet Take 6.25 mg by mouth 2 (two) times daily with a meal.  07/28/13  Yes Larey Dresser, MD  furosemide (LASIX) 20 MG tablet Take 1 tablet (20 mg total) by mouth daily. 08/14/13  Yes Bruce Kendall Flack, MD  potassium chloride SA (K-DUR,KLOR-CON) 20 MEQ tablet Take 20 mEq by mouth daily.   Yes Historical Provider, MD  pravastatin (PRAVACHOL) 40 MG tablet Take 40 mg by mouth daily.    Yes Historical Provider, MD  tamsulosin (FLOMAX) 0.4 MG CAPS Take 0.4 mg by mouth daily.  08/16/12  Yes Historical Provider, MD  warfarin (COUMADIN) 5 MG tablet Take 5 mg by mouth daily. Take 5 mg by mouth on Tuesday, Thursday and Sunday. Take 2.5 mg by mouth on all other days   Yes Historical Provider, MD  erythromycin ophthalmic ointment Place a 1/2 inch ribbon of ointment into the left lower eyelid qid 02/17/14   Janne Napoleon, NP  fluticasone [facility]) 50 MCG/ACT nasal spray Place 2 sprays into both nostrils daily as needed for allergies or rhinitis.    Historical Provider, MD  lisinopril (PRINIVIL,ZESTRIL) 2.5 MG tablet Take 1 tablet (2.5 mg total) by mouth daily. 02/07/13   Larey Dresser, MD  Wheat Dextrin (BENEFIBER DRINK MIX PO) Take 1  each by mouth daily. Mix 1 tablespoon in liquid and drink once daily.    Historical Provider, MD   BP 99/65 mmHg  Pulse 75  Temp(Src) 97.3 F (36.3 C) (Oral)  Resp 18  SpO2 95% Physical Exam  Constitutional: He is oriented to person, place, and time. He appears well-developed and well-nourished. No distress.  Eyes: EOM are normal. Pupils are equal, round, and reactive to light.  There is a internal hordeolum to the right lower lid at the outer canthus. The conjunctivae of the swollen and erythematous. There is a scant amount of mucoid and purulent type drainage. There is mild cutaneous erythema below the right eyelid. This does not appear to be a cellulitis. The anterior chamber is clear.  Neck: Normal range of motion. Neck supple.  Pulmonary/Chest: Effort normal.  No respiratory distress.  Neurological: He is alert and oriented to person, place, and time.  Skin: Skin is warm and dry.  Psychiatric: He has a normal mood and affect.  Nursing note and vitals reviewed.   ED Course  Procedures (including critical care time) Labs Review Labs Reviewed - No data to display  Imaging Review No results found.   MDM   1. Hordeolum internum of right eye   2. Bacterial conjunctivitis of right eye    Erythromycin op oint qid Warm compresses several times a day Return prn    Janne Napoleon, NP 02/17/14 1138

## 2014-02-17 NOTE — Discharge Instructions (Signed)
Conjunctivitis Conjunctivitis is commonly called "pink eye." Conjunctivitis can be caused by bacterial or viral infection, allergies, or injuries. There is usually redness of the lining of the eye, itching, discomfort, and sometimes discharge. There may be deposits of matter along the eyelids. A viral infection usually causes a watery discharge, while a bacterial infection causes a yellowish, thick discharge. Pink eye is very contagious and spreads by direct contact. You may be given antibiotic eyedrops as part of your treatment. Before using your eye medicine, remove all drainage from the eye by washing gently with warm water and cotton balls. Continue to use the medication until you have awakened 2 mornings in a row without discharge from the eye. Do not rub your eye. This increases the irritation and helps spread infection. Use separate towels from other household members. Wash your hands with soap and water before and after touching your eyes. Use cold compresses to reduce pain and sunglasses to relieve irritation from light. Do not wear contact lenses or wear eye makeup until the infection is gone. SEEK MEDICAL CARE IF:   Your symptoms are not better after 3 days of treatment.  You have increased pain or trouble seeing.  The outer eyelids become very red or swollen. Document Released: 04/02/2004 Document Revised: 05/18/2011 Document Reviewed: 02/23/2005 [facility] Patient Information 2015 [place], [place]. This information is not intended to replace advice given to you by your health care provider. Make sure you discuss any questions you have with your health care provider.  Sty A sty (hordeolum) is an infection of a gland in the eyelid located at the base of the eyelash. A sty may develop a white or yellow head of pus. It can be puffy (swollen). Usually, the sty will burst and pus will come out on its own. They do not leave lumps in the eyelid once they drain. A sty is often confused with another  form of cyst of the eyelid called a chalazion. Chalazions occur within the eyelid and not on the edge where the bases of the eyelashes are. They often are red, sore and then form firm lumps in the eyelid. CAUSES   Germs (bacteria).  Lasting (chronic) eyelid inflammation. SYMPTOMS   Tenderness, redness and swelling along the edge of the eyelid at the base of the eyelashes.  Sometimes, there is a white or yellow head of pus. It may or may not drain. DIAGNOSIS  An ophthalmologist will be able to distinguish between a sty and a chalazion and treat the condition appropriately.  TREATMENT   Styes are typically treated with warm packs (compresses) until drainage occurs.  In rare cases, medicines that kill germs (antibiotics) may be prescribed. These antibiotics may be in the form of drops, cream or pills.  If a hard lump has formed, it is generally necessary to do a small incision and remove the hardened contents of the cyst in a minor surgical procedure done in the office.  In suspicious cases, your caregiver may send the contents of the cyst to the lab to be certain that it is not a rare, but dangerous form of cancer of the glands of the eyelid. HOME CARE INSTRUCTIONS   Wash your hands often and dry them with a clean towel. Avoid touching your eyelid. This may spread the infection to other parts of the eye.  Apply heat to your eyelid for 10 to 20 minutes, several times a day, to ease pain and help to heal it faster.  Do not squeeze the sty.  Allow it to drain on its own. Wash your eyelid carefully 3 to 4 times per day to remove any pus. SEEK IMMEDIATE MEDICAL CARE IF:   Your eye becomes painful or puffy (swollen).  Your vision changes.  Your sty does not drain by itself within 3 days.  Your sty comes back within a short period of time, even with treatment.  You have redness (inflammation) around the eye.  You have a fever. Document Released: 12/03/2004 Document Revised: 05/18/2011  Document Reviewed: 06/09/2013 [facility] Patient Information 2015 [place], [place]. This information is not intended to replace advice given to you by your health care provider. Make sure you discuss any questions you have with your health care provider.

## 2014-02-17 NOTE — ED Notes (Signed)
Pt reports stye to lower eye lid on right eye onset x 1 week Sx include pain, redness, watery Denies inj/trauma, fevers, chills Alert, no signs of acute distress

## 2014-02-19 ENCOUNTER — Other Ambulatory Visit: Payer: Self-pay | Admitting: Cardiology

## 2014-02-26 ENCOUNTER — Ambulatory Visit (INDEPENDENT_AMBULATORY_CARE_PROVIDER_SITE_OTHER): Payer: Medicare Other | Admitting: Pharmacist

## 2014-02-26 DIAGNOSIS — I4891 Unspecified atrial fibrillation: Secondary | ICD-10-CM

## 2014-02-26 LAB — POCT INR: INR: 2.1

## 2014-02-26 MED ORDER — ENOXAPARIN SODIUM 100 MG/ML ~~LOC~~ SOLN: 100.0000 mg | SUBCUTANEOUS | Status: DC

## 2014-02-26 NOTE — Patient Instructions (Addendum)
03/15/14-Take your last dose of Coumadin  03/16/14-No coumadin or Lovenox  03/17/14-Lovenox 100mg  once a day 03/18/14-Lovenox 100mg  once a day 03/19/14-Lovenox 100mg  once a day 03/20/14-Procedure day- no lovenox or coumadin  03/21/14- If ok with surgeon resume coumadin 1 tablet and lovenox 100mg  03/22/14- Coumadin 1.5 tablets and Lovenox 100mg  03/23/14-Coumadin 1/2 tablet and lovenox 100mg   03/24/14-Coumadin 1/2 tablet and lovenox 100mg   03/25/14-Coumadin 1 tablet and lovenox 100mg   03/26/14- Appt with Coumadin Clinic

## 2014-03-05 ENCOUNTER — Telehealth: Payer: Self-pay

## 2014-03-05 ENCOUNTER — Telehealth: Payer: Self-pay | Admitting: Cardiology

## 2014-03-05 MED ORDER — ENOXAPARIN SODIUM 100 MG/ML ~~LOC~~ SOLN: 100.0000 mg | SUBCUTANEOUS | Status: DC

## 2014-03-05 NOTE — Telephone Encounter (Signed)
Pt called to report he is changing pharmacies in January and would like to change his prescription of Lovenox from [facility] to [facility].  Rx sent for patient.

## 2014-03-05 NOTE — Telephone Encounter (Deleted)
error 

## 2014-03-06 ENCOUNTER — Encounter: Payer: Self-pay | Admitting: *Deleted

## 2014-03-06 ENCOUNTER — Ambulatory Visit (INDEPENDENT_AMBULATORY_CARE_PROVIDER_SITE_OTHER): Payer: Self-pay | Admitting: Surgery

## 2014-03-08 NOTE — Pre-Procedure Instructions (Signed)
TYLAN KINN  03/08/2014   Your procedure is scheduled on:  Tuesday, Jan. 12th   Report to [facility] Admitting at  5:30 AM.   Call this number if you have problems the morning of surgery: [phone]   Remember:   Do not eat food or drink liquids after midnight Monday.   Take these medicines the morning of surgery with A SIP OF WATER: Amiodarone, Carvedilol,Flonase, Flomax   Do not wear jewelry - no rings or watches.  Do not wear lotions or colognes.  You may NOT wear deodorant the day of surgery.   Men may shave face and neck.   Do not bring valuables to the hospital.  [facility] is not responsible for any belongings or valuables.               Contacts, dentures or bridgework may not be worn into surgery.  Leave suitcase in the car. After surgery it may be brought to your room.               Patients discharged the day of surgery will not be allowed to drive home, and will need someone to stay with you for 24 hrs afterwards.   Name and phone number of your driver:    Special Instructions: "Preparing for Surgery" instruction sheet.   Please read over the following fact sheets that you were given: Pain Booklet and Surgical Site Infection Prevention

## 2014-03-12 ENCOUNTER — Encounter (HOSPITAL_COMMUNITY): Payer: Self-pay

## 2014-03-12 ENCOUNTER — Encounter (HOSPITAL_COMMUNITY)
Admission: RE | Admit: 2014-03-12 | Discharge: 2014-03-12 | Disposition: A | Discharge status: Home / Self Care | Payer: Medicare Other | Source: Ambulatory Visit | Attending: Surgery | Admitting: Surgery

## 2014-03-12 DIAGNOSIS — H9190 Unspecified hearing loss, unspecified ear: Secondary | ICD-10-CM | POA: Insufficient documentation

## 2014-03-12 DIAGNOSIS — J988 Other specified respiratory disorders: Secondary | ICD-10-CM | POA: Insufficient documentation

## 2014-03-12 DIAGNOSIS — Z950 Presence of cardiac pacemaker: Secondary | ICD-10-CM | POA: Diagnosis not present

## 2014-03-12 DIAGNOSIS — J90 Pleural effusion, not elsewhere classified: Secondary | ICD-10-CM | POA: Insufficient documentation

## 2014-03-12 DIAGNOSIS — K219 Gastro-esophageal reflux disease without esophagitis: Secondary | ICD-10-CM | POA: Diagnosis not present

## 2014-03-12 DIAGNOSIS — I4891 Unspecified atrial fibrillation: Secondary | ICD-10-CM | POA: Insufficient documentation

## 2014-03-12 DIAGNOSIS — I2510 Atherosclerotic heart disease of native coronary artery without angina pectoris: Secondary | ICD-10-CM | POA: Diagnosis not present

## 2014-03-12 DIAGNOSIS — N400 Enlarged prostate without lower urinary tract symptoms: Secondary | ICD-10-CM | POA: Diagnosis not present

## 2014-03-12 DIAGNOSIS — R7989 Other specified abnormal findings of blood chemistry: Secondary | ICD-10-CM | POA: Insufficient documentation

## 2014-03-12 DIAGNOSIS — M353 Polymyalgia rheumatica: Secondary | ICD-10-CM | POA: Diagnosis not present

## 2014-03-12 DIAGNOSIS — R1310 Dysphagia, unspecified: Secondary | ICD-10-CM | POA: Diagnosis not present

## 2014-03-12 DIAGNOSIS — I519 Heart disease, unspecified: Secondary | ICD-10-CM | POA: Insufficient documentation

## 2014-03-12 DIAGNOSIS — I429 Cardiomyopathy, unspecified: Secondary | ICD-10-CM | POA: Diagnosis not present

## 2014-03-12 DIAGNOSIS — Z79899 Other long term (current) drug therapy: Secondary | ICD-10-CM | POA: Diagnosis not present

## 2014-03-12 DIAGNOSIS — Z01818 Encounter for other preprocedural examination: Secondary | ICD-10-CM | POA: Diagnosis not present

## 2014-03-12 DIAGNOSIS — E785 Hyperlipidemia, unspecified: Secondary | ICD-10-CM | POA: Insufficient documentation

## 2014-03-12 DIAGNOSIS — Z7901 Long term (current) use of anticoagulants: Secondary | ICD-10-CM | POA: Diagnosis not present

## 2014-03-12 DIAGNOSIS — I495 Sick sinus syndrome: Secondary | ICD-10-CM | POA: Insufficient documentation

## 2014-03-12 DIAGNOSIS — Z87891 Personal history of nicotine dependence: Secondary | ICD-10-CM | POA: Insufficient documentation

## 2014-03-12 DIAGNOSIS — I080 Rheumatic disorders of both mitral and aortic valves: Secondary | ICD-10-CM | POA: Diagnosis not present

## 2014-03-12 DIAGNOSIS — I509 Heart failure, unspecified: Secondary | ICD-10-CM | POA: Diagnosis not present

## 2014-03-12 LAB — CBC
HEMATOCRIT: 43.4 % (ref 39.0–52.0)
Hemoglobin: 14.7 g/dL (ref 13.0–17.0)
MCH: 29.6 pg (ref 26.0–34.0)
MCHC: 33.9 g/dL (ref 30.0–36.0)
MCV: 87.5 fL (ref 78.0–100.0)
Platelets: 203 10*3/uL (ref 150–400)
RBC: 4.96 MIL/uL (ref 4.22–5.81)
RDW: 14.2 % (ref 11.5–15.5)
WBC: 5.9 10*3/uL (ref 4.0–10.5)

## 2014-03-12 LAB — BASIC METABOLIC PANEL
Anion gap: 6 (ref 5–15)
BUN: 19 mg/dL (ref 6–23)
CALCIUM: 9.1 mg/dL (ref 8.4–10.5)
CHLORIDE: 104 meq/L (ref 96–112)
CO2: 28 mmol/L (ref 19–32)
CREATININE: 1.07 mg/dL (ref 0.50–1.35)
GFR calc Af Amer: 73 mL/min — ABNORMAL LOW (ref >90)
GFR calc non Af Amer: 63 mL/min — ABNORMAL LOW (ref >90)
Glucose, Bld: 89 mg/dL (ref 70–99)
Potassium: 4.3 mmol/L (ref 3.5–5.1)
Sodium: 138 mmol/L (ref 135–145)

## 2014-03-12 NOTE — Progress Notes (Addendum)
Patient has instructions from cardio (sees Dr. Aundra Dubin. LOV in the fall) concerning stopping the coumadin.   Denies any cardiac issues at the present time.  DA

## 2014-03-14 NOTE — Progress Notes (Signed)
Error

## 2014-03-14 NOTE — Progress Notes (Signed)
Anesthesia Chart Review: Patient is a 81 year old male scheduled for right IHR on 03/20/14 by Dr. Georgette Dover.  History includes CAD, non-ischemic CM, CHF, SSS s/p [employer] CRT-P PPM (s/p generator change 06/2012 due to pocket infection complicated by large right pleural effusion s/p drainage), afib s/p cardioversions '13, HLD, former smoker, GERD, polymyalgia rheumatica, hearing loss, BPH, dysphagia, elevated LFTs (due to amiodarone). PCP is Dr. Garret Reddish.  Pulmonologist is Dr. Chase Caller. Primary cardiologist is Dr. Aundra Dubin who felt patient would be "[facility] for surgery, with cardiomyopathy probably best to bridge with Lovenox."  EP cardiologist is Dr. Lovena Le.  Meds include amiodarone, Coreg, Lovenox bridge while Coumadin on hold, pravastatin, Flomax, lisinopril, Lasix, Flonase.  11/30/13 EKG: V-paced rhythm.   07/19/13 Nuclear stress test: There is a small area of scar with mild peri-infarct ischemia affecting the apical cap, apical lateral segment, and the mid anterolateral segment. This is a small area. There is only a small amount of ischemia. The ejection fraction is computed at 36%. I think it might be higher. This is a medium risk scan because there is left ventricular dysfunction along with the other findings. However, this does not appear to represent a significant ischemia risk for the patient at this time. LV Ejection Fraction: 36%. LV Wall Motion: There is decreased motion of the septum. The patient is paced. There is global hypokinesis. It is possible that overall ejection fraction is higher than the value estimated.  08/04/13 echo: Mildly dilated LV with EF 40-45%. Basal to mid inferolateralsevere hypokinesis, basal to mid anterolateral hypokinesis.Grade 2 diastolic dysfunction. Mildly dilated RV with mildly decreased systolic function. Trivial AR. Mild to moderate mitral regurgitation.  As of 11/30/13, Dr. Aundra Dubin stated, "Given lack of chest pain, improved symptoms, and similar EF by echo compared  to prior studies, I would hold off for now on cardiac cath."   PFTs 06/27/13: FVC 2.98 (90%), FEV1 2.06 (90%), DLCO 13.59 (50%). Minimal obstructive airways disease. Insignificant response to bronchodilator. Normal lung volumes. Moderately severe diffusion defect. Conclusion: Although there is airway obstruction and a diffusion defect suggesting emphysema, the absence of overinflation is inconsistent with that diagnosis. In view of the severity of the diffusion defect, studies with exercise would be helpful to evaluation the presence of hypoxemia.    08/16/13 CXR: Persistent small residual right basilar pleural effusion decreased from prior exam. Left lung is clear. No pulmonary edema.  08/29/13 Chest CT: 1. When compared with the most recent previous chest CT of 07/12/2012 there has been significant interval decrease in volume of large loculated right pleural effusion. There is a residual pleural thickening overlying the posterior aspect of the right lower lobe. 2. Persistent loculated fluid along the major fissure between the superior segment of the right lower lobe and right upper lobe is Noted. 3. Atherosclerotic disease including multi vessel coronary artery calcifications.   Preoperative labs noted. (Last AST/ALT on 11/30/13 were WNL.) He is for PT/PTT on arrival.  Per PPM perioperative RX: He is not pacer dependent.  Magnet placed over divcie during procedure recommended.  If PT/PTT results are acceptable and otherwise no acute changes then I would anticipate that he could proceed as planned.  Ryszard Hugh [facility] Short Stay Center/Anesthesiology Phone [phone] 03/14/2014 11:12 AM

## 2014-03-19 MED ORDER — CEFAZOLIN SODIUM-DEXTROSE 2-3 GM-% IV SOLR
2.0000 g | INTRAVENOUS | Status: AC
  Administered 2014-03-20: 2 g via INTRAVENOUS
  Filled 2014-03-19: qty 50

## 2014-03-19 NOTE — H&P (Signed)
History of Present Illness [name]. Sharonda Llamas MD; 12/04/2013 12:22 PM) Patient words: eval hernia.  The patient is a 81 year old male who presents with an inguinal hernia. This is an 81 year-old male who is status post open repair of a large indirect right inguinal hernia in 2012. The patient had been doing well until recently. He and his wife moved from their home to an apartment at [employer] During this move he has to do some heavy lifting. He has developed a palpable bulge in his right groin. It remains reducible.He denies any obstructive symptoms. Over the last several months, he has resumed his regular exercise regimen. Occasionally he has to manually reduced the hernia. He has had a lot of medical issues last year including having his pacemaker replaced. This was complicated by a bloody pleural effusion. He seems to be doing much better now. He saw Dr. Benjamine Mola last week for a cardiology follow-up. He remains anticoagulated on Coumadin.   Other Problems Marjean Donna, CMA; 12/04/2013 11:54 AM) Atrial Fibrillation Back Pain Inguinal Hernia Umbilical Hernia Repair  Past Surgical History Davy Pique Bynum, CMA; 12/04/2013 11:54 AM) Cataract Surgery Bilateral. Open Inguinal Hernia Surgery Right. Tonsillectomy Ventral / Umbilical Hernia Surgery multiple  Diagnostic Studies History Marjean Donna, CMA; 12/04/2013 11:54 AM) Colonoscopy 5-10 years ago  Allergies Davy Pique Bynum, CMA; 12/04/2013 11:51 AM) Antihistamine Decongestant *COUGH/COLD/ALLERGY* Amiodarone HCl *ANTIARRHYTHMICS*  Medication History (Sonya Bynum, CMA; 12/04/2013 11:53 AM) Furosemide (20MG  Tablet, Oral daily) Active. Potassium Chloride Crys ER [facility] Tablet ER, Oral daily) Active. Carvedilol (6.25MG  Tablet, Oral daily) Active. Flonase Allergy Relief (50MCG/ACT Suspension, Nasal as needed) Active. Lisinopril (2.5MG  Tablet, Oral daily) Active. Pravastatin Sodium (40MG  Tablet, Oral daily) Active. Warfarin Sodium  (5MG  Tablet, Oral daily) Active. Flomax (0.4MG  Capsule ER 24HR, Oral daily) Active.  Social History Marjean Donna, [place]; 12/04/2013 11:54 AM) Alcohol use Remotely quit alcohol use. Illicit drug use Uses socially only. No caffeine use Tobacco use Former smoker.  Family History Marjean Donna, [place]; 12/04/2013 11:54 AM) Heart Disease Father. Respiratory Condition Mother.  Review of Systems Davy Pique Bynum CMA; 12/04/2013 11:54 AM) General Not Present- Appetite Loss, Chills, Fatigue, Fever, Night Sweats, Weight Gain and Weight Loss. Skin Not Present- Change in Wart/Mole, Dryness, Hives, Jaundice, New Lesions, Non-Healing Wounds, Rash and Ulcer. HEENT Not Present- Earache, Hearing Loss, Hoarseness, Nose Bleed, Oral Ulcers, Ringing in the Ears, Seasonal Allergies, Sinus Pain, Sore Throat, Visual Disturbances, Wears glasses/contact lenses and Yellow Eyes. Respiratory Not Present- Bloody sputum, Chronic Cough, Difficulty Breathing, Snoring and Wheezing. Breast Not Present- Breast Mass, Breast Pain, Nipple Discharge and Skin Changes. Cardiovascular Not Present- Chest Pain, Difficulty Breathing Lying Down, Leg Cramps, Palpitations, Rapid Heart Rate, Shortness of Breath and Swelling of Extremities. Gastrointestinal Present- Change in Bowel Habits. Not Present- Abdominal Pain, Bloating, Bloody Stool, Chronic diarrhea, Constipation, Difficulty Swallowing, Excessive gas, Gets full quickly at meals, Hemorrhoids, Indigestion, Nausea, Rectal Pain and Vomiting. Male Genitourinary Present- Impotence. Not Present- Blood in Urine, Change in Urinary Stream, Frequency, Nocturia, Painful Urination, Urgency and Urine Leakage. Musculoskeletal Present- Back Pain. Not Present- Joint Pain, Joint Stiffness, Muscle Pain, Muscle Weakness and Swelling of Extremities. Neurological Not Present- Decreased Memory, Fainting, Headaches, Numbness, Seizures, Tingling, Tremor, Trouble walking and Weakness. Psychiatric Not Present-  Anxiety, Bipolar, Change in Sleep Pattern, Depression, Fearful and Frequent crying. Endocrine Not Present- Cold Intolerance, Excessive Hunger, Hair Changes, Heat Intolerance, Hot flashes and New Diabetes. Hematology Not Present- Easy Bruising, Excessive bleeding, Gland problems, HIV and Persistent Infections.   Vitals (Sonya Bynum  CMA; 12/04/2013 11:59 AM) 12/04/2013 11:58 AM Weight: 156 lb Height: 66in Body Surface Area: 1.82 m Body Mass Index: 25.18 kg/m Temp.: 97.76F(Temporal)  Pulse: 68 (Regular)  BP: 128/80 (Sitting, Left Arm, Standard)    Physical Exam Rodman Key K. Kalai Baca MD; 12/04/2013 12:23 PM) The physical exam findings are as follows: Note:WDWN in NAD HEENT: EOMI, sclera anicteric Neck: No masses, no thyromegaly Lungs: CTA bilaterally; normal respiratory effort CV: Regular rate and rhythm; no murmurs Abd: +bowel sounds, soft, non-tender, no masses GU: visible palpable right inguinal bulge - partially reducible; no sign of left inguinal hernia Ext: Well-perfused; no edema Skin: Warm, dry; no sign of jaundice    Assessment & Plan Rodman Key K. Kristain Filo MD; 12/04/2013 12:23 PM) RECURRENT RIGHT INGUINAL HERNIA (550.91  K40.91) Current Plans  Schedule for Surgery Note:Right inguinal hernia repair with mesh. All risks and benefits were discussed with the patient to generally include, but not limited to: infection, bleeding, damage to surrounding structures, acute and chronic nerve pain, and recurrence. Alternatives were offered and described. All questions were answered and the patient voiced understanding of the procedure and wishes to proceed at this point with hernia repair.  Cardiac clearance prior to surgery. Hold Coumadin 5 days prior to surgery.    [name]. Georgette Dover, MD, [facility] Surgery  General/ Trauma Surgery  03/19/2014 8:52 PM

## 2014-03-20 ENCOUNTER — Encounter (HOSPITAL_COMMUNITY)
Admission: RE | Disposition: A | Discharge status: Home / Self Care | Payer: Self-pay | Source: Ambulatory Visit | Attending: Surgery

## 2014-03-20 ENCOUNTER — Observation Stay (HOSPITAL_COMMUNITY)
Admission: RE | Admit: 2014-03-20 | Discharge: 2014-03-21 | Disposition: A | Discharge status: Home / Self Care | Payer: Medicare Other | Source: Ambulatory Visit | Attending: Surgery | Admitting: Surgery

## 2014-03-20 ENCOUNTER — Encounter (HOSPITAL_COMMUNITY): Payer: Self-pay | Admitting: *Deleted

## 2014-03-20 ENCOUNTER — Ambulatory Visit (HOSPITAL_COMMUNITY): Payer: Medicare Other | Admitting: Vascular Surgery

## 2014-03-20 ENCOUNTER — Ambulatory Visit (HOSPITAL_COMMUNITY): Payer: Medicare Other | Admitting: Anesthesiology

## 2014-03-20 DIAGNOSIS — I2510 Atherosclerotic heart disease of native coronary artery without angina pectoris: Secondary | ICD-10-CM | POA: Diagnosis not present

## 2014-03-20 DIAGNOSIS — K219 Gastro-esophageal reflux disease without esophagitis: Secondary | ICD-10-CM | POA: Insufficient documentation

## 2014-03-20 DIAGNOSIS — I509 Heart failure, unspecified: Secondary | ICD-10-CM | POA: Insufficient documentation

## 2014-03-20 DIAGNOSIS — I4891 Unspecified atrial fibrillation: Secondary | ICD-10-CM | POA: Diagnosis not present

## 2014-03-20 DIAGNOSIS — M1990 Unspecified osteoarthritis, unspecified site: Secondary | ICD-10-CM | POA: Insufficient documentation

## 2014-03-20 DIAGNOSIS — Z950 Presence of cardiac pacemaker: Secondary | ICD-10-CM | POA: Insufficient documentation

## 2014-03-20 DIAGNOSIS — K4091 Unilateral inguinal hernia, without obstruction or gangrene, recurrent: Principal | ICD-10-CM | POA: Diagnosis present

## 2014-03-20 DIAGNOSIS — Z87891 Personal history of nicotine dependence: Secondary | ICD-10-CM | POA: Insufficient documentation

## 2014-03-20 DIAGNOSIS — K4090 Unilateral inguinal hernia, without obstruction or gangrene, not specified as recurrent: Secondary | ICD-10-CM | POA: Diagnosis not present

## 2014-03-20 DIAGNOSIS — G8918 Other acute postprocedural pain: Secondary | ICD-10-CM | POA: Diagnosis not present

## 2014-03-20 HISTORY — PX: INGUINAL HERNIA REPAIR: SHX194

## 2014-03-20 HISTORY — PX: INSERTION OF MESH: SHX5868

## 2014-03-20 LAB — APTT: aPTT: 38 seconds — ABNORMAL HIGH (ref 24–37)

## 2014-03-20 LAB — PROTIME-INR
INR: 1.33 (ref 0.00–1.49)
Prothrombin Time: 16.6 seconds — ABNORMAL HIGH (ref 11.6–15.2)

## 2014-03-20 SURGERY — REPAIR, HERNIA, INGUINAL, ADULT
Anesthesia: General | Site: Groin | Laterality: Right

## 2014-03-20 MED ORDER — BUPIVACAINE-EPINEPHRINE (PF) 0.5% -1:200000 IJ SOLN
INTRAMUSCULAR | Status: DC | PRN
  Administered 2014-03-20: 30 mL

## 2014-03-20 MED ORDER — FLUTICASONE PROPIONATE 50 MCG/ACT NA SUSP
2.0000 | Freq: Every day | NASAL | Status: DC
  Administered 2014-03-20 – 2014-03-21 (×2): 2 via NASAL
  Filled 2014-03-20: qty 16

## 2014-03-20 MED ORDER — MIDAZOLAM HCL 5 MG/5ML IJ SOLN
INTRAMUSCULAR | Status: DC | PRN
  Administered 2014-03-20: 1 mg via INTRAVENOUS

## 2014-03-20 MED ORDER — POTASSIUM CHLORIDE CRYS ER 20 MEQ PO TBCR
20.0000 meq | EXTENDED_RELEASE_TABLET | Freq: Every day | ORAL | Status: DC
  Administered 2014-03-21: 20 meq via ORAL
  Filled 2014-03-20: qty 1

## 2014-03-20 MED ORDER — NEOSTIGMINE METHYLSULFATE 10 MG/10ML IV SOLN
INTRAVENOUS | Status: AC
  Filled 2014-03-20: qty 1

## 2014-03-20 MED ORDER — ENOXAPARIN SODIUM 100 MG/ML ~~LOC~~ SOLN
100.0000 mg | SUBCUTANEOUS | Status: DC
  Administered 2014-03-20: 100 mg via SUBCUTANEOUS
  Filled 2014-03-20 (×2): qty 1

## 2014-03-20 MED ORDER — MIDAZOLAM HCL 2 MG/2ML IJ SOLN
INTRAMUSCULAR | Status: AC
  Filled 2014-03-20: qty 2

## 2014-03-20 MED ORDER — 0.9 % SODIUM CHLORIDE (POUR BTL) OPTIME
TOPICAL | Status: DC | PRN
  Administered 2014-03-20: 1000 mL

## 2014-03-20 MED ORDER — PROPOFOL 10 MG/ML IV BOLUS
INTRAVENOUS | Status: DC | PRN
  Administered 2014-03-20: 40 mg via INTRAVENOUS
  Administered 2014-03-20: 150 mg via INTRAVENOUS

## 2014-03-20 MED ORDER — FENTANYL CITRATE 0.05 MG/ML IJ SOLN
25.0000 ug | INTRAMUSCULAR | Status: DC | PRN
  Administered 2014-03-20 (×2): 25 ug via INTRAVENOUS

## 2014-03-20 MED ORDER — LIDOCAINE HCL (CARDIAC) 20 MG/ML IV SOLN
INTRAVENOUS | Status: AC
  Filled 2014-03-20: qty 5

## 2014-03-20 MED ORDER — ONDANSETRON HCL 4 MG/2ML IJ SOLN
4.0000 mg | Freq: Four times a day (QID) | INTRAMUSCULAR | Status: DC | PRN
Start: 2014-03-20 — End: 2014-03-21

## 2014-03-20 MED ORDER — PROMETHAZINE HCL 25 MG/ML IJ SOLN: 6.2500 mg | INTRAMUSCULAR | Status: DC | PRN

## 2014-03-20 MED ORDER — FUROSEMIDE 20 MG PO TABS
20.0000 mg | ORAL_TABLET | Freq: Every day | ORAL | Status: DC
  Administered 2014-03-21: 20 mg via ORAL
  Filled 2014-03-20 (×2): qty 1

## 2014-03-20 MED ORDER — ONDANSETRON HCL 4 MG/2ML IJ SOLN
INTRAMUSCULAR | Status: AC
  Filled 2014-03-20: qty 2

## 2014-03-20 MED ORDER — FENTANYL CITRATE 0.05 MG/ML IJ SOLN
INTRAMUSCULAR | Status: DC | PRN
  Administered 2014-03-20: 25 ug via INTRAVENOUS
  Administered 2014-03-20: 50 ug via INTRAVENOUS

## 2014-03-20 MED ORDER — BUPIVACAINE-EPINEPHRINE (PF) 0.25% -1:200000 IJ SOLN
INTRAMUSCULAR | Status: AC
  Filled 2014-03-20: qty 30

## 2014-03-20 MED ORDER — POTASSIUM CHLORIDE IN NACL 20-0.9 MEQ/L-% IV SOLN
INTRAVENOUS | Status: DC
  Administered 2014-03-20: 13:00:00 via INTRAVENOUS
  Filled 2014-03-20 (×2): qty 1000

## 2014-03-20 MED ORDER — CARVEDILOL 6.25 MG PO TABS
6.2500 mg | ORAL_TABLET | Freq: Two times a day (BID) | ORAL | Status: DC
  Administered 2014-03-20 – 2014-03-21 (×2): 6.25 mg via ORAL
  Filled 2014-03-20 (×4): qty 1

## 2014-03-20 MED ORDER — PRAVASTATIN SODIUM 40 MG PO TABS
40.0000 mg | ORAL_TABLET | Freq: Every day | ORAL | Status: DC
  Administered 2014-03-20: 40 mg via ORAL
  Filled 2014-03-20 (×2): qty 1

## 2014-03-20 MED ORDER — MORPHINE SULFATE 2 MG/ML IJ SOLN: 2.0000 mg | INTRAMUSCULAR | Status: DC | PRN

## 2014-03-20 MED ORDER — HYDROCODONE-ACETAMINOPHEN 5-325 MG PO TABS
1.0000 | ORAL_TABLET | ORAL | Status: DC | PRN
  Administered 2014-03-20: 1 via ORAL
  Administered 2014-03-20: 2 via ORAL
  Administered 2014-03-21: 1 via ORAL
  Filled 2014-03-20 (×2): qty 1

## 2014-03-20 MED ORDER — SUCCINYLCHOLINE CHLORIDE 20 MG/ML IJ SOLN
INTRAMUSCULAR | Status: DC | PRN
  Administered 2014-03-20: 120 mg via INTRAVENOUS

## 2014-03-20 MED ORDER — HYDROCODONE-ACETAMINOPHEN 5-325 MG PO TABS
ORAL_TABLET | ORAL | Status: AC
  Administered 2014-03-20: 2 via ORAL
  Filled 2014-03-20: qty 2

## 2014-03-20 MED ORDER — CHLORHEXIDINE GLUCONATE 4 % EX LIQD
1.0000 "application " | Freq: Once | CUTANEOUS | Status: DC
  Filled 2014-03-20: qty 15

## 2014-03-20 MED ORDER — FENTANYL CITRATE 0.05 MG/ML IJ SOLN
INTRAMUSCULAR | Status: AC
  Filled 2014-03-20: qty 5

## 2014-03-20 MED ORDER — PHENYLEPHRINE HCL 10 MG/ML IJ SOLN
INTRAMUSCULAR | Status: DC | PRN
  Administered 2014-03-20 (×2): 40 ug via INTRAVENOUS
  Administered 2014-03-20 (×2): 80 ug via INTRAVENOUS

## 2014-03-20 MED ORDER — BUPIVACAINE-EPINEPHRINE 0.25% -1:200000 IJ SOLN
INTRAMUSCULAR | Status: DC | PRN
  Administered 2014-03-20: 10 mL

## 2014-03-20 MED ORDER — ROCURONIUM BROMIDE 50 MG/5ML IV SOLN
INTRAVENOUS | Status: AC
  Filled 2014-03-20: qty 1

## 2014-03-20 MED ORDER — CEFAZOLIN SODIUM 1-5 GM-% IV SOLN
1.0000 g | Freq: Four times a day (QID) | INTRAVENOUS | Status: AC
  Administered 2014-03-20 – 2014-03-21 (×3): 1 g via INTRAVENOUS
  Filled 2014-03-20 (×3): qty 50

## 2014-03-20 MED ORDER — LIDOCAINE HCL (CARDIAC) 20 MG/ML IV SOLN
INTRAVENOUS | Status: DC | PRN
  Administered 2014-03-20: 60 mg via INTRAVENOUS

## 2014-03-20 MED ORDER — GLYCOPYRROLATE 0.2 MG/ML IJ SOLN
INTRAMUSCULAR | Status: AC
  Filled 2014-03-20: qty 3

## 2014-03-20 MED ORDER — EPHEDRINE SULFATE 50 MG/ML IJ SOLN
INTRAMUSCULAR | Status: DC | PRN
  Administered 2014-03-20: 10 mg via INTRAVENOUS
  Administered 2014-03-20 (×3): 5 mg via INTRAVENOUS

## 2014-03-20 MED ORDER — MENTHOL 3 MG MT LOZG
1.0000 | LOZENGE | OROMUCOSAL | Status: DC | PRN
  Administered 2014-03-20: 3 mg via ORAL
  Filled 2014-03-20: qty 9

## 2014-03-20 MED ORDER — TAMSULOSIN HCL 0.4 MG PO CAPS
0.4000 mg | ORAL_CAPSULE | Freq: Every day | ORAL | Status: DC
  Administered 2014-03-21: 0.4 mg via ORAL
  Filled 2014-03-20: qty 1

## 2014-03-20 MED ORDER — PROPOFOL 10 MG/ML IV BOLUS
INTRAVENOUS | Status: AC
  Filled 2014-03-20: qty 20

## 2014-03-20 MED ORDER — AMIODARONE HCL 100 MG PO TABS
100.0000 mg | ORAL_TABLET | Freq: Every day | ORAL | Status: DC
  Administered 2014-03-21: 100 mg via ORAL
  Filled 2014-03-20: qty 1

## 2014-03-20 MED ORDER — LACTATED RINGERS IV SOLN
INTRAVENOUS | Status: DC | PRN
  Administered 2014-03-20: 07:00:00 via INTRAVENOUS

## 2014-03-20 MED ORDER — LISINOPRIL 2.5 MG PO TABS
2.5000 mg | ORAL_TABLET | Freq: Every day | ORAL | Status: DC
  Administered 2014-03-20 – 2014-03-21 (×2): 2.5 mg via ORAL
  Filled 2014-03-20 (×2): qty 1

## 2014-03-20 MED ORDER — SUCCINYLCHOLINE CHLORIDE 20 MG/ML IJ SOLN
INTRAMUSCULAR | Status: AC
  Filled 2014-03-20: qty 1

## 2014-03-20 MED ORDER — LIDOCAINE HCL 4 % MT SOLN
OROMUCOSAL | Status: DC | PRN
  Administered 2014-03-20: 3 mL via TOPICAL

## 2014-03-20 MED ORDER — ONDANSETRON HCL 4 MG PO TABS: 4.0000 mg | ORAL_TABLET | Freq: Four times a day (QID) | ORAL | Status: DC | PRN

## 2014-03-20 MED ORDER — FENTANYL CITRATE 0.05 MG/ML IJ SOLN
INTRAMUSCULAR | Status: AC
  Administered 2014-03-20: 25 ug via INTRAVENOUS
  Filled 2014-03-20: qty 2

## 2014-03-20 MED ORDER — ONDANSETRON HCL 4 MG/2ML IJ SOLN
INTRAMUSCULAR | Status: DC | PRN
Start: 2014-03-20 — End: 2014-03-20
  Administered 2014-03-20: 4 mg via INTRAVENOUS

## 2014-03-20 SURGICAL SUPPLY — 51 items
APL SKNCLS STERI-STRIP NONHPOA (GAUZE/BANDAGES/DRESSINGS) ×1
BENZOIN TINCTURE PRP APPL 2/3 (GAUZE/BANDAGES/DRESSINGS) ×3 IMPLANT
BLADE SURG 15 STRL LF DISP TIS (BLADE) ×1 IMPLANT
BLADE SURG 15 STRL SS (BLADE) ×3
BLADE SURG ROTATE 9660 (MISCELLANEOUS) ×2 IMPLANT
CHLORAPREP W/TINT 26ML (MISCELLANEOUS) ×3 IMPLANT
CLOSURE WOUND 1/2 X4 (GAUZE/BANDAGES/DRESSINGS) ×1
COVER SURGICAL LIGHT HANDLE (MISCELLANEOUS) ×3 IMPLANT
DRAIN PENROSE 1/2X12 LTX STRL (WOUND CARE) ×2 IMPLANT
DRAPE LAPAROSCOPIC ABDOMINAL (DRAPES) IMPLANT
DRAPE LAPAROTOMY TRNSV 102X78 (DRAPE) IMPLANT
DRAPE UTILITY XL STRL (DRAPES) ×6 IMPLANT
DRSG TEGADERM 4X4.75 (GAUZE/BANDAGES/DRESSINGS) ×3 IMPLANT
ELECT CAUTERY BLADE 6.4 (BLADE) ×3 IMPLANT
ELECT REM PT RETURN 9FT ADLT (ELECTROSURGICAL) ×3
ELECTRODE REM PT RTRN 9FT ADLT (ELECTROSURGICAL) ×1 IMPLANT
GAUZE SPONGE 4X4 12PLY STRL (GAUZE/BANDAGES/DRESSINGS) ×3 IMPLANT
GAUZE SPONGE 4X4 16PLY XRAY LF (GAUZE/BANDAGES/DRESSINGS) ×3 IMPLANT
GLOVE BIO SURGEON STRL SZ 6 (GLOVE) ×2 IMPLANT
GLOVE BIO SURGEON STRL SZ7 (GLOVE) ×3 IMPLANT
GLOVE BIOGEL PI IND STRL 7.5 (GLOVE) ×1 IMPLANT
GLOVE BIOGEL PI INDICATOR 7.5 (GLOVE) ×4
GLOVE ECLIPSE 7.5 STRL STRAW (GLOVE) ×2 IMPLANT
GOWN STRL REUS W/ TWL LRG LVL3 (GOWN DISPOSABLE) ×2 IMPLANT
GOWN STRL REUS W/TWL LRG LVL3 (GOWN DISPOSABLE) ×6
KIT BASIN OR (CUSTOM PROCEDURE TRAY) ×3 IMPLANT
KIT ROOM TURNOVER OR (KITS) ×3 IMPLANT
MESH ULTRAPRO 3X6 7.6X15CM (Mesh General) ×2 IMPLANT
NDL HYPO 25GX1X1/2 BEV (NEEDLE) ×1 IMPLANT
NEEDLE HYPO 25GX1X1/2 BEV (NEEDLE) ×3 IMPLANT
NS IRRIG 1000ML POUR BTL (IV SOLUTION) ×3 IMPLANT
PACK SURGICAL SETUP 50X90 (CUSTOM PROCEDURE TRAY) ×3 IMPLANT
PAD ARMBOARD 7.5X6 YLW CONV (MISCELLANEOUS) ×3 IMPLANT
PENCIL BUTTON HOLSTER BLD 10FT (ELECTRODE) ×3 IMPLANT
SPONGE GAUZE 4X4 12PLY STER LF (GAUZE/BANDAGES/DRESSINGS) ×2 IMPLANT
SPONGE INTESTINAL PEANUT (DISPOSABLE) ×3 IMPLANT
STRIP CLOSURE SKIN 1/2X4 (GAUZE/BANDAGES/DRESSINGS) ×2 IMPLANT
SUT MNCRL AB 4-0 PS2 18 (SUTURE) ×3 IMPLANT
SUT PDS AB 0 CT 36 (SUTURE) IMPLANT
SUT PROLENE 2 0 CT2 30 (SUTURE) ×4 IMPLANT
SUT SILK 2 0 SH (SUTURE) IMPLANT
SUT SILK 3 0 (SUTURE)
SUT SILK 3-0 18XBRD TIE 12 (SUTURE) IMPLANT
SUT VIC AB 0 CT2 27 (SUTURE) ×3 IMPLANT
SUT VIC AB 2-0 SH 27 (SUTURE) ×3
SUT VIC AB 2-0 SH 27X BRD (SUTURE) ×1 IMPLANT
SUT VIC AB 3-0 SH 27 (SUTURE) ×3
SUT VIC AB 3-0 SH 27XBRD (SUTURE) ×1 IMPLANT
SYR CONTROL 10ML LL (SYRINGE) ×3 IMPLANT
TOWEL OR 17X24 6PK STRL BLUE (TOWEL DISPOSABLE) ×1 IMPLANT
TOWEL OR 17X26 10 PK STRL BLUE (TOWEL DISPOSABLE) ×3 IMPLANT

## 2014-03-20 NOTE — Anesthesia Preprocedure Evaluation (Addendum)
Anesthesia Evaluation  Patient identified by MRN, date of birth, ID band Patient awake    Reviewed: Allergy & Precautions, Patient's Chart, lab work & pertinent test results  History of Anesthesia Complications Negative for: history of anesthetic complications  Airway Mallampati: II   Neck ROM: Full    Dental  (+) Teeth Intact, Dental Advisory Given   Pulmonary shortness of breath, former smoker,  breath sounds clear to auscultation        Cardiovascular + CAD and +CHF + pacemaker Rhythm:Regular Rate:Normal     Neuro/Psych    GI/Hepatic GERD-  ,  Endo/Other    Renal/GU      Musculoskeletal  (+) Arthritis -,   Abdominal   Peds  Hematology   Anesthesia Other Findings   Reproductive/Obstetrics                            Anesthesia Physical Anesthesia Plan  ASA: III  Anesthesia Plan: General   Post-op Pain Management:    Induction: Intravenous  Airway Management Planned: LMA  Additional Equipment:   Intra-op Plan:   Post-operative Plan: Extubation in OR  Informed Consent: I have reviewed the patients History and Physical, chart, labs and discussed the procedure including the risks, benefits and alternatives for the proposed anesthesia with the patient or authorized representative who has indicated his/her understanding and acceptance.   Dental advisory given  Plan Discussed with: CRNA and Surgeon  Anesthesia Plan Comments:         Anesthesia Quick Evaluation

## 2014-03-20 NOTE — Op Note (Signed)
Hernia, Open, Procedure Note  Indications: The patient presented with a history of a right, reducible recurrent inguinal hernia.    Pre-operative Diagnosis: right reducible inguinal hernia Post-operative Diagnosis: same  Surgeon: Maia Petties.   Assistants: none  Anesthesia: General endotracheal anesthesia/ TAP block  ASA Class: 2  Procedure Details  The patient was seen again in the Holding Room. The risks, benefits, complications, treatment options, and expected outcomes were discussed with the patient. The possibilities of reaction to medication, pulmonary aspiration, perforation of viscus, bleeding, recurrent infection, the need for additional procedures, and development of a complication requiring transfusion or further operation were discussed with the patient and/or family. The likelihood of success in repairing the hernia and returning the patient to their previous functional status is good.  There was concurrence with the proposed plan, and informed consent was obtained. The site of surgery was properly noted/marked. The patient was taken to the Operating Room, identified as [name], and the procedure verified as right inguinal hernia repair. A Time Out was held and the above information confirmed.  The patient was placed in the supine position and underwent induction of anesthesia. The lower abdomen and groin was prepped with Chloraprep and draped in the standard fashion, and 0.25% Marcaine with epinephrine was used to anesthetize the skin over the mid-portion of the inguinal canal. An oblique incision was made. Dissection was carried down through the subcutaneous tissue with cautery to the external oblique fascia.  There was significant scarring from the previous surgery.  We opened the external oblique fascia along the direction of its fibers to the external ring.  The fascia was dissected away from the previously placed mesh.  The spermatic cord was circumferentially  dissected bluntly and retracted with a Penrose drain. The floor of the inguinal canal was inspected and a large indirect hernia was noted.  We skeletonized the spermatic cord and reduced the hernia sac.  The internal ring was tightened with 2-0 Prolene.  We used a 3x6 Ultrapro mesh which was cut into a keyhole shape,  inserted and deployed across the floor of the inguinal canal. The mesh was tucked underneath the external oblique fascia laterally.  The flap of the mesh was closed around the spermatic cord to recreate the internal inguinal ring.  The mesh was secured to the pubic tubercle with 2-0 Prolene.  We ran this along the shelving edge inferiorly and the internal oblique fascia superiorly.  The tails of the mesh were sutured together with two 2-0 Prolene sutures.  The external oblique fascia was reapproximated with 2-0 Vicryl.  3-0 Vicryl was used to close the subcutaneous tissues and 4-0 Monocryl was used to close the skin in subcuticular fashion.  Benzoin and steri-strips were used to seal the incision.  A clean dressing was applied.  The patient was then extubated and brought to the recovery room in stable condition.  All sponge, instrument, and needle counts were correct prior to closure and at the conclusion of the case.   Estimated Blood Loss: Minimal                 Complications: None; patient tolerated the procedure well.         Disposition: PACU - hemodynamically stable.         Condition: stable   Imogene Burn. Georgette Dover, MD, [facility] Surgery  General/ Trauma Surgery  03/20/2014 9:14 AM

## 2014-03-20 NOTE — Transfer of Care (Signed)
Immediate Anesthesia Transfer of Care Note  Patient: [name]  Procedure(s) Performed: Procedure(s): OPEN REPAIR OF RIGHT INGUINAL WITH MESH (Right) INSERTION OF MESH (Right)  Patient Location: PACU  Anesthesia Type:General  Level of Consciousness: awake and alert   Airway & Oxygen Therapy: Patient Spontanous Breathing and Patient connected to nasal cannula oxygen  Post-op Assessment: Report given to PACU RN and Post -op Vital signs reviewed and stable  Post vital signs: Reviewed and stable  Complications: No apparent anesthesia complications

## 2014-03-20 NOTE — Anesthesia Procedure Notes (Addendum)
Anesthesia Regional Block:  TAP block  Pre-Anesthetic Checklist: ,, timeout performed, Correct Patient, Correct Site, Correct Laterality, Correct Procedure, Correct Position, site marked, Risks and benefits discussed,  Surgical consent,  Pre-op evaluation,  At surgeon's request and post-op pain management  Laterality: Right and Lower  Prep: chloraprep       Needles:   Needle Type: Echogenic Needle     Needle Length: 9cm 9 cm Needle Gauge: 21 and 21 G  Needle insertion depth: 7 cm   Additional Needles:  Procedures: ultrasound guided (picture in chart) TAP block Narrative:  Start time: 03/20/2014 7:00 AM End time: 03/20/2014 7:16 AM Injection made incrementally with aspirations every 5 mL.  Performed by: Personally  Anesthesiologist: MASSAGEE, TERRY  Additional Notes: Tolerated well   Procedure Name: Intubation Date/Time: 03/20/2014 7:52 AM Performed by: AUSTON, AMANDA M Pre-anesthesia Checklist: Patient identified, Timeout performed, Emergency Drugs available, Suction available and Patient being monitored Patient Re-evaluated:Patient Re-evaluated prior to inductionOxygen Delivery Method: Circle system utilized Preoxygenation: Pre-oxygenation with 100% oxygen Intubation Type: IV induction Ventilation: Mask ventilation without difficulty Laryngoscope Size: Miller and 2 Grade View: Grade I Tube type: Oral Tube size: 7.0 mm Number of attempts: 1 Airway Equipment and Method: LTA kit utilized Placement Confirmation: ETT inserted through vocal cords under direct vision,  breath sounds checked- equal and bilateral and positive ETCO2 Secured at: 22 cm Tube secured with: Tape Dental Injury: Teeth and Oropharynx as per pre-operative assessment  Comments: Intubated after unable to seat properly an LMA 4 and then LMA 5.     

## 2014-03-20 NOTE — Anesthesia Postprocedure Evaluation (Signed)
  Anesthesia Post-op Note  Patient: [name]  Procedure(s) Performed: Procedure(s): OPEN REPAIR OF RIGHT INGUINAL WITH MESH (Right) INSERTION OF MESH (Right)  Patient Location: PACU  Anesthesia Type:GA combined with regional for post-op pain  Level of Consciousness: awake and alert   Airway and Oxygen Therapy: Patient Spontanous Breathing  Post-op Pain: none  Post-op Assessment: Post-op Vital signs reviewed  Post-op Vital Signs: stable  Last Vitals:  Filed Vitals:   03/20/14 0945  BP: 107/68  Pulse: 69  Temp:   Resp: 16    Complications: No apparent anesthesia complications

## 2014-03-20 NOTE — Addendum Note (Signed)
Addendum  created 03/20/14 1305 by Suzy Bouchard, CRNA   Modules edited: Charges VN

## 2014-03-20 NOTE — Interval H&P Note (Signed)
History and Physical Interval Note:  03/20/2014 7:10 AM  [name]  has presented today for surgery, with the diagnosis of Right Inguinal Hernia  The various methods of treatment have been discussed with the patient and family. After consideration of risks, benefits and other options for treatment, the patient has consented to  Procedure(s): OPEN REPAIR OF RIGHT INGUINAL WITH MESH (Right) INSERTION OF MESH (Right) as a surgical intervention .  The patient's history has been reviewed, patient examined, no change in status, stable for surgery.  I have reviewed the patient's chart and labs.  Questions were answered to the patient's satisfaction.     Maliaka Brasington K.

## 2014-03-21 ENCOUNTER — Encounter (HOSPITAL_COMMUNITY): Payer: Self-pay | Admitting: Surgery

## 2014-03-21 DIAGNOSIS — M1990 Unspecified osteoarthritis, unspecified site: Secondary | ICD-10-CM | POA: Diagnosis not present

## 2014-03-21 DIAGNOSIS — K4091 Unilateral inguinal hernia, without obstruction or gangrene, recurrent: Secondary | ICD-10-CM | POA: Diagnosis not present

## 2014-03-21 DIAGNOSIS — I2510 Atherosclerotic heart disease of native coronary artery without angina pectoris: Secondary | ICD-10-CM | POA: Diagnosis not present

## 2014-03-21 DIAGNOSIS — I4891 Unspecified atrial fibrillation: Secondary | ICD-10-CM | POA: Diagnosis not present

## 2014-03-21 DIAGNOSIS — K219 Gastro-esophageal reflux disease without esophagitis: Secondary | ICD-10-CM | POA: Diagnosis not present

## 2014-03-21 DIAGNOSIS — I509 Heart failure, unspecified: Secondary | ICD-10-CM | POA: Diagnosis not present

## 2014-03-21 MED ORDER — HYDROCODONE-ACETAMINOPHEN 5-325 MG PO TABS: 1.0000 | ORAL_TABLET | ORAL | Status: DC | PRN

## 2014-03-21 NOTE — Discharge Summary (Signed)
Physician Discharge Summary  Patient ID: [name] MRN: [id number] DOB/AGE: 04-29-1929 81 y.o.  Admit date: 03/20/2014 Discharge date: 03/21/2014  Admission Diagnoses:Recurrent right inguinal hernia  Discharge Diagnoses: Same Active Problems:   Recurrent right inguinal hernia   Discharged Condition: good  Hospital Course: Open repair of recurrent right inguinal hernia with mesh on 03/21/14.  TAP Block.  He was kept overnight for observation because of cardiac status.  No problems.  Ready for discharge.  Consults: None  Significant Diagnostic Studies: none  Treatments: surgery: Recurrent RIH repair with mesh  Discharge Exam: Blood pressure 91/54, pulse 70, temperature 98.7 F (37.1 C), temperature source Oral, resp. rate 18, height 5\' 6"  (1.676 m), weight 160 lb 1 oz (72.604 kg), SpO2 94 %. General appearance: alert, cooperative and no distress Incision c/d/i  Disposition: 01-Home or Self Care  Restart all home meds.   Medication List    ASK your doctor about these medications        amiodarone 200 MG tablet  Commonly known as:  PACERONE  Take 100 mg by mouth daily.     BENEFIBER DRINK MIX PO  Take 1 each by mouth 3 (three) times daily. Mix 1 tablespoon in liquid and drink once daily.     carvedilol 6.25 MG tablet  Commonly known as:  COREG  Take 6.25 mg by mouth 2 (two) times daily with a meal.     enoxaparin 100 MG/ML injection  Commonly known as:  LOVENOX  Inject 1 mL (100 mg total) into the skin daily.     erythromycin ophthalmic ointment  Place a 1/2 inch ribbon of ointment into the left lower eyelid qid     fluticasone 50 MCG/ACT nasal spray  Commonly known as:  FLONASE  Place 2 sprays into both nostrils daily.     furosemide 20 MG tablet  Commonly known as:  LASIX  Take 1 tablet (20 mg total) by mouth daily.     lisinopril 2.5 MG tablet  Commonly known as:  PRINIVIL,ZESTRIL  Take 1 tablet by mouth  daily     potassium chloride SA 20 MEQ  tablet  Commonly known as:  K-DUR,KLOR-CON  Take 20 mEq by mouth daily.     pravastatin 40 MG tablet  Commonly known as:  PRAVACHOL  Take 40 mg by mouth daily.     tamsulosin 0.4 MG Caps capsule  Commonly known as:  FLOMAX  Take 0.4 mg by mouth daily.     warfarin 5 MG tablet  Commonly known as:  COUMADIN  Take 2.5-5 mg by mouth daily. Take 5 mg by mouth on Tuesday, Thursday and Sunday. Take 2.5 mg by mouth on all other days           Follow-up Information    Follow up with Maia Petties., MD In 3 weeks.   Specialty:  General Surgery   Contact information:   [address] [place] [id number] [phone]       Signed: Maia Petties. 03/21/2014, 8:28 AM

## 2014-03-21 NOTE — Discharge Instructions (Signed)
[facility] ° ° INGUINAL HERNIA REPAIR: POST OP INSTRUCTIONS ° °Always review your discharge instruction sheet given to you by the facility where your surgery was performed. °IF YOU HAVE DISABILITY OR FAMILY LEAVE FORMS, YOU MUST BRING THEM TO THE OFFICE FOR PROCESSING.   °DO NOT GIVE THEM TO YOUR DOCTOR. ° °1. A  prescription for pain medication may be given to you upon discharge.  Take your pain medication as prescribed, if needed.  If narcotic pain medicine is not needed, then you may take acetaminophen (Tylenol) or ibuprofen (Advil) as needed. °2. Take your usually prescribed medications unless otherwise directed. °3. If you need a refill on your pain medication, please contact your pharmacy.  They will contact our office to request authorization. Prescriptions will not be filled after 5 pm or on week-ends. °4. You should follow a light diet the first 24 hours after arrival home, such as soup and crackers, etc.  Be sure to include lots of fluids daily.  Resume your normal diet the day after surgery. °5. Most patients will experience some swelling and bruising around the umbilicus or in the groin and scrotum.  Ice packs and reclining will help.  Swelling and bruising can take several days to resolve.  °6. It is common to experience some constipation if taking pain medication after surgery.  Increasing fluid intake and taking a stool softener (such as Colace) will usually help or prevent this problem from occurring.  A mild laxative (Milk of Magnesia or Miralax) should be taken according to package directions if there are no bowel movements after 48 hours. °7. Unless discharge instructions indicate otherwise, you may remove your bandages 24-48 hours after surgery, and you may shower at that time.  You will have steri-strips (small skin tapes) in place directly over the incision.  These strips should be left on the skin for 7-10 days. °8. ACTIVITIES:  You may resume regular (light) daily activities  beginning the next day--such as daily self-care, walking, climbing stairs--gradually increasing activities as tolerated.  You may have sexual intercourse when it is comfortable.  Refrain from any heavy lifting or straining until approved by your doctor. °a. You may drive when you are no longer taking prescription pain medication, you can comfortably wear a seatbelt, and you can safely maneuver your car and apply brakes. °b. RETURN TO WORK:  2-3 weeks with light duty - no lifting over 15 lbs. °9. You should see your doctor in the office for a follow-up appointment approximately 2-3 weeks after your surgery.  Make sure that you call for this appointment within a day or two after you arrive home to insure a convenient appointment time. °10. OTHER INSTRUCTIONS:  __________________________________________________________________________________________________________________________________________________________________________________________  °WHEN TO CALL YOUR DOCTOR: °1. Fever over 101.0 °2. Inability to urinate °3. Nausea and/or vomiting °4. Extreme swelling or bruising °5. Continued bleeding from incision. °6. Increased pain, redness, or drainage from the incision ° °The clinic staff is available to answer your questions during regular business hours.  Please don’t hesitate to call and ask to speak to one of the nurses for clinical concerns.  If you have a medical emergency, go to the nearest emergency room or call 911.  A surgeon from [facility] is always on call at the hospital ° ° °[address], [address], [place], [place]  [address] ? ° [address] [address], [place], [place]   [address] °[phone]    [phone]    FAX [phone] °Web site:   [email or web address] ° ° °

## 2014-03-21 NOTE — Progress Notes (Signed)
Discharge instructions gone over with patient and spouse. Home medications gone over. Prescription given. Follow up appointment to be made. Diet, incisional care, activity, and reasons to call the doctor gone over. Patient has my chart. He verbalized signs and symptoms of infection and understanding of discharge instructions.

## 2014-03-26 ENCOUNTER — Ambulatory Visit (INDEPENDENT_AMBULATORY_CARE_PROVIDER_SITE_OTHER): Payer: Medicare Other

## 2014-03-26 DIAGNOSIS — I4891 Unspecified atrial fibrillation: Secondary | ICD-10-CM

## 2014-03-26 LAB — POCT INR: INR: 1.5

## 2014-04-04 ENCOUNTER — Ambulatory Visit (INDEPENDENT_AMBULATORY_CARE_PROVIDER_SITE_OTHER): Payer: Medicare Other | Admitting: *Deleted

## 2014-04-04 DIAGNOSIS — I4891 Unspecified atrial fibrillation: Secondary | ICD-10-CM

## 2014-04-04 LAB — POCT INR: INR: 2.2

## 2014-04-06 ENCOUNTER — Encounter: Payer: Self-pay | Admitting: *Deleted

## 2014-04-09 ENCOUNTER — Telehealth: Payer: Self-pay | Admitting: Cardiology

## 2014-04-09 MED ORDER — AMIODARONE HCL 200 MG PO TABS: 100.0000 mg | ORAL_TABLET | Freq: Every day | ORAL | Status: DC

## 2014-04-09 NOTE — Telephone Encounter (Signed)
New message      Talk to [facility] regarding a medication.  He would not tell me any more than this.

## 2014-04-09 NOTE — Telephone Encounter (Signed)
Pt requesting refill of amiodarone to [facility] mail order pharmacy.  I have done this.

## 2014-04-16 ENCOUNTER — Telehealth: Payer: Self-pay | Admitting: Internal Medicine

## 2014-04-16 ENCOUNTER — Other Ambulatory Visit: Payer: Self-pay | Admitting: *Deleted

## 2014-04-16 MED ORDER — LISINOPRIL 2.5 MG PO TABS: ORAL_TABLET | ORAL | Status: DC

## 2014-04-16 MED ORDER — POTASSIUM CHLORIDE CRYS ER 20 MEQ PO TBCR: 20.0000 meq | EXTENDED_RELEASE_TABLET | Freq: Every day | ORAL | Status: DC

## 2014-04-16 NOTE — Telephone Encounter (Signed)
Informed pt that I would mail him a cell adapter. Pt verbalized understanding.

## 2014-04-16 NOTE — Telephone Encounter (Signed)
°  1. Has your device fired? No  2. Is you device beeping? No  3. Are you experiencing draining or swelling at device site? No  4. Are you calling to see if we received your device transmission? No  5. Have you passed out? No   Pt calling stating that he received a letter saying we haven't received his remote pacer check transmissions. Please call back and advise.

## 2014-04-20 ENCOUNTER — Ambulatory Visit (INDEPENDENT_AMBULATORY_CARE_PROVIDER_SITE_OTHER): Payer: Medicare Other | Admitting: Nurse Practitioner

## 2014-04-20 ENCOUNTER — Encounter: Payer: Self-pay | Admitting: Nurse Practitioner

## 2014-04-20 VITALS — BP 110/60 | HR 72 | Ht 66.0 in | Wt 161.0 lb

## 2014-04-20 DIAGNOSIS — R159 Full incontinence of feces: Secondary | ICD-10-CM | POA: Diagnosis not present

## 2014-04-20 NOTE — Patient Instructions (Addendum)
We will contact you regarding you appointment for hemorrhoidal banding with Dr. Carlean Purl along with instruction on your coumadin for that procedure. Please call our office if you do not hear back from [place] in a week.    CB:[id number] [employer]

## 2014-04-20 NOTE — Progress Notes (Addendum)
     History of Present Illness:  Patient is an 81 year old male known to Dr. Carlean Purl. He was seen here August 2015 for evaluation of incontinence. Follow up colonoscopy revealed internal hemorrhoids and diverticulosis. Patient was advised to follow up if incontinence episodes persisted.   Patient still having episodes of incontinence. Typically his first BM of day is thick, unformed. Following that patient has 3-4 episodes of loose stool (small volume throughout the day). He takes fiber three times daily. It doesn't seem like 1-2 times daily is enough. Patient isn't concerned about the number of bowel movements he has a day but rather that he is having some episodes of seepage.   Current Medications, Allergies, Past Medical History, Past Surgical History, Family History and Social History were reviewed in [employer] record.  Physical Exam: General: Pleasant, well developed , white male in no acute distress Head: Normocephalic and atraumatic Eyes:  sclerae anicteric, conjunctiva pink  Ears: Normal auditory acuity Lungs: Clear throughout to auscultation Heart: Regular rate and rhythm Abdomen: Soft, non distended, non-tender. No masses, no hepatomegaly. Normal bowel sounds Rectal: mildly inflamed internal hemorrhoids on anoscopy. Adequate sphincter tone Neurological: Alert oriented x 4, grossly nonfocal Psychological:  Alert and cooperative. Normal mood and affect  Assessment and Recommendations:   60. 81 year old male with altered bowel habits characterized by small volume bowel movements that become more liquid throughout the day. Patient is taking fiber 3 times a day,  which seems excessive but 1-2 times daily isn't enough. He seems to have incomplete emptying. Patient not really bothered by frequency of BMs. He is most concerned with #2.   #2. Fecal seepage. I don't think this is overflow. Patient isn't having normal BMs but he is having several a day. Patient has  internal hemorrhoids which could be contributing to the seepage. I will arrange for him to see Dr. Carlean Purl, his primary GI doctor, for consideration of hemorrhoidal banding. Patient is on coumadin which will have to be held for banding.   #3. Multiple medical problem as listed in [place]

## 2014-04-23 NOTE — Progress Notes (Signed)
I will ask his cardiologist about holding warfarin for about 2 weeks total and then if that is acceptable will consider banding hemorrhoids to see if that helps his incontinence (after I see him and review his situation, risks and benefits).  Gatha Mayer, MD, [facility]

## 2014-04-25 ENCOUNTER — Ambulatory Visit (INDEPENDENT_AMBULATORY_CARE_PROVIDER_SITE_OTHER): Payer: Medicare Other | Admitting: *Deleted

## 2014-04-25 DIAGNOSIS — I4891 Unspecified atrial fibrillation: Secondary | ICD-10-CM

## 2014-04-25 LAB — POCT INR: INR: 2.1

## 2014-05-02 ENCOUNTER — Telehealth: Payer: Self-pay | Admitting: Internal Medicine

## 2014-05-02 NOTE — Telephone Encounter (Signed)
Error

## 2014-05-08 ENCOUNTER — Telehealth: Payer: Self-pay | Admitting: Family Medicine

## 2014-05-08 MED ORDER — WARFARIN SODIUM 5 MG PO TABS: 2.5000 mg | ORAL_TABLET | Freq: Every day | ORAL | Status: DC

## 2014-05-08 NOTE — Telephone Encounter (Signed)
Pt needs new rx warfarin 5 mg #90 w/refills sent to [facility]

## 2014-05-08 NOTE — Telephone Encounter (Signed)
Medication refilled

## 2014-05-14 ENCOUNTER — Encounter: Payer: Self-pay | Admitting: Internal Medicine

## 2014-05-14 ENCOUNTER — Ambulatory Visit (INDEPENDENT_AMBULATORY_CARE_PROVIDER_SITE_OTHER): Payer: Medicare Other | Admitting: Internal Medicine

## 2014-05-14 VITALS — BP 90/50 | HR 76 | Ht 65.5 in | Wt 159.0 lb

## 2014-05-14 DIAGNOSIS — K648 Other hemorrhoids: Secondary | ICD-10-CM | POA: Diagnosis not present

## 2014-05-14 DIAGNOSIS — R159 Full incontinence of feces: Secondary | ICD-10-CM | POA: Diagnosis not present

## 2014-05-14 NOTE — Patient Instructions (Addendum)
Stop your benefiber and start Miralax 17 grams (1 dose) in liquid nightly.  Coupon provided.   Please send [place] a message in MyChart in a week or so to let us know if symptoms have improved.   I appreciate the opportunity to care for you. Silvano Rusk, M.D., [facility]

## 2014-05-14 NOTE — Progress Notes (Signed)
   Subjective:    Patient ID: [name], male    DOB: 07-15-1929, 81 y.o.   MRN: [id number] Chief complaint: Fecal soiling difficulty cleansing after defecation HPI The patient is here for follow-up. He has tried fiber supplementation for difficulty with cleansing the anal area after defecation. He has to white multiple times and he never seems to have a complete bowel movement. I had noted hemorrhoids previously and wondered if those are not part of the problem were all the problem. He saw [name] nurse practitioner, and because of my mention of hemorrhoids and possible treatment of those she talked about banding with him. However he takes warfarin to prevent stroke in atrial fibrillation. He also has a cardiomyopathy. He does not have any rectal bleeding.  He does not seem to have symptomatic prolapse of his hemorrhoids that I can tell.  Medications, allergies, past medical history, past surgical history, family history and social history are reviewed and updated in the EMR.  Review of Systems As above, fairly vigorous at 85    Objective:   Physical Exam BP 90/50 mmHg  Pulse 76  Ht 5' 5.5" (1.664 m)  Wt 159 lb (72.122 kg)  BMI 26.05 kg/m2  Rectal exam was performed in left lateral position and shows slightly reduced resting sphincter tone but good squeeze. There is a fair amount of formed stool in the rectal vault but no impaction. The exam is nontender. There is some perianal erythema but no other anoderm findings.      Assessment & Plan:   1. Fecal soiling due to fecal incontinence   2. Internal hemorrhoids    I think, at this point his problem may be more of a constipation issue. He is tried up to 3 tablespoons of Benefiber daily with some improvement he thinks. Then he thought he was taking too much and went to 3 teaspoons daily. At any rate it sounds like he is given fiber a good try. I explained the difficulties and risks of possible bleeding after hemorrhoid  banding, the stroke risk if we hold warfarin though low is real, and we have decided to give more aggressive laxative treatment a try. I wonder if he does not empty his bowels better if his symptoms will not be relieved. He reports having to return to the bathroom 3 and 4 times after his initial bowel movement of the day. So we'll try MiraLAX 17 g nightly, and discontinue fiber supplementation at this time. He will contact me through my chart where the phone to give me an update as to how that is working. Should he have persistent problems we can consider banding but he would need to hold his warfarin probably starting 2 days before the banding and then for a week after the banding.

## 2014-05-15 ENCOUNTER — Telehealth: Payer: Self-pay | Admitting: Family Medicine

## 2014-05-15 NOTE — Telephone Encounter (Signed)
Pt request refill of the following: warfarin (COUMADIN) 5 MG tablet  Pt need this called in today    Phamacy:  [facility]

## 2014-05-16 MED ORDER — WARFARIN SODIUM 5 MG PO TABS: 2.5000 mg | ORAL_TABLET | Freq: Every day | ORAL | Status: DC

## 2014-05-16 NOTE — Telephone Encounter (Signed)
Rx sent 

## 2014-05-16 NOTE — Telephone Encounter (Signed)
See below

## 2014-05-21 ENCOUNTER — Encounter: Payer: Self-pay | Admitting: Internal Medicine

## 2014-05-23 ENCOUNTER — Ambulatory Visit (INDEPENDENT_AMBULATORY_CARE_PROVIDER_SITE_OTHER): Payer: Medicare Other | Admitting: *Deleted

## 2014-05-23 ENCOUNTER — Other Ambulatory Visit: Payer: Self-pay | Admitting: Cardiology

## 2014-05-23 DIAGNOSIS — I4891 Unspecified atrial fibrillation: Secondary | ICD-10-CM | POA: Diagnosis not present

## 2014-05-23 LAB — POCT INR: INR: 2

## 2014-05-23 NOTE — Telephone Encounter (Signed)
New Msg       Pt c/o medication issue:  1. Name of Medication: coumadin  2. How are you currently taking this medication (dosage and times per day)? 5 mg   3. Are you having a reaction (difficulty breathing--STAT)? No   4. What is your medication issue? Pt states mail order needs more information before refilling prescription.     Please return call.

## 2014-05-24 NOTE — Telephone Encounter (Signed)
Called [facility] mail order pharmacy, wanted to verify we were aware of Amiodarone and Warfarin drug interaction.  Advised we were aware pt was rx both drugs and is currently taking and being monitored appropriately.  She states they received fax back on 05/22/14 confirming this as well, medication in being processed and will be shipped out soon.  Called advised pt of above.

## 2014-06-01 ENCOUNTER — Encounter: Payer: Self-pay | Admitting: Family Medicine

## 2014-06-04 ENCOUNTER — Other Ambulatory Visit: Payer: Self-pay

## 2014-06-04 DIAGNOSIS — I4891 Unspecified atrial fibrillation: Secondary | ICD-10-CM

## 2014-06-04 DIAGNOSIS — I5022 Chronic systolic (congestive) heart failure: Secondary | ICD-10-CM

## 2014-06-04 MED ORDER — FLUTICASONE PROPIONATE 50 MCG/ACT NA SUSP: 2.0000 | Freq: Every day | NASAL | Status: DC

## 2014-06-04 MED ORDER — FUROSEMIDE 20 MG PO TABS: 20.0000 mg | ORAL_TABLET | Freq: Every day | ORAL | Status: DC

## 2014-06-26 ENCOUNTER — Encounter: Payer: Self-pay | Admitting: Family Medicine

## 2014-06-26 ENCOUNTER — Ambulatory Visit (INDEPENDENT_AMBULATORY_CARE_PROVIDER_SITE_OTHER): Payer: Medicare Other | Admitting: Family Medicine

## 2014-06-26 VITALS — BP 102/80 | HR 72 | Temp 97.4°F | Wt 161.0 lb

## 2014-06-26 DIAGNOSIS — M79644 Pain in right finger(s): Secondary | ICD-10-CM

## 2014-06-26 NOTE — Progress Notes (Signed)
Garret Reddish, MD Phone: [phone]  Subjective:   [name] is a 81 y.o. year old very pleasant male patient who presents with the following:  Right MCP pain 3-4 months of pain at least, right handed. Pain rated as moderate though worsening. Worse over last few weeks, now with pain even shaking hands or turning door knobs. No radiation of pain. Feels click at IP joint with some pain at times but not primary concern. Hurts on all sides of MCP joint. Has tried some tylenol but usually tries to avoid nsaids.   ROS- no fever/chills. No history of gout. No weakness in thumb other than due to pain. Intact distal sensation.   Past Medical History- Patient Active Problem List   Diagnosis Date Noted  . Chronic systolic CHF (congestive heart failure) 03/21/2013    Priority: High  . Nonischemic cardiomyopathy 09/20/2010    Priority: High  . PPM-St.Jude 11/27/2008    Priority: High  . CAD (coronary artery disease) 07/20/2008    Priority: High  . SICK SINUS SYNDROME 07/20/2008    Priority: High  . Atrial fibrillation 03/16/2007    Priority: High  . Chronic cough 08/18/2013    Priority: Medium  . Dyspnea 08/16/2013    Priority: Medium  . Pleural effusion, right 08/16/2013    Priority: Medium  . Pleural effusion on right 07/06/2012    Priority: Medium  . Hyperlipidemia 03/16/2007    Priority: Medium  . BPH (benign prostatic hyperplasia) 03/16/2007    Priority: Medium  . Allergic rhinitis 02/14/2014    Priority: Low  . Fecal incontinence 10/18/2013    Priority: Low  . Recurrent right inguinal hernia 11/22/2012    Priority: Low  . Intention tremor 11/01/2012    Priority: Low  . Polymyalgia rheumatica 07/03/2008    Priority: Low  . MIXED HEARING LOSS BILATERAL 01/10/2008    Priority: Low  . GERD 03/16/2007    Priority: Low  . Fecal soiling due to fecal incontinence 04/20/2014   Medications- reviewed and updated Current Outpatient Prescriptions  Medication Sig  Dispense Refill  . amiodarone (PACERONE) 200 MG tablet Take 0.5 tablets (100 mg total) by mouth daily. 90 tablet 3  . carvedilol (COREG) 6.25 MG tablet Take 6.25 mg by mouth 2 (two) times daily with a meal.     . fluticasone (FLONASE) 50 MCG/ACT nasal spray Place 2 sprays into both nostrils daily. 16 g 5  . furosemide (LASIX) 20 MG tablet Take 1 tablet (20 mg total) by mouth daily. 90 tablet 3  . lisinopril (PRINIVIL,ZESTRIL) 2.5 MG tablet Take 1 tablet by mouth  daily 90 tablet 0  . potassium chloride SA (K-DUR,KLOR-CON) 20 MEQ tablet Take 1 tablet (20 mEq total) by mouth daily. 90 tablet 0  . pravastatin (PRAVACHOL) 40 MG tablet Take 40 mg by mouth daily.     . tamsulosin (FLOMAX) 0.4 MG CAPS Take 0.4 mg by mouth daily.     Marland Kitchen warfarin (COUMADIN) 5 MG tablet Take 0.5-1 tablets (2.5-5 mg total) by mouth daily. As directed by anticoagulation clinic 90 tablet 1   No current facility-administered medications for this visit.    Objective: BP 102/80 mmHg  Pulse 72  Temp(Src) 97.4 F (36.3 C)  Wt 161 lb (73.029 kg) Gen: NAD, resting comfortably  Right MCP joint with some enlargement of joint compared to R hand No edema or erythema Pain with palpation around MCP Full range of motion  4/5 strength at joint limited by pain Clicking at IP  joint but cannot feel clicking at base of thumb, no pain at palpation of IP joint  Assessment/Plan:  Right MCP pain Suspect MCP arthritis. No history of gout and appearance not classic. Refer to sports medicine for eval and potential steroid injection. Also has some clicking at IP joint and may have trigger thumb-would appreciate input if needs further support after MCP pain addressed.   Return precautions advised.   Orders Placed This Encounter  Procedures  . Ambulatory referral to Sports Medicine    Referral Priority:  Routine    Referral Type:  Consultation    Number of Visits Requested:  1

## 2014-06-26 NOTE — Patient Instructions (Signed)
We will call you within a week about your referral to Sports medicine. If you do not hear within 2 weeks, give [place] a call.   I think your MCP joint of your right thumb has arthritis in it and you may benefit from steroid injection and that is what sports medicine will evaluate you for

## 2014-06-30 ENCOUNTER — Encounter: Payer: Self-pay | Admitting: Internal Medicine

## 2014-07-02 ENCOUNTER — Ambulatory Visit (INDEPENDENT_AMBULATORY_CARE_PROVIDER_SITE_OTHER): Payer: Medicare Other | Admitting: *Deleted

## 2014-07-02 DIAGNOSIS — I4891 Unspecified atrial fibrillation: Secondary | ICD-10-CM | POA: Diagnosis not present

## 2014-07-02 DIAGNOSIS — I480 Paroxysmal atrial fibrillation: Secondary | ICD-10-CM

## 2014-07-02 DIAGNOSIS — Z5181 Encounter for therapeutic drug level monitoring: Secondary | ICD-10-CM | POA: Diagnosis not present

## 2014-07-02 LAB — POCT INR: INR: 2.3

## 2014-07-04 ENCOUNTER — Encounter: Payer: Self-pay | Admitting: Family Medicine

## 2014-07-04 ENCOUNTER — Other Ambulatory Visit (INDEPENDENT_AMBULATORY_CARE_PROVIDER_SITE_OTHER): Payer: Medicare Other

## 2014-07-04 ENCOUNTER — Ambulatory Visit (INDEPENDENT_AMBULATORY_CARE_PROVIDER_SITE_OTHER): Payer: Medicare Other | Admitting: Family Medicine

## 2014-07-04 VITALS — BP 104/60 | HR 79 | Ht 65.5 in | Wt 161.0 lb

## 2014-07-04 DIAGNOSIS — M79644 Pain in right finger(s): Secondary | ICD-10-CM | POA: Diagnosis not present

## 2014-07-04 DIAGNOSIS — M65311 Trigger thumb, right thumb: Secondary | ICD-10-CM | POA: Diagnosis not present

## 2014-07-04 NOTE — Patient Instructions (Signed)
Good to meet you Heat or ice 20 minutes 2 times a day as needed Wear splint at night Message is good.  Try pennsaid twice daily See me again in 2-3 weeks.

## 2014-07-04 NOTE — Assessment & Plan Note (Signed)
Patient does have trigger points and to different places and we discussed with patient about care. Patient is on the [place] bracing, home exercises, icing and heat, as well as massage. Patient and will come back on an as-needed basis if injection is necessary. Otherwise patient can do everything stated above.

## 2014-07-04 NOTE — Progress Notes (Signed)
Pre visit review using our clinic review tool, if applicable. No additional management support is needed unless otherwise documented below in the visit note. 

## 2014-07-04 NOTE — Progress Notes (Signed)
[facility] [address] [place], [place] [address] Phone: [phone] Subjective:    I'm seeing this patient by the request  of:  Garret Reddish, MD   CC: Right thumb pain  YBW:[id number] [name] is a 81 y.o. male coming in with complaint of right thumb pain. Patient is having some pain but not as much as he was when he saw his primary care provider. Patient states it is more of a dull throbbing aching sensation and continues to have the triggering but this is not getting and his weight. Patient states that he can do daily activities now and rates the pain as 2 out of 10. Patient though would like a diagnosis and to know what exactly is going on. Denies any numbness and denies any significant weakness. Patient states that he is sleeping comfortably as well but does have significant tightness in the mornings.  Past Medical History  Diagnosis Date  . Coronary artery disease   . Cardiomyopathy primary-nonischemic EF 45%  . Hyperlipidemia   . GERD (gastroesophageal reflux disease)   . Polymyalgia rheumatica   . Sick sinus syndrome   . Atrial fibrillation   . Dysphagia   . Hearing loss, mixed, bilateral   . Increased prostate specific antigen (PSA) velocity   . Lumbar back pain   . BPH (benign prostatic hypertrophy)   . Breast mass     "on both sides" (06/28/2012)  . CHF (congestive heart failure)   . Elevated liver enzymes   . Esophageal stricture   . Pacemaker infection     06/28/12  . Hx of cardiovascular stress test     Adenosine Myoview (07/2013):  Apical cap, apical lateral and mid anterolateral scar, small amount of peri-infarct ischemia, EF 36%; Medium Risk  . Pacemaker     st jude  . Diverticulosis of colon without hemorrhage 01/03/2014  . Internal hemorrhoids 01/03/2014  . Arthritis     "back" (03/20/2014)   Past Surgical History  Procedure Laterality Date  . Cataract extraction, bilateral Bilateral 1980's  . Umbilical hernia  repair  [id number] X 3  . Insert / replace / remove pacemaker    . Cardioversion  06/17/2011    Procedure: CARDIOVERSION;  Surgeon: Lelon Perla, MD;  Location: [place];  Service: Cardiovascular;  Laterality: N/A;  . Cardioversion  09/09/2011    Procedure: CARDIOVERSION;  Surgeon: Carlena Bjornstad, MD;  Location: [place];  Service: Cardiovascular;  Laterality: N/A;  . Cardioversion  01/01/2012    Procedure: CARDIOVERSION;  Surgeon: Hillary Bow, MD;  Location: [facility] ENDOSCOPY;  Service: Cardiovascular;  Laterality: N/A;  . Tee without cardioversion  01/01/2012    Procedure: TRANSESOPHAGEAL ECHOCARDIOGRAM (TEE);  Surgeon: Fay Records, MD;  Location: [facility] ENDOSCOPY;  Service: Cardiovascular;  Laterality: N/A;  . Generator removal Left 06/15/2012    Procedure: GENERATOR REMOVAL;  Surgeon: Evans Lance, MD;  Location: [place];  Service: Cardiovascular;  Laterality: Left;  . Pacemaker generator change N/A 06/15/2012    Procedure: PACEMAKER GENERATOR CHANGE;  Surgeon: Evans Lance, MD;  Location: [place];  Service: Cardiovascular;  Laterality: N/A;  . Tonsillectomy and adenoidectomy  1938  . Esophagogastroduodenoscopy    . Colonoscopy N/A 01/03/2014    Procedure: COLONOSCOPY;  Surgeon: Gatha Mayer, MD;  Location: [facility] ENDOSCOPY;  Service: Endoscopy;  Laterality: N/A;  . Permanent pacemaker insertion N/A 06/28/2012    Procedure: PERMANENT PACEMAKER INSERTION;  Surgeon: Evans Lance, MD;  Location: [place] CATH LAB;  Service: Cardiovascular;  Laterality: N/A;  . Lead revision N/A 06/29/2012    Procedure: LEAD REVISION;  Surgeon: Deboraha Sprang, MD;  Location: [facility] CATH LAB;  Service: Cardiovascular;  Laterality: N/A;  . Pacemaker revision  06/30/2012    Procedure: PACEMAKER LEAD REVISION;  Surgeon: Evans Lance, MD;  Location: [facility] CATH LAB;  Service: Cardiovascular;;  . Cardiac catheterization  1980's    [place]  . Paracentesis  ~ 09/8293    complication from pacer change in 2014  . Inguinal hernia repair Right  2012; 03/20/2014  . Breast surgery    . Inguinal hernia repair Right 03/20/2014    Procedure: OPEN REPAIR OF RIGHT INGUINAL WITH MESH;  Surgeon: Donnie Mesa, MD;  Location: [place];  Service: General;  Laterality: Right;  . Insertion of mesh Right 03/20/2014    Procedure: INSERTION OF MESH;  Surgeon: Donnie Mesa, MD;  Location: [place];  Service: General;  Laterality: Right;   Allergies  Allergen Reactions  . Antihistamines, Diphenhydramine-Type Other (See Comments)    Causes difficulty in ability to urinate.   Family History  Problem Relation Age of Onset  . Heart attack Father 41    deceased  . AAA (abdominal aortic aneurysm) Mother     deceased AAA  . Hypertension Mother   . Other Brother     deceased at birth   History   Social History  . Marital Status: Married    Spouse Name: N/A  . Number of Children: 3  . Years of Education: N/A   Occupational History  . retired     Engineering geologist   Social History Main Topics  . Smoking status: Former Smoker -- 1.00 packs/day for 40 years    Types: Cigarettes    Quit date: 03/10/1983  . Smokeless tobacco: Never Used  . Alcohol Use: 0.0 oz/week    0 Standard drinks or equivalent per week     Comment: quit drinking 2000-? alcohol related cardiomyopathy  . Drug Use: No  . Sexual Activity: Not Currently   Other Topics Concern  . None   Social History Narrative   Married. 3 children (6 together). 5 grandkids with with 5 grandkids (2nd marriage). No greatgrandkids.       Retired from [employer]      Hobbies: play golf, bridge, garden, write family stories        Past medical history, social, surgical and family history all reviewed in [facility] record.   Review of Systems: No headache, visual changes, nausea, vomiting, diarrhea, constipation, dizziness, abdominal pain, skin rash, fevers, chills, night sweats, weight loss, swollen lymph nodes, body aches, joint swelling, muscle  aches, chest pain, shortness of breath, mood changes.   Objective Blood pressure 104/60, pulse 79, height 5' 5.5" (1.664 m), weight 161 lb (73.029 kg), SpO2 94 %.  General: No apparent distress alert and oriented x3 mood and affect normal, dressed appropriately.  HEENT: Pupils equal, extraocular movements intact  Respiratory: Patient's speak in full sentences and does not appear short of breath  Cardiovascular: No lower extremity edema, non tender, no erythema  Skin: Warm dry intact with no signs of infection or rash on extremities or on axial skeleton.  Abdomen: Soft nontender  Neuro: Cranial nerves II through XII are intact, neurovascularly intact in all extremities with 2+ DTRs and 2+ pulses.  Lymph: No lymphadenopathy of posterior or anterior cervical chain or axillae bilaterally.  Gait normal with good balance and  coordination.  MSK:  Non tender with full range of motion and good stability and symmetric strength and tone of shoulders, elbows, wrist, hip, knee and ankles bilaterally.  Hand exam shows the patient does have osteophytic changes bilaterally. Patient is tender to palpation over the right [facility] joint and does have triggering at the [facility] joint as well as the IP joint.   Limited musculoskeletal ultrasound was performed and interpreted by Hulan Saas, M  Limited ultrasound showed the patient does have a moderate to severe osteophytic changes of the [facility] joint. Trigger nodules noted. Impression: Osteoarthritis of the right [facility] joint as well as trigger nodules of the IP and CMC.   Impression and Recommendations:     This case required medical decision making of moderate complexity.

## 2014-07-11 ENCOUNTER — Ambulatory Visit: Payer: Medicare Other | Admitting: Cardiology

## 2014-07-12 ENCOUNTER — Telehealth: Payer: Self-pay | Admitting: Cardiology

## 2014-07-12 NOTE — Telephone Encounter (Signed)
New problem    Pt want to speak to you concerning him missing his appt yesterday.

## 2014-07-12 NOTE — Telephone Encounter (Signed)
LMTCB

## 2014-07-12 NOTE — Telephone Encounter (Signed)
Spoke with patient about appt with Dr Aundra Dubin.

## 2014-07-26 ENCOUNTER — Encounter: Payer: Self-pay | Admitting: Cardiology

## 2014-07-26 ENCOUNTER — Ambulatory Visit (INDEPENDENT_AMBULATORY_CARE_PROVIDER_SITE_OTHER): Payer: Medicare Other | Admitting: Cardiology

## 2014-07-26 ENCOUNTER — Ambulatory Visit: Payer: Medicare Other | Admitting: Cardiology

## 2014-07-26 VITALS — BP 112/52 | HR 95 | Ht 65.5 in | Wt 160.0 lb

## 2014-07-26 DIAGNOSIS — I5022 Chronic systolic (congestive) heart failure: Secondary | ICD-10-CM | POA: Diagnosis not present

## 2014-07-26 DIAGNOSIS — I4891 Unspecified atrial fibrillation: Secondary | ICD-10-CM | POA: Diagnosis not present

## 2014-07-26 DIAGNOSIS — I2510 Atherosclerotic heart disease of native coronary artery without angina pectoris: Secondary | ICD-10-CM | POA: Diagnosis not present

## 2014-07-26 LAB — PROTIME-INR
INR: 1.98 — ABNORMAL HIGH (ref <1.50)
PROTHROMBIN TIME: 22.5 s — AB (ref 11.6–15.2)

## 2014-07-26 LAB — BASIC METABOLIC PANEL
BUN: 26 mg/dL — AB (ref 6–23)
CALCIUM: 9 mg/dL (ref 8.4–10.5)
CHLORIDE: 105 meq/L (ref 96–112)
CO2: 26 meq/L (ref 19–32)
CREATININE: 1.02 mg/dL (ref 0.50–1.35)
GLUCOSE: 89 mg/dL (ref 70–99)
Potassium: 4.4 mEq/L (ref 3.5–5.3)
Sodium: 138 mEq/L (ref 135–145)

## 2014-07-26 LAB — HEPATIC FUNCTION PANEL
ALT: 19 U/L (ref 0–53)
AST: 19 U/L (ref 0–37)
Albumin: 4 g/dL (ref 3.5–5.2)
Alkaline Phosphatase: 51 U/L (ref 39–117)
BILIRUBIN TOTAL: 0.6 mg/dL (ref 0.2–1.2)
Bilirubin, Direct: 0.2 mg/dL (ref 0.0–0.3)
Indirect Bilirubin: 0.4 mg/dL (ref 0.2–1.2)
Total Protein: 6.7 g/dL (ref 6.0–8.3)

## 2014-07-26 MED ORDER — AMIODARONE HCL 200 MG PO TABS: 200.0000 mg | ORAL_TABLET | Freq: Every day | ORAL | Status: DC

## 2014-07-26 NOTE — Patient Instructions (Signed)
Medication Instructions:   Increase amiodarone to 200mg  two times a day for 1 week, then decrease amiodarone to 200mg  daily.   Labwork: BMET/Liver profile/TSH/PT/INR today.  Testing/Procedures: None today  Follow-Up: Your physician recommends that you schedule a follow-up appointment in: about 4 weeks with Dr Aundra Dubin.  I will ask [facility] to call you tomorrow with coumadin instructions. Dr Aundra Dubin wants you to have your coumadin checked weekly until you see him on June 16,2016.

## 2014-07-27 ENCOUNTER — Telehealth: Payer: Self-pay | Admitting: *Deleted

## 2014-07-27 ENCOUNTER — Other Ambulatory Visit: Payer: Self-pay | Admitting: Internal Medicine

## 2014-07-27 ENCOUNTER — Ambulatory Visit (INDEPENDENT_AMBULATORY_CARE_PROVIDER_SITE_OTHER): Payer: Medicare Other | Admitting: Cardiovascular Disease

## 2014-07-27 ENCOUNTER — Other Ambulatory Visit: Payer: Self-pay

## 2014-07-27 DIAGNOSIS — I480 Paroxysmal atrial fibrillation: Secondary | ICD-10-CM

## 2014-07-27 DIAGNOSIS — Z5181 Encounter for therapeutic drug level monitoring: Secondary | ICD-10-CM

## 2014-07-27 DIAGNOSIS — I4891 Unspecified atrial fibrillation: Secondary | ICD-10-CM

## 2014-07-27 LAB — CUP PACEART MISC DEVICE CHECK
Date Time Interrogation Session: 20160520071311
Pulse Gen Model: 3210
Pulse Gen Serial Number: 2926516

## 2014-07-27 LAB — TSH: TSH: 3.609 u[IU]/mL (ref 0.350–4.500)

## 2014-07-27 MED ORDER — AMIODARONE HCL 200 MG PO TABS: ORAL_TABLET | ORAL | Status: DC

## 2014-07-27 NOTE — Telephone Encounter (Signed)
See coumadin encounter on May 20th

## 2014-07-27 NOTE — Progress Notes (Signed)
Patient ID: [name], male   DOB: November 20, 1929, 81 y.o.   MRN: [id number] PCP: Dr. Yong [name]  81 yo with history of paroxysmal atrial fibrillation, nonischemic cardiomyopathy, and complete heart block with St Jude CRT-P system presents for cardiology followup.  He was admitted in 4/14 with pacemaker pocket infection.  His pacemaker was removed and temporary-permanent device was placed.  Later, he had the temporary-permanent device removed and a new CRT-P device was placed. He developed dyspnea post-operatively and was found to have a large right-sided pleural effusion.  He was admitted and got a chest tube for drainage. He had followup with Dr. Servando [name] and it was decided that he would not need VATS.  He had been on amiodarone for maintenance of NSR.  This had been more successful than Tikosyn.   Around 3/15, he started developing increasing exertional dyspnea.  He was short of breath walking up a hill or incline.  He could still walk on the treadmill for exercise for 10-15 minutes and use the elliptical without much trouble on most days.  No orthopnea or PND.  No chest pain.  He saw Dr [name] and was started on Lasix three times a week due to concern for volume overload.  This did not seem to have helped much.  After that, he had PFTs done which showed normal spirometry but DLCO 50% predicted.  Therefore, amiodarone was stopped due to concern for lung toxicity.  He saw [name] for a pre-operative evaluation prior to right inguinal hernia repair.  Given the exertional dyspnea, he was set up for adenosine Cardiolite in 5/15.  This showed EF 36% with a small area of primarily scar in the apex, apical lateral wall, and mid anterolateral wall.  He has not felt palpitations. Echo in 5/15 showed EF 40-45%, similar to prior study.   I had him see pulmonary for evaluation for amiodarone lung toxicity.  CT chest looked ok, and he was not thought to have amiodarone lung toxicity.   Over the last week, Craig  [name] has noted increased fatigue and mild dyspnea.  This is not typical for him.  However, he is still able to do all his exercises.  He is not limited and can walk on flat ground without difficulty. No chest pain.  No orthopnea or PND.  Weight is up 3 lbs from my last appointment with him.  ECG showed atrial fibrillation with controlled rate.   ECG: atrial fibrillation with V-pacing.   Labs (7/13); LDL 65, HDL 38 Labs (3/14): TSH normal Labs (4/14):  ALT 61, AST 62 Labs (5/14): K 4.5, creatinine 0.9, BNP 727, digoxin 0.9 Labs (6/14): LDL 77, HDL 34, K 4, creatinine 0.9, TSH normal Labs (10/14): digoxin 2.1, K 4.7, creatinine 1.0, TSH normal, LFTs normal Labs (3/15): K 4, creatinine 1.1 Labs (4/15): BNP 482 Labs (5/15): K 3.8, creatinine 1.2 Labs (6/15): K 4.3, creatinine 0.9 Labs (7/15): TSH normal, LFTs normal Labs (9/15): TSH normal, LFTs normal Labs (1/16): K 4.3, creatinine 1.07  PMH: 1. Low back pain 2. Hyperlipidemia 3. GERD 4. BPH 5. Polymyalgia rheumatica 6. Atrial fibrillation: Failed Tikosyn in the past.  Paroxysmal, on amiodarone and warfarin.  DCCV in 4/13, 7/13, 10/13.  Has held NSR with amiodarone.  However, concern for amiodarone lung toxicity: PFTs (4/15) with FVC 90% predicted, FEV1 90%, ratio 97%, DLCO 50%.   7. Transaminitis: Mild, attributed to amiodarone.  8. Complete heart block s/p St Jude CRT-P device.  Patient developed PCM pocket infection  in 4/14 and had his first system removed with placement of a temporary permanent PCM.  He later had CRT-P device replaced.  This was complicated by large right-sided hemorrhagic pleural effusion requiring chest tube.   9. Nonischemic cardiomyopathy: Echo (9/13) with EF 40-45%, diffuse HK, mild Craig.  Mild CAD only on prior LHC.  Echo (4/14) with EF 45-50%.  Adenosine Cardiolite (5/15) with EF 36% (visually appeared higher per report), small area of scar with peri-infarct ischemia in the apex, apical lateral wall, and mid  anterolateral wall. Echo (5/15) with EF 40-45%, basal to mid inferolateral severe hypokinesis, basal to mid anterolateral hypokinesis, mildly dilated RV with mildly decreased RV systolic function, mild to moderate Craig.   10. Large right pleural effusion requiring chest tube following removal and reimplantation of PCM. Most recent chest CT in 6/15 showed significant decrease in size of loculated pleural effusion since 5/14.    SH: Married, prior smoker (quit 1985), no ETOH x years, retired, lives at [employer]  Branch: Father with MI at 26, mother with AAA.   ROS: All systems reviewed and negative except as per HPI.   Current Outpatient Prescriptions  Medication Sig Dispense Refill  . carvedilol (COREG) 6.25 MG tablet Take 6.25 mg by mouth 2 (two) times daily with a meal.     . fluticasone (FLONASE) 50 MCG/ACT nasal spray Place 2 sprays into both nostrils daily. 16 g 5  . furosemide (LASIX) 20 MG tablet Take 1 tablet (20 mg total) by mouth daily. 90 tablet 3  . lisinopril (PRINIVIL,ZESTRIL) 2.5 MG tablet Take 1 tablet by mouth  daily 90 tablet 0  . potassium chloride SA (K-DUR,KLOR-CON) 20 MEQ tablet Take 1 tablet (20 mEq total) by mouth daily. 90 tablet 0  . pravastatin (PRAVACHOL) 80 MG tablet Take 40 mg by mouth daily.    . tamsulosin (FLOMAX) 0.4 MG CAPS Take 0.4 mg by mouth daily.     Marland Kitchen warfarin (COUMADIN) 5 MG tablet Take 0.5-1 tablets (2.5-5 mg total) by mouth daily. As directed by anticoagulation clinic 90 tablet 1  . amiodarone (PACERONE) 200 MG tablet Take 1 tablet (200 mg total) by mouth daily. 90 tablet 3   No current facility-administered medications for this visit.    BP 112/52 mmHg  Pulse 95  Ht 5' 5.5" (1.664 m)  Wt 160 lb (72.576 kg)  BMI 26.21 kg/m2 General: NAD Neck: JVP 7 cm, no thyromegaly or thyroid nodule.  Lungs: CTAB  CV: Nondisplaced PMI.  Heart regular S1/S2, no S3/S4, no murmur.  No edema.  No carotid bruit.  Normal pedal pulses.  Abdomen: Soft, nontender, no  hepatosplenomegaly, no distention.  Neurologic: Alert and oriented x 3.  Psych: Normal affect. Extremities: No clubbing or cyanosis.   Assessment/Plan: 1. Chronic systolic CHF: NYHA class II-IIII symptoms with no significant volume overload on exam today.  He has CRT-P.  Last echo in 5/15 with EF 40-45% and wall motion abnormalities, similar to prior echoes.  Presumed nonischemic cardiomyopathy (prior cath with only mild CAD).  He had an adenosine Cardiolite in 5/15 with EF 36% and possible small area of apical and lateral scar with peri-infarct ischemia.  He is symptomatically a little worse, suspect this is due to being back in atrial fibrillation.  - Continue Lasix 20 mg daily with BMET today.  - Continue current Coreg and lisinopril.  He has been unable to tolerate a higher lisinopril dose due to fatigue and BP remains soft today. 2. Atrial fibrillation: Paroxysmal.  He is back in atrial fibrillation today despite amiodarone 100 mg daily.  By symptoms, I suspect he has been in it for several days.  He tolerates atrial fibrillation poorly.  Pacemaker was checked today.  - He was seen by pulmonary and amiodarone lung toxicity is thought to be unlikely.  I am going to increase amiodarone to 200 mg bid x 1 week, then 200 mg daily until he seems me back 3-4 weeks.  We will start checking weekly INRs.  If INR stays > 2, will be able to cardiovert him without TEE (if he does not convert to NSR on his own).  - LFTs/TSH today, will get yearly eye exam.  3. Hyperlipidemia: History of nonobstructive CAD.  Good lipids on pravastatin.    [name] 07/27/2014

## 2014-07-27 NOTE — Telephone Encounter (Signed)
-----   Message from Katrine Coho, RN sent at 07/26/2014  5:28 PM EDT ----- He had a PT/INR drawn in lab today.  He was in atrial fibrillation when he saw Dr Aundra Dubin today.  Dr Aundra Dubin wants him to have weekly PT/INRs until he sees him 08/23/14 anticipating a cardioversion.   I told [name] someone would call him Friday.  Thanks.

## 2014-07-30 ENCOUNTER — Encounter: Payer: Self-pay | Admitting: *Deleted

## 2014-07-30 ENCOUNTER — Telehealth: Payer: Self-pay | Admitting: *Deleted

## 2014-07-30 NOTE — Telephone Encounter (Signed)
Pt states that he is feeling worse with a fib--pt states his heart rate is staying in the high 90s and he feels puffy although he has not gained weight.  Pt requesting Dr Aundra Dubin consider DCCV before follow up appt in June.  I reviewed with Dr Vassie Moment recommended pt increase lasix to 40mg  daily,KCL to 40 mEq daily, schedule TEE/DCCV this week with POC INR prior to TEE/DCCV.  TEE/DCCV scheduled for 08/03/14 (pt's choice) at [facility], [facility] INR at 4:30PM 08/02/14 ([place] with Dr Aundra Dubin), [facility] rep notified TEE/DCCV scheduled for 08/03/14 8AM.   Pt advised, verbalized understanding.

## 2014-08-02 ENCOUNTER — Ambulatory Visit (INDEPENDENT_AMBULATORY_CARE_PROVIDER_SITE_OTHER): Payer: Medicare Other

## 2014-08-02 ENCOUNTER — Telehealth: Payer: Self-pay | Admitting: Cardiology

## 2014-08-02 DIAGNOSIS — I4891 Unspecified atrial fibrillation: Secondary | ICD-10-CM | POA: Diagnosis not present

## 2014-08-02 DIAGNOSIS — Z5181 Encounter for therapeutic drug level monitoring: Secondary | ICD-10-CM

## 2014-08-02 DIAGNOSIS — I480 Paroxysmal atrial fibrillation: Secondary | ICD-10-CM

## 2014-08-02 LAB — POCT INR: INR: 4.6

## 2014-08-02 NOTE — Telephone Encounter (Signed)
New message      Pt is having a cardioversion tomorrow---he had a PT today.  Will he need any other test prior to cardioversion?

## 2014-08-02 NOTE — Telephone Encounter (Signed)
Pt advised he did not need further testing for TEE/Cardioversion tomorrow.

## 2014-08-03 ENCOUNTER — Ambulatory Visit (HOSPITAL_COMMUNITY)
Admission: RE | Admit: 2014-08-03 | Discharge: 2014-08-03 | Disposition: A | Discharge status: Home / Self Care | Payer: Medicare Other | Source: Ambulatory Visit | Attending: Cardiology | Admitting: Cardiology

## 2014-08-03 ENCOUNTER — Ambulatory Visit (HOSPITAL_COMMUNITY): Payer: Medicare Other

## 2014-08-03 ENCOUNTER — Encounter (HOSPITAL_COMMUNITY)
Admission: RE | Disposition: A | Discharge status: Home / Self Care | Payer: Self-pay | Source: Ambulatory Visit | Attending: Cardiology

## 2014-08-03 ENCOUNTER — Ambulatory Visit (HOSPITAL_COMMUNITY): Payer: Medicare Other | Admitting: Critical Care Medicine

## 2014-08-03 ENCOUNTER — Encounter (HOSPITAL_COMMUNITY): Payer: Self-pay | Admitting: Critical Care Medicine

## 2014-08-03 DIAGNOSIS — K219 Gastro-esophageal reflux disease without esophagitis: Secondary | ICD-10-CM | POA: Diagnosis not present

## 2014-08-03 DIAGNOSIS — Z950 Presence of cardiac pacemaker: Secondary | ICD-10-CM | POA: Insufficient documentation

## 2014-08-03 DIAGNOSIS — I495 Sick sinus syndrome: Secondary | ICD-10-CM | POA: Insufficient documentation

## 2014-08-03 DIAGNOSIS — I429 Cardiomyopathy, unspecified: Secondary | ICD-10-CM | POA: Insufficient documentation

## 2014-08-03 DIAGNOSIS — I480 Paroxysmal atrial fibrillation: Secondary | ICD-10-CM | POA: Diagnosis not present

## 2014-08-03 DIAGNOSIS — I2510 Atherosclerotic heart disease of native coronary artery without angina pectoris: Secondary | ICD-10-CM | POA: Diagnosis not present

## 2014-08-03 DIAGNOSIS — I351 Nonrheumatic aortic (valve) insufficiency: Secondary | ICD-10-CM | POA: Insufficient documentation

## 2014-08-03 DIAGNOSIS — E785 Hyperlipidemia, unspecified: Secondary | ICD-10-CM | POA: Insufficient documentation

## 2014-08-03 DIAGNOSIS — Z888 Allergy status to other drugs, medicaments and biological substances status: Secondary | ICD-10-CM | POA: Insufficient documentation

## 2014-08-03 DIAGNOSIS — Z87891 Personal history of nicotine dependence: Secondary | ICD-10-CM | POA: Insufficient documentation

## 2014-08-03 DIAGNOSIS — I340 Nonrheumatic mitral (valve) insufficiency: Secondary | ICD-10-CM | POA: Diagnosis not present

## 2014-08-03 DIAGNOSIS — Z79899 Other long term (current) drug therapy: Secondary | ICD-10-CM | POA: Insufficient documentation

## 2014-08-03 DIAGNOSIS — M353 Polymyalgia rheumatica: Secondary | ICD-10-CM | POA: Insufficient documentation

## 2014-08-03 DIAGNOSIS — Z9861 Coronary angioplasty status: Secondary | ICD-10-CM | POA: Diagnosis not present

## 2014-08-03 DIAGNOSIS — N400 Enlarged prostate without lower urinary tract symptoms: Secondary | ICD-10-CM | POA: Diagnosis not present

## 2014-08-03 DIAGNOSIS — Z7901 Long term (current) use of anticoagulants: Secondary | ICD-10-CM | POA: Insufficient documentation

## 2014-08-03 DIAGNOSIS — I5022 Chronic systolic (congestive) heart failure: Secondary | ICD-10-CM | POA: Diagnosis not present

## 2014-08-03 DIAGNOSIS — M1990 Unspecified osteoarthritis, unspecified site: Secondary | ICD-10-CM | POA: Insufficient documentation

## 2014-08-03 DIAGNOSIS — I4891 Unspecified atrial fibrillation: Secondary | ICD-10-CM | POA: Diagnosis not present

## 2014-08-03 HISTORY — PX: CARDIOVERSION: SHX1299

## 2014-08-03 HISTORY — PX: TEE WITHOUT CARDIOVERSION: SHX5443

## 2014-08-03 SURGERY — ECHOCARDIOGRAM, TRANSESOPHAGEAL
Anesthesia: Monitor Anesthesia Care

## 2014-08-03 MED ORDER — PROPOFOL 10 MG/ML IV BOLUS
INTRAVENOUS | Status: DC | PRN
  Administered 2014-08-03 (×2): 20 mg via INTRAVENOUS

## 2014-08-03 MED ORDER — SODIUM CHLORIDE 0.9 % IV SOLN
INTRAVENOUS | Status: DC
  Administered 2014-08-03: 500 mL via INTRAVENOUS

## 2014-08-03 MED ORDER — PROPOFOL INFUSION 10 MG/ML OPTIME
INTRAVENOUS | Status: DC | PRN
  Administered 2014-08-03: 100 ug/kg/min via INTRAVENOUS

## 2014-08-03 NOTE — Transfer of Care (Signed)
Immediate Anesthesia Transfer of Care Note  Patient: [name]  Procedure(s) Performed: Procedure(s): TRANSESOPHAGEAL ECHOCARDIOGRAM (TEE) (N/A) CARDIOVERSION (N/A)  Patient Location: Endoscopy Unit  Anesthesia Type:MAC  Level of Consciousness: awake and alert   Airway & Oxygen Therapy: Patient Spontanous Breathing and Patient connected to nasal cannula oxygen  Post-op Assessment: Report given to RN, Post -op Vital signs reviewed and stable and Patient moving all extremities X 4  Post vital signs: Reviewed and stable  Last Vitals:  Filed Vitals:   08/03/14 0833  BP: 86/59  Temp:   Resp:   HR 70, RR 14, Sats 93%  Complications: No apparent anesthesia complications

## 2014-08-03 NOTE — H&P (View-Only) (Signed)
Patient ID: [name], male   DOB: 06-24-1929, 81 y.o.   MRN: [id number] PCP: Dr. Yong Channel  81 yo with history of paroxysmal atrial fibrillation, nonischemic cardiomyopathy, and complete heart block with St Jude CRT-P system presents for cardiology followup.  He was admitted in 4/14 with pacemaker pocket infection.  His pacemaker was removed and temporary-permanent device was placed.  Later, he had the temporary-permanent device removed and a new CRT-P device was placed. He developed dyspnea post-operatively and was found to have a large right-sided pleural effusion.  He was admitted and got a chest tube for drainage. He had followup with Dr. Servando Snare and it was decided that he would not need VATS.  He had been on amiodarone for maintenance of NSR.  This had been more successful than Tikosyn.   Around 3/15, he started developing increasing exertional dyspnea.  He was short of breath walking up a hill or incline.  He could still walk on the treadmill for exercise for 10-15 minutes and use the elliptical without much trouble on most days.  No orthopnea or PND.  No chest pain.  He saw Dr Leanne Chang and was started on Lasix three times a week due to concern for volume overload.  This did not seem to have helped much.  After that, he had PFTs done which showed normal spirometry but DLCO 50% predicted.  Therefore, amiodarone was stopped due to concern for lung toxicity.  He saw Richardson Dopp for a pre-operative evaluation prior to right inguinal hernia repair.  Given the exertional dyspnea, he was set up for adenosine Cardiolite in 5/15.  This showed EF 36% with a small area of primarily scar in the apex, apical lateral wall, and mid anterolateral wall.  He has not felt palpitations. Echo in 5/15 showed EF 40-45%, similar to prior study.   I had him see pulmonary for evaluation for amiodarone lung toxicity.  CT chest looked ok, and he was not thought to have amiodarone lung toxicity.   Over the last week, [name] has noted increased fatigue and mild dyspnea.  This is not typical for him.  However, he is still able to do all his exercises.  He is not limited and can walk on flat ground without difficulty. No chest pain.  No orthopnea or PND.  Weight is up 3 lbs from my last appointment with him.  ECG showed atrial fibrillation with controlled rate.   ECG: atrial fibrillation with V-pacing.   Labs (7/13); LDL 65, HDL 38 Labs (3/14): TSH normal Labs (4/14):  ALT 61, AST 62 Labs (5/14): K 4.5, creatinine 0.9, BNP 727, digoxin 0.9 Labs (6/14): LDL 77, HDL 34, K 4, creatinine 0.9, TSH normal Labs (10/14): digoxin 2.1, K 4.7, creatinine 1.0, TSH normal, LFTs normal Labs (3/15): K 4, creatinine 1.1 Labs (4/15): BNP 482 Labs (5/15): K 3.8, creatinine 1.2 Labs (6/15): K 4.3, creatinine 0.9 Labs (7/15): TSH normal, LFTs normal Labs (9/15): TSH normal, LFTs normal Labs (1/16): K 4.3, creatinine 1.07  PMH: 1. Low back pain 2. Hyperlipidemia 3. GERD 4. BPH 5. Polymyalgia rheumatica 6. Atrial fibrillation: Failed Tikosyn in the past.  Paroxysmal, on amiodarone and warfarin.  DCCV in 4/13, 7/13, 10/13.  Has held NSR with amiodarone.  However, concern for amiodarone lung toxicity: PFTs (4/15) with FVC 90% predicted, FEV1 90%, ratio 97%, DLCO 50%.   7. Transaminitis: Mild, attributed to amiodarone.  8. Complete heart block s/p St Jude CRT-P device.  Patient developed PCM pocket infection  in 4/14 and had his first system removed with placement of a temporary permanent PCM.  He later had CRT-P device replaced.  This was complicated by large right-sided hemorrhagic pleural effusion requiring chest tube.   9. Nonischemic cardiomyopathy: Echo (9/13) with EF 40-45%, diffuse HK, mild Craig.  Mild CAD only on prior LHC.  Echo (4/14) with EF 45-50%.  Adenosine Cardiolite (5/15) with EF 36% (visually appeared higher per report), small area of scar with peri-infarct ischemia in the apex, apical lateral wall, and mid  anterolateral wall. Echo (5/15) with EF 40-45%, basal to mid inferolateral severe hypokinesis, basal to mid anterolateral hypokinesis, mildly dilated RV with mildly decreased RV systolic function, mild to moderate Craig.   10. Large right pleural effusion requiring chest tube following removal and reimplantation of PCM. Most recent chest CT in 6/15 showed significant decrease in size of loculated pleural effusion since 5/14.    SH: Married, prior smoker (quit 1985), no ETOH x years, retired, lives at [employer]  [place]: Father with MI at 2, mother with AAA.   ROS: All systems reviewed and negative except as per HPI.   Current Outpatient Prescriptions  Medication Sig Dispense Refill  . carvedilol (COREG) 6.25 MG tablet Take 6.25 mg by mouth 2 (two) times daily with a meal.     . fluticasone (FLONASE) 50 MCG/ACT nasal spray Place 2 sprays into both nostrils daily. 16 g 5  . furosemide (LASIX) 20 MG tablet Take 1 tablet (20 mg total) by mouth daily. 90 tablet 3  . lisinopril (PRINIVIL,ZESTRIL) 2.5 MG tablet Take 1 tablet by mouth  daily 90 tablet 0  . potassium chloride SA (K-DUR,KLOR-CON) 20 MEQ tablet Take 1 tablet (20 mEq total) by mouth daily. 90 tablet 0  . pravastatin (PRAVACHOL) 80 MG tablet Take 40 mg by mouth daily.    . tamsulosin (FLOMAX) 0.4 MG CAPS Take 0.4 mg by mouth daily.     Marland Kitchen warfarin (COUMADIN) 5 MG tablet Take 0.5-1 tablets (2.5-5 mg total) by mouth daily. As directed by anticoagulation clinic 90 tablet 1  . amiodarone (PACERONE) 200 MG tablet Take 1 tablet (200 mg total) by mouth daily. 90 tablet 3   No current facility-administered medications for this visit.    BP 112/52 mmHg  Pulse 95  Ht 5' 5.5" (1.664 m)  Wt 160 lb (72.576 kg)  BMI 26.21 kg/m2 General: NAD Neck: JVP 7 cm, no thyromegaly or thyroid nodule.  Lungs: CTAB  CV: Nondisplaced PMI.  Heart regular S1/S2, no S3/S4, no murmur.  No edema.  No carotid bruit.  Normal pedal pulses.  Abdomen: Soft, nontender, no  hepatosplenomegaly, no distention.  Neurologic: Alert and oriented x 3.  Psych: Normal affect. Extremities: No clubbing or cyanosis.   Assessment/Plan: 1. Chronic systolic CHF: NYHA class II-IIII symptoms with no significant volume overload on exam today.  He has CRT-P.  Last echo in 5/15 with EF 40-45% and wall motion abnormalities, similar to prior echoes.  Presumed nonischemic cardiomyopathy (prior cath with only mild CAD).  He had an adenosine Cardiolite in 5/15 with EF 36% and possible small area of apical and lateral scar with peri-infarct ischemia.  He is symptomatically a little worse, suspect this is due to being back in atrial fibrillation.  - Continue Lasix 20 mg daily with BMET today.  - Continue current Coreg and lisinopril.  He has been unable to tolerate a higher lisinopril dose due to fatigue and BP remains soft today. 2. Atrial fibrillation: Paroxysmal.  He is back in atrial fibrillation today despite amiodarone 100 mg daily.  By symptoms, I suspect he has been in it for several days.  He tolerates atrial fibrillation poorly.  Pacemaker was checked today.  - He was seen by pulmonary and amiodarone lung toxicity is thought to be unlikely.  I am going to increase amiodarone to 200 mg bid x 1 week, then 200 mg daily until he seems me back 3-4 weeks.  We will start checking weekly INRs.  If INR stays > 2, will be able to cardiovert him without TEE (if he does not convert to NSR on his own).  - LFTs/TSH today, will get yearly eye exam.  3. Hyperlipidemia: History of nonobstructive CAD.  Good lipids on pravastatin.    [name] 07/27/2014

## 2014-08-03 NOTE — CV Procedure (Signed)
Procedure: TEE  Sedation: Propofol per anesthesiology  Indication: Atrial fibrillation  Findings: Please see echo section for full report.  Normal left ventricular size with inferolateral and inferior severe hypokinesis.  EF 35-40%.  Normal RV size with mildly decreased systolic function.  Trileaflet aortic valve with mild aortic insufficiency, no aortic stenosis.  There was moderate mitral regurgitation.  Mild TR, peak RV-RA gradient 26 mmHg.  Mild LAE, no LAA thrombus. Mild RAE.  No significant aortic plaque, normal caliber thoracic aorta.    May proceed to DCCV.  [name] 08/03/2014 8:33 AM

## 2014-08-03 NOTE — Progress Notes (Signed)
  Echocardiogram Echocardiogram Transesophageal has been performed.  [name] M 08/03/2014, 8:49 AM

## 2014-08-03 NOTE — Anesthesia Postprocedure Evaluation (Signed)
  Anesthesia Post-op Note  Patient: [name]  Procedure(s) Performed: Procedure(s): TRANSESOPHAGEAL ECHOCARDIOGRAM (TEE) (N/A) CARDIOVERSION (N/A)  Patient Location: PACU and Endoscopy Unit  Anesthesia Type:MAC  Level of Consciousness: awake  Airway and Oxygen Therapy: Patient Spontanous Breathing  Post-op Pain: mild  Post-op Assessment: Post-op Vital signs reviewed  Post-op Vital Signs: Reviewed  Last Vitals:  Filed Vitals:   08/03/14 0913  BP: 91/64  Temp:   Resp:     Complications: No apparent anesthesia complications

## 2014-08-03 NOTE — Discharge Instructions (Signed)

## 2014-08-03 NOTE — Procedures (Addendum)
Electrical Cardioversion Procedure Note [name] [id number] December 18, 1929  Procedure: Electrical Cardioversion Indications:  Atrial Fibrillation.  INR > 2 less than 24 hours ago, TEE showed no LAA thrombus.    Procedure Details Consent: Risks of procedure as well as the alternatives and risks of each were explained to the (patient/caregiver).  Consent for procedure obtained. Time Out: Verified patient identification, verified procedure, site/side was marked, verified correct patient position, special equipment/implants available, medications/allergies/relevent history reviewed, required imaging and test results available.  Performed  Patient placed on cardiac monitor, pulse oximetry, supplemental oxygen as necessary.  Sedation given: Propofol per anesthesiology.  Pacer pads placed anterior and posterior chest.  Cardioverted 1 time(s).  Cardioverted at [place].  Evaluation Findings: Post procedure EKG shows: NSR Complications: None Patient did tolerate procedure well.  St Jude BiV device interrogated post-procedure, functioning normally.    [name] 08/03/2014, 8:33 AM

## 2014-08-03 NOTE — Interval H&P Note (Signed)
History and Physical Interval Note:  08/03/2014 8:15 AM  [name]  has presented today for surgery, with the diagnosis of AFIB  The various methods of treatment have been discussed with the patient and family. After consideration of risks, benefits and other options for treatment, the patient has consented to  Procedure(s): TRANSESOPHAGEAL ECHOCARDIOGRAM (TEE) (N/A) CARDIOVERSION (N/A) as a surgical intervention .  The patient's history has been reviewed, patient examined, no change in status, stable for surgery.  I have reviewed the patient's chart and labs.  Questions were answered to the patient's satisfaction.     Maralee Higuchi [employer]

## 2014-08-03 NOTE — Anesthesia Procedure Notes (Signed)
Procedure Name: MAC Date/Time: 08/03/2014 8:06 AM Performed by: Merrilyn Puma B Pre-anesthesia Checklist: Patient identified, Timeout performed, Emergency Drugs available, Suction available and Patient being monitored Patient Re-evaluated:Patient Re-evaluated prior to inductionOxygen Delivery Method: Nasal cannula Intubation Type: IV induction Placement Confirmation: positive ETCO2 and breath sounds checked- equal and bilateral Dental Injury: Teeth and Oropharynx as per pre-operative assessment

## 2014-08-03 NOTE — Anesthesia Preprocedure Evaluation (Addendum)
Anesthesia Evaluation  Patient identified by MRN, date of birth, ID band Patient awake    Reviewed: Allergy & Precautions, NPO status , Patient's Chart, lab work & pertinent test results  Airway Mallampati: II  TM Distance: >3 FB Neck ROM: Full    Dental  (+) Dental Advisory Given, Teeth Intact   Pulmonary shortness of breath, former smoker,  breath sounds clear to auscultation        Cardiovascular + CAD and +CHF + pacemaker Rhythm:Regular Rate:Normal     Neuro/Psych    GI/Hepatic Neg liver ROS, GERD-  ,  Endo/Other  negative endocrine ROS  Renal/GU negative Renal ROS     Musculoskeletal  (+) Arthritis -,   Abdominal   Peds  Hematology   Anesthesia Other Findings   Reproductive/Obstetrics                           Anesthesia Physical Anesthesia Plan  ASA: III  Anesthesia Plan: MAC   Post-op Pain Management:    Induction: Intravenous  Airway Management Planned: Nasal Cannula  Additional Equipment:   Intra-op Plan:   Post-operative Plan:   Informed Consent: I have reviewed the patients History and Physical, chart, labs and discussed the procedure including the risks, benefits and alternatives for the proposed anesthesia with the patient or authorized representative who has indicated his/her understanding and acceptance.   Dental advisory given  Plan Discussed with: Anesthesiologist, Surgeon and CRNA  Anesthesia Plan Comments:        Anesthesia Quick Evaluation

## 2014-08-07 ENCOUNTER — Encounter: Payer: Self-pay | Admitting: Internal Medicine

## 2014-08-07 ENCOUNTER — Encounter (HOSPITAL_COMMUNITY): Payer: Self-pay | Admitting: Cardiology

## 2014-08-07 ENCOUNTER — Ambulatory Visit (INDEPENDENT_AMBULATORY_CARE_PROVIDER_SITE_OTHER): Payer: Medicare Other | Admitting: Internal Medicine

## 2014-08-07 DIAGNOSIS — I480 Paroxysmal atrial fibrillation: Secondary | ICD-10-CM | POA: Diagnosis not present

## 2014-08-07 DIAGNOSIS — I5022 Chronic systolic (congestive) heart failure: Secondary | ICD-10-CM | POA: Diagnosis not present

## 2014-08-07 DIAGNOSIS — I495 Sick sinus syndrome: Secondary | ICD-10-CM | POA: Diagnosis not present

## 2014-08-07 DIAGNOSIS — I2510 Atherosclerotic heart disease of native coronary artery without angina pectoris: Secondary | ICD-10-CM | POA: Diagnosis not present

## 2014-08-07 LAB — CUP PACEART INCLINIC DEVICE CHECK
Battery Remaining Longevity: 82.8 mo
Battery Voltage: 2.98 V
Brady Statistic RA Percent Paced: 99 %
Brady Statistic RV Percent Paced: 97 %
Date Time Interrogation Session: 20160531131756
Lead Channel Impedance Value: 487.5 Ohm
Lead Channel Pacing Threshold Amplitude: 0.75 V
Lead Channel Pacing Threshold Pulse Width: 0.4 ms
Lead Channel Pacing Threshold Pulse Width: 0.5 ms
Lead Channel Pacing Threshold Pulse Width: 0.5 ms
Lead Channel Sensing Intrinsic Amplitude: 2.1 mV
Lead Channel Setting Pacing Amplitude: 2 V
Lead Channel Setting Pacing Pulse Width: 0.4 ms
MDC IDC MSMT LEADCHNL LV IMPEDANCE VALUE: 725 Ohm
MDC IDC MSMT LEADCHNL LV PACING THRESHOLD AMPLITUDE: 1 V
MDC IDC MSMT LEADCHNL RV IMPEDANCE VALUE: 412.5 Ohm
MDC IDC MSMT LEADCHNL RV PACING THRESHOLD AMPLITUDE: 0.75 V
MDC IDC MSMT LEADCHNL RV SENSING INTR AMPL: 9.4 mV
MDC IDC SET LEADCHNL LV PACING PULSEWIDTH: 0.5 ms
MDC IDC SET LEADCHNL RA PACING AMPLITUDE: 2 V
MDC IDC SET LEADCHNL RV PACING AMPLITUDE: 2.5 V
MDC IDC SET LEADCHNL RV SENSING SENSITIVITY: 2.5 mV
Pulse Gen Model: 3210
Pulse Gen Serial Number: 2926516

## 2014-08-07 NOTE — Assessment & Plan Note (Signed)
He c/o being sob after his DCCV. I have asked the patient to take an additional lasix for 3 days. He is encouraged to maintain a low sodium diet. He has not previously.

## 2014-08-07 NOTE — Assessment & Plan Note (Signed)
He is s/p DCCV maintaining NSR. He will continue amio 200 mg daily.

## 2014-08-07 NOTE — Patient Instructions (Addendum)
Medication Instructions:  Your physician recommends that you continue on your current medications as directed. Please refer to the Current Medication list given to you today.  For the next 3 days take 3 tablets (60mg ) of your Furosemide and the go back to 2 tablets (40mg ) daily   Labwork: Your physician recommends that you return for lab work in 3 weeks with a BMP/MAG   Testing/Procedures: None ordered  Follow-Up: Your physician wants you to follow-up in: 6 months with Dr Knox Saliva will receive a reminder letter in the mail two months in advance. If you don't receive a letter, please call our office to schedule the follow-up appointment.  Remote monitoring is used to monitor your Pacemaker of ICD from home. This monitoring reduces the number of office visits required to check your device to one time per year. It allows [place] to keep an eye on the functioning of your device to ensure it is working properly. You are scheduled for a device check from home on 11/06/14. You may send your transmission at any time that day. If you have a wireless device, the transmission will be sent automatically. After your physician reviews your transmission, you will receive a postcard with your next transmission date.    Any Other Special Instructions Will Be Listed Below (If Applicable).

## 2014-08-07 NOTE — Progress Notes (Signed)
HPI [name] returns today for followup. He is a very pleasant 81 year old man with chronic systolic heart failure, symptomatic bradycardia, high-grade conduction system disease, who developed a pacemaker pocket infection and underwent extraction, initial insertion of a temporary permanent pacemaker, followed by insertion of a biventricular pacemaker. His procedure was complicated by atrial lead perforation x2, resulting in a pleural effusion. He has had atrial fib on low dose amiodarone and has undergone DCCV 4 days ago. Since then he remains a bit sob. No syncope or palpitations. Allergies  Allergen Reactions  . Antihistamines, Diphenhydramine-Type Other (See Comments)    Causes difficulty in ability to urinate.     Current Outpatient Prescriptions  Medication Sig Dispense Refill  . amiodarone (PACERONE) 200 MG tablet Take 200 mg by mouth daily.    . carvedilol (COREG) 6.25 MG tablet Take 6.25 mg by mouth 2 (two) times daily with a meal.     . fluticasone (FLONASE) 50 MCG/ACT nasal spray Place 2 sprays into both nostrils daily. 16 g 5  . furosemide (LASIX) 20 MG tablet Take 40 mg by mouth daily.    Marland Kitchen lisinopril (PRINIVIL,ZESTRIL) 2.5 MG tablet Take 1 tablet by mouth  daily 90 tablet 0  . potassium chloride SA (K-DUR,KLOR-CON) 20 MEQ tablet Take 40 mEq by mouth daily.     . pravastatin (PRAVACHOL) 80 MG tablet Take 40 mg by mouth daily.    . tamsulosin (FLOMAX) 0.4 MG CAPS Take 0.4 mg by mouth daily.     Marland Kitchen warfarin (COUMADIN) 5 MG tablet Take 0.5-1 tablets (2.5-5 mg total) by mouth daily. As directed by anticoagulation clinic 90 tablet 1   No current facility-administered medications for this visit.     Past Medical History  Diagnosis Date  . Coronary artery disease   . Cardiomyopathy primary-nonischemic EF 45%  . Hyperlipidemia   . GERD (gastroesophageal reflux disease)   . Polymyalgia rheumatica   . Sick sinus syndrome   . Atrial fibrillation   . Dysphagia   . Hearing loss,  mixed, bilateral   . Increased prostate specific antigen (PSA) velocity   . Lumbar back pain   . BPH (benign prostatic hypertrophy)   . Breast mass     "on both sides" (06/28/2012)  . CHF (congestive heart failure)   . Elevated liver enzymes   . Esophageal stricture   . Pacemaker infection     06/28/12  . Hx of cardiovascular stress test     Adenosine Myoview (07/2013):  Apical cap, apical lateral and mid anterolateral scar, small amount of peri-infarct ischemia, EF 36%; Medium Risk  . Pacemaker     st jude  . Diverticulosis of colon without hemorrhage 01/03/2014  . Internal hemorrhoids 01/03/2014  . Arthritis     "back" (03/20/2014)    ROS:   All systems reviewed and negative except as noted in the HPI.   Past Surgical History  Procedure Laterality Date  . Cataract extraction, bilateral Bilateral 1980's  . Umbilical hernia repair  [id number] X 3  . Insert / replace / remove pacemaker    . Cardioversion  06/17/2011    Procedure: CARDIOVERSION;  Surgeon: Lelon Perla, MD;  Location: [place];  Service: Cardiovascular;  Laterality: N/A;  . Cardioversion  09/09/2011    Procedure: CARDIOVERSION;  Surgeon: Carlena Bjornstad, MD;  Location: [place];  Service: Cardiovascular;  Laterality: N/A;  . Cardioversion  01/01/2012    Procedure: CARDIOVERSION;  Surgeon: Hillary Bow, MD;  Location: [facility] ENDOSCOPY;  Service: Cardiovascular;  Laterality: N/A;  . Tee without cardioversion  01/01/2012    Procedure: TRANSESOPHAGEAL ECHOCARDIOGRAM (TEE);  Surgeon: Fay Records, MD;  Location: [facility] ENDOSCOPY;  Service: Cardiovascular;  Laterality: N/A;  . Generator removal Left 06/15/2012    Procedure: GENERATOR REMOVAL;  Surgeon: Evans Lance, MD;  Location: [place];  Service: Cardiovascular;  Laterality: Left;  . Pacemaker generator change N/A 06/15/2012    Procedure: PACEMAKER GENERATOR CHANGE;  Surgeon: Evans Lance, MD;  Location: [place];  Service: Cardiovascular;  Laterality: N/A;  . Tonsillectomy and  adenoidectomy  1938  . Esophagogastroduodenoscopy    . Colonoscopy N/A 01/03/2014    Procedure: COLONOSCOPY;  Surgeon: Gatha Mayer, MD;  Location: [facility] ENDOSCOPY;  Service: Endoscopy;  Laterality: N/A;  . Permanent pacemaker insertion N/A 06/28/2012    Procedure: PERMANENT PACEMAKER INSERTION;  Surgeon: Evans Lance, MD;  Location: [facility] CATH LAB;  Service: Cardiovascular;  Laterality: N/A;  . Lead revision N/A 06/29/2012    Procedure: LEAD REVISION;  Surgeon: Deboraha Sprang, MD;  Location: [facility] CATH LAB;  Service: Cardiovascular;  Laterality: N/A;  . Pacemaker revision  06/30/2012    Procedure: PACEMAKER LEAD REVISION;  Surgeon: Evans Lance, MD;  Location: [facility] CATH LAB;  Service: Cardiovascular;;  . Cardiac catheterization  1980's    [place]  . Paracentesis  ~ 03/313    complication from pacer change in 2014  . Inguinal hernia repair Right 2012; 03/20/2014  . Breast surgery    . Inguinal hernia repair Right 03/20/2014    Procedure: OPEN REPAIR OF RIGHT INGUINAL WITH MESH;  Surgeon: Donnie Mesa, MD;  Location: [place];  Service: General;  Laterality: Right;  . Insertion of mesh Right 03/20/2014    Procedure: INSERTION OF MESH;  Surgeon: Donnie Mesa, MD;  Location: [place];  Service: General;  Laterality: Right;  . Tee without cardioversion N/A 08/03/2014    Procedure: TRANSESOPHAGEAL ECHOCARDIOGRAM (TEE);  Surgeon: Larey Dresser, MD;  Location: [place];  Service: Cardiovascular;  Laterality: N/A;  . Cardioversion N/A 08/03/2014    Procedure: CARDIOVERSION;  Surgeon: Larey Dresser, MD;  Location: [facility] ENDOSCOPY;  Service: Cardiovascular;  Laterality: N/A;     Family History  Problem Relation Age of Onset  . Heart attack Father 60    deceased  . AAA (abdominal aortic aneurysm) Mother     deceased AAA  . Hypertension Mother   . Other Brother     deceased at birth     History   Social History  . Marital Status: Married    Spouse Name: N/A  . Number of Children: 3  . Years of  Education: N/A   Occupational History  . retired     Engineering geologist   Social History Main Topics  . Smoking status: Former Smoker -- 1.00 packs/day for 40 years    Types: Cigarettes    Quit date: 03/10/1983  . Smokeless tobacco: Never Used  . Alcohol Use: 0.0 oz/week    0 Standard drinks or equivalent per week     Comment: quit drinking 2000-? alcohol related cardiomyopathy  . Drug Use: No  . Sexual Activity: Not Currently   Other Topics Concern  . Not on file   Social History Narrative   Married. 3 children (6 together). 5 grandkids with with 5 grandkids (2nd marriage). No greatgrandkids.       Retired from [employer]      Hobbies: play golf, bridge, garden, write  family stories     BP 90/58 mmHg  Pulse 70  Ht 5\' 6"  (1.676 m)  Wt 158 lb 6.4 oz (71.85 kg)  BMI 25.58 kg/m2  Physical Exam:  Well appearing  Elderly man, NAD HEENT: Unremarkable Neck:  7 cm JVD, no thyromegally Back:  No CVA tenderness Lungs:  Clear with no wheezes, rales, or rhonchi. Well-healed pacemaker incision. Well-healed extraction incision. HEART:  Regular rate rhythm, no murmurs, no rubs, no clicks Abd:  soft, positive bowel sounds, no organomegally, no rebound, no guarding Ext:  2 plus pulses, trace edema, no cyanosis, no clubbing Skin:  No rashes no nodules Neuro:  CN II through XII intact, motor grossly intact   DEVICE  Normal device function except for atrial undersensing of atrial fib.  See PaceArt for details.   Assess/Plan:

## 2014-08-07 NOTE — Assessment & Plan Note (Signed)
He denies anginal symptoms. He will continue his current meds.  

## 2014-08-07 NOTE — Assessment & Plan Note (Signed)
His St. Jude DDD/BiV PM is working normally except for mild atrial undersensing. Will follow. Will recheck in several months.

## 2014-08-10 ENCOUNTER — Ambulatory Visit (INDEPENDENT_AMBULATORY_CARE_PROVIDER_SITE_OTHER): Payer: Medicare Other

## 2014-08-10 DIAGNOSIS — I4891 Unspecified atrial fibrillation: Secondary | ICD-10-CM

## 2014-08-10 DIAGNOSIS — Z5181 Encounter for therapeutic drug level monitoring: Secondary | ICD-10-CM | POA: Diagnosis not present

## 2014-08-10 DIAGNOSIS — I480 Paroxysmal atrial fibrillation: Secondary | ICD-10-CM

## 2014-08-10 LAB — POCT INR: INR: 2.7

## 2014-08-13 ENCOUNTER — Other Ambulatory Visit: Payer: Self-pay

## 2014-08-13 DIAGNOSIS — I480 Paroxysmal atrial fibrillation: Secondary | ICD-10-CM

## 2014-08-13 MED ORDER — FUROSEMIDE 20 MG PO TABS: 40.0000 mg | ORAL_TABLET | Freq: Every day | ORAL | Status: DC

## 2014-08-13 MED ORDER — POTASSIUM CHLORIDE CRYS ER 20 MEQ PO TBCR: 40.0000 meq | EXTENDED_RELEASE_TABLET | Freq: Every day | ORAL | Status: DC

## 2014-08-17 ENCOUNTER — Telehealth: Payer: Self-pay | Admitting: Internal Medicine

## 2014-08-17 MED ORDER — AMIODARONE HCL 200 MG PO TABS: 200.0000 mg | ORAL_TABLET | Freq: Every day | ORAL | Status: DC

## 2014-08-17 NOTE — Telephone Encounter (Signed)
Rx sent in. °Patient aware °

## 2014-08-17 NOTE — Telephone Encounter (Signed)
New message      Pt says he needs a new presc called in to [facility] for amiodarone 200mg .  He said his dosage was changed from 100mg  to 200mg .  Please call him and let him know he is to still stay on 200mg 

## 2014-08-23 ENCOUNTER — Encounter: Payer: Self-pay | Admitting: Cardiology

## 2014-08-23 ENCOUNTER — Ambulatory Visit (INDEPENDENT_AMBULATORY_CARE_PROVIDER_SITE_OTHER): Payer: Medicare Other | Admitting: *Deleted

## 2014-08-23 ENCOUNTER — Ambulatory Visit (INDEPENDENT_AMBULATORY_CARE_PROVIDER_SITE_OTHER): Payer: Medicare Other | Admitting: Cardiology

## 2014-08-23 VITALS — BP 110/44 | HR 81 | Ht 66.0 in | Wt 156.1 lb

## 2014-08-23 DIAGNOSIS — I480 Paroxysmal atrial fibrillation: Secondary | ICD-10-CM

## 2014-08-23 DIAGNOSIS — Z5181 Encounter for therapeutic drug level monitoring: Secondary | ICD-10-CM

## 2014-08-23 DIAGNOSIS — Z950 Presence of cardiac pacemaker: Secondary | ICD-10-CM

## 2014-08-23 DIAGNOSIS — I4891 Unspecified atrial fibrillation: Secondary | ICD-10-CM

## 2014-08-23 DIAGNOSIS — I5022 Chronic systolic (congestive) heart failure: Secondary | ICD-10-CM

## 2014-08-23 LAB — POCT INR: INR: 2

## 2014-08-23 MED ORDER — SPIRONOLACTONE 25 MG PO TABS: 12.5000 mg | ORAL_TABLET | Freq: Every day | ORAL | Status: DC

## 2014-08-23 MED ORDER — POTASSIUM CHLORIDE CRYS ER 20 MEQ PO TBCR
20.0000 meq | EXTENDED_RELEASE_TABLET | Freq: Every day | ORAL | Status: DC
Start: 2014-08-23 — End: 2015-03-14

## 2014-08-23 NOTE — Progress Notes (Signed)
Patient ID: [name], male   DOB: Oct 26, 1929, 81 y.o.   MRN: [id number] PCP: Dr. Yong Channel  81 yo with history of paroxysmal atrial fibrillation, nonischemic cardiomyopathy, and complete heart block with St Jude CRT-P system presents for cardiology followup.  He was admitted in 4/14 with pacemaker pocket infection.  His pacemaker was removed and temporary-permanent device was placed.  Later, he had the temporary-permanent device removed and a new CRT-P device was placed. He developed dyspnea post-operatively and was found to have a large right-sided pleural effusion.  He was admitted and got a chest tube for drainage. He had followup with Dr. Servando Snare and it was decided that he would not need VATS.  He had been on amiodarone for maintenance of NSR.  This had been more successful than Tikosyn.   Around 3/15, he started developing increasing exertional dyspnea.  He was short of breath walking up a hill or incline.  He could still walk on the treadmill for exercise for 10-15 minutes and use the elliptical without much trouble on most days.  No orthopnea or PND.  No chest pain.  He saw Dr Leanne Chang and was started on Lasix three times a week due to concern for volume overload.  This did not seem to have helped much.  After that, he had PFTs done which showed normal spirometry but DLCO 50% predicted.  Therefore, amiodarone was stopped due to concern for lung toxicity.  He saw Richardson Dopp for a pre-operative evaluation prior to right inguinal hernia repair.  Given the exertional dyspnea, he was set up for adenosine Cardiolite in 5/15.  This showed EF 36% with a small area of primarily scar in the apex, apical lateral wall, and mid anterolateral wall.  He has not felt palpitations. Echo in 5/15 showed EF 40-45%, similar to prior study.   I had him see pulmonary for evaluation for amiodarone lung toxicity.  CT chest looked ok, and he was not thought to have amiodarone lung toxicity.   At last appointment, Craig  [name] was noted to be back in atrial fibrillation.  He was more short of breath with exertion and fatigued.  He has historically tolerated atrial fibrillation poorly.  I did a TEE-guided DCCV in 5/16 back to NSR.  TEE showed EF 35-40%.  He did not immediately feel better like he has in the past post-DCCV, so Lasix was increased.  He is currently taking 40 mg daily.  Weight is down 4 lbs.  He now feels back to normal.  No dyspnea walking on flat ground or going up steps.  No chest pain.  No palpitations or lightheadedness.   ECG: a-biv sequential pacing  Labs (7/13); LDL 65, HDL 38 Labs (3/14): TSH normal Labs (4/14):  ALT 61, AST 62 Labs (5/14): K 4.5, creatinine 0.9, BNP 727, digoxin 0.9 Labs (6/14): LDL 77, HDL 34, K 4, creatinine 0.9, TSH normal Labs (10/14): digoxin 2.1, K 4.7, creatinine 1.0, TSH normal, LFTs normal Labs (3/15): K 4, creatinine 1.1 Labs (4/15): BNP 482 Labs (5/15): K 3.8, creatinine 1.2 Labs (6/15): K 4.3, creatinine 0.9 Labs (7/15): TSH normal, LFTs normal Labs (9/15): TSH normal, LFTs normal Labs (1/16): K 4.3, creatinine 1.07 Labs (5/16): TSH normal, LFTs normal, K 4.4, creatinine 1.02  PMH: 1. Low back pain 2. Hyperlipidemia 3. GERD 4. BPH 5. Polymyalgia rheumatica 6. Atrial fibrillation: Failed Tikosyn in the past.  Paroxysmal, on amiodarone and warfarin.  DCCV in 4/13, 7/13, 10/13.  Has held NSR with amiodarone.  However, concern for amiodarone lung toxicity: PFTs (4/15) with FVC 90% predicted, FEV1 90%, ratio 97%, DLCO 50%.   Recurrence of atrial fibrillation in 5/16, TEE-guided DCCV back to NSR in 5/16.  7. Transaminitis: Mild, attributed to amiodarone.  8. Complete heart block s/p St Jude CRT-P device.  Patient developed PCM pocket infection in 4/14 and had his first system removed with placement of a temporary permanent PCM.  He later had CRT-P device replaced.  This was complicated by large right-sided hemorrhagic pleural effusion requiring chest tube.    9. Nonischemic cardiomyopathy: Echo (9/13) with EF 40-45%, diffuse HK, mild Craig.  Mild CAD only on prior LHC.  Echo (4/14) with EF 45-50%.  Adenosine Cardiolite (5/15) with EF 36% (visually appeared higher per report), small area of scar with peri-infarct ischemia in the apex, apical lateral wall, and mid anterolateral wall. Echo (5/15) with EF 40-45%, basal to mid inferolateral severe hypokinesis, basal to mid anterolateral hypokinesis, mildly dilated RV with mildly decreased RV systolic function, mild to moderate Craig.  TEE (5/16) with EF 35-40%, inferior and inferolateral severe hypokinesis, normal RV size with mildly decreased systolic function, moderate Craig, peak RV-RA gradient 26 mmHg.  10. Large right pleural effusion requiring chest tube following removal and reimplantation of PCM. Most recent chest CT in 6/15 showed significant decrease in size of loculated pleural effusion since 5/14.    SH: Married, prior smoker (quit 1985), no ETOH x years, retired, lives at [facility]  [place]: Father with MI at 110, mother with AAA.   ROS: All systems reviewed and negative except as per HPI.   Current Outpatient Prescriptions  Medication Sig Dispense Refill  . amiodarone (PACERONE) 200 MG tablet Take 1 tablet (200 mg total) by mouth daily. 90 tablet 3  . carvedilol (COREG) 6.25 MG tablet Take 6.25 mg by mouth 2 (two) times daily with a meal.     . fluticasone (FLONASE) 50 MCG/ACT nasal spray Place 2 sprays into both nostrils daily. 16 g 5  . furosemide (LASIX) 20 MG tablet Take 2 tablets (40 mg total) by mouth daily. 90 tablet 3  . lisinopril (PRINIVIL,ZESTRIL) 2.5 MG tablet Take 1 tablet by mouth  daily 90 tablet 0  . potassium chloride SA (K-DUR,KLOR-CON) 20 MEQ tablet Take 1 tablet (20 mEq total) by mouth daily.    . pravastatin (PRAVACHOL) 80 MG tablet Take 40 mg by mouth daily.    . tamsulosin (FLOMAX) 0.4 MG CAPS Take 0.4 mg by mouth daily.     Marland Kitchen warfarin (COUMADIN) 5 MG tablet Take 0.5-1 tablets  (2.5-5 mg total) by mouth daily. As directed by anticoagulation clinic 90 tablet 1  . spironolactone (ALDACTONE) 25 MG tablet Take 0.5 tablets (12.5 mg total) by mouth daily. 15 tablet 5   No current facility-administered medications for this visit.    BP 110/44 mmHg  Pulse 81  Ht 5\' 6"  (1.676 m)  Wt 156 lb 1.9 oz (70.816 kg)  BMI 25.21 kg/m2 General: NAD Neck: JVP 7 cm, no thyromegaly or thyroid nodule.  Lungs: CTAB  CV: Nondisplaced PMI.  Heart regular S1/S2, no S3/S4, no murmur.  Trace ankle edema.  No carotid bruit.  Normal pedal pulses.  Abdomen: Soft, nontender, no hepatosplenomegaly, no distention.  Neurologic: Alert and oriented x 3.  Psych: Normal affect. Extremities: No clubbing or cyanosis.   Assessment/Plan: 1. Chronic systolic CHF: NYHA class II symptoms with no significant volume overload on exam today.  He has CRT-P.  TEE in 5/16 showed  EF 35-40% with wall motion abnormalities. Presumed nonischemic cardiomyopathy (prior cath with only mild CAD).  He had an adenosine Cardiolite in 5/15 with EF 36% and possible small area of apical and lateral scar with peri-infarct ischemia.  He is feeling better back in NSR and on higher Lasix dose.   - Continue Lasix 40 mg daily  - Continue current Coreg and lisinopril.  He has been unable to tolerate a higher lisinopril dose due to fatigue. - I will start spironolactone 12.5 mg daily and decrease KCl to 20 daily.  BMET in 10 days.  2. Atrial fibrillation: Paroxysmal.  Back in NSR.  Continue amiodarone and warfarin.  - LFTs and TSH were normal recently, will get yearly eye exam.  3. Hyperlipidemia: History of nonobstructive CAD.  Good lipids on pravastatin.    Followup in 6 wks in CHF clinic.   [name] 08/23/2014

## 2014-08-23 NOTE — Patient Instructions (Addendum)
Medication Instructions:  Your physician has recommended you make the following change in your medication:  1) START Spironolactone to 12.5mg  daily 2) REDUCE Potassium to 64meq daily  Labwork: Your physician recommends that you return for lab work in: 10 days (Bmet) To be drawn at [facility] office  Testing/Procedures: None   Follow-Up: Your physician recommends that you schedule a follow-up appointment in: 6 weeks with Dr.McLean only @ the [facility]F clininc   Any Other Special Instructions Will Be Listed Below (If Applicable).

## 2014-08-26 ENCOUNTER — Other Ambulatory Visit: Payer: Self-pay | Admitting: Cardiology

## 2014-08-30 NOTE — Telephone Encounter (Signed)
error 

## 2014-09-03 ENCOUNTER — Other Ambulatory Visit (INDEPENDENT_AMBULATORY_CARE_PROVIDER_SITE_OTHER): Payer: Medicare Other

## 2014-09-03 DIAGNOSIS — I480 Paroxysmal atrial fibrillation: Secondary | ICD-10-CM

## 2014-09-03 LAB — BASIC METABOLIC PANEL
BUN: 33 mg/dL — AB (ref 6–23)
CO2: 27 meq/L (ref 19–32)
Calcium: 9.2 mg/dL (ref 8.4–10.5)
Chloride: 102 mEq/L (ref 96–112)
Creatinine, Ser: 1.29 mg/dL (ref 0.40–1.50)
GFR: 56.49 mL/min — AB (ref >60.00)
GLUCOSE: 104 mg/dL — AB (ref 70–99)
POTASSIUM: 4.4 meq/L (ref 3.5–5.1)
SODIUM: 134 meq/L — AB (ref 135–145)

## 2014-09-11 ENCOUNTER — Other Ambulatory Visit: Payer: Self-pay | Admitting: *Deleted

## 2014-09-11 MED ORDER — PRAVASTATIN SODIUM 80 MG PO TABS: 40.0000 mg | ORAL_TABLET | Freq: Every day | ORAL | Status: DC

## 2014-09-13 ENCOUNTER — Ambulatory Visit (INDEPENDENT_AMBULATORY_CARE_PROVIDER_SITE_OTHER): Payer: Medicare Other | Admitting: *Deleted

## 2014-09-13 DIAGNOSIS — I480 Paroxysmal atrial fibrillation: Secondary | ICD-10-CM | POA: Diagnosis not present

## 2014-09-13 DIAGNOSIS — Z5181 Encounter for therapeutic drug level monitoring: Secondary | ICD-10-CM | POA: Diagnosis not present

## 2014-09-13 DIAGNOSIS — I4891 Unspecified atrial fibrillation: Secondary | ICD-10-CM | POA: Diagnosis not present

## 2014-09-13 LAB — POCT INR: INR: 3.8

## 2014-09-20 ENCOUNTER — Ambulatory Visit (HOSPITAL_COMMUNITY)
Admission: RE | Admit: 2014-09-20 | Discharge: 2014-09-20 | Disposition: A | Discharge status: Home / Self Care | Payer: Medicare Other | Source: Ambulatory Visit | Attending: Cardiology | Admitting: Cardiology

## 2014-09-20 ENCOUNTER — Encounter (HOSPITAL_COMMUNITY): Payer: Self-pay

## 2014-09-20 VITALS — BP 102/58 | HR 81 | Wt 154.5 lb

## 2014-09-20 DIAGNOSIS — Z87891 Personal history of nicotine dependence: Secondary | ICD-10-CM | POA: Diagnosis not present

## 2014-09-20 DIAGNOSIS — K219 Gastro-esophageal reflux disease without esophagitis: Secondary | ICD-10-CM | POA: Insufficient documentation

## 2014-09-20 DIAGNOSIS — I442 Atrioventricular block, complete: Secondary | ICD-10-CM | POA: Insufficient documentation

## 2014-09-20 DIAGNOSIS — I480 Paroxysmal atrial fibrillation: Secondary | ICD-10-CM

## 2014-09-20 DIAGNOSIS — E785 Hyperlipidemia, unspecified: Secondary | ICD-10-CM | POA: Diagnosis not present

## 2014-09-20 DIAGNOSIS — Z8249 Family history of ischemic heart disease and other diseases of the circulatory system: Secondary | ICD-10-CM | POA: Insufficient documentation

## 2014-09-20 DIAGNOSIS — I5022 Chronic systolic (congestive) heart failure: Secondary | ICD-10-CM

## 2014-09-20 DIAGNOSIS — M353 Polymyalgia rheumatica: Secondary | ICD-10-CM | POA: Insufficient documentation

## 2014-09-20 DIAGNOSIS — Z79899 Other long term (current) drug therapy: Secondary | ICD-10-CM | POA: Insufficient documentation

## 2014-09-20 DIAGNOSIS — Z950 Presence of cardiac pacemaker: Secondary | ICD-10-CM | POA: Insufficient documentation

## 2014-09-20 DIAGNOSIS — Z7901 Long term (current) use of anticoagulants: Secondary | ICD-10-CM | POA: Insufficient documentation

## 2014-09-20 DIAGNOSIS — I2510 Atherosclerotic heart disease of native coronary artery without angina pectoris: Secondary | ICD-10-CM | POA: Diagnosis not present

## 2014-09-20 DIAGNOSIS — I429 Cardiomyopathy, unspecified: Secondary | ICD-10-CM | POA: Insufficient documentation

## 2014-09-20 MED ORDER — SPIRONOLACTONE 25 MG PO TABS: 12.5000 mg | ORAL_TABLET | Freq: Every evening | ORAL | Status: DC

## 2014-09-20 NOTE — Progress Notes (Signed)
Patient ID: [name], male   DOB: 19-Mar-1929, 81 y.o.   MRN: [id number] PCP: Dr. Yong Channel  81 yo with history of paroxysmal atrial fibrillation, nonischemic cardiomyopathy, and complete heart block with St Jude CRT-P system presents for cardiology followup.  He was admitted in 4/14 with pacemaker pocket infection.  His pacemaker was removed and temporary-permanent device was placed.  Later, he had the temporary-permanent device removed and a new CRT-P device was placed. He developed dyspnea post-operatively and was found to have a large right-sided pleural effusion.  He was admitted and got a chest tube for drainage. He had followup with Dr. Servando Snare and it was decided that he would not need VATS.  He had been on amiodarone for maintenance of NSR.  This had been more successful than Tikosyn.   Around 3/15, he started developing increasing exertional dyspnea.  He was short of breath walking up a hill or incline.  He could still walk on the treadmill for exercise for 10-15 minutes and use the elliptical without much trouble on most days.  No orthopnea or PND.  No chest pain.  He saw Dr Leanne Chang and was started on Lasix three times a week due to concern for volume overload.  This did not seem to have helped much.  After that, he had PFTs done which showed normal spirometry but DLCO 50% predicted.  Therefore, amiodarone was stopped due to concern for lung toxicity.  He saw Richardson Dopp for a pre-operative evaluation prior to right inguinal hernia repair.  Given the exertional dyspnea, he was set up for adenosine Cardiolite in 5/15.  This showed EF 36% with a small area of primarily scar in the apex, apical lateral wall, and mid anterolateral wall.  He has not felt palpitations. Echo in 5/15 showed EF 40-45%, similar to prior study.   I had him see pulmonary for evaluation for amiodarone lung toxicity.  CT chest looked ok, and he was not thought to have amiodarone lung toxicity.   At a prior appointment this  year, [name] was noted to be back in atrial fibrillation.  He was more short of breath with exertion and fatigued.  He has historically tolerated atrial fibrillation poorly.  I did a TEE-guided DCCV in 5/16 back to NSR.  TEE showed EF 35-40%.  He did not immediately feel better like he has in the past post-DCCV, so Lasix was increased.  He is currently taking 40 mg daily.  He now feels back to normal.  No dyspnea walking on flat ground or going up steps.  No chest pain.  No palpitations or lightheadedness.  He remains out of atrial fibrillation today.  At last visit, spironolactone was added.  He took it in the morning and developed mild lightheadedness so stopped it.   ECG: a-biv sequential pacing  Labs (7/13); LDL 65, HDL 38 Labs (3/14): TSH normal Labs (4/14):  ALT 61, AST 62 Labs (5/14): K 4.5, creatinine 0.9, BNP 727, digoxin 0.9 Labs (6/14): LDL 77, HDL 34, K 4, creatinine 0.9, TSH normal Labs (10/14): digoxin 2.1, K 4.7, creatinine 1.0, TSH normal, LFTs normal Labs (3/15): K 4, creatinine 1.1 Labs (4/15): BNP 482 Labs (5/15): K 3.8, creatinine 1.2 Labs (6/15): K 4.3, creatinine 0.9 Labs (7/15): TSH normal, LFTs normal Labs (9/15): TSH normal, LFTs normal Labs (1/16): K 4.3, creatinine 1.07 Labs (5/16): TSH normal, LFTs normal, K 4.4, creatinine 1.02 Labs (6/16): K 4.4, creatinine 1.29  PMH: 1. Low back pain 2. Hyperlipidemia 3.  GERD 4. BPH 5. Polymyalgia rheumatica 6. Atrial fibrillation: Failed Tikosyn in the past.  Paroxysmal, on amiodarone and warfarin.  DCCV in 4/13, 7/13, 10/13.  Has held NSR with amiodarone.  However, concern for amiodarone lung toxicity: PFTs (4/15) with FVC 90% predicted, FEV1 90%, ratio 97%, DLCO 50%.   Recurrence of atrial fibrillation in 5/16, TEE-guided DCCV back to NSR in 5/16.  7. Transaminitis: Mild, attributed to amiodarone.  8. Complete heart block s/p St Jude CRT-P device.  Patient developed PCM pocket infection in 4/14 and had his first  system removed with placement of a temporary permanent PCM.  He later had CRT-P device replaced.  This was complicated by large right-sided hemorrhagic pleural effusion requiring chest tube.   9. Nonischemic cardiomyopathy: Echo (9/13) with EF 40-45%, diffuse HK, mild Craig.  Mild CAD only on prior LHC.  Echo (4/14) with EF 45-50%.  Adenosine Cardiolite (5/15) with EF 36% (visually appeared higher per report), small area of scar with peri-infarct ischemia in the apex, apical lateral wall, and mid anterolateral wall. Echo (5/15) with EF 40-45%, basal to mid inferolateral severe hypokinesis, basal to mid anterolateral hypokinesis, mildly dilated RV with mildly decreased RV systolic function, mild to moderate Craig.  TEE (5/16) with EF 35-40%, inferior and inferolateral severe hypokinesis, normal RV size with mildly decreased systolic function, moderate Craig, peak RV-RA gradient 26 mmHg.  10. Large right pleural effusion requiring chest tube following removal and reimplantation of PCM. Most recent chest CT in 6/15 showed significant decrease in size of loculated pleural effusion since 5/14.    SH: Married, prior smoker (quit 1985), no ETOH x years, retired, lives at [employer]  [place]: Father with MI at 4, mother with AAA.   ROS: All systems reviewed and negative except as per HPI.   Current Outpatient Prescriptions  Medication Sig Dispense Refill  . amiodarone (PACERONE) 200 MG tablet Take 1 tablet (200 mg total) by mouth daily. 90 tablet 3  . carvedilol (COREG) 6.25 MG tablet Take 6.25 mg by mouth 2 (two) times daily with a meal.     . fluticasone (FLONASE) 50 MCG/ACT nasal spray Place 2 sprays into both nostrils daily. 16 g 5  . furosemide (LASIX) 20 MG tablet Take 2 tablets (40 mg total) by mouth daily. 90 tablet 3  . lisinopril (PRINIVIL,ZESTRIL) 2.5 MG tablet TAKE 1 TABLET EVERY DAY 90 tablet 3  . potassium chloride SA (K-DUR,KLOR-CON) 20 MEQ tablet Take 1 tablet (20 mEq total) by mouth daily.    .  pravastatin (PRAVACHOL) 80 MG tablet Take 0.5 tablets (40 mg total) by mouth daily. 45 tablet 0  . tamsulosin (FLOMAX) 0.4 MG CAPS Take 0.4 mg by mouth daily.     Marland Kitchen warfarin (COUMADIN) 5 MG tablet Take 0.5-1 tablets (2.5-5 mg total) by mouth daily. As directed by anticoagulation clinic 90 tablet 1  . spironolactone (ALDACTONE) 25 MG tablet Take 0.5 tablets (12.5 mg total) by mouth every evening. 15 tablet 5   No current facility-administered medications for this encounter.    BP 102/58 mmHg  Pulse 81  Wt 154 lb 8 oz (70.081 kg)  SpO2 96% General: NAD Neck: JVP 7 cm, no thyromegaly or thyroid nodule.  Lungs: CTAB  CV: Nondisplaced PMI.  Heart regular S1/S2, no S3/S4, no murmur.  Trace ankle edema.  No carotid bruit.  Normal pedal pulses.  Abdomen: Soft, nontender, no hepatosplenomegaly, no distention.  Neurologic: Alert and oriented x 3.  Psych: Normal affect. Extremities: No clubbing or  cyanosis.   Assessment/Plan: 1. Chronic systolic CHF: NYHA class II symptoms with no significant volume overload on exam today.  He has CRT-P.  TEE in 5/16 showed EF 35-40% with wall motion abnormalities. Presumed nonischemic cardiomyopathy (prior cath with only mild CAD).  He had an adenosine Cardiolite in 5/15 with EF 36% and possible small area of apical and lateral scar with peri-infarct ischemia.  He is feeling better back in NSR and on higher Lasix dose.   - Continue Lasix 40 mg daily  - Continue current Coreg and lisinopril.  He has been unable to tolerate a higher lisinopril dose due to fatigue. - I will have him try spironolactone again, this time take 12.5 mg in the evening rather than the morning to try to limit symptoms. Check BMET in 2 wks.  2. Atrial fibrillation: Paroxysmal.  Remains in NSR.  Continue amiodarone and warfarin.  - LFTs and TSH were normal recently, will get yearly eye exam.  3. Hyperlipidemia: History of nonobstructive CAD.  Good lipids on pravastatin.    Followup in 4  months.   [name] 09/20/2014

## 2014-09-20 NOTE — Patient Instructions (Signed)
LABS in 2 weeks (bmet bnp)....Brassfield Rarden  START Spironolactone 12.5mg  (1/2 tablet) every evening   FOLLOW UP in 4 months.

## 2014-09-27 ENCOUNTER — Encounter: Payer: Self-pay | Admitting: Internal Medicine

## 2014-10-01 ENCOUNTER — Ambulatory Visit (INDEPENDENT_AMBULATORY_CARE_PROVIDER_SITE_OTHER): Payer: Medicare Other | Admitting: Pharmacist

## 2014-10-01 DIAGNOSIS — I480 Paroxysmal atrial fibrillation: Secondary | ICD-10-CM | POA: Diagnosis not present

## 2014-10-01 DIAGNOSIS — I4891 Unspecified atrial fibrillation: Secondary | ICD-10-CM

## 2014-10-01 DIAGNOSIS — Z5181 Encounter for therapeutic drug level monitoring: Secondary | ICD-10-CM

## 2014-10-01 LAB — POCT INR: INR: 2.7

## 2014-10-03 DIAGNOSIS — H35363 Drusen (degenerative) of macula, bilateral: Secondary | ICD-10-CM | POA: Diagnosis not present

## 2014-10-03 DIAGNOSIS — H539 Unspecified visual disturbance: Secondary | ICD-10-CM | POA: Diagnosis not present

## 2014-10-22 ENCOUNTER — Ambulatory Visit (INDEPENDENT_AMBULATORY_CARE_PROVIDER_SITE_OTHER): Payer: Medicare Other | Admitting: *Deleted

## 2014-10-22 DIAGNOSIS — Z5181 Encounter for therapeutic drug level monitoring: Secondary | ICD-10-CM | POA: Diagnosis not present

## 2014-10-22 DIAGNOSIS — I480 Paroxysmal atrial fibrillation: Secondary | ICD-10-CM

## 2014-10-22 DIAGNOSIS — I4891 Unspecified atrial fibrillation: Secondary | ICD-10-CM

## 2014-10-22 LAB — POCT INR: INR: 2.3

## 2014-10-23 DIAGNOSIS — H0015 Chalazion left lower eyelid: Secondary | ICD-10-CM | POA: Diagnosis not present

## 2014-11-06 ENCOUNTER — Telehealth: Payer: Self-pay | Admitting: Cardiology

## 2014-11-06 ENCOUNTER — Ambulatory Visit (INDEPENDENT_AMBULATORY_CARE_PROVIDER_SITE_OTHER): Payer: Medicare Other | Admitting: *Deleted

## 2014-11-06 DIAGNOSIS — I495 Sick sinus syndrome: Secondary | ICD-10-CM | POA: Diagnosis not present

## 2014-11-06 NOTE — Telephone Encounter (Signed)
Spoke with pt and reminded pt of remote transmission that is due today. Pt verbalized understanding.   

## 2014-11-07 NOTE — Progress Notes (Signed)
Remote pacemaker transmission.   

## 2014-11-15 LAB — CUP PACEART REMOTE DEVICE CHECK
Battery Remaining Longevity: 88 mo
Battery Remaining Percentage: 95.5 %
Battery Voltage: 2.98 V
Brady Statistic AP VP Percent: 94 %
Brady Statistic AP VS Percent: 5.1 %
Brady Statistic AS VP Percent: 1 %
Brady Statistic AS VS Percent: 1 %
Brady Statistic RA Percent Paced: 98 %
Date Time Interrogation Session: 20160830193158
Lead Channel Impedance Value: 430 Ohm
Lead Channel Impedance Value: 450 Ohm
Lead Channel Impedance Value: 830 Ohm
Lead Channel Pacing Threshold Amplitude: 0.75 V
Lead Channel Pacing Threshold Amplitude: 0.75 V
Lead Channel Pacing Threshold Amplitude: 1 V
Lead Channel Pacing Threshold Pulse Width: 0.4 ms
Lead Channel Pacing Threshold Pulse Width: 0.5 ms
Lead Channel Pacing Threshold Pulse Width: 0.5 ms
Lead Channel Sensing Intrinsic Amplitude: 0.6 mV
Lead Channel Sensing Intrinsic Amplitude: 10.6 mV
Lead Channel Setting Pacing Amplitude: 2 V
Lead Channel Setting Pacing Amplitude: 2 V
Lead Channel Setting Pacing Amplitude: 2.5 V
Lead Channel Setting Pacing Pulse Width: 0.4 ms
Lead Channel Setting Pacing Pulse Width: 0.5 ms
Lead Channel Setting Sensing Sensitivity: 2.5 mV
Pulse Gen Model: 3210
Pulse Gen Serial Number: 2926516

## 2014-11-21 ENCOUNTER — Encounter: Payer: Self-pay | Admitting: Cardiology

## 2014-11-27 ENCOUNTER — Ambulatory Visit: Payer: Medicare Other | Admitting: Family Medicine

## 2014-11-28 ENCOUNTER — Encounter: Payer: Self-pay | Admitting: Internal Medicine

## 2014-12-04 ENCOUNTER — Ambulatory Visit (INDEPENDENT_AMBULATORY_CARE_PROVIDER_SITE_OTHER): Payer: Medicare Other | Admitting: Family Medicine

## 2014-12-04 VITALS — BP 100/60 | HR 83 | Temp 98.4°F | Wt 159.0 lb

## 2014-12-04 DIAGNOSIS — Z23 Encounter for immunization: Secondary | ICD-10-CM

## 2014-12-04 DIAGNOSIS — R053 Chronic cough: Secondary | ICD-10-CM

## 2014-12-04 DIAGNOSIS — I5022 Chronic systolic (congestive) heart failure: Secondary | ICD-10-CM

## 2014-12-04 DIAGNOSIS — R05 Cough: Secondary | ICD-10-CM

## 2014-12-04 DIAGNOSIS — M542 Cervicalgia: Secondary | ICD-10-CM

## 2014-12-04 NOTE — Progress Notes (Signed)
Garret Reddish, MD  Subjective:  [name] is a 81 y.o. year old very pleasant male patient who presents for/with See problem oriented charting ROS- no chest pain or shortness of breath. No worsening edema. No hemoptysis. No clear orthopnea or PND but possible given issues when he first lies down. No hand or leg weakness. No fecal or urinary incontinence  Past Medical History-  Patient Active Problem List   Diagnosis Date Noted  . Chronic systolic CHF (congestive heart failure) 03/21/2013    Priority: High  . Nonischemic cardiomyopathy 09/20/2010    Priority: High  . PPM-St.Jude 11/27/2008    Priority: High  . CAD (coronary artery disease) 07/20/2008    Priority: High  . SICK SINUS SYNDROME 07/20/2008    Priority: High  . Atrial fibrillation 03/16/2007    Priority: High  . Chronic cough 08/18/2013    Priority: Medium  . Dyspnea 08/16/2013    Priority: Medium  . Pleural effusion, right 08/16/2013    Priority: Medium  . Pleural effusion on right 07/06/2012    Priority: Medium  . Hyperlipidemia 03/16/2007    Priority: Medium  . BPH (benign prostatic hyperplasia) 03/16/2007    Priority: Medium  . Allergic rhinitis 02/14/2014    Priority: Low  . Fecal incontinence 10/18/2013    Priority: Low  . Recurrent right inguinal hernia 11/22/2012    Priority: Low  . Intention tremor 11/01/2012    Priority: Low  . Polymyalgia rheumatica 07/03/2008    Priority: Low  . MIXED HEARING LOSS BILATERAL 01/10/2008    Priority: Low  . GERD 03/16/2007    Priority: Low  . Trigger thumb of right hand 07/04/2014  . Encounter for therapeutic drug monitoring 07/02/2014  . Fecal soiling due to fecal incontinence 04/20/2014    Medications- reviewed and updated Current Outpatient Prescriptions  Medication Sig Dispense Refill  . amiodarone (PACERONE) 200 MG tablet Take 1 tablet (200 mg total) by mouth daily. 90 tablet 3  . carvedilol (COREG) 6.25 MG tablet Take 6.25 mg by mouth 2 (two)  times daily with a meal.     . fluticasone (FLONASE) 50 MCG/ACT nasal spray Place 2 sprays into both nostrils daily. 16 g 5  . furosemide (LASIX) 20 MG tablet Take 2 tablets (40 mg total) by mouth daily. 90 tablet 3  . lisinopril (PRINIVIL,ZESTRIL) 2.5 MG tablet TAKE 1 TABLET EVERY DAY 90 tablet 3  . potassium chloride SA (K-DUR,KLOR-CON) 20 MEQ tablet Take 1 tablet (20 mEq total) by mouth daily.    . pravastatin (PRAVACHOL) 80 MG tablet Take 0.5 tablets (40 mg total) by mouth daily. 45 tablet 0  . spironolactone (ALDACTONE) 25 MG tablet Take 0.5 tablets (12.5 mg total) by mouth every evening. 15 tablet 5  . tamsulosin (FLOMAX) 0.4 MG CAPS Take 0.4 mg by mouth daily.     Marland Kitchen warfarin (COUMADIN) 5 MG tablet Take 0.5-1 tablets (2.5-5 mg total) by mouth daily. As directed by anticoagulation clinic 90 tablet 1   No current facility-administered medications for this visit.    Objective: BP 100/60 mmHg  Pulse 83  Temp(Src) 98.4 F (36.9 C)  Wt 159 lb (72.122 kg)  SpO2 96% Gen: NAD, resting comfortably CV: RRR no murmurs rubs or gallops Lungs: CTAB no crackles, wheeze, rhonchi Abdomen: soft/nontender/nondistended/normal bowel sounds. No rebound or guarding.  Ext: trace edema Skin: warm, dry MSK: mild muscle strain/tightening in right side of neck Neuro: grossly normal, moves all extremities  Assessment/Plan:  Chronic cough S:Persistent dry  cough present since at least 08/25/13 when he saw Dr. Chase Caller. Concern was for postnasal drip. Patient used Flonase.flonase has made no difference even when he takes it for a month. Primarily notes tickle in throat when he goes to lie down- uncontrollable cough for 4-5 minutes then quits but does cough other times a day. He decided to self titrate himself off lisinopril. Off lisinopril for last week- no difference A/P: My understanding it may take longer than a week for cough to resolve when ACE INHIBITOR but will reach out to Dr. Chase Caller for his  opinion. My current thought is that patient be off for a month then return to Dr. Chase Caller if does not resolve.   Chronic systolic CHF (congestive heart failure) S: Patient compliant with Lasix 20mg , spironolactone 12.5mg , and potassium,  Carvedilol. He is off lisinopril 2.5mg  A/P: BP: 100/60 mmHg with patient off of lisinopril considered restarting angiotensin receptor blocker. I'm not sure if patient can tolerate this from a blood pressure perspective. He is already scheduled with cardiology follow-up and he will discuss at that time. Certainly this would be advisable given his systolic congestive heart failure   Right-sided neck pain S:2 weeks ago with beach trip, thought he had a "crick in his neck". Sometimes would hurt into right side of face. Lasted 2-3 days then gone. About a week later had recurrence of pain on the right. Mild most of the time to moderate at worst A/P: Suspect musculoskeletal strain likely from sleeping on a different pillow. Hesitant to use muscle relaxants at patient's age. We opted instead for conservative therapy with heating or icing as well as Tylenol. Discussed cervical films but thought likely low yield. Consider future visit  Return precautions advised.   Orders Placed This Encounter  Procedures  . Flu Vaccine QUAD 36+ mos IM

## 2014-12-04 NOTE — Assessment & Plan Note (Signed)
S: Patient compliant with Lasix 20mg , spironolactone 12.5mg , and potassium,  Carvedilol. He is off lisinopril 2.5mg  A/P: BP: 100/60 mmHg with patient off of lisinopril considered restarting angiotensin receptor blocker. I'm not sure if patient can tolerate this from a blood pressure perspective. He is already scheduled with cardiology follow-up and he will discuss at that time. Certainly this would be advisable given his systolic congestive heart failure

## 2014-12-04 NOTE — Patient Instructions (Addendum)
Flu shot received today.  Stay off the lisinopril until you see Dr. Aundra Dubin. We will get Dr. Golden Pop opinion on duration to remain off to see if we need to give you more time. I am suspicious we will need to wait at least a month. Dr. Aundra Dubin may add a different medicine in within the class ARB- I can start this sooner if he wants but I am hesitant today given your blood pressure. Dr. Chase Caller likely will want to see you back.   Neck tension- wonder if produced during trip with different pillow or neck position at night. Feels like a muscle spasm. Tylenol is ok to use. We can do neck films in future if you would like. There are some stronger muscle relaxants that I am hesitant to use at your age though. I would prefer if you would use a warm compress or ice when this flares up. If not better within a month, let's follow up- could also consider massage therapy between now and then.

## 2014-12-04 NOTE — Assessment & Plan Note (Addendum)
S:Persistent dry cough present since at least 08/25/13 when he saw Dr. Chase Caller. Concern was for postnasal drip. Patient used Flonase.flonase has made no difference even when he takes it for a month. Primarily notes tickle in throat when he goes to lie down- uncontrollable cough for 4-5 minutes then quits but does cough other times a day. He decided to self titrate himself off lisinopril. Off lisinopril for last week- no difference A/P: My understanding it may take longer than a week for cough to resolve when ACE INHIBITOR but will reach out to Dr. Chase Caller for his opinion. My current thought is that patient be off for a month then return to Dr. Chase Caller if does not resolve.

## 2014-12-06 ENCOUNTER — Encounter: Payer: Self-pay | Admitting: Cardiology

## 2014-12-06 ENCOUNTER — Ambulatory Visit (INDEPENDENT_AMBULATORY_CARE_PROVIDER_SITE_OTHER): Payer: Medicare Other | Admitting: Cardiology

## 2014-12-06 VITALS — BP 128/64 | HR 73 | Ht 66.0 in | Wt 156.0 lb

## 2014-12-06 DIAGNOSIS — E785 Hyperlipidemia, unspecified: Secondary | ICD-10-CM | POA: Diagnosis not present

## 2014-12-06 DIAGNOSIS — I480 Paroxysmal atrial fibrillation: Secondary | ICD-10-CM

## 2014-12-06 DIAGNOSIS — I5022 Chronic systolic (congestive) heart failure: Secondary | ICD-10-CM

## 2014-12-06 DIAGNOSIS — I2510 Atherosclerotic heart disease of native coronary artery without angina pectoris: Secondary | ICD-10-CM | POA: Diagnosis not present

## 2014-12-06 MED ORDER — LOSARTAN POTASSIUM 25 MG PO TABS: ORAL_TABLET | ORAL | Status: DC

## 2014-12-06 NOTE — Patient Instructions (Addendum)
Medication Instructions:  Stop lisinopril. Start losartan 12.5mg  daily. This will be 1/2 of a 25mg  tablet daily.  If you still have a cough after you have been off lisinopril for a month try prilosec 20mg  ( you can get this without a prescription). It you still have a cough after you have been taking prilosec for 2 weeks, call our office.  Labwork: CMET/Lipid profile/TSH today  Testing/Procedures: None today  Follow-Up: Your physician recommends that you schedule a follow-up appointment in: 3 months with Dr Aundra Dubin in the [facility] at [facility].

## 2014-12-07 ENCOUNTER — Encounter: Payer: Self-pay | Admitting: Cardiology

## 2014-12-07 LAB — COMPREHENSIVE METABOLIC PANEL
ALT: 26 U/L (ref 0–53)
AST: 25 U/L (ref 0–37)
Albumin: 4.4 g/dL (ref 3.5–5.2)
Alkaline Phosphatase: 52 U/L (ref 39–117)
BUN: 29 mg/dL — AB (ref 6–23)
CHLORIDE: 102 meq/L (ref 96–112)
CO2: 29 meq/L (ref 19–32)
CREATININE: 1.11 mg/dL (ref 0.40–1.50)
Calcium: 9.9 mg/dL (ref 8.4–10.5)
GFR: 67.15 mL/min (ref >60.00)
GLUCOSE: 85 mg/dL (ref 70–99)
POTASSIUM: 4.5 meq/L (ref 3.5–5.1)
SODIUM: 139 meq/L (ref 135–145)
Total Bilirubin: 0.7 mg/dL (ref 0.2–1.2)
Total Protein: 7.5 g/dL (ref 6.0–8.3)

## 2014-12-07 LAB — LIPID PANEL
CHOL/HDL RATIO: 4
Cholesterol: 135 mg/dL (ref 0–200)
HDL: 36 mg/dL — AB (ref >39.00)
NONHDL: 99.11
Triglycerides: 227 mg/dL — ABNORMAL HIGH (ref 0.0–149.0)
VLDL: 45.4 mg/dL — AB (ref 0.0–40.0)

## 2014-12-07 LAB — LDL CHOLESTEROL, DIRECT: LDL DIRECT: 92 mg/dL

## 2014-12-07 LAB — TSH: TSH: 4.63 u[IU]/mL — ABNORMAL HIGH (ref 0.35–4.50)

## 2014-12-08 NOTE — Progress Notes (Signed)
Patient ID: [name], male   DOB: [birth year], 81 y.o.   MRN: [id number] PCP: Dr. Yong Channel  81 yo with history of paroxysmal atrial fibrillation, nonischemic cardiomyopathy, and complete heart block with St Jude CRT-P system presents for cardiology followup.  He was admitted in 4/14 with pacemaker pocket infection.  His pacemaker was removed and temporary-permanent device was placed.  Later, he had the temporary-permanent device removed and a new CRT-P device was placed. He developed dyspnea post-operatively and was found to have a large right-sided pleural effusion.  He was admitted and got a chest tube for drainage. He had followup with Dr. Servando Snare and it was decided that he would not need VATS.  He had been on amiodarone for maintenance of NSR.  This had been more successful than Tikosyn.   Around 3/15, he started developing increasing exertional dyspnea.  He was short of breath walking up a hill or incline.  He could still walk on the treadmill for exercise for 10-15 minutes and use the elliptical without much trouble on most days.  No orthopnea or PND.  No chest pain.  He saw Dr Leanne Chang and was started on Lasix three times a week due to concern for volume overload.  This did not seem to have helped much.  After that, he had PFTs done which showed normal spirometry but DLCO 50% predicted.  Therefore, amiodarone was stopped due to concern for lung toxicity.  He saw Richardson Dopp for a pre-operative evaluation prior to right inguinal hernia repair.  Given the exertional dyspnea, he was set up for adenosine Cardiolite in 5/15.  This showed EF 36% with a small area of primarily scar in the apex, apical lateral wall, and mid anterolateral wall.  He has not felt palpitations. Echo in 5/15 showed EF 40-45%, similar to prior study.   I had him see pulmonary for evaluation for amiodarone lung toxicity.  CT chest looked ok, and he was not thought to have amiodarone lung toxicity.   At a prior appointment this  year, [name] was noted to be back in atrial fibrillation.  He was more short of breath with exertion and fatigued.  He has historically tolerated atrial fibrillation poorly.  I did a TEE-guided DCCV in 5/16 back to NSR.  TEE showed EF 35-40%.  He did not immediately feel better like he has in the past post-DCCV, so Lasix was increased.  He is currently taking 40 mg daily.  He now feels back to normal.  No dyspnea walking on flat ground or going up steps.  No chest pain.  No palpitations or lightheadedness.  No orthopnea or PND.  He remains out of atrial fibrillation today.  He has had a chronic dry cough so recently stopped lisinopril.  ECG: a-biv sequential pacing  Labs (7/13); LDL 65, HDL 38 Labs (3/14): TSH normal Labs (4/14):  ALT 61, AST 62 Labs (5/14): K 4.5, creatinine 0.9, BNP 727, digoxin 0.9 Labs (6/14): LDL 77, HDL 34, K 4, creatinine 0.9, TSH normal Labs (10/14): digoxin 2.1, K 4.7, creatinine 1.0, TSH normal, LFTs normal Labs (3/15): K 4, creatinine 1.1 Labs (4/15): BNP 482 Labs (5/15): K 3.8, creatinine 1.2 Labs (6/15): K 4.3, creatinine 0.9 Labs (7/15): TSH normal, LFTs normal Labs (9/15): TSH normal, LFTs normal Labs (1/16): K 4.3, creatinine 1.07 Labs (5/16): TSH normal, LFTs normal, K 4.4, creatinine 1.02 Labs (6/16): K 4.4, creatinine 1.29  PMH: 1. Low back pain 2. Hyperlipidemia 3. GERD 4. BPH 5. Polymyalgia  rheumatica 6. Atrial fibrillation: Failed Tikosyn in the past.  Paroxysmal, on amiodarone and warfarin.  DCCV in 4/13, 7/13, 10/13.  Has held NSR with amiodarone.  However, concern for amiodarone lung toxicity: PFTs (4/15) with FVC 90% predicted, FEV1 90%, ratio 97%, DLCO 50%.   Recurrence of atrial fibrillation in 5/16, TEE-guided DCCV back to NSR in 5/16.  7. Transaminitis: Mild, attributed to amiodarone.  8. Complete heart block s/p St Jude CRT-P device.  Patient developed PCM pocket infection in 4/14 and had his first system removed with placement of a  temporary permanent PCM.  He later had CRT-P device replaced.  This was complicated by large right-sided hemorrhagic pleural effusion requiring chest tube.   9. Nonischemic cardiomyopathy: Echo (9/13) with EF 40-45%, diffuse HK, mild Craig.  Mild CAD only on prior LHC.  Echo (4/14) with EF 45-50%.  Adenosine Cardiolite (5/15) with EF 36% (visually appeared higher per report), small area of scar with peri-infarct ischemia in the apex, apical lateral wall, and mid anterolateral wall. Echo (5/15) with EF 40-45%, basal to mid inferolateral severe hypokinesis, basal to mid anterolateral hypokinesis, mildly dilated RV with mildly decreased RV systolic function, mild to moderate Craig.  TEE (5/16) with EF 35-40%, inferior and inferolateral severe hypokinesis, normal RV size with mildly decreased systolic function, moderate Craig, peak RV-RA gradient 26 mmHg.  10. Large right pleural effusion requiring chest tube following removal and reimplantation of PCM. Most recent chest CT in 6/15 showed significant decrease in size of loculated pleural effusion since 5/14.    SH: Married, prior smoker (quit 1985), no ETOH x years, retired, lives at [employer]  [place]: Father with MI at 40, mother with AAA.   ROS: All systems reviewed and negative except as per HPI.   Current Outpatient Prescriptions  Medication Sig Dispense Refill  . amiodarone (PACERONE) 200 MG tablet Take 1 tablet (200 mg total) by mouth daily. 90 tablet 3  . carvedilol (COREG) 6.25 MG tablet Take 6.25 mg by mouth 2 (two) times daily with a meal.     . furosemide (LASIX) 20 MG tablet Take 2 tablets (40 mg total) by mouth daily. 90 tablet 3  . potassium chloride SA (K-DUR,KLOR-CON) 20 MEQ tablet Take 1 tablet (20 mEq total) by mouth daily.    . pravastatin (PRAVACHOL) 80 MG tablet Take 0.5 tablets (40 mg total) by mouth daily. 45 tablet 0  . spironolactone (ALDACTONE) 25 MG tablet Take 0.5 tablets (12.5 mg total) by mouth every evening. 15 tablet 5  . tamsulosin  (FLOMAX) 0.4 MG CAPS Take 0.4 mg by mouth daily.     Marland Kitchen warfarin (COUMADIN) 5 MG tablet Take 0.5-1 tablets (2.5-5 mg total) by mouth daily. As directed by anticoagulation clinic 90 tablet 1  . losartan (COZAAR) 25 MG tablet 1/2 tablet (12.5mg  ) by mouth  daily 45 tablet 3   No current facility-administered medications for this visit.    BP 128/64 mmHg  Pulse 73  Ht 5\' 6"  (1.676 m)  Wt 156 lb (70.761 kg)  BMI 25.19 kg/m2 General: NAD Neck: JVP 7 cm, no thyromegaly or thyroid nodule.  Lungs: CTAB  CV: Nondisplaced PMI.  Heart regular S1/S2, no S3/S4, no murmur.  No edema.  No carotid bruit.  Normal pedal pulses.  Abdomen: Soft, nontender, no hepatosplenomegaly, no distention.  Neurologic: Alert and oriented x 3.  Psych: Normal affect. Extremities: No clubbing or cyanosis.   Assessment/Plan: 1. Chronic systolic CHF: NYHA class II symptoms with no significant volume  overload on exam today.  He has CRT-P.  TEE in 5/16 showed EF 35-40% with wall motion abnormalities. Presumed nonischemic cardiomyopathy (prior cath with only mild CAD).  He had an adenosine Cardiolite in 5/15 with EF 36% and possible small area of apical and lateral scar with peri-infarct ischemia.  He is feeling better back in NSR and on higher Lasix dose.   - Continue Lasix 40 mg daily, BMET today. - Continue current Coreg and spironolactone. - Off lisinopril due to cough.  I will have him start losartan 12.5 mg daily.  2. Atrial fibrillation: Paroxysmal.  Remains in NSR.  Continue amiodarone and warfarin.  - Check LFTs and TSH, will get yearly eye exam.  3. Hyperlipidemia: History of nonobstructive CAD.  Good lipids on pravastatin.    Followup in 3 months in CHF clinic.   [name] 12/08/2014

## 2014-12-10 ENCOUNTER — Telehealth: Payer: Self-pay | Admitting: Family Medicine

## 2014-12-10 ENCOUNTER — Other Ambulatory Visit: Payer: Self-pay | Admitting: *Deleted

## 2014-12-10 ENCOUNTER — Ambulatory Visit: Payer: Medicare Other | Admitting: Family Medicine

## 2014-12-10 DIAGNOSIS — I480 Paroxysmal atrial fibrillation: Secondary | ICD-10-CM

## 2014-12-10 DIAGNOSIS — R7989 Other specified abnormal findings of blood chemistry: Secondary | ICD-10-CM

## 2014-12-10 MED ORDER — CARVEDILOL 12.5 MG PO TABS: 12.5000 mg | ORAL_TABLET | Freq: Every day | ORAL | Status: DC

## 2014-12-10 NOTE — Telephone Encounter (Signed)
Rx changed and sent to [facility].

## 2014-12-10 NOTE — Telephone Encounter (Signed)
Pt needs refill on carvedilol 12.5 mg instead of 6.25 mg #90 sent to [facility] order pharm. Pt states insurance will not cover 6.25 mg.

## 2014-12-11 ENCOUNTER — Other Ambulatory Visit (INDEPENDENT_AMBULATORY_CARE_PROVIDER_SITE_OTHER): Payer: Medicare Other

## 2014-12-11 DIAGNOSIS — R7989 Other specified abnormal findings of blood chemistry: Secondary | ICD-10-CM

## 2014-12-11 DIAGNOSIS — E2839 Other primary ovarian failure: Secondary | ICD-10-CM

## 2014-12-11 DIAGNOSIS — R799 Abnormal finding of blood chemistry, unspecified: Secondary | ICD-10-CM | POA: Diagnosis not present

## 2014-12-11 DIAGNOSIS — I5022 Chronic systolic (congestive) heart failure: Secondary | ICD-10-CM | POA: Diagnosis not present

## 2014-12-11 DIAGNOSIS — I480 Paroxysmal atrial fibrillation: Secondary | ICD-10-CM

## 2014-12-11 LAB — TSH: TSH: 3.89 u[IU]/mL (ref 0.35–4.50)

## 2014-12-11 LAB — BASIC METABOLIC PANEL
BUN: 29 mg/dL — AB (ref 6–23)
CALCIUM: 9.6 mg/dL (ref 8.4–10.5)
CHLORIDE: 104 meq/L (ref 96–112)
CO2: 25 mEq/L (ref 19–32)
CREATININE: 1.07 mg/dL (ref 0.40–1.50)
GFR: 70.05 mL/min (ref >60.00)
Glucose, Bld: 91 mg/dL (ref 70–99)
Potassium: 4.5 mEq/L (ref 3.5–5.1)
Sodium: 141 mEq/L (ref 135–145)

## 2014-12-11 LAB — T3, FREE: T3 FREE: 3 pg/mL (ref 2.3–4.2)

## 2014-12-11 LAB — BRAIN NATRIURETIC PEPTIDE: PRO B NATRI PEPTIDE: 541 pg/mL — AB (ref 0.0–100.0)

## 2014-12-11 LAB — T4, FREE: FREE T4: 0.99 ng/dL (ref 0.60–1.60)

## 2014-12-12 ENCOUNTER — Other Ambulatory Visit: Payer: Medicare Other

## 2014-12-18 ENCOUNTER — Telehealth: Payer: Self-pay | Admitting: Cardiology

## 2014-12-18 DIAGNOSIS — I480 Paroxysmal atrial fibrillation: Secondary | ICD-10-CM

## 2014-12-18 DIAGNOSIS — I5022 Chronic systolic (congestive) heart failure: Secondary | ICD-10-CM

## 2014-12-18 MED ORDER — LOSARTAN POTASSIUM 25 MG PO TABS: ORAL_TABLET | ORAL | Status: DC

## 2014-12-18 NOTE — Telephone Encounter (Signed)
Pt states cough better since stopping lisinopril Pt advised he should start losartan 12.5mg  daily as ordered by Dr Aundra Dubin. Pt advised I will resend prescription to [facility], to let me know if he does not receive prescription.

## 2014-12-18 NOTE — Telephone Encounter (Signed)
New Message  Pt called states that he received his losartan today. He wanted Dr. Claris Gladden nurse to be aware.

## 2014-12-18 NOTE — Telephone Encounter (Signed)
LMTCB

## 2014-12-18 NOTE — Telephone Encounter (Signed)
Spoke with patient.

## 2014-12-18 NOTE — Telephone Encounter (Signed)
New message      Calling to let Dr [name] know he has not started taking his losartan.  Patient states the presc was never called in to [facility] mail order. Do you still want him to start the rx?

## 2014-12-18 NOTE — Telephone Encounter (Signed)
noted 

## 2014-12-30 ENCOUNTER — Encounter: Payer: Self-pay | Admitting: Cardiology

## 2014-12-30 IMAGING — CR DG CHEST 2V
2 series · 2 of 2 positions shown · non-contrast
Comparison: 09/27/2010..

CLINICAL DATA: History of previous smoking.  Atrial fibrillation.

CHEST - 2 VIEW

[view not recorded (1 of 2)]
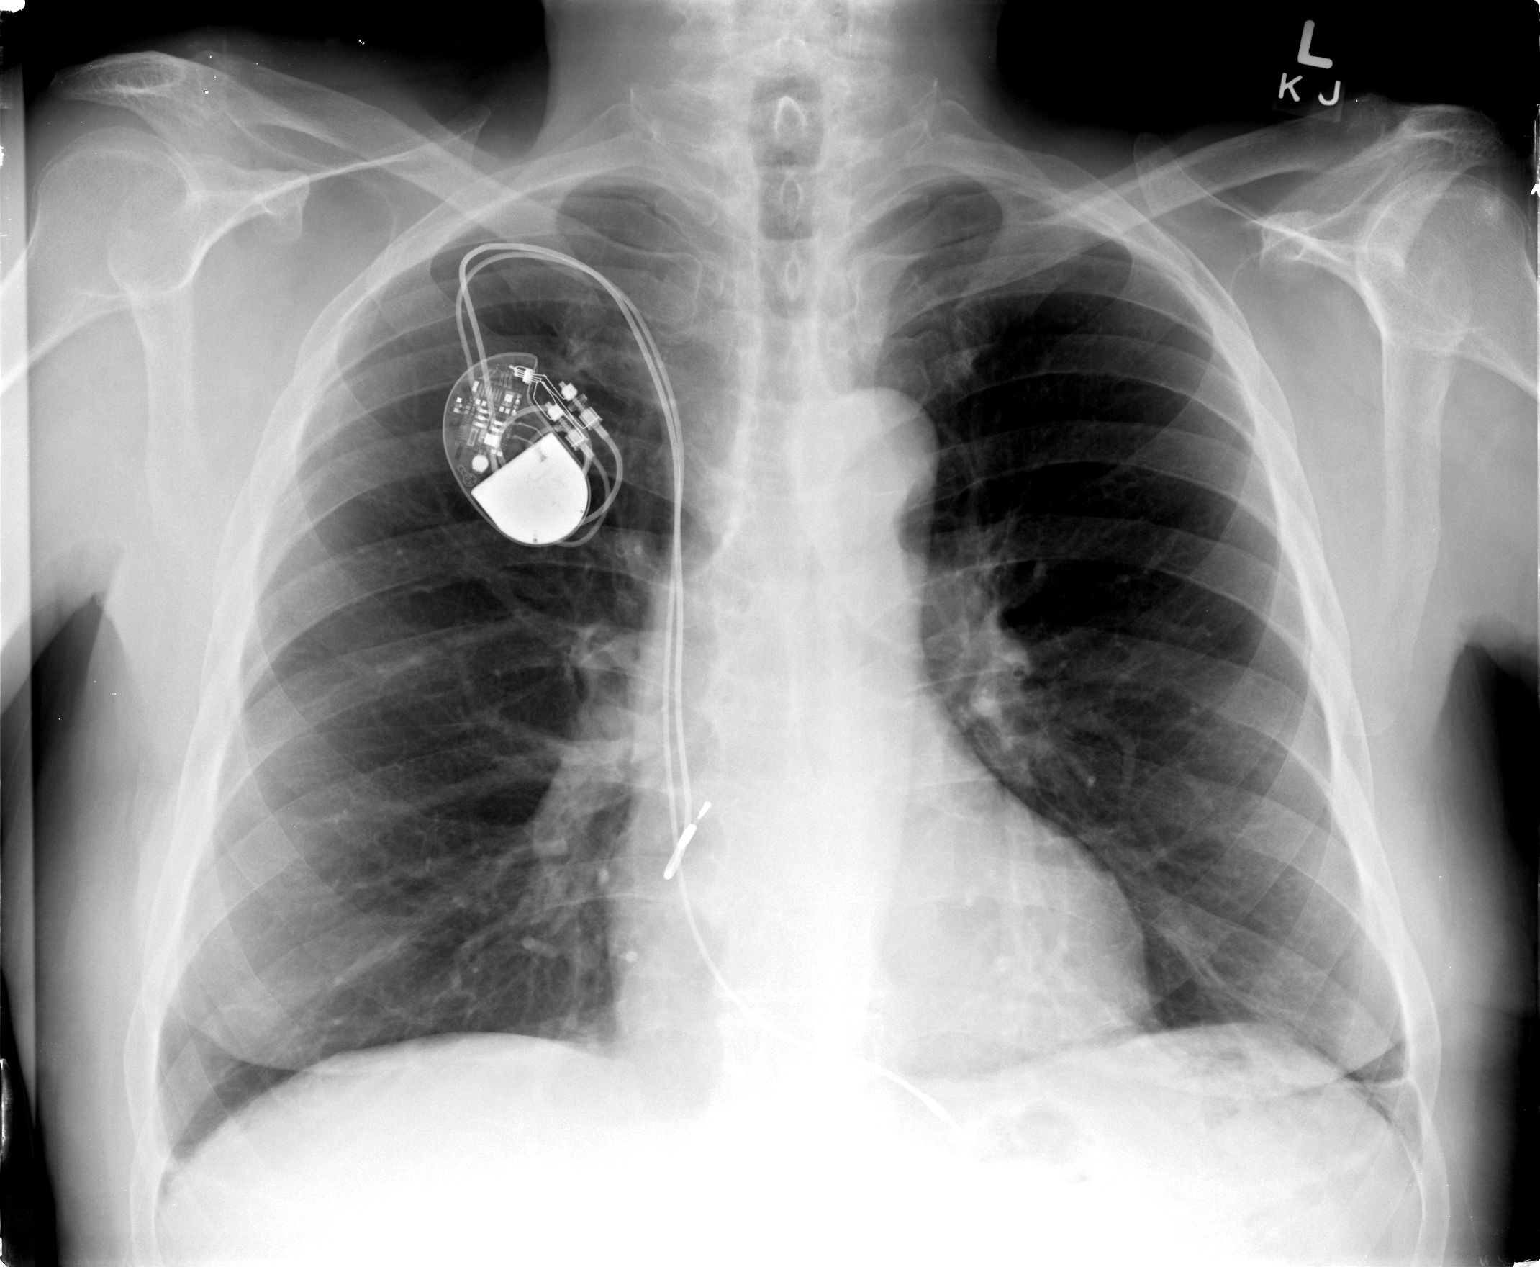

[view not recorded (2 of 2)]
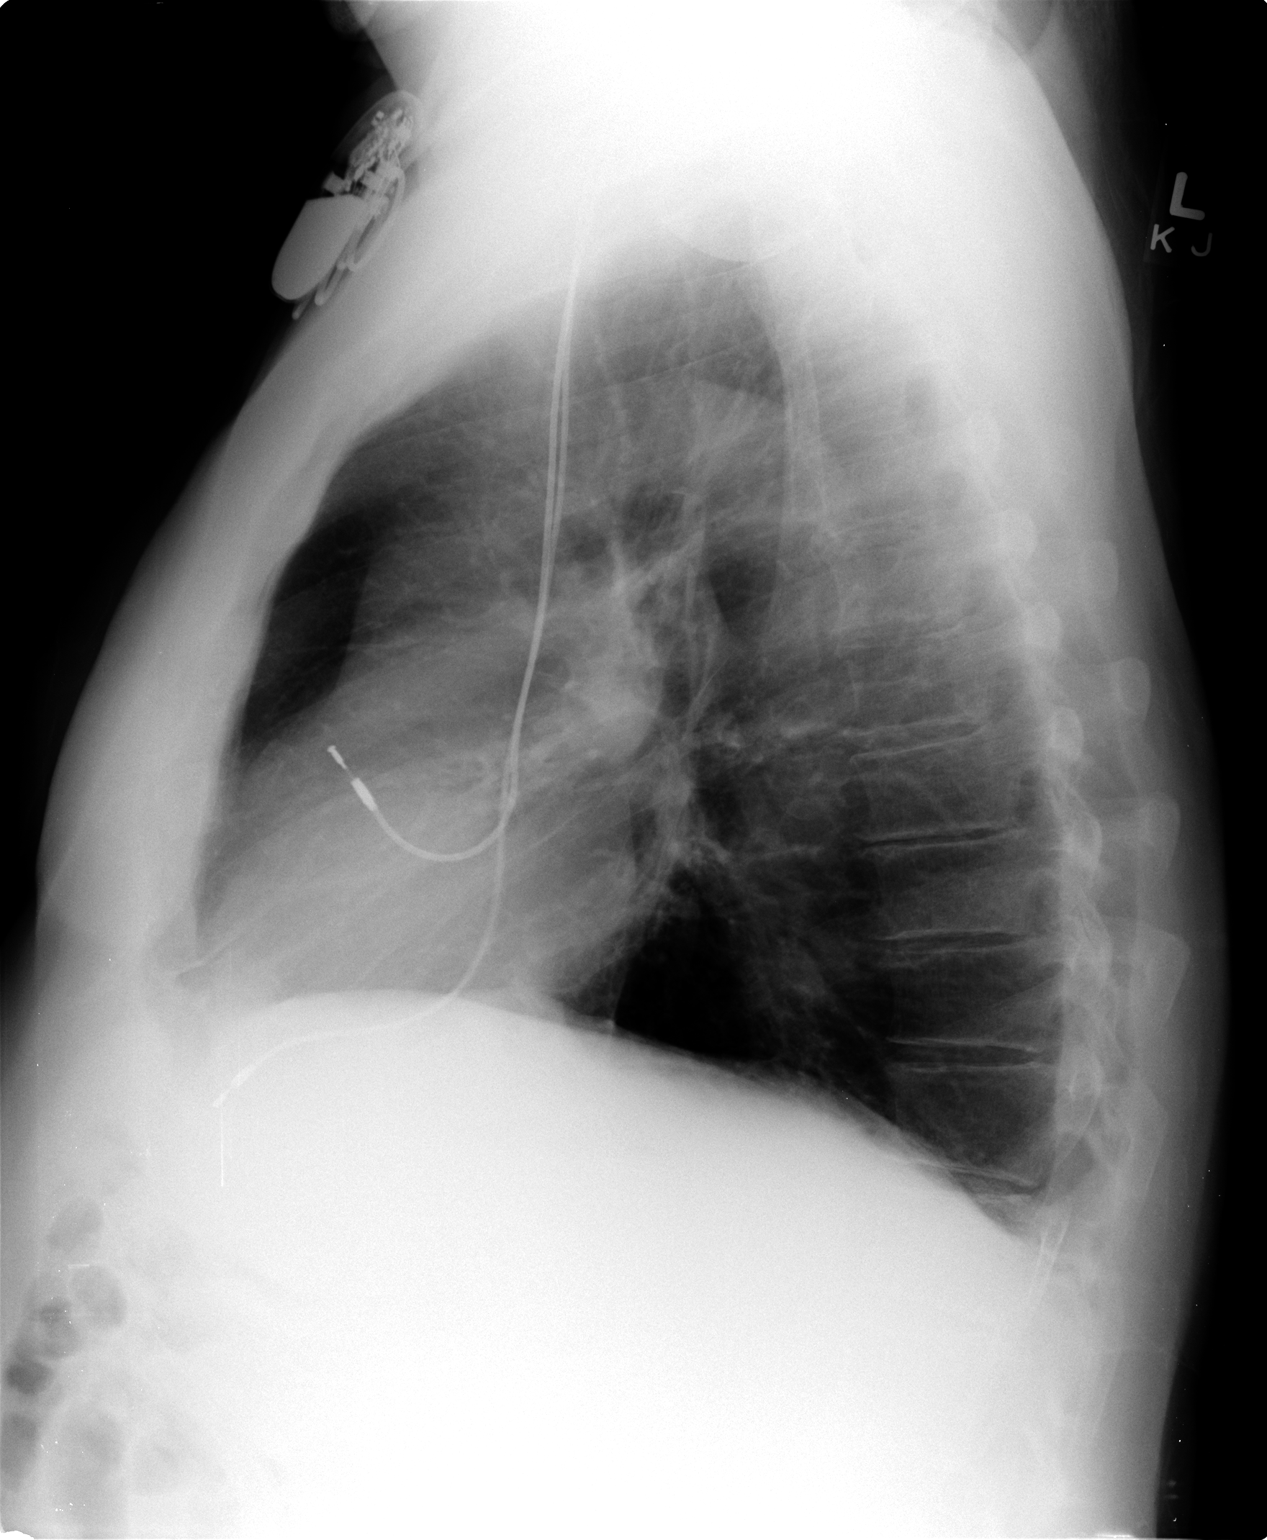

[2 of 2 positions shown; findings below may reference images not displayed]

FINDINGS: Cardiac silhouette is normal size and shape.  Mediastinal
and hilar contours appear stable.  Dual lead transvenous pacemaker
is in place with generator on the right.  No pulmonary infiltrates
or masses are evident.  There is slight overall hyperinflation
configuration. No pleural abnormality is evident. Bones appear
average for age.
IMPRESSION: Slight hyperinflation configuration.  Transvenous pacemaker in
place.  No pulmonary edema, pneumonia, or pleural effusion.

## 2014-12-31 ENCOUNTER — Other Ambulatory Visit: Payer: Self-pay

## 2014-12-31 MED ORDER — PRAVASTATIN SODIUM 80 MG PO TABS: 2.0000 mg | ORAL_TABLET | Freq: Every day | ORAL | Status: DC

## 2015-01-09 DIAGNOSIS — R350 Frequency of micturition: Secondary | ICD-10-CM | POA: Diagnosis not present

## 2015-01-09 DIAGNOSIS — N401 Enlarged prostate with lower urinary tract symptoms: Secondary | ICD-10-CM | POA: Diagnosis not present

## 2015-01-09 DIAGNOSIS — N4289 Other specified disorders of prostate: Secondary | ICD-10-CM | POA: Diagnosis not present

## 2015-01-09 DIAGNOSIS — R351 Nocturia: Secondary | ICD-10-CM | POA: Diagnosis not present

## 2015-01-16 ENCOUNTER — Other Ambulatory Visit: Payer: Self-pay | Admitting: Cardiology

## 2015-01-16 ENCOUNTER — Telehealth: Payer: Self-pay | Admitting: Cardiology

## 2015-01-16 DIAGNOSIS — I480 Paroxysmal atrial fibrillation: Secondary | ICD-10-CM

## 2015-01-16 MED ORDER — FUROSEMIDE 20 MG PO TABS: 40.0000 mg | ORAL_TABLET | Freq: Every day | ORAL | Status: DC

## 2015-01-16 NOTE — Telephone Encounter (Signed)
Pt's medication was sent to pt's pharmacy. Confirmation received °

## 2015-01-16 NOTE — Telephone Encounter (Signed)
New message      *STAT* If patient is at the pharmacy, call can be transferred to refill team.   1. Which medications need to be refilled? (please list name of each medication and dose if known) furosemide 20 mg twice a day   2. Which pharmacy/location (including street and city if local pharmacy) is medication to be sent to? [facility] mail order [phone]  3. Do they need a 30 day or 90 day supply? 90 days with 3 refills

## 2015-01-22 ENCOUNTER — Other Ambulatory Visit: Payer: Self-pay | Admitting: Cardiology

## 2015-01-23 ENCOUNTER — Ambulatory Visit (INDEPENDENT_AMBULATORY_CARE_PROVIDER_SITE_OTHER): Payer: Medicare Other | Admitting: *Deleted

## 2015-01-23 DIAGNOSIS — I480 Paroxysmal atrial fibrillation: Secondary | ICD-10-CM

## 2015-01-23 DIAGNOSIS — Z5181 Encounter for therapeutic drug level monitoring: Secondary | ICD-10-CM | POA: Diagnosis not present

## 2015-01-23 DIAGNOSIS — I4891 Unspecified atrial fibrillation: Secondary | ICD-10-CM

## 2015-01-23 LAB — POCT INR: INR: 4

## 2015-02-01 IMAGING — CR DG CHEST 2V
2 series · 2 of 2 positions shown · non-contrast
Comparison: 06/28/2012

CLINICAL DATA: Post pacemaker insertion

CHEST - 2 VIEW

[w chest pa]
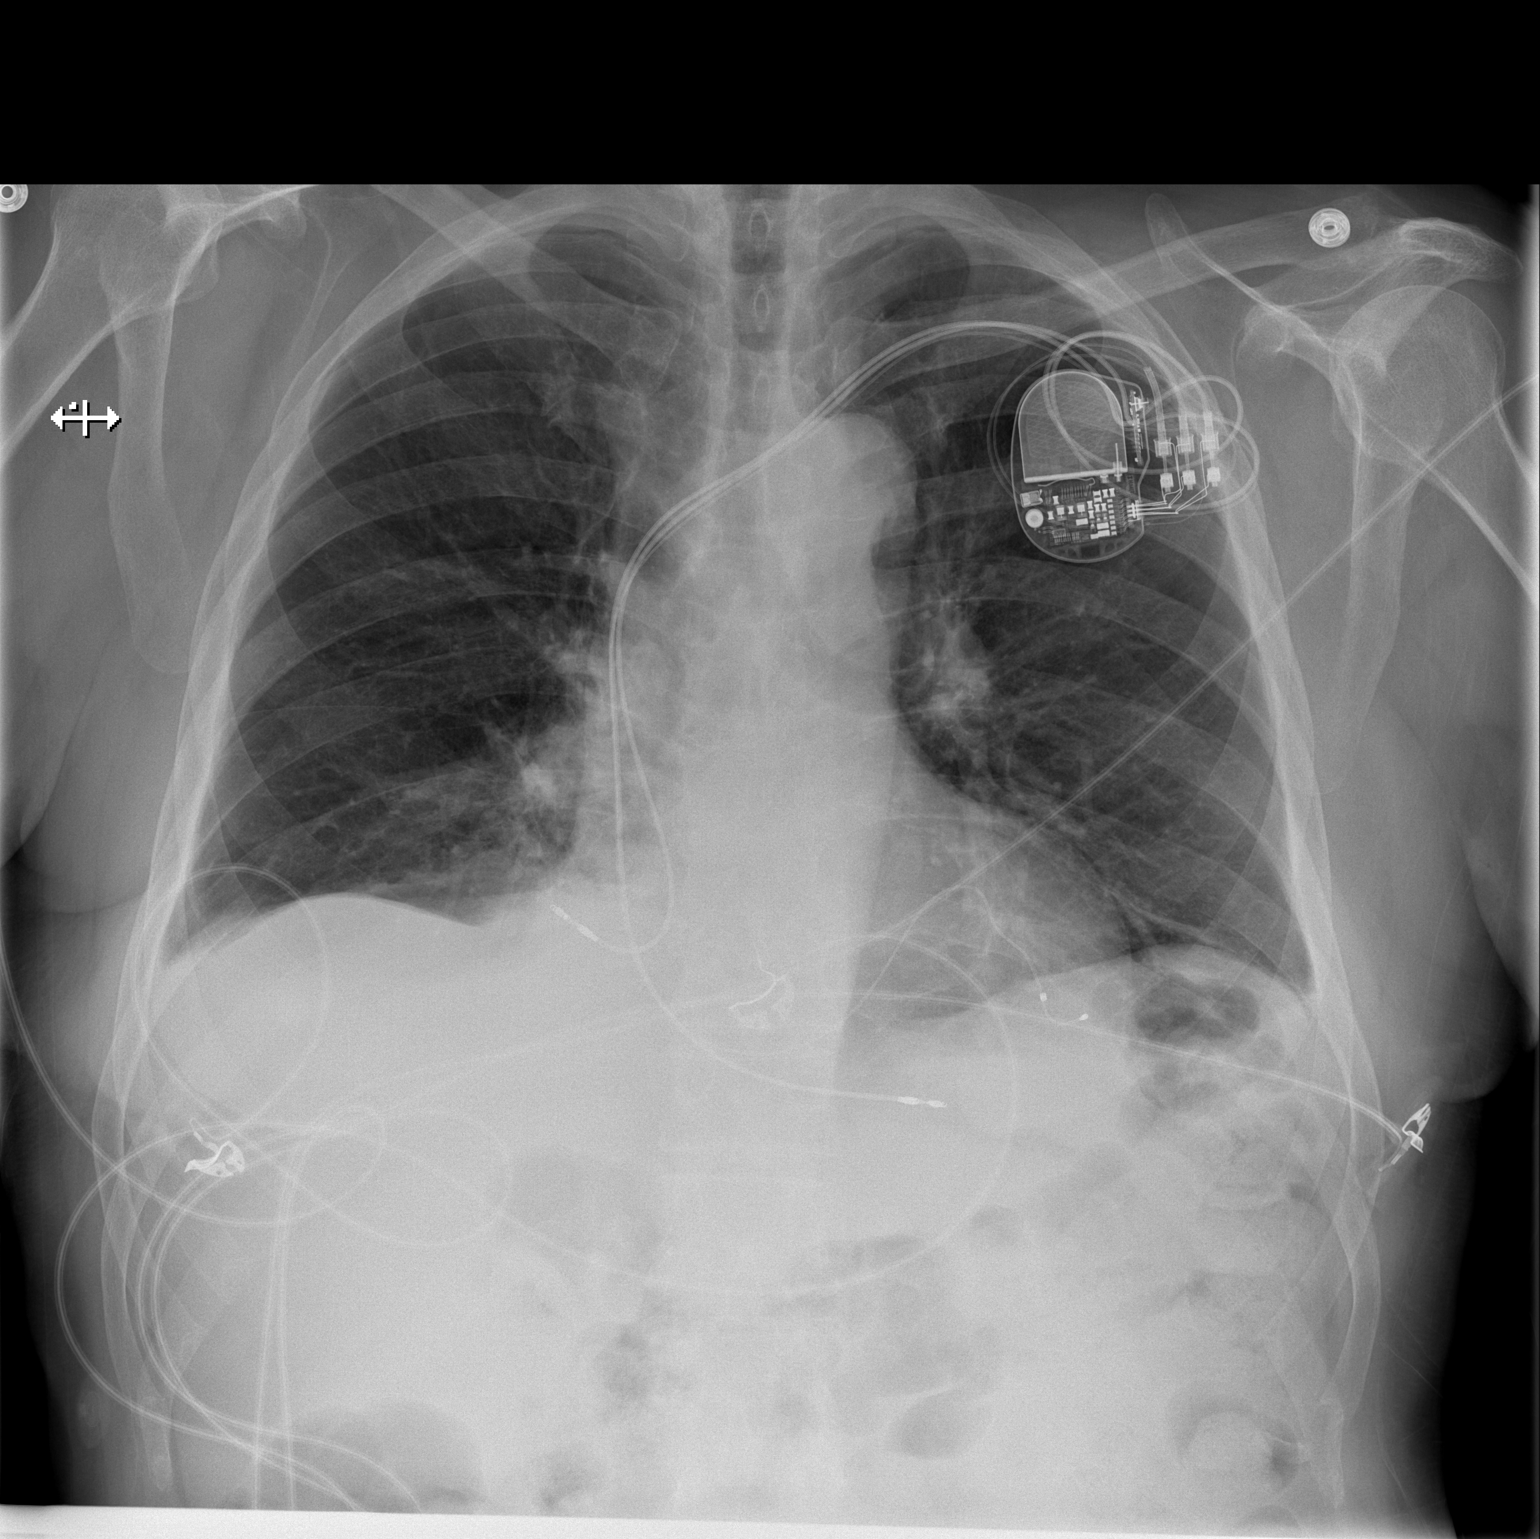

[w chest lat]
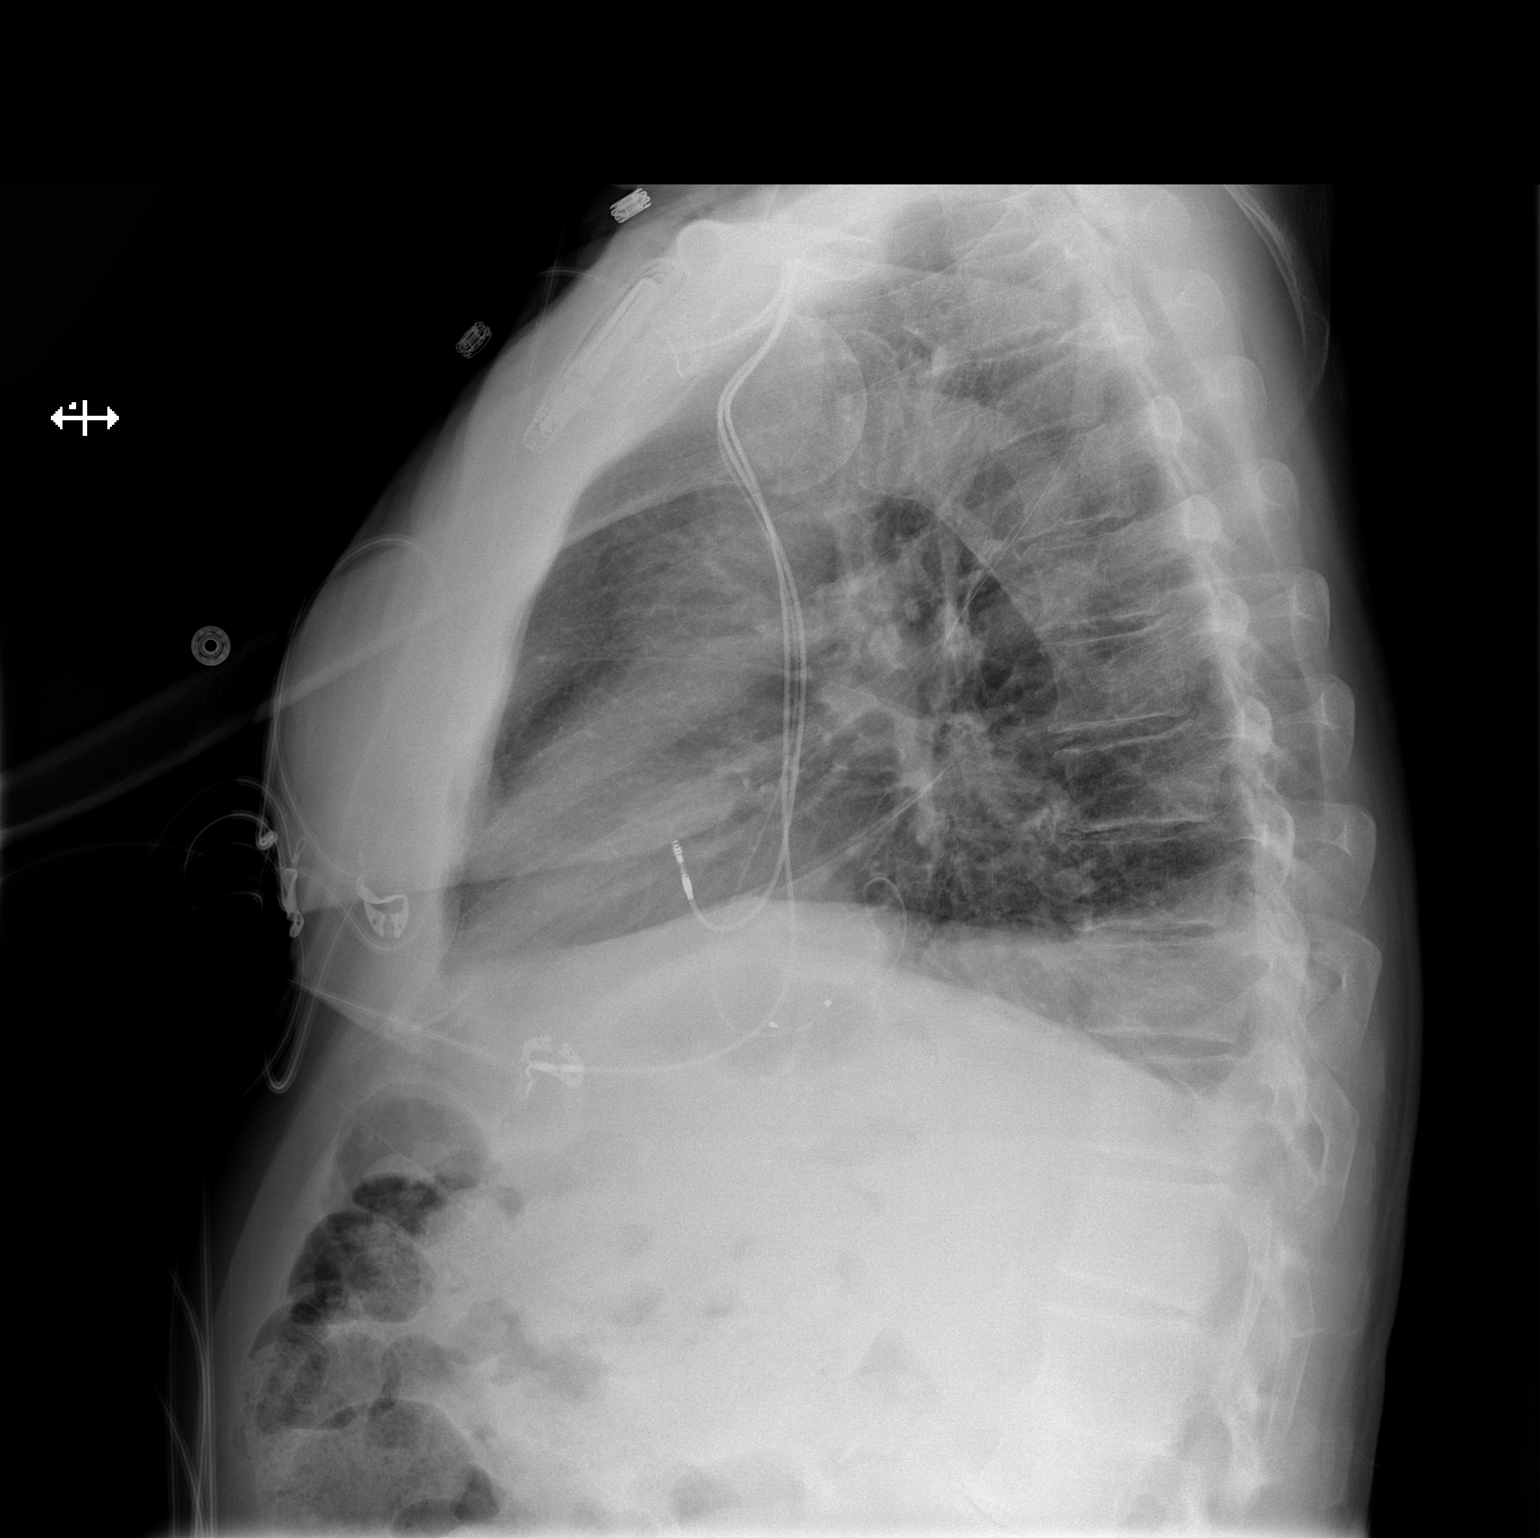

[2 of 2 positions shown; findings below may reference images not displayed]

FINDINGS: Left subclavian transvenous pacemaker leads project at right
atrium, right ventricle, and coronary sinus.
Upper normal heart size.
Mediastinal contours and pulmonary vascularity normal.
Bibasilar atelectasis and small right subpulmonic pleural effusion.
Upper lungs clear.
No pneumothorax or acute osseous findings.
IMPRESSION: Bibasilar atelectasis and small right pleural effusion.

## 2015-02-02 IMAGING — CR DG CHEST 2V
2 series · 2 of 2 positions shown · non-contrast
Comparison: 06/29/2012

CLINICAL DATA: Status post pacemaker placement unable to raise arm

CHEST - 2 VIEW

[w chest pa]
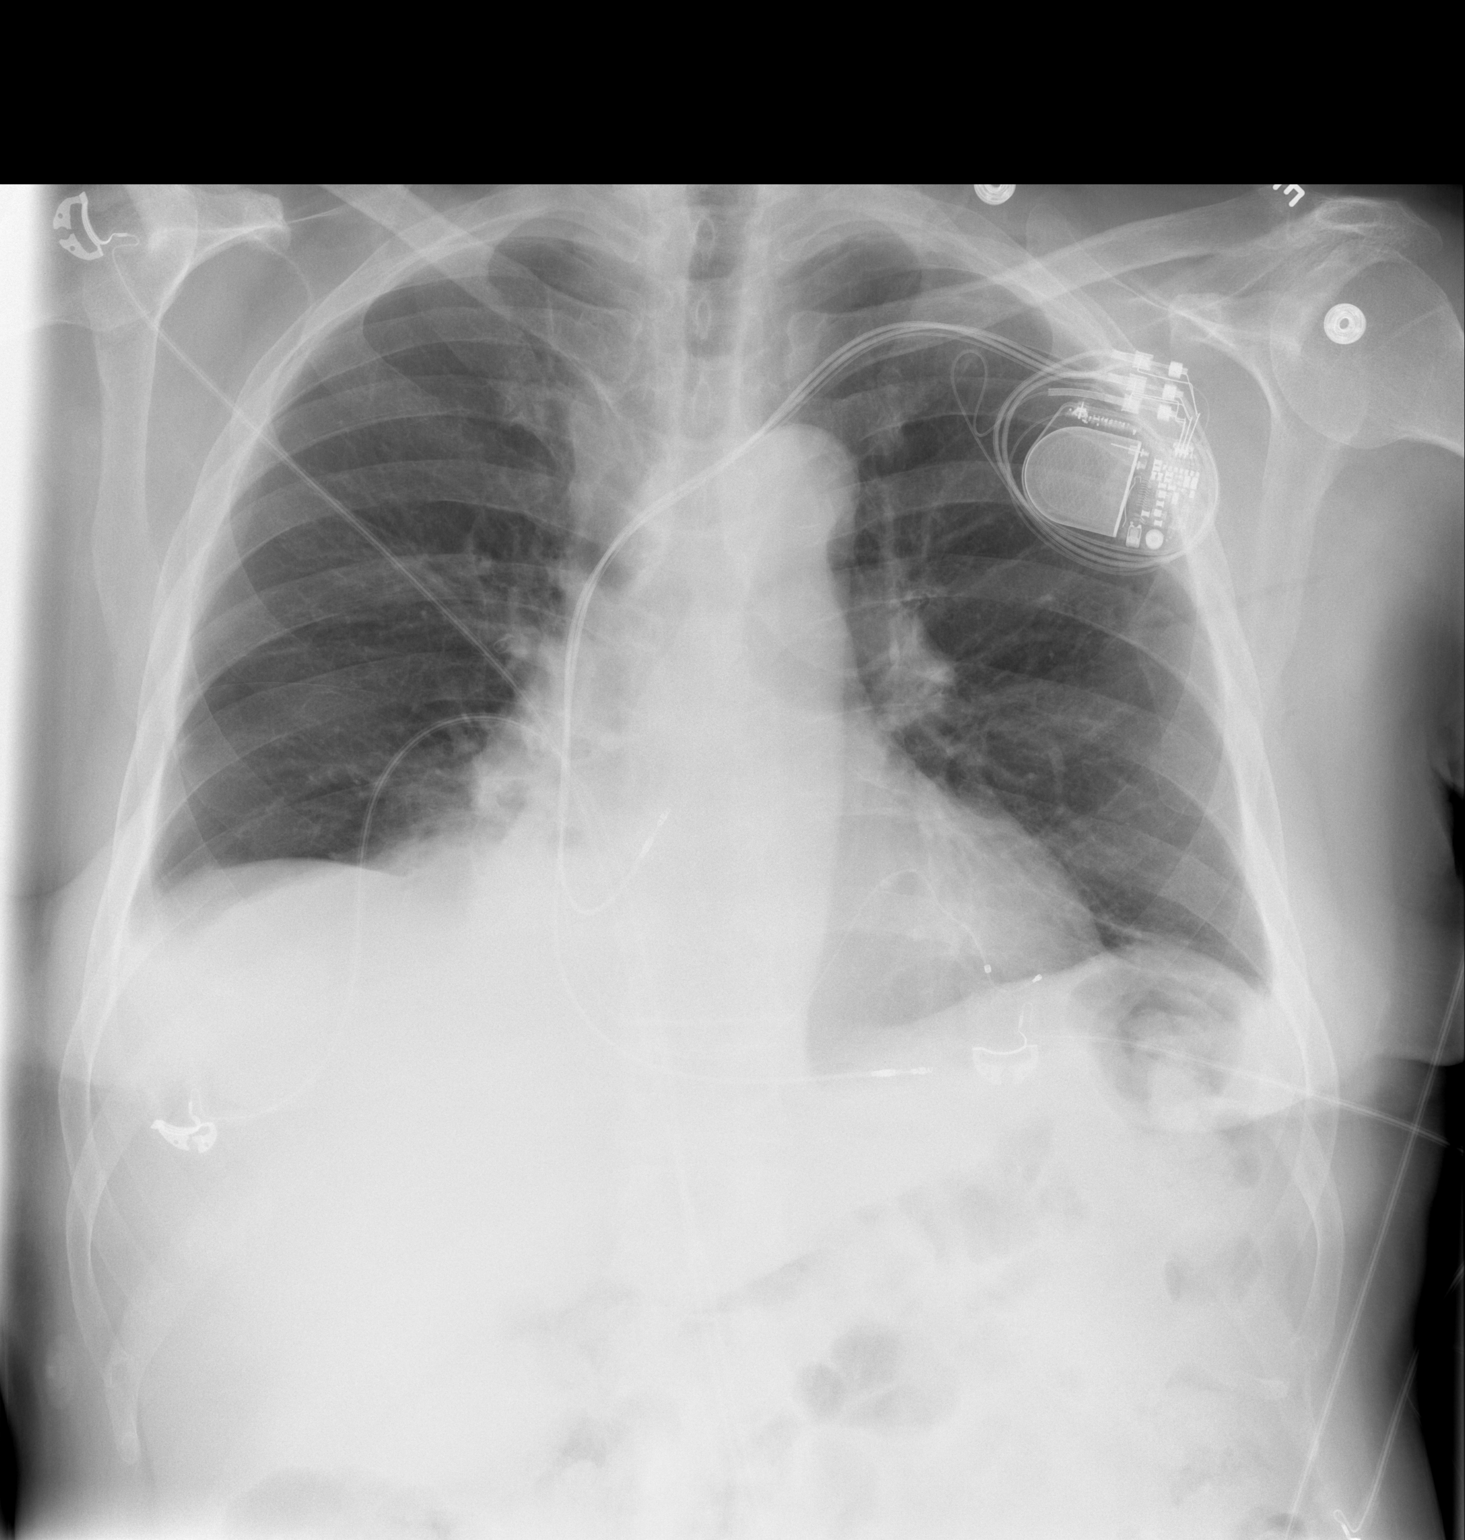

[w chest lat]
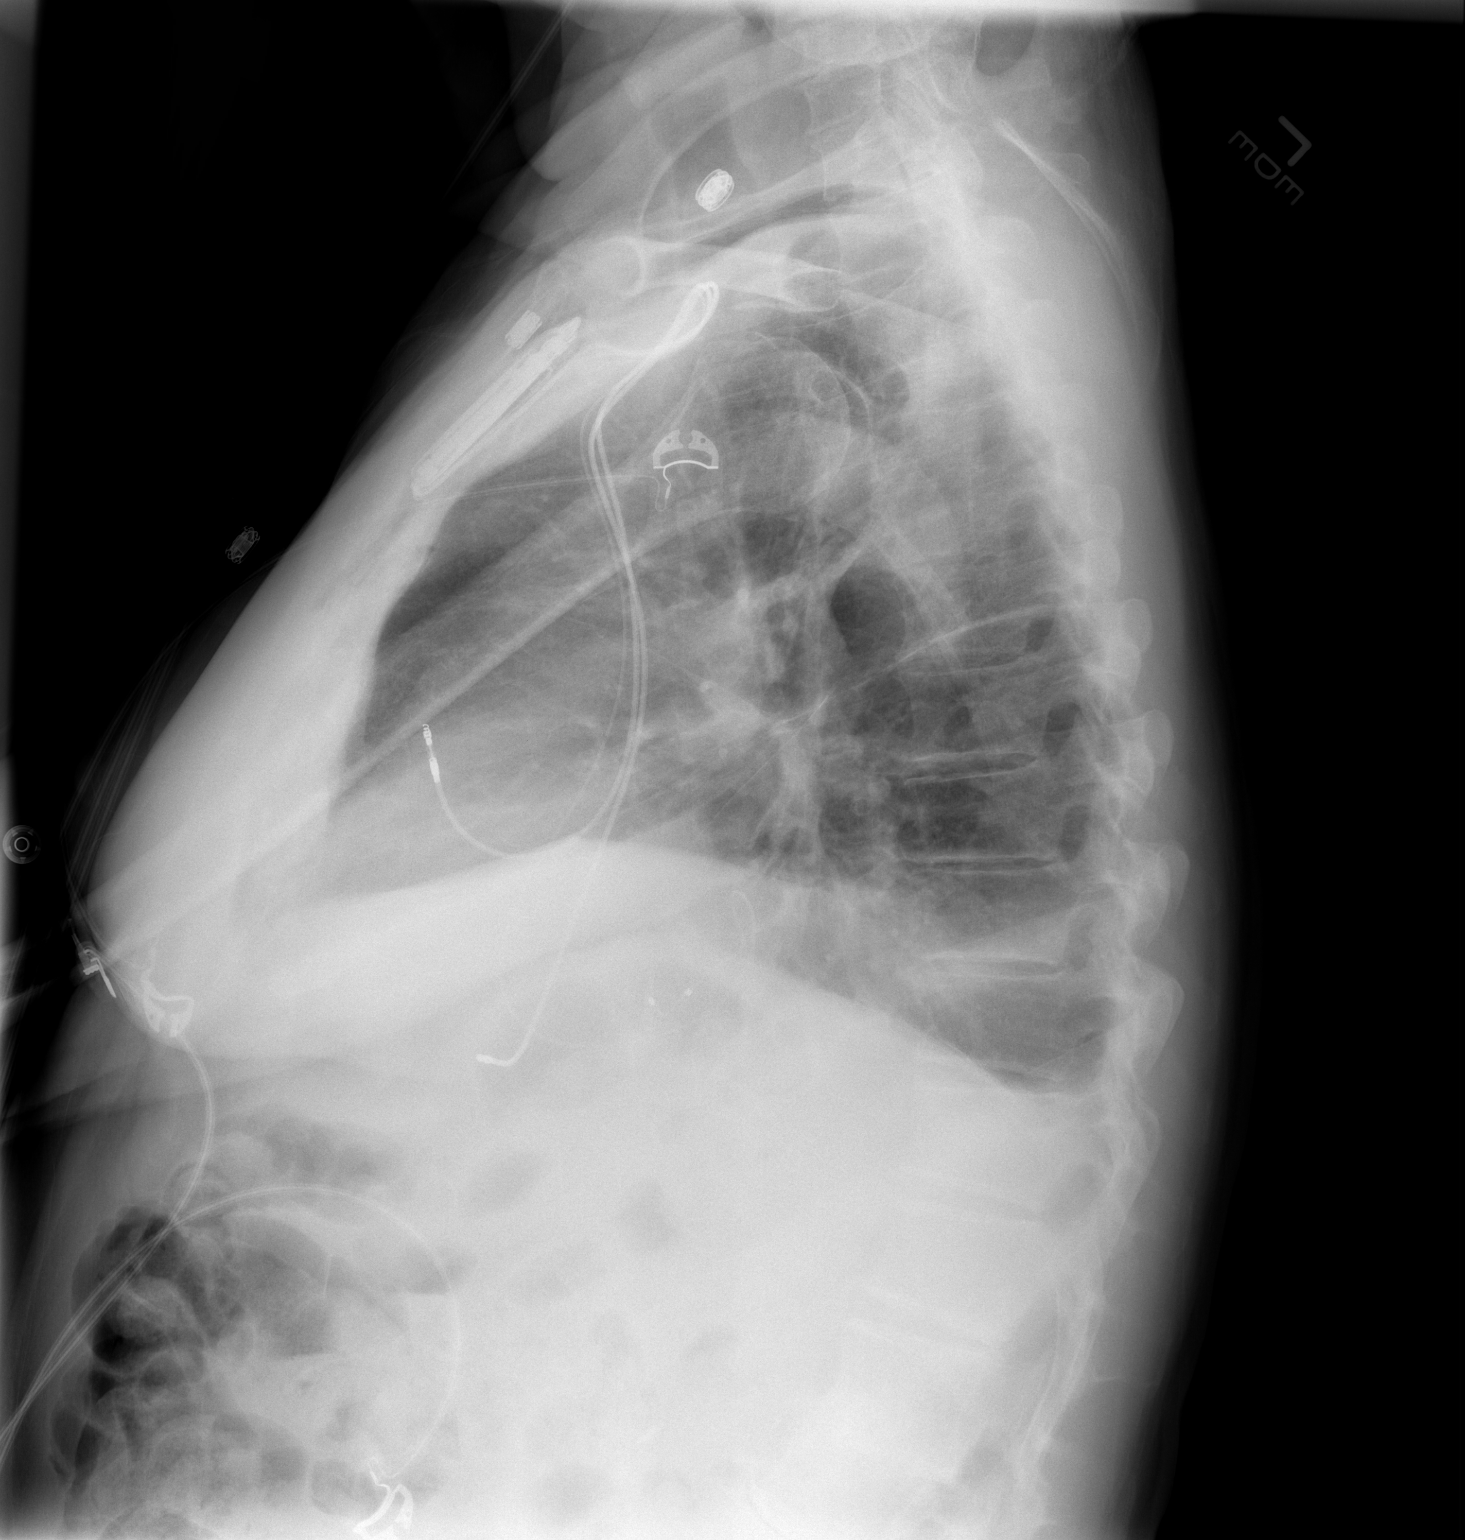

[2 of 2 positions shown; findings below may reference images not displayed]

FINDINGS: The cardiac shadow is stable.  Pacemaker leads are again
identified although the atrial lead appears different than that
seen on the recent exam from previous day.  Correlation with the
patients clinical history is recommended.  Persistent increased
change is noted in the medial aspect of the right lung base
consistent with evolving infiltrate.  The left lung remains clear.
No pneumothorax is noted.  The humeral head is somewhat high-riding
on the left which may be related to an underlying rotator cuff
injury.
IMPRESSION: Change in the appearance of the atrial lead when compared with the
previous day.  Clinical correlation is recommended.

Increasing right basilar infiltrate.

## 2015-02-05 ENCOUNTER — Ambulatory Visit (INDEPENDENT_AMBULATORY_CARE_PROVIDER_SITE_OTHER): Payer: Medicare Other | Admitting: *Deleted

## 2015-02-05 ENCOUNTER — Encounter: Payer: Self-pay | Admitting: Internal Medicine

## 2015-02-05 ENCOUNTER — Ambulatory Visit (INDEPENDENT_AMBULATORY_CARE_PROVIDER_SITE_OTHER): Payer: Medicare Other | Admitting: Internal Medicine

## 2015-02-05 VITALS — BP 90/68 | HR 66 | Ht 66.0 in | Wt 159.8 lb

## 2015-02-05 DIAGNOSIS — I5022 Chronic systolic (congestive) heart failure: Secondary | ICD-10-CM

## 2015-02-05 DIAGNOSIS — I480 Paroxysmal atrial fibrillation: Secondary | ICD-10-CM

## 2015-02-05 DIAGNOSIS — I4891 Unspecified atrial fibrillation: Secondary | ICD-10-CM | POA: Diagnosis not present

## 2015-02-05 DIAGNOSIS — I2510 Atherosclerotic heart disease of native coronary artery without angina pectoris: Secondary | ICD-10-CM | POA: Diagnosis not present

## 2015-02-05 DIAGNOSIS — I495 Sick sinus syndrome: Secondary | ICD-10-CM

## 2015-02-05 DIAGNOSIS — Z950 Presence of cardiac pacemaker: Secondary | ICD-10-CM | POA: Diagnosis not present

## 2015-02-05 DIAGNOSIS — Z5181 Encounter for therapeutic drug level monitoring: Secondary | ICD-10-CM

## 2015-02-05 DIAGNOSIS — I2583 Coronary atherosclerosis due to lipid rich plaque: Secondary | ICD-10-CM

## 2015-02-05 LAB — CUP PACEART INCLINIC DEVICE CHECK
Date Time Interrogation Session: 20161129142919
Implantable Lead Implant Date: 20140424
Implantable Lead Location: 753860
Implantable Lead Model: 5076
Lead Channel Impedance Value: 862.5 Ohm
Lead Channel Pacing Threshold Amplitude: 1 V
Lead Channel Sensing Intrinsic Amplitude: 2.4 mV
Lead Channel Setting Pacing Amplitude: 2 V
Lead Channel Setting Pacing Pulse Width: 0.5 ms
Lead Channel Setting Sensing Sensitivity: 2.5 mV
MDC IDC LEAD IMPLANT DT: 20140422
MDC IDC LEAD IMPLANT DT: 20140424
MDC IDC LEAD LOCATION: 753858
MDC IDC LEAD LOCATION: 753859
MDC IDC MSMT BATTERY REMAINING LONGEVITY: 82.8
MDC IDC MSMT BATTERY VOLTAGE: 2.96 V
MDC IDC MSMT LEADCHNL LV PACING THRESHOLD PULSEWIDTH: 0.5 ms
MDC IDC MSMT LEADCHNL RA IMPEDANCE VALUE: 537.5 Ohm
MDC IDC MSMT LEADCHNL RA PACING THRESHOLD AMPLITUDE: 0.75 V
MDC IDC MSMT LEADCHNL RA PACING THRESHOLD PULSEWIDTH: 0.5 ms
MDC IDC MSMT LEADCHNL RV IMPEDANCE VALUE: 462.5 Ohm
MDC IDC MSMT LEADCHNL RV PACING THRESHOLD AMPLITUDE: 0.75 V
MDC IDC MSMT LEADCHNL RV PACING THRESHOLD PULSEWIDTH: 0.4 ms
MDC IDC MSMT LEADCHNL RV SENSING INTR AMPL: 10.4 mV
MDC IDC SET LEADCHNL RA PACING AMPLITUDE: 2 V
MDC IDC SET LEADCHNL RV PACING AMPLITUDE: 2.5 V
MDC IDC SET LEADCHNL RV PACING PULSEWIDTH: 0.4 ms
MDC IDC STAT BRADY RA PERCENT PACED: 97 %
MDC IDC STAT BRADY RV PERCENT PACED: 93 %
Pulse Gen Model: 3210
Pulse Gen Serial Number: 2926516

## 2015-02-05 LAB — POCT INR: INR: 3.7

## 2015-02-05 MED ORDER — CARVEDILOL 12.5 MG PO TABS: ORAL_TABLET | ORAL | Status: DC

## 2015-02-05 NOTE — Patient Instructions (Addendum)
Medication Instructions:  Your physician has recommended you make the following change in your medication:  1) Decrease Carvedilol to 6.25mg (1/2 of a 12.5mg  tablet) in the morning and 12.5mg  in the evening(1 whole 12.5mg  tablet)    Labwork: None ordered   Testing/Procedures: None ordered   Follow-Up: Your physician wants you to follow-up in: 12 months with Dr Knox Saliva will receive a reminder letter in the mail two months in advance. If you don't receive a letter, please call our office to schedule the follow-up appointment.  Remote monitoring is used to monitor your Pacemaker  from home. This monitoring reduces the number of office visits required to check your device to one time per year. It allows [place] to keep an eye on the functioning of your device to ensure it is working properly. You are scheduled for a device check from home on 05/07/15. You may send your transmission at any time that day. If you have a wireless device, the transmission will be sent automatically. After your physician reviews your transmission, you will receive a postcard with your next transmission date.    Any Other Special Instructions Will Be Listed Below (If Applicable).     If you need a refill on your cardiac medications before your next appointment, please call your pharmacy.

## 2015-02-05 NOTE — Assessment & Plan Note (Addendum)
His symptoms are class 2. He will continue his current meds. I have encouraged the patient to maintain a low sodium diet. He notes some low blood pressures in the morning after his coreg. I have asked him to reduce his dose to 6.25/12.5 mg morning and night.

## 2015-02-05 NOTE — Progress Notes (Signed)
HPI [name] returns today for followup. He is a very pleasant 81 year old man with chronic systolic heart failure, symptomatic bradycardia, high-grade conduction system disease, who developed a pacemaker pocket infection and underwent extraction, initial insertion of a temporary permanent pacemaker, followed by insertion of a biventricular pacemaker. His procedure was complicated by atrial lead perforation x2, resulting in a pleural effusion.  No syncope or palpitations. He underwent DCCV several months ago. He has had no atrial fib. He is tolerating his higher dose of amiodarone. No syncope.  Allergies  Allergen Reactions  . Antihistamines, Diphenhydramine-Type Other (See Comments)    Causes difficulty in ability to urinate.     Current Outpatient Prescriptions  Medication Sig Dispense Refill  . amiodarone (PACERONE) 200 MG tablet Take 1 tablet (200 mg total) by mouth daily. 90 tablet 3  . carvedilol (COREG) 12.5 MG tablet Take 1/2 tablet in the morning and 1 whole tablet in the pm 135 tablet 3  . furosemide (LASIX) 20 MG tablet Take 2 tablets (40 mg total) by mouth daily. 90 tablet 2  . losartan (COZAAR) 25 MG tablet 1/2 tablet (12.5mg  ) by mouth  daily 45 tablet 3  . potassium chloride SA (K-DUR,KLOR-CON) 20 MEQ tablet Take 1 tablet (20 mEq total) by mouth daily.    . pravastatin (PRAVACHOL) 80 MG tablet Take 0.5 tablets (40 mg total) by mouth daily. 45 tablet 2  . spironolactone (ALDACTONE) 25 MG tablet Take 0.5 tablets (12.5 mg total) by mouth every evening. 15 tablet 5  . tamsulosin (FLOMAX) 0.4 MG CAPS Take 0.4 mg by mouth daily.     Marland Kitchen warfarin (COUMADIN) 5 MG tablet TAKE 1/2 TO 1 TABLET (2.5 TO 5 MG TOTAL) DAILY AS DIRECTED BY ANTICOAGULATION CLINIC 90 tablet 0   No current facility-administered medications for this visit.     Past Medical History  Diagnosis Date  . Coronary artery disease   . Cardiomyopathy primary-nonischemic EF 45%  . Hyperlipidemia   . GERD  (gastroesophageal reflux disease)   . Polymyalgia rheumatica ([place])   . Sick sinus syndrome ([place])   . Atrial fibrillation ([place])   . Dysphagia   . Hearing loss, mixed, bilateral   . Increased prostate specific antigen (PSA) velocity   . Lumbar back pain   . BPH (benign prostatic hypertrophy)   . Breast mass     "on both sides" (06/28/2012)  . CHF (congestive heart failure) ([place])   . Elevated liver enzymes   . Esophageal stricture   . Pacemaker infection ([place])     06/28/12  . Hx of cardiovascular stress test     Adenosine Myoview (07/2013):  Apical cap, apical lateral and mid anterolateral scar, small amount of peri-infarct ischemia, EF 36%; Medium Risk  . Pacemaker     st jude  . Diverticulosis of colon without hemorrhage 01/03/2014  . Internal hemorrhoids 01/03/2014  . Arthritis     "back" (03/20/2014)    ROS:   All systems reviewed and negative except as noted in the HPI.   Past Surgical History  Procedure Laterality Date  . Cataract extraction, bilateral Bilateral 1980's  . Umbilical hernia repair  [id number] X 3  . Insert / replace / remove pacemaker    . Cardioversion  06/17/2011    Procedure: CARDIOVERSION;  Surgeon: Lelon Perla, MD;  Location: [place];  Service: Cardiovascular;  Laterality: N/A;  . Cardioversion  09/09/2011    Procedure: CARDIOVERSION;  Surgeon: Carlena Bjornstad, MD;  Location: [place];  Service: Cardiovascular;  Laterality: N/A;  . Cardioversion  01/01/2012    Procedure: CARDIOVERSION;  Surgeon: Hillary Bow, MD;  Location: [facility] ENDOSCOPY;  Service: Cardiovascular;  Laterality: N/A;  . Tee without cardioversion  01/01/2012    Procedure: TRANSESOPHAGEAL ECHOCARDIOGRAM (TEE);  Surgeon: Fay Records, MD;  Location: [facility] ENDOSCOPY;  Service: Cardiovascular;  Laterality: N/A;  . Generator removal Left 06/15/2012    Procedure: GENERATOR REMOVAL;  Surgeon: Evans Lance, MD;  Location: [place];  Service: Cardiovascular;  Laterality: Left;  . Pacemaker generator  change N/A 06/15/2012    Procedure: PACEMAKER GENERATOR CHANGE;  Surgeon: Evans Lance, MD;  Location: [place];  Service: Cardiovascular;  Laterality: N/A;  . Tonsillectomy and adenoidectomy  1938  . Esophagogastroduodenoscopy    . Colonoscopy N/A 01/03/2014    Procedure: COLONOSCOPY;  Surgeon: Gatha Mayer, MD;  Location: [facility] ENDOSCOPY;  Service: Endoscopy;  Laterality: N/A;  . Permanent pacemaker insertion N/A 06/28/2012    Procedure: PERMANENT PACEMAKER INSERTION;  Surgeon: Evans Lance, MD;  Location: [facility] CATH LAB;  Service: Cardiovascular;  Laterality: N/A;  . Lead revision N/A 06/29/2012    Procedure: LEAD REVISION;  Surgeon: Deboraha Sprang, MD;  Location: [facility] CATH LAB;  Service: Cardiovascular;  Laterality: N/A;  . Pacemaker revision  06/30/2012    Procedure: PACEMAKER LEAD REVISION;  Surgeon: Evans Lance, MD;  Location: [facility] CATH LAB;  Service: Cardiovascular;;  . Cardiac catheterization  1980's    [place]  . Paracentesis  ~ 0000000    complication from pacer change in 2014  . Inguinal hernia repair Right 2012; 03/20/2014  . Breast surgery    . Inguinal hernia repair Right 03/20/2014    Procedure: OPEN REPAIR OF RIGHT INGUINAL WITH MESH;  Surgeon: Donnie Mesa, MD;  Location: [place];  Service: General;  Laterality: Right;  . Insertion of mesh Right 03/20/2014    Procedure: INSERTION OF MESH;  Surgeon: Donnie Mesa, MD;  Location: [place];  Service: General;  Laterality: Right;  . Tee without cardioversion N/A 08/03/2014    Procedure: TRANSESOPHAGEAL ECHOCARDIOGRAM (TEE);  Surgeon: Larey Dresser, MD;  Location: [place];  Service: Cardiovascular;  Laterality: N/A;  . Cardioversion N/A 08/03/2014    Procedure: CARDIOVERSION;  Surgeon: Larey Dresser, MD;  Location: [facility] ENDOSCOPY;  Service: Cardiovascular;  Laterality: N/A;     Family History  Problem Relation Age of Onset  . Heart attack Father 56    deceased  . AAA (abdominal aortic aneurysm) Mother     deceased AAA  .  Hypertension Mother   . Other Brother     deceased at birth     Social History   Social History  . Marital Status: Married    Spouse Name: N/A  . Number of Children: 3  . Years of Education: N/A   Occupational History  . retired     Engineering geologist   Social History Main Topics  . Smoking status: Former Smoker -- 1.00 packs/day for 40 years    Types: Cigarettes    Quit date: 03/10/1983  . Smokeless tobacco: Never Used  . Alcohol Use: 0.0 oz/week    0 Standard drinks or equivalent per week     Comment: quit drinking 2000-? alcohol related cardiomyopathy  . Drug Use: No  . Sexual Activity: Not Currently   Other Topics Concern  . Not on file   Social History Narrative   Married. 3 children (6 together). 5 grandkids with with 5  grandkids (2nd marriage). No greatgrandkids.       Retired from [employer]      Hobbies: play golf, bridge, garden, write family stories     BP 90/68 mmHg  Pulse 66  Ht 5\' 6"  (1.676 m)  Wt 159 lb 12.8 oz (72.485 kg)  BMI 25.80 kg/m2  Physical Exam:  Well appearing  Elderly man, NAD HEENT: Unremarkable Neck:  7 cm JVD, no thyromegally Back:  No CVA tenderness Lungs:  Clear with no wheezes, rales, or rhonchi. Well-healed pacemaker incision. Well-healed extraction incision. HEART:  Regular rate rhythm, no murmurs, no rubs, no clicks Abd:  soft, positive bowel sounds, no organomegally, no rebound, no guarding Ext:  2 plus pulses, trace edema, no cyanosis, no clubbing Skin:  No rashes no nodules Neuro:  CN II through XII intact, motor grossly intact   DEVICE  Normal device function. See PaceArt for details.   Assess/Plan:

## 2015-02-05 NOTE — Assessment & Plan Note (Signed)
He has minimal atrial fibrillation. He will continue his amiodarone and warfarin.

## 2015-02-05 NOTE — Assessment & Plan Note (Signed)
His St. Jude BiV PM is working normally. Will recheck in several months.  

## 2015-02-05 NOTE — Assessment & Plan Note (Signed)
He denies anginal symptoms currently. Will follow.

## 2015-02-18 ENCOUNTER — Telehealth: Payer: Self-pay | Admitting: Family Medicine

## 2015-02-18 NOTE — Telephone Encounter (Signed)
Patient stopped in office at 4:30 pm today.  He stated the cleeding had stopped, was controlled, and was stable.  He will return tomorrow, 02/19/15, for his appointment.

## 2015-02-18 NOTE — Telephone Encounter (Signed)
Appointment scheduled tomorrow (02/19/15) for coumadin clinic and exam with PCP, Dr. Yong Channel. Attempted to call patient to ensure bleeding was controlled and patient stable to wait until tomoorrow. Awaiting call back.
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
## 2015-02-19 ENCOUNTER — Encounter: Payer: Self-pay | Admitting: Family Medicine

## 2015-02-19 ENCOUNTER — Ambulatory Visit (INDEPENDENT_AMBULATORY_CARE_PROVIDER_SITE_OTHER): Payer: Medicare Other | Admitting: *Deleted

## 2015-02-19 ENCOUNTER — Ambulatory Visit (INDEPENDENT_AMBULATORY_CARE_PROVIDER_SITE_OTHER): Payer: Medicare Other | Admitting: Family Medicine

## 2015-02-19 VITALS — BP 100/64 | HR 105 | Temp 98.4°F | Wt 159.0 lb

## 2015-02-19 DIAGNOSIS — I4891 Unspecified atrial fibrillation: Secondary | ICD-10-CM

## 2015-02-19 DIAGNOSIS — S8011XA Contusion of right lower leg, initial encounter: Secondary | ICD-10-CM

## 2015-02-19 DIAGNOSIS — I480 Paroxysmal atrial fibrillation: Secondary | ICD-10-CM | POA: Diagnosis not present

## 2015-02-19 DIAGNOSIS — Z5181 Encounter for therapeutic drug level monitoring: Secondary | ICD-10-CM | POA: Diagnosis not present

## 2015-02-19 LAB — POCT INR: INR: 3.1

## 2015-02-19 NOTE — Telephone Encounter (Signed)
Noted  

## 2015-02-19 NOTE — Patient Instructions (Addendum)
I suspect this may take a month or two to reabsorb given its size  If it worsens- please let me know  I am going to send a message to [name] with coumadin clinic and see if we can get your INR closer to 2 by taking 2.5mg  daily (goal 1.5-2.5 until this area heals)    Hematoma A hematoma is a collection of blood under the skin, in an organ, in a body space, in a joint space, or in other tissue. The blood can clot to form a lump that you can see and feel. The lump is often firm and may sometimes become sore and tender. Most hematomas get better in a few days to weeks. However, some hematomas may be serious and require medical care. Hematomas can range in size from very small to very large. CAUSES  A hematoma can be caused by a blunt or penetrating injury. It can also be caused by spontaneous leakage from a blood vessel under the skin. Spontaneous leakage from a blood vessel is more likely to occur in older people, especially those taking blood thinners. Sometimes, a hematoma can develop after certain medical procedures. SIGNS AND SYMPTOMS   A firm lump on the body.  Possible pain and tenderness in the area.  Bruising.Blue, dark blue, purple-red, or yellowish skin may appear at the site of the hematoma if the hematoma is close to the surface of the skin. For hematomas in deeper tissues or body spaces, the signs and symptoms may be subtle. For example, an intra-abdominal hematoma may cause abdominal pain, weakness, fainting, and shortness of breath. An intracranial hematoma may cause a headache or symptoms such as weakness, trouble speaking, or a change in consciousness. DIAGNOSIS  A hematoma can usually be diagnosed based on your medical history and a physical exam. Imaging tests may be needed if your health care provider suspects a hematoma in deeper tissues or body spaces, such as the abdomen, head, or chest. These tests may include ultrasonography or a CT scan.  TREATMENT  Hematomas  usually go away on their own over time. Rarely does the blood need to be drained out of the body. Large hematomas or those that may affect vital organs will sometimes need surgical drainage or monitoring. HOME CARE INSTRUCTIONS   Apply ice to the injured area:   Put ice in a plastic bag.   Place a towel between your skin and the bag.   Leave the ice on for 20 minutes, 2-3 times a day for the first 1 to 2 days.   After the first 2 days, switch to using warm compresses on the hematoma.   Elevate the injured area to help decrease pain and swelling. Wrapping the area with an elastic bandage may also be helpful. Compression helps to reduce swelling and promotes shrinking of the hematoma. Make sure the bandage is not wrapped too tight.   If your hematoma is on a lower extremity and is painful, crutches may be helpful for a couple days.   Only take over-the-counter or prescription medicines as directed by your health care provider. SEEK IMMEDIATE MEDICAL CARE IF:   You have increasing pain, or your pain is not controlled with medicine.   You have a fever.   You have worsening swelling or discoloration.   Your skin over the hematoma breaks or starts bleeding.   Your hematoma is in your chest or abdomen and you have weakness, shortness of breath, or a change in consciousness.  Your hematoma  is on your scalp (caused by a fall or injury) and you have a worsening headache or a change in alertness or consciousness. MAKE SURE YOU:   Understand these instructions.  Will watch your condition.  Will get help right away if you are not doing well or get worse.   This information is not intended to replace advice given to you by your health care provider. Make sure you discuss any questions you have with your health care provider.   Document Released: 10/08/2003 Document Revised: 10/26/2012 Document Reviewed: 08/03/2012 Elsevier Interactive Patient Education [employer].

## 2015-02-19 NOTE — Progress Notes (Signed)
Garret Reddish, MD  Subjective:  [name] is a 81 y.o. year old very pleasant male patient who presents for/with See problem oriented charting ROS- denies palpitations. No fever, chills, nausea, expanding redness on leg  Past Medical History-  Patient Active Problem List   Diagnosis Date Noted  . Chronic systolic CHF (congestive heart failure) ([place]) 03/21/2013    Priority: High  . Nonischemic cardiomyopathy ([place]) 09/20/2010    Priority: High  . PPM-St.Jude 11/27/2008    Priority: High  . CAD (coronary artery disease) 07/20/2008    Priority: High  . SICK SINUS SYNDROME 07/20/2008    Priority: High  . Atrial fibrillation ([place]) 03/16/2007    Priority: High  . Chronic cough 08/18/2013    Priority: Medium  . Dyspnea 08/16/2013    Priority: Medium  . Pleural effusion, right 08/16/2013    Priority: Medium  . Pleural effusion on right 07/06/2012    Priority: Medium  . Hyperlipidemia 03/16/2007    Priority: Medium  . BPH (benign prostatic hyperplasia) 03/16/2007    Priority: Medium  . Allergic rhinitis 02/14/2014    Priority: Low  . Fecal incontinence 10/18/2013    Priority: Low  . Recurrent right inguinal hernia 11/22/2012    Priority: Low  . Intention tremor 11/01/2012    Priority: Low  . Polymyalgia rheumatica ([place]) 07/03/2008    Priority: Low  . MIXED HEARING LOSS BILATERAL 01/10/2008    Priority: Low  . GERD 03/16/2007    Priority: Low  . Trigger thumb of right hand 07/04/2014  . Encounter for therapeutic drug monitoring 07/02/2014  . Fecal soiling due to fecal incontinence 04/20/2014    Medications- reviewed and updated Current Outpatient Prescriptions  Medication Sig Dispense Refill  . amiodarone (PACERONE) 200 MG tablet Take 1 tablet (200 mg total) by mouth daily. 90 tablet 3  . carvedilol (COREG) 12.5 MG tablet Take 1/2 tablet in the morning and 1 whole tablet in the pm 135 tablet 3  . furosemide (LASIX) 20 MG tablet Take 2 tablets (40 mg total) by  mouth daily. 90 tablet 2  . losartan (COZAAR) 25 MG tablet 1/2 tablet (12.5mg  ) by mouth  daily 45 tablet 3  . potassium chloride SA (K-DUR,KLOR-CON) 20 MEQ tablet Take 1 tablet (20 mEq total) by mouth daily.    . pravastatin (PRAVACHOL) 80 MG tablet Take 0.5 tablets (40 mg total) by mouth daily. 45 tablet 2  . spironolactone (ALDACTONE) 25 MG tablet Take 0.5 tablets (12.5 mg total) by mouth every evening. 15 tablet 5  . tamsulosin (FLOMAX) 0.4 MG CAPS Take 0.4 mg by mouth daily.     Marland Kitchen warfarin (COUMADIN) 5 MG tablet TAKE 1/2 TO 1 TABLET (2.5 TO 5 MG TOTAL) DAILY AS DIRECTED BY ANTICOAGULATION CLINIC 90 tablet 0   No current facility-administered medications for this visit.    Objective: BP 100/64 mmHg  Pulse 105  Temp(Src) 98.4 F (36.9 C)  Wt 159 lb (72.122 kg)  SpO2 92% Gen: NAD, resting comfortably in chair CV: RRR no murmurs rubs or gallops Lungs: CTAB no crackles, wheeze, rhonchi Abdomen: soft/nontender/nondistended/normal bowel sounds. No rebound or guarding.  Ext: no edema pretibially other than locally around lesion Has about 1/2 tennis ball size hematoma on right just lateral shin, fluid filled texture suggesting hematoma. Epidermis not present on portion of leg but no active bleeding Skin: warm, dry Neuro: grossly normal, moves all extremities, intact distal sensation        Assessment/Plan:  Traumatic hematoma  of right lower leg, initial encounter S: Patient was getting out of his car on Friday when door did not click (was on hill) and door swung back and hit leg pretty heavily on R lower shin lateral. Some immediate bleeding but was able to stop with pressure. Area had swelling to about 1/2 of a tennis ball by that evening. Minimal pain with it. Has not increased in size since then but has persisted. Patient on coumadin and INR today was 3.1 but had been slightly higher on last 2 checks.  A/P: Discussed heat, elevation, gentle ace wrapping which I applied today. On  coumadin suspect 1-2 months for this to heal. Reached out to coumadin clinic Ivin Booty walker RN to have her touch base with supervising MD and see if ok with short term goal closer to 2 INR if not more 1.5-2.5 INR. A fib burden has been minimal per Dr. Tanna Furry last note so this may be reasonable- appears in sinus today. May help this heal a little better. No obvious need for surgical intervention though size does concern me and if worsens could consider surgical consult.   Does mention mild numbness tingling in bilateral feet at times- we will trend this and consider neuropathy work up.   Return precautions advised.

## 2015-02-20 ENCOUNTER — Telehealth: Payer: Self-pay | Admitting: *Deleted

## 2015-02-20 NOTE — Telephone Encounter (Signed)
Pt called after obtaining message from Dr Yong Channel that need to monitor pt closely due to hematoma and that wants INR goal range until hematoma subsides from 2.0 to 2.5  Spoke with [place] PharmD regarding this . Appt made for recheck INR on Tuesday Dec 20th and pt states understanding

## 2015-02-21 NOTE — Telephone Encounter (Signed)
-----   Message from Larey Dresser, MD sent at 02/21/2015  9:49 AM EST ----- INR 1.5-2 short-term then back to 2-3.  ----- Message -----    From: Margretta Sidle, RN    Sent: 02/21/2015   7:08 AM      To: Larey Dresser, MD, Katrine Coho, RN  Dr [name] in coumadin clinic on 02/19/2015 and he informed nurse that he had hit right lower leg with car door area edematous and red and was to see PCP Dr Yong Channel day of visit to clinic Dr Yong Channel sent note that he had large hematoma right shin and would like to keep INR closer to 2.0 and he asked if could change coumadin dose to 2,5mg  daily   Appt made to be seen next week for repeat INR next week to address dosing Dr Yong Channel states he would like INR 2.0 to 2.5 although states would  not be opposed to short  term INR goal 1.5 to 2.0 once hematoma heals then probably go back to INR goal of 2.0 to 3.0  Please advise Dr Yong Channel office note  In computer  Thank you Craig Ewings RN

## 2015-02-21 NOTE — Telephone Encounter (Signed)
Thank you so much for your help and excellent care

## 2015-02-21 NOTE — Telephone Encounter (Signed)
Will route to Dr Yong Channel as well

## 2015-02-25 ENCOUNTER — Ambulatory Visit: Payer: Medicare Other | Admitting: Family Medicine

## 2015-02-27 ENCOUNTER — Ambulatory Visit (INDEPENDENT_AMBULATORY_CARE_PROVIDER_SITE_OTHER): Payer: Medicare Other | Admitting: *Deleted

## 2015-02-27 DIAGNOSIS — I480 Paroxysmal atrial fibrillation: Secondary | ICD-10-CM

## 2015-02-27 DIAGNOSIS — Z5181 Encounter for therapeutic drug level monitoring: Secondary | ICD-10-CM | POA: Diagnosis not present

## 2015-02-27 DIAGNOSIS — I4891 Unspecified atrial fibrillation: Secondary | ICD-10-CM | POA: Diagnosis not present

## 2015-02-27 LAB — POCT INR: INR: 1.9

## 2015-03-05 ENCOUNTER — Ambulatory Visit (INDEPENDENT_AMBULATORY_CARE_PROVIDER_SITE_OTHER): Payer: Medicare Other | Admitting: Pharmacist

## 2015-03-05 DIAGNOSIS — I480 Paroxysmal atrial fibrillation: Secondary | ICD-10-CM

## 2015-03-05 DIAGNOSIS — Z5181 Encounter for therapeutic drug level monitoring: Secondary | ICD-10-CM | POA: Diagnosis not present

## 2015-03-05 DIAGNOSIS — I4891 Unspecified atrial fibrillation: Secondary | ICD-10-CM | POA: Diagnosis not present

## 2015-03-05 LAB — POCT INR: INR: 2.1

## 2015-03-14 ENCOUNTER — Telehealth: Payer: Self-pay | Admitting: Family Medicine

## 2015-03-14 DIAGNOSIS — I5022 Chronic systolic (congestive) heart failure: Secondary | ICD-10-CM

## 2015-03-14 DIAGNOSIS — I480 Paroxysmal atrial fibrillation: Secondary | ICD-10-CM

## 2015-03-14 MED ORDER — CARVEDILOL 12.5 MG PO TABS: ORAL_TABLET | ORAL | Status: DC

## 2015-03-14 MED ORDER — FUROSEMIDE 20 MG PO TABS
40.0000 mg | ORAL_TABLET | Freq: Every day | ORAL | Status: DC
Start: 2015-03-14 — End: 2015-04-04

## 2015-03-14 MED ORDER — AMIODARONE HCL 200 MG PO TABS: 200.0000 mg | ORAL_TABLET | Freq: Every day | ORAL | Status: DC

## 2015-03-14 MED ORDER — TAMSULOSIN HCL 0.4 MG PO CAPS: 0.4000 mg | ORAL_CAPSULE | Freq: Every day | ORAL | Status: DC

## 2015-03-14 MED ORDER — SPIRONOLACTONE 25 MG PO TABS: 12.5000 mg | ORAL_TABLET | Freq: Every evening | ORAL | Status: DC

## 2015-03-14 MED ORDER — POTASSIUM CHLORIDE CRYS ER 20 MEQ PO TBCR: 20.0000 meq | EXTENDED_RELEASE_TABLET | Freq: Every day | ORAL | Status: DC

## 2015-03-14 MED ORDER — WARFARIN SODIUM 5 MG PO TABS: ORAL_TABLET | ORAL | Status: DC

## 2015-03-14 MED ORDER — PRAVASTATIN SODIUM 80 MG PO TABS: 2.0000 mg | ORAL_TABLET | Freq: Every day | ORAL | Status: DC

## 2015-03-14 MED ORDER — LOSARTAN POTASSIUM 25 MG PO TABS: ORAL_TABLET | ORAL | Status: DC

## 2015-03-14 NOTE — Telephone Encounter (Signed)
Pt returned call and is using [facility] home delivery. Pharmacy has been updated and Rx sent in to [facility].

## 2015-03-14 NOTE — Telephone Encounter (Signed)
Pt has recently changed to a new rx drug plan with [employer]. Pt was wondering if all his meds could be tx to that drug plan. Fax # is [phone]

## 2015-03-14 NOTE — Telephone Encounter (Signed)
Pt added that he is out of Amiodarone. He will need a refill sent to [facility] on [address].

## 2015-03-14 NOTE — Telephone Encounter (Signed)
Called and lm for pt to call back to let us know what pharmacy this fax number/drug plan is associated with.

## 2015-03-15 ENCOUNTER — Other Ambulatory Visit: Payer: Self-pay

## 2015-03-15 MED ORDER — AMIODARONE HCL 200 MG PO TABS: 200.0000 mg | ORAL_TABLET | Freq: Every day | ORAL | Status: DC

## 2015-03-15 NOTE — Telephone Encounter (Signed)
Rx has been sent in. 

## 2015-03-15 NOTE — Telephone Encounter (Addendum)
Pt called requesting that Amiodarone be called into [facility] on [address] since he is currently out of is rx. Please advise

## 2015-03-21 ENCOUNTER — Ambulatory Visit: Payer: Medicare Other | Admitting: Family Medicine

## 2015-03-22 ENCOUNTER — Other Ambulatory Visit: Payer: Self-pay

## 2015-03-22 MED ORDER — AMIODARONE HCL 200 MG PO TABS: 200.0000 mg | ORAL_TABLET | Freq: Every day | ORAL | Status: DC

## 2015-03-26 ENCOUNTER — Ambulatory Visit (INDEPENDENT_AMBULATORY_CARE_PROVIDER_SITE_OTHER): Payer: Medicare Other | Admitting: *Deleted

## 2015-03-26 DIAGNOSIS — I480 Paroxysmal atrial fibrillation: Secondary | ICD-10-CM

## 2015-03-26 DIAGNOSIS — Z5181 Encounter for therapeutic drug level monitoring: Secondary | ICD-10-CM | POA: Diagnosis not present

## 2015-03-26 DIAGNOSIS — I4891 Unspecified atrial fibrillation: Secondary | ICD-10-CM

## 2015-03-26 LAB — POCT INR: INR: 2.3

## 2015-04-04 ENCOUNTER — Telehealth: Payer: Self-pay | Admitting: Family Medicine

## 2015-04-04 DIAGNOSIS — I480 Paroxysmal atrial fibrillation: Secondary | ICD-10-CM

## 2015-04-04 MED ORDER — FUROSEMIDE 20 MG PO TABS: 40.0000 mg | ORAL_TABLET | Freq: Every day | ORAL | Status: DC

## 2015-04-04 NOTE — Telephone Encounter (Signed)
Pt said he normally gets a 90 day supply which is 180 pills. He said he only received 90 pills  furosemide (LASIX) 20 MG tablet  [employer] [facility] home delivery

## 2015-04-04 NOTE — Telephone Encounter (Signed)
Rx resent.

## 2015-04-16 ENCOUNTER — Ambulatory Visit (INDEPENDENT_AMBULATORY_CARE_PROVIDER_SITE_OTHER): Payer: Medicare Other | Admitting: *Deleted

## 2015-04-16 DIAGNOSIS — I4891 Unspecified atrial fibrillation: Secondary | ICD-10-CM

## 2015-04-16 DIAGNOSIS — I480 Paroxysmal atrial fibrillation: Secondary | ICD-10-CM | POA: Diagnosis not present

## 2015-04-16 DIAGNOSIS — Z5181 Encounter for therapeutic drug level monitoring: Secondary | ICD-10-CM | POA: Diagnosis not present

## 2015-04-16 LAB — POCT INR: INR: 2.1

## 2015-05-07 ENCOUNTER — Ambulatory Visit (INDEPENDENT_AMBULATORY_CARE_PROVIDER_SITE_OTHER): Payer: Medicare Other | Admitting: *Deleted

## 2015-05-07 ENCOUNTER — Telehealth: Payer: Self-pay | Admitting: Cardiology

## 2015-05-07 DIAGNOSIS — I495 Sick sinus syndrome: Secondary | ICD-10-CM

## 2015-05-07 DIAGNOSIS — Z950 Presence of cardiac pacemaker: Secondary | ICD-10-CM | POA: Diagnosis not present

## 2015-05-07 NOTE — Telephone Encounter (Signed)
Confirmed remote transmission w/ pt wife.   

## 2015-05-08 ENCOUNTER — Telehealth: Payer: Self-pay | Admitting: Cardiology

## 2015-05-08 ENCOUNTER — Encounter: Payer: Self-pay | Admitting: Cardiology

## 2015-05-08 NOTE — Progress Notes (Signed)
Remote pacemaker transmission.   

## 2015-05-08 NOTE — Telephone Encounter (Signed)
Husband called and said that his wife, [name] DOB: 11/19/1933 would like to switch cardiologist from Dr. Radford Pax to Dr. Aundra Dubin

## 2015-05-08 NOTE — Telephone Encounter (Signed)
New Message  Pt called req a call back to discuss an appt, No further detail.

## 2015-05-09 ENCOUNTER — Ambulatory Visit: Payer: Medicare Other | Admitting: Family Medicine

## 2015-05-10 ENCOUNTER — Ambulatory Visit: Payer: Medicare Other | Admitting: Family Medicine

## 2015-05-10 ENCOUNTER — Ambulatory Visit (INDEPENDENT_AMBULATORY_CARE_PROVIDER_SITE_OTHER): Payer: Medicare Other | Admitting: *Deleted

## 2015-05-10 DIAGNOSIS — I480 Paroxysmal atrial fibrillation: Secondary | ICD-10-CM

## 2015-05-10 DIAGNOSIS — I4891 Unspecified atrial fibrillation: Secondary | ICD-10-CM | POA: Diagnosis not present

## 2015-05-10 DIAGNOSIS — Z5181 Encounter for therapeutic drug level monitoring: Secondary | ICD-10-CM

## 2015-05-10 LAB — POCT INR: INR: 2

## 2015-05-13 DIAGNOSIS — J189 Pneumonia, unspecified organism: Secondary | ICD-10-CM | POA: Diagnosis not present

## 2015-05-14 ENCOUNTER — Telehealth: Payer: Self-pay

## 2015-05-14 ENCOUNTER — Other Ambulatory Visit: Payer: Self-pay | Admitting: Family Medicine

## 2015-05-14 DIAGNOSIS — R059 Cough, unspecified: Secondary | ICD-10-CM

## 2015-05-14 DIAGNOSIS — R05 Cough: Secondary | ICD-10-CM

## 2015-05-14 NOTE — Telephone Encounter (Signed)
FYI: do you want to pt to have chest xray or see you first?

## 2015-05-14 NOTE — Telephone Encounter (Signed)
PLEASE NOTE: All timestamps contained within this report are represented as [place] Standard Time. CONFIDENTIALTY NOTICE: This fax transmission is intended only for the addressee. It contains information that is legally privileged, confidential or otherwise protected from use or disclosure. If you are not the intended recipient, you are strictly prohibited from reviewing, disclosing, copying using or disseminating any of this information or taking any action in reliance on or regarding this information. If you have received this fax in error, please notify us immediately by telephone so that we can arrange for its return to [place]. Phone: [phone], Toll-Free: [phone], Fax: [phone] Page: 1 of 2 Call Id: [id number] [facility] - Client [place] Patient Name: [name] Gender: Male DOB: [birth year] Age: 82 Y 34 D Return Phone Number: [phone] (Primary), [phone] (Secondary) Address: [place] [address] Client [facility] - Client Client Site [facility] - Night Physician Garret Reddish Contact Type Call Who Is Calling Patient / Member / Family / Caregiver Call Type Triage / Clinical Relationship To Patient Self Return Phone Number [phone] (Primary) Chief Complaint Cough Reason for Call Symptomatic / Request for [place] states he has a fever of 100.2 and he has had cold for 6 days. He also is coughing up phlegm. [place] Not Listed Doc in a Box PreDisposition Did not know what to do Translation No Nurse Assessment Nurse: Tamala Julian, RN, Joelene Millin Date/Time (Eastern Time): 05/13/2015 5:00:22 PM Confirm and document reason for call. If symptomatic, describe symptoms. You must click the next button to save text entered. ---Caller states he has a fever of 100.2 (orally) and he has had cold for 6  days. He also is coughing up phlegm (brown, bloody). Ribcage is sore from uncontrollable cough. Takes Coumadin. Has the patient traveled out of the country within the last 30 days? ---No Does the patient have any new or worsening symptoms? ---Yes Will a triage be completed? ---Yes Related visit to physician within the last 2 weeks? ---No Does the PT have any chronic conditions? (i.e. diabetes, asthma, etc.) ---Yes List chronic conditions. ---Atrial fib (on Coumadin), CHF (lung fluid - Lasix), pacemaker Is this a behavioral health or substance abuse call? ---No Guidelines Guideline Title Affirmed Question Affirmed Notes Nurse Date/Time Eilene Ghazi Time) Cough - Acute Productive Wheezing is present Menard Lions 05/13/2015 5:04:33 PM Disp. Time Eilene Ghazi Time) Disposition Final User 05/13/2015 5:09:13 PM See Physician within 4 Hours (or PCP triage) Yes Tamala Julian, RN, Joelene Millin PLEASE NOTE: All timestamps contained within this report are represented as [place] Standard Time. CONFIDENTIALTY NOTICE: This fax transmission is intended only for the addressee. It contains information that is legally privileged, confidential or otherwise protected from use or disclosure. If you are not the intended recipient, you are strictly prohibited from reviewing, disclosing, copying using or disseminating any of this information or taking any action in reliance on or regarding this information. If you have received this fax in error, please notify us immediately by telephone so that we can arrange for its return to [place]. Phone: [phone], Toll-Free: [phone], Fax: [phone] Page: 2 of 2 Call Id: [id number] [place] Understands: Yes Disagree/Comply: Comply Care Advice Given Per Guideline SEE PHYSICIAN WITHIN 4 HOURS (or PCP triage): * IF OFFICE WILL BE OPEN: You need to be seen within the next 3 or 4 hours. Call your doctor's office now or as soon as it opens. * IF OFFICE WILL BE CLOSED AND  NO PCP TRIAGE: You  need to be seen within the next 3 or 4 hours. A nearby Urgent Care Center is often a good source of care. Another choice is to go to the ER. Go sooner if you become worse. CALL BACK IF: * You become worse. CARE ADVICE given per Cough - Acute Productive (Adult) guideline. Referrals GO TO FACILITY OTHER - SPECIFY

## 2015-05-14 NOTE — Telephone Encounter (Signed)
Pt states he had chest xray done last night at urgent care and they are treating him for "minimal" pneumonia and he is taking Cefdinir for 14 days. Pt will keep his appointment for this Thursday to f/u with Dr. Yong Channel.

## 2015-05-16 ENCOUNTER — Encounter: Payer: Self-pay | Admitting: Family Medicine

## 2015-05-16 ENCOUNTER — Ambulatory Visit (INDEPENDENT_AMBULATORY_CARE_PROVIDER_SITE_OTHER): Payer: Medicare Other | Admitting: Family Medicine

## 2015-05-16 VITALS — BP 118/64 | HR 80 | Temp 98.2°F | Wt 159.0 lb

## 2015-05-16 DIAGNOSIS — R739 Hyperglycemia, unspecified: Secondary | ICD-10-CM | POA: Diagnosis not present

## 2015-05-16 DIAGNOSIS — G629 Polyneuropathy, unspecified: Secondary | ICD-10-CM | POA: Insufficient documentation

## 2015-05-16 DIAGNOSIS — R053 Chronic cough: Secondary | ICD-10-CM

## 2015-05-16 DIAGNOSIS — R202 Paresthesia of skin: Secondary | ICD-10-CM | POA: Diagnosis not present

## 2015-05-16 DIAGNOSIS — R05 Cough: Secondary | ICD-10-CM

## 2015-05-16 DIAGNOSIS — R209 Unspecified disturbances of skin sensation: Secondary | ICD-10-CM | POA: Diagnosis not present

## 2015-05-16 DIAGNOSIS — G589 Mononeuropathy, unspecified: Secondary | ICD-10-CM | POA: Diagnosis not present

## 2015-05-16 LAB — VITAMIN B12: Vitamin B-12: 435 pg/mL (ref 211–911)

## 2015-05-16 LAB — SEDIMENTATION RATE: Sed Rate: 17 mm/hr (ref 0–22)

## 2015-05-16 LAB — TSH: TSH: 4.41 u[IU]/mL (ref 0.35–4.50)

## 2015-05-16 LAB — HEMOGLOBIN A1C: HEMOGLOBIN A1C: 6.1 % (ref 4.6–6.5)

## 2015-05-16 NOTE — Assessment & Plan Note (Addendum)
S: For at least 6 months patient has noted some reduced sensation in his feet. He has some tingling/parestehsias. He feels like his balance is not quite as good as well. Left foot slightly worse than right. Has been previously in balance class at [facility]- we discussed restarting  A/P: does have reduced monofilament and fibration testing. Has a history of some low back pain so could be from back. Not at point patient is requesting medication but wants more information. We will obtain SPEP, TSH, b12, ana, ESR, RPR, a1c. Consider neurology follow up if worsening.

## 2015-05-16 NOTE — Assessment & Plan Note (Addendum)
S: dry cough when Laying down for about 5 minutes. Improved off lisinopril A/P: Recent pneumonia confusing picture but did seem to be improving off ace-i. Would monitor for now at least another month. Consider allergy tx vs. Reflux treatment if persists abother 4-6 weeks.  Patient also does admit to feeling some slight more shortness of breath but is recovering from a pneumonia. We discussed monitoring this- does not appear fluid overloaded- if does not improve as symptoms improve from pneumonia- consider cards follow up

## 2015-05-16 NOTE — Patient Instructions (Signed)
Labs before you go  Would definitely do the balance classes regularly  Need to monitor the shortness of breath- if does not improve in next few weeks- consider cardiology follow up

## 2015-05-16 NOTE — Progress Notes (Signed)
Garret Reddish, MD  Subjective:  [name] is a 82 y.o. year old very pleasant male patient who presents for/with See problem oriented charting ROS- No chest pain.  No headache or blurry vision. Paresthesias as noted below  Past Medical History-  Patient Active Problem List   Diagnosis Date Noted  . Chronic systolic CHF (congestive heart failure) ([place]) 03/21/2013    Priority: High  . Nonischemic cardiomyopathy ([place]) 09/20/2010    Priority: High  . PPM-St.Jude 11/27/2008    Priority: High  . CAD (coronary artery disease) 07/20/2008    Priority: High  . SICK SINUS SYNDROME 07/20/2008    Priority: High  . Atrial fibrillation ([place]) 03/16/2007    Priority: High  . Neuropathy ([place]) 05/16/2015    Priority: Medium  . Chronic cough 08/18/2013    Priority: Medium  . Dyspnea 08/16/2013    Priority: Medium  . Pleural effusion, right 08/16/2013    Priority: Medium  . Pleural effusion on right 07/06/2012    Priority: Medium  . Hyperlipidemia 03/16/2007    Priority: Medium  . BPH (benign prostatic hyperplasia) 03/16/2007    Priority: Medium  . Allergic rhinitis 02/14/2014    Priority: Low  . Fecal incontinence 10/18/2013    Priority: Low  . Recurrent right inguinal hernia 11/22/2012    Priority: Low  . Intention tremor 11/01/2012    Priority: Low  . Polymyalgia rheumatica ([place]) 07/03/2008    Priority: Low  . MIXED HEARING LOSS BILATERAL 01/10/2008    Priority: Low  . GERD 03/16/2007    Priority: Low  . Trigger thumb of right hand 07/04/2014  . Encounter for therapeutic drug monitoring 07/02/2014  . Fecal soiling due to fecal incontinence 04/20/2014    Medications- reviewed and updated Current Outpatient Prescriptions  Medication Sig Dispense Refill  . amiodarone (PACERONE) 200 MG tablet Take 1 tablet (200 mg total) by mouth daily. 90 tablet 3  . carvedilol (COREG) 12.5 MG tablet Take 1/2 tablet in the morning and 1 whole tablet in the pm 135 tablet 3  . furosemide  (LASIX) 20 MG tablet Take 2 tablets (40 mg total) by mouth daily. 180 tablet 3  . losartan (COZAAR) 25 MG tablet 1/2 tablet (12.34m ) by mouth  daily 135 tablet 3  . potassium chloride SA (K-DUR,KLOR-CON) 20 MEQ tablet Take 1 tablet (20 mEq total) by mouth daily. 90 tablet 3  . pravastatin (PRAVACHOL) 80 MG tablet Take 0.5 tablets (40 mg total) by mouth daily. 45 tablet 3  . spironolactone (ALDACTONE) 25 MG tablet Take 0.5 tablets (12.5 mg total) by mouth every evening. 45 tablet 3  . tamsulosin (FLOMAX) 0.4 MG CAPS capsule Take 1 capsule (0.4 mg total) by mouth daily. 90 capsule 3  . warfarin (COUMADIN) 5 MG tablet TAKE 1/2 TO 1 TABLET (2.5 TO 5 MG TOTAL) DAILY AS DIRECTED BY ANTICOAGULATION CLINIC 90 tablet 3   No current facility-administered medications for this visit.    Objective: BP 118/64 mmHg  Pulse 80  Temp(Src) 98.2 F (36.8 C)  Wt 159 lb (72.122 kg) Gen: NAD, resting comfortably CV: RRR no murmurs rubs or gallops Lungs: CTAB no crackles, rhonchi. Slight wheeze Abdomen: soft/nontender/nondistended/normal bowel sounds. No rebound or guarding.  Ext: no edema Skin: warm, dry Neuro: grossly normal, moves all extremities  Assessment/Plan:  Chronic cough S: dry cough when Laying down for about 5 minutes. Improved off lisinopril A/P: Recent pneumonia confusing picture but did seem to be improving off ace-i. Would monitor for  now at least another month. Consider allergy tx vs. Reflux treatment if persists abother 4-6 weeks.  Patient also does admit to feeling some slight more shortness of breath but is recovering from a pneumonia. We discussed monitoring this- does not appear fluid overloaded- if does not improve as symptoms improve from pneumonia- consider cards follow up  Neuropathy ([place]) S: For at least 6 months patient has noted some reduced sensation in his feet. He has some tingling/parestehsias. He feels like his balance is not quite as good as well. Left foot slightly  worse than right. Has been previously in balance class at [facility]- we discussed restarting  A/P: does have reduced monofilament and fibration testing. Has a history of some low back pain so could be from back. Not at point patient is requesting medication but wants more information. We will obtain SPEP, TSH, b12, ana, ESR, RPR, a1c. Consider neurology follow up if worsening.    Return precautions advised.   Orders Placed This Encounter  Procedures  . Protein Electrophoresis, Serum  . Vitamin B12  . TSH    [place]  . ANA  . Sedimentation rate    [place]  . RPR    [facility]  . Hemoglobin A1c    [place]   The duration of face-to-face time during this visit was 25 minutes. Greater than 50% of this time was spent in counseling, explanation of diagnosis, planning of further management, and/or coordination of care.

## 2015-05-17 LAB — ANA: ANA: NEGATIVE

## 2015-05-17 LAB — RPR

## 2015-05-20 LAB — PROTEIN ELECTROPHORESIS, SERUM
ALPHA-1-GLOBULIN: 0.3 g/dL (ref 0.2–0.3)
Albumin ELP: 3.7 g/dL — ABNORMAL LOW (ref 3.8–4.8)
Alpha-2-Globulin: 0.8 g/dL (ref 0.5–0.9)
BETA 2: 0.4 g/dL (ref 0.2–0.5)
BETA GLOBULIN: 0.4 g/dL (ref 0.4–0.6)
GAMMA GLOBULIN: 1 g/dL (ref 0.8–1.7)
Total Protein, Serum Electrophoresis: 6.6 g/dL (ref 6.1–8.1)

## 2015-05-23 ENCOUNTER — Encounter: Payer: Self-pay | Admitting: Family Medicine

## 2015-05-27 ENCOUNTER — Telehealth: Payer: Self-pay | Admitting: Internal Medicine

## 2015-05-27 NOTE — Telephone Encounter (Signed)
Returned patient's call.  Left message with his wife requesting call back.

## 2015-05-27 NOTE — Telephone Encounter (Signed)
New message      Pt received an EOB from his ins company.  What date did we actually check his device remotely?  Please call and ok to leave it on the vm.  Pt states that he was not home on the date it was scheduled to be checked.

## 2015-06-06 NOTE — Telephone Encounter (Signed)
Second attempt:  Returned patient's call.  Left message with Mrs. [name] requesting call back.  Gave direct phone number.

## 2015-06-06 NOTE — Telephone Encounter (Signed)
Patient returned call.  He wanted to know which day his remote transmission was scheduled for as he recently received an EOB from his insurance company.  Advised that patient's remote transmission appointment was on 05/07/15 and that his monitor typically transmits automatically.  He verbalizes understanding and appreciation.

## 2015-06-07 ENCOUNTER — Encounter: Payer: Self-pay | Admitting: Cardiology

## 2015-06-07 ENCOUNTER — Ambulatory Visit (INDEPENDENT_AMBULATORY_CARE_PROVIDER_SITE_OTHER): Payer: Medicare Other

## 2015-06-07 DIAGNOSIS — I4891 Unspecified atrial fibrillation: Secondary | ICD-10-CM | POA: Diagnosis not present

## 2015-06-07 DIAGNOSIS — I480 Paroxysmal atrial fibrillation: Secondary | ICD-10-CM | POA: Diagnosis not present

## 2015-06-07 DIAGNOSIS — Z5181 Encounter for therapeutic drug level monitoring: Secondary | ICD-10-CM

## 2015-06-07 LAB — CUP PACEART REMOTE DEVICE CHECK
Battery Remaining Longevity: 85 mo
Battery Voltage: 2.96 V
Brady Statistic AP VP Percent: 92 %
Brady Statistic AP VS Percent: 6 %
Brady Statistic RA Percent Paced: 97 %
Implantable Lead Implant Date: 20140422
Implantable Lead Implant Date: 20140424
Implantable Lead Implant Date: 20140424
Implantable Lead Location: 753858
Implantable Lead Location: 753859
Lead Channel Impedance Value: 440 Ohm
Lead Channel Impedance Value: 490 Ohm
Lead Channel Impedance Value: 810 Ohm
Lead Channel Pacing Threshold Pulse Width: 0.5 ms
Lead Channel Sensing Intrinsic Amplitude: 8.3 mV
Lead Channel Setting Pacing Amplitude: 2 V
MDC IDC LEAD LOCATION: 753860
MDC IDC MSMT BATTERY REMAINING PERCENTAGE: 95.5 %
MDC IDC MSMT LEADCHNL LV PACING THRESHOLD AMPLITUDE: 1 V
MDC IDC MSMT LEADCHNL LV PACING THRESHOLD PULSEWIDTH: 0.5 ms
MDC IDC MSMT LEADCHNL RA PACING THRESHOLD AMPLITUDE: 0.75 V
MDC IDC MSMT LEADCHNL RA SENSING INTR AMPL: 0.7 mV
MDC IDC MSMT LEADCHNL RV PACING THRESHOLD AMPLITUDE: 0.75 V
MDC IDC MSMT LEADCHNL RV PACING THRESHOLD PULSEWIDTH: 0.4 ms
MDC IDC PG SERIAL: 2926516
MDC IDC SESS DTM: 20170301152702
MDC IDC SET LEADCHNL LV PACING PULSEWIDTH: 0.5 ms
MDC IDC SET LEADCHNL RA PACING AMPLITUDE: 2 V
MDC IDC SET LEADCHNL RV PACING AMPLITUDE: 2.5 V
MDC IDC SET LEADCHNL RV PACING PULSEWIDTH: 0.4 ms
MDC IDC SET LEADCHNL RV SENSING SENSITIVITY: 2.5 mV
MDC IDC STAT BRADY AS VP PERCENT: 1 %
MDC IDC STAT BRADY AS VS PERCENT: 1 %

## 2015-06-07 LAB — POCT INR: INR: 2.7

## 2015-06-24 ENCOUNTER — Encounter: Payer: Self-pay | Admitting: Cardiology

## 2015-07-08 ENCOUNTER — Encounter: Payer: Self-pay | Admitting: Family Medicine

## 2015-07-08 ENCOUNTER — Ambulatory Visit (INDEPENDENT_AMBULATORY_CARE_PROVIDER_SITE_OTHER): Payer: Medicare Other | Admitting: Family Medicine

## 2015-07-08 VITALS — BP 92/50 | HR 89 | Temp 98.7°F | Wt 161.0 lb

## 2015-07-08 DIAGNOSIS — J069 Acute upper respiratory infection, unspecified: Secondary | ICD-10-CM | POA: Diagnosis not present

## 2015-07-08 DIAGNOSIS — D179 Benign lipomatous neoplasm, unspecified: Secondary | ICD-10-CM | POA: Diagnosis not present

## 2015-07-08 DIAGNOSIS — G629 Polyneuropathy, unspecified: Secondary | ICD-10-CM | POA: Diagnosis not present

## 2015-07-08 NOTE — Patient Instructions (Addendum)
We will call you within a week about your referral to neurology. If you do not hear within 2 weeks, give [place] a call.    Upper Respiratory infection History and exam today are suggestive of viral URI.  We discussed that a serious infection or illness is unlikely. We also discussed reasons why current illness does not meet criteria for bacterial illness and therefore no antibiotics indicated. Also educated on signs that bacterial infection may have developed.   Symptomatic treatment with: Mucinex-DM is reasonable  Finally, we reviewed reasons to return to care including if symptoms worsen or persist or new concerns arise especially fever or shortness of breath  Lipoma A lipoma is a noncancerous (benign) tumor that is made up of fat cells. This is a very common type of soft-tissue growth. Lipomas are usually found under the skin (subcutaneous). They may occur in any tissue of the body that contains fat. Common areas for lipomas to appear include the back, shoulders, buttocks, and thighs. Lipomas grow slowly, and they are usually painless. Most lipomas do not cause problems and do not require treatment. CAUSES The cause of this condition is not known. RISK FACTORS This condition is more likely to develop in:  People who are 74-36 years old.  People who have a family history of lipomas. SYMPTOMS A lipoma usually appears as a small, round bump under the skin. It may feel soft or rubbery, but the firmness can vary. Most lipomas are not painful. However, a lipoma may become painful if it is located in an area where it pushes on nerves. DIAGNOSIS A lipoma can usually be diagnosed with a physical exam. You may also have tests to confirm the diagnosis and to rule out other conditions. Tests may include:  Imaging tests, such as a CT scan or MRI.  Removal of a tissue sample to be looked at under a microscope (biopsy). TREATMENT Treatment is not needed for small lipomas that are not causing problems.  If a lipoma continues to get bigger or it causes problems, removal is often the best option. Lipomas can also be removed to improve appearance. Removal of a lipoma is usually done with a surgery in which the fatty cells and the surrounding capsule are removed. Most often, a medicine that numbs the area (local anesthetic) is used for this procedure. HOME CARE INSTRUCTIONS  Keep all follow-up visits as directed by your health care provider. This is important. SEEK MEDICAL CARE IF:  Your lipoma becomes larger or hard.  Your lipoma becomes painful, red, or increasingly swollen. These could be signs of infection or a more serious condition.   This information is not intended to replace advice given to you by your health care provider. Make sure you discuss any questions you have with your health care provider.   Document Released: 02/13/2002 Document Revised: 07/10/2014 Document Reviewed: 02/19/2014 Elsevier Interactive Patient Education [employer].

## 2015-07-08 NOTE — Assessment & Plan Note (Addendum)
S:in addition to acute issue of upper breast or infection, patient also mentions he continues to have issues with neuropathy. Prior lab workup was unrevealing. He continues have numbness and tingling in both feet and sometimes up into his ankle. He feels like this issue is becoming slightly worse. He seems to be having more issues with his balance though he has had no falls A/P: We will refer to neurology for potential nerve conduction studies as patient would like more information on etiology symptoms

## 2015-07-08 NOTE — Assessment & Plan Note (Signed)
S: Patient is noted a lesion in his left inner thigh that he had not noted before but is not sure how long it has been there. Has noted for at least 6 months but is not growing in size and that time. He wondered if it was a lymph node. Thinks he had similar area on his arm which is now gone. A/P: Exam reveals a likely lipoma. We discussed this could be surgically removed and particularly would consider this if became attached underlying tissue OR IF was increasing in size.

## 2015-07-08 NOTE — Progress Notes (Signed)
PCP: Garret Reddish, MD  Subjective:  [name] is a 82 y.o. year old very pleasant male patient who presents with Upper Respiratory infection symptoms including Sore throat started on Sunday morning, felt better during the day yesterday- took some tylenol. Last night came back more aggressively and did not sleep very well. Sore throat resolved this morning and then started coughing. Feels tired and run down. Main reason today was to make sure wife doesn't get sick as elderly with multiple medical issues (although patient also has). Symptoms show no change.  -sick contacts/travel/risks: denies flu exposure.   ROS-denies fever, SOB, NVD, tooth pain or dental pain  Pertinent Past Medical History-  Patient Active Problem List   Diagnosis Date Noted  . Chronic systolic CHF (congestive heart failure) ([place]) 03/21/2013    Priority: High  . Nonischemic cardiomyopathy ([place]) 09/20/2010    Priority: High  . PPM-St.Jude 11/27/2008    Priority: High  . CAD (coronary artery disease) 07/20/2008    Priority: High  . SICK SINUS SYNDROME 07/20/2008    Priority: High  . Atrial fibrillation ([place]) 03/16/2007    Priority: High  . Neuropathy ([place]) 05/16/2015    Priority: Medium  . Chronic cough 08/18/2013    Priority: Medium  . Dyspnea 08/16/2013    Priority: Medium  . Pleural effusion, right 08/16/2013    Priority: Medium  . Pleural effusion on right 07/06/2012    Priority: Medium  . Hyperlipidemia 03/16/2007    Priority: Medium  . BPH (benign prostatic hyperplasia) 03/16/2007    Priority: Medium  . Allergic rhinitis 02/14/2014    Priority: Low  . Fecal incontinence 10/18/2013    Priority: Low  . Recurrent right inguinal hernia 11/22/2012    Priority: Low  . Intention tremor 11/01/2012    Priority: Low  . Polymyalgia rheumatica ([place]) 07/03/2008    Priority: Low  . MIXED HEARING LOSS BILATERAL 01/10/2008    Priority: Low  . GERD 03/16/2007    Priority: Low  . Trigger thumb of  right hand 07/04/2014  . Encounter for therapeutic drug monitoring 07/02/2014  . Fecal soiling due to fecal incontinence 04/20/2014    Medications- reviewed  Current Outpatient Prescriptions  Medication Sig Dispense Refill  . amiodarone (PACERONE) 200 MG tablet Take 1 tablet (200 mg total) by mouth daily. 90 tablet 3  . carvedilol (COREG) 12.5 MG tablet Take 1/2 tablet in the morning and 1 whole tablet in the pm 135 tablet 3  . furosemide (LASIX) 20 MG tablet Take 2 tablets (40 mg total) by mouth daily. 180 tablet 3  . losartan (COZAAR) 25 MG tablet 1/2 tablet (12.5mg  ) by mouth  daily 135 tablet 3  . potassium chloride SA (K-DUR,KLOR-CON) 20 MEQ tablet Take 1 tablet (20 mEq total) by mouth daily. 90 tablet 3  . pravastatin (PRAVACHOL) 80 MG tablet Take 0.5 tablets (40 mg total) by mouth daily. 45 tablet 3  . spironolactone (ALDACTONE) 25 MG tablet Take 0.5 tablets (12.5 mg total) by mouth every evening. 45 tablet 3  . tamsulosin (FLOMAX) 0.4 MG CAPS capsule Take 1 capsule (0.4 mg total) by mouth daily. 90 capsule 3  . warfarin (COUMADIN) 5 MG tablet TAKE 1/2 TO 1 TABLET (2.5 TO 5 MG TOTAL) DAILY AS DIRECTED BY ANTICOAGULATION CLINIC 90 tablet 3   No current facility-administered medications for this visit.    Objective: BP 92/50 mmHg  Pulse 89  Temp(Src) 98.7 F (37.1 C)  Wt 161 lb (73.029 kg)  SpO2  96% Gen: NAD, resting comfortably HEENT: Turbinates erythematous with clear drainage, TM normal, pharynx mildly erythematous with no tonsilar exudate or edema, no sinus tenderness CV: Irregularly irregular no murmurs rubs or gallops Lungs: CTAB no crackles, wheeze, rhonchi Ext: no edema On left inner thigh has a 3 -4 x 3 -4 cm freely mobile subcutaneous lesion which has a soft spongy texture Skin: warm, dry, no rash Neuro: grossly normal, moves all extremities  Assessment/Plan:  Upper Respiratory infection History and exam today are suggestive of viral URI.  We discussed that a  serious infection or illness is unlikely. We also discussed reasons why current illness does not meet criteria for bacterial illness and therefore no antibiotics indicated. Also educated on signs that bacterial infection may have developed. We discussed treatment side effects, likely course, antibiotic misuse, transmission, and signs of developing a serious illness.  Symptomatic treatment with: Mucinex-Dm is reasonable  Finally, we reviewed reasons to return to care including if symptoms worsen or persist or new concerns arise especially fever or shortness of breath  Neuropathy ([facility]) S:in addition to acute issue of upper breast or infection, patient also mentions he continues to have issues with neuropathy. Prior lab workup was unrevealing. He continues have numbness and tingling in both feet and sometimes up into his ankle. He feels like this issue is becoming slightly worse. He seems to be having more issues with his balance though he has had no falls A/P: We will refer to neurology for potential nerve conduction studies as patient would like more information on etiology symptoms   Lipoma S: Patient is noted a lesion in his left inner thigh that he had not noted before but is not sure how long it has been there. Has noted for at least 6 months but is not growing in size and that time. He wondered if it was a lymph node. Thinks he had similar area on his arm which is now gone. A/P: Exam reveals a likely lipoma. We discussed this could be surgically removed and particularly would consider this if became attached underlying tissue OR IF was increasing in size.    The duration of face-to-face time during this visit was 25 minutes. Greater than 50% of this time was spent in counseling, explanation of diagnosis, planning of further management, and/or coordination of care.

## 2015-07-15 ENCOUNTER — Encounter: Payer: Self-pay | Admitting: Family Medicine

## 2015-07-15 ENCOUNTER — Ambulatory Visit (INDEPENDENT_AMBULATORY_CARE_PROVIDER_SITE_OTHER): Payer: Medicare Other | Admitting: Family Medicine

## 2015-07-15 VITALS — BP 102/62 | HR 94 | Temp 98.0°F | Wt 162.0 lb

## 2015-07-15 DIAGNOSIS — N63 Unspecified lump in breast: Secondary | ICD-10-CM | POA: Diagnosis not present

## 2015-07-15 DIAGNOSIS — N6320 Unspecified lump in the left breast, unspecified quadrant: Secondary | ICD-10-CM

## 2015-07-15 MED ORDER — ALBUTEROL SULFATE HFA 108 (90 BASE) MCG/ACT IN AERS: 2.0000 | INHALATION_SPRAY | Freq: Four times a day (QID) | RESPIRATORY_TRACT | Status: DC | PRN

## 2015-07-15 MED ORDER — AMOXICILLIN-POT CLAVULANATE 875-125 MG PO TABS: 1.0000 | ORAL_TABLET | Freq: Two times a day (BID) | ORAL | Status: DC

## 2015-07-15 NOTE — Patient Instructions (Signed)
We will call you within a week about your referral for mammogram. If you do not hear within 2 weeks, give [place] a call.   Your cough has progressed to bronchitis -start augmentin to cover for bacterial element given you and your wife's other illnesses -use inhaler as needed -likely to last 4-6 weeks even with this treatment -see [place] back if lasts past then or have fever or worsening symptoms

## 2015-07-15 NOTE — Progress Notes (Signed)
PCP: Garret Reddish, MD  Subjective:  [name] is a 82 y.o. year old very pleasant male patient who presents with Upper Respiratory infection vs. bronchits symptoms  Patient seen last week for "Sore throat started on Sunday morning, felt better during the day yesterday- took some tylenol. Last night came back more aggressively and did not sleep very well. Sore throat resolved this morning and then started coughing. Feels tired and run down. Main reason today was to make sure wife doesn't get sick as elderly with multiple medical issues (although patient also has). Symptoms show no change."  Patient states since that time when I diagnosed him with URI and advised symptomatic care that hsi symptoms have gradually worsened. Coughing up yellow phlegm, yellow drainage from nares, feels more tired and run down. He has started to notice rattling in his chest as well as wheezing. Very mild shortness of breath. Continues to deny sick contacts  ROS-denies fever,NVD, tooth pain or dental pain  Pertinent Past Medical History-  Patient Active Problem List   Diagnosis Date Noted  . Chronic systolic CHF (congestive heart failure) ([place]) 03/21/2013    Priority: High  . Nonischemic cardiomyopathy ([place]) 09/20/2010    Priority: High  . PPM-St.Jude 11/27/2008    Priority: High  . CAD (coronary artery disease) 07/20/2008    Priority: High  . SICK SINUS SYNDROME 07/20/2008    Priority: High  . Atrial fibrillation ([place]) 03/16/2007    Priority: High  . Neuropathy ([place]) 05/16/2015    Priority: Medium  . Chronic cough 08/18/2013    Priority: Medium  . Dyspnea 08/16/2013    Priority: Medium  . Pleural effusion, right 08/16/2013    Priority: Medium  . Pleural effusion on right 07/06/2012    Priority: Medium  . Hyperlipidemia 03/16/2007    Priority: Medium  . BPH (benign prostatic hyperplasia) 03/16/2007    Priority: Medium  . Allergic rhinitis 02/14/2014    Priority: Low  . Fecal incontinence  10/18/2013    Priority: Low  . Recurrent right inguinal hernia 11/22/2012    Priority: Low  . Intention tremor 11/01/2012    Priority: Low  . Polymyalgia rheumatica ([place]) 07/03/2008    Priority: Low  . MIXED HEARING LOSS BILATERAL 01/10/2008    Priority: Low  . GERD 03/16/2007    Priority: Low  . Left breast lump 07/15/2015  . Lipoma 07/08/2015  . Trigger thumb of right hand 07/04/2014  . Encounter for therapeutic drug monitoring 07/02/2014  . Fecal soiling due to fecal incontinence 04/20/2014    Medications- reviewed  Current Outpatient Prescriptions  Medication Sig Dispense Refill  . amiodarone (PACERONE) 200 MG tablet Take 1 tablet (200 mg total) by mouth daily. 90 tablet 3  . carvedilol (COREG) 12.5 MG tablet Take 1/2 tablet in the morning and 1 whole tablet in the pm 135 tablet 3  . furosemide (LASIX) 20 MG tablet Take 2 tablets (40 mg total) by mouth daily. 180 tablet 3  . losartan (COZAAR) 25 MG tablet 1/2 tablet (12.5mg  ) by mouth  daily 135 tablet 3  . potassium chloride SA (K-DUR,KLOR-CON) 20 MEQ tablet Take 1 tablet (20 mEq total) by mouth daily. 90 tablet 3  . pravastatin (PRAVACHOL) 80 MG tablet Take 0.5 tablets (40 mg total) by mouth daily. 45 tablet 3  . spironolactone (ALDACTONE) 25 MG tablet Take 0.5 tablets (12.5 mg total) by mouth every evening. 45 tablet 3  . tamsulosin (FLOMAX) 0.4 MG CAPS capsule Take 1 capsule (0.4  mg total) by mouth daily. 90 capsule 3  . warfarin (COUMADIN) 5 MG tablet TAKE 1/2 TO 1 TABLET (2.5 TO 5 MG TOTAL) DAILY AS DIRECTED BY ANTICOAGULATION CLINIC 90 tablet 3  . albuterol (PROVENTIL HFA;VENTOLIN HFA) 108 (90 Base) MCG/ACT inhaler Inhale 2 puffs into the lungs every 6 (six) hours as needed for wheezing or shortness of breath. 1 Inhaler 0  . amoxicillin-clavulanate (AUGMENTIN) 875-125 MG tablet Take 1 tablet by mouth 2 (two) times daily. 14 tablet 0   No current facility-administered medications for this visit.    Objective: BP 102/62  mmHg  Pulse 94  Temp(Src) 98 F (36.7 C)  Wt 162 lb (73.483 kg)  SpO2 95% Gen: NAD, resting comfortably HEENT: Turbinates erythematous with yellow drainage, TM normal, pharynx mildly erythematous with no tonsilar exudate or edema, no sinus tenderness CV: Irregularly irregular no murmurs rubs or gallops Lungs: CTAB no crackles, wheeze, rhonchi Ext: no edema Skin: warm, dry, no rash Neuro: grossly normal, moves all extremities  Assessment/Plan:  Bronchitis Appears URI has progressed to bronchitis. With comorbidities (and wife's) will cover with augmentin- preferred z pack but concern about his arhythmia history. Albuterol for symptomatic treatment. Continued Symptomatic treatment with: Mucinex-Dm is reasonable  Finally, we reviewed reasons to return to care including if symptoms worsen or persist (past 4-6 weeks) or new concerns arise especially fever or shortness of breath  Left breast lump S: 2 weeks, swelling under nipple and tenderness. Had similar occurrence on right chest years ago. Hurts to palpate. Mild aching without palpation. A/P:will get diagnostic mammogram on left to further evaluate   Orders Placed This Encounter  Procedures  . MM Digital Diagnostic Unilat L    Standing Status: Future     Number of Occurrences:      Standing Expiration Date: 09/13/2016    Order Specific Question:  Reason for Exam (SYMPTOM  OR DIAGNOSIS REQUIRED)    Answer:  left breast lump and tender    Order Specific Question:  Preferred imaging location?    Answer:  [facility]   Meds ordered this encounter  Medications  . DISCONTD: amoxicillin-clavulanate (AUGMENTIN) 875-125 MG tablet    Sig: Take 1 tablet by mouth 2 (two) times daily.    Dispense:  14 tablet    Refill:  0  . DISCONTD: albuterol (PROVENTIL HFA;VENTOLIN HFA) 108 (90 Base) MCG/ACT inhaler    Sig: Inhale 2 puffs into the lungs every 6 (six) hours as needed for wheezing or shortness of breath.    Dispense:  1 Inhaler     Refill:  0  . amoxicillin-clavulanate (AUGMENTIN) 875-125 MG tablet    Sig: Take 1 tablet by mouth 2 (two) times daily.    Dispense:  14 tablet    Refill:  0  . albuterol (PROVENTIL HFA;VENTOLIN HFA) 108 (90 Base) MCG/ACT inhaler    Sig: Inhale 2 puffs into the lungs every 6 (six) hours as needed for wheezing or shortness of breath.    Dispense:  1 Inhaler    Refill:  0  new acute issue left breast lump with uncertain prognosis  Garret Reddish, MD

## 2015-07-15 NOTE — Assessment & Plan Note (Signed)
S: 2 weeks, swelling under nipple and tenderness. Had similar occurrence on right chest years ago. Hurts to palpate. Mild aching without palpation. A/P:will get diagnostic mammogram on left to further evaluate

## 2015-07-19 ENCOUNTER — Ambulatory Visit (INDEPENDENT_AMBULATORY_CARE_PROVIDER_SITE_OTHER): Payer: Medicare Other | Admitting: *Deleted

## 2015-07-19 DIAGNOSIS — I480 Paroxysmal atrial fibrillation: Secondary | ICD-10-CM | POA: Diagnosis not present

## 2015-07-19 DIAGNOSIS — I4891 Unspecified atrial fibrillation: Secondary | ICD-10-CM

## 2015-07-19 DIAGNOSIS — Z5181 Encounter for therapeutic drug level monitoring: Secondary | ICD-10-CM | POA: Diagnosis not present

## 2015-07-19 LAB — POCT INR: INR: 2.1

## 2015-07-23 ENCOUNTER — Encounter: Payer: Self-pay | Admitting: Family Medicine

## 2015-07-23 ENCOUNTER — Other Ambulatory Visit: Payer: Self-pay | Admitting: Family Medicine

## 2015-07-23 DIAGNOSIS — N6320 Unspecified lump in the left breast, unspecified quadrant: Secondary | ICD-10-CM

## 2015-07-29 ENCOUNTER — Ambulatory Visit
Admission: RE | Admit: 2015-07-29 | Discharge: 2015-07-29 | Disposition: A | Discharge status: Home / Self Care | Payer: Medicare Other | Source: Ambulatory Visit | Attending: Family Medicine | Admitting: Family Medicine

## 2015-07-29 DIAGNOSIS — N6320 Unspecified lump in the left breast, unspecified quadrant: Secondary | ICD-10-CM

## 2015-07-29 DIAGNOSIS — N62 Hypertrophy of breast: Secondary | ICD-10-CM | POA: Diagnosis not present

## 2015-08-07 ENCOUNTER — Ambulatory Visit (INDEPENDENT_AMBULATORY_CARE_PROVIDER_SITE_OTHER): Payer: Medicare Other | Admitting: *Deleted

## 2015-08-07 DIAGNOSIS — I495 Sick sinus syndrome: Secondary | ICD-10-CM | POA: Diagnosis not present

## 2015-08-07 NOTE — Progress Notes (Signed)
Remote pacemaker transmission.   

## 2015-08-08 ENCOUNTER — Ambulatory Visit: Payer: Medicare Other | Admitting: Neurology

## 2015-08-21 ENCOUNTER — Encounter: Payer: Self-pay | Admitting: Neurology

## 2015-08-21 ENCOUNTER — Ambulatory Visit (INDEPENDENT_AMBULATORY_CARE_PROVIDER_SITE_OTHER): Payer: Medicare Other | Admitting: Neurology

## 2015-08-21 VITALS — BP 100/60 | HR 88 | Ht 66.0 in | Wt 156.4 lb

## 2015-08-21 DIAGNOSIS — R278 Other lack of coordination: Secondary | ICD-10-CM | POA: Diagnosis not present

## 2015-08-21 DIAGNOSIS — G609 Hereditary and idiopathic neuropathy, unspecified: Secondary | ICD-10-CM

## 2015-08-21 NOTE — Patient Instructions (Addendum)
Encourage to use a cane for stability Start balance exercises Amiodarone neuropathy is possible, but given mild symptoms, do not suggest that any medication changes are made.  Return to clinic as needed  [facility]  Preventing Falls in the [place] are common, often dreaded events in the lives of older people. Aside from the obvious injuries and even death that may result, falls can cause wide-ranging consequences including loss of independence, mental decline, decreased activity, and mobility. Younger people are also at risk of falling, especially those with chronic illnesses and fatigue.  Ways to reduce the risk for falling:  * Examine diet and medications. Warm foods and alcohol dilate blood vessels, which can lead to dizziness when standing. Sleep aids, antidepressants, and pain medications can also increase the likelihood of a fall.  * Get a vison exam. Poor vision, cataracts, and glaucoma increase the chances of falling.  * Check foot gear. Shoes should fit snugly and have a sturdy, nonskid sole and broad, low heel.  * Participate in a physician-approved exercise program to build and maintain muscle strength and improve balance and coordination.  * Increase vitamin D intake. Vitamin D improves muscle strength and increases the amount of calcium the body is able to absorb and deposit in bones.  How to prevent falls from common hazards:  * Floors - Remove all loose wires, cords, and throw rugs. Minimize clutter. Make sure rugs are anchored and smooth. Keep furniture in its usual place.  * Chairs - Use chairs with straight backs, armrests, and firm seats. Add firm cushions to existing pieces to add height.  * Bathroom - Install grab bars and non-skid tape in the tub or shower. Use a bathtub transfer bench or a shower chair with a back support. Use an elevated toilet seat and/or safety rails to assist standing from a low surface. Do not use towel racks or bathroom tissue holders to  help you stand.  * Lighting - Make sure halls, stairways, and entrances are well-lit. Install a night light in your bathroom or hallway. Make sure there is a light switch at the top and bottom of the staircase. Turn lights on if you get up in the middle of the night. Make sure lamps or light switches are within reach of the bed if you have to get up during the night.  * Kitchen - Install non-skid rubber mats near the sink and stove. Clean spills immediately. Store frequently used utensils, pots, and pans between waist and eye level. This helps prevent reaching and bending. Sit when getting things out of the lower cupboards.  * Living room / [place] furniture with wide spaces in between, giving enough room to move around. Establish a route through the living room that gives you something to hold onto as you walk.  * Stairs - Make sure treads, rails, and rugs are secure. Install a rail on both sides of the stairs. If stairs are a threat, it might be helpful to arrange most of your activities on the lower level to reduce the number of times you must climb the stairs.  * Entrances and doorways - Install metal handles on the walls adjacent to the doorknobs of all doors to make it more secure as you travel through the doorway.  Tips for maintaining balance:  * Keep at least one hand free at all times Try using a backpack or fanny pack to hold things rather than carrying them in your hands. Never carry  objects in both hands when walking as this interferes with keeping your balance.  * Attempt to swing both arms from front to back while walking. This might require a conscious effort if Parkinson's disease has diminished your movement. It will, however, help you to maintain balance and posture, and reduce fatigue.  * Consciously lift your feet off the ground when walking. Shuffling and dragging of the feet is a common culprit in losing your balance.  * When trying to navigate turns, use a "U" technique of  facing forward and making a wide turn, rather than pivoting sharply.  * Try to stand with your feet shoulder-length apart. When your feet are close together for any length of time, you increase your risk of losing your balance and falling.  * Do one thing at a time. Do not try to walk and accomplish another task, such as reading or looking around. The decrease in your automatic reflexes complicates motor function, so the less distraction, the better.  * Do not wear rubber or gripping soled shoes, they might "catch" on the floor and cause tripping.  * Move slowly when changing positions. Use deliberate, concentrated movements and, if needed, use a grab bar or walking aid. Count fifteen (15) seconds after standing to begin walking.  * If balance is a continuous problem, you might want to consider a walking aid such as a cane, walking stick, or walker. Once you have mastered walking with help, you may be ready to try it again on your own.  This information is provided by [facility] and is not intended to replace the medical advice of your physician or other health care providers. Please consult your physician or other health care providers for advice regarding your specific medical condition.

## 2015-08-21 NOTE — Progress Notes (Signed)
[facility] Clinic Note - Initial Visit   Date: 08/21/2015  [name] MRN: [id number] DOB: [birth year]   Dear Dr. Yong Channel:  Thank you for your kind referral of [name] for consultation of neuropathy. Although his history is well known to you, please allow [place] to reiterate it for the purpose of our medical record. The patient was accompanied to the clinic by self.  History of Present Illness: [name] is a 82 y.o. right-handed Caucasian male with hyperlipidemia, hypertension, CAD, sick sinus syndrome s/p PPM, GERD, and atrial fibrillation (on coumadin) presenting for evaluation of neuropathy.    Starting around 2015, he began having numbness involving the feet which has extended into his lower legs, as if he is wearing socks.  He does not have tingling or pain.  He has noticed mild imbalance and weakness, worse on the left side.  He has not had any falls.  Symptoms are constant and there are no idenfiable triggers or alleviating factors.    He does balance exercises are [facility].  There is no history of diabetes or solvent exposure.  He previously used to drink about a beer with dinner or 1-2 glasses of wine. He quit drinking about 15 years at the recommendation of his cardiologist due to concern of cardiomyopathy.  He has been on amiodarone for at least 5 years.      Out-side paper records, electronic medical record, and images have been reviewed where available and summarized as:  Lab Results  Component Value Date   TSH 4.41 05/16/2015   Lab Results  Component Value Date   HGBA1C 6.1 05/16/2015   Lab Results  Component Value Date   [id number] 05/16/2015    Past Medical History  Diagnosis Date  . Coronary artery disease   . Cardiomyopathy primary-nonischemic EF 45%  . Hyperlipidemia   . GERD (gastroesophageal reflux disease)   . Polymyalgia rheumatica ([place])   . Sick sinus syndrome ([place])   . Atrial fibrillation  ([place])   . Dysphagia   . Hearing loss, mixed, bilateral   . Increased prostate specific antigen (PSA) velocity   . Lumbar back pain   . BPH (benign prostatic hypertrophy)   . Breast mass     "on both sides" (06/28/2012)  . CHF (congestive heart failure) ([place])   . Elevated liver enzymes   . Esophageal stricture   . Pacemaker infection ([place])     06/28/12  . Hx of cardiovascular stress test     Adenosine Myoview (07/2013):  Apical cap, apical lateral and mid anterolateral scar, small amount of peri-infarct ischemia, EF 36%; Medium Risk  . Pacemaker     st jude  . Diverticulosis of colon without hemorrhage 01/03/2014  . Internal hemorrhoids 01/03/2014  . Arthritis     "back" (03/20/2014)    Past Surgical History  Procedure Laterality Date  . Cataract extraction, bilateral Bilateral 1980's  . Umbilical hernia repair  [id number] X 3  . Insert / replace / remove pacemaker    . Cardioversion  06/17/2011    Procedure: CARDIOVERSION;  Surgeon: Lelon Perla, MD;  Location: [place];  Service: Cardiovascular;  Laterality: N/A;  . Cardioversion  09/09/2011    Procedure: CARDIOVERSION;  Surgeon: Carlena Bjornstad, MD;  Location: [place];  Service: Cardiovascular;  Laterality: N/A;  . Cardioversion  01/01/2012    Procedure: CARDIOVERSION;  Surgeon: Hillary Bow, MD;  Location: [place];  Service: Cardiovascular;  Laterality: N/A;  .  Tee without cardioversion  01/01/2012    Procedure: TRANSESOPHAGEAL ECHOCARDIOGRAM (TEE);  Surgeon: Fay Records, MD;  Location: [facility];  Service: Cardiovascular;  Laterality: N/A;  . Generator removal Left 06/15/2012    Procedure: GENERATOR REMOVAL;  Surgeon: Evans Lance, MD;  Location: [place];  Service: Cardiovascular;  Laterality: Left;  . Pacemaker generator change N/A 06/15/2012    Procedure: PACEMAKER GENERATOR CHANGE;  Surgeon: Evans Lance, MD;  Location: [place];  Service: Cardiovascular;  Laterality: N/A;  . Tonsillectomy and adenoidectomy  1938  .  Esophagogastroduodenoscopy    . Colonoscopy N/A 01/03/2014    Procedure: COLONOSCOPY;  Surgeon: Gatha Mayer, MD;  Location: [facility];  Service: Endoscopy;  Laterality: N/A;  . Permanent pacemaker insertion N/A 06/28/2012    Procedure: PERMANENT PACEMAKER INSERTION;  Surgeon: Evans Lance, MD;  Location: [facility] CATH LAB;  Service: Cardiovascular;  Laterality: N/A;  . Lead revision N/A 06/29/2012    Procedure: LEAD REVISION;  Surgeon: Deboraha Sprang, MD;  Location: [facility] CATH LAB;  Service: Cardiovascular;  Laterality: N/A;  . Pacemaker revision  06/30/2012    Procedure: PACEMAKER LEAD REVISION;  Surgeon: Evans Lance, MD;  Location: [facility] CATH LAB;  Service: Cardiovascular;;  . Cardiac catheterization  1980's    [place]  . Paracentesis  ~ 0000000    complication from pacer change in 2014  . Inguinal hernia repair Right 2012; 03/20/2014  . Breast surgery    . Inguinal hernia repair Right 03/20/2014    Procedure: OPEN REPAIR OF RIGHT INGUINAL WITH MESH;  Surgeon: Donnie Mesa, MD;  Location: [place];  Service: General;  Laterality: Right;  . Insertion of mesh Right 03/20/2014    Procedure: INSERTION OF MESH;  Surgeon: Donnie Mesa, MD;  Location: [place];  Service: General;  Laterality: Right;  . Tee without cardioversion N/A 08/03/2014    Procedure: TRANSESOPHAGEAL ECHOCARDIOGRAM (TEE);  Surgeon: Larey Dresser, MD;  Location: [place];  Service: Cardiovascular;  Laterality: N/A;  . Cardioversion N/A 08/03/2014    Procedure: CARDIOVERSION;  Surgeon: Larey Dresser, MD;  Location: [facility] ENDOSCOPY;  Service: Cardiovascular;  Laterality: N/A;     Medications:  Outpatient Encounter Prescriptions as of 08/21/2015  Medication Sig  . albuterol (PROVENTIL HFA;VENTOLIN HFA) 108 (90 Base) MCG/ACT inhaler Inhale 2 puffs into the lungs every 6 (six) hours as needed for wheezing or shortness of breath.  Marland Kitchen amiodarone (PACERONE) 200 MG tablet Take 1 tablet (200 mg total) by mouth daily.  . carvedilol (COREG)  12.5 MG tablet Take 1/2 tablet in the morning and 1 whole tablet in the pm  . furosemide (LASIX) 20 MG tablet Take 2 tablets (40 mg total) by mouth daily.  Marland Kitchen losartan (COZAAR) 25 MG tablet 1/2 tablet (12.5mg  ) by mouth  daily  . potassium chloride SA (K-DUR,KLOR-CON) 20 MEQ tablet Take 1 tablet (20 mEq total) by mouth daily.  . pravastatin (PRAVACHOL) 80 MG tablet Take 0.5 tablets (40 mg total) by mouth daily.  Marland Kitchen spironolactone (ALDACTONE) 25 MG tablet Take 0.5 tablets (12.5 mg total) by mouth every evening.  . tamsulosin (FLOMAX) 0.4 MG CAPS capsule Take 1 capsule (0.4 mg total) by mouth daily.  Marland Kitchen warfarin (COUMADIN) 5 MG tablet TAKE 1/2 TO 1 TABLET (2.5 TO 5 MG TOTAL) DAILY AS DIRECTED BY ANTICOAGULATION CLINIC  . [DISCONTINUED] amoxicillin-clavulanate (AUGMENTIN) 875-125 MG tablet Take 1 tablet by mouth 2 (two) times daily.   No facility-administered encounter medications on file as of 08/21/2015.  Allergies:  Allergies  Allergen Reactions  . Antihistamines, Diphenhydramine-Type Other (See Comments)    Causes difficulty in ability to urinate.    Family History: Family History  Problem Relation Age of Onset  . Heart attack Father 32    deceased  . AAA (abdominal aortic aneurysm) Mother     deceased AAA  . Hypertension Mother   . Other Brother     deceased at birth  . Healthy Son   . Healthy Daughter   . Diabetes Cousin     Social History: Social History  Substance Use Topics  . Smoking status: Former Smoker -- 1.00 packs/day for 40 years    Types: Cigarettes    Quit date: 03/10/1983  . Smokeless tobacco: Never Used  . Alcohol Use: 0.0 oz/week    0 Standard drinks or equivalent per week     Comment: quit drinking 2000-? alcohol related cardiomyopathy   Social History   Social History Narrative   Married. 3 children (6 together). 5 grandkids with with 5 grandkids (2nd marriage). No greatgrandkids.    One story home.   Retired from [employer]   Education: college   Hobbies: play golf, bridge, garden, write family stories    Review of Systems:  CONSTITUTIONAL: No fevers, chills, night sweats, or weight loss.   EYES: No visual changes or eye pain ENT: No hearing changes.  No history of nose bleeds.   RESPIRATORY: No cough, wheezing and shortness of breath.   CARDIOVASCULAR: Negative for chest pain, and palpitations.   GI: Negative for abdominal discomfort, blood in stools or black stools.  No recent change in bowel habits.   GU:  No history of incontinence.   MUSCLOSKELETAL: No history of joint pain or swelling.  No myalgias.   SKIN: Negative for lesions, rash, and itching.   HEMATOLOGY/ONCOLOGY: Negative for prolonged bleeding, bruising easily, and swollen nodes.  No history of cancer.   ENDOCRINE: Negative for cold or heat intolerance, polydipsia or goiter.   PSYCH:  No depression or anxiety symptoms.   NEURO: As Above.   Vital Signs:  BP 100/60 mmHg  Pulse 88  Ht 5\' 6"  (1.676 m)  Wt 156 lb 6 oz (70.931 kg)  BMI 25.25 kg/m2  SpO2 96% Pain Scale: 0 on a scale of 0-10   General Medical Exam:   General:  Well appearing, comfortable.   Eyes/ENT: see cranial nerve examination.   Neck: No masses appreciated.  Full range of motion without tenderness.  No carotid bruits. Respiratory:  Clear to auscultation, good air entry bilaterally.   Cardiac:  Regular rate and rhythm, no murmur.   Extremities:  No deformities, edema, or skin discoloration.  Skin:  No rashes or lesions.  Neurological Exam: MENTAL STATUS including orientation to time, place, person, recent and remote memory, attention span and concentration, language, and fund of knowledge is normal.  Speech is not dysarthric.  CRANIAL NERVES: II:  No visual field defects.  Unremarkable fundi.   III-IV-VI: Pupils equal round and reactive to light.  Normal conjugate, extra-ocular eye movements in all directions of gaze.  No nystagmus.  No ptosis.   V:   Normal facial sensation.    VII:  Normal facial symmetry and movements.  No pathologic facial reflexes.  VIII:  Normal hearing and vestibular function.   IX-X:  Normal palatal movement.   XI:  Normal shoulder shrug and head rotation.   XII:  Normal tongue strength and range of motion, no deviation  or fasciculation.  MOTOR:  No atrophy, fasciculations or abnormal movements.  No pronator drift.  Tone is normal.    Right Upper Extremity:    Left Upper Extremity:    Deltoid  5/5   Deltoid  5/5   Biceps  5/5   Biceps  5/5   Triceps  5/5   Triceps  5/5   Wrist extensors  5/5   Wrist extensors  5/5   Wrist flexors  5/5   Wrist flexors  5/5   Finger extensors  5/5   Finger extensors  5/5   Finger flexors  5/5   Finger flexors  5/5   Dorsal interossei  5/5   Dorsal interossei  5/5   Abductor pollicis  5/5   Abductor pollicis  5/5   Tone (Ashworth scale)  0  Tone (Ashworth scale)  0   Right Lower Extremity:    Left Lower Extremity:    Hip flexors  5/5   Hip flexors  5/5   Hip extensors  5/5   Hip extensors  5/5   Knee flexors  5/5   Knee flexors  5/5   Knee extensors  5/5   Knee extensors  5/5   Dorsiflexors  5/5   Dorsiflexors  5/5   Plantarflexors  5/5   Plantarflexors  5/5   Toe extensors  5-/5   Toe extensors  4/5   Toe flexors  3/5   Toe flexors  4/5   Tone (Ashworth scale)  0  Tone (Ashworth scale)  0   MSRs:  Right                                                                 Left brachioradialis 2+  brachioradialis 2+  biceps 2+  biceps 2+  triceps 2+  triceps 2+  patellar 2+  patellar 2+  ankle jerk 0  ankle jerk 0  Hoffman no  Hoffman no  plantar response down  plantar response down   SENSORY:  Temperature, light touch, and pin prick is reduced following a gradient pattern distal to mid-calf bilaterally.  Vibration is absent distal to ankles.  Proprioception impaired at the great toe.  There is mild sway with Rhomberg testing.  COORDINATION/GAIT: Normal finger-to-  nose-finger and heel-to-shin.  Intact rapid alternating movements bilaterally.  Able to rise from a chair without using arms.  Gait is unsteady at times, especially with turning.  He can perform stressed gait.  He is quite unsteady with tandem gait.   IMPRESSION: Mr. [place] is a 82 year-old gentleman referred for evaluation of neuropathy, which is consistent with his clinical exam.  I had extensive discussion with the patient regarding the pathogenesis, etiology, management, and natural course of neuropathy. Neuropathy tends to be slowly progressive, especially if a treatable etiology is not identified.  He does not have diabetes, thyroid problems, or vitamin B12 deficiency. Amiodarone neuropathy is possible, but given mild symptoms, do not suggest that any medication changes are made. I do not think that his use of alcohol is enough to cause such late onset of neuropathy.   Further electrodiagnostic testing and labs (copper, SPEP) was declined by patient.  He is not bothered by his symptoms, but had many questions as to what to expect with  respect to his balance.  Fortunately, he has not had any falls but with neuropathy, he remains at risk.  We further discussed fall precautions, especially since he is on anticoagulation.  To minimize the potential risk of a devastating bleed, I do recommend that he start using a cane and start balance training exercises which is available where he lives.  If balance worsens, recommend formal balance training with physical therapy.  Return to clinic as needed  The duration of this appointment visit was 45 minutes of face-to-face time with the patient.  Greater than 50% of this time was spent in counseling, explanation of diagnosis, planning of further management, and coordination of care.   Thank you for allowing me to participate in patient's care.  If I can answer any additional questions, I would be pleased to do so.    Sincerely,    Donika K. Posey Pronto,  DO

## 2015-08-22 LAB — CUP PACEART REMOTE DEVICE CHECK
Battery Remaining Longevity: 84 mo
Battery Remaining Percentage: 95.5 %
Brady Statistic AP VP Percent: 93 %
Brady Statistic AP VS Percent: 5.8 %
Brady Statistic AS VS Percent: 1 %
Brady Statistic RA Percent Paced: 98 %
Date Time Interrogation Session: 20170531135642
Implantable Lead Implant Date: 20140422
Implantable Lead Implant Date: 20140424
Implantable Lead Location: 753858
Implantable Lead Location: 753859
Implantable Lead Model: 5076
Lead Channel Impedance Value: 790 Ohm
Lead Channel Pacing Threshold Amplitude: 0.75 V
Lead Channel Pacing Threshold Amplitude: 1 V
Lead Channel Pacing Threshold Pulse Width: 0.5 ms
Lead Channel Sensing Intrinsic Amplitude: 12 mV
Lead Channel Setting Pacing Amplitude: 2 V
Lead Channel Setting Pacing Pulse Width: 0.5 ms
Lead Channel Setting Sensing Sensitivity: 2.5 mV
MDC IDC LEAD IMPLANT DT: 20140424
MDC IDC LEAD LOCATION: 753860
MDC IDC MSMT BATTERY VOLTAGE: 2.96 V
MDC IDC MSMT LEADCHNL LV PACING THRESHOLD PULSEWIDTH: 0.5 ms
MDC IDC MSMT LEADCHNL RA IMPEDANCE VALUE: 480 Ohm
MDC IDC MSMT LEADCHNL RA PACING THRESHOLD AMPLITUDE: 0.75 V
MDC IDC MSMT LEADCHNL RA SENSING INTR AMPL: 0.6 mV
MDC IDC MSMT LEADCHNL RV IMPEDANCE VALUE: 430 Ohm
MDC IDC MSMT LEADCHNL RV PACING THRESHOLD PULSEWIDTH: 0.4 ms
MDC IDC SET LEADCHNL RA PACING AMPLITUDE: 2 V
MDC IDC SET LEADCHNL RV PACING AMPLITUDE: 2.5 V
MDC IDC SET LEADCHNL RV PACING PULSEWIDTH: 0.4 ms
MDC IDC STAT BRADY AS VP PERCENT: 1 %
Pulse Gen Model: 3210
Pulse Gen Serial Number: 2926516

## 2015-08-28 ENCOUNTER — Encounter: Payer: Self-pay | Admitting: Cardiology

## 2015-09-02 ENCOUNTER — Ambulatory Visit (INDEPENDENT_AMBULATORY_CARE_PROVIDER_SITE_OTHER): Payer: Medicare Other | Admitting: *Deleted

## 2015-09-02 DIAGNOSIS — I480 Paroxysmal atrial fibrillation: Secondary | ICD-10-CM | POA: Diagnosis not present

## 2015-09-02 DIAGNOSIS — I4891 Unspecified atrial fibrillation: Secondary | ICD-10-CM

## 2015-09-02 DIAGNOSIS — Z5181 Encounter for therapeutic drug level monitoring: Secondary | ICD-10-CM | POA: Diagnosis not present

## 2015-09-02 LAB — POCT INR: INR: 2.1

## 2015-09-12 ENCOUNTER — Encounter: Payer: Self-pay | Admitting: Cardiology

## 2015-10-14 ENCOUNTER — Ambulatory Visit (INDEPENDENT_AMBULATORY_CARE_PROVIDER_SITE_OTHER): Payer: Medicare Other | Admitting: *Deleted

## 2015-10-14 DIAGNOSIS — I4891 Unspecified atrial fibrillation: Secondary | ICD-10-CM | POA: Diagnosis not present

## 2015-10-14 DIAGNOSIS — Z5181 Encounter for therapeutic drug level monitoring: Secondary | ICD-10-CM

## 2015-10-14 LAB — POCT INR: INR: 1.6

## 2015-10-29 ENCOUNTER — Ambulatory Visit (INDEPENDENT_AMBULATORY_CARE_PROVIDER_SITE_OTHER): Payer: Medicare Other | Admitting: *Deleted

## 2015-10-29 ENCOUNTER — Ambulatory Visit: Payer: Medicare Other | Admitting: Family Medicine

## 2015-10-29 DIAGNOSIS — I4891 Unspecified atrial fibrillation: Secondary | ICD-10-CM | POA: Diagnosis not present

## 2015-10-29 DIAGNOSIS — Z5181 Encounter for therapeutic drug level monitoring: Secondary | ICD-10-CM | POA: Diagnosis not present

## 2015-10-29 LAB — POCT INR: INR: 2.3

## 2015-10-30 DIAGNOSIS — Z961 Presence of intraocular lens: Secondary | ICD-10-CM | POA: Diagnosis not present

## 2015-10-30 DIAGNOSIS — H52203 Unspecified astigmatism, bilateral: Secondary | ICD-10-CM | POA: Diagnosis not present

## 2015-11-04 ENCOUNTER — Telehealth (HOSPITAL_COMMUNITY): Payer: Self-pay | Admitting: *Deleted

## 2015-11-04 NOTE — Telephone Encounter (Signed)
Pt called to report he believes he is back in a-fib, he has felt run down and fatigue for past few days.  appt sch for tomorrow

## 2015-11-05 ENCOUNTER — Ambulatory Visit (HOSPITAL_COMMUNITY)
Admission: RE | Admit: 2015-11-05 | Discharge: 2015-11-05 | Disposition: A | Discharge status: Home / Self Care | Payer: Medicare Other | Source: Ambulatory Visit | Attending: Cardiology | Admitting: Cardiology

## 2015-11-05 ENCOUNTER — Encounter (HOSPITAL_COMMUNITY): Payer: Self-pay | Admitting: *Deleted

## 2015-11-05 ENCOUNTER — Encounter (HOSPITAL_COMMUNITY): Payer: Self-pay

## 2015-11-05 ENCOUNTER — Telehealth (HOSPITAL_COMMUNITY): Payer: Self-pay | Admitting: *Deleted

## 2015-11-05 VITALS — BP 92/58 | HR 78 | Wt 159.0 lb

## 2015-11-05 DIAGNOSIS — M353 Polymyalgia rheumatica: Secondary | ICD-10-CM | POA: Insufficient documentation

## 2015-11-05 DIAGNOSIS — I428 Other cardiomyopathies: Secondary | ICD-10-CM | POA: Diagnosis not present

## 2015-11-05 DIAGNOSIS — Z79899 Other long term (current) drug therapy: Secondary | ICD-10-CM | POA: Insufficient documentation

## 2015-11-05 DIAGNOSIS — I495 Sick sinus syndrome: Secondary | ICD-10-CM

## 2015-11-05 DIAGNOSIS — I5022 Chronic systolic (congestive) heart failure: Secondary | ICD-10-CM | POA: Diagnosis not present

## 2015-11-05 DIAGNOSIS — I2510 Atherosclerotic heart disease of native coronary artery without angina pectoris: Secondary | ICD-10-CM | POA: Diagnosis not present

## 2015-11-05 DIAGNOSIS — N400 Enlarged prostate without lower urinary tract symptoms: Secondary | ICD-10-CM | POA: Insufficient documentation

## 2015-11-05 DIAGNOSIS — Z87891 Personal history of nicotine dependence: Secondary | ICD-10-CM | POA: Diagnosis not present

## 2015-11-05 DIAGNOSIS — I442 Atrioventricular block, complete: Secondary | ICD-10-CM | POA: Diagnosis not present

## 2015-11-05 DIAGNOSIS — Z7901 Long term (current) use of anticoagulants: Secondary | ICD-10-CM | POA: Insufficient documentation

## 2015-11-05 DIAGNOSIS — Z950 Presence of cardiac pacemaker: Secondary | ICD-10-CM | POA: Diagnosis not present

## 2015-11-05 DIAGNOSIS — K219 Gastro-esophageal reflux disease without esophagitis: Secondary | ICD-10-CM | POA: Diagnosis not present

## 2015-11-05 DIAGNOSIS — E785 Hyperlipidemia, unspecified: Secondary | ICD-10-CM | POA: Diagnosis not present

## 2015-11-05 DIAGNOSIS — I480 Paroxysmal atrial fibrillation: Secondary | ICD-10-CM | POA: Insufficient documentation

## 2015-11-05 DIAGNOSIS — I4891 Unspecified atrial fibrillation: Secondary | ICD-10-CM

## 2015-11-05 LAB — COMPREHENSIVE METABOLIC PANEL
ALT: 23 U/L (ref 17–63)
AST: 27 U/L (ref 15–41)
Albumin: 3.9 g/dL (ref 3.5–5.0)
Alkaline Phosphatase: 57 U/L (ref 38–126)
Anion gap: 8 (ref 5–15)
BUN: 27 mg/dL — AB (ref 6–20)
CO2: 23 mmol/L (ref 22–32)
Calcium: 9.1 mg/dL (ref 8.9–10.3)
Chloride: 106 mmol/L (ref 101–111)
Creatinine, Ser: 1.16 mg/dL (ref 0.61–1.24)
GFR, EST NON AFRICAN AMERICAN: 56 mL/min — AB (ref >60)
GLUCOSE: 105 mg/dL — AB (ref 65–99)
POTASSIUM: 4.3 mmol/L (ref 3.5–5.1)
Sodium: 137 mmol/L (ref 135–145)
TOTAL PROTEIN: 6.6 g/dL (ref 6.5–8.1)
Total Bilirubin: 1.4 mg/dL — ABNORMAL HIGH (ref 0.3–1.2)

## 2015-11-05 LAB — BRAIN NATRIURETIC PEPTIDE: B Natriuretic Peptide: 605.9 pg/mL — ABNORMAL HIGH (ref 0.0–100.0)

## 2015-11-05 LAB — CBC
HEMATOCRIT: 42.4 % (ref 39.0–52.0)
Hemoglobin: 13.9 g/dL (ref 13.0–17.0)
MCH: 29.7 pg (ref 26.0–34.0)
MCHC: 32.8 g/dL (ref 30.0–36.0)
MCV: 90.6 fL (ref 78.0–100.0)
PLATELETS: 173 10*3/uL (ref 150–400)
RBC: 4.68 MIL/uL (ref 4.22–5.81)
RDW: 15.2 % (ref 11.5–15.5)
WBC: 5.5 10*3/uL (ref 4.0–10.5)

## 2015-11-05 LAB — PROTIME-INR
INR: 2.57
PROTHROMBIN TIME: 28.1 s — AB (ref 11.4–15.2)

## 2015-11-05 LAB — LIPID PANEL
CHOL/HDL RATIO: 3.8 ratio
Cholesterol: 128 mg/dL (ref 0–200)
HDL: 34 mg/dL — AB (ref >40)
LDL CALC: 81 mg/dL (ref 0–99)
TRIGLYCERIDES: 65 mg/dL (ref <150)
VLDL: 13 mg/dL (ref 0–40)

## 2015-11-05 LAB — TSH: TSH: 7.622 u[IU]/mL — ABNORMAL HIGH (ref 0.350–4.500)

## 2015-11-05 MED ORDER — AMIODARONE HCL 200 MG PO TABS: 200.0000 mg | ORAL_TABLET | Freq: Two times a day (BID) | ORAL | 3 refills | Status: DC

## 2015-11-05 NOTE — Patient Instructions (Addendum)
Increase Amiodarone to 200 mg Twice daily UNTIL Friday, then back to daily  Increase Furosemide to 40 mg Twice daily FOR 2 DAYS ONLY, THEN back to daily  Increase Potassium to 20 meq Twice daily FOR 2 DAYS ONLY, THEN back to daily  Labs today  Your physician has requested that you have a TEE/Cardioversion. During a TEE, sound waves are used to create images of your heart. It provides your doctor with information about the size and shape of your heart and how well your heart's chambers and valves are working. In this test, a transducer is attached to the end of a flexible tube that is guided down you throat and into your esophagus (the tube leading from your mouth to your stomach) to get a more detailed image of your heart. Once the TEE has determined that a blood clot is not present, the cardioversion begins. Electrical Cardioversion uses a jolt of electricity to your heart either through paddles or wired patches attached to your chest. This is a controlled, usually prescheduled, procedure. This procedure is done at the hospital and you are not awake during the procedure. You usually go home the day of the procedure. Please see the instruction sheet given to you today for more information.  THUR 8/31  INR at [facility] on Thur 8/31 at 11:30  AM  Your physician recommends that you schedule a follow-up appointment in: 4-6 weeks

## 2015-11-05 NOTE — Telephone Encounter (Signed)
Notes Recorded by Harvie Junior, CMA on 11/05/2015 at 4:46 PM EDT Patient aware. INR forwarded to coumadin clinic. Labs added to next office visit. ------  Notes Recorded by Larey Dresser, MD on 11/05/2015 at 4:34 PM EDT Send INR to coumadin clinic. TSH elevated, needs repeat TSH with free T3 and free T4 at next visit.

## 2015-11-05 NOTE — Progress Notes (Signed)
Patient ID: [name], male   DOB: 04-13-1929, 82 y.o.   MRN: [id number] PCP: Dr. Yong Channel Cardiology: Dr. Aundra Dubin  82 yo with history of paroxysmal atrial fibrillation, nonischemic cardiomyopathy, and complete heart block with St Jude CRT-P system presents for cardiology followup.  He was admitted in 4/14 with pacemaker pocket infection.  His pacemaker was removed and temporary-permanent device was placed.  Later, he had the temporary-permanent device removed and a new CRT-P device was placed. He developed dyspnea post-operatively and was found to have a large right-sided pleural effusion.  He was admitted and got a chest tube for drainage. He had followup with Dr. Servando Snare and it was decided that he would not need VATS.  He had been on amiodarone for maintenance of NSR.  This had been more successful than Tikosyn.   Around 3/15, he started developing increasing exertional dyspnea.  He was short of breath walking up a hill or incline.  He could still walk on the treadmill for exercise for 10-15 minutes and use the elliptical without much trouble on most days.  No orthopnea or PND.  No chest pain.  He saw Dr Leanne Chang and was started on Lasix three times a week due to concern for volume overload.  This did not seem to have helped much.  After that, he had PFTs done which showed normal spirometry but DLCO 50% predicted.  Therefore, amiodarone was stopped due to concern for lung toxicity.  He saw Richardson Dopp for a pre-operative evaluation prior to right inguinal hernia repair.  Given the exertional dyspnea, he was set up for adenosine Cardiolite in 5/15.  This showed EF 36% with a small area of primarily scar in the apex, apical lateral wall, and mid anterolateral wall.  He has not felt palpitations. Echo in 5/15 showed EF 40-45%, similar to prior study.   I had him see pulmonary for evaluation for amiodarone lung toxicity.  CT chest looked ok, and he was not thought to have amiodarone lung toxicity.   At a  prior appointment in 2016, Mr Mittelman was noted to be back in atrial fibrillation.  He was more short of breath with exertion and fatigued.  He has historically tolerated atrial fibrillation poorly.  I did a TEE-guided DCCV in 5/16 back to NSR.  TEE showed EF 35-40%.  He did not immediately feel better like he has in the past post-DCCV, so Lasix was increased.    He did well for over a year after 5/16, but for the last 1.5 weeks, he has felt like he is back in atrial fibrillation.  He is short of breath walking up hills and with other forms of moderate exertion.  No chest pain.  No orthopnea/PND.    [id number] fibrillation, left axis deviation, RBBB  Labs (7/13); LDL 65, HDL 38 Labs (3/14): TSH normal Labs (4/14):  ALT 61, AST 62 Labs (5/14): K 4.5, creatinine 0.9, BNP 727, digoxin 0.9 Labs (6/14): LDL 77, HDL 34, K 4, creatinine 0.9, TSH normal Labs (10/14): digoxin 2.1, K 4.7, creatinine 1.0, TSH normal, LFTs normal Labs (3/15): K 4, creatinine 1.1 Labs (4/15): BNP 482 Labs (5/15): K 3.8, creatinine 1.2 Labs (6/15): K 4.3, creatinine 0.9 Labs (7/15): TSH normal, LFTs normal Labs (9/15): TSH normal, LFTs normal Labs (1/16): K 4.3, creatinine 1.07 Labs (5/16): TSH normal, LFTs normal, K 4.4, creatinine 1.02 Labs (6/16): K 4.4, creatinine 1.29 Labs (9/16): LDL 92, LFTs normal Labs (10/16): K 4.5, creatinine 1.07 Labs (3/17): TSH normal  PMH: 1. Low back pain 2. Hyperlipidemia 3. GERD 4. BPH 5. Polymyalgia rheumatica 6. Atrial fibrillation: Failed Tikosyn in the past.  Paroxysmal, on amiodarone and warfarin.  DCCV in 4/13, 7/13, 10/13.  Has held NSR with amiodarone.  However, concern for amiodarone lung toxicity: PFTs (4/15) with FVC 90% predicted, FEV1 90%, ratio 97%, DLCO 50%.   Recurrence of atrial fibrillation in 5/16, TEE-guided DCCV back to NSR in 5/16. Recurrence of atrial fibrillation in 8/17.  7. Transaminitis: Mild, attributed to amiodarone.  8. Complete heart block s/p St  Jude CRT-P device.  Patient developed PCM pocket infection in 4/14 and had his first system removed with placement of a temporary permanent PCM.  He later had CRT-P device replaced.  This was complicated by large right-sided hemorrhagic pleural effusion requiring chest tube.   9. Nonischemic cardiomyopathy: Echo (9/13) with EF 40-45%, diffuse HK, mild MR.  Mild CAD only on prior LHC.  Echo (4/14) with EF 45-50%.  Adenosine Cardiolite (5/15) with EF 36% (visually appeared higher per report), small area of scar with peri-infarct ischemia in the apex, apical lateral wall, and mid anterolateral wall. Echo (5/15) with EF 40-45%, basal to mid inferolateral severe hypokinesis, basal to mid anterolateral hypokinesis, mildly dilated RV with mildly decreased RV systolic function, mild to moderate MR.  TEE (5/16) with EF 35-40%, inferior and inferolateral severe hypokinesis, normal RV size with mildly decreased systolic function, moderate MR, peak RV-RA gradient 26 mmHg.  10. Large right pleural effusion requiring chest tube following removal and reimplantation of PCM. Most recent chest CT in 6/15 showed significant decrease in size of loculated pleural effusion since 5/14.    SH: Married, prior smoker (quit 1985), no ETOH x years, retired, lives at [employer]  [place]: Father with MI at 67, mother with AAA.   ROS: All systems reviewed and negative except as per HPI.   Current Outpatient Prescriptions  Medication Sig Dispense Refill  . amiodarone (PACERONE) 200 MG tablet Take 1 tablet (200 mg total) by mouth 2 (two) times daily. 90 tablet 3  . carvedilol (COREG) 12.5 MG tablet Take 6.25 mg by mouth 2 (two) times daily with a meal.    . furosemide (LASIX) 20 MG tablet Take 2 tablets (40 mg total) by mouth daily. 180 tablet 3  . losartan (COZAAR) 25 MG tablet 1/2 tablet (12.5mg  ) by mouth  daily 135 tablet 3  . potassium chloride SA (K-DUR,KLOR-CON) 20 MEQ tablet Take 1 tablet (20 mEq total) by mouth daily. 90 tablet  3  . pravastatin (PRAVACHOL) 80 MG tablet Take 0.5 tablets (40 mg total) by mouth daily. 45 tablet 3  . spironolactone (ALDACTONE) 25 MG tablet Take 0.5 tablets (12.5 mg total) by mouth every evening. 45 tablet 3  . tamsulosin (FLOMAX) 0.4 MG CAPS capsule Take 1 capsule (0.4 mg total) by mouth daily. 90 capsule 3  . warfarin (COUMADIN) 5 MG tablet TAKE 1/2 TO 1 TABLET (2.5 TO 5 MG TOTAL) DAILY AS DIRECTED BY ANTICOAGULATION CLINIC 90 tablet 3   No current facility-administered medications for this encounter.     BP (!) 92/58   Pulse 78   Wt 159 lb (72.1 kg)   SpO2 98%   BMI 25.66 kg/m  General: NAD Neck: JVP 8 cm, no thyromegaly or thyroid nodule.  Lungs: CTAB  CV: Nondisplaced PMI.  Heart irregular S1/S2, no S3/S4, no murmur.  1+ edema 1/2 up lower legs bilaterally.  No carotid bruit.  Normal pedal pulses.  Abdomen: Soft,  nontender, no hepatosplenomegaly, no distention.  Neurologic: Alert and oriented x 3.  Psych: Normal affect. Extremities: No clubbing or cyanosis.   Assessment/Plan: 1. Chronic systolic CHF: NYHA class II-III symptoms with mild volume overload on exam today in setting of recurrent atrial fibrillation.  He has CRT-P.  TEE in 5/16 showed EF 35-40% with wall motion abnormalities. Presumed nonischemic cardiomyopathy (prior cath with only mild CAD).  He had an adenosine Cardiolite in 5/15 with EF 36% and possible small area of apical and lateral scar with peri-infarct ischemia.  - I suspect that he will feel better symptomatically with resumption of NSR.  - I will have him increase Lasix to 40 mg bid x 2 days then back to 40 mg daily.  I will increase KCl to 20 bid x 2 days then back to 20 daily. - Continue current Coreg, losartan, and spironolactone.  No BP room for titration.  - Check BMET/BNP today.  2. Atrial fibrillation: Paroxysmal.  He is back in atrial fibrillation today and tolerates it poorly.  He had gone > 1 year without symptomatic atrial fibrillation.  He  feels much worse in atrial fibrillation.  - INR has only been therapeutic since 8/22.  He wants to get out of atrial fibrillation as soon as possible, so I will plan TEE-DCCV later this week.  We discussed risks/benefits for TEE-DCCV and he agrees to proceed.   - Given amiodarone use, check LFTs and TSH, will get yearly eye exam.  I will increase amiodarone to 200 mg bid until DCCV.  - Continue warfarin, check INR today and CBC.  3. Hyperlipidemia: History of nonobstructive CAD.  Continue pravastatin, check lipids today.    Followup in 3 months in CHF clinic.   [name] 11/05/2015

## 2015-11-06 ENCOUNTER — Telehealth: Payer: Self-pay | Admitting: Cardiology

## 2015-11-06 ENCOUNTER — Ambulatory Visit (INDEPENDENT_AMBULATORY_CARE_PROVIDER_SITE_OTHER): Payer: Medicare Other | Admitting: *Deleted

## 2015-11-06 ENCOUNTER — Other Ambulatory Visit (HOSPITAL_COMMUNITY): Payer: Self-pay | Admitting: *Deleted

## 2015-11-06 DIAGNOSIS — I480 Paroxysmal atrial fibrillation: Secondary | ICD-10-CM

## 2015-11-06 DIAGNOSIS — I495 Sick sinus syndrome: Secondary | ICD-10-CM | POA: Diagnosis not present

## 2015-11-06 NOTE — Progress Notes (Signed)
Remote pacemaker transmission.   

## 2015-11-06 NOTE — Telephone Encounter (Signed)
Have reviewed INR and since he is therapeutic he has appt with coumadin clinic on 11/07/2015  Prior to cardioversion on 11/07/2015 at 2pm  so we will see him on that date  Have called and spoke with pt and he states understanding of above appts  Thank you

## 2015-11-06 NOTE — Telephone Encounter (Signed)
Spoke with pt and reminded pt of remote transmission that is due today. Pt verbalized understanding.   

## 2015-11-07 ENCOUNTER — Encounter (HOSPITAL_COMMUNITY): Payer: Self-pay | Admitting: *Deleted

## 2015-11-07 ENCOUNTER — Ambulatory Visit (HOSPITAL_COMMUNITY): Payer: Medicare Other | Admitting: Anesthesiology

## 2015-11-07 ENCOUNTER — Ambulatory Visit (HOSPITAL_COMMUNITY)
Admission: RE | Admit: 2015-11-07 | Discharge: 2015-11-07 | Disposition: A | Discharge status: Home / Self Care | Payer: Medicare Other | Source: Ambulatory Visit | Attending: Cardiology | Admitting: Cardiology

## 2015-11-07 ENCOUNTER — Ambulatory Visit (INDEPENDENT_AMBULATORY_CARE_PROVIDER_SITE_OTHER): Payer: Medicare Other | Admitting: *Deleted

## 2015-11-07 ENCOUNTER — Encounter (HOSPITAL_COMMUNITY)
Admission: RE | Disposition: A | Discharge status: Home / Self Care | Payer: Self-pay | Source: Ambulatory Visit | Attending: Cardiology

## 2015-11-07 ENCOUNTER — Ambulatory Visit (HOSPITAL_BASED_OUTPATIENT_CLINIC_OR_DEPARTMENT_OTHER)
Admission: RE | Admit: 2015-11-07 | Discharge: 2015-11-07 | Disposition: A | Discharge status: Home / Self Care | Payer: Medicare Other | Source: Ambulatory Visit | Attending: Cardiology | Admitting: Cardiology

## 2015-11-07 DIAGNOSIS — N400 Enlarged prostate without lower urinary tract symptoms: Secondary | ICD-10-CM | POA: Insufficient documentation

## 2015-11-07 DIAGNOSIS — I4891 Unspecified atrial fibrillation: Secondary | ICD-10-CM

## 2015-11-07 DIAGNOSIS — Z79899 Other long term (current) drug therapy: Secondary | ICD-10-CM | POA: Insufficient documentation

## 2015-11-07 DIAGNOSIS — Q211 Atrial septal defect: Secondary | ICD-10-CM

## 2015-11-07 DIAGNOSIS — I442 Atrioventricular block, complete: Secondary | ICD-10-CM | POA: Diagnosis not present

## 2015-11-07 DIAGNOSIS — R001 Bradycardia, unspecified: Secondary | ICD-10-CM | POA: Diagnosis not present

## 2015-11-07 DIAGNOSIS — I480 Paroxysmal atrial fibrillation: Secondary | ICD-10-CM | POA: Diagnosis not present

## 2015-11-07 DIAGNOSIS — I428 Other cardiomyopathies: Secondary | ICD-10-CM | POA: Diagnosis not present

## 2015-11-07 DIAGNOSIS — Z5181 Encounter for therapeutic drug level monitoring: Secondary | ICD-10-CM | POA: Diagnosis not present

## 2015-11-07 DIAGNOSIS — Z87891 Personal history of nicotine dependence: Secondary | ICD-10-CM | POA: Insufficient documentation

## 2015-11-07 DIAGNOSIS — I5022 Chronic systolic (congestive) heart failure: Secondary | ICD-10-CM | POA: Diagnosis not present

## 2015-11-07 DIAGNOSIS — E785 Hyperlipidemia, unspecified: Secondary | ICD-10-CM | POA: Insufficient documentation

## 2015-11-07 DIAGNOSIS — Z950 Presence of cardiac pacemaker: Secondary | ICD-10-CM | POA: Diagnosis not present

## 2015-11-07 DIAGNOSIS — Z7901 Long term (current) use of anticoagulants: Secondary | ICD-10-CM | POA: Insufficient documentation

## 2015-11-07 DIAGNOSIS — I2510 Atherosclerotic heart disease of native coronary artery without angina pectoris: Secondary | ICD-10-CM | POA: Diagnosis not present

## 2015-11-07 HISTORY — PX: TEE WITHOUT CARDIOVERSION: SHX5443

## 2015-11-07 HISTORY — PX: CARDIOVERSION: SHX1299

## 2015-11-07 LAB — POCT INR: INR: 2.9

## 2015-11-07 SURGERY — CARDIOVERSION
Anesthesia: Monitor Anesthesia Care

## 2015-11-07 MED ORDER — PROPOFOL 500 MG/50ML IV EMUL
INTRAVENOUS | Status: DC | PRN
  Administered 2015-11-07: 50 ug/kg/min via INTRAVENOUS

## 2015-11-07 MED ORDER — SODIUM CHLORIDE 0.9 % IV SOLN
INTRAVENOUS | Status: DC | PRN
  Administered 2015-11-07: 14:00:00 via INTRAVENOUS

## 2015-11-07 MED ORDER — SODIUM CHLORIDE 0.9 % IV SOLN
INTRAVENOUS | Status: DC
Start: 2015-11-07 — End: 2015-11-09
  Administered 2015-11-07: 13:00:00 via INTRAVENOUS

## 2015-11-07 MED ORDER — AMIODARONE HCL 200 MG PO TABS: 200.0000 mg | ORAL_TABLET | Freq: Every day | ORAL | 3 refills | Status: DC

## 2015-11-07 MED ORDER — ONDANSETRON HCL 4 MG/2ML IJ SOLN
INTRAMUSCULAR | Status: DC | PRN
  Administered 2015-11-07: 4 mg via INTRAVENOUS

## 2015-11-07 MED ORDER — PROPOFOL 10 MG/ML IV BOLUS
INTRAVENOUS | Status: DC | PRN
  Administered 2015-11-07: 20 mg via INTRAVENOUS
  Administered 2015-11-07: 40 mg via INTRAVENOUS

## 2015-11-07 MED ORDER — LIDOCAINE HCL (CARDIAC) 20 MG/ML IV SOLN
INTRAVENOUS | Status: DC | PRN
  Administered 2015-11-07: 50 mg via INTRATRACHEAL

## 2015-11-07 MED ORDER — BUTAMBEN-TETRACAINE-BENZOCAINE 2-2-14 % EX AERO
INHALATION_SPRAY | CUTANEOUS | Status: DC | PRN
  Administered 2015-11-07: 2 via TOPICAL

## 2015-11-07 MED ORDER — ATROPINE SULFATE 1 MG/10ML IJ SOSY
PREFILLED_SYRINGE | INTRAMUSCULAR | Status: AC
  Filled 2015-11-07: qty 10

## 2015-11-07 NOTE — CV Procedure (Addendum)
Procedure: TEE  Indication: Atrial fibrillation  Sedation: Per anesthesiology.  Findings: Please see echo section for full report.  Normal LV size and wall thickness. EF 25-30%, diffuse hypokinesis.  Mildly dilated RV with mildly decreased systolic function.  Pacemaker in RV.  Moderate biatrial enlargement. No LA appendage thrombus.  Moderate TR.  Peak RV-RA gradient 22 mmHg.  Moderate central mitral regurgitation.  Trileaflet aortic valve with no significant stenosis and regurgitation.  There appeared to be a small PFO.  Normal caliber aorta with minimal plaque.    May proceed to DCCV.  [name] 11/07/2015 3:01 PM

## 2015-11-07 NOTE — Procedures (Addendum)
Electrical Cardioversion Procedure Note [name] [id number] April 07, 1929  Procedure: Electrical Cardioversion Indications:  Atrial Fibrillation. INR 2.9, no thrombus on TEE.  Procedure Details Consent: Risks of procedure as well as the alternatives and risks of each were explained to the (patient/caregiver).  Consent for procedure obtained. Time Out: Verified patient identification, verified procedure, site/side was marked, verified correct patient position, special equipment/implants available, medications/allergies/relevent history reviewed, required imaging and test results available.  Performed  Patient placed on cardiac monitor, pulse oximetry, supplemental oxygen as necessary.  Sedation given: Propofol per anesthesiology. Pacer pads placed anterior and posterior chest.  Cardioverted 1 time(s).  Cardioverted at [place].  Evaluation Findings: Post procedure EKG shows: NSR.  St Jude rep interrogated his device post-procedure, no problems.  Complications: None Patient did tolerate procedure well.   [name] 11/07/2015, 3:02 PM

## 2015-11-07 NOTE — Progress Notes (Signed)
  Echocardiogram 2D Echocardiogram has been performed.  [name] 11/07/2015, 3:19 PM

## 2015-11-07 NOTE — H&P (View-Only) (Signed)
Patient ID: [name], male   DOB: [birth year], 82 y.o.   MRN: [id number] PCP: Dr. Yong [name] Cardiology: Dr. Aundra [name]  82 yo with history of paroxysmal atrial fibrillation, nonischemic cardiomyopathy, and complete heart block with St Jude CRT-P system presents for cardiology followup.  He was admitted in 4/14 with pacemaker pocket infection.  His pacemaker was removed and temporary-permanent device was placed.  Later, he had the temporary-permanent device removed and a new CRT-P device was placed. He developed dyspnea post-operatively and was found to have a large right-sided pleural effusion.  He was admitted and got a chest tube for drainage. He had followup with Dr. Servando [name] and it was decided that he would not need VATS.  He had been on amiodarone for maintenance of NSR.  This had been more successful than Tikosyn.   Around 3/15, he started developing increasing exertional dyspnea.  He was short of breath walking up a hill or incline.  He could still walk on the treadmill for exercise for 10-15 minutes and use the elliptical without much trouble on most days.  No orthopnea or PND.  No chest pain.  He saw Dr [name] and was started on Lasix three times a week due to concern for volume overload.  This did not seem to have helped much.  After that, he had PFTs done which showed normal spirometry but DLCO 50% predicted.  Therefore, amiodarone was stopped due to concern for lung toxicity.  He saw [name] for a pre-operative evaluation prior to right inguinal hernia repair.  Given the exertional dyspnea, he was set up for adenosine Cardiolite in 5/15.  This showed EF 36% with a small area of primarily scar in the apex, apical lateral wall, and mid anterolateral wall.  He has not felt palpitations. Echo in 5/15 showed EF 40-45%, similar to prior study.   I had him see pulmonary for evaluation for amiodarone lung toxicity.  CT chest looked ok, and he was not thought to have amiodarone lung toxicity.   At a  prior appointment in 2016, [name] was noted to be back in atrial fibrillation.  He was more short of breath with exertion and fatigued.  He has historically tolerated atrial fibrillation poorly.  I did a TEE-guided DCCV in 5/16 back to NSR.  TEE showed EF 35-40%.  He did not immediately feel better like he has in the past post-DCCV, so Lasix was increased.    He did well for over a year after 5/16, but for the last 1.5 weeks, he has felt like he is back in atrial fibrillation.  He is short of breath walking up hills and with other forms of moderate exertion.  No chest pain.  No orthopnea/PND.    [name] fibrillation, left axis deviation, RBBB  Labs (7/13); LDL 65, HDL 38 Labs (3/14): TSH normal Labs (4/14):  ALT 61, AST 62 Labs (5/14): K 4.5, creatinine 0.9, BNP 727, digoxin 0.9 Labs (6/14): LDL 77, HDL 34, K 4, creatinine 0.9, TSH normal Labs (10/14): digoxin 2.1, K 4.7, creatinine 1.0, TSH normal, LFTs normal Labs (3/15): K 4, creatinine 1.1 Labs (4/15): BNP 482 Labs (5/15): K 3.8, creatinine 1.2 Labs (6/15): K 4.3, creatinine 0.9 Labs (7/15): TSH normal, LFTs normal Labs (9/15): TSH normal, LFTs normal Labs (1/16): K 4.3, creatinine 1.07 Labs (5/16): TSH normal, LFTs normal, K 4.4, creatinine 1.02 Labs (6/16): K 4.4, creatinine 1.29 Labs (9/16): LDL 92, LFTs normal Labs (10/16): K 4.5, creatinine 1.07 Labs (3/17): TSH normal  PMH: 1. Low back pain 2. Hyperlipidemia 3. GERD 4. BPH 5. Polymyalgia rheumatica 6. Atrial fibrillation: Failed Tikosyn in the past.  Paroxysmal, on amiodarone and warfarin.  DCCV in 4/13, 7/13, 10/13.  Has held NSR with amiodarone.  However, concern for amiodarone lung toxicity: PFTs (4/15) with FVC 90% predicted, FEV1 90%, ratio 97%, DLCO 50%.   Recurrence of atrial fibrillation in 5/16, TEE-guided DCCV back to NSR in 5/16. Recurrence of atrial fibrillation in 8/17.  7. Transaminitis: Mild, attributed to amiodarone.  8. Complete heart block s/p St  Jude CRT-P device.  Patient developed PCM pocket infection in 4/14 and had his first system removed with placement of a temporary permanent PCM.  He later had CRT-P device replaced.  This was complicated by large right-sided hemorrhagic pleural effusion requiring chest tube.   9. Nonischemic cardiomyopathy: Echo (9/13) with EF 40-45%, diffuse HK, mild Craig.  Mild CAD only on prior LHC.  Echo (4/14) with EF 45-50%.  Adenosine Cardiolite (5/15) with EF 36% (visually appeared higher per report), small area of scar with peri-infarct ischemia in the apex, apical lateral wall, and mid anterolateral wall. Echo (5/15) with EF 40-45%, basal to mid inferolateral severe hypokinesis, basal to mid anterolateral hypokinesis, mildly dilated RV with mildly decreased RV systolic function, mild to moderate Craig.  TEE (5/16) with EF 35-40%, inferior and inferolateral severe hypokinesis, normal RV size with mildly decreased systolic function, moderate Craig, peak RV-RA gradient 26 mmHg.  10. Large right pleural effusion requiring chest tube following removal and reimplantation of PCM. Most recent chest CT in 6/15 showed significant decrease in size of loculated pleural effusion since 5/14.    SH: Married, prior smoker (quit 1985), no ETOH x years, retired, lives at [facility]  [name]: [name] with [name] at 55, mother with [name].   ROS: All systems reviewed and negative except as per HPI.   Current Outpatient Prescriptions  Medication Sig Dispense Refill  . amiodarone (PACERONE) 200 MG tablet Take 1 tablet (200 mg total) by mouth 2 (two) times daily. 90 tablet 3  . carvedilol (COREG) 12.5 MG tablet Take 6.25 mg by mouth 2 (two) times daily with a meal.    . furosemide (LASIX) 20 MG tablet Take 2 tablets (40 mg total) by mouth daily. 180 tablet 3  . losartan (COZAAR) 25 MG tablet 1/2 tablet (12.5mg  ) by mouth  daily 135 tablet 3  . potassium chloride SA (K-DUR,KLOR-CON) 20 MEQ tablet Take 1 tablet (20 mEq total) by mouth daily. 90 tablet  3  . pravastatin (PRAVACHOL) 80 MG tablet Take 0.5 tablets (40 mg total) by mouth daily. 45 tablet 3  . spironolactone (ALDACTONE) 25 MG tablet Take 0.5 tablets (12.5 mg total) by mouth every evening. 45 tablet 3  . tamsulosin (FLOMAX) 0.4 MG CAPS capsule Take 1 capsule (0.4 mg total) by mouth daily. 90 capsule 3  . warfarin (COUMADIN) 5 MG tablet TAKE 1/2 TO 1 TABLET (2.5 TO 5 MG TOTAL) DAILY AS DIRECTED BY ANTICOAGULATION CLINIC 90 tablet 3   No current facility-administered medications for this encounter.     BP (!) 92/58   Pulse 78   Wt 159 lb (72.1 kg)   SpO2 98%   BMI 25.66 kg/m  General: NAD Neck: JVP 8 cm, no thyromegaly or thyroid nodule.  Lungs: CTAB  CV: Nondisplaced PMI.  Heart irregular S1/S2, no S3/S4, no murmur.  1+ edema 1/2 up lower legs bilaterally.  No carotid bruit.  Normal pedal pulses.  Abdomen: Soft,  nontender, no hepatosplenomegaly, no distention.  Neurologic: Alert and oriented x 3.  Psych: Normal affect. Extremities: No clubbing or cyanosis.   Assessment/Plan: 1. Chronic systolic CHF: NYHA class II-III symptoms with mild volume overload on exam today in setting of recurrent atrial fibrillation.  He has CRT-P.  TEE in 5/16 showed EF 35-40% with wall motion abnormalities. Presumed nonischemic cardiomyopathy (prior cath with only mild CAD).  He had an adenosine Cardiolite in 5/15 with EF 36% and possible small area of apical and lateral scar with peri-infarct ischemia.  - I suspect that he will feel better symptomatically with resumption of NSR.  - I will have him increase Lasix to 40 mg bid x 2 days then back to 40 mg daily.  I will increase KCl to 20 bid x 2 days then back to 20 daily. - Continue current Coreg, losartan, and spironolactone.  No BP room for titration.  - Check BMET/BNP today.  2. Atrial fibrillation: Paroxysmal.  He is back in atrial fibrillation today and tolerates it poorly.  He had gone > 1 year without symptomatic atrial fibrillation.  He  feels much worse in atrial fibrillation.  - INR has only been therapeutic since 8/22.  He wants to get out of atrial fibrillation as soon as possible, so I will plan TEE-DCCV later this week.  We discussed risks/benefits for TEE-DCCV and he agrees to proceed.   - Given amiodarone use, check LFTs and TSH, will get yearly eye exam.  I will increase amiodarone to 200 mg bid until DCCV.  - Continue warfarin, check INR today and CBC.  3. Hyperlipidemia: History of nonobstructive CAD.  Continue pravastatin, check lipids today.    Followup in 3 months in CHF clinic.   [name] 11/05/2015

## 2015-11-07 NOTE — Transfer of Care (Signed)
Immediate Anesthesia Transfer of Care Note  Patient: [name]  Procedure(s) Performed: Procedure(s): CARDIOVERSION (N/A) TRANSESOPHAGEAL ECHOCARDIOGRAM (TEE) (N/A)  Patient Location: Endoscopy Unit  Anesthesia Type:MAC  Level of Consciousness: awake, oriented, sedated, patient cooperative and responds to stimulation  Airway & Oxygen Therapy: Patient Spontanous Breathing and Patient connected to nasal cannula oxygen  Post-op Assessment: Report given to RN, Post -op Vital signs reviewed and stable, Patient moving all extremities and Patient moving all extremities X 4  Post vital signs: Reviewed and stable  Last Vitals:  Vitals:   11/07/15 1219  BP: 96/70  Pulse: 84  Resp: 11  Temp: 36.7 C    Last Pain:  Vitals:   11/07/15 1219  TempSrc: Oral         Complications: No apparent anesthesia complications

## 2015-11-07 NOTE — Anesthesia Procedure Notes (Signed)
Procedure Name: MAC Date/Time: 11/07/2015 2:36 PM Performed by: Jacquiline Doe A Pre-anesthesia Checklist: Patient identified, Emergency Drugs available, Suction available and Patient being monitored Patient Re-evaluated:Patient Re-evaluated prior to inductionOxygen Delivery Method: Nasal cannula Intubation Type: IV induction Airway Equipment and Method: Patient positioned with wedge pillow and Bite block Placement Confirmation: positive ETCO2 Dental Injury: Teeth and Oropharynx as per pre-operative assessment

## 2015-11-07 NOTE — Anesthesia Preprocedure Evaluation (Signed)
Anesthesia Evaluation  Patient identified by MRN, date of birth, ID band Patient awake    Reviewed: Allergy & Precautions, NPO status , Patient's Chart, lab work & pertinent test results  Airway Mallampati: II  TM Distance: >3 FB Neck ROM: Full    Dental  (+) Teeth Intact, Dental Advisory Given   Pulmonary former smoker,    breath sounds clear to auscultation       Cardiovascular  Rhythm:Irregular Rate:Normal     Neuro/Psych    GI/Hepatic   Endo/Other    Renal/GU      Musculoskeletal   Abdominal   Peds  Hematology   Anesthesia Other Findings   Reproductive/Obstetrics                             Anesthesia Physical Anesthesia Plan  ASA: III  Anesthesia Plan: MAC and General   Post-op Pain Management:    Induction: Intravenous  Airway Management Planned: Mask and Nasal Cannula  Additional Equipment:   Intra-op Plan:   Post-operative Plan:   Informed Consent: I have reviewed the patients History and Physical, chart, labs and discussed the procedure including the risks, benefits and alternatives for the proposed anesthesia with the patient or authorized representative who has indicated his/her understanding and acceptance.   Dental advisory given  Plan Discussed with: CRNA and Anesthesiologist  Anesthesia Plan Comments:         Anesthesia Quick Evaluation

## 2015-11-07 NOTE — Interval H&P Note (Signed)
History and Physical Interval Note:  11/07/2015 2:44 PM  [name]  has presented today for surgery, with the diagnosis of afib  The various methods of treatment have been discussed with the patient and family. After consideration of risks, benefits and other options for treatment, the patient has consented to  Procedure(s): CARDIOVERSION (N/A) TRANSESOPHAGEAL ECHOCARDIOGRAM (TEE) (N/A) as a surgical intervention .  The patient's history has been reviewed, patient examined, no change in status, stable for surgery.  I have reviewed the patient's chart and labs.  Questions were answered to the patient's satisfaction.     [place] [employer]

## 2015-11-07 NOTE — Discharge Instructions (Signed)
YOU HAD AN CARDIAC PROCEDURE TODAY: Refer to the procedure report and other information in the discharge instructions given to you for any specific questions about what was found during the examination. If this information does not answer your questions, please call [facility] office at [phone] to clarify.   DIET: Your first meal following the procedure should be a light meal and then it is ok to progress to your normal diet. A half-sandwich or bowl of soup is an example of a good first meal. Heavy or fried foods are harder to digest and may make you feel nauseous or bloated. Drink plenty of fluids but you should avoid alcoholic beverages for 24 hours. If you had a esophageal dilation, please see attached instructions for diet.   ACTIVITY: Your care partner should take you home directly after the procedure. You should plan to take it easy, moving slowly for the rest of the day. You can resume normal activity the day after the procedure however YOU SHOULD NOT DRIVE, use power tools, machinery or perform tasks that involve climbing or major physical exertion for 24 hours (because of the sedation medicines used during the test).   SYMPTOMS TO REPORT IMMEDIATELY: A cardiologist can be reached at any hour. Please call [phone] for any of the following symptoms:  Vomiting of blood or coffee ground material  New, significant abdominal pain  New, significant chest pain or pain under the shoulder blades  Painful or persistently difficult swallowing  New shortness of breath  Black, tarry-looking or red, bloody stools  FOLLOW UP:  Please also call with any specific questions about appointments or follow up tests.  Electrical Cardioversion, Care After Refer to this sheet in the next few weeks. These instructions provide you with information on caring for yourself after your procedure. Your health care provider may also give you more specific instructions. Your treatment has been planned  according to current medical practices, but problems sometimes occur. Call your health care provider if you have any problems or questions after your procedure. WHAT TO EXPECT AFTER THE PROCEDURE After your procedure, it is typical to have the following sensations:  Some redness on the skin where the shocks were delivered. If this is tender, a sunburn lotion or hydrocortisone cream may help.  Possible return of an abnormal heart rhythm within hours or days after the procedure. HOME CARE INSTRUCTIONS  Take medicines only as directed by your health care provider. Be sure you understand how and when to take your medicine.  Learn how to feel your pulse and check it often.  Limit your activity for 48 hours after the procedure or as directed by your health care provider.  Avoid or minimize caffeine and other stimulants as directed by your health care provider. SEEK MEDICAL CARE IF:  You feel like your heart is beating too fast or your pulse is not regular.  You have any questions about your medicines.  You have bleeding that will not stop. SEEK IMMEDIATE MEDICAL CARE IF:  You are dizzy or feel faint.  It is hard to breathe or you feel short of breath.  There is a change in discomfort in your chest.  Your speech is slurred or you have trouble moving an arm or leg on one side of your body.  You get a serious muscle cramp that does not go away.  Your fingers or toes turn cold or blue.   This information is not intended to replace advice given to you by your health  care provider. Make sure you discuss any questions you have with your health care provider.   Document Released: 12/14/2012 Document Revised: 03/16/2014 Document Reviewed: 12/14/2012 Elsevier Interactive Patient Education 2016 [place] Monitored anesthesia care is an anesthesia service for a medical procedure. Anesthesia is the loss of the ability to feel pain. It is produced by medicines  called anesthetics. It may affect a small area of your body (local anesthesia), a large area of your body (regional anesthesia), or your entire body (general anesthesia). The need for monitored anesthesia care depends your procedure, your condition, and the potential need for regional or general anesthesia. It is often provided during procedures where:   General anesthesia may be needed if there are complications. This is because you need special care when you are under general anesthesia.   You will be under local or regional anesthesia. This is so that you are able to have higher levels of anesthesia if needed.   You will receive calming medicines (sedatives). This is especially the case if sedatives are given to put you in a semi-conscious state of relaxation (deep sedation). This is because the amount of sedative needed to produce this state can be hard to predict. Too much of a sedative can produce general anesthesia. Monitored anesthesia care is performed by one or more health care providers who have special training in all types of anesthesia. You will need to meet with these health care providers before your procedure. During this meeting, they will ask you about your medical history. They will also give you instructions to follow. (For example, you will need to stop eating and drinking before your procedure. You may also need to stop or change medicines you are taking.) During your procedure, your health care providers will stay with you. They will:   Watch your condition. This includes watching your blood pressure, breathing, and level of pain.   Diagnose and treat problems that occur.   Give medicines if they are needed. These may include calming medicines (sedatives) and anesthetics.   Make sure you are comfortable.  Having monitored anesthesia care does not necessarily mean that you will be under anesthesia. It does mean that your health care providers will be able to manage  anesthesia if you need it or if it occurs. It also means that you will be able to have a different type of anesthesia than you are having if you need it. When your procedure is complete, your health care providers will continue to watch your condition. They will make sure any medicines wear off before you are allowed to go home.    This information is not intended to replace advice given to you by your health care provider. Make sure you discuss any questions you have with your health care provider.   Document Released: 11/19/2004 Document Revised: 03/16/2014 Document Reviewed: 04/06/2012 Elsevier Interactive Patient Education [employer].

## 2015-11-08 ENCOUNTER — Encounter: Payer: Self-pay | Admitting: Cardiology

## 2015-11-08 ENCOUNTER — Ambulatory Visit: Payer: Medicare Other | Admitting: Family Medicine

## 2015-11-12 ENCOUNTER — Telehealth (HOSPITAL_COMMUNITY): Payer: Self-pay | Admitting: *Deleted

## 2015-11-12 NOTE — Telephone Encounter (Signed)
Pt called to report he felt like he was going in/out of a-fib yesterday and has been in a-fib all morning today, he is on Amio 200 mg daily.  Discussed w/Dr Aundra Dubin, he would like pt to increase Amio to 400 mg Twice daily for today and tomorrow and call us Thur AM with update.  Pt aware and agreeable

## 2015-11-14 ENCOUNTER — Telehealth (HOSPITAL_COMMUNITY): Payer: Self-pay | Admitting: *Deleted

## 2015-11-14 NOTE — Telephone Encounter (Signed)
Pt called to report he is still in a-fib and he does not feel good at all.  He states he is SOB with little activity and is having a little chest tightness.  He is still taking the Amio 400 mg BID.  Will discuss w/Dr Aundra Dubin and call him back

## 2015-11-14 NOTE — Telephone Encounter (Signed)
Per Dr Aundra Dubin, pt has 2 choices:  1. If feeling very bad should go to ER tonight 2. If not too bad can take extra 40 mg of Lasix tonight and see Roderic Palau, NP in a-fib clinic tomorrow.  Spoke w/pt and discussed both options, pt states he does not feel like he needs to go to ER, appt sch for a-fib clinic tom at 12 pm

## 2015-11-15 ENCOUNTER — Ambulatory Visit (INDEPENDENT_AMBULATORY_CARE_PROVIDER_SITE_OTHER): Payer: Medicare Other | Admitting: *Deleted

## 2015-11-15 ENCOUNTER — Ambulatory Visit (HOSPITAL_COMMUNITY)
Admission: RE | Admit: 2015-11-15 | Discharge: 2015-11-15 | Disposition: A | Discharge status: Home / Self Care | Payer: Medicare Other | Source: Ambulatory Visit | Attending: Nurse Practitioner | Admitting: Nurse Practitioner

## 2015-11-15 ENCOUNTER — Encounter (HOSPITAL_COMMUNITY): Payer: Self-pay | Admitting: Nurse Practitioner

## 2015-11-15 VITALS — BP 118/74 | HR 79 | Ht 66.0 in | Wt 157.6 lb

## 2015-11-15 DIAGNOSIS — R0600 Dyspnea, unspecified: Secondary | ICD-10-CM | POA: Insufficient documentation

## 2015-11-15 DIAGNOSIS — E785 Hyperlipidemia, unspecified: Secondary | ICD-10-CM | POA: Diagnosis not present

## 2015-11-15 DIAGNOSIS — Z87891 Personal history of nicotine dependence: Secondary | ICD-10-CM | POA: Diagnosis not present

## 2015-11-15 DIAGNOSIS — I480 Paroxysmal atrial fibrillation: Secondary | ICD-10-CM | POA: Diagnosis not present

## 2015-11-15 DIAGNOSIS — Z7901 Long term (current) use of anticoagulants: Secondary | ICD-10-CM | POA: Insufficient documentation

## 2015-11-15 DIAGNOSIS — N400 Enlarged prostate without lower urinary tract symptoms: Secondary | ICD-10-CM | POA: Diagnosis not present

## 2015-11-15 DIAGNOSIS — Z5181 Encounter for therapeutic drug level monitoring: Secondary | ICD-10-CM

## 2015-11-15 DIAGNOSIS — I517 Cardiomegaly: Secondary | ICD-10-CM | POA: Insufficient documentation

## 2015-11-15 DIAGNOSIS — M353 Polymyalgia rheumatica: Secondary | ICD-10-CM | POA: Insufficient documentation

## 2015-11-15 DIAGNOSIS — K648 Other hemorrhoids: Secondary | ICD-10-CM | POA: Insufficient documentation

## 2015-11-15 DIAGNOSIS — Z79899 Other long term (current) drug therapy: Secondary | ICD-10-CM | POA: Diagnosis not present

## 2015-11-15 DIAGNOSIS — I429 Cardiomyopathy, unspecified: Secondary | ICD-10-CM | POA: Diagnosis not present

## 2015-11-15 DIAGNOSIS — R0602 Shortness of breath: Secondary | ICD-10-CM | POA: Diagnosis not present

## 2015-11-15 DIAGNOSIS — M1990 Unspecified osteoarthritis, unspecified site: Secondary | ICD-10-CM | POA: Diagnosis not present

## 2015-11-15 DIAGNOSIS — J984 Other disorders of lung: Secondary | ICD-10-CM | POA: Insufficient documentation

## 2015-11-15 DIAGNOSIS — I2510 Atherosclerotic heart disease of native coronary artery without angina pectoris: Secondary | ICD-10-CM | POA: Diagnosis not present

## 2015-11-15 DIAGNOSIS — Z888 Allergy status to other drugs, medicaments and biological substances status: Secondary | ICD-10-CM | POA: Diagnosis not present

## 2015-11-15 DIAGNOSIS — I4892 Unspecified atrial flutter: Secondary | ICD-10-CM | POA: Insufficient documentation

## 2015-11-15 DIAGNOSIS — Z9889 Other specified postprocedural states: Secondary | ICD-10-CM | POA: Insufficient documentation

## 2015-11-15 DIAGNOSIS — I495 Sick sinus syndrome: Secondary | ICD-10-CM | POA: Diagnosis not present

## 2015-11-15 DIAGNOSIS — K219 Gastro-esophageal reflux disease without esophagitis: Secondary | ICD-10-CM | POA: Insufficient documentation

## 2015-11-15 DIAGNOSIS — I4891 Unspecified atrial fibrillation: Secondary | ICD-10-CM | POA: Diagnosis not present

## 2015-11-15 DIAGNOSIS — I509 Heart failure, unspecified: Secondary | ICD-10-CM | POA: Insufficient documentation

## 2015-11-15 LAB — CBC
HEMATOCRIT: 40 % (ref 39.0–52.0)
HEMOGLOBIN: 13.5 g/dL (ref 13.0–17.0)
MCH: 30.3 pg (ref 26.0–34.0)
MCHC: 33.8 g/dL (ref 30.0–36.0)
MCV: 89.7 fL (ref 78.0–100.0)
Platelets: 167 10*3/uL (ref 150–400)
RBC: 4.46 MIL/uL (ref 4.22–5.81)
RDW: 15.7 % — ABNORMAL HIGH (ref 11.5–15.5)
WBC: 5.5 10*3/uL (ref 4.0–10.5)

## 2015-11-15 LAB — POCT INR: INR: 2.7

## 2015-11-15 LAB — BASIC METABOLIC PANEL
Anion gap: 7 (ref 5–15)
BUN: 24 mg/dL — ABNORMAL HIGH (ref 6–20)
CHLORIDE: 105 mmol/L (ref 101–111)
CO2: 28 mmol/L (ref 22–32)
CREATININE: 1.31 mg/dL — AB (ref 0.61–1.24)
Calcium: 9.1 mg/dL (ref 8.9–10.3)
GFR calc non Af Amer: 48 mL/min — ABNORMAL LOW (ref >60)
GFR, EST AFRICAN AMERICAN: 56 mL/min — AB (ref >60)
Glucose, Bld: 103 mg/dL — ABNORMAL HIGH (ref 65–99)
POTASSIUM: 4.1 mmol/L (ref 3.5–5.1)
SODIUM: 140 mmol/L (ref 135–145)

## 2015-11-15 LAB — BRAIN NATRIURETIC PEPTIDE: B Natriuretic Peptide: 836.8 pg/mL — ABNORMAL HIGH (ref 0.0–100.0)

## 2015-11-15 MED ORDER — POTASSIUM CHLORIDE CRYS ER 20 MEQ PO TBCR: 20.0000 meq | EXTENDED_RELEASE_TABLET | Freq: Two times a day (BID) | ORAL | 1 refills | Status: DC

## 2015-11-15 MED ORDER — FUROSEMIDE 40 MG PO TABS: 40.0000 mg | ORAL_TABLET | Freq: Two times a day (BID) | ORAL | 2 refills | Status: DC

## 2015-11-15 MED ORDER — FUROSEMIDE 40 MG PO TABS: 40.0000 mg | ORAL_TABLET | Freq: Two times a day (BID) | ORAL | 1 refills | Status: DC

## 2015-11-15 MED ORDER — POTASSIUM CHLORIDE CRYS ER 20 MEQ PO TBCR: 20.0000 meq | EXTENDED_RELEASE_TABLET | Freq: Two times a day (BID) | ORAL | 2 refills | Status: DC

## 2015-11-15 NOTE — Progress Notes (Signed)
Primary Care Physician: Garret Reddish, MD Referring Physician: Dr. Gerarda Fraction [name] is a 82 y.o. male with a h/o complete heart block with St Jude CRT-P system,  PAF, nonischemic cardiomyopathy, having failed tikosyn, on amiodarone and has had multiple cardioversion's over the years. His last cardioversion was one week ago and was successful. He felt well thru this past Saturday and then started becoming more short of breath with activity with walking associated with feeling hot, fatigued, with chest tightness that has been present for several days. He called into the HF office a couple of days ago and amiodarone was increased for two days without any change. Yesterday was told to take extra lasix last pm and come to the aifb office today. He does feel better today with less shortness of breath but not back to his baseline, still having  shortness of breath with activity. Does not describe any PND, orthopnea. EKG shows intermittent paced rhythm and interogation of device shows a flutter, onset after cardioversion cannot be determined.   Dr. Aundra Dubin in and examined pt as well and believes that atrial arrhythmia is worsening his HF and  fluid overload  contributing to his symptoms. He would like pt to be diuresed and undergo another DCCV next week.  Today, he denies symptoms of, orthopnea, PND, lower extremity edema, dizziness, presyncope, syncope, or neurologic sequela. Positive for shortness of breath with chest tightness and fatigue. The patient is tolerating medications without difficulties and is otherwise without complaint today.   Past Medical History:  Diagnosis Date  . Arthritis    "back" (03/20/2014)  . Atrial fibrillation ([place])   . BPH (benign prostatic hypertrophy)   . Breast mass    "on both sides" (06/28/2012)  . Cardiomyopathy primary-nonischemic EF 45%  . CHF (congestive heart failure) ([place])   . Coronary artery disease   . Diverticulosis of colon without hemorrhage  01/03/2014  . Dysphagia   . Elevated liver enzymes   . Esophageal stricture   . GERD (gastroesophageal reflux disease)   . Hearing loss, mixed, bilateral   . Hx of cardiovascular stress test    Adenosine Myoview (07/2013):  Apical cap, apical lateral and mid anterolateral scar, small amount of peri-infarct ischemia, EF 36%; Medium Risk  . Hyperlipidemia   . Increased prostate specific antigen (PSA) velocity   . Internal hemorrhoids 01/03/2014  . Lumbar back pain   . Pacemaker    st jude  . Pacemaker infection ([place])    06/28/12  . Polymyalgia rheumatica ([place])   . Sick sinus syndrome [facility])    Past Surgical History:  Procedure Laterality Date  . BREAST SURGERY    . CARDIAC CATHETERIZATION  1980's   [place]  . CARDIOVERSION  06/17/2011   Procedure: CARDIOVERSION;  Surgeon: Lelon Perla, MD;  Location: [place];  Service: Cardiovascular;  Laterality: N/A;  . CARDIOVERSION  09/09/2011   Procedure: CARDIOVERSION;  Surgeon: Carlena Bjornstad, MD;  Location: [place];  Service: Cardiovascular;  Laterality: N/A;  . CARDIOVERSION  01/01/2012   Procedure: CARDIOVERSION;  Surgeon: Hillary Bow, MD;  Location: [facility] ENDOSCOPY;  Service: Cardiovascular;  Laterality: N/A;  . CARDIOVERSION N/A 08/03/2014   Procedure: CARDIOVERSION;  Surgeon: Larey Dresser, MD;  Location: [facility] ENDOSCOPY;  Service: Cardiovascular;  Laterality: N/A;  . CARDIOVERSION N/A 11/07/2015   Procedure: CARDIOVERSION;  Surgeon: Larey Dresser, MD;  Location: [place];  Service: Cardiovascular;  Laterality: N/A;  . CATARACT EXTRACTION, BILATERAL Bilateral 1980's  .  COLONOSCOPY N/A 01/03/2014   Procedure: COLONOSCOPY;  Surgeon: Gatha Mayer, MD;  Location: [facility] ENDOSCOPY;  Service: Endoscopy;  Laterality: N/A;  . ESOPHAGOGASTRODUODENOSCOPY    . GENERATOR REMOVAL Left 06/15/2012   Procedure: GENERATOR REMOVAL;  Surgeon: Evans Lance, MD;  Location: [place];  Service: Cardiovascular;  Laterality: Left;  . INGUINAL HERNIA REPAIR Right  2012; 03/20/2014  . INGUINAL HERNIA REPAIR Right 03/20/2014   Procedure: OPEN REPAIR OF RIGHT INGUINAL WITH MESH;  Surgeon: Donnie Mesa, MD;  Location: [place];  Service: General;  Laterality: Right;  . INSERT / REPLACE / REMOVE PACEMAKER    . INSERTION OF MESH Right 03/20/2014   Procedure: INSERTION OF MESH;  Surgeon: Donnie Mesa, MD;  Location: [place];  Service: General;  Laterality: Right;  . LEAD REVISION N/A 06/29/2012   Procedure: LEAD REVISION;  Surgeon: Deboraha Sprang, MD;  Location[facility] CATH LAB;  Service: Cardiovascular;  Laterality: N/A;  . PACEMAKER GENERATOR CHANGE N/A 06/15/2012   Procedure: PACEMAKER GENERATOR CHANGE;  Surgeon: Evans Lance, MD;  Location: [place];  Service: Cardiovascular;  Laterality: N/A;  . PACEMAKER REVISION  06/30/2012   Procedure: PACEMAKER LEAD REVISION;  Surgeon: Evans Lance, MD;  Location: [facility] CATH LAB;  Service: Cardiovascular;;  . PARACENTESIS  ~ 0000000   complication from pacer change in 2014  . PERMANENT PACEMAKER INSERTION N/A 06/28/2012   Procedure: PERMANENT PACEMAKER INSERTION;  Surgeon: Evans Lance, MD;  Location[facility] C[facility] LAB;  Service: Cardiovascular;  Laterality: N/A;  . TEE WITHOUT CARDIOVERSION  01/01/2012   Procedure: TRANSESOPHAGEAL ECHOCARDIOGRAM (TEE);  Surgeon: Fay Records, MD;  Location: [facility] ENDOSCOPY;  Service: Cardiovascular;  Laterality: N/A;  . TEE WITHOUT CARDIOVERSION N/A 08/03/2014   Procedure: TRANSESOPHAGEAL ECHOCARDIOGRAM (TEE);  Surgeon: Larey Dresser, MD;  Location: [place];  Service: Cardiovascular;  Laterality: N/A;  . TEE WITHOUT CARDIOVERSION N/A 11/07/2015   Procedure: TRANSESOPHAGEAL ECHOCARDIOGRAM (TEE);  Surgeon: Larey Dresser, MD;  Location: [place];  Service: Cardiovascular;  Laterality: N/A;  . TONSILLECTOMY AND ADENOIDECTOMY  1936  . UMBILICAL HERNIA REPAIR  [phone] X [phone]    Current Outpatient Prescriptions  Medication Sig Dispense Refill  . amiodarone (PACERONE) 200 MG tablet Take 1 tablet (200  mg total) by mouth daily. 90 tablet 3  . carvedilol (COREG) 12.5 MG tablet Take 6.25 mg by mouth 2 (two) times daily with a meal.    . furosemide (LASIX) 40 MG tablet Take 1 tablet (40 mg total) by mouth 2 (two) times daily. 60 tablet 1  . losartan (COZAAR) 25 MG tablet 1/2 tablet (12.5mg  ) by mouth  daily 135 tablet 3  . potassium chloride SA (K-DUR,KLOR-CON) 20 MEQ tablet Take 1 tablet (20 mEq total) by mouth 2 (two) times daily. 60 tablet 1  . pravastatin (PRAVACHOL) 80 MG tablet Take 0.5 tablets (40 mg total) by mouth daily. 45 tablet 3  . spironolactone (ALDACTONE) 25 MG tablet Take 0.5 tablets (12.5 mg total) by mouth every evening. 45 tablet 3  . tamsulosin (FLOMAX) 0.4 MG CAPS capsule Take 1 capsule (0.4 mg total) by mouth daily. 90 capsule 3  . warfarin (COUMADIN) 5 MG tablet TAKE 1/2 TO 1 TABLET (2.5 TO 5 MG TOTAL) DAILY AS DIRECTED BY ANTICOAGULATION CLINIC 90 tablet 3   No current facility-administered medications for this encounter.     Allergies  Allergen Reactions  . Antihistamines, Diphenhydramine-Type Other (See Comments)    Causes difficulty in ability to urinate.  Social History   Social History  . Marital status: Married    Spouse name: N/A  . Number of children: 3  . Years of education: N/A   Occupational History  . retired Retired    Engineering geologist   Social History Main Topics  . Smoking status: Former Smoker    Packs/day: 1.00    Years: 40.00    Types: Cigarettes    Quit date: 03/10/1983  . Smokeless tobacco: Never Used  . 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 Drug use: No  . Sexual activity: Not Currently   Other Topics Concern  . Not on file   Social History Narrative   Married. 3 children (6 together). 5 grandkids with with 5 grandkids (2nd marriage). No greatgrandkids.    One story home.   Retired from [employer]   Education: college   Hobbies: play  golf, bridge, garden, write family stories    Family History  Problem Relation Age of Onset  . Heart attack Father 64    deceased  . AAA (abdominal aortic aneurysm) Mother     deceased AAA  . Hypertension Mother   . Other Brother     deceased at birth  . Healthy Son   . Healthy Daughter   . Diabetes Cousin     ROS- All systems are reviewed and negative except as per the HPI above  Physical Exam: Vitals:   11/15/15 1207  BP: 118/74  Pulse: 79  Weight: 157 lb 9.6 oz (71.5 kg)  Height: 5\' 6"  (1.676 m)    GEN- The patient is well appearing, alert and oriented x 3 today.   Head- normocephalic, atraumatic Eyes-  Sclera clear, conjunctiva pink Ears- hearing intact Oropharynx- clear Neck- supple, no JVP Lymph- no cervical lymphadenopathy Lungs- Clear to ausculation bilaterally, normal work of breathing, mildly diminished breath sounds rt base Heart- irregular rate and rhythm, no murmurs, rubs or gallops, PMI not laterally displaced GI- soft, NT, ND, + BS Extremities- no clubbing, cyanosis, or edema MS- no significant deformity or atrophy Skin- no rash or lesion Psych- euthymic mood, full affect Neuro- strength and sensation are intact  EKG- AV paced with frequent v paced complexes with PAC's.Probable a flutter with v rate of 79 bpm.  Interrogation by Lovey Newcomer, [address] Jude rep, shows aflutter, onset after cardioversion unknown. He is undersensing fib/flutter waves and causing more atrial pacing, Fib/flutter is diminishing BIV pacing.. Reprogrammed to make more sensitive to  fib/flutter waves.  Assessment and Plan: 1. PAF Has returned to symptomatic aflutter after successful cardioversion 8/31 Feels significantly better with improved HF symptoms in SR Will be scheduled for repeat cardioversion 9/13 with Dr. Aundra Dubin Pt had a TEE with cardioversion one week ago,without thrombus, and has had therapeutic INR's on 8/31-2.9, today at 2.7 and per Dr. Aundra Dubin if therapeutic INR the day of the  cardioversion(set up at [place]), he will not require TEE He is to increase Lasix to 40 mg bid and KCL to 20 meq bid  Continue amiodarone at 200 mg a day He will need bmet am prior to cardioversion Bmet, BNP,CBC today CXR today  F/u with Dr. Aundra Dubin in office 9/14  Butch Penny C. Rosan Calbert, [facility] [address] [place], [place] [address] [phone]

## 2015-11-15 NOTE — Patient Instructions (Addendum)
Cardioversion scheduled for Wednesday, September 13th  - Arrive at the [facility] and go to admitting at 12PM  -Do not eat or drink anything after midnight the night prior to your procedure.  - Take all your medication with a sip of water prior to arrival.  - You will not be able to drive home after your procedure.  Your physician has recommended you make the following change in your medication:  1)Increase lasix to 40mg  twice a day 2)Increase potassium to 41meq twice a day

## 2015-11-15 NOTE — Anesthesia Postprocedure Evaluation (Signed)
Anesthesia Post Note  Patient: [name]  Procedure(s) Performed: Procedure(s) (LRB): CARDIOVERSION (N/A) TRANSESOPHAGEAL ECHOCARDIOGRAM (TEE) (N/A)  Patient location during evaluation: Endoscopy Anesthesia Type: MAC Level of consciousness: awake, awake and alert and oriented Pain management: pain level controlled Vital Signs Assessment: post-procedure vital signs reviewed and stable Respiratory status: spontaneous breathing, nonlabored ventilation and respiratory function stable Cardiovascular status: blood pressure returned to baseline Anesthetic complications: no    Last Vitals:  Vitals:   11/07/15 1530 11/07/15 1535  BP: 99/63 101/67  Pulse: 69 73  Resp: 17 14  Temp:      Last Pain:  Vitals:   11/07/15 1523  TempSrc: Oral                 Josiah Nieto COKER

## 2015-11-19 ENCOUNTER — Encounter: Payer: Self-pay | Admitting: Internal Medicine

## 2015-11-20 ENCOUNTER — Ambulatory Visit (INDEPENDENT_AMBULATORY_CARE_PROVIDER_SITE_OTHER): Payer: Medicare Other | Admitting: *Deleted

## 2015-11-20 ENCOUNTER — Ambulatory Visit (HOSPITAL_COMMUNITY)
Admission: RE | Admit: 2015-11-20 | Discharge: 2015-11-20 | Disposition: A | Discharge status: Home / Self Care | Payer: Medicare Other | Source: Ambulatory Visit | Attending: Cardiology | Admitting: Cardiology

## 2015-11-20 ENCOUNTER — Encounter (HOSPITAL_COMMUNITY)
Admission: RE | Disposition: A | Discharge status: Home / Self Care | Payer: Self-pay | Source: Ambulatory Visit | Attending: Cardiology

## 2015-11-20 ENCOUNTER — Ambulatory Visit (HOSPITAL_COMMUNITY): Payer: Medicare Other | Admitting: Anesthesiology

## 2015-11-20 ENCOUNTER — Encounter (HOSPITAL_COMMUNITY): Payer: Self-pay | Admitting: Anesthesiology

## 2015-11-20 DIAGNOSIS — K219 Gastro-esophageal reflux disease without esophagitis: Secondary | ICD-10-CM | POA: Diagnosis not present

## 2015-11-20 DIAGNOSIS — Z5181 Encounter for therapeutic drug level monitoring: Secondary | ICD-10-CM | POA: Diagnosis not present

## 2015-11-20 DIAGNOSIS — I480 Paroxysmal atrial fibrillation: Secondary | ICD-10-CM | POA: Insufficient documentation

## 2015-11-20 DIAGNOSIS — Z950 Presence of cardiac pacemaker: Secondary | ICD-10-CM | POA: Insufficient documentation

## 2015-11-20 DIAGNOSIS — I509 Heart failure, unspecified: Secondary | ICD-10-CM | POA: Insufficient documentation

## 2015-11-20 DIAGNOSIS — Z87891 Personal history of nicotine dependence: Secondary | ICD-10-CM | POA: Diagnosis not present

## 2015-11-20 DIAGNOSIS — I2510 Atherosclerotic heart disease of native coronary artery without angina pectoris: Secondary | ICD-10-CM | POA: Diagnosis not present

## 2015-11-20 DIAGNOSIS — I4891 Unspecified atrial fibrillation: Secondary | ICD-10-CM | POA: Diagnosis not present

## 2015-11-20 DIAGNOSIS — Z79899 Other long term (current) drug therapy: Secondary | ICD-10-CM | POA: Insufficient documentation

## 2015-11-20 DIAGNOSIS — I442 Atrioventricular block, complete: Secondary | ICD-10-CM | POA: Insufficient documentation

## 2015-11-20 DIAGNOSIS — I4892 Unspecified atrial flutter: Secondary | ICD-10-CM | POA: Insufficient documentation

## 2015-11-20 DIAGNOSIS — E785 Hyperlipidemia, unspecified: Secondary | ICD-10-CM | POA: Insufficient documentation

## 2015-11-20 DIAGNOSIS — I495 Sick sinus syndrome: Secondary | ICD-10-CM | POA: Diagnosis not present

## 2015-11-20 DIAGNOSIS — N400 Enlarged prostate without lower urinary tract symptoms: Secondary | ICD-10-CM | POA: Diagnosis not present

## 2015-11-20 DIAGNOSIS — R0602 Shortness of breath: Secondary | ICD-10-CM | POA: Diagnosis not present

## 2015-11-20 DIAGNOSIS — I428 Other cardiomyopathies: Secondary | ICD-10-CM | POA: Diagnosis not present

## 2015-11-20 DIAGNOSIS — Z7901 Long term (current) use of anticoagulants: Secondary | ICD-10-CM | POA: Insufficient documentation

## 2015-11-20 HISTORY — PX: CARDIOVERSION: SHX1299

## 2015-11-20 LAB — BASIC METABOLIC PANEL
ANION GAP: 9 (ref 5–15)
BUN: 29 mg/dL — ABNORMAL HIGH (ref 6–20)
CALCIUM: 8.9 mg/dL (ref 8.9–10.3)
CO2: 22 mmol/L (ref 22–32)
CREATININE: 1.32 mg/dL — AB (ref 0.61–1.24)
Chloride: 107 mmol/L (ref 101–111)
GFR, EST AFRICAN AMERICAN: 55 mL/min — AB (ref >60)
GFR, EST NON AFRICAN AMERICAN: 48 mL/min — AB (ref >60)
Glucose, Bld: 86 mg/dL (ref 65–99)
Potassium: 3.4 mmol/L — ABNORMAL LOW (ref 3.5–5.1)
SODIUM: 138 mmol/L (ref 135–145)

## 2015-11-20 LAB — POCT INR: INR: 3.2

## 2015-11-20 SURGERY — CARDIOVERSION
Anesthesia: General

## 2015-11-20 MED ORDER — LIDOCAINE HCL (CARDIAC) 20 MG/ML IV SOLN
INTRAVENOUS | Status: DC | PRN
  Administered 2015-11-20: 40 mg via INTRATRACHEAL

## 2015-11-20 MED ORDER — PROPOFOL 10 MG/ML IV BOLUS
INTRAVENOUS | Status: DC | PRN
  Administered 2015-11-20: 40 mg via INTRAVENOUS

## 2015-11-20 MED ORDER — SODIUM CHLORIDE 0.9 % IV SOLN
INTRAVENOUS | Status: DC | PRN
  Administered 2015-11-20: 13:00:00 via INTRAVENOUS

## 2015-11-20 NOTE — Procedures (Signed)
Electrical Cardioversion Procedure Note [name] [id number] 17-Apr-1929  Procedure: Electrical Cardioversion Indications:  Atrial Fibrillation.  INR therapeutic today.   Procedure Details Consent: Risks of procedure as well as the alternatives and risks of each were explained to the (patient/caregiver).  Consent for procedure obtained. Time Out: Verified patient identification, verified procedure, site/side was marked, verified correct patient position, special equipment/implants available, medications/allergies/relevent history reviewed, required imaging and test results available.  Performed  Patient placed on cardiac monitor, pulse oximetry, supplemental oxygen as necessary.  Sedation given: Propofol per anesthesiology Pacer pads placed anterior and posterior chest.  Cardioverted 1 time(s).  Cardioverted at [place].  Evaluation Findings: Post procedure EKG shows: NSR Complications: None Patient did tolerate procedure well.  St Jude ICD interrogated after procedure, functioning normally.    [name] 11/20/2015, 1:23 PM

## 2015-11-20 NOTE — Anesthesia Postprocedure Evaluation (Signed)
Anesthesia Post Note  Patient: [name]  Procedure(s) Performed: Procedure(s) (LRB): CARDIOVERSION (N/A)  Patient location during evaluation: Endoscopy Anesthesia Type: General Level of consciousness: awake, patient cooperative and responds to stimulation Pain management: pain level controlled Vital Signs Assessment: post-procedure vital signs reviewed and stable Respiratory status: spontaneous breathing, nonlabored ventilation and respiratory function stable Cardiovascular status: blood pressure returned to baseline and stable Postop Assessment: no headache Anesthetic complications: no    Last Vitals:  Vitals:   11/20/15 1203  BP: 102/70  Pulse: 85  Resp: 15    Last Pain: There were no vitals filed for this visit.               Judeth Cornfield T

## 2015-11-20 NOTE — Transfer of Care (Signed)
Immediate Anesthesia Transfer of Care Note  Patient: [name]  Procedure(s) Performed: Procedure(s): CARDIOVERSION (N/A)  Patient Location: PACU and Endoscopy Unit  Anesthesia Type:General  Level of Consciousness: patient cooperative and responds to stimulation  Airway & Oxygen Therapy: Patient Spontanous Breathing and Patient connected to nasal cannula oxygen  Post-op Assessment: Report given to RN and Post -op Vital signs reviewed and stable  Post vital signs: Reviewed and stable  Last Vitals:  Vitals:   11/20/15 1203  BP: 102/70  Pulse: 85  Resp: 15    Last Pain: There were no vitals filed for this visit.       Complications: No apparent anesthesia complications

## 2015-11-20 NOTE — Interval H&P Note (Signed)
History and Physical Interval Note:  11/20/2015 1:12 PM  [name]  has presented today for surgery, with the diagnosis of afib  The various methods of treatment have been discussed with the patient and family. After consideration of risks, benefits and other options for treatment, the patient has consented to  Procedure(s): CARDIOVERSION (N/A) as a surgical intervention .  The patient's history has been reviewed, patient examined, no change in status, stable for surgery.  I have reviewed the patient's chart and labs.  Questions were answered to the patient's satisfaction.     Cielle Aguila [employer]

## 2015-11-20 NOTE — H&P (View-Only) (Signed)
Primary Care Physician: Garret Reddish, MD Referring Physician: Dr. Gerarda Fraction [name] is a 82 y.o. male with a h/o complete heart block with St Jude CRT-P system,  PAF, nonischemic cardiomyopathy, having failed tikosyn, on amiodarone and has had multiple cardioversion's over the years. His last cardioversion was one week ago and was successful. He felt well thru this past Saturday and then started becoming more short of breath with activity with walking associated with feeling hot, fatigued, with chest tightness that has been present for several days. He called into the HF office a couple of days ago and amiodarone was increased for two days without any change. Yesterday was told to take extra lasix last pm and come to the aifb office today. He does feel better today with less shortness of breath but not back to his baseline, still having  shortness of breath with activity. Does not describe any PND, orthopnea. EKG shows intermittent paced rhythm and interogation of device shows a flutter, onset after cardioversion cannot be determined.   Dr. Aundra Dubin in and examined pt as well and believes that atrial arrhythmia is worsening his HF and  fluid overload  contributing to his symptoms. He would like pt to be diuresed and undergo another DCCV next week.  Today, he denies symptoms of, orthopnea, PND, lower extremity edema, dizziness, presyncope, syncope, or neurologic sequela. Positive for shortness of breath with chest tightness and fatigue. The patient is tolerating medications without difficulties and is otherwise without complaint today.   Past Medical History:  Diagnosis Date  . Arthritis    "back" (03/20/2014)  . Atrial fibrillation ([place])   . BPH (benign prostatic hypertrophy)   . Breast mass    "on both sides" (06/28/2012)  . Cardiomyopathy primary-nonischemic EF 45%  . CHF (congestive heart failure) ([place])   . Coronary artery disease   . Diverticulosis of colon without hemorrhage  01/03/2014  . Dysphagia   . Elevated liver enzymes   . Esophageal stricture   . GERD (gastroesophageal reflux disease)   . Hearing loss, mixed, bilateral   . Hx of cardiovascular stress test    Adenosine Myoview (07/2013):  Apical cap, apical lateral and mid anterolateral scar, small amount of peri-infarct ischemia, EF 36%; Medium Risk  . Hyperlipidemia   . Increased prostate specific antigen (PSA) velocity   . Internal hemorrhoids 01/03/2014  . Lumbar back pain   . Pacemaker    st jude  . Pacemaker infection ([place])    06/28/12  . Polymyalgia rheumatica ([place])   . Sick sinus syndrome [facility])    Past Surgical History:  Procedure Laterality Date  . BREAST SURGERY    . CARDIAC CATHETERIZATION  1980's   [place]  . CARDIOVERSION  06/17/2011   Procedure: CARDIOVERSION;  Surgeon: Lelon Perla, MD;  Location: [place];  Service: Cardiovascular;  Laterality: N/A;  . CARDIOVERSION  09/09/2011   Procedure: CARDIOVERSION;  Surgeon: Carlena Bjornstad, MD;  Location: [place];  Service: Cardiovascular;  Laterality: N/A;  . CARDIOVERSION  01/01/2012   Procedure: CARDIOVERSION;  Surgeon: Hillary Bow, MD;  Location: [facility] ENDOSCOPY;  Service: Cardiovascular;  Laterality: N/A;  . CARDIOVERSION N/A 08/03/2014   Procedure: CARDIOVERSION;  Surgeon: Larey Dresser, MD;  Location: [facility] ENDOSCOPY;  Service: Cardiovascular;  Laterality: N/A;  . CARDIOVERSION N/A 11/07/2015   Procedure: CARDIOVERSION;  Surgeon: Larey Dresser, MD;  Location: [place];  Service: Cardiovascular;  Laterality: N/A;  . CATARACT EXTRACTION, BILATERAL Bilateral 1980's  .  COLONOSCOPY N/A 01/03/2014   Procedure: COLONOSCOPY;  Surgeon: Gatha Mayer, MD;  Location: [facility] ENDOSCOPY;  Service: Endoscopy;  Laterality: N/A;  . ESOPHAGOGASTRODUODENOSCOPY    . GENERATOR REMOVAL Left 06/15/2012   Procedure: GENERATOR REMOVAL;  Surgeon: Evans Lance, MD;  Location: [place];  Service: Cardiovascular;  Laterality: Left;  . INGUINAL HERNIA REPAIR Right  2012; 03/20/2014  . INGUINAL HERNIA REPAIR Right 03/20/2014   Procedure: OPEN REPAIR OF RIGHT INGUINAL WITH MESH;  Surgeon: Donnie Mesa, MD;  Location: [place];  Service: General;  Laterality: Right;  . INSERT / REPLACE / REMOVE PACEMAKER    . INSERTION OF MESH Right 03/20/2014   Procedure: INSERTION OF MESH;  Surgeon: Donnie Mesa, MD;  Location: [place];  Service: General;  Laterality: Right;  . LEAD REVISION N/A 06/29/2012   Procedure: LEAD REVISION;  Surgeon: Deboraha Sprang, MD;  Location: [facility] CATH LAB;  Service: Cardiovascular;  Laterality: N/A;  . PACEMAKER GENERATOR CHANGE N/A 06/15/2012   Procedure: PACEMAKER GENERATOR CHANGE;  Surgeon: Evans Lance, MD;  Location: [place];  Service: Cardiovascular;  Laterality: N/A;  . PACEMAKER REVISION  06/30/2012   Procedure: PACEMAKER LEAD REVISION;  Surgeon: Evans Lance, MD;  Location: [facility] CATH LAB;  Service: Cardiovascular;;  . PARACENTESIS  ~ 0000000   complication from pacer change in 2014  . PERMANENT PACEMAKER INSERTION N/A 06/28/2012   Procedure: PERMANENT PACEMAKER INSERTION;  Surgeon: Evans Lance, MD;  Location: [facility] CATH LAB;  Service: Cardiovascular;  Laterality: N/A;  . TEE WITHOUT CARDIOVERSION  01/01/2012   Procedure: TRANSESOPHAGEAL ECHOCARDIOGRAM (TEE);  Surgeon: Fay Records, MD;  Location: [facility] ENDOSCOPY;  Service: Cardiovascular;  Laterality: N/A;  . TEE WITHOUT CARDIOVERSION N/A 08/03/2014   Procedure: TRANSESOPHAGEAL ECHOCARDIOGRAM (TEE);  Surgeon: Larey Dresser, MD;  Location: [place];  Service: Cardiovascular;  Laterality: N/A;  . TEE WITHOUT CARDIOVERSION N/A 11/07/2015   Procedure: TRANSESOPHAGEAL ECHOCARDIOGRAM (TEE);  Surgeon: Larey Dresser, MD;  Location: [place];  Service: Cardiovascular;  Laterality: N/A;  . TONSILLECTOMY AND ADENOIDECTOMY  1936  . UMBILICAL HERNIA REPAIR  [phone] X 3    Current Outpatient Prescriptions  Medication Sig Dispense Refill  . amiodarone (PACERONE) 200 MG tablet Take 1 tablet (200  mg total) by mouth daily. 90 tablet 3  . carvedilol (COREG) 12.5 MG tablet Take 6.25 mg by mouth 2 (two) times daily with a meal.    . furosemide (LASIX) 40 MG tablet Take 1 tablet (40 mg total) by mouth 2 (two) times daily. 60 tablet 1  . losartan (COZAAR) 25 MG tablet 1/2 tablet (12.5mg  ) by mouth  daily 135 tablet 3  . potassium chloride SA (K-DUR,KLOR-CON) 20 MEQ tablet Take 1 tablet (20 mEq total) by mouth 2 (two) times daily. 60 tablet 1  . pravastatin (PRAVACHOL) 80 MG tablet Take 0.5 tablets (40 mg total) by mouth daily. 45 tablet 3  . spironolactone (ALDACTONE) 25 MG tablet Take 0.5 tablets (12.5 mg total) by mouth every evening. 45 tablet 3  . tamsulosin (FLOMAX) 0.4 MG CAPS capsule Take 1 capsule (0.4 mg total) by mouth daily. 90 capsule 3  . warfarin (COUMADIN) 5 MG tablet TAKE 1/2 TO 1 TABLET (2.5 TO 5 MG TOTAL) DAILY AS DIRECTED BY ANTICOAGULATION CLINIC 90 tablet 3   No current facility-administered medications for this encounter.     Allergies  Allergen Reactions  . Antihistamines, Diphenhydramine-Type Other (See Comments)    Causes difficulty in ability to urinate.  Social History   Social History  . Marital status: Married    Spouse name: N/A  . Number of children: 3  . Years of education: N/A   Occupational History  . retired Retired    Engineering geologist   Social History Main Topics  . Smoking status: Former Smoker    Packs/day: 1.00    Years: 40.00    Types: Cigarettes    Quit date: 03/10/1983  . Smokeless tobacco: Never Used  . 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 Drug use: No  . Sexual activity: Not Currently   Other Topics Concern  . Not on file   Social History Narrative   Married. 3 children (6 together). 5 grandkids with with 5 grandkids (2nd marriage). No greatgrandkids.    One story home.   Retired from [employer]   Education: college   Hobbies: play  golf, bridge, garden, write family stories    Family History  Problem Relation Age of Onset  . Heart attack Father 16    deceased  . AAA (abdominal aortic aneurysm) Mother     deceased AAA  . Hypertension Mother   . Other Brother     deceased at birth  . Healthy Son   . Healthy Daughter   . Diabetes Cousin     ROS- All systems are reviewed and negative except as per the HPI above  Physical Exam: Vitals:   11/15/15 1207  BP: 118/74  Pulse: 79  Weight: 157 lb 9.6 oz (71.5 kg)  Height: 5\' 6"  (1.676 m)    GEN- The patient is well appearing, alert and oriented x 3 today.   Head- normocephalic, atraumatic Eyes-  Sclera clear, conjunctiva pink Ears- hearing intact Oropharynx- clear Neck- supple, no JVP Lymph- no cervical lymphadenopathy Lungs- Clear to ausculation bilaterally, normal work of breathing, mildly diminished breath sounds rt base Heart- irregular rate and rhythm, no murmurs, rubs or gallops, PMI not laterally displaced GI- soft, NT, ND, + BS Extremities- no clubbing, cyanosis, or edema MS- no significant deformity or atrophy Skin- no rash or lesion Psych- euthymic mood, full affect Neuro- strength and sensation are intact  EKG- AV paced with frequent v paced complexes with PAC's.Probable a flutter with v rate of 79 bpm.  Interrogation by Lovey Newcomer, [address] Jude rep, shows aflutter, onset after cardioversion unknown. He is undersensing fib/flutter waves and causing more atrial pacing, Fib/flutter is diminishing BIV pacing.. Reprogrammed to make more sensitive to  fib/flutter waves.  Assessment and Plan: 1. PAF Has returned to symptomatic aflutter after successful cardioversion 8/31 Feels significantly better with improved HF symptoms in SR Will be scheduled for repeat cardioversion 9/13 with Dr. Aundra Dubin Pt had a TEE with cardioversion one week ago,without thrombus, and has had therapeutic INR's on 8/31-2.9, today at 2.7 and per Dr. Aundra Dubin if therapeutic INR the day of the  cardioversion(set up at [place]), he will not require TEE He is to increase Lasix to 40 mg bid and KCL to 20 meq bid  Continue amiodarone at 200 mg a day He will need bmet am prior to cardioversion Bmet, BNP,CBC today CXR today  F/u with Dr. Aundra Dubin in office 9/14  Butch Penny C. Nahjae Hoeg, [facility] [address] [place], [place] [address] [phone]

## 2015-11-20 NOTE — Anesthesia Preprocedure Evaluation (Signed)
Anesthesia Evaluation  Patient identified by MRN, date of birth, ID band Patient awake    Reviewed: Allergy & Precautions, NPO status , reviewed documented beta blocker date and time   Airway Mallampati: I  TM Distance: >3 FB     Dental   Pulmonary shortness of breath, former smoker,    Pulmonary exam normal        Cardiovascular + CAD and +CHF  + pacemaker  Rhythm:Irregular Rate:Abnormal     Neuro/Psych    GI/Hepatic GERD  ,  Endo/Other    Renal/GU      Musculoskeletal  (+) Arthritis ,   Abdominal   Peds  Hematology   Anesthesia Other Findings   Reproductive/Obstetrics                             Anesthesia Physical Anesthesia Plan  ASA: III  Anesthesia Plan: General   Post-op Pain Management:    Induction: Intravenous  Airway Management Planned: Mask  Additional Equipment:   Intra-op Plan:   Post-operative Plan:   Informed Consent: I have reviewed the patients History and Physical, chart, labs and discussed the procedure including the risks, benefits and alternatives for the proposed anesthesia with the patient or authorized representative who has indicated his/her understanding and acceptance.     Plan Discussed with: CRNA, Anesthesiologist and Surgeon  Anesthesia Plan Comments:         Anesthesia Quick Evaluation

## 2015-11-20 NOTE — Discharge Instructions (Signed)
Monitored Anesthesia Care °Monitored anesthesia care is an anesthesia service for a medical procedure. Anesthesia is the loss of the ability to feel pain. It is produced by medicines called anesthetics. It may affect a small area of your body (local anesthesia), a large area of your body (regional anesthesia), or your entire body (general anesthesia). The need for monitored anesthesia care depends your procedure, your condition, and the potential need for regional or general anesthesia. It is often provided during procedures where:  °· General anesthesia may be needed if there are complications. This is because you need special care when you are under general anesthesia.   °· You will be under local or regional anesthesia. This is so that you are able to have higher levels of anesthesia if needed.   °· You will receive calming medicines (sedatives). This is especially the case if sedatives are given to put you in a semi-conscious state of relaxation (deep sedation). This is because the amount of sedative needed to produce this state can be hard to predict. Too much of a sedative can produce general anesthesia. °Monitored anesthesia care is performed by one or more health care providers who have special training in all types of anesthesia. You will need to meet with these health care providers before your procedure. During this meeting, they will ask you about your medical history. They will also give you instructions to follow. (For example, you will need to stop eating and drinking before your procedure. You may also need to stop or change medicines you are taking.) During your procedure, your health care providers will stay with you. They will:  °· Watch your condition. This includes watching your blood pressure, breathing, and level of pain.   °· Diagnose and treat problems that occur.   °· Give medicines if they are needed. These may include calming medicines (sedatives) and anesthetics.   °· Make sure you are  comfortable.   °Having monitored anesthesia care does not necessarily mean that you will be under anesthesia. It does mean that your health care providers will be able to manage anesthesia if you need it or if it occurs. It also means that you will be able to have a different type of anesthesia than you are having if you need it. When your procedure is complete, your health care providers will continue to watch your condition. They will make sure any medicines wear off before you are allowed to go home.  °  °This information is not intended to replace advice given to you by your health care provider. Make sure you discuss any questions you have with your health care provider. °  °Document Released: 11/19/2004 Document Revised: 03/16/2014 Document Reviewed: 04/06/2012 °Elsevier Interactive Patient Education ©2016 [employer] °Electrical Cardioversion, Care After °Refer to this sheet in the next few weeks. These instructions provide you with information on caring for yourself after your procedure. Your health care provider may also give you more specific instructions. Your treatment has been planned according to current medical practices, but problems sometimes occur. Call your health care provider if you have any problems or questions after your procedure. °WHAT TO EXPECT AFTER THE PROCEDURE °After your procedure, it is typical to have the following sensations: °· Some redness on the skin where the shocks were delivered. If this is tender, a sunburn lotion or hydrocortisone cream may help. °· Possible return of an abnormal heart rhythm within hours or days after the procedure. °HOME CARE INSTRUCTIONS °· Take medicines only as directed by your health care provider.   Be sure you understand how and when to take your medicine. °· Learn how to feel your pulse and check it often. °· Limit your activity for 48 hours after the procedure or as directed by your health care provider. °· Avoid or minimize caffeine and other  stimulants as directed by your health care provider. °SEEK MEDICAL CARE IF: °· You feel like your heart is beating too fast or your pulse is not regular. °· You have any questions about your medicines. °· You have bleeding that will not stop. °SEEK IMMEDIATE MEDICAL CARE IF: °· You are dizzy or feel faint. °· It is hard to breathe or you feel short of breath. °· There is a change in discomfort in your chest. °· Your speech is slurred or you have trouble moving an arm or leg on one side of your body. °· You get a serious muscle cramp that does not go away. °· Your fingers or toes turn cold or blue. °  °This information is not intended to replace advice given to you by your health care provider. Make sure you discuss any questions you have with your health care provider. °  °Document Released: 12/14/2012 Document Revised: 03/16/2014 Document Reviewed: 12/14/2012 °Elsevier Interactive Patient Education ©2016 [employer] ° °

## 2015-11-21 ENCOUNTER — Encounter (HOSPITAL_COMMUNITY): Payer: Medicare Other

## 2015-11-21 ENCOUNTER — Encounter (HOSPITAL_COMMUNITY): Payer: Self-pay | Admitting: Cardiology

## 2015-11-22 ENCOUNTER — Encounter: Payer: Self-pay | Admitting: Cardiology

## 2015-11-22 LAB — CUP PACEART REMOTE DEVICE CHECK
Battery Voltage: 2.96 V
Brady Statistic AP VP Percent: 92 %
Brady Statistic AP VS Percent: 5.8 %
Brady Statistic AS VP Percent: 1 %
Brady Statistic RA Percent Paced: 97 %
Implantable Lead Implant Date: 20140422
Implantable Lead Implant Date: 20140424
Implantable Lead Location: 753858
Implantable Lead Location: 753859
Lead Channel Impedance Value: 430 Ohm
Lead Channel Impedance Value: 730 Ohm
Lead Channel Pacing Threshold Amplitude: 0.75 V
Lead Channel Pacing Threshold Pulse Width: 0.5 ms
Lead Channel Sensing Intrinsic Amplitude: 7.3 mV
Lead Channel Setting Pacing Amplitude: 2 V
Lead Channel Setting Pacing Amplitude: 2 V
Lead Channel Setting Pacing Pulse Width: 0.5 ms
MDC IDC LEAD IMPLANT DT: 20140424
MDC IDC LEAD LOCATION: 753860
MDC IDC MSMT BATTERY REMAINING LONGEVITY: 82 mo
MDC IDC MSMT BATTERY REMAINING PERCENTAGE: 95.5 %
MDC IDC MSMT LEADCHNL LV PACING THRESHOLD AMPLITUDE: 1 V
MDC IDC MSMT LEADCHNL LV PACING THRESHOLD PULSEWIDTH: 0.5 ms
MDC IDC MSMT LEADCHNL RA IMPEDANCE VALUE: 440 Ohm
MDC IDC MSMT LEADCHNL RA SENSING INTR AMPL: 0.5 mV
MDC IDC MSMT LEADCHNL RV PACING THRESHOLD AMPLITUDE: 0.75 V
MDC IDC MSMT LEADCHNL RV PACING THRESHOLD PULSEWIDTH: 0.4 ms
MDC IDC SESS DTM: 20170830162828
MDC IDC SET LEADCHNL RV PACING AMPLITUDE: 2.5 V
MDC IDC SET LEADCHNL RV PACING PULSEWIDTH: 0.4 ms
MDC IDC SET LEADCHNL RV SENSING SENSITIVITY: 2.5 mV
MDC IDC STAT BRADY AS VS PERCENT: 1 %
Pulse Gen Model: 3210
Pulse Gen Serial Number: 2926516

## 2015-11-26 ENCOUNTER — Encounter (HOSPITAL_COMMUNITY): Payer: Self-pay

## 2015-11-26 ENCOUNTER — Ambulatory Visit (HOSPITAL_COMMUNITY)
Admission: RE | Admit: 2015-11-26 | Discharge: 2015-11-26 | Disposition: A | Discharge status: Home / Self Care | Payer: Medicare Other | Source: Ambulatory Visit | Attending: Cardiology | Admitting: Cardiology

## 2015-11-26 VITALS — BP 96/60 | HR 70 | Wt 149.5 lb

## 2015-11-26 DIAGNOSIS — I2510 Atherosclerotic heart disease of native coronary artery without angina pectoris: Secondary | ICD-10-CM | POA: Diagnosis not present

## 2015-11-26 DIAGNOSIS — I429 Cardiomyopathy, unspecified: Secondary | ICD-10-CM | POA: Diagnosis not present

## 2015-11-26 DIAGNOSIS — I480 Paroxysmal atrial fibrillation: Secondary | ICD-10-CM | POA: Diagnosis not present

## 2015-11-26 DIAGNOSIS — Z79899 Other long term (current) drug therapy: Secondary | ICD-10-CM | POA: Diagnosis not present

## 2015-11-26 DIAGNOSIS — E785 Hyperlipidemia, unspecified: Secondary | ICD-10-CM | POA: Insufficient documentation

## 2015-11-26 DIAGNOSIS — Z9889 Other specified postprocedural states: Secondary | ICD-10-CM | POA: Insufficient documentation

## 2015-11-26 DIAGNOSIS — I442 Atrioventricular block, complete: Secondary | ICD-10-CM | POA: Diagnosis not present

## 2015-11-26 DIAGNOSIS — N400 Enlarged prostate without lower urinary tract symptoms: Secondary | ICD-10-CM | POA: Diagnosis not present

## 2015-11-26 DIAGNOSIS — I5022 Chronic systolic (congestive) heart failure: Secondary | ICD-10-CM

## 2015-11-26 DIAGNOSIS — M353 Polymyalgia rheumatica: Secondary | ICD-10-CM | POA: Diagnosis not present

## 2015-11-26 DIAGNOSIS — I4891 Unspecified atrial fibrillation: Secondary | ICD-10-CM | POA: Diagnosis not present

## 2015-11-26 DIAGNOSIS — Z7901 Long term (current) use of anticoagulants: Secondary | ICD-10-CM | POA: Insufficient documentation

## 2015-11-26 DIAGNOSIS — K219 Gastro-esophageal reflux disease without esophagitis: Secondary | ICD-10-CM | POA: Diagnosis not present

## 2015-11-26 DIAGNOSIS — Z87891 Personal history of nicotine dependence: Secondary | ICD-10-CM | POA: Insufficient documentation

## 2015-11-26 LAB — COMPREHENSIVE METABOLIC PANEL
ALBUMIN: 3.8 g/dL (ref 3.5–5.0)
ALK PHOS: 60 U/L (ref 38–126)
ALT: 27 U/L (ref 17–63)
AST: 32 U/L (ref 15–41)
Anion gap: 7 (ref 5–15)
BILIRUBIN TOTAL: 1.4 mg/dL — AB (ref 0.3–1.2)
BUN: 25 mg/dL — AB (ref 6–20)
CALCIUM: 9.2 mg/dL (ref 8.9–10.3)
CO2: 25 mmol/L (ref 22–32)
Chloride: 105 mmol/L (ref 101–111)
Creatinine, Ser: 1.31 mg/dL — ABNORMAL HIGH (ref 0.61–1.24)
GFR calc Af Amer: 56 mL/min — ABNORMAL LOW (ref >60)
GFR calc non Af Amer: 48 mL/min — ABNORMAL LOW (ref >60)
GLUCOSE: 104 mg/dL — AB (ref 65–99)
POTASSIUM: 4.1 mmol/L (ref 3.5–5.1)
Sodium: 137 mmol/L (ref 135–145)
TOTAL PROTEIN: 6.4 g/dL — AB (ref 6.5–8.1)

## 2015-11-26 LAB — BRAIN NATRIURETIC PEPTIDE: B Natriuretic Peptide: 639 pg/mL — ABNORMAL HIGH (ref 0.0–100.0)

## 2015-11-26 NOTE — Patient Instructions (Signed)
Routine lab work today. Will notify you of abnormal results  Your provider requests see Dr.Taylor before you next follow up appointment.  Follow up with Dr.McLean in 6 weeks.

## 2015-11-26 NOTE — Progress Notes (Signed)
Patient ID: [name], male   DOB: December 23, 1929, 82 y.o.   MRN: [id number] PCP: Dr. Yong [name] Cardiology: Dr. Aundra [name]  82 yo with history of paroxysmal atrial fibrillation, nonischemic cardiomyopathy, and complete heart block with St Jude CRT-P system presents for cardiology followup.  He was admitted in 4/14 with pacemaker pocket infection.  His pacemaker was removed and temporary-permanent device was placed.  Later, he had the temporary-permanent device removed and a new CRT-P device was placed. He developed dyspnea post-operatively and was found to have a large right-sided pleural effusion.  He was admitted and got a chest tube for drainage. He had followup with Dr. Servando [name] and it was decided that he would not need VATS.  He had been on amiodarone for maintenance of NSR.  This had been more successful than Tikosyn.   Around 3/15, he started developing increasing exertional dyspnea.  He was short of breath walking up a hill or incline.  He could still walk on the treadmill for exercise for 10-15 minutes and use the elliptical without much trouble on most days.  No orthopnea or PND.  No chest pain.  He saw Dr [name] and was started on Lasix three times a week due to concern for volume overload.  This did not seem to have helped much.  After that, he had PFTs done which showed normal spirometry but DLCO 50% predicted.  Therefore, amiodarone was stopped due to concern for lung toxicity.  He saw [name] for a pre-operative evaluation prior to right inguinal hernia repair.  Given the exertional dyspnea, he was set up for adenosine Cardiolite in 5/15.  This showed EF 36% with a small area of primarily scar in the apex, apical lateral wall, and mid anterolateral wall.  He has not felt palpitations. Echo in 5/15 showed EF 40-45%, similar to prior study.   I had him see pulmonary for evaluation for amiodarone lung toxicity.  CT chest looked ok, and he was not thought to have amiodarone lung toxicity.   At a  prior appointment in 2016, [name] was noted to be back in atrial fibrillation.  He was more short of breath with exertion and fatigued.  He has historically tolerated atrial fibrillation poorly.  I did a TEE-guided DCCV in 5/16 back to NSR.  TEE showed EF 35-40%.  He did not immediately feel better like he has in the past post-DCCV, so Lasix was increased.    Recurrent symptomatic atrial fibrillation in 8/17 with TEE-guided DCCV to NSR.  TEE showed EF 25-30%.  Recurrent symptomatic atrial fibrillation with CHF exacerbation in 9/17.  He had repeat DCCV and Lasix was increased.   Today, he is back in atrial fibrillation that is rate-controlled.  Weight is down 10 lbs.  He is feeling much better compared to last appointment even though cardioversion did not hold.  No dyspnea except with heavy exertion.  Back to baseline.  No orthopnea/PND. No lightheadedness or chest pain.     [id number] fibrillation, BiV paced  Labs (7/13); LDL 65, HDL 38 Labs (3/14): TSH normal Labs (4/14):  ALT 61, AST 62 Labs (5/14): K 4.5, creatinine 0.9, BNP 727, digoxin 0.9 Labs (6/14): LDL 77, HDL 34, K 4, creatinine 0.9, TSH normal Labs (10/14): digoxin 2.1, K 4.7, creatinine 1.0, TSH normal, LFTs normal Labs (3/15): K 4, creatinine 1.1 Labs (4/15): BNP 482 Labs (5/15): K 3.8, creatinine 1.2 Labs (6/15): K 4.3, creatinine 0.9 Labs (7/15): TSH normal, LFTs normal Labs (9/15): TSH normal, LFTs  normal Labs (1/16): K 4.3, creatinine 1.07 Labs (5/16): TSH normal, LFTs normal, K 4.4, creatinine 1.02 Labs (6/16): K 4.4, creatinine 1.29 Labs (9/16): LDL 92, LFTs normal Labs (10/16): K 4.5, creatinine 1.07 Labs (3/17): TSH normal Labs (9/17): K 3.4, creatinine 1.32, BNP 837  PMH: 1. Low back pain 2. Hyperlipidemia 3. GERD 4. BPH 5. Polymyalgia rheumatica 6. Atrial fibrillation: Failed Tikosyn in the past.  Paroxysmal, on amiodarone and warfarin.  DCCV in 4/13, 7/13, 10/13.  Has held NSR with amiodarone.  However,  concern for amiodarone lung toxicity: PFTs (4/15) with FVC 90% predicted, FEV1 90%, ratio 97%, DLCO 50%.   Recurrence of atrial fibrillation in 5/16, TEE-guided DCCV back to NSR in 5/16.  - Recurrence of atrial fibrillation in 8/17. TEE-guided DCCV 11/07/15.  - Recurrence of atrial fibrillation 9/17.  DCCV 11/20/15 but back in atrial fibrillation by 11/26/15.  7. Transaminitis: Mild, attributed to amiodarone.  8. Complete heart block s/p St Jude CRT-P device.  Patient developed PCM pocket infection in 4/14 and had his first system removed with placement of a temporary permanent PCM.  He later had CRT-P device replaced.  This was complicated by large right-sided hemorrhagic pleural effusion requiring chest tube.   9. Nonischemic cardiomyopathy: Echo (9/13) with EF 40-45%, diffuse HK, mild Craig.  Mild CAD only on prior LHC.  Echo (4/14) with EF 45-50%.  Adenosine Cardiolite (5/15) with EF 36% (visually appeared higher per report), small area of scar with peri-infarct ischemia in the apex, apical lateral wall, and mid anterolateral wall. Echo (5/15) with EF 40-45%, basal to mid inferolateral severe hypokinesis, basal to mid anterolateral hypokinesis, mildly dilated RV with mildly decreased RV systolic function, mild to moderate Craig.  TEE (5/16) with EF 35-40%, inferior and inferolateral severe hypokinesis, normal RV size with mildly decreased systolic function, moderate Craig, peak RV-RA gradient 26 mmHg.  - TEE (8/17): EF 25-30%, mildly dilated RV with mildly decreased systolic function, moderate biatrial enlargement, moderate central Craig.  10. Large right pleural effusion requiring chest tube following removal and reimplantation of PCM. Most recent chest CT in 6/15 showed significant decrease in size of loculated pleural effusion since 5/14.    SH: Married, prior smoker (quit 1985), no ETOH x years, retired, lives at [employer]  [place]: Father with MI at 3, mother with AAA.   ROS: All systems reviewed and negative  except as per HPI.   Current Outpatient Prescriptions  Medication Sig Dispense Refill  . amiodarone (PACERONE) 200 MG tablet Take 1 tablet (200 mg total) by mouth daily. 90 tablet 3  . carvedilol (COREG) 12.5 MG tablet Take 6.25 mg by mouth 2 (two) times daily with a meal.    . furosemide (LASIX) 40 MG tablet Take 1 tablet (40 mg total) by mouth 2 (two) times daily. 180 tablet 2  . losartan (COZAAR) 25 MG tablet 1/2 tablet (12.5mg  ) by mouth  daily 135 tablet 3  . potassium chloride SA (K-DUR,KLOR-CON) 20 MEQ tablet Take 1 tablet (20 mEq total) by mouth 2 (two) times daily. 180 tablet 2  . pravastatin (PRAVACHOL) 80 MG tablet Take 0.5 tablets (40 mg total) by mouth daily. 45 tablet 3  . spironolactone (ALDACTONE) 25 MG tablet Take 0.5 tablets (12.5 mg total) by mouth every evening. 45 tablet 3  . tamsulosin (FLOMAX) 0.4 MG CAPS capsule Take 1 capsule (0.4 mg total) by mouth daily. 90 capsule 3  . warfarin (COUMADIN) 5 MG tablet TAKE 1/2 TO 1 TABLET (2.5 TO 5  MG TOTAL) DAILY AS DIRECTED BY ANTICOAGULATION CLINIC 90 tablet 3   No current facility-administered medications for this encounter.     BP 96/60   Pulse 70   Wt 149 lb 8 oz (67.8 kg)   SpO2 97%   BMI 24.13 kg/m  General: NAD Neck: JVP 7 cm, no thyromegaly or thyroid nodule.  Lungs: CTAB  CV: Nondisplaced PMI.  Heart irregular S1/S2, no S3/S4, no murmur.  No edema.  No carotid bruit.  Normal pedal pulses.  Abdomen: Soft, nontender, no hepatosplenomegaly, no distention.  Neurologic: Alert and oriented x 3.  Psych: Normal affect. Extremities: No clubbing or cyanosis.   Assessment/Plan: 1. Chronic systolic CHF:  Presumed nonischemic cardiomyopathy (prior cath with only mild CAD).  He had an adenosine Cardiolite in 5/15 with EF 36% and possible small area of apical and lateral scar with peri-infarct ischemia. He has St Jude CRT-P.  TEE in 8/17 showed EF 25-30% . Symptomatically, he is doing much better on higher Lasix dose.  Suspect  atrial fibrillation triggers CHF and will lead to higher Lasix requirement.  See below, I am not sure we will be able to keep him out of atrial fibrillation. NYHA class II symptoms, not volume overloaded today.  - Continue Lasix 40 mg bid.  - Continue current Coreg, losartan, and spironolactone.  No BP room for titration.  - Check BMET/BNP today.  2. Atrial fibrillation: Now persistent.  Had breakthrough with Tikosyn and now not able to hold NSR on amiodarone. Continue amiodarone for the time being and will have him see Dr Lovena Le to discuss any other options for rhythm control.  However, I suspect he is not a great ablation candidate and is not symptomatic enough at this point for ablation/pacing.   - If atrial fibrillation remains persistent, will likely stop amiodarone at followup appt. - Given amiodarone use, check LFTs and TSH, will get yearly eye exam.   - Continue warfarin.  3. Hyperlipidemia: History of nonobstructive CAD.  Continue pravastatin.    Followup in 6 wks.  If still in atrial fibrillation, will likely stop amiodarone.    [name] 11/26/2015

## 2015-11-27 ENCOUNTER — Encounter: Payer: Self-pay | Admitting: Internal Medicine

## 2015-11-29 ENCOUNTER — Ambulatory Visit (INDEPENDENT_AMBULATORY_CARE_PROVIDER_SITE_OTHER): Payer: Medicare Other | Admitting: *Deleted

## 2015-11-29 DIAGNOSIS — I4891 Unspecified atrial fibrillation: Secondary | ICD-10-CM | POA: Diagnosis not present

## 2015-11-29 DIAGNOSIS — Z5181 Encounter for therapeutic drug level monitoring: Secondary | ICD-10-CM | POA: Diagnosis not present

## 2015-11-29 LAB — POCT INR: INR: 2.5

## 2015-12-05 ENCOUNTER — Ambulatory Visit (INDEPENDENT_AMBULATORY_CARE_PROVIDER_SITE_OTHER): Payer: Medicare Other | Admitting: Family Medicine

## 2015-12-05 DIAGNOSIS — Z23 Encounter for immunization: Secondary | ICD-10-CM | POA: Diagnosis not present

## 2015-12-10 ENCOUNTER — Encounter: Payer: Self-pay | Admitting: Internal Medicine

## 2015-12-10 ENCOUNTER — Encounter (HOSPITAL_COMMUNITY): Payer: Medicare Other

## 2015-12-11 ENCOUNTER — Ambulatory Visit (INDEPENDENT_AMBULATORY_CARE_PROVIDER_SITE_OTHER): Payer: Medicare Other | Admitting: Internal Medicine

## 2015-12-11 ENCOUNTER — Encounter: Payer: Self-pay | Admitting: Internal Medicine

## 2015-12-11 ENCOUNTER — Ambulatory Visit (INDEPENDENT_AMBULATORY_CARE_PROVIDER_SITE_OTHER): Payer: Medicare Other | Admitting: Pharmacist

## 2015-12-11 VITALS — BP 90/50 | HR 76 | Ht 66.0 in | Wt 153.0 lb

## 2015-12-11 DIAGNOSIS — I5022 Chronic systolic (congestive) heart failure: Secondary | ICD-10-CM | POA: Diagnosis not present

## 2015-12-11 DIAGNOSIS — Z5181 Encounter for therapeutic drug level monitoring: Secondary | ICD-10-CM

## 2015-12-11 DIAGNOSIS — Z950 Presence of cardiac pacemaker: Secondary | ICD-10-CM

## 2015-12-11 DIAGNOSIS — I4891 Unspecified atrial fibrillation: Secondary | ICD-10-CM

## 2015-12-11 LAB — CUP PACEART INCLINIC DEVICE CHECK
Battery Remaining Longevity: 80.4
Battery Voltage: 2.96 V
Date Time Interrogation Session: 20171004175653
Implantable Lead Implant Date: 20140424
Implantable Lead Location: 753860
Implantable Lead Model: 5076
Lead Channel Impedance Value: 475 Ohm
Lead Channel Pacing Threshold Amplitude: 1 V
Lead Channel Pacing Threshold Pulse Width: 0.5 ms
Lead Channel Sensing Intrinsic Amplitude: 0.7 mV
Lead Channel Setting Pacing Amplitude: 2 V
Lead Channel Setting Pacing Amplitude: 2.5 V
Lead Channel Setting Pacing Pulse Width: 0.5 ms
MDC IDC LEAD IMPLANT DT: 20140422
MDC IDC LEAD IMPLANT DT: 20140424
MDC IDC LEAD LOCATION: 753858
MDC IDC LEAD LOCATION: 753859
MDC IDC MSMT LEADCHNL LV IMPEDANCE VALUE: 762.5 Ohm
MDC IDC MSMT LEADCHNL RV IMPEDANCE VALUE: 437.5 Ohm
MDC IDC MSMT LEADCHNL RV PACING THRESHOLD AMPLITUDE: 1 V
MDC IDC MSMT LEADCHNL RV PACING THRESHOLD PULSEWIDTH: 0.4 ms
MDC IDC MSMT LEADCHNL RV SENSING INTR AMPL: 7.6 mV
MDC IDC PG SERIAL: 2926516
MDC IDC SET LEADCHNL RA PACING AMPLITUDE: 2 V
MDC IDC SET LEADCHNL RV PACING PULSEWIDTH: 0.4 ms
MDC IDC SET LEADCHNL RV SENSING SENSITIVITY: 2.5 mV
MDC IDC STAT BRADY RA PERCENT PACED: 90 %
MDC IDC STAT BRADY RV PERCENT PACED: 90 %

## 2015-12-11 LAB — POCT INR: INR: 2.1

## 2015-12-11 MED ORDER — METOPROLOL TARTRATE 50 MG PO TABS: ORAL_TABLET | ORAL | 0 refills | Status: DC

## 2015-12-11 NOTE — Progress Notes (Signed)
HPI [name] returns today for followup. He is a very pleasant 82 year old man with chronic systolic heart failure, symptomatic bradycardia, high-grade conduction system disease, who developed a pacemaker pocket infection and underwent extraction, initial insertion of a temporary permanent pacemaker, followed by insertion of a biventricular pacemaker. His procedure was complicated by atrial lead perforation x2, resulting in a pleural effusion.  No syncope or palpitations. He underwent DCCV several weeks ago twice. He has reverted back to atrial fib. He has weakness and fatigue. Feels like his heart is racing some.  No syncope.  Allergies  Allergen Reactions  . Antihistamines, Diphenhydramine-Type Other (See Comments)    Causes difficulty in ability to urinate.     Current Outpatient Prescriptions  Medication Sig Dispense Refill  . carvedilol (COREG) 12.5 MG tablet Take 6.25 mg by mouth 2 (two) times daily with a meal.    . furosemide (LASIX) 40 MG tablet Take 1 tablet (40 mg total) by mouth 2 (two) times daily. 180 tablet 2  . losartan (COZAAR) 25 MG tablet 1/2 tablet (12.5mg  ) by mouth  daily 135 tablet 3  . potassium chloride SA (K-DUR,KLOR-CON) 20 MEQ tablet Take 1 tablet (20 mEq total) by mouth 2 (two) times daily. 180 tablet 2  . pravastatin (PRAVACHOL) 80 MG tablet Take 0.5 tablets (40 mg total) by mouth daily. 45 tablet 3  . spironolactone (ALDACTONE) 25 MG tablet Take 0.5 tablets (12.5 mg total) by mouth every evening. 45 tablet 3  . tamsulosin (FLOMAX) 0.4 MG CAPS capsule Take 1 capsule (0.4 mg total) by mouth daily. 90 capsule 3  . warfarin (COUMADIN) 5 MG tablet TAKE 1/2 TO 1 TABLET (2.5 TO 5 MG TOTAL) DAILY AS DIRECTED BY ANTICOAGULATION CLINIC 90 tablet 3  . metoprolol (LOPRESSOR) 50 MG tablet Take 1/2 tablet (25 mg) twice a day for 1 week. After that, take 1 tablet (50 mg) twice a day. 50 tablet 0   No current facility-administered medications for this visit.      Past  Medical History:  Diagnosis Date  . Arthritis    "back" (03/20/2014)  . Atrial fibrillation ([place])   . BPH (benign prostatic hypertrophy)   . Breast mass    "on both sides" (06/28/2012)  . Cardiomyopathy primary-nonischemic EF 45%  . CHF (congestive heart failure) ([place])   . Coronary artery disease   . Diverticulosis of colon without hemorrhage 01/03/2014  . Dysphagia   . Elevated liver enzymes   . Esophageal stricture   . GERD (gastroesophageal reflux disease)   . Hearing loss, mixed, bilateral   . Hx of cardiovascular stress test    Adenosine Myoview (07/2013):  Apical cap, apical lateral and mid anterolateral scar, small amount of peri-infarct ischemia, EF 36%; Medium Risk  . Hyperlipidemia   . Increased prostate specific antigen (PSA) velocity   . Internal hemorrhoids 01/03/2014  . Lumbar back pain   . Pacemaker    st jude  . Pacemaker infection ([place])    06/28/12  . Polymyalgia rheumatica ([place])   . Sick sinus syndrome ([facility])     ROS:   All systems reviewed and negative except as noted in the HPI.   Past Surgical History:  Procedure Laterality Date  . BREAST SURGERY    . CARDIAC CATHETERIZATION  1980's   [place]  . CARDIOVERSION  06/17/2011   Procedure: CARDIOVERSION;  Surgeon: Lelon Perla, MD;  Location: [place];  Service: Cardiovascular;  Laterality: N/A;  . CARDIOVERSION  09/09/2011   Procedure: CARDIOVERSION;  Surgeon: Carlena Bjornstad, MD;  Location: [place];  Service: Cardiovascular;  Laterality: N/A;  . CARDIOVERSION  01/01/2012   Procedure: CARDIOVERSION;  Surgeon: Hillary Bow, MD;  Location[facility] ENDOSCOPY;  Service: Cardiovascular;  Laterality: N/A;  . CARDIOVERSION N/A 08/03/2014   Procedure: CARDIOVERSION;  Surgeon: Larey Dresser, MD;  Location: [facility] ENDOSCOPY;  Service: Cardiovascular;  Laterality: N/A;  . CARDIOVERSION N/A 11/07/2015   Procedure: CARDIOVERSION;  Surgeon: Larey Dresser, MD;  Location: [facility] ENDOSCOPY;  Service: Cardiovascular;  Laterality: N/A;   . CARDIOVERSION N/A 11/20/2015   Procedure: CARDIOVERSION;  Surgeon: Larey Dresser, MD;  Location: [place];  Service: Cardiovascular;  Laterality: N/A;  . CATARACT EXTRACTION, BILATERAL Bilateral 1980's  . COLONOSCOPY N/A 01/03/2014   Procedure: COLONOSCOPY;  Surgeon: Gatha Mayer, MD;  Location: [facility] ENDOSCOPY;  Service: Endoscopy;  Laterality: N/A;  . ESOPHAGOGASTRODUODENOSCOPY    . GENERATOR REMOVAL Left 06/15/2012   Procedure: GENERATOR REMOVAL;  Surgeon: Evans Lance, MD;  Location: [place];  Service: Cardiovascular;  Laterality: Left;  . INGUINAL HERNIA REPAIR Right 2012; 03/20/2014  . INGUINAL HERNIA REPAIR Right 03/20/2014   Procedure: OPEN REPAIR OF RIGHT INGUINAL WITH MESH;  Surgeon: Donnie Mesa, MD;  Location: [place];  Service: General;  Laterality: Right;  . INSERT / REPLACE / REMOVE PACEMAKER    . INSERTION OF MESH Right 03/20/2014   Procedure: INSERTION OF MESH;  Surgeon: Donnie Mesa, MD;  Location: [place];  Service: General;  Laterality: Right;  . LEAD REVISION N/A 06/29/2012   Procedure: LEAD REVISION;  Surgeon: Deboraha Sprang, MD;  Location: [facility] CATH LAB;  Service: Cardiovascular;  Laterality: N/A;  . PACEMAKER GENERATOR CHANGE N/A 06/15/2012   Procedure: PACEMAKER GENERATOR CHANGE;  Surgeon: Evans Lance, MD;  Location: [place];  Service: Cardiovascular;  Laterality: N/A;  . PACEMAKER REVISION  06/30/2012   Procedure: PACEMAKER LEAD REVISION;  Surgeon: Evans Lance, MD;  Location: [facility] CATH LAB;  Service: Cardiovascular;;  . PARACENTESIS  ~ 0000000   complication from pacer change in 2014  . PERMANENT PACEMAKER INSERTION N/A 06/28/2012   Procedure: PERMANENT PACEMAKER INSERTION;  Surgeon: Evans Lance, MD;  Location: [facility] CATH LAB;  Service: Cardiovascular;  Laterality: N/A;  . TEE WITHOUT CARDIOVERSION  01/01/2012   Procedure: TRANSESOPHAGEAL ECHOCARDIOGRAM (TEE);  Surgeon: Fay Records, MD;  Location: [facility] ENDOSCOPY;  Service: Cardiovascular;  Laterality: N/A;  . TEE WITHOUT  CARDIOVERSION N/A 08/03/2014   Procedure: TRANSESOPHAGEAL ECHOCARDIOGRAM (TEE);  Surgeon: Larey Dresser, MD;  Location: [place];  Service: Cardiovascular;  Laterality: N/A;  . TEE WITHOUT CARDIOVERSION N/A 11/07/2015   Procedure: TRANSESOPHAGEAL ECHOCARDIOGRAM (TEE);  Surgeon: Larey Dresser, MD;  Location: [place];  Service: Cardiovascular;  Laterality: N/A;  . TONSILLECTOMY AND ADENOIDECTOMY  1938  . UMBILICAL HERNIA REPAIR  [phone] X 3     Family History  Problem Relation Age of Onset  . Heart attack Father 40    deceased  . AAA (abdominal aortic aneurysm) Mother     deceased AAA  . Hypertension Mother   . Other Brother     deceased at birth  . Healthy Son   . Healthy Daughter   . Diabetes Cousin      Social History   Social History  . Marital status: Married    Spouse name: N/A  . Number of children: 3  . Years of education: N/A   Occupational History  . 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 Smoking status: Former Smoker    Packs/day: 1.00    Years: 40.00    Types: Cigarettes    Quit date: 03/10/1983  . Smokeless tobacco: Never Used  . 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 Drug use: No  . Sexual activity: Not Currently   Other Topics Concern  . Not on file   Social History Narrative   Married. 3 children (6 together). 5 grandkids with with 5 grandkids (2nd marriage). No greatgrandkids.    One story home.   Retired from Education officer, community: college   Hobbies: play golf, bridge, garden, write family stories     BP (!) 90/50   Pulse 76   Ht 5\' 6"  (1.676 m)   Wt 153 lb (69.4 kg)   BMI 24.69 kg/m   Physical Exam:  stable appearing elderly man, NAD HEENT: Unremarkable Neck:  7 cm JVD, no thyromegally Back:  No CVA tenderness Lungs:  Clear with no wheezes, rales, or rhonchi. Well-healed pacemaker incision. Well-healed  extraction incision. HEART:  IRegular rate rhythm, no murmurs, no rubs, no clicks Abd:  soft, positive bowel sounds, no organomegally, no rebound, no guarding Ext:  2 plus pulses, trace edema, no cyanosis, no clubbing Skin:  No rashes no nodules Neuro:  CN II through XII intact, motor grossly intact   DEVICE  Normal device function. See PaceArt for details.   Assess/Plan: 1. Atrial fib - he continues with atrial fib and is sympomatic. I have discussed the treatment options with the patient in detail. Rate control versus rhythm control have been reviewed. After an extensive discussion with the patient, he would like to pursue rate control. We will discontinue amiodarone, up titrate his metoprolol, and see how he does. If rate control does not work, then we would consider reinitiation of amiodarone or possibly dofetilide. Alternatively, AV node ablation is also a consideration. 2. Chronic systolic and diastolic heart failure - his symptoms are worse in atrial fibrillation, approaching class III. Hopefully he can be controlled from a rate control perspective and improve his heart failure. He will continue his current medications and maintain a low-sodium diet. 3. Pacemaker - his St. Jude biventricular pacemaker is working normally. We'll plan to recheck in several months. 4.  Coronary artery disease - he denies anginal symptoms. He will continue his current medical therapy.  Cristopher Peru, M.D.

## 2015-12-11 NOTE — Patient Instructions (Addendum)
Medication Instructions:  Your physician has recommended you make the following change in your medication:   1) START Metoprolol 25 mg twice a day for 1 week    After 1 week, INCREASE to 50 mg twice a day  1) STOP Amiodarone    Labwork: None Ordered     Testing/Procedures: None Ordered     Follow-Up: Your physician wants you to follow-up in: 1 year with Dr. Lovena Le.  You will receive a reminder letter in the mail two months in advance. If you don't receive a letter, please call our office to schedule the follow-up appointment.  Remote monitoring is used to monitor your Pacemaker from home. This monitoring reduces the number of office visits required to check your device to one time per year. It allows [place] to keep an eye on the functioning of your device to ensure it is working properly. You are scheduled for a device check from home on 03/12/15. You may send your transmission at any time that day. If you have a wireless device, the transmission will be sent automatically. After your physician reviews your transmission, you will receive a postcard with your next transmission date.    Any Other Special Instructions Will Be Listed Below (If Applicable).     If you need a refill on your cardiac medications before your next appointment, please call your pharmacy.

## 2015-12-13 ENCOUNTER — Other Ambulatory Visit (HOSPITAL_COMMUNITY): Payer: Self-pay | Admitting: *Deleted

## 2015-12-16 ENCOUNTER — Telehealth: Payer: Self-pay | Admitting: *Deleted

## 2015-12-17 NOTE — Telephone Encounter (Signed)
ATENA NEED CLARIFICATION ON CARVEDILOL & METOPROLOL USAGE AS IT CREATES DUPLICATE THERAPY, PLEASE CALL & CLARIFY IF ON BOTH OR WHICH ONE THEY ARE ON, THANKS

## 2015-12-18 NOTE — Telephone Encounter (Signed)
Called pt to discuss medications Carvedilol and Metoprolol. Pt stated he is currently taking both. Pt stated resting HR today was 87, BP has been "fine". Pt stated low BP is normal for him.  He doesn't check it on a regular basis. HR when walking 100's-120's and SOB upon exertion (which he was having at last appt on 12/11/15). Will forward to Dr. Lovena Le to advise.

## 2015-12-18 NOTE — Telephone Encounter (Signed)
Called pt. Informed pt to Stop Carvedilol and do not start Metoprolol 50 mg twice a day until Friday (12/18/15). Pt verbalized understanding and agreed with plan.

## 2015-12-18 NOTE — Telephone Encounter (Signed)
ATENA NEED CLARIFICATION ON CARVEDILOL & METOPROLOL USAGE AS IT CREATES DUPLICATE THERAPY, PLEASE CALL & CLARIFY IF ON BOTH OR WHICH ONE THEY ARE ON, THANKS

## 2015-12-19 NOTE — Addendum Note (Signed)
Addended by: Briant Cedar R on: 12/19/2015 12:02 PM   Modules accepted: Orders

## 2015-12-21 ENCOUNTER — Telehealth: Payer: Self-pay | Admitting: Cardiovascular Disease

## 2015-12-21 NOTE — Telephone Encounter (Signed)
[name] called to state that he is going in and out of atrial fibrillation with ventricular rates 130-140bpm, and with this he is SOB at rest and significantly dyspneic with minimal exertion.  I could hear his increased WOB over the phone.  I strongly suggested that he come to the ED for evaluation but the patient does not wish to do so.  I tried several times to convince him that an ED visit for rate control and possible IV diuresis is indicated, but he does not wish to come in at this time.  He also asked about discontinuing his metoprolol for the weekend, and again I strongly recommended against this and encouraged him to continue taking it as scheduled.  His plan is to call Dr. Claris Gladden office first thing Monday morning, and I also encouraged him to call me back this evening should his symptoms worsen.  He expressed understanding.  Craig Dana MD

## 2015-12-23 ENCOUNTER — Telehealth: Payer: Self-pay | Admitting: Internal Medicine

## 2015-12-23 NOTE — Telephone Encounter (Signed)
Received call from pt. Pt stated he is has been experiencing moderate SOB upon excertion since starting Metoprolol 50 mg twice this past Friday (12/20/15). Pt said he has to stop after walking 100 yards. Pt stated his O2 sats have been 90-95% (which is pt's baseline). Pt is alert and oriented. Pt did not have a current BP for today, but stated they have been around pt's baseline of [id number] systolic. HR has been 44-135. Pt stated he has been in a-fib for over a month now. Pt denies chest pain. Pt is taking all medications as indicated.  Informed pt to report to the emergency department if he experiences worsening SOB, dizziness, or CP. Pt verbalized understanding and agreed with plan. Will forward to Dr. Lovena Le to advise.

## 2015-12-23 NOTE — Telephone Encounter (Signed)
Returned pt's call. Pt unavailable. Left voice message to call back.

## 2015-12-23 NOTE — Telephone Encounter (Signed)
New message  Pt c/o medication issue:  1. Name of Medication: Metoprolol  2. How are you currently taking this medication (dosage and times per day)? 50mg   3. Are you having a reaction (difficulty breathing--STAT)? Per pt sob has incresed  4. What is your medication issue? Pt call requesting to speak with RN about bp increasing. Please call back to discuss

## 2015-12-24 NOTE — Telephone Encounter (Signed)
Call, spoke with pt. Dr. Tanna Furry recommendation is for pt to continue the Metoprolol 50 mg twice a day for 2 more weeks. Explained the medication increase could be causing SOB. After 2 weeks, SOB should subside. Informed pt to call our office if symptoms worsen before 2 weeks. Pt was pleasant, verbalized understanding, and agreed with plan.

## 2015-12-25 MED ORDER — METOPROLOL TARTRATE 25 MG PO TABS: 12.5000 mg | ORAL_TABLET | Freq: Two times a day (BID) | ORAL | 3 refills | Status: DC

## 2015-12-25 NOTE — Telephone Encounter (Signed)
Pt calling to speak to nurse regarding BP and SOB, pls call [phone]

## 2015-12-25 NOTE — Telephone Encounter (Signed)
Call, spoke with pt. Pt informed he is experiencing worsening SOB upon exertion. Pt sat was 78% and BP 81/57 after going to the grocery store. Sat improved to 90% 30 minuntes later. Pt denies CP. No other symptoms noted. Pt said he is only drinking 4-5 cups of water a day. I asked him to increase water intake to drink 6-8 cups of water a day.  I will forward to Dr. Lovena Le to advise. Pt verbalized understand and agreed with plan.

## 2015-12-25 NOTE — Telephone Encounter (Signed)
Called, spoke with pt. Dr. Tanna Furry recommendation is to decrease Metoprolol to 12.5 mg (1/2 tablet)  twice a day. Informed pt to pick up new Rx because the mg tablet of Metoprolol he has now is 50 mg. Informed to call our office if symptoms are the same worsen. Pt verbalized understanding and agreed with plan.

## 2015-12-27 NOTE — Telephone Encounter (Signed)
[employer] called to advise that the patient had active rx's for carvedilol and metoprolol. Per phone note from 12/18/15, patient was instructed to stop taking the carvedilol. They last mailed the carvedilol to the patient on 12/05/15. I was transferred to pharmacist and she stated that she would discontinue the carvedilol from the patients profile.

## 2016-01-07 ENCOUNTER — Encounter: Payer: Self-pay | Admitting: Internal Medicine

## 2016-01-07 ENCOUNTER — Ambulatory Visit (HOSPITAL_COMMUNITY)
Admission: RE | Admit: 2016-01-07 | Discharge: 2016-01-07 | Disposition: A | Discharge status: Home / Self Care | Payer: Medicare Other | Source: Ambulatory Visit | Attending: Cardiology | Admitting: Cardiology

## 2016-01-07 VITALS — BP 112/56 | HR 81 | Wt 152.5 lb

## 2016-01-07 DIAGNOSIS — I4891 Unspecified atrial fibrillation: Secondary | ICD-10-CM

## 2016-01-07 DIAGNOSIS — I481 Persistent atrial fibrillation: Secondary | ICD-10-CM | POA: Diagnosis not present

## 2016-01-07 DIAGNOSIS — Z950 Presence of cardiac pacemaker: Secondary | ICD-10-CM | POA: Insufficient documentation

## 2016-01-07 DIAGNOSIS — E785 Hyperlipidemia, unspecified: Secondary | ICD-10-CM | POA: Diagnosis not present

## 2016-01-07 DIAGNOSIS — Z79899 Other long term (current) drug therapy: Secondary | ICD-10-CM | POA: Insufficient documentation

## 2016-01-07 DIAGNOSIS — I5022 Chronic systolic (congestive) heart failure: Secondary | ICD-10-CM | POA: Diagnosis not present

## 2016-01-07 DIAGNOSIS — Z7901 Long term (current) use of anticoagulants: Secondary | ICD-10-CM | POA: Diagnosis not present

## 2016-01-07 DIAGNOSIS — I442 Atrioventricular block, complete: Secondary | ICD-10-CM | POA: Insufficient documentation

## 2016-01-07 DIAGNOSIS — Z87891 Personal history of nicotine dependence: Secondary | ICD-10-CM | POA: Diagnosis not present

## 2016-01-07 DIAGNOSIS — I2510 Atherosclerotic heart disease of native coronary artery without angina pectoris: Secondary | ICD-10-CM | POA: Diagnosis not present

## 2016-01-07 DIAGNOSIS — I428 Other cardiomyopathies: Secondary | ICD-10-CM | POA: Insufficient documentation

## 2016-01-07 LAB — BASIC METABOLIC PANEL
Anion gap: 6 (ref 5–15)
BUN: 27 mg/dL — AB (ref 6–20)
CHLORIDE: 105 mmol/L (ref 101–111)
CO2: 27 mmol/L (ref 22–32)
CREATININE: 1.11 mg/dL (ref 0.61–1.24)
Calcium: 9.4 mg/dL (ref 8.9–10.3)
GFR calc Af Amer: >60 mL/min (ref >60)
GFR calc non Af Amer: 59 mL/min — ABNORMAL LOW (ref >60)
GLUCOSE: 120 mg/dL — AB (ref 65–99)
POTASSIUM: 4.5 mmol/L (ref 3.5–5.1)
SODIUM: 138 mmol/L (ref 135–145)

## 2016-01-07 LAB — TSH: TSH: 8.821 u[IU]/mL — AB (ref 0.350–4.500)

## 2016-01-07 LAB — BRAIN NATRIURETIC PEPTIDE: B Natriuretic Peptide: 449.5 pg/mL — ABNORMAL HIGH (ref 0.0–100.0)

## 2016-01-07 LAB — T4, FREE: Free T4: 0.85 ng/dL (ref 0.61–1.12)

## 2016-01-07 MED ORDER — METOPROLOL SUCCINATE ER 25 MG PO TB24: 25.0000 mg | ORAL_TABLET | Freq: Every day | ORAL | 3 refills | Status: DC

## 2016-01-07 NOTE — Patient Instructions (Signed)
STOP Metoprolol TARTRATE START Metoprolol SUCCINATE 25 mg, one half tab for one week then increase to one whole tab   Labs today We will only contact you if something comes back abnormal or we need to make some changes. Otherwise no news is good news!  Your physician recommends that you schedule a follow-up appointment in: 2 months with Dr Aundra Dubin  Be sure to call prior to the end of the year with your mail order pharmacy so we can send in all new prescriptions for you.

## 2016-01-07 NOTE — Progress Notes (Signed)
Patient ID: [name], male   DOB: [birth year], 82 y.o.   MRN: [id number] PCP: Dr. Yong Channel Cardiology: Dr. Aundra Dubin  82 yo with history of paroxysmal atrial fibrillation, nonischemic cardiomyopathy, and complete heart block with St Jude CRT-P system presents for cardiology followup.  He was admitted in 4/14 with pacemaker pocket infection.  His pacemaker was removed and temporary-permanent device was placed.  Later, he had the temporary-permanent device removed and a new CRT-P device was placed. He developed dyspnea post-operatively and was found to have a large right-sided pleural effusion.  He was admitted and got a chest tube for drainage. He had followup with Dr. Servando [name] and it was decided that he would not need VATS.  He had been on amiodarone for maintenance of NSR.  This had been more successful than Tikosyn.   Around 3/15, he started developing increasing exertional dyspnea.  He was short of breath walking up a hill or incline.  He could still walk on the treadmill for exercise for 10-15 minutes and use the elliptical without much trouble on most days.  No orthopnea or PND.  No chest pain.  He saw Dr [name] and was started on Lasix three times a week due to concern for volume overload.  This did not seem to have helped much.  After that, he had PFTs done which showed normal spirometry but DLCO 50% predicted.  Therefore, amiodarone was stopped due to concern for lung toxicity.  He saw [name] for a pre-operative evaluation prior to right inguinal hernia repair.  Given the exertional dyspnea, he was set up for adenosine Cardiolite in 5/15.  This showed EF 36% with a small area of primarily scar in the apex, apical lateral wall, and mid anterolateral wall.  He has not felt palpitations. Echo in 5/15 showed EF 40-45%, similar to prior study.   I had him see pulmonary for evaluation for amiodarone lung toxicity.  CT chest looked ok, and he was not thought to have amiodarone lung toxicity.   At a  prior appointment in 2016, [name] was noted to be back in atrial fibrillation.  He was more short of breath with exertion and fatigued.  He has historically tolerated atrial fibrillation poorly.  I did a TEE-guided DCCV in 5/16 back to NSR.  TEE showed EF 35-40%.  He did not immediately feel better like he has in the past post-DCCV, so Lasix was increased.    Recurrent symptomatic atrial fibrillation in 8/17 with TEE-guided DCCV to NSR.  TEE showed EF 25-30%.  Recurrent symptomatic atrial fibrillation with CHF exacerbation in 9/17.  He had repeat DCCV and Lasix was increased.   He remains in atrial fibrillation today.  He was seen not long ago by Dr. Lovena [name] who stopped his amiodarone due to persistent atrial fibrillation.  He also stopped [name] and started metoprolol 25 mg bid.  This was subsequently decreased to 12.5 mg bid.  Overall, he feels better.  No dyspnea walking on flat ground.  Mild dyspnea walking up a hill.  No orthopnea/PND.  No chest pain.      [id number] fibrillation, intermittent v-pacing  Labs (7/13); LDL 65, HDL 38 Labs (3/14): TSH normal Labs (4/14):  ALT 61, AST 62 Labs (5/14): K 4.5, creatinine 0.9, BNP 727, digoxin 0.9 Labs (6/14): LDL 77, HDL 34, K 4, creatinine 0.9, TSH normal Labs (10/14): digoxin 2.1, K 4.7, creatinine 1.0, TSH normal, LFTs normal Labs (3/15): K 4, creatinine 1.1 Labs (4/15): BNP 482  Labs (5/15): K 3.8, creatinine 1.2 Labs (6/15): K 4.3, creatinine 0.9 Labs (7/15): TSH normal, LFTs normal Labs (9/15): TSH normal, LFTs normal Labs (1/16): K 4.3, creatinine 1.07 Labs (5/16): TSH normal, LFTs normal, K 4.4, creatinine 1.02 Labs (6/16): K 4.4, creatinine 1.29 Labs (9/16): LDL 92, LFTs normal Labs (10/16): K 4.5, creatinine 1.07 Labs (3/17): TSH normal Labs (9/17): K 3.4, creatinine 1.32 => 1.31, BNP 837 => 639  PMH: 1. Low back pain 2. Hyperlipidemia 3. GERD 4. BPH 5. Polymyalgia rheumatica 6. Atrial fibrillation: Failed  Tikosyn in the past.  Paroxysmal, on amiodarone and warfarin.  DCCV in 4/13, 7/13, 10/13.  Has held NSR with amiodarone.  However, concern for amiodarone lung toxicity: PFTs (4/15) with FVC 90% predicted, FEV1 90%, ratio 97%, DLCO 50%.   Recurrence of atrial fibrillation in 5/16, TEE-guided DCCV back to NSR in 5/16.  - Recurrence of atrial fibrillation in 8/17. TEE-guided DCCV 11/07/15.  - Recurrence of atrial fibrillation 9/17.  DCCV 11/20/15 but back in atrial fibrillation by 11/26/15.  - Amiodarone stopped due to persistent atrial fibrillation.  7. Transaminitis: Mild, attributed to amiodarone.  8. Complete heart block s/p St Jude CRT-P device.  Patient developed PCM pocket infection in 4/14 and had his first system removed with placement of a temporary permanent PCM.  He later had CRT-P device replaced.  This was complicated by large right-sided hemorrhagic pleural effusion requiring chest tube.   9. Nonischemic cardiomyopathy: Echo (9/13) with EF 40-45%, diffuse HK, mild Craig.  Mild CAD only on prior LHC.  Echo (4/14) with EF 45-50%.  Adenosine Cardiolite (5/15) with EF 36% (visually appeared higher per report), small area of scar with peri-infarct ischemia in the apex, apical lateral wall, and mid anterolateral wall. Echo (5/15) with EF 40-45%, basal to mid inferolateral severe hypokinesis, basal to mid anterolateral hypokinesis, mildly dilated RV with mildly decreased RV systolic function, mild to moderate Craig.  TEE (5/16) with EF 35-40%, inferior and inferolateral severe hypokinesis, normal RV size with mildly decreased systolic function, moderate Craig, peak RV-RA gradient 26 mmHg.  - TEE (8/17): EF 25-30%, mildly dilated RV with mildly decreased systolic function, moderate biatrial enlargement, moderate central Craig.  10. Large right pleural effusion requiring chest tube following removal and reimplantation of PCM. Most recent chest CT in 6/15 showed significant decrease in size of loculated pleural effusion  since 5/14.    SH: Married, prior smoker (quit 1985), no ETOH x years, retired, lives at [employer]  [place]: Father with MI at 60, mother with AAA.   ROS: All systems reviewed and negative except as per HPI.   Current Outpatient Prescriptions  Medication Sig Dispense Refill  . furosemide (LASIX) 40 MG tablet Take 1 tablet (40 mg total) by mouth 2 (two) times daily. 180 tablet 2  . losartan (COZAAR) 25 MG tablet 1/2 tablet (12.5mg  ) by mouth  daily 135 tablet 3  . potassium chloride SA (K-DUR,KLOR-CON) 20 MEQ tablet Take 1 tablet (20 mEq total) by mouth 2 (two) times daily. 180 tablet 2  . pravastatin (PRAVACHOL) 80 MG tablet Take 0.5 tablets (40 mg total) by mouth daily. 45 tablet 3  . spironolactone (ALDACTONE) 25 MG tablet Take 0.5 tablets (12.5 mg total) by mouth every evening. 45 tablet 3  . tamsulosin (FLOMAX) 0.4 MG CAPS capsule Take 1 capsule (0.4 mg total) by mouth daily. 90 capsule 3  . warfarin (COUMADIN) 5 MG tablet TAKE 1/2 TO 1 TABLET (2.5 TO 5 MG TOTAL) DAILY AS  DIRECTED BY ANTICOAGULATION CLINIC 90 tablet 3  . metoprolol succinate (TOPROL-XL) 25 MG 24 hr tablet Take 1 tablet (25 mg total) by mouth daily. Take with or immediately following a meal. 90 tablet 3   No current facility-administered medications for this encounter.     BP (!) 112/56   Pulse 81   Wt 152 lb 8 oz (69.2 kg)   SpO2 100%   BMI 24.61 kg/m  General: NAD Neck: JVP 7 cm, no thyromegaly or thyroid nodule.  Lungs: CTAB  CV: Nondisplaced PMI.  Heart irregular S1/S2, no S3/S4, no murmur.  No edema.  No carotid bruit.  Normal pedal pulses.  Abdomen: Soft, nontender, no hepatosplenomegaly, no distention.  Neurologic: Alert and oriented x 3.  Psych: Normal affect. Extremities: No clubbing or cyanosis.   Assessment/Plan: 1. Chronic systolic CHF:  Presumed nonischemic cardiomyopathy (prior cath with only mild CAD).  He had an adenosine Cardiolite in 5/15 with EF 36% and possible small area of apical and lateral  scar with peri-infarct ischemia. He has St Jude CRT-P.  TEE in 8/17 showed EF 25-30% . Symptomatically, he is doing much better on higher Lasix dose.  Suspect atrial fibrillation triggers CHF and will lead to higher Lasix requirement. NYHA class II symptoms, not volume overloaded today.  - Continue Lasix 40 mg bid.  - Continue current losartan and spironolactone.  No BP room for titration.  - Stop metoprolol and start Toprol XL 12.5 mg daily.  If he tolerates this, increase to 25 mg daily after a week.  - BMET/BNP today.  2. Atrial fibrillation: Now persistent.  Had breakthrough with Tikosyn and now not able to hold NSR on amiodarone. He is now off amiodarone.  Saw Dr. Lovena [name], decided on rate control strategy. - Continue warfarin.  3. Hyperlipidemia: History of nonobstructive CAD.  Continue pravastatin.    Followup in 2 months.    [name] 01/07/2016

## 2016-01-08 LAB — T3, FREE: T3, Free: 2.4 pg/mL (ref 2.0–4.4)

## 2016-01-13 ENCOUNTER — Ambulatory Visit (INDEPENDENT_AMBULATORY_CARE_PROVIDER_SITE_OTHER): Payer: Medicare Other

## 2016-01-13 DIAGNOSIS — I4891 Unspecified atrial fibrillation: Secondary | ICD-10-CM

## 2016-01-13 DIAGNOSIS — Z5181 Encounter for therapeutic drug level monitoring: Secondary | ICD-10-CM

## 2016-01-13 LAB — POCT INR: INR: 1.6

## 2016-01-16 DIAGNOSIS — R3912 Poor urinary stream: Secondary | ICD-10-CM | POA: Diagnosis not present

## 2016-01-16 DIAGNOSIS — N401 Enlarged prostate with lower urinary tract symptoms: Secondary | ICD-10-CM | POA: Diagnosis not present

## 2016-01-24 ENCOUNTER — Ambulatory Visit (INDEPENDENT_AMBULATORY_CARE_PROVIDER_SITE_OTHER): Payer: Medicare Other | Admitting: *Deleted

## 2016-01-24 DIAGNOSIS — Z5181 Encounter for therapeutic drug level monitoring: Secondary | ICD-10-CM

## 2016-01-24 DIAGNOSIS — I4891 Unspecified atrial fibrillation: Secondary | ICD-10-CM

## 2016-01-24 LAB — POCT INR: INR: 2.8

## 2016-01-28 ENCOUNTER — Ambulatory Visit (INDEPENDENT_AMBULATORY_CARE_PROVIDER_SITE_OTHER): Payer: Medicare Other | Admitting: Family Medicine

## 2016-01-28 ENCOUNTER — Encounter: Payer: Self-pay | Admitting: Family Medicine

## 2016-01-28 VITALS — BP 94/66 | HR 79 | Temp 98.0°F | Ht 66.0 in | Wt 156.4 lb

## 2016-01-28 DIAGNOSIS — L570 Actinic keratosis: Secondary | ICD-10-CM

## 2016-01-28 DIAGNOSIS — R159 Full incontinence of feces: Secondary | ICD-10-CM | POA: Diagnosis not present

## 2016-01-28 DIAGNOSIS — D1724 Benign lipomatous neoplasm of skin and subcutaneous tissue of left leg: Secondary | ICD-10-CM

## 2016-01-28 DIAGNOSIS — M25551 Pain in right hip: Secondary | ICD-10-CM

## 2016-01-28 NOTE — Patient Instructions (Signed)
We will call you within a week about your referral to [facility] and Dr. Georgette Dover. If you do not hear within 2 weeks, give [place] a call.   Next visit- if spot persists on left face- could try cryotherapy- if fails- refer to dermatology.

## 2016-01-28 NOTE — Assessment & Plan Note (Signed)
S: more of incomplete emptying feeling- ? If hemorrhoids had been involved A/P: we reviewed notes from Dr. Carlean Purl last year and patient had trialed steps noted- next step was to follow up again with GI so I encouraged return visit- plan was to consider hemorrhoidal banding- would need approval  From cardiology as they prescribe his coumadin

## 2016-01-28 NOTE — Progress Notes (Signed)
Pre visit review using our clinic review tool, if applicable. No additional management support is needed unless otherwise documented below in the visit note. 

## 2016-01-28 NOTE — Assessment & Plan Note (Signed)
S: suspect AK on left face near ear. Advised cryotherapy. Has been there for 2 years- scales at times- slightly getting bigger. Does admit to picking at it at times.  A/P: patient does not want procedure before holidays- he opts to return sometime after holidays for this.

## 2016-01-28 NOTE — Assessment & Plan Note (Signed)
S: left inner thigh for about a year. Now if this gets pressed upon he starts to feel pain down into his left knee.  A/P: referral to Dr. Georgette Dover of general surgery- concern if there is nerve compression with pain radiated to knee- patient wants to consider removal before this worsens.

## 2016-01-28 NOTE — Progress Notes (Signed)
subjective:  [name] is a 82 y.o. year old very pleasant male patient who presents for/with See problem oriented charting ROS- no fever, chills, nausea. Does have right hip pain. Pain with left knee if pushes on thought to be lipoma.see any ROS included in HPI as well.   Past Medical History-  Patient Active Problem List   Diagnosis Date Noted  . Chronic systolic CHF (congestive heart failure) ([place]) 03/21/2013    Priority: High  . Nonischemic cardiomyopathy ([place]) 09/20/2010    Priority: High  . PPM-St.Jude 11/27/2008    Priority: High  . CAD (coronary artery disease) 07/20/2008    Priority: High  . SICK SINUS SYNDROME 07/20/2008    Priority: High  . Atrial fibrillation ([place]) 03/16/2007    Priority: High  . AK (actinic keratosis) 01/28/2016    Priority: Medium  . Lipoma 07/08/2015    Priority: Medium  . Neuropathy ([place]) 05/16/2015    Priority: Medium  . Chronic cough 08/18/2013    Priority: Medium  . Dyspnea 08/16/2013    Priority: Medium  . Pleural effusion, right 08/16/2013    Priority: Medium  . Pleural effusion on right 07/06/2012    Priority: Medium  . Hyperlipidemia 03/16/2007    Priority: Medium  . BPH (benign prostatic hyperplasia) 03/16/2007    Priority: Medium  . Left breast lump 07/15/2015    Priority: Low  . Encounter for therapeutic drug monitoring 07/02/2014    Priority: Low  . Allergic rhinitis 02/14/2014    Priority: Low  . Fecal incontinence 10/18/2013    Priority: Low  . Recurrent right inguinal hernia 11/22/2012    Priority: Low  . Intention tremor 11/01/2012    Priority: Low  . Polymyalgia rheumatica ([place]) 07/03/2008    Priority: Low  . MIXED HEARING LOSS BILATERAL 01/10/2008    Priority: Low  . GERD 03/16/2007    Priority: Low  . Trigger thumb of right hand 07/04/2014  . Fecal soiling due to fecal incontinence 04/20/2014    Medications- reviewed and updated Current Outpatient Prescriptions  Medication Sig Dispense Refill  .  furosemide (LASIX) 40 MG tablet Take 1 tablet (40 mg total) by mouth 2 (two) times daily. 180 tablet 2  . losartan (COZAAR) 25 MG tablet 1/2 tablet (12.5mg  ) by mouth  daily 135 tablet 3  . metoprolol succinate (TOPROL-XL) 25 MG 24 hr tablet Take 1 tablet (25 mg total) by mouth daily. Take with or immediately following a meal. 90 tablet 3  . potassium chloride SA (K-DUR,KLOR-CON) 20 MEQ tablet Take 1 tablet (20 mEq total) by mouth 2 (two) times daily. 180 tablet 2  . pravastatin (PRAVACHOL) 80 MG tablet Take 0.5 tablets (40 mg total) by mouth daily. 45 tablet 3  . spironolactone (ALDACTONE) 25 MG tablet Take 0.5 tablets (12.5 mg total) by mouth every evening. 45 tablet 3  . tamsulosin (FLOMAX) 0.4 MG CAPS capsule Take 1 capsule (0.4 mg total) by mouth daily. 90 capsule 3  . warfarin (COUMADIN) 5 MG tablet TAKE 1/2 TO 1 TABLET (2.5 TO 5 MG TOTAL) DAILY AS DIRECTED BY ANTICOAGULATION CLINIC 90 tablet 3   No current facility-administered medications for this visit.     Objective: BP 94/66 (BP Location: Left Arm, Patient Position: Sitting, Cuff Size: Normal)   Pulse 79   Temp 98 F (36.7 C) (Oral)   Ht 5\' 6"  (1.676 m)   Wt 156 lb 6.4 oz (70.9 kg)   SpO2 98%   BMI 25.24 kg/m  Gen: NAD, resting comfortably CV: irregularly irregular  Ext: no edema Skin: warm, dry MSK: pain with flexion and IR of right hip, full ROM and no pain on left hip. No hernia right groin- one about 2 cm lymph node noted. Slightly antalgic gait On left upper thigh- mobile subcutaneous soft lesion thought to be lipoma- with pain into knee when palpated.   Assessment/Plan:  Lipoma S: left inner thigh for about a year. Now if this gets pressed upon he starts to feel pain down into his left knee.  A/P: referral to Dr. Georgette Dover of general surgery- concern if there is nerve compression with pain radiated to knee- patient wants to consider removal before this worsens.    AK (actinic keratosis) S: suspect AK on left face  near ear. Advised cryotherapy. Has been there for 2 years- scales at times- slightly getting bigger. Does admit to picking at it at times.  A/P: patient does not want procedure before holidays- he opts to return sometime after holidays for this.    Fecal soiling due to fecal incontinence S: more of incomplete emptying feeling- ? If hemorrhoids had been involved A/P: we reviewed notes from Dr. Carlean Purl last year and patient had trialed steps noted- next step was to follow up again with GI so I encouraged return visit- plan was to consider hemorrhoidal banding- would need approval  From cardiology as they prescribe his coumadin  Right hip pain S:For a few weeks- pain in right groin. Worst when he works out in the AM than when he doesn't work out. 7/10 pain at its worst. No issues at rest. Tylenol helps minimally A/P: suspect this may be arthritis related with pain with flexion and IR of hip. Discussed getting films vs. Ortho referral- patient opts for ortho since he can get x-rays and discuss with doctor day of visit- requests [facility] since wife goes there. Did not note any obvious hernia- does have one subcutaneous area that I was concerned about lymph node enlargement or small lipoma- repeat at follow up  Orders Placed This Encounter  Procedures  . Ambulatory referral to General Surgery    Referral Priority:   Routine    Referral Type:   Surgical    Referral Reason:   Specialty Services Required    Requested Specialty:   General Surgery    Number of Visits Requested:   1  . Ambulatory referral to Orthopedics    Referral Priority:   Routine    Referral Type:   Consultation    Number of Visits Requested:   1   The duration of face-to-face time during this visit was greater than 30 minutes. Greater than 50% of this time was spent in counseling about patient concerns about 4 different acute concerns as noted above  Return precautions advised.  Garret Reddish, MD

## 2016-02-07 ENCOUNTER — Ambulatory Visit: Payer: Self-pay | Admitting: Surgery

## 2016-02-07 DIAGNOSIS — D1720 Benign lipomatous neoplasm of skin and subcutaneous tissue of unspecified limb: Secondary | ICD-10-CM | POA: Diagnosis not present

## 2016-02-07 NOTE — H&P (Signed)
History of Present Illness [name]. Craig Fittro MD; 02/07/2016 12:32 PM) The patient is a 82 year old male who presents with a complaint of Mass. This is an 83 year-old male who is status post open repair of a recurrent right inguinal hernia in 2015. Over the last six months, he has developed a palpable mass on his anterior left thigh. Pressure over this area causes pain shoot down towards his knee. This area has never been infected. He presents now to discuss excision to relieve his symptoms. He remains anticoagulated on Coumadin. Cardiologist Dr. Mariann Laster.  According to the office notes from Dr. Yong Channel, the patient also has been experiencing some fecal incontinence. Upon further questioning, it appears that he is not able to fully evacuate and therefore has some soilage through the course of the day. He is scheduled to see Dr. Carlean Purl of GI to discuss this problem. However I informed him that if he needed to seek any type of surgical intervention for this problem, we would be glad to schedule him to see Dr. Marcello Moores, our colorectal surgeon.   Problem List/Past Medical Maia Petties, MD; 02/07/2016 12:33 PM) LIPOMA OF THIGH (D17.20) RECURRENT RIGHT INGUINAL HERNIA (K40.91)  Other Problems Maia Petties, MD; [id number] 0000000 PM) Umbilical Hernia Repair Inguinal Hernia Atrial Fibrillation Back Pain  Past Surgical History Maia Petties, MD; 02/07/2016 12:33 PM) Open Inguinal Hernia Surgery Right. Tonsillectomy Cataract Surgery Bilateral. Ventral / Umbilical Hernia Surgery multiple  Diagnostic Studies History Maia Petties, MD; 02/07/2016 12:33 PM) Colonoscopy 5-10 years ago  Allergies Davy Pique Bynum, CMA; 02/07/2016 10:03 AM) Antihistamine Decongestant *COUGH/COLD/ALLERGY* Amiodarone HCl *ANTIARRHYTHMICS*  Medication History (Sonya Bynum, CMA; 02/07/2016 10:04 AM) Furosemide (20MG  Tablet, Oral daily) Active. Potassium Chloride Crys ER [facility] Tablet ER, Oral  daily) Active. Carvedilol (6.25MG  Tablet, Oral daily) Active. Flonase Allergy Relief (50MCG/ACT Suspension, Nasal as needed) Active. Lisinopril (2.5MG  Tablet, Oral daily) Active. Pravastatin Sodium (40MG  Tablet, Oral daily) Active. Warfarin Sodium (5MG  Tablet, Oral daily) Active. Flomax (0.4MG  Capsule ER 24HR, Oral daily) Active. Metoprolol Succinate ER (25MG  Tablet ER 24HR, Oral) Active. Tamsulosin HCl (0.4MG  Capsule, Oral) Active. Medications Reconciled  Social History Maia Petties, MD; [id number] 0000000 PM) Illicit drug use Uses socially only. No caffeine use Alcohol use Remotely quit alcohol use. Tobacco use Former smoker.  Family History Maia Petties, MD; 02/07/2016 12:33 PM) Heart Disease Father. Respiratory Condition Mother.    Vitals (Sonya Bynum CMA; 02/07/2016 10:03 AM) 02/07/2016 10:03 AM Weight: 156 lb Height: 66in Body Surface Area: 1.8 m Body Mass Index: 25.18 kg/m  Temp.: 66F(Temporal)  Pulse: 74 (Regular)  BP: 130/76 (Sitting, Left Arm, Standard)      Physical Exam Rodman Key K. Tagen Milby MD; 02/07/2016 12:33 PM)  The physical exam findings are as follows: Note:WDWN in NAD Eyes: Pupils equal, round; sclera anicteric HENT: Oral mucosa moist; good dentition Neck: No masses palpated, no thyromegaly Lungs: CTA bilaterally; normal respiratory effort CV: Regular rate and rhythm; no murmurs; extremities well-perfused with no edema Abd: +bowel sounds, soft, non-tender, no palpable organomegaly; no palpable hernias; healed incision Anterior left thigh - 3 cm palpable firm, mobile, smooth mass; tender in lower thigh upon deep palpation of the mass Skin: Warm, dry; no sign of jaundice Psychiatric - alert and oriented x 4; calm mood and affect    Assessment & Plan Rodman Key K. Andrell Bergeson MD; 02/07/2016 10:35 AM)  LIPOMA OF THIGH (D17.20) Impression: Anterior left thigh - 3 cm - subcutaneous  Current Plans Schedule for Surgery -  Excision  of subcutaneous lipoma - anterior left thigh. The surgical procedure has been discussed with the patient. Potential risks, benefits, alternative treatments, and expected outcomes have been explained. All of the patient's questions at this time have been answered. The likelihood of reaching the patient's treatment goal is good. The patient understand the proposed surgical procedure and wishes to proceed.  [name]. Georgette Dover, MD, [facility] Surgery  General/ Trauma Surgery  02/07/2016 12:33 PM

## 2016-02-10 ENCOUNTER — Encounter: Payer: Self-pay | Admitting: Gastroenterology

## 2016-02-10 ENCOUNTER — Telehealth: Payer: Self-pay

## 2016-02-10 ENCOUNTER — Other Ambulatory Visit: Payer: Self-pay | Admitting: Family Medicine

## 2016-02-10 ENCOUNTER — Ambulatory Visit (INDEPENDENT_AMBULATORY_CARE_PROVIDER_SITE_OTHER): Payer: Medicare Other | Admitting: Gastroenterology

## 2016-02-10 VITALS — BP 90/60 | HR 80 | Ht 66.0 in | Wt 154.8 lb

## 2016-02-10 DIAGNOSIS — R151 Fecal smearing: Secondary | ICD-10-CM | POA: Diagnosis not present

## 2016-02-10 DIAGNOSIS — Z7901 Long term (current) use of anticoagulants: Secondary | ICD-10-CM | POA: Insufficient documentation

## 2016-02-10 DIAGNOSIS — R159 Full incontinence of feces: Secondary | ICD-10-CM

## 2016-02-10 DIAGNOSIS — K648 Other hemorrhoids: Secondary | ICD-10-CM | POA: Diagnosis not present

## 2016-02-10 NOTE — Progress Notes (Signed)
     02/10/2016 [name] [id number] Dec 28, 1929   History of Present Illness:  Patient is an 82 year old male known to Dr. Carlean Purl.  Has been seen on several occasions for fecal leakage/incontinence.  Has been treated with fiber supplements and Miralax (although has not tried them together) without success.  Last colonoscopy in 2015 was unremarkable.  At his visit with Dr. Carlean Purl in 05/2014 they discussed internal hemorrhoid banding as an option and patient would like to proceed with that to see if it will help.  He is on coumadin for PAF, which was the hesitating/complicating factor for banding in the past.  He tells me that Miralax made the leakage and issues worse.  Says that he has soft, formed stools, but then throughout the day he will continue to pass small pieces of formed stool without his knowledge.  Has to wear a pad to protect his underwear.   Current Medications, Allergies, Past Medical History, Past Surgical History, Family History and Social History were reviewed in [employer] record.   Physical Exam: BP 90/60   Pulse 80   Ht 5\' 6"  (1.676 m)   Wt 154 lb 12.8 oz (70.2 kg)   BMI 24.99 kg/m  General: Well developed white male in no acute distress Head: Normocephalic and atraumatic Eyes:  Sclerae anicteric, conjunctiva pink  Ears: Normal auditory acuity Lungs: Clear throughout to auscultation Heart: Regular rate and rhythm Abdomen: Soft, non-distended.  Normal bowel sounds.  Non-tender.  Rectal:  Will be done at the time of banding. Musculoskeletal: Symmetrical with no gross deformities  Extremities: No edema  Neurological: Alert oriented x 4, grossly non-focal Psychological:  Alert and cooperative. Normal mood and affect  Assessment and Recommendations: -Fecal leakage/incontinence:  Has tried other conservative measures such as fiber supplements and Miralax (although has not tried them together).  He would like to proceed with hemorrhoid  banding that was discussed with Dr Carlean Purl at his last visit to see if that would help. -Internal hemorrhoids:  ? Contributing to his fecal leakage/incontinence.  Patient would like to proceed after failing other conservative measures. -Chronic anticoagulation with coumadin for atrial fibrillation:  Needs to hold coumadin for 2 days prior to hemorrhoid banding and one week following.  Will contact Dr. Aundra Dubin to be sure that holding coumadin is reasonable in this case.

## 2016-02-10 NOTE — Telephone Encounter (Signed)
   [name] 08-17-1929 [id number]  Dear Dr. Aundra Dubin:  We have scheduled the above named patient for a(n) hemorrhoid banding procedure. Our records show that (s)he is on anticoagulation therapy.  Please advise as to whether the patient may come of their therapy of coumadin 2 days prior to their procedure which is scheduled for 03/06/16.  Please route your response to Marlon Pel, CMA or fax response to [phone].  Sincerely,    [facility]

## 2016-02-10 NOTE — Patient Instructions (Signed)
Your appointment for hemorrhoid banding with Dr. Carlean Purl is on 03/06/16 at 3:00pm.   You will need to hold your coumadin 2 days prior to your banding but we will contact you after we get clearance from Dr. Aundra Dubin.

## 2016-02-10 NOTE — Telephone Encounter (Signed)
Ok to hold warfarin for 2 days.

## 2016-02-11 NOTE — Telephone Encounter (Signed)
Larey Dresser, MD  You; Marsh Dolly Schub, RN 14 hours ago (7:16 PM)    Ok to hold warfarin for 2 days.

## 2016-02-11 NOTE — Telephone Encounter (Signed)
That is ok (5 days)

## 2016-02-11 NOTE — Telephone Encounter (Signed)
Left a message for patient to return my call. 

## 2016-02-11 NOTE — Telephone Encounter (Signed)
Error in letter. Is a 5 day hold on warfarin ok Dr. Aundra Dubin?

## 2016-02-11 NOTE — Telephone Encounter (Signed)
This is for hemorrhoid banding and only has to be held 2 days prior to banding. Informed patient that he needs to hold warfarin 2 days prior to banding per Dr. Aundra Dubin. Patient verbalized understanding.

## 2016-02-14 ENCOUNTER — Telehealth: Payer: Self-pay | Admitting: *Deleted

## 2016-02-14 ENCOUNTER — Ambulatory Visit (INDEPENDENT_AMBULATORY_CARE_PROVIDER_SITE_OTHER): Payer: Medicare Other | Admitting: *Deleted

## 2016-02-14 ENCOUNTER — Telehealth (HOSPITAL_COMMUNITY): Payer: Self-pay

## 2016-02-14 DIAGNOSIS — Z5181 Encounter for therapeutic drug level monitoring: Secondary | ICD-10-CM | POA: Diagnosis not present

## 2016-02-14 DIAGNOSIS — I4891 Unspecified atrial fibrillation: Secondary | ICD-10-CM | POA: Diagnosis not present

## 2016-02-14 LAB — POCT INR: INR: 3

## 2016-02-14 NOTE — Telephone Encounter (Signed)
Lindsey from Dr. Chanetta Marshall office calling CHF clinic triage to ask if ok to continue coumadin with dental extraction procedure on Monday.  INR 3 weeks ago 2.8 Will ask his advice and fax to provided fax # of office.  Renee Pain, RN

## 2016-02-14 NOTE — Telephone Encounter (Signed)
Pt is to hold coumadin on Sunday Dec 10th for INR < 2.5 per dentist request. Appt made for him to have INR checked in coumadin clinic on Dec 11th at 8am. Will call results to [facility] at Dr Mingo Amber office on Monday prior to extraction. Order and clearance done by [place] PharmD

## 2016-02-15 NOTE — Progress Notes (Signed)
Reviewed w/ Ms. Myrtice Lauth. Will try hemorrhoid banding to help fecal incontinence. Gatha Mayer, MD, [facility]

## 2016-02-17 ENCOUNTER — Telehealth (HOSPITAL_COMMUNITY): Payer: Self-pay | Admitting: *Deleted

## 2016-02-17 ENCOUNTER — Ambulatory Visit (INDEPENDENT_AMBULATORY_CARE_PROVIDER_SITE_OTHER): Payer: Medicare Other | Admitting: *Deleted

## 2016-02-17 DIAGNOSIS — I4891 Unspecified atrial fibrillation: Secondary | ICD-10-CM

## 2016-02-17 DIAGNOSIS — Z5181 Encounter for therapeutic drug level monitoring: Secondary | ICD-10-CM

## 2016-02-17 LAB — POCT INR: INR: 2.1

## 2016-02-17 NOTE — Telephone Encounter (Signed)
Ria Comment left a VM, they need pt's INR from today faxed to their office at [phone], note faxed

## 2016-02-21 ENCOUNTER — Telehealth (HOSPITAL_COMMUNITY): Payer: Self-pay | Admitting: *Deleted

## 2016-02-21 NOTE — Telephone Encounter (Signed)
Surgical clearance on patient faxed to [facility] @ [phone] with the following recommendations from Dr. Aundra Dubin:    "Increased risk but CHF has been stable.  May hold warfarin 5 days prior, [facility] for surgery as long as no new symptoms."

## 2016-02-23 ENCOUNTER — Emergency Department (HOSPITAL_COMMUNITY)
Admission: EM | Admit: 2016-02-23 | Discharge: 2016-02-23 | Disposition: A | Discharge status: Home / Self Care | Payer: Medicare Other | Attending: Emergency Medicine | Admitting: Emergency Medicine

## 2016-02-23 ENCOUNTER — Telehealth: Payer: Self-pay | Admitting: Physician Assistant

## 2016-02-23 ENCOUNTER — Encounter (HOSPITAL_COMMUNITY): Payer: Self-pay | Admitting: Emergency Medicine

## 2016-02-23 ENCOUNTER — Emergency Department (HOSPITAL_COMMUNITY): Payer: Medicare Other

## 2016-02-23 DIAGNOSIS — Z79899 Other long term (current) drug therapy: Secondary | ICD-10-CM | POA: Insufficient documentation

## 2016-02-23 DIAGNOSIS — R05 Cough: Secondary | ICD-10-CM | POA: Diagnosis not present

## 2016-02-23 DIAGNOSIS — I2510 Atherosclerotic heart disease of native coronary artery without angina pectoris: Secondary | ICD-10-CM | POA: Insufficient documentation

## 2016-02-23 DIAGNOSIS — I509 Heart failure, unspecified: Secondary | ICD-10-CM | POA: Insufficient documentation

## 2016-02-23 DIAGNOSIS — Z87891 Personal history of nicotine dependence: Secondary | ICD-10-CM | POA: Diagnosis not present

## 2016-02-23 DIAGNOSIS — Z7901 Long term (current) use of anticoagulants: Secondary | ICD-10-CM | POA: Diagnosis not present

## 2016-02-23 DIAGNOSIS — R0602 Shortness of breath: Secondary | ICD-10-CM | POA: Diagnosis not present

## 2016-02-23 DIAGNOSIS — Z950 Presence of cardiac pacemaker: Secondary | ICD-10-CM | POA: Diagnosis not present

## 2016-02-23 LAB — CBC WITH DIFFERENTIAL/PLATELET
Basophils Absolute: 0 10*3/uL (ref 0.0–0.1)
Basophils Relative: 0 %
EOS ABS: 0.2 10*3/uL (ref 0.0–0.7)
Eosinophils Relative: 2 %
HEMATOCRIT: 41.1 % (ref 39.0–52.0)
HEMOGLOBIN: 13.8 g/dL (ref 13.0–17.0)
LYMPHS ABS: 2.1 10*3/uL (ref 0.7–4.0)
LYMPHS PCT: 32 %
MCH: 30.5 pg (ref 26.0–34.0)
MCHC: 33.6 g/dL (ref 30.0–36.0)
MCV: 90.9 fL (ref 78.0–100.0)
MONOS PCT: 9 %
Monocytes Absolute: 0.6 10*3/uL (ref 0.1–1.0)
NEUTROS PCT: 57 %
Neutro Abs: 3.7 10*3/uL (ref 1.7–7.7)
Platelets: 171 10*3/uL (ref 150–400)
RBC: 4.52 MIL/uL (ref 4.22–5.81)
RDW: 16.3 % — ABNORMAL HIGH (ref 11.5–15.5)
WBC: 6.6 10*3/uL (ref 4.0–10.5)

## 2016-02-23 LAB — BASIC METABOLIC PANEL
Anion gap: 9 (ref 5–15)
BUN: 27 mg/dL — AB (ref 6–20)
CO2: 23 mmol/L (ref 22–32)
Calcium: 9.3 mg/dL (ref 8.9–10.3)
Chloride: 107 mmol/L (ref 101–111)
Creatinine, Ser: 1.24 mg/dL (ref 0.61–1.24)
GFR calc Af Amer: 60 mL/min — ABNORMAL LOW (ref >60)
GFR, EST NON AFRICAN AMERICAN: 52 mL/min — AB (ref >60)
GLUCOSE: 95 mg/dL (ref 65–99)
POTASSIUM: 4.1 mmol/L (ref 3.5–5.1)
Sodium: 139 mmol/L (ref 135–145)

## 2016-02-23 LAB — BRAIN NATRIURETIC PEPTIDE: B Natriuretic Peptide: 662.5 pg/mL — ABNORMAL HIGH (ref 0.0–100.0)

## 2016-02-23 LAB — PROTIME-INR
INR: 2.84
PROTHROMBIN TIME: 30.4 s — AB (ref 11.4–15.2)

## 2016-02-23 LAB — TROPONIN I: Troponin I: <0.03 ng/mL (ref <0.03)

## 2016-02-23 MED ORDER — FUROSEMIDE 10 MG/ML IJ SOLN
40.0000 mg | Freq: Once | INTRAMUSCULAR | Status: AC
  Administered 2016-02-23: 40 mg via INTRAVENOUS
  Filled 2016-02-23: qty 4

## 2016-02-23 MED ORDER — ACETAMINOPHEN 325 MG PO TABS
650.0000 mg | ORAL_TABLET | Freq: Once | ORAL | Status: AC
  Administered 2016-02-23: 650 mg via ORAL
  Filled 2016-02-23: qty 2

## 2016-02-23 NOTE — ED Notes (Signed)
Pt transported to XR.  

## 2016-02-23 NOTE — Telephone Encounter (Signed)
    He has been having significant exertional SOB. Symptoms similar to afib with RVR in the past, but just a little worse. No s/s CHF. No chest pain. No history of long car/airplane trips. Noticed his HR is up over 100 (has chronic afib on Toprol XL 25mg  daily). Wonders if he can increase his Toprol XL. Took pulse again while on the phone it was in the 24s and now in 26s.  However, has PPM. He will take an extra metoprolol tartrate now. I have arranged for a 10 am appointment in the H&V center with an APP.   Angelena Form PA-C  [facility]

## 2016-02-23 NOTE — ED Notes (Signed)
Pt. returned from XR. 

## 2016-02-23 NOTE — ED Provider Notes (Signed)
[facility] DEPT Provider Note   CSN: [id number] Arrival date & time: 02/23/16  1523     History   Chief Complaint Chief Complaint  Patient presents with  . Shortness of Breath    HPI [name] is a 82 y.o. male.  He has a history of congestive heart failure and atrial fibrillation. Over the last 3 days, he has had progressive exertional dyspnea. There has been associated chest heaviness and tightness. However, the chest heaviness and tightness has not been exertional. He denies orthopnea or paroxysmal nocturnal dyspnea. There is been and dry cough which had been present for several weeks before his dyspnea. He denies nausea or diaphoresis. He has not noticed any leg swelling. He is anticoagulated on warfarin. He did stop his warfarin for 1 day for dental work this past week.    Shortness of Breath     Past Medical History:  Diagnosis Date  . Arthritis    "back" (03/20/2014)  . Atrial fibrillation ([place])   . BPH (benign prostatic hypertrophy)   . Breast mass    "on both sides" (06/28/2012)  . Cardiomyopathy primary-nonischemic EF 45%  . CHF (congestive heart failure) ([place])   . Coronary artery disease   . Diverticulosis of colon without hemorrhage 01/03/2014  . Dysphagia   . Elevated liver enzymes   . Esophageal stricture   . GERD (gastroesophageal reflux disease)   . Hearing loss, mixed, bilateral   . Hx of cardiovascular stress test    Adenosine Myoview (07/2013):  Apical cap, apical lateral and mid anterolateral scar, small amount of peri-infarct ischemia, EF 36%; Medium Risk  . Hyperlipidemia   . Increased prostate specific antigen (PSA) velocity   . Internal hemorrhoids 01/03/2014  . Lumbar back pain   . Pacemaker    st jude  . Pacemaker infection ([place])    06/28/12  . Polymyalgia rheumatica ([place])   . Sick sinus syndrome [facility])     Patient Active Problem List   Diagnosis Date Noted  . Fecal soiling 02/10/2016  . Chronic anticoagulation 02/10/2016    . AK (actinic keratosis) 01/28/2016  . Left breast lump 07/15/2015  . Lipoma 07/08/2015  . Neuropathy ([place]) 05/16/2015  . Trigger thumb of right hand 07/04/2014  . Encounter for therapeutic drug monitoring 07/02/2014  . Fecal soiling due to fecal incontinence 04/20/2014  . Allergic rhinitis 02/14/2014  . Internal hemorrhoids 01/03/2014  . Fecal incontinence 10/18/2013  . Chronic cough 08/18/2013  . Dyspnea 08/16/2013  . Pleural effusion, right 08/16/2013  . Chronic systolic CHF (congestive heart failure) ([place]) 03/21/2013  . Recurrent right inguinal hernia 11/22/2012  . Intention tremor 11/01/2012  . Pleural effusion on right 07/06/2012  . Nonischemic cardiomyopathy ([place]) 09/20/2010  . PPM-[place] 11/27/2008  . CAD (coronary artery disease) 07/20/2008  . SICK SINUS SYNDROME 07/20/2008  . Polymyalgia rheumatica ([place]) 07/03/2008  . MIXED HEARING LOSS BILATERAL 01/10/2008  . Hyperlipidemia 03/16/2007  . Atrial fibrillation ([place]) 03/16/2007  . GERD 03/16/2007  . BPH (benign prostatic hyperplasia) 03/16/2007    Past Surgical History:  Procedure Laterality Date  . BREAST SURGERY    . CARDIAC CATHETERIZATION  1980's   [place]  . CARDIOVERSION  06/17/2011   Procedure: CARDIOVERSION;  Surgeon: Lelon Perla, MD;  Location: [facility];  Service: Cardiovascular;  Laterality: N/A;  . CARDIOVERSION  09/09/2011   Procedure: CARDIOVERSION;  Surgeon: Carlena Bjornstad, MD;  Location: [facility];  Service: Cardiovascular;  Laterality: N/A;  . CARDIOVERSION  01/01/2012  Procedure: CARDIOVERSION;  Surgeon: Hillary Bow, MD;  Location: [facility] ENDOSCOPY;  Service: Cardiovascular;  Laterality: N/A;  . CARDIOVERSION N/A 08/03/2014   Procedure: CARDIOVERSION;  Surgeon: Larey Dresser, MD;  Location: [facility] ENDOSCOPY;  Service: Cardiovascular;  Laterality: N/A;  . CARDIOVERSION N/A 11/07/2015   Procedure: CARDIOVERSION;  Surgeon: Larey Dresser, MD;  Location: [facility] ENDOSCOPY;  Service: Cardiovascular;  Laterality:  N/A;  . CARDIOVERSION N/A 11/20/2015   Procedure: CARDIOVERSION;  Surgeon: Larey Dresser, MD;  Location: [place];  Service: Cardiovascular;  Laterality: N/A;  . CATARACT EXTRACTION, BILATERAL Bilateral 1980's  . COLONOSCOPY N/A 01/03/2014   Procedure: COLONOSCOPY;  Surgeon: Gatha Mayer, MD;  Location: [facility] ENDOSCOPY;  Service: Endoscopy;  Laterality: N/A;  . ESOPHAGOGASTRODUODENOSCOPY    . GENERATOR REMOVAL Left 06/15/2012   Procedure: GENERATOR REMOVAL;  Surgeon: Evans Lance, MD;  Location: [place];  Service: Cardiovascular;  Laterality: Left;  . INGUINAL HERNIA REPAIR Right 2012; 03/20/2014  . INGUINAL HERNIA REPAIR Right 03/20/2014   Procedure: OPEN REPAIR OF RIGHT INGUINAL WITH MESH;  Surgeon: Donnie Mesa, MD;  Location: [place];  Service: General;  Laterality: Right;  . INSERT / REPLACE / REMOVE PACEMAKER    . INSERTION OF MESH Right 03/20/2014   Procedure: INSERTION OF MESH;  Surgeon: Donnie Mesa, MD;  Location: [place];  Service: General;  Laterality: Right;  . LEAD REVISION N/A 06/29/2012   Procedure: LEAD REVISION;  Surgeon: Deboraha Sprang, MD;  Location: [facility] CATH LAB;  Service: Cardiovascular;  Laterality: N/A;  . PACEMAKER GENERATOR CHANGE N/A 06/15/2012   Procedure: PACEMAKER GENERATOR CHANGE;  Surgeon: Evans Lance, MD;  Location: [place];  Service: Cardiovascular;  Laterality: N/A;  . PACEMAKER REVISION  06/30/2012   Procedure: PACEMAKER LEAD REVISION;  Surgeon: Evans Lance, MD;  Location: [facility] C[facility] LAB;  Service: Cardiovascular;;  . PARACENTESIS  ~ 0000000   complication from pacer change in 2014  . PERMANENT PACEMAKER INSERTION N/A 06/28/2012   Procedure: PERMANENT PACEMAKER INSERTION;  Surgeon: Evans Lance, MD;  Location: [facility] CATH LAB;  Service: Cardiovascular;  Laterality: N/A;  . TEE WITHOUT CARDIOVERSION  01/01/2012   Procedure: TRANSESOPHAGEAL ECHOCARDIOGRAM (TEE);  Surgeon: Fay Records, MD;  Location: [facility] ENDOSCOPY;  Service: Cardiovascular;  Laterality: N/A;  . TEE  WITHOUT CARDIOVERSION N/A 08/03/2014   Procedure: TRANSESOPHAGEAL ECHOCARDIOGRAM (TEE);  Surgeon: Larey Dresser, MD;  Location: [place];  Service: Cardiovascular;  Laterality: N/A;  . TEE WITHOUT CARDIOVERSION N/A 11/07/2015   Procedure: TRANSESOPHAGEAL ECHOCARDIOGRAM (TEE);  Surgeon: Larey Dresser, MD;  Location: [place];  Service: Cardiovascular;  Laterality: N/A;  . TONSILLECTOMY AND ADENOIDECTOMY  1936  . UMBILICAL HERNIA REPAIR  [phone] X 3       Home Medications    Prior to Admission medications   Medication Sig Start Date End Date Taking? Authorizing Provider  furosemide (LASIX) 40 MG tablet Take 1 tablet (40 mg total) by mouth 2 (two) times daily. 11/15/15   Sherran Needs, NP  losartan (COZAAR) 25 MG tablet 1/2 tablet (12.5mg  ) by mouth  daily 03/14/15   [name], MD  metoprolol succinate (TOPROL-XL) 25 MG 24 hr tablet Take 1 tablet (25 mg total) by mouth daily. Take with or immediately following a meal. 01/07/16 04/06/16  Larey Dresser, MD  potassium chloride SA (K-DUR,KLOR-CON) 20 MEQ tablet Take 1 tablet (20 mEq total) by mouth 2 (two) times daily. 11/15/15   Sherran Needs, NP  pravastatin (  PRAVACHOL) 80 MG tablet Take 0.5 tablets (40 mg total) by mouth daily. 03/14/15   [name], MD  spironolactone (ALDACTONE) 25 MG tablet Take 0.5 tablets (12.5 mg total) by mouth every evening. 03/14/15   [name], MD  tamsulosin (FLOMAX) 0.4 MG CAPS capsule TAKE 1 CAPSULE DAILY 02/10/16   [name], MD  warfarin (COUMADIN) 5 MG tablet TAKE 1/2 TO 1 TABLET (2.5 TO 5 MG TOTAL) DAILY AS DIRECTED BY ANTICOAGULATION CLINIC 03/14/15   [name], MD    Family History Family History  Problem Relation Age of Onset  . Heart attack Father 47    deceased  . AAA (abdominal aortic aneurysm) Mother     deceased AAA  . Hypertension Mother   . Other Brother     deceased at birth  . Healthy Son   . Healthy Daughter   . Diabetes Cousin     Social  History Social History  Substance Use Topics  . Smoking status: Former Smoker    Packs/day: 1.00    Years: 40.00    Types: Cigarettes    Quit date: 03/10/1983  . Smokeless tobacco: Never Used  . 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   All other systems reviewed and are negative.    Physical Exam Updated Vital Signs BP 107/96   Pulse 90   Resp 20   Wt 154 lb (69.9 kg)   SpO2 96%   BMI 24.86 kg/m   Physical Exam  Nursing note and vitals reviewed.  82 year old male, resting comfortably and in no acute distress. Vital signs are normal. Oxygen saturation is 96%, which is normal. Head is normocephalic and atraumatic. PERRLA, EOMI. Oropharynx is clear. Neck is nontender and supple without adenopathy or JVD. Back is nontender and there is no CVA tenderness. Lungs are clear without rales, wheezes, or rhonchi. Chest is nontender. Heart has An irregular rhythm without murmur. Abdomen is soft, flat, nontender without masses or hepatosplenomegaly and peristalsis is normoactive. Extremities have 1+ edema, full range of motion is present. Skin is warm and dry without rash. Neurologic: Mental status is normal, cranial nerves are intact, there are no motor or sensory deficits.  ED Treatments / Results  Labs (all labs ordered are listed, but only abnormal results are displayed) Labs Reviewed  BASIC METABOLIC PANEL - Abnormal; Notable for the following:       Result Value   BUN 27 (*)    GFR calc non Af Amer 52 (*)    GFR calc Af Amer 60 (*)    All other components within normal limits  BRAIN NATRIURETIC PEPTIDE - Abnormal; Notable for the following:    B Natriuretic Peptide 662.5 (*)    All other components within normal limits  CBC WITH DIFFERENTIAL/PLATELET - Abnormal; Notable for the  following:    RDW 16.3 (*)    All other components within normal limits  PROTIME-INR - Abnormal; Notable for the following:    Prothrombin Time 30.4 (*)    All other components within normal limits  TROPONIN I    EKG  EKG Interpretation  Date/Time:  Sunday February 23 2016 15:30:36 EST Ventricular Rate:  97 PR Interval:    QRS Duration: 153 QT Interval:  407 QTC Calculation: 517 R Axis:   -  91 Text Interpretation:  Atrial fibrillation Ventricular premature complex Right bundle branch block Inferior infarct, old Anterior infarct, age indeterminate Ventricular demand pacing When compared with ECG of 01/07/2016, Premature ventricular complexes are now Present Confirmed by [facility]  MD, Shawntina Diffee ([id number]) on 02/23/2016 3:38:06 PM       Radiology Dg Chest 2 View  Result Date: 02/23/2016 CLINICAL DATA:  SOB X several days; Dry cough X 3 days; Pacemaker; Has a hx of several cardioversions; CAD, CHF, A-fib EXAM: CHEST  2 VIEW COMPARISON:  11/15/2015 FINDINGS: Patient's left-sided transvenous pacemaker with leads to the right atrium, right ventricle, and coronary sinus. Heart is mildly enlarged. Bilateral costophrenic angle blunting appears stable. No new consolidations or pulmonary edema. IMPRESSION: 1. Stable mild cardiomegaly. 2. Stable bibasilar atelectasis and costophrenic angle blunting. Electronically Signed   By: Nolon Nations M.D.   On: 02/23/2016 16:29    Procedures Procedures (including critical care time)  Medications Ordered in ED Medications  acetaminophen (TYLENOL) tablet 650 mg (650 mg Oral Given 02/23/16 1930)  furosemide (LASIX) injection 40 mg (40 mg Intravenous Given 02/23/16 2013)     Initial Impression / Assessment and Plan / ED Course  I have reviewed the triage vital signs and the nursing notes.  Pertinent labs & imaging results that were available during my care of the patient were reviewed by me and considered in my medical decision making (see chart for  details).  Clinical Course    Exertional dyspnea worrisome for CHF exacerbation. Physical findings and supportive CHF include.peripheral edema, but JVD is not present. Old records are reviewed confirming long-standing history of atrial fibrillation which has been chronic since September. Screening labs are obtained. He is sent for chest x-ray.  Workup is consistent with CHF with elevated BNP. He is given a dose of furosemide with good diuresis. Findings, he is ambulated in the ED without any oxygen desaturation. He is feeling much better. He is discharged with instructions to double his furosemide dose for the next 3 days and follow-up with his PCP at that point.  Final Clinical Impressions(s) / ED Diagnoses   Final diagnoses:  Acute on chronic congestive heart failure, unspecified congestive heart failure type [facility])    New Prescriptions New Prescriptions   No medications on file     Delora Fuel, MD [id number]

## 2016-02-23 NOTE — Discharge Instructions (Signed)
Double your furosemide (Lasix) dose for the next three days. During those days, take two 40 mg tablets every morning for a total dose of 80 mg. After that, go back to taking one tablet (40 mg) a day.

## 2016-02-23 NOTE — ED Notes (Signed)
EDP at bedside  

## 2016-02-23 NOTE — ED Notes (Signed)
Ambulated patient O2 saturation was at 94 patient ambulated without occurrence of dizziness.

## 2016-02-23 NOTE — ED Notes (Addendum)
EDP at bedside  

## 2016-02-23 NOTE — ED Triage Notes (Signed)
Patient hx history of several cardioversion in the past. On oral meds for AFIB now. Patient has c/p SOB for a few days. Denies chest pain. 60-100s Afib on monitor/ 106/72, resp 16, 92% on room air, on nasal cannula on arrival. No hx of 02 use. Patient has had dry cough for last few days. No swelling. Lungs clear.

## 2016-02-24 ENCOUNTER — Encounter (HOSPITAL_COMMUNITY): Payer: Self-pay

## 2016-02-24 ENCOUNTER — Ambulatory Visit (HOSPITAL_COMMUNITY)
Admission: RE | Admit: 2016-02-24 | Discharge: 2016-02-24 | Disposition: A | Discharge status: Home / Self Care | Payer: Medicare Other | Source: Ambulatory Visit | Attending: Cardiology | Admitting: Cardiology

## 2016-02-24 VITALS — BP 92/58 | HR 87 | Wt 155.0 lb

## 2016-02-24 DIAGNOSIS — I480 Paroxysmal atrial fibrillation: Secondary | ICD-10-CM

## 2016-02-24 DIAGNOSIS — E7849 Other hyperlipidemia: Secondary | ICD-10-CM

## 2016-02-24 DIAGNOSIS — Z950 Presence of cardiac pacemaker: Secondary | ICD-10-CM | POA: Diagnosis not present

## 2016-02-24 DIAGNOSIS — I481 Persistent atrial fibrillation: Secondary | ICD-10-CM | POA: Insufficient documentation

## 2016-02-24 DIAGNOSIS — E784 Other hyperlipidemia: Secondary | ICD-10-CM | POA: Diagnosis not present

## 2016-02-24 DIAGNOSIS — R0602 Shortness of breath: Secondary | ICD-10-CM

## 2016-02-24 DIAGNOSIS — Z7901 Long term (current) use of anticoagulants: Secondary | ICD-10-CM | POA: Diagnosis not present

## 2016-02-24 DIAGNOSIS — E785 Hyperlipidemia, unspecified: Secondary | ICD-10-CM | POA: Diagnosis not present

## 2016-02-24 DIAGNOSIS — I2510 Atherosclerotic heart disease of native coronary artery without angina pectoris: Secondary | ICD-10-CM | POA: Diagnosis not present

## 2016-02-24 DIAGNOSIS — Z87891 Personal history of nicotine dependence: Secondary | ICD-10-CM | POA: Diagnosis not present

## 2016-02-24 DIAGNOSIS — I5022 Chronic systolic (congestive) heart failure: Secondary | ICD-10-CM | POA: Insufficient documentation

## 2016-02-24 DIAGNOSIS — Z79899 Other long term (current) drug therapy: Secondary | ICD-10-CM | POA: Insufficient documentation

## 2016-02-24 MED ORDER — METOPROLOL SUCCINATE ER 25 MG PO TB24: 12.5000 mg | ORAL_TABLET | Freq: Two times a day (BID) | ORAL | 3 refills | Status: DC

## 2016-02-24 MED ORDER — FUROSEMIDE 40 MG PO TABS: 40.0000 mg | ORAL_TABLET | Freq: Two times a day (BID) | ORAL | 2 refills | Status: DC

## 2016-02-24 NOTE — Patient Instructions (Signed)
Continue Lasix 80 mg Twice daily today, tomorrow and Wednesday. Starting on Thursday decrease to 40 mg Twice daily.  Can take an extra 40 mg as needed for weight greater than 152 lb  Change Toprol to 12.5 mg (1/2 tab) Twice daily   Labs on Friday  Your physician recommends that you schedule a follow-up appointment in: 2 weeks

## 2016-02-24 NOTE — Progress Notes (Signed)
Patient ID: [name], male   DOB: 06/14/1929, 82 y.o.   MRN: [id number] PCP: Dr. Yong [name] Cardiology: Dr. Aundra [name]  82 yo with history of paroxysmal atrial fibrillation, nonischemic cardiomyopathy, and complete heart block with St Jude CRT-P system presents for cardiology followup.  He was admitted in 4/14 with pacemaker pocket infection.  His pacemaker was removed and temporary-permanent device was placed.  Later, he had the temporary-permanent device removed and a new CRT-P device was placed. He developed dyspnea post-operatively and was found to have a large right-sided pleural effusion.  He was admitted and got a chest tube for drainage. He had followup with Dr. Servando [name] and it was decided that he would not need VATS.  He had been on amiodarone for maintenance of NSR.  This had been more successful than Tikosyn.   Around 3/15, he started developing increasing exertional dyspnea.  He was short of breath walking up a hill or incline.  He could still walk on the treadmill for exercise for 10-15 minutes and use the elliptical without much trouble on most days.  No orthopnea or PND.  No chest pain.  He saw Dr [name] and was started on Lasix three times a week due to concern for volume overload.  This did not seem to have helped much.  After that, he had PFTs done which showed normal spirometry but DLCO 50% predicted.  Therefore, amiodarone was stopped due to concern for lung toxicity.  He saw [name] for a pre-operative evaluation prior to right inguinal hernia repair.  Given the exertional dyspnea, he was set up for adenosine Cardiolite in 5/15.  This showed EF 36% with a small area of primarily scar in the apex, apical lateral wall, and mid anterolateral wall.  He has not felt palpitations. Echo in 5/15 showed EF 40-45%, similar to prior study.   I had him see pulmonary for evaluation for amiodarone lung toxicity.  CT chest looked ok, and he was not thought to have amiodarone lung toxicity.   At a  prior appointment in 2016, [name] was noted to be back in atrial fibrillation.  He was more short of breath with exertion and fatigued.  He has historically tolerated atrial fibrillation poorly.  I did a TEE-guided DCCV in 5/16 back to NSR.  TEE showed EF 35-40%.  He did not immediately feel better like he has in the past post-DCCV, so Lasix was increased.    Recurrent symptomatic atrial fibrillation in 8/17 with TEE-guided DCCV to NSR.  TEE showed EF 25-30%.  Recurrent symptomatic atrial fibrillation with CHF exacerbation in 9/17.  He had repeat DCCV and Lasix was increased.   He returns for follow up.  Last visit metoprolol stopped and Toprol XL started. On December 10th he reports  increased dyspnea. Last night he was seen in the ED for increased dyspnea. Mild volume overload and he was given one dose of IV lasix. He was then instructed to take lasix 80 mg twice a day for 3 days (double lasix). Today he is feeling a little better. + bendopnea.  Mild dyspnea with exertion. Denies PND/orthopnea. Weight at home 149-151 pounds. Has not been exercising in over the last week. Says he has had salty foods. Resides at [name].    Labs (7/13); LDL 65, HDL 38 Labs (3/14): TSH normal Labs (4/14):  ALT 61, AST 62 Labs (5/14): K 4.5, creatinine 0.9, BNP 727, digoxin 0.9 Labs (6/14): LDL 77, HDL 34, K 4, creatinine 0.9,  TSH normal Labs (10/14): digoxin 2.1, K 4.7, creatinine 1.0, TSH normal, LFTs normal Labs (3/15): K 4, creatinine 1.1 Labs (4/15): BNP 482 Labs (5/15): K 3.8, creatinine 1.2 Labs (6/15): K 4.3, creatinine 0.9 Labs (7/15): TSH normal, LFTs normal Labs (9/15): TSH normal, LFTs normal Labs (1/16): K 4.3, creatinine 1.07 Labs (5/16): TSH normal, LFTs normal, K 4.4, creatinine 1.02 Labs (6/16): K 4.4, creatinine 1.29 Labs (9/16): LDL 92, LFTs normal Labs (10/16): K 4.5, creatinine 1.07 Labs (3/17): TSH normal Labs (9/17): K 3.4, creatinine 1.32 => 1.31, BNP 837  => 639 Labs (02/23/2016): K 4.1 Creatinine 1.24 BNP 662   PMH: 1. Low back pain 2. Hyperlipidemia 3. GERD 4. BPH 5. Polymyalgia rheumatica 6. Atrial fibrillation: Failed Tikosyn in the past.  Paroxysmal, on amiodarone and warfarin.  DCCV in 4/13, 7/13, 10/13.  Has held NSR with amiodarone.  However, concern for amiodarone lung toxicity: PFTs (4/15) with FVC 90% predicted, FEV1 90%, ratio 97%, DLCO 50%.   Recurrence of atrial fibrillation in 5/16, TEE-guided DCCV back to NSR in 5/16.  - Recurrence of atrial fibrillation in 8/17. TEE-guided DCCV 11/07/15.  - Recurrence of atrial fibrillation 9/17.  DCCV 11/20/15 but back in atrial fibrillation by 11/26/15.  - Amiodarone stopped due to persistent atrial fibrillation.  7. Transaminitis: Mild, attributed to amiodarone.  8. Complete heart block s/p St Jude CRT-P device.  Patient developed PCM pocket infection in 4/14 and had his first system removed with placement of a temporary permanent PCM.  He later had CRT-P device replaced.  This was complicated by large right-sided hemorrhagic pleural effusion requiring chest tube.   9. Nonischemic cardiomyopathy: Echo (9/13) with EF 40-45%, diffuse HK, mild Craig.  Mild CAD only on prior LHC.  Echo (4/14) with EF 45-50%.  Adenosine Cardiolite (5/15) with EF 36% (visually appeared higher per report), small area of scar with peri-infarct ischemia in the apex, apical lateral wall, and mid anterolateral wall. Echo (5/15) with EF 40-45%, basal to mid inferolateral severe hypokinesis, basal to mid anterolateral hypokinesis, mildly dilated RV with mildly decreased RV systolic function, mild to moderate Craig.  TEE (5/16) with EF 35-40%, inferior and inferolateral severe hypokinesis, normal RV size with mildly decreased systolic function, moderate Craig, peak RV-RA gradient 26 mmHg.  - TEE (8/17): EF 25-30%, mildly dilated RV with mildly decreased systolic function, moderate biatrial enlargement, moderate central Craig.  10. Large right  pleural effusion requiring chest tube following removal and reimplantation of PCM. Most recent chest CT in 6/15 showed significant decrease in size of loculated pleural effusion since 5/14.    SH: Married, prior smoker (quit 1985), no ETOH x years, retired, lives at [facility] [facility]  [place]: Father with MI at 13, mother with AAA.    ROS: All systems reviewed and negative except as per HPI.   Current Outpatient Prescriptions  Medication Sig Dispense Refill  . furosemide (LASIX) 40 MG tablet Take 1 tablet (40 mg total) by mouth 2 (two) times daily. 180 tablet 2  . losartan (COZAAR) 25 MG tablet 1/2 tablet (12.5mg  ) by mouth  daily 135 tablet 3  . metoprolol succinate (TOPROL-XL) 25 MG 24 hr tablet Take 1 tablet (25 mg total) by mouth daily. Take with or immediately following a meal. 90 tablet 3  . potassium chloride SA (K-DUR,KLOR-CON) 20 MEQ tablet Take 1 tablet (20 mEq total) by mouth 2 (two) times daily. 180 tablet 2  . pravastatin (PRAVACHOL) 80 MG tablet Take 0.5 tablets (40 mg total) by mouth  daily. 45 tablet 3  . spironolactone (ALDACTONE) 25 MG tablet Take 0.5 tablets (12.5 mg total) by mouth every evening. 45 tablet 3  . tamsulosin (FLOMAX) 0.4 MG CAPS capsule TAKE 1 CAPSULE DAILY 90 capsule 3  . warfarin (COUMADIN) 5 MG tablet TAKE 1/2 TO 1 TABLET (2.5 TO 5 MG TOTAL) DAILY AS DIRECTED BY ANTICOAGULATION CLINIC (Patient taking differently: Take 2.5-5 mg by mouth every evening. Take 5mg  on Monday and Friday, all other days take 2.5mg .) 90 tablet 3   No current facility-administered medications for this encounter.     BP (!) 92/58   Pulse 87   Wt 155 lb (70.3 kg)   SpO2 99%   BMI 25.02 kg/m   Filed Weights   02/24/16 0956  Weight: 155 lb (70.3 kg)   General: NAD Neck: JVP ~10 cm, no thyromegaly or thyroid nodule.  Lungs: CTAB  CV: Nondisplaced PMI.  Heart irregular S1/S2, no S3/S4, no murmur.  No Traced RLE and LLE edema.  No carotid bruit.  Normal pedal pulses.  Abdomen: Soft,  nontender, no hepatosplenomegaly, + distended.   Neurologic: Alert and oriented x 3.  Psych: Normal affect. Extremities: No clubbing or cyanosis.   Assessment/Plan: 1. Chronic systolic CHF:  Presumed nonischemic cardiomyopathy (prior cath with only mild CAD).  He had an adenosine Cardiolite in 5/15 with EF 36% and possible small area of apical and lateral scar with peri-infarct ischemia. He has St Jude CRT-P.  TEE in 8/17 showed EF 25-30% . Symptomatically, he is doing much better on higher Lasix dose.  Suspect atrial fibrillation triggers CHF and will lead to higher Lasix requirement. NYHA class III. Mild volume overload. Continue lasix 80 mg twice a day x 3 days then back to lasix 40 mg twice a day. Also instructed to take extra 40 mg laisx for weight 152 pounds or greater.  - Continue current losartan and spironolactone.  No BP room for titration.  - Split Toprol XL to 12.5 mg twice a day.  On Firday check BMET and BNP. I have asked him to weight and record daily.  2. Atrial fibrillation: Now persistent.  Had breakthrough with Tikosyn and now not able to hold NSR on amiodarone. He is now off amiodarone.  Saw Dr. Lovena Le, decided on rate control strategy. - Continue warfarin.  3. Hyperlipidemia: History of nonobstructive CAD.  Continue pravastatin.    BMEt BNP on Friday morning.  Follow up in 2 weeks with Dr [name].  Amy Clegg NP-C  02/24/2016

## 2016-02-28 ENCOUNTER — Other Ambulatory Visit: Payer: Medicare Other | Admitting: *Deleted

## 2016-02-28 ENCOUNTER — Ambulatory Visit (INDEPENDENT_AMBULATORY_CARE_PROVIDER_SITE_OTHER): Payer: Medicare Other | Admitting: *Deleted

## 2016-02-28 DIAGNOSIS — Z5181 Encounter for therapeutic drug level monitoring: Secondary | ICD-10-CM | POA: Diagnosis not present

## 2016-02-28 DIAGNOSIS — I5022 Chronic systolic (congestive) heart failure: Secondary | ICD-10-CM

## 2016-02-28 DIAGNOSIS — I4891 Unspecified atrial fibrillation: Secondary | ICD-10-CM

## 2016-02-28 LAB — BASIC METABOLIC PANEL
ANION GAP: 8 (ref 5–15)
BUN: 29 mg/dL — AB (ref 6–20)
CALCIUM: 9.3 mg/dL (ref 8.9–10.3)
CO2: 26 mmol/L (ref 22–32)
CREATININE: 1.26 mg/dL — AB (ref 0.61–1.24)
Chloride: 104 mmol/L (ref 101–111)
GFR calc Af Amer: 59 mL/min — ABNORMAL LOW (ref >60)
GFR, EST NON AFRICAN AMERICAN: 51 mL/min — AB (ref >60)
Glucose, Bld: 120 mg/dL — ABNORMAL HIGH (ref 65–99)
Potassium: 4.1 mmol/L (ref 3.5–5.1)
Sodium: 138 mmol/L (ref 135–145)

## 2016-02-28 LAB — BRAIN NATRIURETIC PEPTIDE: B Natriuretic Peptide: 581 pg/mL — ABNORMAL HIGH (ref 0.0–100.0)

## 2016-02-28 LAB — POCT INR: INR: 3.7

## 2016-03-01 ENCOUNTER — Telehealth: Payer: Self-pay | Admitting: Cardiology

## 2016-03-01 NOTE — Telephone Encounter (Signed)
Pt was in [place] And out of coumadin.  I called in coumadin 5 mg to take as directed, #15 and 1 refill.

## 2016-03-06 ENCOUNTER — Ambulatory Visit (INDEPENDENT_AMBULATORY_CARE_PROVIDER_SITE_OTHER): Payer: Medicare Other | Admitting: Internal Medicine

## 2016-03-06 ENCOUNTER — Encounter: Payer: Self-pay | Admitting: Internal Medicine

## 2016-03-06 VITALS — BP 90/60 | HR 64 | Ht 66.0 in | Wt 153.0 lb

## 2016-03-06 DIAGNOSIS — K648 Other hemorrhoids: Secondary | ICD-10-CM

## 2016-03-06 NOTE — Patient Instructions (Signed)
  HEMORRHOID BANDING PROCEDURE    FOLLOW-UP CARE   1. The procedure you have had should have been relatively painless since the banding of the area involved does not have nerve endings and there is no pain sensation.  The rubber band cuts off the blood supply to the hemorrhoid and the band may fall off as soon as 48 hours after the banding (the band may occasionally be seen in the toilet bowl following a bowel movement). You may notice a temporary feeling of fullness in the rectum which should respond adequately to plain Tylenol or Motrin.  2. Following the banding, avoid strenuous exercise that evening and resume full activity the next day.  A sitz bath (soaking in a warm tub) or bidet is soothing, and can be useful for cleansing the area after bowel movements.     3. To avoid constipation, take two tablespoons of natural wheat bran, natural oat bran, flax, Benefiber or any over the counter fiber supplement and increase your water intake to 7-8 glasses daily.    4. Unless you have been prescribed anorectal medication, do not put anything inside your rectum for two weeks: No suppositories, enemas, fingers, etc.  5. Occasionally, you may have more bleeding than usual after the banding procedure.  This is often from the untreated hemorrhoids rather than the treated one.  Don't be concerned if there is a tablespoon or so of blood.  If there is more blood than this, lie flat with your bottom higher than your head and apply an ice pack to the area. If the bleeding does not stop within a half an hour or if you feel faint, call our office at [phone] or go to the emergency room.  6. Problems are not common; however, if there is a substantial amount of bleeding, severe pain, chills, fever or difficulty passing urine (very rare) or other problems, you should call us at [phone] or report to the nearest emergency room.  7. Do not stay seated continuously for more than 2-3 hours for a day or  two after the procedure.  Tighten your buttock muscles 10-15 times every two hours and take 10-15 deep breaths every 1-2 hours.  Do not spend more than a few minutes on the toilet if you cannot empty your bowel; instead re-visit the toilet at a later time.    RESTART YOUR WARFARIN ON January 4TH 2018.   Re-schedule your coumadin clinic appointment.   Call us back in 2 months if you feel you need more banding.    I appreciate the opportunity to care for you. Silvano Rusk, MD, [facility]

## 2016-03-06 NOTE — Progress Notes (Signed)
   Sxs: fecal soiling  Rectal: mildly decreased anal tone, mild, no mass and non-tender, NL anoderm  Anoscopy: Gr 2 inflamed internal hemorrhoids all 3 positions  PROCEDURE NOTE: The patient presents with symptomatic grade 2  hemorrhoids, requesting rubber band ligation of his/her hemorrhoidal disease.  All risks, benefits and alternative forms of therapy were described and informed consent was obtained.   The anorectum was pre-medicated with 0.125% NTG and 5% lidocaine The decision was made to band the all 3 columns of internal hemorrhoids, and the [place] was used to perform band ligation without complication.  Digital anorectal examination was then performed to assure proper positioning of the band, and to adjust the banded tissue as required.  The patient was discharged home without pain or other issues.  Dietary and behavioral recommendations were given and along with follow-up instructions.     The following adjunctive treatments were recommended:  Hold warfarin until 1/4 then restart and f/u anti-coag clinic following week. We had checked with Dr. Aundra Dubin re: holding warfarin and the patient was aware of extra risk of stroke off warfarin prior to doing this.  The patient will return as needed for  follow-up and possible additional banding as required. No complications were encountered and the patient tolerated the procedure well.

## 2016-03-10 ENCOUNTER — Encounter (HOSPITAL_COMMUNITY): Payer: Medicare Other

## 2016-03-11 ENCOUNTER — Ambulatory Visit (INDEPENDENT_AMBULATORY_CARE_PROVIDER_SITE_OTHER): Payer: Medicare Other | Admitting: *Deleted

## 2016-03-11 DIAGNOSIS — I495 Sick sinus syndrome: Secondary | ICD-10-CM

## 2016-03-12 ENCOUNTER — Telehealth (HOSPITAL_COMMUNITY): Payer: Self-pay | Admitting: *Deleted

## 2016-03-12 ENCOUNTER — Ambulatory Visit (INDEPENDENT_AMBULATORY_CARE_PROVIDER_SITE_OTHER): Payer: Medicare Other | Admitting: *Deleted

## 2016-03-12 DIAGNOSIS — I4891 Unspecified atrial fibrillation: Secondary | ICD-10-CM | POA: Diagnosis not present

## 2016-03-12 DIAGNOSIS — Z5181 Encounter for therapeutic drug level monitoring: Secondary | ICD-10-CM | POA: Diagnosis not present

## 2016-03-12 LAB — POCT INR: INR: 1.3

## 2016-03-12 NOTE — Telephone Encounter (Signed)
Patient called complaining of increased shortness of breath the past couple of days.  He has been taking his furosemide 40 mg as prescribed but didn't know he was able to take an extra tab with weight of 152 lb or greater.  He said his weight has been hanging around 152-155 but has increased or decreased.    He agreed to take an extra 40 mg of lasix and will call us if it doesn't help.  He has an appointment with [place] already on Monday.

## 2016-03-12 NOTE — Progress Notes (Signed)
Remote pacemaker transmission.   

## 2016-03-13 ENCOUNTER — Encounter: Payer: Self-pay | Admitting: Cardiology

## 2016-03-16 ENCOUNTER — Encounter (HOSPITAL_COMMUNITY): Payer: Self-pay

## 2016-03-16 ENCOUNTER — Ambulatory Visit (HOSPITAL_COMMUNITY)
Admission: RE | Admit: 2016-03-16 | Discharge: 2016-03-16 | Disposition: A | Discharge status: Home / Self Care | Payer: Medicare Other | Source: Ambulatory Visit | Attending: Internal Medicine | Admitting: Internal Medicine

## 2016-03-16 VITALS — BP 92/68 | HR 94 | Wt 155.6 lb

## 2016-03-16 DIAGNOSIS — I429 Cardiomyopathy, unspecified: Secondary | ICD-10-CM | POA: Insufficient documentation

## 2016-03-16 DIAGNOSIS — E784 Other hyperlipidemia: Secondary | ICD-10-CM

## 2016-03-16 DIAGNOSIS — Z79899 Other long term (current) drug therapy: Secondary | ICD-10-CM | POA: Insufficient documentation

## 2016-03-16 DIAGNOSIS — I5022 Chronic systolic (congestive) heart failure: Secondary | ICD-10-CM | POA: Diagnosis not present

## 2016-03-16 DIAGNOSIS — I4510 Unspecified right bundle-branch block: Secondary | ICD-10-CM | POA: Insufficient documentation

## 2016-03-16 DIAGNOSIS — E785 Hyperlipidemia, unspecified: Secondary | ICD-10-CM | POA: Diagnosis not present

## 2016-03-16 DIAGNOSIS — I480 Paroxysmal atrial fibrillation: Secondary | ICD-10-CM

## 2016-03-16 DIAGNOSIS — Z7901 Long term (current) use of anticoagulants: Secondary | ICD-10-CM | POA: Insufficient documentation

## 2016-03-16 DIAGNOSIS — Z950 Presence of cardiac pacemaker: Secondary | ICD-10-CM | POA: Diagnosis not present

## 2016-03-16 DIAGNOSIS — I442 Atrioventricular block, complete: Secondary | ICD-10-CM | POA: Insufficient documentation

## 2016-03-16 DIAGNOSIS — I2510 Atherosclerotic heart disease of native coronary artery without angina pectoris: Secondary | ICD-10-CM | POA: Insufficient documentation

## 2016-03-16 DIAGNOSIS — Z87891 Personal history of nicotine dependence: Secondary | ICD-10-CM | POA: Insufficient documentation

## 2016-03-16 DIAGNOSIS — E7849 Other hyperlipidemia: Secondary | ICD-10-CM

## 2016-03-16 DIAGNOSIS — I481 Persistent atrial fibrillation: Secondary | ICD-10-CM | POA: Insufficient documentation

## 2016-03-16 LAB — BASIC METABOLIC PANEL
Anion gap: 9 (ref 5–15)
BUN: 25 mg/dL — AB (ref 6–20)
CALCIUM: 9.7 mg/dL (ref 8.9–10.3)
CO2: 25 mmol/L (ref 22–32)
Chloride: 104 mmol/L (ref 101–111)
Creatinine, Ser: 1.26 mg/dL — ABNORMAL HIGH (ref 0.61–1.24)
GFR calc Af Amer: 59 mL/min — ABNORMAL LOW (ref >60)
GFR, EST NON AFRICAN AMERICAN: 51 mL/min — AB (ref >60)
Glucose, Bld: 98 mg/dL (ref 65–99)
Potassium: 4.3 mmol/L (ref 3.5–5.1)
Sodium: 138 mmol/L (ref 135–145)

## 2016-03-16 LAB — BRAIN NATRIURETIC PEPTIDE: B Natriuretic Peptide: 520.9 pg/mL — ABNORMAL HIGH (ref 0.0–100.0)

## 2016-03-16 LAB — MAGNESIUM: Magnesium: 2.1 mg/dL (ref 1.7–2.4)

## 2016-03-16 MED ORDER — FUROSEMIDE 40 MG PO TABS: ORAL_TABLET | ORAL | 6 refills | Status: DC

## 2016-03-16 NOTE — Progress Notes (Signed)
Patient ID: [name], male   DOB: [birth year], 83 y.o.   MRN: [id number] PCP: Dr. Yong Channel Cardiology: Dr. Aundra Dubin  83 yo with history of paroxysmal atrial fibrillation, nonischemic cardiomyopathy, and complete heart block with St Jude CRT-P system presents for cardiology followup.  He was admitted in 4/14 with pacemaker pocket infection.  His pacemaker was removed and temporary-permanent device was placed.  Later, he had the temporary-permanent device removed and a new CRT-P device was placed. He developed dyspnea post-operatively and was found to have a large right-sided pleural effusion.  He was admitted and got a chest tube for drainage. He had followup with Dr. Servando Snare and it was decided that he would not need VATS.  He had been on amiodarone for maintenance of NSR.  This had been more successful than Tikosyn.   Around 3/15, he started developing increasing exertional dyspnea.  He was short of breath walking up a hill or incline.  He could still walk on the treadmill for exercise for 10-15 minutes and use the elliptical without much trouble on most days.  No orthopnea or PND.  No chest pain.  He saw Dr Leanne Chang and was started on Lasix three times a week due to concern for volume overload.  This did not seem to have helped much.  After that, he had PFTs done which showed normal spirometry but DLCO 50% predicted.  Therefore, amiodarone was stopped due to concern for lung toxicity.  He saw Richardson Dopp for a pre-operative evaluation prior to right inguinal hernia repair.  Given the exertional dyspnea, he was set up for adenosine Cardiolite in 5/15.  This showed EF 36% with a small area of primarily scar in the apex, apical lateral wall, and mid anterolateral wall.  He has not felt palpitations. Echo in 5/15 showed EF 40-45%, similar to prior study.   Saw pulmonary for evaluation for amiodarone lung toxicity.  CT chest looked ok, and he was not thought to have amiodarone lung toxicity.   At a prior  appointment in 2016, [name] was noted to be back in atrial fibrillation.  He was more short of breath with exertion and fatigued.  He has historically tolerated atrial fibrillation poorly.  I did a TEE-guided DCCV in 5/16 back to NSR.  TEE showed EF 35-40%.  He did not immediately feel better like he has in the past post-DCCV, so Lasix was increased.    Recurrent symptomatic atrial fibrillation in 8/17 with TEE-guided DCCV to NSR.  TEE showed EF 25-30%.  Recurrent symptomatic atrial fibrillation with CHF exacerbation in 9/17.  He had repeat DCCV and Lasix was increased.   He returns today for regular follow up.  Last week took extra lasix and has been feeling better, but still feels down overall. Taking lasix 40 mg q am and 80 mg q pm. Generally just feels tired and SOB. SOB with any exertion he says.  + Bendopnea. Not exercising anymore with not feeling as good. Weight at home stable 150-152 lbs. Has been having some chest heaviness with his SOB, and also having tachypalpitations. Having a lot of restless legs, having muscle spasms and "cold feet". Resides at [facility]. Mild peripheral edema. SOB changing clothes and bathing. Has had some lightheadedness and dizziness.   Labs (7/13); LDL 65, HDL 38 Labs (3/14): TSH normal Labs (4/14):  ALT 61, AST 62 Labs (5/14): K 4.5, creatinine 0.9, BNP 727, digoxin 0.9 Labs (6/14): LDL 77, HDL 34, K 4, creatinine 0.9,  TSH normal Labs (10/14): digoxin 2.1, K 4.7, creatinine 1.0, TSH normal, LFTs normal Labs (3/15): K 4, creatinine 1.1 Labs (4/15): BNP 482 Labs (5/15): K 3.8, creatinine 1.2 Labs (6/15): K 4.3, creatinine 0.9 Labs (7/15): TSH normal, LFTs normal Labs (9/15): TSH normal, LFTs normal Labs (1/16): K 4.3, creatinine 1.07 Labs (5/16): TSH normal, LFTs normal, K 4.4, creatinine 1.02 Labs (6/16): K 4.4, creatinine 1.29 Labs (9/16): LDL 92, LFTs normal Labs (10/16): K 4.5, creatinine 1.07 Labs (3/17): TSH normal Labs  (9/17): K 3.4, creatinine 1.32 => 1.31, BNP 837 => 639 Labs (02/23/2016): K 4.1 Creatinine 1.24 BNP 662   PMH: 1. Low back pain 2. Hyperlipidemia 3. GERD 4. BPH 5. Polymyalgia rheumatica 6. Atrial fibrillation: Failed Tikosyn in the past.  Paroxysmal, on amiodarone and warfarin.  DCCV in 4/13, 7/13, 10/13.  Has held NSR with amiodarone.  However, concern for amiodarone lung toxicity: PFTs (4/15) with FVC 90% predicted, FEV1 90%, ratio 97%, DLCO 50%.   Recurrence of atrial fibrillation in 5/16, TEE-guided DCCV back to NSR in 5/16.  - Recurrence of atrial fibrillation in 8/17. TEE-guided DCCV 11/07/15.  - Recurrence of atrial fibrillation 9/17.  DCCV 11/20/15 but back in atrial fibrillation by 11/26/15.  - Amiodarone stopped due to persistent atrial fibrillation.  7. Transaminitis: Mild, attributed to amiodarone.  8. Complete heart block s/p St Jude CRT-P device.  Patient developed PCM pocket infection in 4/14 and had his first system removed with placement of a temporary permanent PCM.  He later had CRT-P device replaced.  This was complicated by large right-sided hemorrhagic pleural effusion requiring chest tube.   9. Nonischemic cardiomyopathy: Echo (9/13) with EF 40-45%, diffuse HK, mild Craig.  Mild CAD only on prior LHC.  Echo (4/14) with EF 45-50%.  Adenosine Cardiolite (5/15) with EF 36% (visually appeared higher per report), small area of scar with peri-infarct ischemia in the apex, apical lateral wall, and mid anterolateral wall. Echo (5/15) with EF 40-45%, basal to mid inferolateral severe hypokinesis, basal to mid anterolateral hypokinesis, mildly dilated RV with mildly decreased RV systolic function, mild to moderate Craig.  TEE (5/16) with EF 35-40%, inferior and inferolateral severe hypokinesis, normal RV size with mildly decreased systolic function, moderate Craig, peak RV-RA gradient 26 mmHg.  - TEE (8/17): EF 25-30%, mildly dilated RV with mildly decreased systolic function, moderate biatrial  enlargement, moderate central Craig.  10. Large right pleural effusion requiring chest tube following removal and reimplantation of PCM. Most recent chest CT in 6/15 showed significant decrease in size of loculated pleural effusion since 5/14.    SH: Married, prior smoker (quit 1985), no ETOH x years, retired, lives at [employer]  [place]: Father with MI at 29, mother with AAA.    ROS: All systems reviewed and negative except as per HPI.   Current Outpatient Prescriptions  Medication Sig Dispense Refill  . furosemide (LASIX) 40 MG tablet Take 1 tablet (40 mg total) by mouth 2 (two) times daily. Take extra tab for weight 152 lb or greater 180 tablet 2  . losartan (COZAAR) 25 MG tablet 1/2 tablet (12.5mg  ) by mouth  daily 135 tablet 3  . metoprolol succinate (TOPROL-XL) 25 MG 24 hr tablet Take 0.5 tablets (12.5 mg total) by mouth 2 (two) times daily. Take with or immediately following a meal. 90 tablet 3  . potassium chloride SA (K-DUR,KLOR-CON) 20 MEQ tablet Take 1 tablet (20 mEq total) by mouth 2 (two) times daily. 180 tablet 2  . pravastatin (  PRAVACHOL) 80 MG tablet Take 0.5 tablets (40 mg total) by mouth daily. 45 tablet 3  . spironolactone (ALDACTONE) 25 MG tablet Take 0.5 tablets (12.5 mg total) by mouth every evening. 45 tablet 3  . tamsulosin (FLOMAX) 0.4 MG CAPS capsule TAKE 1 CAPSULE DAILY 90 capsule 3  . warfarin (COUMADIN) 5 MG tablet TAKE 1/2 TO 1 TABLET (2.5 TO 5 MG TOTAL) DAILY AS DIRECTED BY ANTICOAGULATION CLINIC 90 tablet 3   No current facility-administered medications for this encounter.     BP 92/68 (BP Location: Left Arm, Patient Position: Sitting, Cuff Size: Normal)   Pulse 94   Wt 155 lb 9.6 oz (70.6 kg)   SpO2 96%   BMI 25.11 kg/m    Wt Readings from Last 3 Encounters:  03/16/16 155 lb 9.6 oz (70.6 kg)  03/06/16 153 lb (69.4 kg)  02/24/16 155 lb (70.3 kg)    General: NAD Neck: JVP ~9-10 cm, No thyromegaly or thyroid nodule. No carotid bruit.  Lungs: Clear, normal  effort.  CV: Nondisplaced PMI.  Heart irregular S1/S2, no S3/S4, no murmur.   Abdomen: Soft, NT, mildly distended, no HSM. No bruits or masses. +BS  Neurologic: Alert and oriented x 3.  Psych: Normal affect. Extremities: No clubbing or cyanosis. Trace ankle edema at most.   EKG- Undetermined rhythm. - Appears to be Afib 91 bpm.   Assessment/Plan: 1. Chronic systolic CHF:  Presumed nonischemic cardiomyopathy (prior cath with only mild CAD).  He had an adenosine Cardiolite in 5/15 with EF 36% and possible small area of apical and lateral scar with peri-infarct ischemia. He has St Jude CRT-P.  TEE in 8/17 showed EF 25-30% . Symptomatically, he is doing much better on higher Lasix dose.  Suspect atrial fibrillation triggers CHF and will lead to higher Lasix requirement. - NYHA class III-IIIb, suspect in setting of paroxysmal worsening of his Afib/rate.  - Continue lasix 80 mg q am and 40 mg q pm.  Knows to take extra as needed for weight gain or drop back to 40 mg BID with weight loss/lightheadedness.  BMET/BNP today.  - Continue losartan 12.5 mg daily - Continue spironolactone 12.5 mg daily.    - Continue Toprol XL 12.5 mg twice a day. No room to uptirate with soft pressures.  - Reinforced fluid restriction to < 2 L daily, sodium restriction to less than 2000 mg daily, and the importance of daily weights.   2. Atrial fibrillation, Persistent - Off tikosyn and amio with recurrence - Sees Dr. Lovena Le. Will have him follow up with worsening symptoms.  - Continue warfarin.  3. Hyperlipidemia:  - Continue pravastatin 40 mg daily  Discussed case with Dr. Aundra Dubin. Will involve Dr. Lovena Le for further rhythm/rate control consideration. Will have follow up with EP and with MD at next visit. Labs today. Continue higher dose lasix for now.   Shirley Friar, PA-C  03/16/2016   Total time spent > 25 minutes. Over half that spent discussing the above.

## 2016-03-16 NOTE — Patient Instructions (Addendum)
Routine lab work today. Will notify you of abnormal results, otherwise no news is good news!  Take Lasix 80 mg (2 tabs) in am and 40 mg (1 tab) in pm.  Follow up with Dr. Lovena Le for irregular heart rhythm/rate control. Address: [address] ([place]), [place], [place] [address]  Phone: [phone]  Follow up with Dr. Aundra Dubin in 2-4 weeks.  Do the following things EVERYDAY: 1) Weigh yourself in the morning before breakfast. Write it down and keep it in a log. 2) Take your medicines as prescribed 3) Eat low salt foods-Limit salt (sodium) to 2000 mg per day.  4) Stay as active as you can everyday 5) Limit all fluids for the day to less than 2 liters

## 2016-03-19 ENCOUNTER — Ambulatory Visit (INDEPENDENT_AMBULATORY_CARE_PROVIDER_SITE_OTHER): Payer: Medicare Other | Admitting: *Deleted

## 2016-03-19 DIAGNOSIS — Z5181 Encounter for therapeutic drug level monitoring: Secondary | ICD-10-CM | POA: Diagnosis not present

## 2016-03-19 DIAGNOSIS — I4891 Unspecified atrial fibrillation: Secondary | ICD-10-CM | POA: Diagnosis not present

## 2016-03-19 LAB — POCT INR: INR: 3.1

## 2016-03-23 ENCOUNTER — Encounter (HOSPITAL_COMMUNITY): Payer: Medicare Other

## 2016-03-23 ENCOUNTER — Telehealth: Payer: Self-pay | Admitting: Family Medicine

## 2016-03-23 NOTE — Telephone Encounter (Signed)
° ° ° ° °  Pt is now with [facility] and need new scripts for all of his meds to be sent to them     [place]

## 2016-03-23 NOTE — Telephone Encounter (Signed)
Dr. Hunter's pt. 

## 2016-03-24 ENCOUNTER — Other Ambulatory Visit: Payer: Self-pay

## 2016-03-24 DIAGNOSIS — I5022 Chronic systolic (congestive) heart failure: Secondary | ICD-10-CM

## 2016-03-24 DIAGNOSIS — I480 Paroxysmal atrial fibrillation: Secondary | ICD-10-CM

## 2016-03-24 MED ORDER — PRAVASTATIN SODIUM 80 MG PO TABS: 2.0000 mg | ORAL_TABLET | Freq: Every day | ORAL | 3 refills | Status: DC

## 2016-03-24 MED ORDER — SPIRONOLACTONE 25 MG PO TABS: 12.5000 mg | ORAL_TABLET | Freq: Every evening | ORAL | 3 refills | Status: DC

## 2016-03-24 MED ORDER — WARFARIN SODIUM 5 MG PO TABS: ORAL_TABLET | ORAL | 3 refills | Status: DC

## 2016-03-24 MED ORDER — LOSARTAN POTASSIUM 25 MG PO TABS: ORAL_TABLET | ORAL | 3 refills | Status: DC

## 2016-03-24 MED ORDER — TAMSULOSIN HCL 0.4 MG PO CAPS: 0.4000 mg | ORAL_CAPSULE | Freq: Every day | ORAL | 3 refills | Status: DC

## 2016-03-24 NOTE — Telephone Encounter (Signed)
Prescriptions sent to [facility] as requested. Cozaar, Pravastatin, Flomax, Warfarin,Spironolactone

## 2016-03-24 NOTE — Telephone Encounter (Signed)
° °  Resend to

## 2016-03-25 ENCOUNTER — Other Ambulatory Visit (HOSPITAL_COMMUNITY): Payer: Self-pay | Admitting: Pharmacist

## 2016-03-25 DIAGNOSIS — I480 Paroxysmal atrial fibrillation: Secondary | ICD-10-CM

## 2016-03-25 MED ORDER — FUROSEMIDE 40 MG PO TABS: ORAL_TABLET | ORAL | 3 refills | Status: DC

## 2016-03-28 LAB — CUP PACEART REMOTE DEVICE CHECK
Brady Statistic AP VP Percent: 34 %
Brady Statistic AP VS Percent: 6.5 %
Brady Statistic AS VP Percent: 50 %
Brady Statistic RA Percent Paced: 5 %
Implantable Lead Implant Date: 20140424
Implantable Lead Location: 753858
Implantable Lead Location: 753859
Implantable Lead Model: 5076
Lead Channel Impedance Value: 450 Ohm
Lead Channel Impedance Value: 690 Ohm
Lead Channel Pacing Threshold Amplitude: 1 V
Lead Channel Pacing Threshold Pulse Width: 0.4 ms
Lead Channel Pacing Threshold Pulse Width: 0.5 ms
Lead Channel Sensing Intrinsic Amplitude: 7.4 mV
Lead Channel Setting Pacing Amplitude: 2 V
Lead Channel Setting Pacing Pulse Width: 0.5 ms
Lead Channel Setting Sensing Sensitivity: 2.5 mV
MDC IDC LEAD IMPLANT DT: 20140422
MDC IDC LEAD IMPLANT DT: 20140424
MDC IDC LEAD LOCATION: 753860
MDC IDC MSMT BATTERY REMAINING LONGEVITY: 103 mo
MDC IDC MSMT BATTERY REMAINING PERCENTAGE: 95.5 %
MDC IDC MSMT BATTERY VOLTAGE: 2.96 V
MDC IDC MSMT LEADCHNL RA IMPEDANCE VALUE: 440 Ohm
MDC IDC MSMT LEADCHNL RA PACING THRESHOLD AMPLITUDE: 0.5 V
MDC IDC MSMT LEADCHNL RA PACING THRESHOLD PULSEWIDTH: 0.5 ms
MDC IDC MSMT LEADCHNL RA SENSING INTR AMPL: 0.7 mV
MDC IDC MSMT LEADCHNL RV PACING THRESHOLD AMPLITUDE: 1 V
MDC IDC PG IMPLANT DT: 20140422
MDC IDC PG SERIAL: 2926516
MDC IDC SESS DTM: 20180103190948
MDC IDC SET LEADCHNL LV PACING AMPLITUDE: 2 V
MDC IDC SET LEADCHNL RV PACING AMPLITUDE: 2.5 V
MDC IDC SET LEADCHNL RV PACING PULSEWIDTH: 0.4 ms
MDC IDC STAT BRADY AS VS PERCENT: 4.5 %

## 2016-03-30 ENCOUNTER — Telehealth (HOSPITAL_COMMUNITY): Payer: Self-pay | Admitting: *Deleted

## 2016-03-30 MED ORDER — METOPROLOL SUCCINATE ER 25 MG PO TB24: 12.5000 mg | ORAL_TABLET | Freq: Two times a day (BID) | ORAL | 3 refills | Status: DC

## 2016-03-30 NOTE — Telephone Encounter (Signed)
Refill request for Metoprolol

## 2016-03-31 ENCOUNTER — Encounter: Payer: Self-pay | Admitting: Internal Medicine

## 2016-03-31 ENCOUNTER — Ambulatory Visit (INDEPENDENT_AMBULATORY_CARE_PROVIDER_SITE_OTHER): Payer: Medicare Other | Admitting: Internal Medicine

## 2016-03-31 ENCOUNTER — Ambulatory Visit (INDEPENDENT_AMBULATORY_CARE_PROVIDER_SITE_OTHER): Payer: Medicare Other | Admitting: *Deleted

## 2016-03-31 VITALS — BP 102/58 | HR 100 | Ht 66.0 in | Wt 150.8 lb

## 2016-03-31 DIAGNOSIS — Z5181 Encounter for therapeutic drug level monitoring: Secondary | ICD-10-CM

## 2016-03-31 DIAGNOSIS — Z01812 Encounter for preprocedural laboratory examination: Secondary | ICD-10-CM

## 2016-03-31 DIAGNOSIS — Z950 Presence of cardiac pacemaker: Secondary | ICD-10-CM

## 2016-03-31 DIAGNOSIS — I4891 Unspecified atrial fibrillation: Secondary | ICD-10-CM | POA: Diagnosis not present

## 2016-03-31 DIAGNOSIS — I5022 Chronic systolic (congestive) heart failure: Secondary | ICD-10-CM

## 2016-03-31 LAB — POCT INR: INR: 3.6

## 2016-03-31 NOTE — Patient Instructions (Addendum)
Medication Instructions:  Your physician recommends that you continue on your current medications as directed. Please refer to the Current Medication list given to you today.   Labwork: None Ordered   Testing/Procedures: Your physician has recommended that you have an ablation. Catheter ablation is a medical procedure used to treat some cardiac arrhythmias (irregular heartbeats). During catheter ablation, a long, thin, flexible tube is put into a blood vessel in your groin (upper thigh), or neck. This tube is called an ablation catheter. It is then guided to your heart through the blood vessel. Radio frequency waves destroy small areas of heart tissue where abnormal heartbeats may cause an arrhythmia to start. Please see the instruction sheet given to you today.     Follow-Up: Medication Instructions:  Please continue all medications as listed.  Labwork:   Lab appointment for BMET/CBC/INR 1-2 weeks prior to procedure  Testing/Procedures: Your physician has recommended that you have an ablation. Catheter ablation is a medical procedure used to treat some cardiac arrhythmias (irregular heartbeats). During catheter ablation, a long, thin, flexible tube is put into a blood vessel in your groin (upper thigh), or neck. This tube is called an ablation catheter. It is then guided to your heart through the blood vessel. Radio frequency waves destroy small areas of heart tissue where abnormal heartbeats may cause an arrhythmia to start. Please see the instruction sheet given to you today.  Follow-Up: Follow-up appointment and 4 weeks with Dr. Lovena Le.   Any Other Special Instructions Will Be Listed Below (If Applicable).  HOLD Warfarin 2 days prior to ablation (last dose on 04/17/16)  Please report to the [facility] on  04/20/16  at  5:30 AM  Nothing to eat or drink after midnight prior to procedure  Do not take any medication prior to procedure  Plan 1  night stay     If you need a refill on your cardiac medications before your next appointment, please call your pharmacy.     Any Other Special Instructions Will Be Listed Below (If Applicable).     If you need a refill on your cardiac medications before your next appointment, please call your pharmacy.

## 2016-03-31 NOTE — Progress Notes (Signed)
HPI [name] returns today for followup. He is a very pleasant 83 year old man with chronic systolic heart failure, symptomatic bradycardia, high-grade conduction system disease, who developed a pacemaker pocket infection and underwent extraction, initial insertion of a temporary permanent pacemaker, followed by insertion of a biventricular pacemaker. His procedure was complicated by atrial lead perforation x2, resulting in a pleural effusion.  No syncope or palpitations. He underwent DCCV but has reverted back to atrial fib. He had developed amio lung toxicity previously and Tikosyn before that was ineffective. He feels like his heart rate is not well controlled. His main complaint is that he is sob.  Allergies  Allergen Reactions  . Antihistamines, Diphenhydramine-Type Other (See Comments)    Causes difficulty in ability to urinate.     Current Outpatient Prescriptions  Medication Sig Dispense Refill  . furosemide (LASIX) 40 MG tablet Take 40 mg by mouth 2 (two) times daily.    Marland Kitchen losartan (COZAAR) 25 MG tablet 1/2 tablet (12.5mg  ) by mouth  daily 135 tablet 3  . metoprolol succinate (TOPROL-XL) 25 MG 24 hr tablet Take 25 mg by mouth daily.    . potassium chloride SA (K-DUR,KLOR-CON) 20 MEQ tablet Take 1 tablet (20 mEq total) by mouth 2 (two) times daily. 180 tablet 2  . pravastatin (PRAVACHOL) 80 MG tablet Take 0.5 tablets (40 mg total) by mouth daily. 45 tablet 3  . spironolactone (ALDACTONE) 25 MG tablet Take 0.5 tablets (12.5 mg total) by mouth every evening. 45 tablet 3  . tamsulosin (FLOMAX) 0.4 MG CAPS capsule Take 1 capsule (0.4 mg total) by mouth daily. 90 capsule 3  . warfarin (COUMADIN) 5 MG tablet TAKE 1/2 TO 1 TABLET (2.5 TO 5 MG TOTAL) DAILY AS DIRECTED BY ANTICOAGULATION CLINIC 90 tablet 3   No current facility-administered medications for this visit.      Past Medical History:  Diagnosis Date  . Arthritis    "back" (03/20/2014)  . Atrial fibrillation ([place])   . BPH  (benign prostatic hypertrophy)   . Breast mass    "on both sides" (06/28/2012)  . Cardiomyopathy primary-nonischemic EF 45%  . CHF (congestive heart failure) ([place])   . Coronary artery disease   . Diverticulosis of colon without hemorrhage 01/03/2014  . Dysphagia   . Elevated liver enzymes   . Esophageal stricture   . GERD (gastroesophageal reflux disease)   . Hearing loss, mixed, bilateral   . Hx of cardiovascular stress test    Adenosine Myoview (07/2013):  Apical cap, apical lateral and mid anterolateral scar, small amount of peri-infarct ischemia, EF 36%; Medium Risk  . Hyperlipidemia   . Increased prostate specific antigen (PSA) velocity   . Internal hemorrhoids 01/03/2014  . Lumbar back pain   . Pacemaker    st jude  . Pacemaker infection ([place])    06/28/12  . Polymyalgia rheumatica ([place])   . Sick sinus syndrome ([facility])     ROS:   All systems reviewed and negative except as noted in the HPI.   Past Surgical History:  Procedure Laterality Date  . BREAST SURGERY    . CARDIAC CATHETERIZATION  1980's   [place]  . CARDIOVERSION  06/17/2011   Procedure: CARDIOVERSION;  Surgeon: Lelon Perla, MD;  Location: [facility];  Service: Cardiovascular;  Laterality: N/A;  . CARDIOVERSION  09/09/2011   Procedure: CARDIOVERSION;  Surgeon: Carlena Bjornstad, MD;  Location: [place];  Service: Cardiovascular;  Laterality: N/A;  . CARDIOVERSION  01/01/2012   Procedure: CARDIOVERSION;  Surgeon: Hillary Bow, MD;  Location: [facility] ENDOSCOPY;  Service: Cardiovascular;  Laterality: N/A;  . CARDIOVERSION N/A 08/03/2014   Procedure: CARDIOVERSION;  Surgeon: Larey Dresser, MD;  Location: [facility] ENDOSCOPY;  Service: Cardiovascular;  Laterality: N/A;  . CARDIOVERSION N/A 11/07/2015   Procedure: CARDIOVERSION;  Surgeon: Larey Dresser, MD;  Location: [facility] ENDOSCOPY;  Service: Cardiovascular;  Laterality: N/A;  . CARDIOVERSION N/A 11/20/2015   Procedure: CARDIOVERSION;  Surgeon: Larey Dresser, MD;  Location: [place];  Service: Cardiovascular;  Laterality: N/A;  . CATARACT EXTRACTION, BILATERAL Bilateral 1980's  . COLONOSCOPY N/A 01/03/2014   Procedure: COLONOSCOPY;  Surgeon: Gatha Mayer, MD;  Location: [facility] ENDOSCOPY;  Service: Endoscopy;  Laterality: N/A;  . ESOPHAGOGASTRODUODENOSCOPY    . GENERATOR REMOVAL Left 06/15/2012   Procedure: GENERATOR REMOVAL;  Surgeon: Evans Lance, MD;  Location: [place];  Service: Cardiovascular;  Laterality: Left;  . INGUINAL HERNIA REPAIR Right 2012; 03/20/2014  . INGUINAL HERNIA REPAIR Right 03/20/2014   Procedure: OPEN REPAIR OF RIGHT INGUINAL WITH MESH;  Surgeon: Donnie Mesa, MD;  Location: [place];  Service: General;  Laterality: Right;  . INSERT / REPLACE / REMOVE PACEMAKER    . INSERTION OF MESH Right 03/20/2014   Procedure: INSERTION OF MESH;  Surgeon: Donnie Mesa, MD;  Location: [place];  Service: General;  Laterality: Right;  . LEAD REVISION N/A 06/29/2012   Procedure: LEAD REVISION;  Surgeon: Deboraha Sprang, MD;  Location: [facility] CATH LAB;  Service: Cardiovascular;  Laterality: N/A;  . PACEMAKER GENERATOR CHANGE N/A 06/15/2012   Procedure: PACEMAKER GENERATOR CHANGE;  Surgeon: Evans Lance, MD;  Location: [place];  Service: Cardiovascular;  Laterality: N/A;  . PACEMAKER REVISION  06/30/2012   Procedure: PACEMAKER LEAD REVISION;  Surgeon: Evans Lance, MD;  Location: [facility] C[facility] LAB;  Service: Cardiovascular;;  . PARACENTESIS  ~ [phone]   complication from pacer change in 2014  . PERMANENT PACEMAKER INSERTION N/A 06/28/2012   Procedure: PERMANENT PACEMAKER INSERTION;  Surgeon: Evans Lance, MD;  Location: [facility] CATH LAB;  Service: Cardiovascular;  Laterality: N/A;  . TEE WITHOUT CARDIOVERSION  01/01/2012   Procedure: TRANSESOPHAGEAL ECHOCARDIOGRAM (TEE);  Surgeon: Fay Records, MD;  Location: [facility] ENDOSCOPY;  Service: Cardiovascular;  Laterality: N/A;  . TEE WITHOUT CARDIOVERSION N/A 08/03/2014   Procedure: TRANSESOPHAGEAL ECHOCARDIOGRAM (TEE);  Surgeon: Larey Dresser, MD;  Location: [place];  Service: Cardiovascular;  Laterality: N/A;  . TEE WITHOUT CARDIOVERSION N/A 11/07/2015   Procedure: TRANSESOPHAGEAL ECHOCARDIOGRAM (TEE);  Surgeon: Larey Dresser, MD;  Location: [place];  Service: Cardiovascular;  Laterality: N/A;  . TONSILLECTOMY AND ADENOIDECTOMY  1938  . UMBILICAL HERNIA REPAIR  [phone] X 3     Family History  Problem Relation Age of Onset  . Heart attack Father 15    deceased  . AAA (abdominal aortic aneurysm) Mother     deceased AAA  . Hypertension Mother   . Other Brother     deceased at birth  . Healthy Son   . Healthy Daughter   . Diabetes Cousin      Social History   Social History  . Marital status: Married    Spouse name: N/A  . Number of children: 3  . Years of education: N/A   Occupational History  . retired Retired    Engineering geologist   Social History Main Topics  . Smoking status: Former Smoker    Packs/day: 1.00    Years: 40.00  Types: Cigarettes    Quit date: 03/10/1983  . Smokeless tobacco: Never Used  . 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 Drug use: No  . Sexual activity: Not Currently   Other Topics Concern  . Not on file   Social History Narrative   Married. 3 children (6 together). 5 grandkids with with 5 grandkids (2nd marriage). No greatgrandkids.    One story home.   Retired from Education officer, community: college   Hobbies: play golf, bridge, garden, write family stories     BP (!) 102/58   Pulse 100   Ht 5\' 6"  (1.676 m)   Wt 150 lb 12.8 oz (68.4 kg)   BMI 24.34 kg/m   Physical Exam:  stable appearing elderly man, NAD HEENT: Unremarkable Neck:  7 cm JVD, no thyromegally Back:  No CVA tenderness Lungs:  Clear with no wheezes, rales, or rhonchi. Well-healed pacemaker incision. Well-healed extraction incision. HEART:  IRegular rate rhythm, no murmurs, no rubs, no clicks Abd:   soft, positive bowel sounds, no organomegally, no rebound, no guarding Ext:  2 plus pulses, trace edema, no cyanosis, no clubbing Skin:  No rashes no nodules Neuro:  CN II through XII intact, motor grossly intact   DEVICE  Normal device function. See PaceArt for details.  Biv pacing only 24%  Assess/Plan: 1. Atrial fib - he continues with atrial fib and is sympomatic. His rate is not well controlled. We have no good way to keep him in NSR. I have recommended proceeding with AV node ablation.  2. Chronic systolic and diastolic heart failure - his symptoms are worse in atrial fibrillation, approaching class III. Hopefully he can be controlled from a rate control perspective and improve his heart failure after having an AV node ablation. He will continue his current medications and maintain a low-sodium diet. 3. Pacemaker - his St. Jude biventricular pacemaker is working normally. We'll plan to recheck in several months. 4.  Coronary artery disease - he denies anginal symptoms. He will continue his current medical therapy.  Cristopher Peru, M.D.

## 2016-04-01 ENCOUNTER — Encounter: Payer: Self-pay | Admitting: Cardiology

## 2016-04-01 LAB — CUP PACEART INCLINIC DEVICE CHECK
Battery Voltage: 2.96 V
Brady Statistic RA Percent Paced: 4.6 %
Brady Statistic RV Percent Paced: 24 %
Date Time Interrogation Session: 20180123180724
Implantable Lead Implant Date: 20140422
Implantable Lead Implant Date: 20140424
Implantable Lead Location: 753858
Implantable Lead Location: 753859
Implantable Lead Model: 5076
Lead Channel Impedance Value: 437.5 Ohm
Lead Channel Pacing Threshold Amplitude: 1 V
Lead Channel Pacing Threshold Pulse Width: 0.4 ms
Lead Channel Sensing Intrinsic Amplitude: 0.2 mV
Lead Channel Setting Pacing Pulse Width: 0.4 ms
MDC IDC LEAD IMPLANT DT: 20140424
MDC IDC LEAD LOCATION: 753860
MDC IDC MSMT LEADCHNL LV IMPEDANCE VALUE: 675 Ohm
MDC IDC MSMT LEADCHNL LV PACING THRESHOLD PULSEWIDTH: 0.5 ms
MDC IDC MSMT LEADCHNL RA IMPEDANCE VALUE: 450 Ohm
MDC IDC MSMT LEADCHNL RV PACING THRESHOLD AMPLITUDE: 1 V
MDC IDC MSMT LEADCHNL RV SENSING INTR AMPL: 7.6 mV
MDC IDC PG IMPLANT DT: 20140422
MDC IDC SET LEADCHNL LV PACING AMPLITUDE: 2 V
MDC IDC SET LEADCHNL LV PACING PULSEWIDTH: 0.5 ms
MDC IDC SET LEADCHNL RA PACING AMPLITUDE: 2 V
MDC IDC SET LEADCHNL RV PACING AMPLITUDE: 2.5 V
MDC IDC SET LEADCHNL RV SENSING SENSITIVITY: 2.5 mV
Pulse Gen Model: 3210
Pulse Gen Serial Number: 2926516

## 2016-04-07 ENCOUNTER — Encounter (HOSPITAL_COMMUNITY): Payer: Medicare Other

## 2016-04-13 ENCOUNTER — Encounter (HOSPITAL_COMMUNITY): Payer: Self-pay

## 2016-04-13 ENCOUNTER — Ambulatory Visit (INDEPENDENT_AMBULATORY_CARE_PROVIDER_SITE_OTHER): Payer: Medicare Other

## 2016-04-13 ENCOUNTER — Ambulatory Visit (HOSPITAL_COMMUNITY)
Admission: RE | Admit: 2016-04-13 | Discharge: 2016-04-13 | Disposition: A | Discharge status: Home / Self Care | Payer: Medicare Other | Source: Ambulatory Visit | Attending: Cardiology | Admitting: Cardiology

## 2016-04-13 DIAGNOSIS — E785 Hyperlipidemia, unspecified: Secondary | ICD-10-CM | POA: Insufficient documentation

## 2016-04-13 DIAGNOSIS — Z79899 Other long term (current) drug therapy: Secondary | ICD-10-CM | POA: Insufficient documentation

## 2016-04-13 DIAGNOSIS — Z5181 Encounter for therapeutic drug level monitoring: Secondary | ICD-10-CM

## 2016-04-13 DIAGNOSIS — I5022 Chronic systolic (congestive) heart failure: Secondary | ICD-10-CM | POA: Diagnosis not present

## 2016-04-13 DIAGNOSIS — I481 Persistent atrial fibrillation: Secondary | ICD-10-CM | POA: Diagnosis not present

## 2016-04-13 DIAGNOSIS — Z7901 Long term (current) use of anticoagulants: Secondary | ICD-10-CM | POA: Insufficient documentation

## 2016-04-13 DIAGNOSIS — I2510 Atherosclerotic heart disease of native coronary artery without angina pectoris: Secondary | ICD-10-CM | POA: Diagnosis not present

## 2016-04-13 DIAGNOSIS — Z87891 Personal history of nicotine dependence: Secondary | ICD-10-CM | POA: Insufficient documentation

## 2016-04-13 DIAGNOSIS — I428 Other cardiomyopathies: Secondary | ICD-10-CM | POA: Diagnosis not present

## 2016-04-13 DIAGNOSIS — I442 Atrioventricular block, complete: Secondary | ICD-10-CM | POA: Insufficient documentation

## 2016-04-13 DIAGNOSIS — I4891 Unspecified atrial fibrillation: Secondary | ICD-10-CM | POA: Diagnosis not present

## 2016-04-13 DIAGNOSIS — I480 Paroxysmal atrial fibrillation: Secondary | ICD-10-CM

## 2016-04-13 LAB — BASIC METABOLIC PANEL
Anion gap: 10 (ref 5–15)
BUN: 23 mg/dL — AB (ref 6–20)
CHLORIDE: 102 mmol/L (ref 101–111)
CO2: 26 mmol/L (ref 22–32)
Calcium: 9.3 mg/dL (ref 8.9–10.3)
Creatinine, Ser: 1.22 mg/dL (ref 0.61–1.24)
GFR calc Af Amer: >60 mL/min (ref >60)
GFR, EST NON AFRICAN AMERICAN: 53 mL/min — AB (ref >60)
GLUCOSE: 107 mg/dL — AB (ref 65–99)
POTASSIUM: 4.3 mmol/L (ref 3.5–5.1)
Sodium: 138 mmol/L (ref 135–145)

## 2016-04-13 LAB — BRAIN NATRIURETIC PEPTIDE: B Natriuretic Peptide: 823 pg/mL — ABNORMAL HIGH (ref 0.0–100.0)

## 2016-04-13 LAB — POCT INR: INR: 2.5

## 2016-04-13 MED ORDER — LOSARTAN POTASSIUM 25 MG PO TABS: 25.0000 mg | ORAL_TABLET | Freq: Every day | ORAL | 3 refills | Status: DC

## 2016-04-13 MED ORDER — SPIRONOLACTONE 25 MG PO TABS
25.0000 mg | ORAL_TABLET | Freq: Every evening | ORAL | 3 refills | Status: DC
Start: 2016-04-13 — End: 2016-05-19

## 2016-04-13 MED ORDER — FUROSEMIDE 80 MG PO TABS: 80.0000 mg | ORAL_TABLET | Freq: Two times a day (BID) | ORAL | 2 refills | Status: DC

## 2016-04-13 NOTE — Progress Notes (Signed)
Patient ID: [name], male   DOB: [birth year], 83 y.o.   MRN: [id number] PCP: Dr. Yong [name] Cardiology: Dr. Aundra [name]  83 yo with history of paroxysmal atrial fibrillation, nonischemic cardiomyopathy, and complete heart block with St Jude CRT-P system presents for cardiology followup.  He was admitted in 4/14 with pacemaker pocket infection.  His pacemaker was removed and temporary-permanent device was placed.  Later, he had the temporary-permanent device removed and a new CRT-P device was placed. He developed dyspnea post-operatively and was found to have a large right-sided pleural effusion.  He was admitted and got a chest tube for drainage. He had followup with Dr. Servando [name] and it was decided that he would not need VATS.  He had been on amiodarone for maintenance of NSR.  This had been more successful than Tikosyn.   Around 3/15, he started developing increasing exertional dyspnea.  He was short of breath walking up a hill or incline.  He could still walk on the treadmill for exercise for 10-15 minutes and use the elliptical without much trouble on most days.  No orthopnea or PND.  No chest pain.  He saw Dr [name] and was started on Lasix three times a week due to concern for volume overload.  This did not seem to have helped much.  After that, he had PFTs done which showed normal spirometry but DLCO 50% predicted.  Therefore, amiodarone was stopped due to concern for lung toxicity.  He saw [name] for a pre-operative evaluation prior to right inguinal hernia repair.  Given the exertional dyspnea, he was set up for adenosine Cardiolite in 5/15.  This showed EF 36% with a small area of primarily scar in the apex, apical lateral wall, and mid anterolateral wall.  He has not felt palpitations. Echo in 5/15 showed EF 40-45%, similar to prior study.   I had him see pulmonary for evaluation for amiodarone lung toxicity.  CT chest looked ok, and he was not thought to have amiodarone lung toxicity.   At a  prior appointment in 2016, [name] was noted to be back in atrial fibrillation.  He was more short of breath with exertion and fatigued.  He has historically tolerated atrial fibrillation poorly.  I did a TEE-guided DCCV in 5/16 back to NSR.  TEE showed EF 35-40%.  He did not immediately feel better like he has in the past post-DCCV, so Lasix was increased.    Recurrent symptomatic atrial fibrillation in 8/17 with TEE-guided DCCV to NSR.  TEE showed EF 25-30%.  Recurrent symptomatic atrial fibrillation with CHF exacerbation in 9/17.  He had repeat DCCV and Lasix was increased.   He was back in atrial fibrillation in 11/17 and appears to have been out of rhythm since then.  He has been more short of breath in atrial fibrillation.  We have increased his lasix, now on 80 qam/40 qpm.  He is short of breath walking up hills/inclines.  Generally ok on flat ground but not able to go as long on an elliptical (has to stop at 10 minutes).  He has bendopnea but no orthopnea.  Rare episodes that seem to be PND.  Weight is down 2 lbs since increasing Lasix at last appointment.   He saw Dr. Lovena [name] recently and was BiV pacing only 24% of the time.  AV nodal ablation with BiV pacing was recommended.   Labs (7/13); LDL 65, HDL 38 Labs (3/14): TSH normal Labs (4/14):  ALT 61, AST 62 Labs (5/14):  K 4.5, creatinine 0.9, BNP 727, digoxin 0.9 Labs (6/14): LDL 77, HDL 34, K 4, creatinine 0.9, TSH normal Labs (10/14): digoxin 2.1, K 4.7, creatinine 1.0, TSH normal, LFTs normal Labs (3/15): K 4, creatinine 1.1 Labs (4/15): BNP 482 Labs (5/15): K 3.8, creatinine 1.2 Labs (6/15): K 4.3, creatinine 0.9 Labs (7/15): TSH normal, LFTs normal Labs (9/15): TSH normal, LFTs normal Labs (1/16): K 4.3, creatinine 1.07 Labs (5/16): TSH normal, LFTs normal, K 4.4, creatinine 1.02 Labs (6/16): K 4.4, creatinine 1.29 Labs (9/16): LDL 92, LFTs normal Labs (10/16): K 4.5, creatinine 1.07 Labs (3/17): TSH normal Labs (9/17): K  3.4, creatinine 1.32 => 1.31, BNP 837 => 639 Labs (1/18): K 4.3, creatinine 1.26  PMH: 1. Low back pain 2. Hyperlipidemia 3. GERD 4. BPH 5. Polymyalgia rheumatica 6. Atrial fibrillation: Failed Tikosyn in the past.  Paroxysmal, on amiodarone and warfarin.  DCCV in 4/13, 7/13, 10/13.  Has held NSR with amiodarone.  However, concern for amiodarone lung toxicity: PFTs (4/15) with FVC 90% predicted, FEV1 90%, ratio 97%, DLCO 50%.   Recurrence of atrial fibrillation in 5/16, TEE-guided DCCV back to NSR in 5/16.  - Recurrence of atrial fibrillation in 8/17. TEE-guided DCCV 11/07/15.  - Recurrence of atrial fibrillation 9/17.  DCCV 11/20/15 but back in atrial fibrillation by 11/26/15.  - Amiodarone stopped due to persistent atrial fibrillation.  7. Transaminitis: Mild, attributed to amiodarone.  8. Complete heart block s/p St Jude CRT-P device.  Patient developed PCM pocket infection in 4/14 and had his first system removed with placement of a temporary permanent PCM.  He later had CRT-P device replaced.  This was complicated by large right-sided hemorrhagic pleural effusion requiring chest tube.   9. Nonischemic cardiomyopathy: Echo (9/13) with EF 40-45%, diffuse HK, mild Craig.  Mild CAD only on prior LHC.  Echo (4/14) with EF 45-50%.  Adenosine Cardiolite (5/15) with EF 36% (visually appeared higher per report), small area of scar with peri-infarct ischemia in the apex, apical lateral wall, and mid anterolateral wall. Echo (5/15) with EF 40-45%, basal to mid inferolateral severe hypokinesis, basal to mid anterolateral hypokinesis, mildly dilated RV with mildly decreased RV systolic function, mild to moderate Craig.  TEE (5/16) with EF 35-40%, inferior and inferolateral severe hypokinesis, normal RV size with mildly decreased systolic function, moderate Craig, peak RV-RA gradient 26 mmHg.  - TEE (8/17): EF 25-30%, mildly dilated RV with mildly decreased systolic function, moderate biatrial enlargement, moderate  central Craig.  10. Large right pleural effusion requiring chest tube following removal and reimplantation of PCM. Most recent chest CT in 6/15 showed significant decrease in size of loculated pleural effusion since 5/14.    SH: Married, prior smoker (quit 1985), no ETOH x years, retired, lives at [facility]  [place]: Father with MI at 37, mother with AAA.   ROS: All systems reviewed and negative except as per HPI.   Current Outpatient Prescriptions  Medication Sig Dispense Refill  . furosemide (LASIX) 80 MG tablet Take 1 tablet (80 mg total) by mouth 2 (two) times daily. 180 tablet 2  . losartan (COZAAR) 25 MG tablet Take 1 tablet (25 mg total) by mouth daily. 90 tablet 3  . metoprolol succinate (TOPROL-XL) 25 MG 24 hr tablet Take 25 mg by mouth daily.    . potassium chloride SA (K-DUR,KLOR-CON) 20 MEQ tablet Take 1 tablet (20 mEq total) by mouth 2 (two) times daily. 180 tablet 2  . pravastatin (PRAVACHOL) 80 MG tablet Take 0.5 tablets (40  mg total) by mouth daily. 45 tablet 3  . spironolactone (ALDACTONE) 25 MG tablet Take 1 tablet (25 mg total) by mouth every evening. 90 tablet 3  . tamsulosin (FLOMAX) 0.4 MG CAPS capsule Take 1 capsule (0.4 mg total) by mouth daily. 90 capsule 3  . warfarin (COUMADIN) 5 MG tablet TAKE 1/2 TO 1 TABLET (2.5 TO 5 MG TOTAL) DAILY AS DIRECTED BY ANTICOAGULATION CLINIC 90 tablet 3   No current facility-administered medications for this encounter.     BP 127/88   Pulse 85   Wt 153 lb 8 oz (69.6 kg)   SpO2 98%   BMI 24.78 kg/m  General: NAD Neck: JVP 14 cm, no thyromegaly or thyroid nodule.  Lungs: CTAB  CV: Nondisplaced PMI.  Heart irregular S1/S2, no S3/S4, no murmur.  No edema.  No carotid bruit.  Normal pedal pulses.  Abdomen: Soft, nontender, no hepatosplenomegaly, no distention.  Neurologic: Alert and oriented x 3.  Psych: Normal affect. Extremities: No clubbing or cyanosis.   Assessment/Plan: 1. Chronic systolic CHF:  Presumed nonischemic  cardiomyopathy (prior cath with only mild CAD).  He had an adenosine Cardiolite in 5/15 with EF 36% and possible small area of apical and lateral scar with peri-infarct ischemia. He has St Jude CRT-P.  TEE in 8/17 showed EF 25-30% . He seems to have tolerated persistent atrial fibrillation poorly.  NYHA class II-III symptoms with ongoing volume overload on exam.  He is not effectively BiV pacing due to atrial fibrillation.  - Increase Lasix to 80 mg bid.  BMET/BNP today and again in 10 days.  - Increase losartan to 25 mg daily and spironolactone to 25 mg daily.  .  - Continue Toprol XL 25 daily.  - Plan for AV nodal ablation.  Hopefully this will help him symptomatically with the resumption of BiV pacing.  2. Atrial fibrillation: Now persistent.  Had breakthrough with Tikosyn and now not able to hold NSR on amiodarone. He is now off amiodarone.   - Scheduled for AV nodal ablation later this month.  - Continue warfarin.  3. Hyperlipidemia: History of nonobstructive CAD.  Continue pravastatin.    Followup in 1 month.    [name] 04/13/2016

## 2016-04-13 NOTE — Patient Instructions (Signed)
Increase Furosemide to 80 mg Twice daily   Increase Losartan to 25 mg daily  Increase Spironolactone to 25 mg daily  Labs today  Labs in 10 days  Your physician recommends that you schedule a follow-up appointment in: 1 month

## 2016-04-15 ENCOUNTER — Other Ambulatory Visit: Payer: Medicare Other | Admitting: *Deleted

## 2016-04-15 DIAGNOSIS — I2510 Atherosclerotic heart disease of native coronary artery without angina pectoris: Secondary | ICD-10-CM | POA: Diagnosis not present

## 2016-04-15 DIAGNOSIS — E7800 Pure hypercholesterolemia, unspecified: Secondary | ICD-10-CM | POA: Diagnosis not present

## 2016-04-15 DIAGNOSIS — I480 Paroxysmal atrial fibrillation: Secondary | ICD-10-CM

## 2016-04-15 NOTE — Addendum Note (Signed)
Addended by: Eulis Foster on: 04/15/2016 10:15 AM   Modules accepted: Orders

## 2016-04-16 LAB — CBC WITH DIFFERENTIAL/PLATELET
BASOS ABS: 0 10*3/uL (ref 0.0–0.2)
Basos: 0 %
EOS (ABSOLUTE): 0.1 10*3/uL (ref 0.0–0.4)
Eos: 2 %
HEMOGLOBIN: 13.6 g/dL (ref 13.0–17.7)
Hematocrit: 39.9 % (ref 37.5–51.0)
Immature Grans (Abs): 0 10*3/uL (ref 0.0–0.1)
Immature Granulocytes: 0 %
Lymphocytes Absolute: 1.6 10*3/uL (ref 0.7–3.1)
Lymphs: 30 %
MCH: 30.9 pg (ref 26.6–33.0)
MCHC: 34.1 g/dL (ref 31.5–35.7)
MCV: 91 fL (ref 79–97)
MONOCYTES: 11 %
MONOS ABS: 0.6 10*3/uL (ref 0.1–0.9)
NEUTROS ABS: 3 10*3/uL (ref 1.4–7.0)
Neutrophils: 57 %
Platelets: 147 10*3/uL — ABNORMAL LOW (ref 150–379)
RBC: 4.4 x10E6/uL (ref 4.14–5.80)
RDW: 15.8 % — ABNORMAL HIGH (ref 12.3–15.4)
WBC: 5.3 10*3/uL (ref 3.4–10.8)

## 2016-04-16 LAB — BASIC METABOLIC PANEL
BUN / CREAT RATIO: 21 (ref 10–24)
BUN: 29 mg/dL — AB (ref 8–27)
CHLORIDE: 97 mmol/L (ref 96–106)
CO2: 24 mmol/L (ref 18–29)
Calcium: 9.1 mg/dL (ref 8.6–10.2)
Creatinine, Ser: 1.37 mg/dL — ABNORMAL HIGH (ref 0.76–1.27)
GFR calc non Af Amer: 47 mL/min/{1.73_m2} — ABNORMAL LOW (ref >59)
GFR, EST AFRICAN AMERICAN: 54 mL/min/{1.73_m2} — AB (ref >59)
Glucose: 78 mg/dL (ref 65–99)
Potassium: 4.5 mmol/L (ref 3.5–5.2)
SODIUM: 136 mmol/L (ref 134–144)

## 2016-04-16 LAB — PROTIME-INR
INR: 2.4 — AB (ref 0.8–1.2)
Prothrombin Time: 24.2 s — ABNORMAL HIGH (ref 9.1–12.0)

## 2016-04-20 ENCOUNTER — Ambulatory Visit (HOSPITAL_COMMUNITY)
Admission: RE | Admit: 2016-04-20 | Discharge: 2016-04-20 | Disposition: A | Discharge status: Home / Self Care | Payer: Medicare Other | Source: Ambulatory Visit | Attending: Internal Medicine | Admitting: Internal Medicine

## 2016-04-20 ENCOUNTER — Encounter (HOSPITAL_COMMUNITY)
Admission: RE | Disposition: A | Discharge status: Home / Self Care | Payer: Self-pay | Source: Ambulatory Visit | Attending: Internal Medicine

## 2016-04-20 DIAGNOSIS — Z87891 Personal history of nicotine dependence: Secondary | ICD-10-CM | POA: Insufficient documentation

## 2016-04-20 DIAGNOSIS — M353 Polymyalgia rheumatica: Secondary | ICD-10-CM | POA: Insufficient documentation

## 2016-04-20 DIAGNOSIS — I442 Atrioventricular block, complete: Secondary | ICD-10-CM | POA: Insufficient documentation

## 2016-04-20 DIAGNOSIS — E785 Hyperlipidemia, unspecified: Secondary | ICD-10-CM | POA: Insufficient documentation

## 2016-04-20 DIAGNOSIS — I5042 Chronic combined systolic (congestive) and diastolic (congestive) heart failure: Secondary | ICD-10-CM | POA: Diagnosis not present

## 2016-04-20 DIAGNOSIS — K219 Gastro-esophageal reflux disease without esophagitis: Secondary | ICD-10-CM | POA: Insufficient documentation

## 2016-04-20 DIAGNOSIS — I2510 Atherosclerotic heart disease of native coronary artery without angina pectoris: Secondary | ICD-10-CM | POA: Insufficient documentation

## 2016-04-20 DIAGNOSIS — I429 Cardiomyopathy, unspecified: Secondary | ICD-10-CM | POA: Insufficient documentation

## 2016-04-20 DIAGNOSIS — Z7901 Long term (current) use of anticoagulants: Secondary | ICD-10-CM | POA: Insufficient documentation

## 2016-04-20 DIAGNOSIS — Z8249 Family history of ischemic heart disease and other diseases of the circulatory system: Secondary | ICD-10-CM | POA: Insufficient documentation

## 2016-04-20 DIAGNOSIS — Z45010 Encounter for checking and testing of cardiac pacemaker pulse generator [battery]: Secondary | ICD-10-CM | POA: Insufficient documentation

## 2016-04-20 DIAGNOSIS — I495 Sick sinus syndrome: Secondary | ICD-10-CM | POA: Insufficient documentation

## 2016-04-20 DIAGNOSIS — I428 Other cardiomyopathies: Secondary | ICD-10-CM

## 2016-04-20 DIAGNOSIS — I4891 Unspecified atrial fibrillation: Secondary | ICD-10-CM | POA: Diagnosis present

## 2016-04-20 HISTORY — PX: AV NODE ABLATION: EP1193

## 2016-04-20 LAB — PROTIME-INR
INR: 1.99
Prothrombin Time: 22.9 seconds — ABNORMAL HIGH (ref 11.4–15.2)

## 2016-04-20 SURGERY — AV NODE ABLATION
Anesthesia: LOCAL

## 2016-04-20 MED ORDER — HEPARIN (PORCINE) IN NACL 2-0.9 UNIT/ML-% IJ SOLN
INTRAMUSCULAR | Status: AC
  Filled 2016-04-20: qty 500

## 2016-04-20 MED ORDER — SODIUM CHLORIDE 0.9% FLUSH: 3.0000 mL | INTRAVENOUS | Status: DC | PRN

## 2016-04-20 MED ORDER — SODIUM CHLORIDE 0.9 % IV SOLN: 250.0000 mL | INTRAVENOUS | Status: DC | PRN

## 2016-04-20 MED ORDER — ONDANSETRON HCL 4 MG/2ML IJ SOLN: 4.0000 mg | Freq: Four times a day (QID) | INTRAMUSCULAR | Status: DC | PRN

## 2016-04-20 MED ORDER — FENTANYL CITRATE (PF) 100 MCG/2ML IJ SOLN
INTRAMUSCULAR | Status: AC
  Filled 2016-04-20: qty 2

## 2016-04-20 MED ORDER — HEPARIN (PORCINE) IN NACL 2-0.9 UNIT/ML-% IJ SOLN
INTRAMUSCULAR | Status: DC | PRN
  Administered 2016-04-20: 08:00:00

## 2016-04-20 MED ORDER — SODIUM CHLORIDE 0.9% FLUSH: 3.0000 mL | Freq: Two times a day (BID) | INTRAVENOUS | Status: DC

## 2016-04-20 MED ORDER — SODIUM CHLORIDE 0.9 % IV SOLN
INTRAVENOUS | Status: DC
  Administered 2016-04-20: 06:00:00 via INTRAVENOUS

## 2016-04-20 MED ORDER — BUPIVACAINE HCL (PF) 0.25 % IJ SOLN
INTRAMUSCULAR | Status: DC | PRN
  Administered 2016-04-20: 45 mL

## 2016-04-20 MED ORDER — MIDAZOLAM HCL 5 MG/5ML IJ SOLN
INTRAMUSCULAR | Status: AC
  Filled 2016-04-20: qty 5

## 2016-04-20 MED ORDER — MIDAZOLAM HCL 5 MG/5ML IJ SOLN
INTRAMUSCULAR | Status: DC | PRN
  Administered 2016-04-20: 2 mg via INTRAVENOUS

## 2016-04-20 MED ORDER — BUPIVACAINE HCL (PF) 0.25 % IJ SOLN
INTRAMUSCULAR | Status: AC
  Filled 2016-04-20: qty 60

## 2016-04-20 MED ORDER — ACETAMINOPHEN 325 MG PO TABS
650.0000 mg | ORAL_TABLET | ORAL | Status: DC | PRN
  Filled 2016-04-20: qty 2

## 2016-04-20 MED ORDER — FENTANYL CITRATE (PF) 100 MCG/2ML IJ SOLN
INTRAMUSCULAR | Status: DC | PRN
  Administered 2016-04-20: 12.5 ug via INTRAVENOUS

## 2016-04-20 SURGICAL SUPPLY — 7 items
BAG SNAP BAND KOVER 36X36 (MISCELLANEOUS) ×1 IMPLANT
CATH CELSIUS THERMO F CV 7FR (ABLATOR) ×1 IMPLANT
PACK EP LATEX FREE (CUSTOM PROCEDURE TRAY) ×2
PACK EP LF (CUSTOM PROCEDURE TRAY) IMPLANT
PAD DEFIB LIFELINK (PAD) ×1 IMPLANT
SHEATH PINNACLE 8F 10CM (SHEATH) ×1 IMPLANT
SHIELD RADPAD SCOOP 12X17 (MISCELLANEOUS) ×1 IMPLANT

## 2016-04-20 NOTE — Interval H&P Note (Signed)
History and Physical Interval Note:  04/20/2016 7:31 AM  [name]  has presented today for surgery, with the diagnosis of Uncontrolled afib, chronic systolic heart failure  The various methods of treatment have been discussed with the patient and family. After consideration of risks, benefits and other options for treatment, the patient has consented to  Procedure(s): AV Node Ablation (N/A) as a surgical intervention .  The patient's history has been reviewed, patient examined, no change in status, stable for surgery.  I have reviewed the patient's chart and labs.  Questions were answered to the patient's satisfaction.     Cristopher Peru

## 2016-04-20 NOTE — Discharge Instructions (Signed)
Endovenous Ablation, Care After Introduction Refer to this sheet in the next few weeks. These instructions provide you with information about caring for yourself after your procedure. Your health care provider may also give you more specific instructions. Your treatment has been planned according to current medical practices, but problems sometimes occur. Call your health care provider if you have any problems or questions after your procedure. What can I expect after the procedure? After the procedure, it is common to have:  Bruising.  Tenderness. Follow these instructions at home:   Incision care  Follow instructions from your health care provider about how to take care of your incision. Make sure you:  Wash your hands with soap and water before you change your bandage (dressing). If soap and water are not available, use hand sanitizer.  Change your dressing as told by your health care provider.  Follow instructions from your health care provider about when you should remove your dressing.  Check your incision area every day for signs of infection. Watch for:  Redness, swelling, or pain.  Fluid, blood, or pus.  Keep the bandage (dressing) dry until your health care provider says it can be removed. Activity  Walk regularly as told by your health care provider. This helps with healing and helps to prevent blood clots from forming.  Avoid strenuous exercise as told by your health care provider. General instructions  Take over-the-counter and prescription medicines only as told by your health care provider.  Do not take long car trips or travel by air until your health care provider has approved.  Wear compression stockings as told by your health care provider. These stockings help to prevent blood clots and reduce swelling in your legs.  Keep all follow-up visits as told by your health care provider. This is important. Contact a health care provider if:  You have a  fever.  You have redness, swelling, or pain at the site of your incision.  You have fluid, blood, or pus coming from your incision. Get help right away if:  You notice red streaks coming from the incision.  You develop nausea or vomiting.  You have trouble breathing.  You develop chest pain. This information is not intended to replace advice given to you by your health care provider. Make sure you discuss any questions you have with your health care provider. Document Released: 02/12/2011 Document Revised: 08/01/2015 Document Reviewed: 05/29/2014  2017 Elsevier

## 2016-04-20 NOTE — Progress Notes (Addendum)
Site area: Right groin a 8 french venous sheath was removed   Site Prior to Removal:  Level 0  Pressure Applied For 15 MINUTES     Beginning at 0955am  Manual:   Yes.    Patient Status During Pull:  stable  Post Pull Groin Site:  Level 0  Post Pull Instructions Given:  Yes.    Post Pull Pulses Present:  Yes.    Dressing Applied:  Yes.  Pressure dressing applied   Comments:  VS remain stable during

## 2016-04-20 NOTE — H&P (View-Only) (Signed)
HPI [name] returns today for followup. He is a very pleasant 83 year old man with chronic systolic heart failure, symptomatic bradycardia, high-grade conduction system disease, who developed a pacemaker pocket infection and underwent extraction, initial insertion of a temporary permanent pacemaker, followed by insertion of a biventricular pacemaker. His procedure was complicated by atrial lead perforation x2, resulting in a pleural effusion.  No syncope or palpitations. He underwent DCCV but has reverted back to atrial fib. He had developed amio lung toxicity previously and Tikosyn before that was ineffective. He feels like his heart rate is not well controlled. His main complaint is that he is sob.  Allergies  Allergen Reactions  . Antihistamines, Diphenhydramine-Type Other (See Comments)    Causes difficulty in ability to urinate.     Current Outpatient Prescriptions  Medication Sig Dispense Refill  . furosemide (LASIX) 40 MG tablet Take 40 mg by mouth 2 (two) times daily.    Marland Kitchen losartan (COZAAR) 25 MG tablet 1/2 tablet (12.5mg  ) by mouth  daily 135 tablet 3  . metoprolol succinate (TOPROL-XL) 25 MG 24 hr tablet Take 25 mg by mouth daily.    . potassium chloride SA (K-DUR,KLOR-CON) 20 MEQ tablet Take 1 tablet (20 mEq total) by mouth 2 (two) times daily. 180 tablet 2  . pravastatin (PRAVACHOL) 80 MG tablet Take 0.5 tablets (40 mg total) by mouth daily. 45 tablet 3  . spironolactone (ALDACTONE) 25 MG tablet Take 0.5 tablets (12.5 mg total) by mouth every evening. 45 tablet 3  . tamsulosin (FLOMAX) 0.4 MG CAPS capsule Take 1 capsule (0.4 mg total) by mouth daily. 90 capsule 3  . warfarin (COUMADIN) 5 MG tablet TAKE 1/2 TO 1 TABLET (2.5 TO 5 MG TOTAL) DAILY AS DIRECTED BY ANTICOAGULATION CLINIC 90 tablet 3   No current facility-administered medications for this visit.      Past Medical History:  Diagnosis Date  . Arthritis    "back" (03/20/2014)  . Atrial fibrillation ([place])   . BPH  (benign prostatic hypertrophy)   . Breast mass    "on both sides" (06/28/2012)  . Cardiomyopathy primary-nonischemic EF 45%  . CHF (congestive heart failure) ([place])   . Coronary artery disease   . Diverticulosis of colon without hemorrhage 01/03/2014  . Dysphagia   . Elevated liver enzymes   . Esophageal stricture   . GERD (gastroesophageal reflux disease)   . Hearing loss, mixed, bilateral   . Hx of cardiovascular stress test    Adenosine Myoview (07/2013):  Apical cap, apical lateral and mid anterolateral scar, small amount of peri-infarct ischemia, EF 36%; Medium Risk  . Hyperlipidemia   . Increased prostate specific antigen (PSA) velocity   . Internal hemorrhoids 01/03/2014  . Lumbar back pain   . Pacemaker    st jude  . Pacemaker infection ([place])    06/28/12  . Polymyalgia rheumatica ([place])   . Sick sinus syndrome ([facility])     ROS:   All systems reviewed and negative except as noted in the HPI.   Past Surgical History:  Procedure Laterality Date  . BREAST SURGERY    . CARDIAC CATHETERIZATION  1980's   [place]  . CARDIOVERSION  06/17/2011   Procedure: CARDIOVERSION;  Surgeon: Lelon Perla, MD;  Location: [place];  Service: Cardiovascular;  Laterality: N/A;  . CARDIOVERSION  09/09/2011   Procedure: CARDIOVERSION;  Surgeon: Carlena Bjornstad, MD;  Location: [place];  Service: Cardiovascular;  Laterality: N/A;  . CARDIOVERSION  01/01/2012   Procedure: CARDIOVERSION;  Surgeon: Hillary Bow, MD;  Location: [facility] ENDOSCOPY;  Service: Cardiovascular;  Laterality: N/A;  . CARDIOVERSION N/A 08/03/2014   Procedure: CARDIOVERSION;  Surgeon: Larey Dresser, MD;  Location: [facility] ENDOSCOPY;  Service: Cardiovascular;  Laterality: N/A;  . CARDIOVERSION N/A 11/07/2015   Procedure: CARDIOVERSION;  Surgeon: Larey Dresser, MD;  Location: [facility] ENDOSCOPY;  Service: Cardiovascular;  Laterality: N/A;  . CARDIOVERSION N/A 11/20/2015   Procedure: CARDIOVERSION;  Surgeon: Larey Dresser, MD;  Location: [facility];  Service: Cardiovascular;  Laterality: N/A;  . CATARACT EXTRACTION, BILATERAL Bilateral 1980's  . COLONOSCOPY N/A 01/03/2014   Procedure: COLONOSCOPY;  Surgeon: Gatha Mayer, MD;  Location: [facility] ENDOSCOPY;  Service: Endoscopy;  Laterality: N/A;  . ESOPHAGOGASTRODUODENOSCOPY    . GENERATOR REMOVAL Left 06/15/2012   Procedure: GENERATOR REMOVAL;  Surgeon: Evans Lance, MD;  Location: [place];  Service: Cardiovascular;  Laterality: Left;  . INGUINAL HERNIA REPAIR Right 2012; 03/20/2014  . INGUINAL HERNIA REPAIR Right 03/20/2014   Procedure: OPEN REPAIR OF RIGHT INGUINAL WITH MESH;  Surgeon: Donnie Mesa, MD;  Location: [place];  Service: General;  Laterality: Right;  . INSERT / REPLACE / REMOVE PACEMAKER    . INSERTION OF MESH Right 03/20/2014   Procedure: INSERTION OF MESH;  Surgeon: Donnie Mesa, MD;  Location: [place];  Service: General;  Laterality: Right;  . LEAD REVISION N/A 06/29/2012   Procedure: LEAD REVISION;  Surgeon: Deboraha Sprang, MD;  Location: [facility] CATH LAB;  Service: Cardiovascular;  Laterality: N/A;  . PACEMAKER GENERATOR CHANGE N/A 06/15/2012   Procedure: PACEMAKER GENERATOR CHANGE;  Surgeon: Evans Lance, MD;  Location: [place];  Service: Cardiovascular;  Laterality: N/A;  . PACEMAKER REVISION  06/30/2012   Procedure: PACEMAKER LEAD REVISION;  Surgeon: Evans Lance, MD;  Location: [facility] CATH LAB;  Service: Cardiovascular;;  . PARACENTESIS  ~ [phone]   complication from pacer change in 2014  . PERMANENT PACEMAKER INSERTION N/A 06/28/2012   Procedure: PERMANENT PACEMAKER INSERTION;  Surgeon: Evans Lance, MD;  Location: [facility] CATH LAB;  Service: Cardiovascular;  Laterality: N/A;  . TEE WITHOUT CARDIOVERSION  01/01/2012   Procedure: TRANSESOPHAGEAL ECHOCARDIOGRAM (TEE);  Surgeon: Fay Records, MD;  Location: [facility] ENDOSCOPY;  Service: Cardiovascular;  Laterality: N/A;  . TEE WITHOUT CARDIOVERSION N/A 08/03/2014   Procedure: TRANSESOPHAGEAL ECHOCARDIOGRAM (TEE);  Surgeon: Larey Dresser, MD;  Location: [place];  Service: Cardiovascular;  Laterality: N/A;  . TEE WITHOUT CARDIOVERSION N/A 11/07/2015   Procedure: TRANSESOPHAGEAL ECHOCARDIOGRAM (TEE);  Surgeon: Larey Dresser, MD;  Location: [place];  Service: Cardiovascular;  Laterality: N/A;  . TONSILLECTOMY AND ADENOIDECTOMY  1938  . UMBILICAL HERNIA REPAIR  [phone] X 3     Family History  Problem Relation Age of Onset  . Heart attack Father 78    deceased  . AAA (abdominal aortic aneurysm) Mother     deceased AAA  . Hypertension Mother   . Other Brother     deceased at birth  . Healthy Son   . Healthy Daughter   . Diabetes Cousin      Social History   Social History  . Marital status: Married    Spouse name: N/A  . Number of children: 3  . Years of education: N/A   Occupational History  . retired Retired    Engineering geologist   Social History Main Topics  . Smoking status: Former Smoker    Packs/day: 1.00    Years: 40.00  Types: Cigarettes    Quit date: 03/10/1983  . Smokeless tobacco: Never Used  . 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 Drug use: No  . Sexual activity: Not Currently   Other Topics Concern  . Not on file   Social History Narrative   Married. 3 children (6 together). 5 grandkids with with 5 grandkids (2nd marriage). No greatgrandkids.    One story home.   Retired from Education officer, community: college   Hobbies: play golf, bridge, garden, write family stories     BP (!) 102/58   Pulse 100   Ht 5\' 6"  (1.676 m)   Wt 150 lb 12.8 oz (68.4 kg)   BMI 24.34 kg/m   Physical Exam:  stable appearing elderly man, NAD HEENT: Unremarkable Neck:  7 cm JVD, no thyromegally Back:  No CVA tenderness Lungs:  Clear with no wheezes, rales, or rhonchi. Well-healed pacemaker incision. Well-healed extraction incision. HEART:  IRegular rate rhythm, no murmurs, no rubs, no clicks Abd:   soft, positive bowel sounds, no organomegally, no rebound, no guarding Ext:  2 plus pulses, trace edema, no cyanosis, no clubbing Skin:  No rashes no nodules Neuro:  CN II through XII intact, motor grossly intact   DEVICE  Normal device function. See PaceArt for details.  Biv pacing only 24%  Assess/Plan: 1. Atrial fib - he continues with atrial fib and is sympomatic. His rate is not well controlled. We have no good way to keep him in NSR. I have recommended proceeding with AV node ablation.  2. Chronic systolic and diastolic heart failure - his symptoms are worse in atrial fibrillation, approaching class III. Hopefully he can be controlled from a rate control perspective and improve his heart failure after having an AV node ablation. He will continue his current medications and maintain a low-sodium diet. 3. Pacemaker - his St. Jude biventricular pacemaker is working normally. We'll plan to recheck in several months. 4.  Coronary artery disease - he denies anginal symptoms. He will continue his current medical therapy.  Cristopher Peru, M.D.

## 2016-04-21 ENCOUNTER — Encounter (HOSPITAL_COMMUNITY): Payer: Self-pay | Admitting: Internal Medicine

## 2016-04-23 ENCOUNTER — Ambulatory Visit (HOSPITAL_COMMUNITY)
Admission: RE | Admit: 2016-04-23 | Discharge: 2016-04-23 | Disposition: A | Discharge status: Home / Self Care | Payer: Medicare Other | Source: Ambulatory Visit | Attending: Internal Medicine | Admitting: Internal Medicine

## 2016-04-23 DIAGNOSIS — I5022 Chronic systolic (congestive) heart failure: Secondary | ICD-10-CM | POA: Diagnosis not present

## 2016-04-23 LAB — BASIC METABOLIC PANEL
Anion gap: 8 (ref 5–15)
BUN: 31 mg/dL — ABNORMAL HIGH (ref 6–20)
CO2: 27 mmol/L (ref 22–32)
CREATININE: 1.33 mg/dL — AB (ref 0.61–1.24)
Calcium: 9.4 mg/dL (ref 8.9–10.3)
Chloride: 103 mmol/L (ref 101–111)
GFR calc non Af Amer: 47 mL/min — ABNORMAL LOW (ref >60)
GFR, EST AFRICAN AMERICAN: 55 mL/min — AB (ref >60)
Glucose, Bld: 74 mg/dL (ref 65–99)
Potassium: 4.5 mmol/L (ref 3.5–5.1)
SODIUM: 138 mmol/L (ref 135–145)

## 2016-04-24 ENCOUNTER — Other Ambulatory Visit (HOSPITAL_COMMUNITY): Payer: Self-pay | Admitting: Cardiology

## 2016-04-24 ENCOUNTER — Other Ambulatory Visit (HOSPITAL_COMMUNITY): Payer: Self-pay | Admitting: *Deleted

## 2016-04-24 MED ORDER — POTASSIUM CHLORIDE CRYS ER 20 MEQ PO TBCR: 20.0000 meq | EXTENDED_RELEASE_TABLET | Freq: Two times a day (BID) | ORAL | 2 refills | Status: DC

## 2016-04-24 MED ORDER — FUROSEMIDE 80 MG PO TABS: 80.0000 mg | ORAL_TABLET | Freq: Two times a day (BID) | ORAL | 2 refills | Status: DC

## 2016-04-27 ENCOUNTER — Telehealth: Payer: Self-pay | Admitting: Family Medicine

## 2016-04-27 ENCOUNTER — Ambulatory Visit (INDEPENDENT_AMBULATORY_CARE_PROVIDER_SITE_OTHER): Payer: Medicare Other | Admitting: *Deleted

## 2016-04-27 DIAGNOSIS — I4891 Unspecified atrial fibrillation: Secondary | ICD-10-CM

## 2016-04-27 DIAGNOSIS — Z5181 Encounter for therapeutic drug level monitoring: Secondary | ICD-10-CM

## 2016-04-27 DIAGNOSIS — I480 Paroxysmal atrial fibrillation: Secondary | ICD-10-CM | POA: Diagnosis not present

## 2016-04-27 DIAGNOSIS — I2510 Atherosclerotic heart disease of native coronary artery without angina pectoris: Secondary | ICD-10-CM

## 2016-04-27 LAB — POCT INR: INR: 1.7

## 2016-04-27 NOTE — Telephone Encounter (Signed)
Pt request refill  warfarin (COUMADIN) 5 MG tablet  Pt states [facility] does not have the rx sent to them on 03/24/2016. Pt states he is going to need next week and has no refills. Can you resend to [facility]?

## 2016-04-27 NOTE — Telephone Encounter (Signed)
Pt state that the pharmacy has the medication and it does not need to be resent.

## 2016-04-30 ENCOUNTER — Ambulatory Visit (INDEPENDENT_AMBULATORY_CARE_PROVIDER_SITE_OTHER): Payer: Medicare Other | Admitting: *Deleted

## 2016-04-30 DIAGNOSIS — I495 Sick sinus syndrome: Secondary | ICD-10-CM

## 2016-04-30 LAB — CUP PACEART INCLINIC DEVICE CHECK
Battery Voltage: 2.93 V
Brady Statistic RA Percent Paced: 0 %
Implantable Lead Implant Date: 20140422
Implantable Lead Implant Date: 20140424
Implantable Lead Location: 753860
Implantable Lead Model: 5076
Lead Channel Impedance Value: 450 Ohm
Lead Channel Setting Pacing Amplitude: 2.5 V
Lead Channel Setting Pacing Amplitude: 5 V
Lead Channel Setting Pacing Pulse Width: 0.5 ms
Lead Channel Setting Pacing Pulse Width: 0.5 ms
Lead Channel Setting Sensing Sensitivity: 12.5 mV
MDC IDC LEAD IMPLANT DT: 20140424
MDC IDC LEAD LOCATION: 753858
MDC IDC LEAD LOCATION: 753859
MDC IDC MSMT LEADCHNL LV IMPEDANCE VALUE: 775 Ohm
MDC IDC MSMT LEADCHNL RA SENSING INTR AMPL: 0.7 mV
MDC IDC MSMT LEADCHNL RV SENSING INTR AMPL: 12 mV
MDC IDC PG IMPLANT DT: 20140422
MDC IDC SESS DTM: 20180222172210
MDC IDC STAT BRADY RV PERCENT PACED: 99.5 %
Pulse Gen Serial Number: 2926516

## 2016-04-30 NOTE — Progress Notes (Signed)
Device interrogated for base rate adjustment only. Base rate lowered to 80 and mode changed to VVIR with nominal settings. Patient walked in halls and denies any new ShOB, CP, fatigue or dizziness. ROV with GT 05/2016

## 2016-05-11 ENCOUNTER — Ambulatory Visit (INDEPENDENT_AMBULATORY_CARE_PROVIDER_SITE_OTHER): Payer: Medicare Other | Admitting: *Deleted

## 2016-05-11 ENCOUNTER — Telehealth (HOSPITAL_COMMUNITY): Payer: Self-pay | Admitting: *Deleted

## 2016-05-11 ENCOUNTER — Encounter (HOSPITAL_COMMUNITY): Payer: Self-pay

## 2016-05-11 ENCOUNTER — Ambulatory Visit (HOSPITAL_COMMUNITY)
Admission: RE | Admit: 2016-05-11 | Discharge: 2016-05-11 | Disposition: A | Discharge status: Home / Self Care | Payer: Medicare Other | Source: Ambulatory Visit | Attending: Cardiology | Admitting: Cardiology

## 2016-05-11 ENCOUNTER — Encounter: Payer: Self-pay | Admitting: Cardiology

## 2016-05-11 VITALS — BP 90/54 | HR 82 | Wt 145.2 lb

## 2016-05-11 DIAGNOSIS — I428 Other cardiomyopathies: Secondary | ICD-10-CM | POA: Insufficient documentation

## 2016-05-11 DIAGNOSIS — I481 Persistent atrial fibrillation: Secondary | ICD-10-CM | POA: Insufficient documentation

## 2016-05-11 DIAGNOSIS — E785 Hyperlipidemia, unspecified: Secondary | ICD-10-CM | POA: Insufficient documentation

## 2016-05-11 DIAGNOSIS — Z950 Presence of cardiac pacemaker: Secondary | ICD-10-CM | POA: Insufficient documentation

## 2016-05-11 DIAGNOSIS — Z87891 Personal history of nicotine dependence: Secondary | ICD-10-CM | POA: Insufficient documentation

## 2016-05-11 DIAGNOSIS — I480 Paroxysmal atrial fibrillation: Secondary | ICD-10-CM | POA: Diagnosis not present

## 2016-05-11 DIAGNOSIS — Z5181 Encounter for therapeutic drug level monitoring: Secondary | ICD-10-CM | POA: Diagnosis not present

## 2016-05-11 DIAGNOSIS — I4891 Unspecified atrial fibrillation: Secondary | ICD-10-CM | POA: Diagnosis not present

## 2016-05-11 DIAGNOSIS — I2510 Atherosclerotic heart disease of native coronary artery without angina pectoris: Secondary | ICD-10-CM | POA: Insufficient documentation

## 2016-05-11 DIAGNOSIS — I5022 Chronic systolic (congestive) heart failure: Secondary | ICD-10-CM

## 2016-05-11 DIAGNOSIS — Z7901 Long term (current) use of anticoagulants: Secondary | ICD-10-CM | POA: Insufficient documentation

## 2016-05-11 DIAGNOSIS — I442 Atrioventricular block, complete: Secondary | ICD-10-CM | POA: Diagnosis not present

## 2016-05-11 DIAGNOSIS — Z9889 Other specified postprocedural states: Secondary | ICD-10-CM | POA: Insufficient documentation

## 2016-05-11 DIAGNOSIS — Z79899 Other long term (current) drug therapy: Secondary | ICD-10-CM | POA: Insufficient documentation

## 2016-05-11 LAB — BASIC METABOLIC PANEL
Anion gap: 7 (ref 5–15)
BUN: 49 mg/dL — AB (ref 6–20)
CHLORIDE: 101 mmol/L (ref 101–111)
CO2: 28 mmol/L (ref 22–32)
CREATININE: 1.52 mg/dL — AB (ref 0.61–1.24)
Calcium: 9.6 mg/dL (ref 8.9–10.3)
GFR calc Af Amer: 46 mL/min — ABNORMAL LOW (ref >60)
GFR calc non Af Amer: 40 mL/min — ABNORMAL LOW (ref >60)
GLUCOSE: 120 mg/dL — AB (ref 65–99)
POTASSIUM: 4.6 mmol/L (ref 3.5–5.1)
SODIUM: 136 mmol/L (ref 135–145)

## 2016-05-11 LAB — BRAIN NATRIURETIC PEPTIDE: B Natriuretic Peptide: 329.4 pg/mL — ABNORMAL HIGH (ref 0.0–100.0)

## 2016-05-11 LAB — POCT INR: INR: 1.4

## 2016-05-11 MED ORDER — FUROSEMIDE 80 MG PO TABS: ORAL_TABLET | ORAL | 2 refills | Status: DC

## 2016-05-11 MED ORDER — FUROSEMIDE 80 MG PO TABS: 80.0000 mg | ORAL_TABLET | Freq: Every day | ORAL | 3 refills | Status: DC

## 2016-05-11 MED ORDER — LOSARTAN POTASSIUM 25 MG PO TABS: 12.5000 mg | ORAL_TABLET | Freq: Every day | ORAL | 3 refills | Status: DC

## 2016-05-11 NOTE — Progress Notes (Signed)
Patient ID: [name], male   DOB: 1930-03-03, 83 y.o.   MRN: [id number] PCP: [name] Cardiology: [name]  83 yo with history of paroxysmal atrial fibrillation, nonischemic cardiomyopathy, and complete heart block with St Jude CRT-P system presents for cardiology followup.  He was admitted in 4/14 with pacemaker pocket infection.  His pacemaker was removed and temporary-permanent device was placed.  Later, he had the temporary-permanent device removed and a new CRT-P device was placed. He developed dyspnea post-operatively and was found to have a large right-sided pleural effusion.  He was admitted and got a chest tube for drainage. He had followup with [name] and it was decided that he would not need VATS.  He had been on amiodarone for maintenance of NSR.  This had been more successful than Tikosyn.   Around 3/15, he started developing increasing exertional dyspnea.  He was short of breath walking up a hill or incline.  He could still walk on the treadmill for exercise for 10-15 minutes and use the elliptical without much trouble on most days.  No orthopnea or PND.  No chest pain.  He saw [name] and was started on Lasix three times a week due to concern for volume overload.  This did not seem to have helped much.  After that, he had PFTs done which showed normal spirometry but DLCO 50% predicted.  Therefore, amiodarone was stopped due to concern for lung toxicity.  He saw [name] for a pre-operative evaluation prior to right inguinal hernia repair.  Given the exertional dyspnea, he was set up for adenosine Cardiolite in 5/15.  This showed EF 36% with a small area of primarily scar in the apex, apical lateral wall, and mid anterolateral wall.  He has not felt palpitations. Echo in 5/15 showed EF 40-45%, similar to prior study.   I had him see pulmonary for evaluation for amiodarone lung toxicity.  CT chest looked ok, and he was not thought to have amiodarone lung toxicity.   At a  prior appointment in 2016, [name] was noted to be back in atrial fibrillation.  He was more short of breath with exertion and fatigued.  He has historically tolerated atrial fibrillation poorly.  I did a TEE-guided DCCV in 5/16 back to NSR.  TEE showed EF 35-40%.  He did not immediately feel better like he has in the past post-DCCV, so Lasix was increased.    Recurrent symptomatic atrial fibrillation in 8/17 with TEE-guided DCCV to NSR.  TEE showed EF 25-30%.  Recurrent symptomatic atrial fibrillation with CHF exacerbation in 9/17.  He had repeat DCCV and Lasix was increased.   He was back in atrial fibrillation in 11/17 and appears to have been out of rhythm since then.  He was more short of breath in atrial fibrillation with a significant fall in BiV pacing percentage.  Lasix was significantly increased to try to cope with volume overload.  In 2/18, he had AV nodal ablation to promote BiV pacing.    Since AV nodal ablation, he feels much better.  Weight is down 9 lbs.  He can walk up a hill without much problem.  He is only short of breath after walking 2 or more flights of steps.  No dyspnea on flat ground.  No orthopnea/PND, no bendopnea.  He has noticed increased lightheadedness with standing.  No falls or syncope.   St Jude device was interrogated today => >99% BiV pacing.    Labs (7/13); LDL  65, HDL 38 Labs (3/14): TSH normal Labs (4/14):  ALT 61, AST 62 Labs (5/14): K 4.5, creatinine 0.9, BNP 727, digoxin 0.9 Labs (6/14): LDL 77, HDL 34, K 4, creatinine 0.9, TSH normal Labs (10/14): digoxin 2.1, K 4.7, creatinine 1.0, TSH normal, LFTs normal Labs (3/15): K 4, creatinine 1.1 Labs (4/15): BNP 482 Labs (5/15): K 3.8, creatinine 1.2 Labs (6/15): K 4.3, creatinine 0.9 Labs (7/15): TSH normal, LFTs normal Labs (9/15): TSH normal, LFTs normal Labs (1/16): K 4.3, creatinine 1.07 Labs (5/16): TSH normal, LFTs normal, K 4.4, creatinine 1.02 Labs (6/16): K 4.4, creatinine 1.29 Labs  (9/16): LDL 92, LFTs normal Labs (10/16): K 4.5, creatinine 1.07 Labs (3/17): TSH normal Labs (9/17): K 3.4, creatinine 1.32 => 1.31, BNP 837 => 639 Labs (1/18): K 4.3, creatinine 1.26 Labs (2/18): K 4.5, creatinine 1.33  PMH: 1. Low back pain 2. Hyperlipidemia 3. GERD 4. BPH 5. Polymyalgia rheumatica 6. Atrial fibrillation: Failed Tikosyn in the past.  Paroxysmal, on amiodarone and warfarin.  DCCV in 4/13, 7/13, 10/13.  Has held NSR with amiodarone.  However, concern for amiodarone lung toxicity: PFTs (4/15) with FVC 90% predicted, FEV1 90%, ratio 97%, DLCO 50%.   Recurrence of atrial fibrillation in 5/16, TEE-guided DCCV back to NSR in 5/16.  - Recurrence of atrial fibrillation in 8/17. TEE-guided DCCV 11/07/15.  - Recurrence of atrial fibrillation 9/17.  DCCV 11/20/15 but back in atrial fibrillation by 11/26/15.  - Amiodarone stopped due to persistent atrial fibrillation.  - AV nodal ablation in 2/18 to promote BiV pacing.  7. Transaminitis: Mild, attributed to amiodarone.  8. Complete heart block s/p St Jude CRT-P device.  Patient developed PCM pocket infection in 4/14 and had his first system removed with placement of a temporary permanent PCM.  He later had CRT-P device replaced.  This was complicated by large right-sided hemorrhagic pleural effusion requiring chest tube.   9. Nonischemic cardiomyopathy: Echo (9/13) with EF 40-45%, diffuse HK, mild Craig.  Mild CAD only on prior LHC.  Echo (4/14) with EF 45-50%.  Adenosine Cardiolite (5/15) with EF 36% (visually appeared higher per report), small area of scar with peri-infarct ischemia in the apex, apical lateral wall, and mid anterolateral wall. Echo (5/15) with EF 40-45%, basal to mid inferolateral severe hypokinesis, basal to mid anterolateral hypokinesis, mildly dilated RV with mildly decreased RV systolic function, mild to moderate Craig.  TEE (5/16) with EF 35-40%, inferior and inferolateral severe hypokinesis, normal RV size with mildly  decreased systolic function, moderate Craig, peak RV-RA gradient 26 mmHg.  - TEE (8/17): EF 25-30%, mildly dilated RV with mildly decreased systolic function, moderate biatrial enlargement, moderate central Craig.  - AV nodal ablation 2/18.  10. Large right pleural effusion requiring chest tube following removal and reimplantation of PCM. Most recent chest CT in 6/15 showed significant decrease in size of loculated pleural effusion since 5/14.    SH: Married, prior smoker (quit 1985), no ETOH x years, retired, lives at [facility]  [place]: Father with MI at 23, mother with AAA.   ROS: All systems reviewed and negative except as per HPI.   Current Outpatient Prescriptions  Medication Sig Dispense Refill  . acetaminophen (TYLENOL) 500 MG tablet Take 1,000 mg by mouth every 6 (six) hours as needed for moderate pain or headache.    . fluticasone (FLONASE) 50 MCG/ACT nasal spray Place 2 sprays into both nostrils daily as needed for allergies or rhinitis.    Marland Kitchen losartan (COZAAR) 25 MG tablet  Take 0.5 tablets (12.5 mg total) by mouth daily. 90 tablet 3  . magnesium oxide (MAG-OX) 400 MG tablet Take 400 mg by mouth daily.    . metoprolol succinate (TOPROL-XL) 25 MG 24 hr tablet Take 25 mg by mouth at bedtime.     . potassium chloride SA (K-DUR,KLOR-CON) 20 MEQ tablet Take 1 tablet (20 mEq total) by mouth 2 (two) times daily. 180 tablet 2  . pravastatin (PRAVACHOL) 80 MG tablet Take 0.5 tablets (40 mg total) by mouth daily. 45 tablet 3  . spironolactone (ALDACTONE) 25 MG tablet Take 1 tablet (25 mg total) by mouth every evening. 90 tablet 3  . tamsulosin (FLOMAX) 0.4 MG CAPS capsule Take 1 capsule (0.4 mg total) by mouth daily. (Patient taking differently: Take 0.4 mg by mouth every evening. ) 90 capsule 3  . warfarin (COUMADIN) 5 MG tablet TAKE 1/2 TO 1 TABLET (2.5 TO 5 MG TOTAL) DAILY AS DIRECTED BY ANTICOAGULATION CLINIC (Patient taking differently: Take 2.5mg s daily except on Mondays take 5mg s) 90 tablet 3  .  furosemide (LASIX) 80 MG tablet Take 1 tablet (80 mg total) by mouth daily. 90 tablet 3   No current facility-administered medications for this encounter.     BP (!) 90/54   Pulse 82   Wt 145 lb 4 oz (65.9 kg)   SpO2 98%   BMI 23.44 kg/m  General: NAD Neck: No JVD, no thyromegaly or thyroid nodule.  Lungs: CTAB  CV: Nondisplaced PMI.  Heart regular S1/S2, no S3/S4, no murmur.  No edema.  No carotid bruit.  Normal pedal pulses.  Abdomen: Soft, nontender, no hepatosplenomegaly, no distention.  Neurologic: Alert and oriented x 3.  Psych: Normal affect. Extremities: No clubbing or cyanosis.   Assessment/Plan: 1. Chronic systolic CHF:  Presumed nonischemic cardiomyopathy (prior cath with only mild CAD).  He had an adenosine Cardiolite in 5/15 with EF 36% and possible small area of apical and lateral scar with peri-infarct ischemia. He has St Jude CRT-P.  TEE in 8/17 showed EF 25-30% . He seems to have tolerated persistent atrial fibrillation poorly with a significant fall in BiV pacing percentage.  However, after AV nodal ablation in 2/18, BiV pacing percentage is up to > 99%.  He feels great today.  NYHA class II symptoms with no volume overload.  He does have some orthostatic symptoms.  - Decrease Lasix to 80 qam/40 qpm.  BMET/BNP today.   - Continue current Toprol XL and spironolactone.  - Given orthostatic symptoms, I will decrease losartan to 12.5 mg daily.   2. Atrial fibrillation: Now persistent.  Had breakthrough with Tikosyn and now not able to hold NSR on amiodarone. He is now off amiodarone.  He is status post AV nodal ablation with significant improvement in symptoms and BiV pacing percentage.  - Continue warfarin.  3. Hyperlipidemia: History of nonobstructive CAD.  Continue pravastatin.    [name] 05/11/2016

## 2016-05-11 NOTE — Telephone Encounter (Signed)
Basic metabolic panel  Order: [id number]  Status:  Final result Visible to patient:  Yes (MyChart) Dx:  Chronic systolic CHF (congestive hear...  Notes Recorded by Kennieth Rad, RN on 05/11/2016 at 4:41 PM EST Called and spoke with patient, he is agreeable with plan. Medication updated on list. ------  Notes Recorded by Larey Dresser, MD on 05/11/2016 at 3:08 PM EST He can drop Lasix down to 80 mg once daily.

## 2016-05-11 NOTE — Patient Instructions (Signed)
Decrease Losartan to 12.5 mg (1/2 tab) daily  Decrease Furosemide to 80 mg (1 tab) in AM and 40 mg (1/2 tab) in PM  Labs today  Your physician recommends that you schedule a follow-up appointment in: 3 months

## 2016-05-13 ENCOUNTER — Encounter (HOSPITAL_COMMUNITY): Payer: Medicare Other

## 2016-05-19 ENCOUNTER — Telehealth (HOSPITAL_COMMUNITY): Payer: Self-pay | Admitting: *Deleted

## 2016-05-19 ENCOUNTER — Ambulatory Visit (INDEPENDENT_AMBULATORY_CARE_PROVIDER_SITE_OTHER): Payer: Medicare Other

## 2016-05-19 DIAGNOSIS — Z5181 Encounter for therapeutic drug level monitoring: Secondary | ICD-10-CM | POA: Diagnosis not present

## 2016-05-19 DIAGNOSIS — I4891 Unspecified atrial fibrillation: Secondary | ICD-10-CM | POA: Diagnosis not present

## 2016-05-19 DIAGNOSIS — I2510 Atherosclerotic heart disease of native coronary artery without angina pectoris: Secondary | ICD-10-CM

## 2016-05-19 LAB — POCT INR: INR: 1.6

## 2016-05-19 MED ORDER — SPIRONOLACTONE 25 MG PO TABS: 12.5000 mg | ORAL_TABLET | Freq: Every evening | ORAL | 3 refills | Status: DC

## 2016-05-19 NOTE — Telephone Encounter (Signed)
Decrease spironolactone to 12.5 mg daily.

## 2016-05-19 NOTE — Telephone Encounter (Signed)
Called patient and he is agreeable with plan.  He will call us back if his blood pressure doesn't start to improve. Medication updated.

## 2016-05-19 NOTE — Telephone Encounter (Signed)
Patient called reporting his blood pressure has been running lower than normal.  Past couple of days systolic is 85 and patient feels dizzy.  He stated that he takes his spironolactone, losartan, and toprol-xl in the evening at bedtime.  I told him I would route this message with Dr. Aundra Dubin and will call him as soon as I hear back from him.

## 2016-05-26 ENCOUNTER — Ambulatory Visit (INDEPENDENT_AMBULATORY_CARE_PROVIDER_SITE_OTHER): Payer: Medicare Other | Admitting: Internal Medicine

## 2016-05-26 ENCOUNTER — Encounter: Payer: Self-pay | Admitting: Internal Medicine

## 2016-05-26 VITALS — BP 90/50 | HR 80 | Resp 18 | Ht 66.0 in | Wt 147.0 lb

## 2016-05-26 DIAGNOSIS — K641 Second degree hemorrhoids: Secondary | ICD-10-CM

## 2016-05-26 DIAGNOSIS — I2510 Atherosclerotic heart disease of native coronary artery without angina pectoris: Secondary | ICD-10-CM

## 2016-05-26 DIAGNOSIS — Z7901 Long term (current) use of anticoagulants: Secondary | ICD-10-CM

## 2016-05-26 DIAGNOSIS — R151 Fecal smearing: Secondary | ICD-10-CM | POA: Diagnosis not present

## 2016-05-26 NOTE — Progress Notes (Signed)
   [name] 83 y.o. [birth year] [id number]  Assessment & Plan:   1. Prolapsed internal hemorrhoids, grade 2   2. Fecal soiling   3. Chronic anticoagulation    The patient is improved after banding of hemorrhoids in late December 2017. However he would like to have more banding to see if he can get better symptom control of his fecal soiling due to prolapsing hemorrhoids. I think that that is reasonable but would query his cardiologist about holding his warfarin like we did previously.   Subjective:   Chief Complaint: Fecal soiling  HPI Patient is here after I banded 3 columns of internal hemorrhoids, grade 2 in late December, help fecal soiling. He says his symptoms are better, similar frequency but less volume of seepage or soiling. He is asking as to whether or not we can band again he would like to do so. He takes warfarin for history of atrial fibrillation, he recently had an atrial fibrillation pathway ablation and has been in sinus rhythm. He feels better relating to that.  Medications, allergies, past medical history, past surgical history, family history and social history are reviewed and updated in the EMR.  Review of Systems As above  Objective:   Physical Exam BP (!) 90/50   Pulse 80   Resp 18   Ht 5\' 6"  (1.676 m)   Wt 66.7 kg (147 lb)   BMI 23.73 kg/m  No acute distress

## 2016-05-26 NOTE — Patient Instructions (Signed)
We will contact you after communicating with your cardiologist.

## 2016-05-27 ENCOUNTER — Telehealth: Payer: Self-pay

## 2016-05-27 ENCOUNTER — Telehealth (HOSPITAL_COMMUNITY): Payer: Self-pay | Admitting: *Deleted

## 2016-05-27 DIAGNOSIS — I5022 Chronic systolic (congestive) heart failure: Secondary | ICD-10-CM

## 2016-05-27 DIAGNOSIS — I480 Paroxysmal atrial fibrillation: Secondary | ICD-10-CM

## 2016-05-27 NOTE — Telephone Encounter (Signed)
Patient called in reporting that since we had decreased his spironolactone to 12.5 mg on March 13th, on Friday March 16th he was still feeling dizzy and having low blood pressures as before.  That night he made a decision to decrease his metoprolol to 12.5 mg and Losartan to 12.5 mg and has continued taking those doses since.  He reports that his dizziness is significantly better, blood pressures running in the 30'T systolic, and HR yesterday was 82.   He wants Dr. Aundra Dubin to be aware of this and for him to advise him if he should go back to his prescribed doses or not.  I will route this message to Dr. Aundra Dubin and will call patient back with his response.

## 2016-05-27 NOTE — Telephone Encounter (Signed)
Continue the lower doses.

## 2016-05-27 NOTE — Telephone Encounter (Signed)
-----   Message from Gatha Mayer, MD sent at 05/27/2016 11:20 AM EDT ----- Regarding: FW: hold warfarin? See below re: setting up repeat banding for Mr. Veltre ----- Message ----- From: Larey Dresser, MD Sent: 05/26/2016  11:15 PM To: Gatha Mayer, MD Subject: RE: hold warfarin?                             That would be fine. ----- Message ----- From: Gatha Mayer, MD Sent: 05/26/2016   4:33 PM To: Larey Dresser, MD Subject: hold warfarin?                                 This man has had improvement but not resolution of fecal seepage from hemorrhoids after I banded him.   We held warfarin 2 d before and 5 d after last time - ist that still ok?  Thanks  Glendell Docker

## 2016-05-27 NOTE — Telephone Encounter (Signed)
Spoke with [name] and set up a banding appointment for 06/08/16 at 3:15pm.  Informed him to hold his warfarin 2 days prior to appointment and he verbalized understanding.

## 2016-05-28 MED ORDER — METOPROLOL SUCCINATE ER 25 MG PO TB24: 12.5000 mg | ORAL_TABLET | Freq: Every day | ORAL | 3 refills | Status: DC

## 2016-05-28 NOTE — Telephone Encounter (Signed)
Called patient and told him to continue the lower doses per Dr. Aundra Dubin.  Medications updated in epic.

## 2016-06-03 ENCOUNTER — Encounter: Payer: Self-pay | Admitting: Internal Medicine

## 2016-06-03 ENCOUNTER — Ambulatory Visit (INDEPENDENT_AMBULATORY_CARE_PROVIDER_SITE_OTHER): Payer: Medicare Other | Admitting: Internal Medicine

## 2016-06-03 ENCOUNTER — Ambulatory Visit (INDEPENDENT_AMBULATORY_CARE_PROVIDER_SITE_OTHER): Payer: Medicare Other | Admitting: *Deleted

## 2016-06-03 VITALS — BP 90/60 | HR 81 | Ht 66.0 in | Wt 147.4 lb

## 2016-06-03 DIAGNOSIS — I2510 Atherosclerotic heart disease of native coronary artery without angina pectoris: Secondary | ICD-10-CM

## 2016-06-03 DIAGNOSIS — I4891 Unspecified atrial fibrillation: Secondary | ICD-10-CM

## 2016-06-03 DIAGNOSIS — I495 Sick sinus syndrome: Secondary | ICD-10-CM | POA: Diagnosis not present

## 2016-06-03 DIAGNOSIS — Z950 Presence of cardiac pacemaker: Secondary | ICD-10-CM | POA: Diagnosis not present

## 2016-06-03 DIAGNOSIS — I5022 Chronic systolic (congestive) heart failure: Secondary | ICD-10-CM | POA: Diagnosis not present

## 2016-06-03 DIAGNOSIS — I480 Paroxysmal atrial fibrillation: Secondary | ICD-10-CM

## 2016-06-03 DIAGNOSIS — Z5181 Encounter for therapeutic drug level monitoring: Secondary | ICD-10-CM | POA: Diagnosis not present

## 2016-06-03 LAB — POCT INR: INR: 1.8

## 2016-06-03 NOTE — Progress Notes (Signed)
HPI [name] returns today for followup. He is a very pleasant 83 year old man with chronic systolic heart failure, symptomatic bradycardia, high-grade conduction system disease, who developed a pacemaker pocket infection and underwent extraction, initial insertion of a temporary permanent pacemaker, followed by insertion of a biventricular pacemaker. His procedure was complicated by atrial lead perforation x2, resulting in a pleural effusion.  No syncope or palpitations. He underwent DCCV but has reverted back to atrial fib. He had developed amio lung toxicity previously and Tikosyn before that was ineffective. He feels like his heart rate is not well controlled. His main complaint is that he is sob.  Allergies  Allergen Reactions  . Antihistamines, Diphenhydramine-Type Other (See Comments)    Causes difficulty in ability to urinate.     Current Outpatient Prescriptions  Medication Sig Dispense Refill  . acetaminophen (TYLENOL) 500 MG tablet Take 1,000 mg by mouth every 6 (six) hours as needed for moderate pain or headache.    . fluticasone (FLONASE) 50 MCG/ACT nasal spray Place 2 sprays into both nostrils daily as needed for allergies or rhinitis.    . furosemide (LASIX) 80 MG tablet Take 1 tablet (80 mg total) by mouth daily. 90 tablet 3  . losartan (COZAAR) 25 MG tablet Take 0.5 tablets (12.5 mg total) by mouth daily. 90 tablet 3  . magnesium oxide (MAG-OX) 400 MG tablet Take 400 mg by mouth daily.    . metoprolol succinate (TOPROL-XL) 25 MG 24 hr tablet Take 0.5 tablets (12.5 mg total) by mouth at bedtime. 15 tablet 3  . potassium chloride SA (K-DUR,KLOR-CON) 20 MEQ tablet Take 1 tablet (20 mEq total) by mouth 2 (two) times daily. 180 tablet 2  . pravastatin (PRAVACHOL) 80 MG tablet Take 0.5 tablets (40 mg total) by mouth daily. 45 tablet 3  . spironolactone (ALDACTONE) 25 MG tablet Take 0.5 tablets (12.5 mg total) by mouth every evening. 45 tablet 3  . tamsulosin (FLOMAX) 0.4 MG CAPS  capsule Take 1 capsule (0.4 mg total) by mouth daily. (Patient taking differently: Take 0.4 mg by mouth every evening. ) 90 capsule 3  . warfarin (COUMADIN) 5 MG tablet TAKE 1/2 TO 1 TABLET (2.5 TO 5 MG TOTAL) DAILY AS DIRECTED BY ANTICOAGULATION CLINIC (Patient taking differently: Take 2.5mg s daily except on Mondays take 5mg s) 90 tablet 3   No current facility-administered medications for this visit.      Past Medical History:  Diagnosis Date  . Arthritis    "back" (03/20/2014)  . Atrial fibrillation ([place])   . BPH (benign prostatic hypertrophy)   . Breast mass    "on both sides" (06/28/2012)  . Cardiomyopathy primary-nonischemic EF 45%  . CHF (congestive heart failure) ([place])   . Coronary artery disease   . Diverticulosis of colon without hemorrhage 01/03/2014  . Dysphagia   . Elevated liver enzymes   . Esophageal stricture   . GERD (gastroesophageal reflux disease)   . Hearing loss, mixed, bilateral   . Hx of cardiovascular stress test    Adenosine Myoview (07/2013):  Apical cap, apical lateral and mid anterolateral scar, small amount of peri-infarct ischemia, EF 36%; Medium Risk  . Hyperlipidemia   . Increased prostate specific antigen (PSA) velocity   . Internal hemorrhoids 01/03/2014  . Lumbar back pain   . Pacemaker    st jude  . Pacemaker infection ([place])    06/28/12  . Polymyalgia rheumatica ([place])   . Sick sinus syndrome ([facility])     ROS:   All systems  reviewed and negative except as noted in the HPI.   Past Surgical History:  Procedure Laterality Date  . AV NODE ABLATION N/A 04/20/2016   Procedure: AV Node Ablation;  Surgeon: Evans Lance, MD;  Location: [place];  Service: Cardiovascular;  Laterality: N/A;  . BREAST SURGERY    . CARDIAC CATHETERIZATION  1980's   [place]  . CARDIOVERSION  06/17/2011   Procedure: CARDIOVERSION;  Surgeon: Lelon Perla, MD;  Location: [facility];  Service: Cardiovascular;  Laterality: N/A;  . CARDIOVERSION  09/09/2011    Procedure: CARDIOVERSION;  Surgeon: Carlena Bjornstad, MD;  Location: [facility];  Service: Cardiovascular;  Laterality: N/A;  . CARDIOVERSION  01/01/2012   Procedure: CARDIOVERSION;  Surgeon: Hillary Bow, MD;  Location: [facility] ENDOSCOPY;  Service: Cardiovascular;  Laterality: N/A;  . CARDIOVERSION N/A 08/03/2014   Procedure: CARDIOVERSION;  Surgeon: Larey Dresser, MD;  Location: [facility] ENDOSCOPY;  Service: Cardiovascular;  Laterality: N/A;  . CARDIOVERSION N/A 11/07/2015   Procedure: CARDIOVERSION;  Surgeon: Larey Dresser, MD;  Location: [facility] ENDOSCOPY;  Service: Cardiovascular;  Laterality: N/A;  . CARDIOVERSION N/A 11/20/2015   Procedure: CARDIOVERSION;  Surgeon: Larey Dresser, MD;  Location: [place];  Service: Cardiovascular;  Laterality: N/A;  . CATARACT EXTRACTION, BILATERAL Bilateral 1980's  . COLONOSCOPY N/A 01/03/2014   Procedure: COLONOSCOPY;  Surgeon: Gatha Mayer, MD;  Location: [facility] ENDOSCOPY;  Service: Endoscopy;  Laterality: N/A;  . ESOPHAGOGASTRODUODENOSCOPY    . GENERATOR REMOVAL Left 06/15/2012   Procedure: GENERATOR REMOVAL;  Surgeon: Evans Lance, MD;  Location: [place];  Service: Cardiovascular;  Laterality: Left;  . HEMORRHOID BANDING    . INGUINAL HERNIA REPAIR Right 2012; 03/20/2014  . INGUINAL HERNIA REPAIR Right 03/20/2014   Procedure: OPEN REPAIR OF RIGHT INGUINAL WITH MESH;  Surgeon: Donnie Mesa, MD;  Location: [place];  Service: General;  Laterality: Right;  . INSERT / REPLACE / REMOVE PACEMAKER    . INSERTION OF MESH Right 03/20/2014   Procedure: INSERTION OF MESH;  Surgeon: Donnie Mesa, MD;  Location: [place];  Service: General;  Laterality: Right;  . LEAD REVISION N/A 06/29/2012   Procedure: LEAD REVISION;  Surgeon: Deboraha Sprang, MD;  Location: [facility] CATH LAB;  Service: Cardiovascular;  Laterality: N/A;  . PACEMAKER GENERATOR CHANGE N/A 06/15/2012   Procedure: PACEMAKER GENERATOR CHANGE;  Surgeon: Evans Lance, MD;  Location: [place];  Service: Cardiovascular;  Laterality:  N/A;  . PACEMAKER REVISION  06/30/2012   Procedure: PACEMAKER LEAD REVISION;  Surgeon: Evans Lance, MD;  Location: [facility] CATH LAB;  Service: Cardiovascular;;  . PARACENTESIS  ~ 09/175   complication from pacer change in 2014  . PERMANENT PACEMAKER INSERTION N/A 06/28/2012   Procedure: PERMANENT PACEMAKER INSERTION;  Surgeon: Evans Lance, MD;  Location: [facility] CATH LAB;  Service: Cardiovascular;  Laterality: N/A;  . TEE WITHOUT CARDIOVERSION  01/01/2012   Procedure: TRANSESOPHAGEAL ECHOCARDIOGRAM (TEE);  Surgeon: Fay Records, MD;  Location: [facility] ENDOSCOPY;  Service: Cardiovascular;  Laterality: N/A;  . TEE WITHOUT CARDIOVERSION N/A 08/03/2014   Procedure: TRANSESOPHAGEAL ECHOCARDIOGRAM (TEE);  Surgeon: Larey Dresser, MD;  Location: [place];  Service: Cardiovascular;  Laterality: N/A;  . TEE WITHOUT CARDIOVERSION N/A 11/07/2015   Procedure: TRANSESOPHAGEAL ECHOCARDIOGRAM (TEE);  Surgeon: Larey Dresser, MD;  Location: [place];  Service: Cardiovascular;  Laterality: N/A;  . TONSILLECTOMY AND ADENOIDECTOMY  1938  . UMBILICAL HERNIA REPAIR  [phone] X 3     Family History  Problem Relation Age of Onset  . Heart attack Father 47    deceased  . AAA (abdominal aortic aneurysm) Mother     deceased AAA  . Hypertension Mother   . Other Brother     deceased at birth  . Healthy Son   . Healthy Daughter   . Diabetes Cousin      Social History   Social History  . Marital status: Married    Spouse name: N/A  . Number of children: 3  . Years of education: N/A   Occupational History  . retired Retired    Engineering geologist   Social History Main Topics  . Smoking status: Former Smoker    Packs/day: 1.00    Years: 40.00    Types: Cigarettes    Quit date: 03/10/1983  . Smokeless tobacco: Never Used  . 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 Drug use: No  . Sexual activity: Not Currently   Other Topics Concern  . Not on  file   Social History Narrative   Married. 3 children (6 together). 5 grandkids with with 5 grandkids (2nd marriage). No greatgrandkids.    One story home.   Retired from Education officer, community: college   Hobbies: play golf, bridge, garden, write family stories     BP 90/60   Pulse 81   Ht 5\' 6"  (1.676 m)   Wt 147 lb 6.4 oz (66.9 kg)   SpO2 97%   BMI 23.79 kg/m   Physical Exam:  stable appearing elderly man, NAD HEENT: Unremarkable Neck:  7 cm JVD, no thyromegally Back:  No CVA tenderness Lungs:  Clear with no wheezes, rales, or rhonchi. Well-healed pacemaker incision. Well-healed extraction incision. HEART:  IRegular rate rhythm, no murmurs, no rubs, no clicks Abd:  soft, positive bowel sounds, no organomegally, no rebound, no guarding Ext:  2 plus pulses, trace edema, no cyanosis, no clubbing Skin:  No rashes no nodules Neuro:  CN II through XII intact, motor grossly intact   DEVICE  Normal device function. See PaceArt for details.  Biv pacing only 24%  Assess/Plan: 1. Atrial fib - he continues with atrial fib and is sympomatic. His rate is now well controlled after AV node ablation.  2. Chronic systolic and diastolic heart failure - his symptoms are much improved since his ablation. He will continue his current medications and maintain a low-sodium diet. 3. Pacemaker - his St. Jude biventricular pacemaker is working normally. We'll plan to recheck in several months. 4.  Coronary artery disease - he denies anginal symptoms. He will continue his current medical therapy.  Cristopher Peru, M.D.

## 2016-06-03 NOTE — Patient Instructions (Signed)
Remote monitoring is used to monitor your Pacemaker of ICD from home. This monitoring reduces the number of office visits required to check your device to one time per year. It allows [place] to keep an eye on the functioning of your device to ensure it is working properly. You are scheduled for a device check from home on 09/02/16 . You may send your transmission at any time that day. If you have a wireless device, the transmission will be sent automatically. After your physician reviews your transmission, you will receive a postcard with your next transmission date.  Your physician wants you to follow-up in: 6 months with Dr. Lovena Le.  You will receive a reminder letter in the mail two months in advance. If you don't receive a letter, please call our office to schedule the follow-up appointment.

## 2016-06-08 ENCOUNTER — Encounter: Payer: Self-pay | Admitting: Internal Medicine

## 2016-06-08 ENCOUNTER — Ambulatory Visit (INDEPENDENT_AMBULATORY_CARE_PROVIDER_SITE_OTHER): Payer: Medicare Other | Admitting: Internal Medicine

## 2016-06-08 VITALS — BP 102/54 | Ht 66.0 in | Wt 146.2 lb

## 2016-06-08 DIAGNOSIS — K641 Second degree hemorrhoids: Secondary | ICD-10-CM

## 2016-06-08 DIAGNOSIS — R151 Fecal smearing: Secondary | ICD-10-CM

## 2016-06-08 NOTE — Patient Instructions (Signed)
HEMORRHOID BANDING PROCEDURE    FOLLOW-UP CARE   1. The procedure you have had should have been relatively painless since the banding of the area involved does not have nerve endings and there is no pain sensation.  The rubber band cuts off the blood supply to the hemorrhoid and the band may fall off as soon as 48 hours after the banding (the band may occasionally be seen in the toilet bowl following a bowel movement). You may notice a temporary feeling of fullness in the rectum which should respond adequately to plain Tylenol or Motrin.  2. Following the banding, avoid strenuous exercise that evening and resume full activity the next day.  A sitz bath (soaking in a warm tub) or bidet is soothing, and can be useful for cleansing the area after bowel movements.     3. To avoid constipation, take two tablespoons of natural wheat bran, natural oat bran, flax, Benefiber or any over the counter fiber supplement and increase your water intake to 7-8 glasses daily.    4. Unless you have been prescribed anorectal medication, do not put anything inside your rectum for two weeks: No suppositories, enemas, fingers, etc.  5. Occasionally, you may have more bleeding than usual after the banding procedure.  This is often from the untreated hemorrhoids rather than the treated one.  Don't be concerned if there is a tablespoon or so of blood.  If there is more blood than this, lie flat with your bottom higher than your head and apply an ice pack to the area. If the bleeding does not stop within a half an hour or if you feel faint, call our office at [phone] or go to the emergency room.  6. Problems are not common; however, if there is a substantial amount of bleeding, severe pain, chills, fever or difficulty passing urine (very rare) or other problems, you should call us at [phone] or report to the nearest emergency room.  7. Do not stay seated continuously for more than 2-3 hours for a day or two  after the procedure.  Tighten your buttock muscles 10-15 times every two hours and take 10-15 deep breaths every 1-2 hours.  Do not spend more than a few minutes on the toilet if you cannot empty your bowel; instead re-visit the toilet at a later time.    RE-START YOUR WARFARIN ON April 9th per Dr Carlean Purl.   Call us back in a couple of months if having problems.    I appreciate the opportunity to care for you. Silvano Rusk, MD, [facility]

## 2016-06-10 DIAGNOSIS — K641 Second degree hemorrhoids: Secondary | ICD-10-CM | POA: Insufficient documentation

## 2016-06-10 NOTE — Assessment & Plan Note (Signed)
Banded all 3 today Off warfarin additional week f/u 2 mos/prn

## 2016-06-10 NOTE — Progress Notes (Signed)
   For repeat hemorrhoid banding - to treat fecal seepage. Had improvement w/ first bandings a few mos ago. Warfarin is held. Stroke risks and complications off warfarin had been reviewed at last visit and discussed w/ cardiology.  Rectal: deceased sphincter tone - slight - soft brown stool on perianal skin  Anoscopy - Grade 2 internal hemorrhoids all positions  PROCEDURE NOTE: The patient presents with symptomatic grade 2  hemorrhoids, requesting rubber band ligation of his/her hemorrhoidal disease.  All risks, benefits and alternative forms of therapy were described and informed consent was obtained.   The anorectum was pre-medicated with 0.125% NTG and 5% lidocaine The decision was made to band  All 3 columns of  internal hemorrhoids, and the [place] was used to perform band ligation without complication.  Digital anorectal examination was then performed to assure proper positioning of the band, and to adjust the banded tissue as required.  The patient was discharged home without pain or other issues.  Dietary and behavioral recommendations were given and along with follow-up instructions.     The following adjunctive treatments were recommended:  Resume warfarin in 1 week  The patient will return in 2 mos/prn for  follow-up and possible additional banding as required. No complications were encountered and the patient tolerated the procedure well.  I appreciate the opportunity to care for this patient. JY:[id number] Yong Channel, MD

## 2016-06-10 NOTE — Assessment & Plan Note (Signed)
Second round of hemorrhoid banding

## 2016-06-12 LAB — CUP PACEART INCLINIC DEVICE CHECK
Battery Voltage: 2.93 V
Brady Statistic RV Percent Paced: 99.61 %
Date Time Interrogation Session: 20180328135918
Implantable Lead Location: 753859
Implantable Pulse Generator Implant Date: 20140422
Lead Channel Pacing Threshold Amplitude: 0.75 V
Lead Channel Pacing Threshold Amplitude: 1 V
Lead Channel Pacing Threshold Pulse Width: 0.5 ms
Lead Channel Pacing Threshold Pulse Width: 0.5 ms
Lead Channel Setting Pacing Amplitude: 2.5 V
Lead Channel Setting Pacing Amplitude: 2.5 V
Lead Channel Setting Pacing Pulse Width: 0.5 ms
MDC IDC LEAD IMPLANT DT: 20140422
MDC IDC LEAD IMPLANT DT: 20140424
MDC IDC LEAD IMPLANT DT: 20140424
MDC IDC LEAD LOCATION: 753858
MDC IDC LEAD LOCATION: 753860
MDC IDC MSMT LEADCHNL LV IMPEDANCE VALUE: 812.5 Ohm
MDC IDC MSMT LEADCHNL LV PACING THRESHOLD AMPLITUDE: 1 V
MDC IDC MSMT LEADCHNL LV PACING THRESHOLD PULSEWIDTH: 0.5 ms
MDC IDC MSMT LEADCHNL RA SENSING INTR AMPL: 0.7 mV
MDC IDC MSMT LEADCHNL RV IMPEDANCE VALUE: 437.5 Ohm
MDC IDC MSMT LEADCHNL RV PACING THRESHOLD AMPLITUDE: 0.75 V
MDC IDC MSMT LEADCHNL RV PACING THRESHOLD PULSEWIDTH: 0.5 ms
MDC IDC MSMT LEADCHNL RV SENSING INTR AMPL: 12 mV
MDC IDC PG SERIAL: 2926516
MDC IDC SET LEADCHNL LV PACING PULSEWIDTH: 0.5 ms
MDC IDC SET LEADCHNL RV SENSING SENSITIVITY: 12.5 mV
MDC IDC STAT BRADY RA PERCENT PACED: 0 %

## 2016-06-24 ENCOUNTER — Ambulatory Visit (INDEPENDENT_AMBULATORY_CARE_PROVIDER_SITE_OTHER): Payer: Medicare Other | Admitting: *Deleted

## 2016-06-24 DIAGNOSIS — Z5181 Encounter for therapeutic drug level monitoring: Secondary | ICD-10-CM

## 2016-06-24 DIAGNOSIS — I2510 Atherosclerotic heart disease of native coronary artery without angina pectoris: Secondary | ICD-10-CM

## 2016-06-24 DIAGNOSIS — I4891 Unspecified atrial fibrillation: Secondary | ICD-10-CM | POA: Diagnosis not present

## 2016-06-24 LAB — POCT INR: INR: 1.3

## 2016-06-30 ENCOUNTER — Ambulatory Visit (INDEPENDENT_AMBULATORY_CARE_PROVIDER_SITE_OTHER): Payer: Medicare Other | Admitting: *Deleted

## 2016-06-30 DIAGNOSIS — I4891 Unspecified atrial fibrillation: Secondary | ICD-10-CM | POA: Diagnosis not present

## 2016-06-30 DIAGNOSIS — I2510 Atherosclerotic heart disease of native coronary artery without angina pectoris: Secondary | ICD-10-CM | POA: Diagnosis not present

## 2016-06-30 DIAGNOSIS — Z5181 Encounter for therapeutic drug level monitoring: Secondary | ICD-10-CM | POA: Diagnosis not present

## 2016-06-30 LAB — POCT INR: INR: 2.2

## 2016-07-14 ENCOUNTER — Ambulatory Visit (INDEPENDENT_AMBULATORY_CARE_PROVIDER_SITE_OTHER): Payer: Medicare Other | Admitting: *Deleted

## 2016-07-14 DIAGNOSIS — Z5181 Encounter for therapeutic drug level monitoring: Secondary | ICD-10-CM | POA: Diagnosis not present

## 2016-07-14 DIAGNOSIS — I4891 Unspecified atrial fibrillation: Secondary | ICD-10-CM

## 2016-07-14 DIAGNOSIS — I2510 Atherosclerotic heart disease of native coronary artery without angina pectoris: Secondary | ICD-10-CM

## 2016-07-14 LAB — POCT INR: INR: 1.4

## 2016-07-22 ENCOUNTER — Ambulatory Visit (INDEPENDENT_AMBULATORY_CARE_PROVIDER_SITE_OTHER): Payer: Medicare Other | Admitting: *Deleted

## 2016-07-22 DIAGNOSIS — I4891 Unspecified atrial fibrillation: Secondary | ICD-10-CM | POA: Diagnosis not present

## 2016-07-22 DIAGNOSIS — I2510 Atherosclerotic heart disease of native coronary artery without angina pectoris: Secondary | ICD-10-CM

## 2016-07-22 DIAGNOSIS — Z5181 Encounter for therapeutic drug level monitoring: Secondary | ICD-10-CM

## 2016-07-22 LAB — POCT INR: INR: 2.2

## 2016-08-10 ENCOUNTER — Ambulatory Visit (INDEPENDENT_AMBULATORY_CARE_PROVIDER_SITE_OTHER): Payer: Medicare Other | Admitting: *Deleted

## 2016-08-10 DIAGNOSIS — Z5181 Encounter for therapeutic drug level monitoring: Secondary | ICD-10-CM | POA: Diagnosis not present

## 2016-08-10 DIAGNOSIS — I4891 Unspecified atrial fibrillation: Secondary | ICD-10-CM

## 2016-08-10 DIAGNOSIS — I2510 Atherosclerotic heart disease of native coronary artery without angina pectoris: Secondary | ICD-10-CM | POA: Diagnosis not present

## 2016-08-10 LAB — POCT INR: INR: 1.7

## 2016-08-13 ENCOUNTER — Encounter (HOSPITAL_COMMUNITY): Payer: Self-pay

## 2016-08-13 ENCOUNTER — Ambulatory Visit (HOSPITAL_COMMUNITY)
Admission: RE | Admit: 2016-08-13 | Discharge: 2016-08-13 | Disposition: A | Discharge status: Home / Self Care | Payer: Medicare Other | Source: Ambulatory Visit | Attending: Cardiology | Admitting: Cardiology

## 2016-08-13 VITALS — BP 96/60 | HR 73 | Wt 148.8 lb

## 2016-08-13 DIAGNOSIS — I481 Persistent atrial fibrillation: Secondary | ICD-10-CM | POA: Diagnosis not present

## 2016-08-13 DIAGNOSIS — M353 Polymyalgia rheumatica: Secondary | ICD-10-CM | POA: Insufficient documentation

## 2016-08-13 DIAGNOSIS — I428 Other cardiomyopathies: Secondary | ICD-10-CM | POA: Insufficient documentation

## 2016-08-13 DIAGNOSIS — N400 Enlarged prostate without lower urinary tract symptoms: Secondary | ICD-10-CM | POA: Insufficient documentation

## 2016-08-13 DIAGNOSIS — Z950 Presence of cardiac pacemaker: Secondary | ICD-10-CM | POA: Insufficient documentation

## 2016-08-13 DIAGNOSIS — Z79899 Other long term (current) drug therapy: Secondary | ICD-10-CM | POA: Diagnosis not present

## 2016-08-13 DIAGNOSIS — K219 Gastro-esophageal reflux disease without esophagitis: Secondary | ICD-10-CM | POA: Insufficient documentation

## 2016-08-13 DIAGNOSIS — I5022 Chronic systolic (congestive) heart failure: Secondary | ICD-10-CM | POA: Diagnosis not present

## 2016-08-13 DIAGNOSIS — Z8249 Family history of ischemic heart disease and other diseases of the circulatory system: Secondary | ICD-10-CM | POA: Insufficient documentation

## 2016-08-13 DIAGNOSIS — I442 Atrioventricular block, complete: Secondary | ICD-10-CM | POA: Insufficient documentation

## 2016-08-13 DIAGNOSIS — Z87891 Personal history of nicotine dependence: Secondary | ICD-10-CM | POA: Insufficient documentation

## 2016-08-13 DIAGNOSIS — Z7901 Long term (current) use of anticoagulants: Secondary | ICD-10-CM | POA: Insufficient documentation

## 2016-08-13 DIAGNOSIS — E785 Hyperlipidemia, unspecified: Secondary | ICD-10-CM | POA: Insufficient documentation

## 2016-08-13 LAB — BASIC METABOLIC PANEL
Anion gap: 10 (ref 5–15)
BUN: 26 mg/dL — AB (ref 6–20)
CALCIUM: 9.4 mg/dL (ref 8.9–10.3)
CO2: 25 mmol/L (ref 22–32)
CREATININE: 1.03 mg/dL (ref 0.61–1.24)
Chloride: 102 mmol/L (ref 101–111)
GFR calc Af Amer: >60 mL/min (ref >60)
Glucose, Bld: 97 mg/dL (ref 65–99)
POTASSIUM: 4.3 mmol/L (ref 3.5–5.1)
SODIUM: 137 mmol/L (ref 135–145)

## 2016-08-13 LAB — CBC
HEMATOCRIT: 38.8 % — AB (ref 39.0–52.0)
Hemoglobin: 13.2 g/dL (ref 13.0–17.0)
MCH: 30.3 pg (ref 26.0–34.0)
MCHC: 34 g/dL (ref 30.0–36.0)
MCV: 89.2 fL (ref 78.0–100.0)
PLATELETS: 200 10*3/uL (ref 150–400)
RBC: 4.35 MIL/uL (ref 4.22–5.81)
RDW: 14.5 % (ref 11.5–15.5)
WBC: 5.8 10*3/uL (ref 4.0–10.5)

## 2016-08-13 NOTE — Patient Instructions (Signed)
Labs today  We will contact you in 4 months to schedule your next appointment.  

## 2016-08-14 NOTE — Progress Notes (Signed)
Patient ID: [name], male   DOB: May 29, 1929, 83 y.o.   MRN: [id number] PCP: Dr. Yong Channel Cardiology: Dr. Aundra Dubin  83 yo with history of paroxysmal atrial fibrillation, nonischemic cardiomyopathy, and complete heart block with St Jude CRT-P system presents for cardiology followup.  He was admitted in 4/14 with pacemaker pocket infection.  His pacemaker was removed and temporary-permanent device was placed.  Later, he had the temporary-permanent device removed and a new CRT-P device was placed. He developed dyspnea post-operatively and was found to have a large right-sided pleural effusion.  He was admitted and got a chest tube for drainage. He had followup with Dr. Servando Snare and it was decided that he would not need VATS.  He had been on amiodarone for maintenance of NSR.  This had been more successful than Tikosyn.   Around 3/15, he started developing increasing exertional dyspnea.  He was short of breath walking up a hill or incline.  He could still walk on the treadmill for exercise for 10-15 minutes and use the elliptical without much trouble on most days.  No orthopnea or PND.  No chest pain.  He saw Dr Leanne Chang and was started on Lasix three times a week due to concern for volume overload.  This did not seem to have helped much.  After that, he had PFTs done which showed normal spirometry but DLCO 50% predicted.  Therefore, amiodarone was stopped due to concern for lung toxicity.  He saw Richardson Dopp for a pre-operative evaluation prior to right inguinal hernia repair.  Given the exertional dyspnea, he was set up for adenosine Cardiolite in 5/15.  This showed EF 36% with a small area of primarily scar in the apex, apical lateral wall, and mid anterolateral wall.  He has not felt palpitations. Echo in 5/15 showed EF 40-45%, similar to prior study.   I had him see pulmonary for evaluation for amiodarone lung toxicity.  CT chest looked ok, and he was not thought to have amiodarone lung toxicity.   At a  prior appointment in 2016, Mr Klas was noted to be back in atrial fibrillation.  He was more short of breath with exertion and fatigued.  He has historically tolerated atrial fibrillation poorly.  I did a TEE-guided DCCV in 5/16 back to NSR.  TEE showed EF 35-40%.  He did not immediately feel better like he has in the past post-DCCV, so Lasix was increased.    Recurrent symptomatic atrial fibrillation in 8/17 with TEE-guided DCCV to NSR.  TEE showed EF 25-30%.  Recurrent symptomatic atrial fibrillation with CHF exacerbation in 9/17.  He had repeat DCCV and Lasix was increased.   He was back in atrial fibrillation in 11/17 and appears to have been out of rhythm since then.  He was more short of breath in atrial fibrillation with a significant fall in BiV pacing percentage.  Lasix was significantly increased to try to cope with volume overload.  In 2/18, he had AV nodal ablation to promote BiV pacing.    Since AV nodal ablation, he feels much better.  Weight is stable.  He can walk up a hill without much problem.  He is only short of breath after walking 2 or more flights of steps.  No dyspnea on flat ground.  No orthopnea/PND, no bendopnea.  We cut back on Lasix with elevated creatinine, he does not feel like he has been retaining fluid since then.  No lightheadedness.  No edema. He is taking a balance class  and an aerobics class at [employer]     Labs (7/13); LDL 65, HDL 38 Labs (3/14): TSH normal Labs (4/14):  ALT 61, AST 62 Labs (5/14): K 4.5, creatinine 0.9, BNP 727, digoxin 0.9 Labs (6/14): LDL 77, HDL 34, K 4, creatinine 0.9, TSH normal Labs (10/14): digoxin 2.1, K 4.7, creatinine 1.0, TSH normal, LFTs normal Labs (3/15): K 4, creatinine 1.1 Labs (4/15): BNP 482 Labs (5/15): K 3.8, creatinine 1.2 Labs (6/15): K 4.3, creatinine 0.9 Labs (7/15): TSH normal, LFTs normal Labs (9/15): TSH normal, LFTs normal Labs (1/16): K 4.3, creatinine 1.07 Labs (5/16): TSH normal, LFTs normal, K 4.4,  creatinine 1.02 Labs (6/16): K 4.4, creatinine 1.29 Labs (9/16): LDL 92, LFTs normal Labs (10/16): K 4.5, creatinine 1.07 Labs (3/17): TSH normal Labs (9/17): K 3.4, creatinine 1.32 => 1.31, BNP 837 => 639 Labs (1/18): K 4.3, creatinine 1.26 Labs (2/18): K 4.5, creatinine 1.33 Labs (3/18): K 4.6, creatinine 1.52, BNP 329  PMH: 1. Low back pain 2. Hyperlipidemia 3. GERD 4. BPH 5. Polymyalgia rheumatica 6. Atrial fibrillation: Failed Tikosyn in the past.  Paroxysmal, on amiodarone and warfarin.  DCCV in 4/13, 7/13, 10/13.  Has held NSR with amiodarone.  However, concern for amiodarone lung toxicity: PFTs (4/15) with FVC 90% predicted, FEV1 90%, ratio 97%, DLCO 50%.   Recurrence of atrial fibrillation in 5/16, TEE-guided DCCV back to NSR in 5/16.  - Recurrence of atrial fibrillation in 8/17. TEE-guided DCCV 11/07/15.  - Recurrence of atrial fibrillation 9/17.  DCCV 11/20/15 but back in atrial fibrillation by 11/26/15.  - Amiodarone stopped due to persistent atrial fibrillation.  - AV nodal ablation in 2/18 to promote BiV pacing.  7. Transaminitis: Mild, attributed to amiodarone.  8. Complete heart block s/p St Jude CRT-P device.  Patient developed PCM pocket infection in 4/14 and had his first system removed with placement of a temporary permanent PCM.  He later had CRT-P device replaced.  This was complicated by large right-sided hemorrhagic pleural effusion requiring chest tube.   9. Nonischemic cardiomyopathy: Echo (9/13) with EF 40-45%, diffuse HK, mild MR.  Mild CAD only on prior LHC.  Echo (4/14) with EF 45-50%.  Adenosine Cardiolite (5/15) with EF 36% (visually appeared higher per report), small area of scar with peri-infarct ischemia in the apex, apical lateral wall, and mid anterolateral wall. Echo (5/15) with EF 40-45%, basal to mid inferolateral severe hypokinesis, basal to mid anterolateral hypokinesis, mildly dilated RV with mildly decreased RV systolic function, mild to moderate MR.   TEE (5/16) with EF 35-40%, inferior and inferolateral severe hypokinesis, normal RV size with mildly decreased systolic function, moderate MR, peak RV-RA gradient 26 mmHg.  - TEE (8/17): EF 25-30%, mildly dilated RV with mildly decreased systolic function, moderate biatrial enlargement, moderate central MR.  - AV nodal ablation 2/18.  10. Large right pleural effusion requiring chest tube following removal and reimplantation of PCM. Most recent chest CT in 6/15 showed significant decrease in size of loculated pleural effusion since 5/14.    SH: Married, prior smoker (quit 1985), no ETOH x years, retired, lives at [employer]  [place]: Father with MI at 26, mother with AAA.   ROS: All systems reviewed and negative except as per HPI.   Current Outpatient Prescriptions  Medication Sig Dispense Refill  . acetaminophen (TYLENOL) 500 MG tablet Take 1,000 mg by mouth every 6 (six) hours as needed for moderate pain or headache.    . fluticasone (FLONASE) 50 MCG/ACT nasal spray Place  2 sprays into both nostrils daily as needed for allergies or rhinitis.    . furosemide (LASIX) 80 MG tablet Take 1 tablet (80 mg total) by mouth daily. 90 tablet 3  . losartan (COZAAR) 25 MG tablet Take 0.5 tablets (12.5 mg total) by mouth daily. 90 tablet 3  . magnesium oxide (MAG-OX) 400 MG tablet Take 400 mg by mouth daily.    . metoprolol succinate (TOPROL-XL) 25 MG 24 hr tablet Take 0.5 tablets (12.5 mg total) by mouth at bedtime. 15 tablet 3  . potassium chloride SA (K-DUR,KLOR-CON) 20 MEQ tablet Take 1 tablet (20 mEq total) by mouth 2 (two) times daily. 180 tablet 2  . pravastatin (PRAVACHOL) 80 MG tablet Take 0.5 tablets (40 mg total) by mouth daily. 45 tablet 3  . tamsulosin (FLOMAX) 0.4 MG CAPS capsule Take 1 capsule (0.4 mg total) by mouth daily. 90 capsule 3  . warfarin (COUMADIN) 5 MG tablet TAKE 1/2 TO 1 TABLET (2.5 TO 5 MG TOTAL) DAILY AS DIRECTED BY ANTICOAGULATION CLINIC (Patient taking differently: Take 2.5mg s  daily except on Mondays take 5mg s) 90 tablet 3  . spironolactone (ALDACTONE) 25 MG tablet Take 0.5 tablets (12.5 mg total) by mouth every evening. 45 tablet 3   No current facility-administered medications for this encounter.     BP 96/60   Pulse 73   Wt 148 lb 12 oz (67.5 kg)   SpO2 97%   BMI 24.01 kg/m  General: NAD Neck: JVP not elevated, no thyromegaly or thyroid nodule.  Lungs: Clear to auscultation bilaterally.  CV: Nondisplaced PMI.  Heart regular S1/S2, no S3/S4, no murmur.  No edema.  No carotid bruit.  Normal pedal pulses.  Abdomen: Soft, nontender, no hepatosplenomegaly, no distention.  Neurologic: Alert and oriented x 3.  Psych: Normal affect. Extremities: No clubbing or cyanosis.   Assessment/Plan: 1. Chronic systolic CHF:  Presumed nonischemic cardiomyopathy (prior cath with only mild CAD).  He had an adenosine Cardiolite in 5/15 with EF 36% and possible small area of apical and lateral scar with peri-infarct ischemia. He has St Jude CRT-P.  TEE in 8/17 showed EF 25-30% . He seems to have tolerated persistent atrial fibrillation poorly with a significant fall in BiV pacing percentage.  However, after AV nodal ablation in 2/18, BiV pacing percentage went up to > 99%.  He feels great today.  NYHA class II symptoms with no volume overload.  No longer having orthostatic symptoms and tolerated decrease in Lasix dose without problems.   - Continue Lasix 80 mg daily, BMET today.    - Continue current Toprol XL, losartan, and spironolactone.  2. Atrial fibrillation: Now persistent.  Had breakthrough with Tikosyn and now not able to hold NSR on amiodarone. He is now off amiodarone.  He is status post AV nodal ablation with significant improvement in symptoms and BiV pacing percentage.  - Continue warfarin.  3. Hyperlipidemia: History of nonobstructive CAD.  Continue pravastatin.    Followup in 4 months.   [name] 08/14/2016

## 2016-08-14 NOTE — Addendum Note (Signed)
Encounter addended by: Larey Dresser, MD on: 08/14/2016  4:48 PM<BR>    Actions taken: LOS modified

## 2016-08-25 ENCOUNTER — Ambulatory Visit (INDEPENDENT_AMBULATORY_CARE_PROVIDER_SITE_OTHER): Payer: Medicare Other | Admitting: Family Medicine

## 2016-08-25 ENCOUNTER — Ambulatory Visit (INDEPENDENT_AMBULATORY_CARE_PROVIDER_SITE_OTHER): Payer: Medicare Other | Admitting: *Deleted

## 2016-08-25 ENCOUNTER — Telehealth (HOSPITAL_COMMUNITY): Payer: Self-pay | Admitting: *Deleted

## 2016-08-25 ENCOUNTER — Encounter: Payer: Self-pay | Admitting: Family Medicine

## 2016-08-25 ENCOUNTER — Telehealth: Payer: Self-pay

## 2016-08-25 VITALS — BP 104/58 | HR 69 | Temp 97.9°F | Ht 66.0 in | Wt 149.6 lb

## 2016-08-25 DIAGNOSIS — M79602 Pain in left arm: Secondary | ICD-10-CM | POA: Diagnosis not present

## 2016-08-25 DIAGNOSIS — I2510 Atherosclerotic heart disease of native coronary artery without angina pectoris: Secondary | ICD-10-CM

## 2016-08-25 DIAGNOSIS — Z5181 Encounter for therapeutic drug level monitoring: Secondary | ICD-10-CM

## 2016-08-25 DIAGNOSIS — I4891 Unspecified atrial fibrillation: Secondary | ICD-10-CM

## 2016-08-25 LAB — POCT INR: INR: 3

## 2016-08-25 MED ORDER — PREDNISONE 20 MG PO TABS: ORAL_TABLET | ORAL | 0 refills | Status: DC

## 2016-08-25 NOTE — Patient Instructions (Addendum)
Unclear cause of pain in the left arm. Does not sound like your heart. Could be coming from the neck. Do not think it is related to your hand contracture  Trial course of low dose prednisone to see if antiinflammatory may help.

## 2016-08-25 NOTE — Telephone Encounter (Signed)
Pt called with c/o left sided chest pain that is radiating down his arm. He denies jaw pain, ShOB or headache. Advised pt to seek immediate ED evaluation but pt does not want to go. Advised he can call cardiology and see if they have immediate opening, but if they do not he needs to proceed to ED. Pt agrees. Nothing further needed at this time.   Dr. Yong Channel - FYI thanks!

## 2016-08-25 NOTE — Progress Notes (Signed)
Subjective:  [name] is a 83 y.o. year old very pleasant male patient who presents for/with See problem oriented charting ROS- No chest pain or shortness of breath. No headache or blurry vision. Does have left arm and shoulder pain as well as paresthesias but no weakness   Past Medical History-  Patient Active Problem List   Diagnosis Date Noted  . Chronic systolic CHF (congestive heart failure) ([place]) 03/21/2013    Priority: High  . Nonischemic cardiomyopathy ([place]) 09/20/2010    Priority: High  . PPM-St.Jude 11/27/2008    Priority: High  . CAD (coronary artery disease) 07/20/2008    Priority: High  . SICK SINUS SYNDROME 07/20/2008    Priority: High  . Atrial fibrillation ([place]) 03/16/2007    Priority: High  . AK (actinic keratosis) 01/28/2016    Priority: Medium  . Lipoma 07/08/2015    Priority: Medium  . Neuropathy 05/16/2015    Priority: Medium  . Chronic cough 08/18/2013    Priority: Medium  . Pleural effusion, right 08/16/2013    Priority: Medium  . Pleural effusion on right 07/06/2012    Priority: Medium  . Hyperlipidemia 03/16/2007    Priority: Medium  . BPH (benign prostatic hyperplasia) 03/16/2007    Priority: Medium  . Left breast lump 07/15/2015    Priority: Low  . Encounter for therapeutic drug monitoring 07/02/2014    Priority: Low  . Allergic rhinitis 02/14/2014    Priority: Low  . Fecal incontinence 10/18/2013    Priority: Low  . Recurrent right inguinal hernia 11/22/2012    Priority: Low  . Intention tremor 11/01/2012    Priority: Low  . Polymyalgia rheumatica ([place]) 07/03/2008    Priority: Low  . MIXED HEARING LOSS BILATERAL 01/10/2008    Priority: Low  . GERD 03/16/2007    Priority: Low  . Prolapsed internal hemorrhoids, grade 2 - causing fecal seepage 06/10/2016  . Atrial fibrillation with tachycardic ventricular rate ([place]) 04/20/2016  . Chronic anticoagulation 02/10/2016  . Trigger thumb of right hand 07/04/2014    Medications-  reviewed and updated Current Outpatient Prescriptions  Medication Sig Dispense Refill  . acetaminophen (TYLENOL) 500 MG tablet Take 1,000 mg by mouth every 6 (six) hours as needed for moderate pain or headache.    . fluticasone (FLONASE) 50 MCG/ACT nasal spray Place 2 sprays into both nostrils daily as needed for allergies or rhinitis.    . furosemide (LASIX) 80 MG tablet Take 1 tablet (80 mg total) by mouth daily. 90 tablet 3  . losartan (COZAAR) 25 MG tablet Take 0.5 tablets (12.5 mg total) by mouth daily. 90 tablet 3  . magnesium oxide (MAG-OX) 400 MG tablet Take 400 mg by mouth daily.    . metoprolol succinate (TOPROL-XL) 25 MG 24 hr tablet Take 0.5 tablets (12.5 mg total) by mouth at bedtime. 15 tablet 3  . potassium chloride SA (K-DUR,KLOR-CON) 20 MEQ tablet Take 1 tablet (20 mEq total) by mouth 2 (two) times daily. 180 tablet 2  . pravastatin (PRAVACHOL) 80 MG tablet Take 0.5 tablets (40 mg total) by mouth daily. 45 tablet 3  . spironolactone (ALDACTONE) 25 MG tablet Take 0.5 tablets (12.5 mg total) by mouth every evening. 45 tablet 3  . tamsulosin (FLOMAX) 0.4 MG CAPS capsule Take 1 capsule (0.4 mg total) by mouth daily. 90 capsule 3  . warfarin (COUMADIN) 5 MG tablet TAKE 1/2 TO 1 TABLET (2.5 TO 5 MG TOTAL) DAILY AS DIRECTED BY ANTICOAGULATION CLINIC (Patient taking differently: Take  2.5mg s daily except on Mondays take 5mg s) 90 tablet 3  . predniSONE (DELTASONE) 20 MG tablet Take 1 tablet by mouth daily for 5 days, then 1/2 tablet daily for 2 days 6 tablet 0   No current facility-administered medications for this visit.     Objective: BP (!) 104/58 (BP Location: Left Arm, Patient Position: Sitting, Cuff Size: Large)   Pulse 69   Temp 97.9 F (36.6 C) (Oral)   Ht 5\' 6"  (1.676 m)   Wt 149 lb 9.6 oz (67.9 kg)   SpO2 98%   BMI 24.15 kg/m  Gen: NAD, resting comfortably CV: RRR no murmurs rubs or gallops Lungs: CTAB no crackles, wheeze, rhonchi Abdomen:  soft/nontender/nondistended/normal bowel sounds. No rebound or guarding.  Ext: no edema Skin: warm, dry Neuro: 5/5 strength bilateral upper extremities  No pain with palpation of neck musculature  Spurling test does not reproduce pain or paresthesisas  Left Shoulder: Inspection reveals no abnormalities, atrophy or asymmetry. Palpation is normal with no tenderness over AC joint or bicipital groove. ROM is full in all planes. Rotator cuff strength normal throughout. No signs of impingement with negative Neer and Hawkin's tests, empty can. No painful arc and no drop arm sign. No apprehension sign  Assessment/Plan:  Left arm pain S: left arm pain that comes and goes for 1-2 weeks. No chest pain, does not feel bad. Gets some numbness/tingling at night but worse in left arm and down to hand. Soreness in left neck. Described as an ache in the arm and if he rests it it seems to get better, worse with regular use- aching like feeling. Up to 4/10 pain. Tylenol or aspirin knock the pain out.   No pain in the arm with exertion such as walking or going upstaris.   Holds ipad up at night and has some pain into the left hand along his dupuytren's contracture.  A/P: possible cervical radiculopathy though spurling negative. Reasonable to trial course of prednisone and if not improved- refer to orthopedics- patient agrees. No exertional symptoms to suggest cardiac disease. Verbal return precautions given for new or worsening symptoms particularly chest pain or shortness of breath  Meds ordered this encounter  Medications  . predniSONE (DELTASONE) 20 MG tablet    Sig: Take 1 tablet by mouth daily for 5 days, then 1/2 tablet daily for 2 days    Dispense:  6 tablet    Refill:  0  high risk patient with cardiac history but once again do not believe this is cardiac in origin  Return precautions advised.  Garret Reddish, MD

## 2016-08-25 NOTE — Telephone Encounter (Signed)
dont see him on schedule yet- please check back in with him

## 2016-08-25 NOTE — Telephone Encounter (Signed)
Per cardiology pt needs to see PCP. Pt scheduled to see you at 4pm today.  Thanks!

## 2016-08-25 NOTE — Telephone Encounter (Signed)
Patient called stating he was having left arm pain but no redness or swelling in arm.  Patient also stated he wasn't having any shortness of breath.  I advised him to call his PCP as it this didn't sound related to his heart.  He agreed with me and stated he would call his PCP.  No further questions at this time.

## 2016-09-01 ENCOUNTER — Telehealth: Payer: Self-pay | Admitting: Family Medicine

## 2016-09-01 NOTE — Telephone Encounter (Signed)
° ° ° °  Pt said he does not have an orthopedist but will call back about the referral

## 2016-09-01 NOTE — Telephone Encounter (Signed)
Pt was seen on 6-19 for left arm pain. Pt has finished prednisone last night and pain has return and is worst. Please advise

## 2016-09-01 NOTE — Telephone Encounter (Signed)
Does he have an orthopedist he sees? I would advise him to see them. If not- we can place a referral

## 2016-09-02 ENCOUNTER — Ambulatory Visit (INDEPENDENT_AMBULATORY_CARE_PROVIDER_SITE_OTHER): Payer: Medicare Other | Admitting: *Deleted

## 2016-09-02 ENCOUNTER — Other Ambulatory Visit: Payer: Self-pay

## 2016-09-02 ENCOUNTER — Telehealth: Payer: Self-pay | Admitting: Cardiology

## 2016-09-02 DIAGNOSIS — M79602 Pain in left arm: Secondary | ICD-10-CM

## 2016-09-02 DIAGNOSIS — I495 Sick sinus syndrome: Secondary | ICD-10-CM | POA: Diagnosis not present

## 2016-09-02 NOTE — Telephone Encounter (Signed)
Spoke with pt and reminded pt of remote transmission that is due today. Pt verbalized understanding.   

## 2016-09-02 NOTE — Telephone Encounter (Signed)
Referral placed for Orthopedics. I am calling the patient to make him aware.

## 2016-09-03 ENCOUNTER — Encounter: Payer: Self-pay | Admitting: Cardiology

## 2016-09-03 LAB — CUP PACEART REMOTE DEVICE CHECK
Battery Voltage: 2.95 V
Date Time Interrogation Session: 20180627153821
Implantable Lead Implant Date: 20140424
Implantable Lead Location: 753858
Implantable Lead Location: 753859
Implantable Lead Location: 753860
Implantable Pulse Generator Implant Date: 20140422
Lead Channel Impedance Value: 460 Ohm
Lead Channel Pacing Threshold Amplitude: 1 V
Lead Channel Pacing Threshold Pulse Width: 0.5 ms
Lead Channel Sensing Intrinsic Amplitude: 12 mV
Lead Channel Setting Pacing Amplitude: 2.5 V
Lead Channel Setting Pacing Pulse Width: 0.5 ms
Lead Channel Setting Sensing Sensitivity: 12.5 mV
MDC IDC LEAD IMPLANT DT: 20140422
MDC IDC LEAD IMPLANT DT: 20140424
MDC IDC MSMT BATTERY REMAINING LONGEVITY: 89 mo
MDC IDC MSMT BATTERY REMAINING PERCENTAGE: 91 %
MDC IDC MSMT LEADCHNL LV IMPEDANCE VALUE: 960 Ohm
MDC IDC MSMT LEADCHNL RV PACING THRESHOLD AMPLITUDE: 0.75 V
MDC IDC MSMT LEADCHNL RV PACING THRESHOLD PULSEWIDTH: 0.5 ms
MDC IDC PG SERIAL: 2926516
MDC IDC SET LEADCHNL LV PACING AMPLITUDE: 2.5 V
MDC IDC SET LEADCHNL LV PACING PULSEWIDTH: 0.5 ms
Pulse Gen Model: 3210

## 2016-09-03 NOTE — Progress Notes (Signed)
Remote pacemaker transmission.   

## 2016-09-08 DIAGNOSIS — M542 Cervicalgia: Secondary | ICD-10-CM | POA: Diagnosis not present

## 2016-09-14 ENCOUNTER — Ambulatory Visit (INDEPENDENT_AMBULATORY_CARE_PROVIDER_SITE_OTHER): Payer: Medicare Other

## 2016-09-14 DIAGNOSIS — I4891 Unspecified atrial fibrillation: Secondary | ICD-10-CM | POA: Diagnosis not present

## 2016-09-14 DIAGNOSIS — I2510 Atherosclerotic heart disease of native coronary artery without angina pectoris: Secondary | ICD-10-CM | POA: Diagnosis not present

## 2016-09-14 DIAGNOSIS — Z5181 Encounter for therapeutic drug level monitoring: Secondary | ICD-10-CM

## 2016-09-14 LAB — POCT INR: INR: 2.6

## 2016-09-17 ENCOUNTER — Encounter: Payer: Self-pay | Admitting: Cardiology

## 2016-09-30 DIAGNOSIS — Z961 Presence of intraocular lens: Secondary | ICD-10-CM | POA: Diagnosis not present

## 2016-09-30 DIAGNOSIS — H52203 Unspecified astigmatism, bilateral: Secondary | ICD-10-CM | POA: Diagnosis not present

## 2016-10-09 ENCOUNTER — Ambulatory Visit (INDEPENDENT_AMBULATORY_CARE_PROVIDER_SITE_OTHER): Payer: Medicare Other | Admitting: *Deleted

## 2016-10-09 DIAGNOSIS — I4891 Unspecified atrial fibrillation: Secondary | ICD-10-CM

## 2016-10-09 DIAGNOSIS — I2510 Atherosclerotic heart disease of native coronary artery without angina pectoris: Secondary | ICD-10-CM

## 2016-10-09 DIAGNOSIS — Z5181 Encounter for therapeutic drug level monitoring: Secondary | ICD-10-CM

## 2016-10-09 LAB — POCT INR: INR: 2.5

## 2016-10-15 DIAGNOSIS — M25562 Pain in left knee: Secondary | ICD-10-CM | POA: Diagnosis not present

## 2016-10-19 ENCOUNTER — Other Ambulatory Visit: Payer: Self-pay | Admitting: Orthopedic Surgery

## 2016-10-19 DIAGNOSIS — M25562 Pain in left knee: Secondary | ICD-10-CM | POA: Diagnosis not present

## 2016-10-20 ENCOUNTER — Telehealth (HOSPITAL_COMMUNITY): Payer: Self-pay | Admitting: *Deleted

## 2016-10-20 NOTE — Telephone Encounter (Signed)
Patient called and left VM on triage line asking for [place] to call him.  I tried calling him back but no answer, left VM.

## 2016-10-21 ENCOUNTER — Telehealth (HOSPITAL_COMMUNITY): Payer: Self-pay | Admitting: *Deleted

## 2016-10-21 NOTE — Telephone Encounter (Signed)
Clearance faxed

## 2016-10-21 NOTE — Telephone Encounter (Signed)
Entered in error

## 2016-10-21 NOTE — Telephone Encounter (Signed)
Patient called inquiring about his surgical clearance form that was faxed Monday from [facility] for [place] to sign. I located the paper in Dr.McLean's "to be signed" folder and gave to Susie to ensure this was handled today.  Notified patient that we did receive the fax and would take care of it today.

## 2016-10-22 ENCOUNTER — Telehealth: Payer: Self-pay | Admitting: Pharmacist

## 2016-10-22 NOTE — Telephone Encounter (Signed)
Mardene Celeste from [facility] LM to inquire about holding patient's coumadin for 4 days prior to arthrogram (unscheduled).  Patient with diagnosis of Afib on warfarin for anticoagulation.    Procedure: arthrogram Date of procedure: unscheduled  CHADS2 score of 2 (CHF, HTN, AGE, DM2, stroke/tia x 2);  CHADS2-VASc score of  4 (CHF, HTN, AGE, DM2, stroke/tia x 2, CAD, AGE, male)  Per office protocol, patient can hold warfarin for 4 days prior to procedure as per request.   Patient will not need bridging with Lovenox (enoxaparin) around procedure.  Patient should restart warfarin on the evening of procedure or day after, at discretion of procedure MD   Called back to [place] and LM with above.

## 2016-10-23 ENCOUNTER — Ambulatory Visit (INDEPENDENT_AMBULATORY_CARE_PROVIDER_SITE_OTHER): Payer: Medicare Other | Admitting: Family Medicine

## 2016-10-23 ENCOUNTER — Encounter: Payer: Self-pay | Admitting: Family Medicine

## 2016-10-23 DIAGNOSIS — M25562 Pain in left knee: Secondary | ICD-10-CM | POA: Insufficient documentation

## 2016-10-23 DIAGNOSIS — I2510 Atherosclerotic heart disease of native coronary artery without angina pectoris: Secondary | ICD-10-CM | POA: Diagnosis not present

## 2016-10-23 NOTE — Patient Instructions (Signed)
Placed referral for you for [facility]

## 2016-10-23 NOTE — Progress Notes (Signed)
Subjective:  [name] is a 83 y.o. year old very pleasant male patient who presents for/with See problem oriented charting ROS- no redness around knee other than at site of abrasion- no warmth there. Has knee pain. No calf pain or tenderness. No pretibial edema.  Not lightheaded with current BP  Past Medical History-  Patient Active Problem List   Diagnosis Date Noted  . Chronic systolic CHF (congestive heart failure) ([place]) 03/21/2013    Priority: High  . Nonischemic cardiomyopathy ([place]) 09/20/2010    Priority: High  . PPM-St.Jude 11/27/2008    Priority: High  . CAD (coronary artery disease) 07/20/2008    Priority: High  . SICK SINUS SYNDROME 07/20/2008    Priority: High  . Atrial fibrillation ([place]) 03/16/2007    Priority: High  . AK (actinic keratosis) 01/28/2016    Priority: Medium  . Lipoma 07/08/2015    Priority: Medium  . Neuropathy 05/16/2015    Priority: Medium  . Chronic cough 08/18/2013    Priority: Medium  . Pleural effusion, right 08/16/2013    Priority: Medium  . Pleural effusion on right 07/06/2012    Priority: Medium  . Hyperlipidemia 03/16/2007    Priority: Medium  . BPH (benign prostatic hyperplasia) 03/16/2007    Priority: Medium  . Left breast lump 07/15/2015    Priority: Low  . Encounter for therapeutic drug monitoring 07/02/2014    Priority: Low  . Allergic rhinitis 02/14/2014    Priority: Low  . Fecal incontinence 10/18/2013    Priority: Low  . Recurrent right inguinal hernia 11/22/2012    Priority: Low  . Intention tremor 11/01/2012    Priority: Low  . Polymyalgia rheumatica ([place]) 07/03/2008    Priority: Low  . MIXED HEARING LOSS BILATERAL 01/10/2008    Priority: Low  . GERD 03/16/2007    Priority: Low  . Left knee pain 10/23/2016  . Prolapsed internal hemorrhoids, grade 2 - causing fecal seepage 06/10/2016  . Atrial fibrillation with tachycardic ventricular rate ([place]) 04/20/2016  . Chronic anticoagulation 02/10/2016  . Trigger  thumb of right hand 07/04/2014    Medications- reviewed and updated Current Outpatient Prescriptions  Medication Sig Dispense Refill  . acetaminophen (TYLENOL) 500 MG tablet Take 1,000 mg by mouth every 6 (six) hours as needed for moderate pain or headache.    . fluticasone (FLONASE) 50 MCG/ACT nasal spray Place 2 sprays into both nostrils daily as needed for allergies or rhinitis.    . furosemide (LASIX) 80 MG tablet Take 1 tablet (80 mg total) by mouth daily. 90 tablet 3  . losartan (COZAAR) 25 MG tablet Take 0.5 tablets (12.5 mg total) by mouth daily. 90 tablet 3  . magnesium oxide (MAG-OX) 400 MG tablet Take 400 mg by mouth daily.    . metoprolol succinate (TOPROL-XL) 25 MG 24 hr tablet Take 0.5 tablets (12.5 mg total) by mouth at bedtime. 15 tablet 3  . potassium chloride SA (K-DUR,KLOR-CON) 20 MEQ tablet Take 1 tablet (20 mEq total) by mouth 2 (two) times daily. 180 tablet 2  . pravastatin (PRAVACHOL) 80 MG tablet Take 0.5 tablets (40 mg total) by mouth daily. 45 tablet 3  . spironolactone (ALDACTONE) 25 MG tablet Take 0.5 tablets (12.5 mg total) by mouth every evening. 45 tablet 3  . tamsulosin (FLOMAX) 0.4 MG CAPS capsule Take 1 capsule (0.4 mg total) by mouth daily. 90 capsule 3  . warfarin (COUMADIN) 5 MG tablet TAKE 1/2 TO 1 TABLET (2.5 TO 5 MG TOTAL) DAILY  AS DIRECTED BY ANTICOAGULATION CLINIC (Patient taking differently: Take 2.5mg s daily except on Mondays take 5mg s) 90 tablet 3   No current facility-administered medications for this visit.     Objective: BP (!) 88/50 (BP Location: Left Arm, Patient Position: Sitting, Cuff Size: Large)   Pulse 65   Temp 98 F (36.7 C) (Oral)   Ht 5\' 6"  (1.676 m)   Wt 150 lb 12.8 oz (68.4 kg)   SpO2 95%   BMI 24.34 kg/m  Gen: NAD, resting comfortably Lungs: nonlabored Abdomen: soft/nondistended. Normal weight Ext: no edema Skin: warm, dry, abrasion on left knee healing  Left Knee: Normal to inspection with no erythema or effusion or  obvious bony abnormalities. Palpation normal with no warmth or joint line tenderness or patellar tenderness or condyle tenderness. ROM normal in flexion and extension and lower leg rotation. Ligaments with solid consistent endpoints including ACL, PCL, LCL, MCL. Some pain in lateral leg when stressing LCL.  Negative Mcmurray's Non painful patellar compression. Patellar and quadriceps tendons unremarkable. Hamstring and quadriceps strength is normal.    Assessment/Plan:  Acute pain of left knee - Plan: Ambulatory referral to Physical Therapy S: Patient dealing with left knee pain. Tripped on curb and fell onto sidewalk about 3 weeks ago. Managed it for about a week at home then went back into the gym Then last Wednesday (10 days ago)- did higher level of elliptical and also did some leg presses as well as extensions. Was doing something in the kitchen that day - turned and felt immediate pain. swole up that night and couldn't stand on the leg.   Couldn't get into [facility]. Got into [facility] and was given cortisone shot - potential degenerative meniscal tear. This was on last Thursday, saw them back on Monday. He has a Ct arthrogram planned with Dr. French Ana of orthopedics- was told he had to stop his coumadin.   Pain has gotten better- finally not having to walk with a walker today. Using ice 3-4 x a day.   Has abrasion on left knee- but up to date on tetanus A/P: Left knee pain for 10 days- possible meniscal tear degenerative. Slowly improving- finally off of walking with cane. Has ortho follow up and planned ct arthrogram. He wants to try PT to see if that could help as well - apparently a friend had treatment with laser therapy for joint issue and symptoms resolved so he wants to investigate this- already scheduled.  Has appointnment with [place] physical therapy next Wednesday but needs formal referral.  [address] [phone]. Fax [phone]. He wants to see if he improves with  this if he can cancel Ct arthrogram  Patient Instructions  Placed referral for you for [place] [facility]  Orders Placed This Encounter  Procedures  . Ambulatory referral to Physical Therapy    Referral Priority:   Routine    Referral Type:   Physical Medicine    Referral Reason:   Specialty Services Required    Requested Specialty:   Physical Therapy    Number of Visits Requested:   1   Return precautions advised.  Garret Reddish, MD

## 2016-10-28 DIAGNOSIS — M25562 Pain in left knee: Secondary | ICD-10-CM | POA: Diagnosis not present

## 2016-10-30 DIAGNOSIS — M25562 Pain in left knee: Secondary | ICD-10-CM | POA: Diagnosis not present

## 2016-11-02 ENCOUNTER — Ambulatory Visit
Admission: RE | Admit: 2016-11-02 | Discharge: 2016-11-02 | Disposition: A | Discharge status: Home / Self Care | Payer: Medicare Other | Source: Ambulatory Visit | Attending: Orthopedic Surgery | Admitting: Orthopedic Surgery

## 2016-11-02 ENCOUNTER — Ambulatory Visit (INDEPENDENT_AMBULATORY_CARE_PROVIDER_SITE_OTHER): Payer: Medicare Other | Admitting: Pharmacist

## 2016-11-02 DIAGNOSIS — M11262 Other chondrocalcinosis, left knee: Secondary | ICD-10-CM | POA: Diagnosis not present

## 2016-11-02 DIAGNOSIS — Z5181 Encounter for therapeutic drug level monitoring: Secondary | ICD-10-CM

## 2016-11-02 DIAGNOSIS — I2510 Atherosclerotic heart disease of native coronary artery without angina pectoris: Secondary | ICD-10-CM | POA: Diagnosis not present

## 2016-11-02 DIAGNOSIS — I4891 Unspecified atrial fibrillation: Secondary | ICD-10-CM

## 2016-11-02 DIAGNOSIS — M25562 Pain in left knee: Secondary | ICD-10-CM

## 2016-11-02 LAB — POCT INR: INR: 1.2

## 2016-11-02 MED ORDER — IOPAMIDOL (ISOVUE-M 200) INJECTION 41%
40.0000 mL | Freq: Once | INTRAMUSCULAR | Status: AC
  Administered 2016-11-02: 30 mL via INTRA_ARTICULAR

## 2016-11-04 DIAGNOSIS — M25562 Pain in left knee: Secondary | ICD-10-CM | POA: Diagnosis not present

## 2016-11-05 DIAGNOSIS — M25562 Pain in left knee: Secondary | ICD-10-CM | POA: Diagnosis not present

## 2016-11-06 DIAGNOSIS — M25562 Pain in left knee: Secondary | ICD-10-CM | POA: Diagnosis not present

## 2016-11-10 ENCOUNTER — Telehealth: Payer: Self-pay | Admitting: Pharmacist

## 2016-11-10 NOTE — Telephone Encounter (Signed)
Received anticoag clearance request from [facility] that pt is scheduled for a left knee scope menisectomy on 12/16/16 with request to hold Coumadin prior to procedure. Pt takes warfarin for afib with CHADS2 score of 2 (age, HF) and CHADS2VASc of 80 (age x2, CHF, CAD). Ok to hold warfarin for 5 days prior to procedure. Clearance faxed to [phone].

## 2016-11-11 ENCOUNTER — Other Ambulatory Visit: Payer: Self-pay | Admitting: Family Medicine

## 2016-11-11 MED ORDER — FLUTICASONE PROPIONATE 50 MCG/ACT NA SUSP: 2.0000 | Freq: Every day | NASAL | 3 refills | Status: DC | PRN

## 2016-11-11 NOTE — Telephone Encounter (Signed)
Pt request refill  fluticasone (FLONASE) 50 MCG/ACT nasal spray  90 day  [place], [name]

## 2016-11-27 ENCOUNTER — Ambulatory Visit (INDEPENDENT_AMBULATORY_CARE_PROVIDER_SITE_OTHER): Payer: Medicare Other | Admitting: *Deleted

## 2016-11-27 DIAGNOSIS — Z5181 Encounter for therapeutic drug level monitoring: Secondary | ICD-10-CM | POA: Diagnosis not present

## 2016-11-27 DIAGNOSIS — I4891 Unspecified atrial fibrillation: Secondary | ICD-10-CM

## 2016-11-27 LAB — POCT INR: INR: 1.5

## 2016-12-02 ENCOUNTER — Telehealth: Payer: Self-pay | Admitting: Cardiology

## 2016-12-02 ENCOUNTER — Encounter: Payer: Medicare Other | Admitting: *Deleted

## 2016-12-02 NOTE — Telephone Encounter (Signed)
LMOVM reminding pt to send remote transmission.   

## 2016-12-04 ENCOUNTER — Encounter: Payer: Self-pay | Admitting: Cardiology

## 2016-12-04 DIAGNOSIS — I4891 Unspecified atrial fibrillation: Secondary | ICD-10-CM | POA: Diagnosis not present

## 2016-12-04 DIAGNOSIS — Z7901 Long term (current) use of anticoagulants: Secondary | ICD-10-CM | POA: Diagnosis not present

## 2016-12-04 DIAGNOSIS — Z5181 Encounter for therapeutic drug level monitoring: Secondary | ICD-10-CM | POA: Diagnosis not present

## 2016-12-04 LAB — PROTIME-INR: INR: 1.9 — AB (ref 0.9–1.1)

## 2016-12-07 ENCOUNTER — Ambulatory Visit: Payer: Medicare Other | Admitting: Internal Medicine

## 2016-12-07 ENCOUNTER — Ambulatory Visit (INDEPENDENT_AMBULATORY_CARE_PROVIDER_SITE_OTHER): Payer: Medicare Other | Admitting: Cardiovascular Disease

## 2016-12-07 DIAGNOSIS — Z5181 Encounter for therapeutic drug level monitoring: Secondary | ICD-10-CM | POA: Diagnosis not present

## 2016-12-07 DIAGNOSIS — I4891 Unspecified atrial fibrillation: Secondary | ICD-10-CM | POA: Diagnosis not present

## 2016-12-07 DIAGNOSIS — I480 Paroxysmal atrial fibrillation: Secondary | ICD-10-CM | POA: Diagnosis not present

## 2016-12-09 ENCOUNTER — Ambulatory Visit: Payer: Medicare Other | Admitting: Internal Medicine

## 2016-12-14 ENCOUNTER — Telehealth (HOSPITAL_COMMUNITY): Payer: Self-pay | Admitting: *Deleted

## 2016-12-14 ENCOUNTER — Ambulatory Visit: Payer: Self-pay | Admitting: Physician Assistant

## 2016-12-14 NOTE — H&P (Signed)
[name] is an 83 y.o. male.   Chief Complaint: left knee pain HPI: Patient is an 83 year-old male who states he fell about two months ago onto the left knee.  The pain was improving and then standing in the kitchen he pivoted with the knee and he felt sharp, stabbing pain.  Difficult with weight bearing now.  He is using his wife's walker.  We elected to inject the knee with little to no relief we obtained CT scan due to fact he has a pacemaker showing lateral meniscus tear.      Past Medical History:  Diagnosis Date  . Arthritis    "back" (03/20/2014)  . Atrial fibrillation ([place])   . BPH (benign prostatic hypertrophy)   . Breast mass    "on both sides" (06/28/2012)  . Cardiomyopathy primary-nonischemic EF 45%  . CHF (congestive heart failure) ([place])   . Coronary artery disease   . Diverticulosis of colon without hemorrhage 01/03/2014  . Dysphagia   . Elevated liver enzymes   . Esophageal stricture   . GERD (gastroesophageal reflux disease)   . Hearing loss, mixed, bilateral   . Hx of cardiovascular stress test    Adenosine Myoview (07/2013):  Apical cap, apical lateral and mid anterolateral scar, small amount of peri-infarct ischemia, EF 36%; Medium Risk  . Hyperlipidemia   . Increased prostate specific antigen (PSA) velocity   . Internal hemorrhoids 01/03/2014  . Lumbar back pain   . Pacemaker    st jude  . Pacemaker infection ([place])    06/28/12  . Polymyalgia rheumatica ([place])   . Sick sinus syndrome [facility])     Past Surgical History:  Procedure Laterality Date  . AV NODE ABLATION N/A 04/20/2016   Procedure: AV Node Ablation;  Surgeon: Evans Lance, MD;  Location: [facility] LAB;  Service: Cardiovascular;  Laterality: N/A;  . BREAST SURGERY    . CARDIAC CATHETERIZATION  1980's   [place]  . CARDIOVERSION  06/17/2011   Procedure: CARDIOVERSION;  Surgeon: Lelon Perla, MD;  Location: [place];  Service: Cardiovascular;  Laterality: N/A;  . CARDIOVERSION  09/09/2011   Procedure: CARDIOVERSION;  Surgeon: Carlena Bjornstad, MD;  Location: [place];  Service: Cardiovascular;  Laterality: N/A;  . CARDIOVERSION  01/01/2012   Procedure: CARDIOVERSION;  Surgeon: Hillary Bow, MD;  Location: [facility] ENDOSCOPY;  Service: Cardiovascular;  Laterality: N/A;  . CARDIOVERSION N/A 08/03/2014   Procedure: CARDIOVERSION;  Surgeon: Larey Dresser, MD;  Location: [facility] ENDOSCOPY;  Service: Cardiovascular;  Laterality: N/A;  . CARDIOVERSION N/A 11/07/2015   Procedure: CARDIOVERSION;  Surgeon: Larey Dresser, MD;  Location: [facility] ENDOSCOPY;  Service: Cardiovascular;  Laterality: N/A;  . CARDIOVERSION N/A 11/20/2015   Procedure: CARDIOVERSION;  Surgeon: Larey Dresser, MD;  Location: [place];  Service: Cardiovascular;  Laterality: N/A;  . CATARACT EXTRACTION, BILATERAL Bilateral 1980's  . COLONOSCOPY N/A 01/03/2014   Procedure: COLONOSCOPY;  Surgeon: Gatha Mayer, MD;  Location: [facility] ENDOSCOPY;  Service: Endoscopy;  Laterality: N/A;  . ESOPHAGOGASTRODUODENOSCOPY    . GENERATOR REMOVAL Left 06/15/2012   Procedure: GENERATOR REMOVAL;  Surgeon: Evans Lance, MD;  Location: [place];  Service: Cardiovascular;  Laterality: Left;  . HEMORRHOID BANDING    . INGUINAL HERNIA REPAIR Right 2012; 03/20/2014  . INGUINAL HERNIA REPAIR Right 03/20/2014   Procedure: OPEN REPAIR OF RIGHT INGUINAL WITH MESH;  Surgeon: Donnie Mesa, MD;  Location: [place];  Service: General;  Laterality: Right;  . INSERT /  REPLACE / REMOVE PACEMAKER    . INSERTION OF MESH Right 03/20/2014   Procedure: INSERTION OF MESH;  Surgeon: Donnie Mesa, MD;  Location: [place];  Service: General;  Laterality: Right;  . LEAD REVISION N/A 06/29/2012   Procedure: LEAD REVISION;  Surgeon: Deboraha Sprang, MD;  Location: [facility] CATH LAB;  Service: Cardiovascular;  Laterality: N/A;  . PACEMAKER GENERATOR CHANGE N/A 06/15/2012   Procedure: PACEMAKER GENERATOR CHANGE;  Surgeon: Evans Lance, MD;  Location: [place];  Service: Cardiovascular;  Laterality:  N/A;  . PACEMAKER REVISION  06/30/2012   Procedure: PACEMAKER LEAD REVISION;  Surgeon: Evans Lance, MD;  Location: [facility] CATH LAB;  Service: Cardiovascular;;  . PARACENTESIS  ~ 08/4401   complication from pacer change in 2014  . PERMANENT PACEMAKER INSERTION N/A 06/28/2012   Procedure: PERMANENT PACEMAKER INSERTION;  Surgeon: Evans Lance, MD;  Location: [facility] CATH LAB;  Service: Cardiovascular;  Laterality: N/A;  . TEE WITHOUT CARDIOVERSION  01/01/2012   Procedure: TRANSESOPHAGEAL ECHOCARDIOGRAM (TEE);  Surgeon: Fay Records, MD;  Location: [facility] ENDOSCOPY;  Service: Cardiovascular;  Laterality: N/A;  . TEE WITHOUT CARDIOVERSION N/A 08/03/2014   Procedure: TRANSESOPHAGEAL ECHOCARDIOGRAM (TEE);  Surgeon: Larey Dresser, MD;  Location: [place];  Service: Cardiovascular;  Laterality: N/A;  . TEE WITHOUT CARDIOVERSION N/A 11/07/2015   Procedure: TRANSESOPHAGEAL ECHOCARDIOGRAM (TEE);  Surgeon: Larey Dresser, MD;  Location: [place];  Service: Cardiovascular;  Laterality: N/A;  . TONSILLECTOMY AND ADENOIDECTOMY  1938  . UMBILICAL HERNIA REPAIR  [phone] X 3    Family History  Problem Relation Age of Onset  . Heart attack Father 3       deceased  . AAA (abdominal aortic aneurysm) Mother        deceased AAA  . Hypertension Mother   . Other Brother        deceased at birth  . Healthy Son   . Healthy Daughter   . Diabetes Cousin    Social History:  reports that he quit smoking about 33 years ago. His smoking use included Cigarettes. He has a 40.00 pack-year smoking history. He has never used smokeless tobacco. He reports that he drinks alcohol. He reports that he does not use drugs.  Allergies:  Allergies  Allergen Reactions  . Antihistamines, Diphenhydramine-Type Other (See Comments)    Causes difficulty in ability to urinate.     (Not in a hospital admission)  No results found. However, due to the size of the patient record, not all encounters were searched. Please check  Results Review for a complete set of results. No results found.  Review of Systems  HENT: Positive for hearing loss.   Musculoskeletal: Positive for falls and joint pain.  Endo/Heme/Allergies: Bruises/bleeds easily.  All other systems reviewed and are negative.   There were no vitals taken for this visit. Physical Exam  Constitutional: He is oriented to person, place, and time. He appears well-developed and well-nourished. No distress.  HENT:  Head: Normocephalic and atraumatic.  Eyes: Conjunctivae and EOM are normal.  Neck: Normal range of motion. Neck supple.  Cardiovascular: Normal rate and intact distal pulses.   Respiratory: Effort normal. No respiratory distress.  GI: Soft. He exhibits no distension. There is no tenderness.  Musculoskeletal:       Left knee: He exhibits swelling. Tenderness found. Lateral joint line tenderness noted.  Neurological: He is alert and oriented to person, place, and time.  Skin: Skin is warm and dry. No rash noted.  No erythema.  Psychiatric: He has a normal mood and affect. His behavior is normal.     Assessment/Plan At this point, he is a candidate for arthroscopic evaluation with lateral meniscectomy and chondroplasty.  He will need to get off his Coumadin. He has a pacemaker. The risks and benefits have been discussed in detail.  Chriss Czar, PA-C 12/14/2016, 1:22 PM

## 2016-12-14 NOTE — Telephone Encounter (Signed)
Received fax from [facility], pt is sch for left knee scope menisectomy at the hospital later this week and they need clearance from Dr Aundra Dubin.  Pt has already been cleared to hold coumadin but did not get clearance for surgery.  Per Dr Aundra Dubin "ok for surgery" note faxed to Kim at [phone]

## 2016-12-15 ENCOUNTER — Encounter (HOSPITAL_COMMUNITY)
Admission: RE | Admit: 2016-12-15 | Discharge: 2016-12-15 | Disposition: A | Discharge status: Home / Self Care | Payer: Medicare Other | Source: Ambulatory Visit | Attending: Orthopedic Surgery | Admitting: Orthopedic Surgery

## 2016-12-15 ENCOUNTER — Encounter: Payer: Medicare Other | Admitting: Internal Medicine

## 2016-12-15 ENCOUNTER — Encounter (HOSPITAL_COMMUNITY): Payer: Self-pay

## 2016-12-15 ENCOUNTER — Inpatient Hospital Stay (HOSPITAL_COMMUNITY): Admission: RE | Admit: 2016-12-15 | Payer: Medicare Other | Source: Ambulatory Visit

## 2016-12-15 DIAGNOSIS — S83232A Complex tear of medial meniscus, current injury, left knee, initial encounter: Secondary | ICD-10-CM | POA: Diagnosis not present

## 2016-12-15 DIAGNOSIS — W19XXXA Unspecified fall, initial encounter: Secondary | ICD-10-CM | POA: Diagnosis not present

## 2016-12-15 DIAGNOSIS — M94262 Chondromalacia, left knee: Secondary | ICD-10-CM | POA: Diagnosis not present

## 2016-12-15 DIAGNOSIS — I428 Other cardiomyopathies: Secondary | ICD-10-CM | POA: Diagnosis not present

## 2016-12-15 DIAGNOSIS — M353 Polymyalgia rheumatica: Secondary | ICD-10-CM | POA: Diagnosis not present

## 2016-12-15 DIAGNOSIS — N400 Enlarged prostate without lower urinary tract symptoms: Secondary | ICD-10-CM | POA: Diagnosis not present

## 2016-12-15 DIAGNOSIS — Z79899 Other long term (current) drug therapy: Secondary | ICD-10-CM | POA: Diagnosis not present

## 2016-12-15 DIAGNOSIS — Z7901 Long term (current) use of anticoagulants: Secondary | ICD-10-CM | POA: Diagnosis not present

## 2016-12-15 DIAGNOSIS — M11862 Other specified crystal arthropathies, left knee: Secondary | ICD-10-CM | POA: Diagnosis not present

## 2016-12-15 DIAGNOSIS — Z87891 Personal history of nicotine dependence: Secondary | ICD-10-CM | POA: Diagnosis not present

## 2016-12-15 DIAGNOSIS — I509 Heart failure, unspecified: Secondary | ICD-10-CM | POA: Diagnosis not present

## 2016-12-15 DIAGNOSIS — H906 Mixed conductive and sensorineural hearing loss, bilateral: Secondary | ICD-10-CM | POA: Diagnosis not present

## 2016-12-15 DIAGNOSIS — K219 Gastro-esophageal reflux disease without esophagitis: Secondary | ICD-10-CM | POA: Diagnosis not present

## 2016-12-15 DIAGNOSIS — S83282A Other tear of lateral meniscus, current injury, left knee, initial encounter: Secondary | ICD-10-CM | POA: Diagnosis not present

## 2016-12-15 DIAGNOSIS — I480 Paroxysmal atrial fibrillation: Secondary | ICD-10-CM | POA: Diagnosis not present

## 2016-12-15 DIAGNOSIS — Z950 Presence of cardiac pacemaker: Secondary | ICD-10-CM | POA: Diagnosis not present

## 2016-12-15 DIAGNOSIS — Z888 Allergy status to other drugs, medicaments and biological substances status: Secondary | ICD-10-CM | POA: Diagnosis not present

## 2016-12-15 DIAGNOSIS — I2510 Atherosclerotic heart disease of native coronary artery without angina pectoris: Secondary | ICD-10-CM | POA: Diagnosis not present

## 2016-12-15 DIAGNOSIS — E785 Hyperlipidemia, unspecified: Secondary | ICD-10-CM | POA: Diagnosis not present

## 2016-12-15 LAB — CBC
HEMATOCRIT: 41.1 % (ref 39.0–52.0)
Hemoglobin: 14.1 g/dL (ref 13.0–17.0)
MCH: 30.7 pg (ref 26.0–34.0)
MCHC: 34.3 g/dL (ref 30.0–36.0)
MCV: 89.3 fL (ref 78.0–100.0)
PLATELETS: 227 10*3/uL (ref 150–400)
RBC: 4.6 MIL/uL (ref 4.22–5.81)
RDW: 13.6 % (ref 11.5–15.5)
WBC: 8.2 10*3/uL (ref 4.0–10.5)

## 2016-12-15 LAB — PROTIME-INR
INR: 1.24
Prothrombin Time: 15.5 seconds — ABNORMAL HIGH (ref 11.4–15.2)

## 2016-12-15 LAB — BASIC METABOLIC PANEL
ANION GAP: 8 (ref 5–15)
BUN: 19 mg/dL (ref 6–20)
CALCIUM: 9.4 mg/dL (ref 8.9–10.3)
CO2: 26 mmol/L (ref 22–32)
CREATININE: 0.8 mg/dL (ref 0.61–1.24)
Chloride: 103 mmol/L (ref 101–111)
GFR calc Af Amer: >60 mL/min (ref >60)
GLUCOSE: 77 mg/dL (ref 65–99)
Potassium: 4.2 mmol/L (ref 3.5–5.1)
Sodium: 137 mmol/L (ref 135–145)

## 2016-12-15 MED ORDER — CEFAZOLIN SODIUM-DEXTROSE 2-4 GM/100ML-% IV SOLN
2.0000 g | INTRAVENOUS | Status: AC
  Administered 2016-12-16: 2 g via INTRAVENOUS
  Filled 2016-12-15: qty 100

## 2016-12-15 MED ORDER — SODIUM CHLORIDE 0.9 % IV SOLN: INTRAVENOUS | Status: DC

## 2016-12-15 NOTE — Progress Notes (Addendum)
PCP - Dr. Yong Channel Cardiologist - Dr. Aundra Dubin - per note in [place] Dr. Aundra Dubin has given cardiac clearance for this surgery. EP - Dr. Lovena Le - CRT-P device orders requested from Dr. Lovena Le 12/15/2016  Chest x-ray - 02/23/2016 EKG - 06/03/2016 Stress Test - 07/2013 ECHO - 07/2014; TEE - 10/2015 Cardiac Cath - 1980's by Dr. Lia Foyer EP Study & AVN Ablation - 04/2016  Sleep Study - patient denies   Patient denies shortness of breath, fever, cough and chest pain at PAT appointment   Patient verbalized understanding of instructions that were given to them at the PAT appointment. Patient was also instructed that they will need to review over the PAT instructions again at home before surgery.  Patient's last dose of coumadin was Thursday 12/10/2016  Anesthesia review for cardiac hx.

## 2016-12-15 NOTE — Progress Notes (Signed)
Anesthesia PAT Evaluation: Patient is a 83 year old male scheduled for left knee arthroscopy on 12/16/16 by Dr. Earlie [name]. Anesthesia type is posted for [facility].  Patient had requested to talk with anesthesia due to his surgery being moved to [place] due to "low EF." In talking with him, he reports his surgery was initially scheduled at [facility] three weeks ago with planned surgery date of 12/16/16. He did not learn until 12/14/16 that his surgery was being moved. He was feels he is doing well from a cardiac standpoint and is concerned that he may require an automatic overnight stay since being done at [facility]. We discussed some typical guidelines and rationale used to assess for patient appropriateness for surgical center procedures. We also discussed that out-patient procedures are also done at [facility], but overnight stays are sometimes needed in patient's not deemed quite ready for discharge.    History includes PAF (s/p multiple DCCVs '06, '07, '08 '10, '12, '13, '16, 11/07/15 and s/p successful creation of CHB with radiofrequency ablation 04/20/16), CAD, non-ischemic cardiomyopathy, chronic systolic CHF, SA node dysfunction s/p Pacesetter Trilogy 09/13/95 (generator change 04/14/05) with PPM explantation and temporary transvenous pacemaker 06/15/12 (due to pocket infection) s/p explantation of transvenous pacemaker and insertion of Biventricular St. Jude CRT-P pacemaker 8/41/32 complicated by atrial lead perforation with right hemothorax (in the setting of warfarin use) s/p chest tube (CT surgeon Dr. Servando [name]), HLD, GERD, polymyalgia, rheumatica, hearing loss, BPH, transaminitis (due to amiodarone), right inguinal hernia repair 12/29/10 and 03/20/14.   - PCP is Dr. Garret [name]. - Cardiologist is Dr. Loralie [name]. Last visit 08/13/16. He felt patient was okay to proceed with surgery and given permission to hold warfarin 5 days prior to surgery.  - EP cardiologist is Dr. Cristopher [name]. - He saw  pulmonologist Dr. Brand [name] in 08/2013 for possible amiodarone lung toxicity, but felt this was doubtful.   Meds include Flonase, Lasix, losartan, Toprol XL, KCl, pravastatin, Aldactone, Flomax, warfarin (last dose 12/10/16).  BP (!) 100/55   Pulse 93   Temp 36.4 C   Resp 20   Ht 5\' 6"  (1.676 m)   Wt 152 lb 9.6 oz (69.2 kg)   SpO2 100%   BMI 24.63 kg/m  Patient is a Caucasian male in NAD. He was able to walk down the hall with me at a steady pace without significant SOB. No conversational dyspnea. His PPM is located on left chest wall. He denied any new CV symptoms. He denied LE edema. No known anesthesia complications. Heart rhythm irregular. No murmur noted. Lungs clear.   EKG 06/03/16: Electronic ventricular pacemaker.   TEE 11/07/15: Study Conclusions - Left ventricle: Normal LV size and wall thickness. EF 25-30%,   diffuse hypokinesis. - Aortic valve: There was no stenosis. - Aorta: Normal caliber aorta with minimal plaque. - Mitral valve: Moderate central mitral regurgitation. - Left atrium: The atrium was moderately dilated. No evidence of   thrombus in the atrial cavity or appendage. - Right ventricle: The cavity size was mildly dilated. Pacer wire   or catheter noted in right ventricle. Systolic function was   mildly reduced. - Right atrium: The atrium was moderately dilated. - Atrial septum: There appeared to be a small PFO. - Tricuspid valve: There was moderate regurgitation. Peak RV-RA   gradient 22 mmHg.  Nuclear stress test 07/19/13 (read by Dr. Dola [name]): There is a small area of scar with mild peri-infarct ischemia affecting the apical cap, apical lateral segment,  and the mid anterolateral segment. This is a small area. There is only a small amount of ischemia. The ejection fraction is computed at 36%. I think it might be higher. This is a medium risk scan because there is left ventricular dysfunction along with the other findings. However, this does not  appear to represent a significant ischemia risk for the patient at this time. LV Ejection Fraction: 36%. LV Wall Motion: There is decreased motion of the septum. The patient is paced. There is global hypokinesis. It is possible that overall ejection fraction is higher than the value estimated.  According to 04/11/03 cardiology notes, "  "Nonischemic cardiomyopathy.     a. Noncritical coronary artery disease with 70% obtuse marginal on        coronary angiogram in 2001.     b. EF of 41%."  CXR 02/23/16: IMPRESSION: 1. Stable mild cardiomegaly. 2. Stable bibasilar atelectasis and costophrenic angle blunting.  PFTs 06/27/13: FVC 2.98 (90%), FEV1 2.06 (90%), DLCO 13.59 (50%). Minimal obstructive airways disease. Insignificant response to bronchodilator. Normal lung volumes. Moderately severe diffusion defect. Conclusion: Although there is airway obstruction and a diffusion defect suggesting emphysema, the absence of overinflation is inconsistent with that diagnosis. In view of the severity of the diffusion defect, studies with exercise would be helpful to evaluation the presence of hypoxemia.    Preoperative labs noted. PT 15.5, INR 1.24. Glucose 77. CBC and BMET WNL. (Last warfarin 12/10/16.)  If no acute changes then I anticipate that he can proceed as planned. PPM perioperative device form is still pending from [facility]. He had inquired about anesthesia choices. Discussed consideration of general versus spinal anesthesia. His anesthesiologist to discuss definitive anesthesia plan with patient on the day of surgery.  [name] [phone] 12/15/2016 11:46 AM

## 2016-12-15 NOTE — Pre-Procedure Instructions (Signed)
[name]  12/15/2016      [place] [address] [place] [address] [place] [address] Phone: [phone] Fax: [facility] [place], [place] [employer] [place] [address] Phone: [phone] Fax: [phone]    Your procedure is scheduled on 12/16/2016.  Report to [facility] Admitting at 1:00 P.M.  Call this number if you have problems the morning of surgery:  [phone]   Remember:  Do not eat food or drink liquids after midnight.  Continue all other medications as directed by your physician except follow these medication instructions before surgery   Take these medicines the morning of surgery with A SIP OF WATER: Acetaminophen (Tylenol) - if needed Fluticasone (Flonase) - if needed Tamsulosin (Flomax)  7 days prior to surgery STOP taking any Aspirin (unless otherwise instructed by your surgeon), Aleve, Naproxen, Ibuprofen, Motrin, Advil, Goody's, BC's, all herbal medications, fish oil, and all vitamins  Follow your doctors instructions regarding your Warfarin (Coumadin).  If no instructions were given by the doctor you will need to call the office to get instructions.      Do not wear jewelry.  Do not wear lotions, powders, or colognes, or deodorant.  Men may shave face and neck.  Do not bring valuables to the hospital.  [facility] is not responsible for any belongings or valuables.  Contacts, eyeglasses, dentures or bridgework may not be worn into surgery.  Leave your suitcase in the car.  After surgery it may be brought to your room.  For patients admitted to the hospital, discharge time will be determined by your treatment team.  Patients discharged the day of surgery will not be allowed to drive home.   Name and phone number of your driver:    Special instructions:   [place]- Preparing For Surgery  Before surgery,  you can play an important role. Because skin is not sterile, your skin needs to be as free of germs as possible. You can reduce the number of germs on your skin by washing with CHG (chlorahexidine gluconate) Soap before surgery.  CHG is an antiseptic cleaner which kills germs and bonds with the skin to continue killing germs even after washing.  Please do not use if you have an allergy to CHG or antibacterial soaps. If your skin becomes reddened/irritated stop using the CHG.  Do not shave (including legs and underarms) for at least 48 hours prior to first CHG shower. It is OK to shave your face.  Please follow these instructions carefully.   1. Shower the NIGHT BEFORE SURGERY and the MORNING OF SURGERY with CHG.   2. If you chose to wash your hair, wash your hair first as usual with your normal shampoo.  3. After you shampoo, rinse your hair and body thoroughly to remove the shampoo.  4. Use CHG as you would any other liquid soap. You can apply CHG directly to the skin and wash gently with a scrungie or a clean washcloth.   5. Apply the CHG Soap to your body ONLY FROM THE NECK DOWN.  Do not use on open wounds or open sores. Avoid contact with your eyes, ears, mouth and genitals (private parts). Wash genitals (private parts) with your normal soap.  USE REGULAR SHAMPOO AND CONDITIONER FOR HAIR USE REGULAR SOAP FOR FACE AND PRIVATE AREA  6. Wash thoroughly, paying special attention to the area  where your surgery will be performed.  7. Thoroughly rinse your body with warm water from the neck down.  8. DO NOT shower/wash with your normal soap after using and rinsing off the CHG Soap.  9. Pat yourself dry with a CLEAN TOWEL and [place] CLOTH  10. Wear CLEAN PAJAMAS to bed the night before surgery, wear comfortable clothes the morning of surgery  11. Place CLEAN SHEETS on your bed the night of your first shower and DO NOT SLEEP WITH PETS.    Day of Surgery: Do not apply any  deodorants/lotions. Please wear clean clothes to the hospital/surgery center.      Please read over the following fact sheets that you were given. Pain Booklet, Coughing and Deep Breathing and Surgical Site Infection Prevention

## 2016-12-16 ENCOUNTER — Encounter (HOSPITAL_COMMUNITY)
Admission: RE | Disposition: A | Discharge status: Home / Self Care | Payer: Self-pay | Source: Ambulatory Visit | Attending: Orthopedic Surgery

## 2016-12-16 ENCOUNTER — Ambulatory Visit (HOSPITAL_COMMUNITY)
Admission: RE | Admit: 2016-12-16 | Discharge: 2016-12-16 | Disposition: A | Discharge status: Home / Self Care | Payer: Medicare Other | Source: Ambulatory Visit | Attending: Orthopedic Surgery | Admitting: Orthopedic Surgery

## 2016-12-16 ENCOUNTER — Ambulatory Visit (HOSPITAL_COMMUNITY): Payer: Medicare Other | Admitting: Emergency Medicine

## 2016-12-16 ENCOUNTER — Encounter (HOSPITAL_COMMUNITY): Payer: Self-pay | Admitting: Urology

## 2016-12-16 DIAGNOSIS — I480 Paroxysmal atrial fibrillation: Secondary | ICD-10-CM | POA: Diagnosis not present

## 2016-12-16 DIAGNOSIS — H906 Mixed conductive and sensorineural hearing loss, bilateral: Secondary | ICD-10-CM | POA: Diagnosis not present

## 2016-12-16 DIAGNOSIS — N400 Enlarged prostate without lower urinary tract symptoms: Secondary | ICD-10-CM | POA: Diagnosis not present

## 2016-12-16 DIAGNOSIS — M119 Crystal arthropathy, unspecified: Secondary | ICD-10-CM | POA: Diagnosis not present

## 2016-12-16 DIAGNOSIS — I509 Heart failure, unspecified: Secondary | ICD-10-CM | POA: Insufficient documentation

## 2016-12-16 DIAGNOSIS — Z888 Allergy status to other drugs, medicaments and biological substances status: Secondary | ICD-10-CM | POA: Insufficient documentation

## 2016-12-16 DIAGNOSIS — I2510 Atherosclerotic heart disease of native coronary artery without angina pectoris: Secondary | ICD-10-CM | POA: Insufficient documentation

## 2016-12-16 DIAGNOSIS — S83232A Complex tear of medial meniscus, current injury, left knee, initial encounter: Secondary | ICD-10-CM | POA: Insufficient documentation

## 2016-12-16 DIAGNOSIS — S83242A Other tear of medial meniscus, current injury, left knee, initial encounter: Secondary | ICD-10-CM | POA: Diagnosis not present

## 2016-12-16 DIAGNOSIS — E785 Hyperlipidemia, unspecified: Secondary | ICD-10-CM | POA: Diagnosis not present

## 2016-12-16 DIAGNOSIS — M353 Polymyalgia rheumatica: Secondary | ICD-10-CM | POA: Insufficient documentation

## 2016-12-16 DIAGNOSIS — Z950 Presence of cardiac pacemaker: Secondary | ICD-10-CM | POA: Insufficient documentation

## 2016-12-16 DIAGNOSIS — K219 Gastro-esophageal reflux disease without esophagitis: Secondary | ICD-10-CM | POA: Diagnosis not present

## 2016-12-16 DIAGNOSIS — M11862 Other specified crystal arthropathies, left knee: Secondary | ICD-10-CM | POA: Diagnosis not present

## 2016-12-16 DIAGNOSIS — Z7901 Long term (current) use of anticoagulants: Secondary | ICD-10-CM | POA: Insufficient documentation

## 2016-12-16 DIAGNOSIS — S83282A Other tear of lateral meniscus, current injury, left knee, initial encounter: Secondary | ICD-10-CM | POA: Diagnosis not present

## 2016-12-16 DIAGNOSIS — Z79899 Other long term (current) drug therapy: Secondary | ICD-10-CM | POA: Insufficient documentation

## 2016-12-16 DIAGNOSIS — M94262 Chondromalacia, left knee: Secondary | ICD-10-CM | POA: Diagnosis not present

## 2016-12-16 DIAGNOSIS — Z87891 Personal history of nicotine dependence: Secondary | ICD-10-CM | POA: Insufficient documentation

## 2016-12-16 DIAGNOSIS — W19XXXA Unspecified fall, initial encounter: Secondary | ICD-10-CM | POA: Insufficient documentation

## 2016-12-16 DIAGNOSIS — I428 Other cardiomyopathies: Secondary | ICD-10-CM | POA: Insufficient documentation

## 2016-12-16 DIAGNOSIS — M2242 Chondromalacia patellae, left knee: Secondary | ICD-10-CM | POA: Diagnosis not present

## 2016-12-16 HISTORY — PX: KNEE ARTHROSCOPY WITH LATERAL MENISECTOMY: SHX6193

## 2016-12-16 SURGERY — ARTHROSCOPY, KNEE, WITH LATERAL MENISCECTOMY
Anesthesia: General | Laterality: Left

## 2016-12-16 MED ORDER — ONDANSETRON HCL 4 MG/2ML IJ SOLN
INTRAMUSCULAR | Status: DC | PRN
  Administered 2016-12-16: 4 mg via INTRAVENOUS

## 2016-12-16 MED ORDER — SODIUM CHLORIDE 0.9 % IV SOLN: INTRAVENOUS | Status: DC

## 2016-12-16 MED ORDER — 0.9 % SODIUM CHLORIDE (POUR BTL) OPTIME
TOPICAL | Status: DC | PRN
  Administered 2016-12-16: 1000 mL

## 2016-12-16 MED ORDER — PHENYLEPHRINE HCL 10 MG/ML IJ SOLN
INTRAMUSCULAR | Status: DC | PRN
  Administered 2016-12-16: 25 ug/min via INTRAVENOUS

## 2016-12-16 MED ORDER — LIDOCAINE 2% (20 MG/ML) 5 ML SYRINGE
INTRAMUSCULAR | Status: DC | PRN
  Administered 2016-12-16: 80 mg via INTRAVENOUS

## 2016-12-16 MED ORDER — LIDOCAINE 2% (20 MG/ML) 5 ML SYRINGE
INTRAMUSCULAR | Status: AC
  Filled 2016-12-16: qty 5

## 2016-12-16 MED ORDER — OXYCODONE HCL 5 MG/5ML PO SOLN: 5.0000 mg | Freq: Once | ORAL | Status: DC | PRN

## 2016-12-16 MED ORDER — ONDANSETRON HCL 4 MG PO TABS: 4.0000 mg | ORAL_TABLET | Freq: Four times a day (QID) | ORAL | Status: DC | PRN

## 2016-12-16 MED ORDER — DOCUSATE SODIUM 100 MG PO CAPS: 100.0000 mg | ORAL_CAPSULE | Freq: Two times a day (BID) | ORAL | Status: DC

## 2016-12-16 MED ORDER — EPHEDRINE SULFATE-NACL 50-0.9 MG/10ML-% IV SOSY
PREFILLED_SYRINGE | INTRAVENOUS | Status: DC | PRN
  Administered 2016-12-16 (×3): 5 mg via INTRAVENOUS

## 2016-12-16 MED ORDER — METHYLPREDNISOLONE ACETATE 80 MG/ML IJ SUSP
INTRAMUSCULAR | Status: AC
  Filled 2016-12-16: qty 1

## 2016-12-16 MED ORDER — PROPOFOL 10 MG/ML IV BOLUS
INTRAVENOUS | Status: DC | PRN
  Administered 2016-12-16: 30 mg via INTRAVENOUS
  Administered 2016-12-16: 80 mg via INTRAVENOUS
  Administered 2016-12-16: 20 mg via INTRAVENOUS
  Administered 2016-12-16: 50 mg via INTRAVENOUS
  Administered 2016-12-16: 20 mg via INTRAVENOUS

## 2016-12-16 MED ORDER — BUPIVACAINE-EPINEPHRINE (PF) 0.5% -1:200000 IJ SOLN
INTRAMUSCULAR | Status: AC
  Filled 2016-12-16: qty 30

## 2016-12-16 MED ORDER — BUPIVACAINE-EPINEPHRINE 0.5% -1:200000 IJ SOLN
INTRAMUSCULAR | Status: DC | PRN
  Administered 2016-12-16: 10 mL

## 2016-12-16 MED ORDER — METOCLOPRAMIDE HCL 5 MG/ML IJ SOLN: 5.0000 mg | Freq: Three times a day (TID) | INTRAMUSCULAR | Status: DC | PRN

## 2016-12-16 MED ORDER — FENTANYL CITRATE (PF) 100 MCG/2ML IJ SOLN
INTRAMUSCULAR | Status: DC | PRN
  Administered 2016-12-16: 25 ug via INTRAVENOUS
  Administered 2016-12-16: 50 ug via INTRAVENOUS
  Administered 2016-12-16: 25 ug via INTRAVENOUS

## 2016-12-16 MED ORDER — LACTATED RINGERS IV SOLN
INTRAVENOUS | Status: DC
  Administered 2016-12-16: 13:00:00 via INTRAVENOUS

## 2016-12-16 MED ORDER — FENTANYL CITRATE (PF) 250 MCG/5ML IJ SOLN
INTRAMUSCULAR | Status: AC
  Filled 2016-12-16: qty 5

## 2016-12-16 MED ORDER — PHENYLEPHRINE 40 MCG/ML (10ML) SYRINGE FOR IV PUSH (FOR BLOOD PRESSURE SUPPORT)
PREFILLED_SYRINGE | INTRAVENOUS | Status: DC | PRN
  Administered 2016-12-16 (×4): 80 ug via INTRAVENOUS

## 2016-12-16 MED ORDER — CHLORHEXIDINE GLUCONATE 4 % EX LIQD: 60.0000 mL | Freq: Once | CUTANEOUS | Status: DC

## 2016-12-16 MED ORDER — METOCLOPRAMIDE HCL 5 MG PO TABS: 5.0000 mg | ORAL_TABLET | Freq: Three times a day (TID) | ORAL | Status: DC | PRN

## 2016-12-16 MED ORDER — OXYCODONE HCL 5 MG PO TABS: 5.0000 mg | ORAL_TABLET | ORAL | Status: DC | PRN

## 2016-12-16 MED ORDER — LACTATED RINGERS IV SOLN: INTRAVENOUS | Status: DC

## 2016-12-16 MED ORDER — PROPOFOL 10 MG/ML IV BOLUS
INTRAVENOUS | Status: AC
  Filled 2016-12-16: qty 20

## 2016-12-16 MED ORDER — OXYCODONE HCL 5 MG PO TABS: 5.0000 mg | ORAL_TABLET | Freq: Once | ORAL | Status: DC | PRN

## 2016-12-16 MED ORDER — HYDROMORPHONE HCL 1 MG/ML IJ SOLN: 0.2500 mg | INTRAMUSCULAR | Status: DC | PRN

## 2016-12-16 MED ORDER — PROMETHAZINE HCL 25 MG/ML IJ SOLN: 6.2500 mg | INTRAMUSCULAR | Status: DC | PRN

## 2016-12-16 MED ORDER — ONDANSETRON HCL 4 MG/2ML IJ SOLN: 4.0000 mg | Freq: Four times a day (QID) | INTRAMUSCULAR | Status: DC | PRN

## 2016-12-16 MED ORDER — SODIUM CHLORIDE 0.9 % IR SOLN
Status: DC | PRN
  Administered 2016-12-16: 6000 mL

## 2016-12-16 MED ORDER — METHYLPREDNISOLONE ACETATE 80 MG/ML IJ SUSP
INTRAMUSCULAR | Status: DC | PRN
  Administered 2016-12-16: 80 mg via INTRA_ARTICULAR

## 2016-12-16 SURGICAL SUPPLY — 47 items
BANDAGE ACE 6X5 VEL STRL LF (GAUZE/BANDAGES/DRESSINGS) IMPLANT
BANDAGE ELASTIC 6 VELCRO ST LF (GAUZE/BANDAGES/DRESSINGS) ×2 IMPLANT
BLADE CLIPPER SURG (BLADE) IMPLANT
BLADE CUDA 4.2 (BLADE) ×1 IMPLANT
BLADE GREAT WHITE 4.2 (BLADE) ×1 IMPLANT
BLADE GREAT WHITE 4.2MM (BLADE) ×1
BLADE SURG 11 STRL SS (BLADE) IMPLANT
BNDG GAUZE ELAST 4 BULKY (GAUZE/BANDAGES/DRESSINGS) ×3 IMPLANT
COVER SURGICAL LIGHT HANDLE (MISCELLANEOUS) ×3 IMPLANT
DRAPE ARTHROSCOPY W/POUCH 114 (DRAPES) ×3 IMPLANT
DRAPE U-SHAPE 47X51 STRL (DRAPES) ×3 IMPLANT
DRSG PAD ABDOMINAL 8X10 ST (GAUZE/BANDAGES/DRESSINGS) IMPLANT
DURAPREP 26ML APPLICATOR (WOUND CARE) ×3 IMPLANT
FILTER STRAW FLUID ASPIR (MISCELLANEOUS) ×3 IMPLANT
GAUZE SPONGE 4X4 12PLY STRL (GAUZE/BANDAGES/DRESSINGS) ×2 IMPLANT
GAUZE XEROFORM 1X8 LF (GAUZE/BANDAGES/DRESSINGS) ×2 IMPLANT
GLOVE BIOGEL PI IND STRL 8 (GLOVE) ×2 IMPLANT
GLOVE BIOGEL PI INDICATOR 8 (GLOVE) ×4
GLOVE ORTHO TXT STRL SZ7.5 (GLOVE) ×6 IMPLANT
GLOVE SURG ORTHO 8.0 STRL STRW (GLOVE) ×6 IMPLANT
GOWN STRL REUS W/ TWL LRG LVL3 (GOWN DISPOSABLE) ×2 IMPLANT
GOWN STRL REUS W/ TWL XL LVL3 (GOWN DISPOSABLE) ×1 IMPLANT
GOWN STRL REUS W/TWL LRG LVL3 (GOWN DISPOSABLE) ×6
GOWN STRL REUS W/TWL XL LVL3 (GOWN DISPOSABLE) ×3
HOLDER KNEE FOAM BLUE (MISCELLANEOUS) ×2 IMPLANT
KIT ROOM TURNOVER OR (KITS) ×3 IMPLANT
MANIFOLD NEPTUNE II (INSTRUMENTS) IMPLANT
NDL 18GX1X1/2 (RX/OR ONLY) (NEEDLE) IMPLANT
NDL HYPO 25GX1X1/2 BEV (NEEDLE) IMPLANT
NDL SPNL 18GX3.5 QUINCKE PK (NEEDLE) IMPLANT
NEEDLE 18GX1X1/2 (RX/OR ONLY) (NEEDLE) IMPLANT
NEEDLE HYPO 25GX1X1/2 BEV (NEEDLE) IMPLANT
NEEDLE SPNL 18GX3.5 QUINCKE PK (NEEDLE) IMPLANT
NS IRRIG 1000ML POUR BTL (IV SOLUTION) IMPLANT
PACK ARTHROSCOPY DSU (CUSTOM PROCEDURE TRAY) ×3 IMPLANT
PAD ARMBOARD 7.5X6 YLW CONV (MISCELLANEOUS) ×6 IMPLANT
SET ARTHROSCOPY TUBING (MISCELLANEOUS) ×3
SET ARTHROSCOPY TUBING LN (MISCELLANEOUS) ×1 IMPLANT
SPONGE LAP 4X18 X RAY DECT (DISPOSABLE) ×3 IMPLANT
SUT ETHILON 3 0 PS 1 (SUTURE) ×3 IMPLANT
SYR 20ML ECCENTRIC (SYRINGE) IMPLANT
SYR CONTROL 10ML LL (SYRINGE) IMPLANT
TOWEL OR 17X24 6PK STRL BLUE (TOWEL DISPOSABLE) ×3 IMPLANT
TOWEL OR 17X26 10 PK STRL BLUE (TOWEL DISPOSABLE) ×3 IMPLANT
TUBE CONNECTING 12'X1/4 (SUCTIONS) ×1
TUBE CONNECTING 12X1/4 (SUCTIONS) ×2 IMPLANT
WATER STERILE IRR 1000ML POUR (IV SOLUTION) ×3 IMPLANT

## 2016-12-16 NOTE — Interval H&P Note (Signed)
History and Physical Interval Note:  12/16/2016 3:38 PM  [name]  has presented today for surgery, with the diagnosis of LEFT LATERAL MENISCAL TEAR  The various methods of treatment have been discussed with the patient and family. After consideration of risks, benefits and other options for treatment, the patient has consented to  Procedure(s): KNEE ARTHROSCOPY WITH LATERAL MENISECTOMY (Left) as a surgical intervention .  The patient's history has been reviewed, patient examined, no change in status, stable for surgery.  I have reviewed the patient's chart and labs.  Questions were answered to the patient's satisfaction.     Antwone Capozzoli JR,W D

## 2016-12-16 NOTE — Transfer of Care (Signed)
Immediate Anesthesia Transfer of Care Note  Patient: [name]  Procedure(s) Performed: KNEE ARTHROSCOPY WITH LATERAL MENISECTOMY and Chondroplasty (Left )  Patient Location: PACU  Anesthesia Type:General  Level of Consciousness: sedated and drowsy  Airway & Oxygen Therapy: Patient Spontanous Breathing and Patient connected to face mask oxygen  Post-op Assessment: Report given to RN and Post -op Vital signs reviewed and stable  Post vital signs: Reviewed and stable  Last Vitals:  Vitals:   12/16/16 1317 12/16/16 1648  BP: 116/64 (P) 108/64  Pulse:  (P) 76  Resp:  (P) 15  Temp:  (!) (P) 36.3 C  SpO2:  (P) 100%    Last Pain:  Vitals:   12/16/16 1648  TempSrc:   PainSc: (P) 0-No pain      Patients Stated Pain Goal: 2 (42/68/34 1962)  Complications: No apparent anesthesia complications

## 2016-12-16 NOTE — H&P (View-Only) (Signed)
[name] is an 83 y.o. male.   Chief Complaint: left knee pain HPI: Patient is an 83 year-old male who states he fell about two months ago onto the left knee.  The pain was improving and then standing in the kitchen he pivoted with the knee and he felt sharp, stabbing pain.  Difficult with weight bearing now.  He is using his wife's walker.  We elected to inject the knee with little to no relief we obtained CT scan due to fact he has a pacemaker showing lateral meniscus tear.      Past Medical History:  Diagnosis Date  . Arthritis    "back" (03/20/2014)  . Atrial fibrillation ([place])   . BPH (benign prostatic hypertrophy)   . Breast mass    "on both sides" (06/28/2012)  . Cardiomyopathy primary-nonischemic EF 45%  . CHF (congestive heart failure) ([place])   . Coronary artery disease   . Diverticulosis of colon without hemorrhage 01/03/2014  . Dysphagia   . Elevated liver enzymes   . Esophageal stricture   . GERD (gastroesophageal reflux disease)   . Hearing loss, mixed, bilateral   . Hx of cardiovascular stress test    Adenosine Myoview (07/2013):  Apical cap, apical lateral and mid anterolateral scar, small amount of peri-infarct ischemia, EF 36%; Medium Risk  . Hyperlipidemia   . Increased prostate specific antigen (PSA) velocity   . Internal hemorrhoids 01/03/2014  . Lumbar back pain   . Pacemaker    st jude  . Pacemaker infection ([place])    06/28/12  . Polymyalgia rheumatica ([place])   . Sick sinus syndrome [facility])     Past Surgical History:  Procedure Laterality Date  . AV NODE ABLATION N/A 04/20/2016   Procedure: AV Node Ablation;  Surgeon: Evans Lance, MD;  Location: [place] CV LAB;  Service: Cardiovascular;  Laterality: N/A;  . BREAST SURGERY    . CARDIAC CATHETERIZATION  1980's   [place]  . CARDIOVERSION  06/17/2011   Procedure: CARDIOVERSION;  Surgeon: Lelon Perla, MD;  Location: [place];  Service: Cardiovascular;  Laterality: N/A;  . CARDIOVERSION  09/09/2011   Procedure: CARDIOVERSION;  Surgeon: Carlena Bjornstad, MD;  Location: [place];  Service: Cardiovascular;  Laterality: N/A;  . CARDIOVERSION  01/01/2012   Procedure: CARDIOVERSION;  Surgeon: Hillary Bow, MD;  Location: [facility] ENDOSCOPY;  Service: Cardiovascular;  Laterality: N/A;  . CARDIOVERSION N/A 08/03/2014   Procedure: CARDIOVERSION;  Surgeon: Larey Dresser, MD;  Location[facility];  Service: Cardiovascular;  Laterality: N/A;  . CARDIOVERSION N/A 11/07/2015   Procedure: CARDIOVERSION;  Surgeon: Larey Dresser, MD;  Location: [facility] ENDOSCOPY;  Service: Cardiovascular;  Laterality: N/A;  . CARDIOVERSION N/A 11/20/2015   Procedure: CARDIOVERSION;  Surgeon: Larey Dresser, MD;  Location: [place];  Service: Cardiovascular;  Laterality: N/A;  . CATARACT EXTRACTION, BILATERAL Bilateral 1980's  . COLONOSCOPY N/A 01/03/2014   Procedure: COLONOSCOPY;  Surgeon: Gatha Mayer, MD;  Location: [facility] ENDOSCOPY;  Service: Endoscopy;  Laterality: N/A;  . ESOPHAGOGASTRODUODENOSCOPY    . GENERATOR REMOVAL Left 06/15/2012   Procedure: GENERATOR REMOVAL;  Surgeon: Evans Lance, MD;  Location: [place];  Service: Cardiovascular;  Laterality: Left;  . HEMORRHOID BANDING    . INGUINAL HERNIA REPAIR Right 2012; 03/20/2014  . INGUINAL HERNIA REPAIR Right 03/20/2014   Procedure: OPEN REPAIR OF RIGHT INGUINAL WITH MESH;  Surgeon: Donnie Mesa, MD;  Location: [place];  Service: General;  Laterality: Right;  . INSERT /  REPLACE / REMOVE PACEMAKER    . INSERTION OF MESH Right 03/20/2014   Procedure: INSERTION OF MESH;  Surgeon: Donnie Mesa, MD;  Location: [place];  Service: General;  Laterality: Right;  . LEAD REVISION N/A 06/29/2012   Procedure: LEAD REVISION;  Surgeon: Deboraha Sprang, MD;  Location: [facility] CATH LAB;  Service: Cardiovascular;  Laterality: N/A;  . PACEMAKER GENERATOR CHANGE N/A 06/15/2012   Procedure: PACEMAKER GENERATOR CHANGE;  Surgeon: Evans Lance, MD;  Location: [place];  Service: Cardiovascular;  Laterality:  N/A;  . PACEMAKER REVISION  06/30/2012   Procedure: PACEMAKER LEAD REVISION;  Surgeon: Evans Lance, MD;  Location: [facility] CATH LAB;  Service: Cardiovascular;;  . PARACENTESIS  ~ 06/100   complication from pacer change in 2014  . PERMANENT PACEMAKER INSERTION N/A 06/28/2012   Procedure: PERMANENT PACEMAKER INSERTION;  Surgeon: Evans Lance, MD;  Location: [facility] CATH LAB;  Service: Cardiovascular;  Laterality: N/A;  . TEE WITHOUT CARDIOVERSION  01/01/2012   Procedure: TRANSESOPHAGEAL ECHOCARDIOGRAM (TEE);  Surgeon: Fay Records, MD;  Location: [facility] ENDOSCOPY;  Service: Cardiovascular;  Laterality: N/A;  . TEE WITHOUT CARDIOVERSION N/A 08/03/2014   Procedure: TRANSESOPHAGEAL ECHOCARDIOGRAM (TEE);  Surgeon: Larey Dresser, MD;  Location: [place];  Service: Cardiovascular;  Laterality: N/A;  . TEE WITHOUT CARDIOVERSION N/A 11/07/2015   Procedure: TRANSESOPHAGEAL ECHOCARDIOGRAM (TEE);  Surgeon: Larey Dresser, MD;  Location: [place];  Service: Cardiovascular;  Laterality: N/A;  . TONSILLECTOMY AND ADENOIDECTOMY  1936  . UMBILICAL HERNIA REPAIR  [phone] X 3    Family History  Problem Relation Age of Onset  . Heart attack Father 93       deceased  . AAA (abdominal aortic aneurysm) Mother        deceased AAA  . Hypertension Mother   . Other Brother        deceased at birth  . Healthy Son   . Healthy Daughter   . Diabetes Cousin    Social History:  reports that he quit smoking about 33 years ago. His smoking use included Cigarettes. He has a 40.00 pack-year smoking history. He has never used smokeless tobacco. He reports that he drinks alcohol. He reports that he does not use drugs.  Allergies:  Allergies  Allergen Reactions  . Antihistamines, Diphenhydramine-Type Other (See Comments)    Causes difficulty in ability to urinate.     (Not in a hospital admission)  No results found. However, due to the size of the patient record, not all encounters were searched. Please check  Results Review for a complete set of results. No results found.  Review of Systems  HENT: Positive for hearing loss.   Musculoskeletal: Positive for falls and joint pain.  Endo/Heme/Allergies: Bruises/bleeds easily.  All other systems reviewed and are negative.   There were no vitals taken for this visit. Physical Exam  Constitutional: He is oriented to person, place, and time. He appears well-developed and well-nourished. No distress.  HENT:  Head: Normocephalic and atraumatic.  Eyes: Conjunctivae and EOM are normal.  Neck: Normal range of motion. Neck supple.  Cardiovascular: Normal rate and intact distal pulses.   Respiratory: Effort normal. No respiratory distress.  GI: Soft. He exhibits no distension. There is no tenderness.  Musculoskeletal:       Left knee: He exhibits swelling. Tenderness found. Lateral joint line tenderness noted.  Neurological: He is alert and oriented to person, place, and time.  Skin: Skin is warm and dry. No rash noted.  No erythema.  Psychiatric: He has a normal mood and affect. His behavior is normal.     Assessment/Plan At this point, he is a candidate for arthroscopic evaluation with lateral meniscectomy and chondroplasty.  He will need to get off his Coumadin. He has a pacemaker. The risks and benefits have been discussed in detail.  Chriss Czar, PA-C 12/14/2016, 1:22 PM

## 2016-12-16 NOTE — Discharge Instructions (Signed)
Diet: As you were doing prior to hospitalization   Activity: Increase activity slowly as tolerated  No lifting or driving for 48 hours  Shower: May shower without a dressing on post op day #2, NO SOAKING in tub   Dressing: You may change your dressing on post op day #2.  Then change the dressing daily with sterile 4"x4"s gauze dressing or band aids.  Weight Bearing: weight bearing as tolerated.   To prevent constipation: you may use a stool softener such as -  Colace ( over the counter) 100 mg by mouth twice a day  Drink plenty of fluids ( prune juice may be helpful) and high fiber foods  Miralax ( over the counter) for constipation as needed.   Precautions: If you experience chest pain or shortness of breath - call 911 immediately For transfer to the hospital emergency department!!  If you develop a fever greater that 101 F, purulent drainage from wound, increased redness or drainage from wound, or calf pain -- Call the office   Follow- Up Appointment: Please call for an appointment to be seen in 1 week [place] - [phone]

## 2016-12-16 NOTE — Anesthesia Preprocedure Evaluation (Signed)
Anesthesia Evaluation  Patient identified by MRN, date of birth, ID band Patient awake    Reviewed: Allergy & Precautions, NPO status , reviewed documented beta blocker date and time   Airway Mallampati: I  TM Distance: >3 FB     Dental   Pulmonary shortness of breath, former smoker,    Pulmonary exam normal        Cardiovascular + CAD and +CHF  + pacemaker  Rhythm:Irregular Rate:Abnormal     Neuro/Psych    GI/Hepatic GERD  ,  Endo/Other    Renal/GU      Musculoskeletal  (+) Arthritis ,   Abdominal   Peds  Hematology   Anesthesia Other Findings   Reproductive/Obstetrics                             Anesthesia Physical  Anesthesia Plan  ASA: III  Anesthesia Plan: General   Post-op Pain Management:    Induction: Intravenous  PONV Risk Score and Plan: 2 and Ondansetron, Midazolam and Treatment may vary due to age or medical condition  Airway Management Planned: Mask and LMA  Additional Equipment:   Intra-op Plan:   Post-operative Plan:   Informed Consent: I have reviewed the patients History and Physical, chart, labs and discussed the procedure including the risks, benefits and alternatives for the proposed anesthesia with the patient or authorized representative who has indicated his/her understanding and acceptance.   Dental advisory given  Plan Discussed with: CRNA, Anesthesiologist and Surgeon  Anesthesia Plan Comments:         Anesthesia Quick Evaluation

## 2016-12-16 NOTE — Anesthesia Procedure Notes (Signed)
Procedure Name: LMA Insertion Date/Time: 12/16/2016 4:01 PM Performed by: Everlean Cherry A Pre-anesthesia Checklist: Patient identified, Emergency Drugs available, Suction available and Patient being monitored Patient Re-evaluated:Patient Re-evaluated prior to induction Oxygen Delivery Method: Circle system utilized Preoxygenation: Pre-oxygenation with 100% oxygen Induction Type: IV induction LMA: LMA inserted LMA Size: 4.0 Number of attempts: 1 Placement Confirmation: positive ETCO2 and breath sounds checked- equal and bilateral Tube secured with: Tape Dental Injury: Teeth and Oropharynx as per pre-operative assessment

## 2016-12-17 ENCOUNTER — Encounter (HOSPITAL_COMMUNITY): Payer: Self-pay | Admitting: Orthopedic Surgery

## 2016-12-17 ENCOUNTER — Ambulatory Visit (HOSPITAL_COMMUNITY)
Admission: RE | Admit: 2016-12-17 | Discharge: 2016-12-17 | Disposition: A | Discharge status: Home / Self Care | Payer: Medicare Other | Source: Ambulatory Visit | Attending: Cardiovascular Disease | Admitting: Cardiovascular Disease

## 2016-12-17 ENCOUNTER — Other Ambulatory Visit (HOSPITAL_COMMUNITY): Payer: Self-pay | Admitting: Orthopedic Surgery

## 2016-12-17 DIAGNOSIS — M7989 Other specified soft tissue disorders: Secondary | ICD-10-CM

## 2016-12-17 DIAGNOSIS — Z23 Encounter for immunization: Secondary | ICD-10-CM | POA: Diagnosis not present

## 2016-12-17 DIAGNOSIS — M79605 Pain in left leg: Secondary | ICD-10-CM

## 2016-12-17 NOTE — Anesthesia Postprocedure Evaluation (Signed)
Anesthesia Post Note  Patient: [name]  Procedure(s) Performed: KNEE ARTHROSCOPY WITH LATERAL MENISECTOMY and Chondroplasty (Left )     Patient location during evaluation: PACU Anesthesia Type: General Level of consciousness: awake and alert Pain management: pain level controlled Vital Signs Assessment: post-procedure vital signs reviewed and stable Respiratory status: spontaneous breathing, nonlabored ventilation and respiratory function stable Cardiovascular status: blood pressure returned to baseline and stable Postop Assessment: no apparent nausea or vomiting Anesthetic complications: no    Last Vitals:  Vitals:   12/16/16 1718 12/16/16 1726  BP: 114/64 121/68  Pulse: 77 74  Resp: 20 12  Temp:  36.4 C  SpO2: 98% 98%    Last Pain:  Vitals:   12/16/16 1648  TempSrc:   PainSc: 0-No pain                 Lynda Rainwater

## 2016-12-17 NOTE — Op Note (Signed)
NAME:  [name], U[place] NO.:  [id number]  MEDICAL RECORD NO.:  [id number]  LOCATION:                                 FACILITY:  PHYSICIAN:  Lockie Pares, M.D.         DATE OF BIRTH:  DATE OF PROCEDURE:  12/16/2016 DATE OF DISCHARGE:                              OPERATIVE REPORT   PREOPERATIVE DIAGNOSES: 1. Medial and lateral meniscus tears, left knee. 2. Grade 3 chondromalacia patellofemoral joint, medial lateral     compartment. 3. Crystalline arthropathy.  POSTOPERATIVE DIAGNOSES: 1. Medial and lateral meniscus tears, left knee. 2. Grade 3 chondromalacia patellofemoral joint, medial lateral     compartment. 3. Crystalline arthropathy.  OPERATIONS: 1. Partial medial and lateral meniscectomies. 2. Debridement chondroplasty, tricompartmental with synovectomy.  SURGEON:  Lockie Pares, M.D.  ANESTHESIA:  General with local supplementation.  DESCRIPTION OF PROCEDURE:  General anesthetic.  Leg holder.  No tourniquet.  Inferomedial and inferolateral portals created.  Systematic inspection of the knee showed the patient to have mild chondromalacia of patella.  He did have extensive crystalline arthropathy throughout the knee.  We did a synovectomy on the medial side, and although the MRI showed no tear, which showed a complex tear of the medial meniscus required resection about 20% to 30% of the meniscus substance. Chondroplasty was carried out on the medial side of the knee.  Lateral side showed as on the MRI.  Large anterior middle horn tear of the lateral meniscus.  Once we resected into this tear, we probably resected the anterior 2/3rd of the meniscus.  Again, no full-thickness cartilage defects were noted. Chondroplasty was carried out as well as the partial medial and lateral meniscectomies.  Knee drained free of fluid, portals closed, infiltrated the joint with 80 mg Depo-Medrol with approximately 15 to 18 mL of 0.5% Marcaine with additional  Marcaine without Depo-Medrol into the portals.     Lockie Pares, M.D.   ______________________________ Lockie Pares, M.D.    WDC/MEDQ  D:  12/16/2016  T:  12/16/2016  Job:  [id number]

## 2016-12-23 ENCOUNTER — Ambulatory Visit (INDEPENDENT_AMBULATORY_CARE_PROVIDER_SITE_OTHER): Payer: Medicare Other

## 2016-12-23 DIAGNOSIS — Z5181 Encounter for therapeutic drug level monitoring: Secondary | ICD-10-CM

## 2016-12-23 DIAGNOSIS — I4891 Unspecified atrial fibrillation: Secondary | ICD-10-CM | POA: Diagnosis not present

## 2016-12-23 LAB — POCT INR: INR: 1.4

## 2016-12-28 DIAGNOSIS — S83242D Other tear of medial meniscus, current injury, left knee, subsequent encounter: Secondary | ICD-10-CM | POA: Diagnosis not present

## 2017-01-01 ENCOUNTER — Ambulatory Visit (INDEPENDENT_AMBULATORY_CARE_PROVIDER_SITE_OTHER): Payer: Medicare Other | Admitting: *Deleted

## 2017-01-01 DIAGNOSIS — I4891 Unspecified atrial fibrillation: Secondary | ICD-10-CM

## 2017-01-01 DIAGNOSIS — Z5181 Encounter for therapeutic drug level monitoring: Secondary | ICD-10-CM | POA: Diagnosis not present

## 2017-01-01 LAB — POCT INR: INR: 3.5

## 2017-01-05 DIAGNOSIS — M25562 Pain in left knee: Secondary | ICD-10-CM | POA: Diagnosis not present

## 2017-01-07 DIAGNOSIS — M25562 Pain in left knee: Secondary | ICD-10-CM | POA: Diagnosis not present

## 2017-01-12 DIAGNOSIS — M25562 Pain in left knee: Secondary | ICD-10-CM | POA: Diagnosis not present

## 2017-01-14 DIAGNOSIS — M25562 Pain in left knee: Secondary | ICD-10-CM | POA: Diagnosis not present

## 2017-01-15 ENCOUNTER — Ambulatory Visit (INDEPENDENT_AMBULATORY_CARE_PROVIDER_SITE_OTHER): Payer: Medicare Other | Admitting: Pharmacist

## 2017-01-15 DIAGNOSIS — I4891 Unspecified atrial fibrillation: Secondary | ICD-10-CM | POA: Diagnosis not present

## 2017-01-15 DIAGNOSIS — Z5181 Encounter for therapeutic drug level monitoring: Secondary | ICD-10-CM | POA: Diagnosis not present

## 2017-01-15 LAB — POCT INR: INR: 2.8

## 2017-01-18 DIAGNOSIS — M25562 Pain in left knee: Secondary | ICD-10-CM | POA: Diagnosis not present

## 2017-01-19 ENCOUNTER — Encounter: Payer: Self-pay | Admitting: Internal Medicine

## 2017-01-19 ENCOUNTER — Ambulatory Visit (INDEPENDENT_AMBULATORY_CARE_PROVIDER_SITE_OTHER): Payer: Medicare Other | Admitting: Internal Medicine

## 2017-01-19 VITALS — BP 100/68 | HR 66 | Ht 66.0 in | Wt 151.4 lb

## 2017-01-19 DIAGNOSIS — K641 Second degree hemorrhoids: Secondary | ICD-10-CM

## 2017-01-19 DIAGNOSIS — R151 Fecal smearing: Secondary | ICD-10-CM | POA: Diagnosis not present

## 2017-01-19 DIAGNOSIS — S83282D Other tear of lateral meniscus, current injury, left knee, subsequent encounter: Secondary | ICD-10-CM | POA: Diagnosis not present

## 2017-01-19 DIAGNOSIS — R150 Incomplete defecation: Secondary | ICD-10-CM

## 2017-01-19 DIAGNOSIS — I2510 Atherosclerotic heart disease of native coronary artery without angina pectoris: Secondary | ICD-10-CM | POA: Diagnosis not present

## 2017-01-19 DIAGNOSIS — S83242D Other tear of medial meniscus, current injury, left knee, subsequent encounter: Secondary | ICD-10-CM | POA: Diagnosis not present

## 2017-01-19 NOTE — Progress Notes (Signed)
[name] 83 y.o. [birth year] [id number]  Assessment & Plan:   Encounter Diagnoses  Name Primary?  . Fecal smearing Yes  . Incomplete defecation   . Prolapsed internal hemorrhoids, grade 2 - causing fecal seepage     He is better with banding but I think there is a problem with disordered defecation related to his issues and I think pelvic floor physical therapy might make a difference so we will try that as opposed to repeat banding.  He has to stop his warfarin and there is a rare stroke risk each time anyway so let us try this and see if that makes a difference.  I will see him back after the physical therapy if needed.  I appreciate the opportunity to care for this patient. CC: [name], MD    Subjective:   Chief Complaint: Fecal incontinence  HPI The patient presents with complaints of fecal incontinence still.  I have banded all 3 columns of hemorrhoids twice, in 2016 and 2018.  He notes improvement since then but would like to have this go away completely.  He has tried fiber and Imodium without success.  He does not have urinary incontinence.  His stools are soft but formed he says, but he does not feel like he can have complete defecation.  He wipes a lot and then stool will still show up in a pad which is bothersome.  He does not have rectal bleeding.  He takes warfarin for atrial fibrillation.  Colonoscopy in 2015 did not reveal any problems related to this or any neoplasia.  He did have some diverticulosis. Allergies  Allergen Reactions  . Antihistamines, Diphenhydramine-Type Other (See Comments)    Causes difficulty in ability to urinate.   Current Meds  Medication Sig  . acetaminophen (TYLENOL) 500 MG tablet Take 1,000 mg by mouth every 6 (six) hours as needed for moderate pain or headache.  . fluticasone (FLONASE) 50 MCG/ACT nasal spray Place 2 sprays into both nostrils daily as needed for allergies or rhinitis.  . furosemide (LASIX) 80 MG tablet  Take 1 tablet (80 mg total) by mouth daily.  . hydroxypropyl methylcellulose / hypromellose (ISOPTO TEARS / GONIOVISC) 2.5 % ophthalmic solution Place 1 drop into both eyes daily as needed for dry eyes.  Marland Kitchen losartan (COZAAR) 25 MG tablet Take 0.5 tablets (12.5 mg total) by mouth daily.  . metoprolol succinate (TOPROL-XL) 25 MG 24 hr tablet Take 0.5 tablets (12.5 mg total) by mouth at bedtime.  . potassium chloride SA (K-DUR,KLOR-CON) 20 MEQ tablet Take 1 tablet (20 mEq total) by mouth 2 (two) times daily.  . pravastatin (PRAVACHOL) 80 MG tablet Take 0.5 tablets (40 mg total) by mouth daily.  Marland Kitchen spironolactone (ALDACTONE) 25 MG tablet Take 0.5 tablets (12.5 mg total) by mouth every evening.  . tamsulosin (FLOMAX) 0.4 MG CAPS capsule Take 1 capsule (0.4 mg total) by mouth daily.  Marland Kitchen warfarin (COUMADIN) 5 MG tablet TAKE 1/2 TO 1 TABLET (2.5 TO 5 MG TOTAL) DAILY AS DIRECTED BY ANTICOAGULATION CLINIC (Patient taking differently: Take 2.5-5 mg by mouth See admin instructions. Take 2.5 mg on Tues / Sat  Take 5 mg on Mon / Wed / Thurs / Fri / Sun)   Past Medical History:  Diagnosis Date  . Arthritis    "back" (03/20/2014)  . Atrial fibrillation ([place])   . BPH (benign prostatic hypertrophy)   . Breast mass    "on both sides" (06/28/2012)  . Cardiomyopathy primary-nonischemic EF  45%  . CHF (congestive heart failure) ([place])   . Coronary artery disease   . Diverticulosis of colon without hemorrhage 01/03/2014  . Dysphagia   . Elevated liver enzymes   . Esophageal stricture   . GERD (gastroesophageal reflux disease)   . Hearing loss, mixed, bilateral   . Hx of cardiovascular stress test    Adenosine Myoview (07/2013):  Apical cap, apical lateral and mid anterolateral scar, small amount of peri-infarct ischemia, EF 36%; Medium Risk  . Hyperlipidemia   . Increased prostate specific antigen (PSA) velocity   . Internal hemorrhoids 01/03/2014  . Lumbar back pain   . Pacemaker    st jude  . Pacemaker  infection ([place])    06/28/12  . Polymyalgia rheumatica ([place])   . Sick sinus syndrome [facility])    Past Surgical History:  Procedure Laterality Date  . BREAST SURGERY    . CARDIAC CATHETERIZATION  1980's   [place]  . CATARACT EXTRACTION, BILATERAL Bilateral 1980's  . ESOPHAGOGASTRODUODENOSCOPY    . HEMORRHOID BANDING    . INGUINAL HERNIA REPAIR Right 2012; 03/20/2014  . INSERT / REPLACE / REMOVE PACEMAKER    . PARACENTESIS  ~ 0/2637   complication from pacer change in 2014  . TONSILLECTOMY AND ADENOIDECTOMY  1938  . UMBILICAL HERNIA REPAIR  [phone] X 3   Social History   Socioeconomic History  . Marital status: Married    Spouse name: Not on file  . Number of children: 3                                Occupational History  . Occupation: retired    Fish farm manager: RETIRED    Comment: [employer]  Tobacco Use  . Smoking status: Former Smoker    Packs/day: 1.00    Years: 40.00    Pack years: 40.00    Types: Cigarettes    Last attempt to quit: 03/10/1983    Years since quitting: 33.8  . Smokeless tobacco: Never Used  Substance and Sexual Activity  . Alcohol use: No    Alcohol/week: 0.0 oz    Comment: quit drinking 2000-? alcohol related cardiomyopathy  . Drug use: No  . Sexual activity: Not Currently        Social History Narrative   Married. 3 children (6 together). 5 grandkids with with 5 grandkids (2nd marriage). No greatgrandkids.    One story home.   Retired from [employer]   Education: college   Hobbies: play golf, bridge, garden, write family stories   family history includes AAA (abdominal aortic aneurysm) in his mother; Diabetes in his cousin; Healthy in his daughter and son; Heart attack (age of onset: 38) in his father; Hypertension in his mother; Other in his brother.   Review of Systems Currently doing physical therapy for his knee  Objective:   Physical Exam BP 100/68   Pulse 66   Ht 5\' 6"  (1.676 m)   Wt 151 lb  6 oz (68.7 kg)   BMI 24.43 kg/m   Rectal exam reveals a normal anoderm.  Digital exam shows some mild anal stenosis, no mass, he has intact but somewhat reduced squeeze voluntary, and simulated defecation shows inappropriate contraction of the rectum with appropriate abdominal contraction.  Anoscopy exam again demonstrates internal hemorrhoids somewhat inflamed grade 2 all positions

## 2017-01-19 NOTE — Assessment & Plan Note (Signed)
Pelvic PT

## 2017-01-19 NOTE — Assessment & Plan Note (Signed)
Better but still sxs Try pelvic PT

## 2017-01-19 NOTE — Patient Instructions (Addendum)
If you are age 81 or older, your body mass index should be between 23-30. Your Body mass index is 24.43 kg/m. If this is out of the aforementioned range listed, please consider follow up with your Primary Care Provider.  If you are age 7 or younger, your body mass index should be between 19-25. Your Body mass index is 24.43 kg/m. If this is out of the aformentioned range listed, please consider follow up with your Primary Care Provider.   We have referred you to physical therapy. We will be in touch with you regarding an appointment.    Thank you.

## 2017-01-25 DIAGNOSIS — M25562 Pain in left knee: Secondary | ICD-10-CM | POA: Diagnosis not present

## 2017-02-01 ENCOUNTER — Encounter: Payer: Self-pay | Admitting: Physical Therapy

## 2017-02-01 ENCOUNTER — Ambulatory Visit: Payer: Medicare Other | Attending: Internal Medicine | Admitting: Physical Therapy

## 2017-02-01 DIAGNOSIS — R278 Other lack of coordination: Secondary | ICD-10-CM | POA: Insufficient documentation

## 2017-02-01 DIAGNOSIS — M6281 Muscle weakness (generalized): Secondary | ICD-10-CM | POA: Diagnosis not present

## 2017-02-01 NOTE — Therapy (Signed)
[facility] [address], [place], [place], [address] Phone: [phone]   Fax:  [phone]  Physical Therapy Evaluation  Patient Details  Name: AZHAR KNOPE MRN: [id number] Date of Birth: [birth year] Referring Provider: Dr. Silvano Rusk   Encounter Date: 02/01/2017  PT End of Session - 02/01/17 0858    Visit Number  1    Number of Visits  10    Date for PT Re-Evaluation  03/29/17    Authorization Type  Medicare BCBS    PT Start Time  0800    PT Stop Time  0855    PT Time Calculation (min)  55 min    Activity Tolerance  Patient tolerated treatment well    Behavior During Therapy  [facility] for tasks assessed/performed       Past Medical History:  Diagnosis Date  . Arthritis    "back" (03/20/2014)  . Atrial fibrillation ([place])   . BPH (benign prostatic hypertrophy)   . Breast mass    "on both sides" (06/28/2012)  . Cardiomyopathy primary-nonischemic EF 45%  . CHF (congestive heart failure) ([place])   . Coronary artery disease   . Diverticulosis of colon without hemorrhage 01/03/2014  . Dysphagia   . Elevated liver enzymes   . Esophageal stricture   . GERD (gastroesophageal reflux disease)   . Hearing loss, mixed, bilateral   . Hx of cardiovascular stress test    Adenosine Myoview (07/2013):  Apical cap, apical lateral and mid anterolateral scar, small amount of peri-infarct ischemia, EF 36%; Medium Risk  . Hyperlipidemia   . Increased prostate specific antigen (PSA) velocity   . Internal hemorrhoids 01/03/2014  . Lumbar back pain   . Pacemaker    st jude  . Pacemaker infection ([place])    06/28/12  . Polymyalgia rheumatica ([place])   . Sick sinus syndrome [facility])     Past Surgical History:  Procedure Laterality Date  . AV NODE ABLATION N/A 04/20/2016   Procedure: AV Node Ablation;  Surgeon: Evans Lance, MD;  Location: [place] [facility];  Service: Cardiovascular;  Laterality: N/A;  . BREAST SURGERY    .  CARDIAC CATHETERIZATION  1980's   [place]  . CARDIOVERSION  06/17/2011   Procedure: CARDIOVERSION;  Surgeon: Lelon Perla, MD;  Location: [place];  Service: Cardiovascular;  Laterality: N/A;  . CARDIOVERSION  09/09/2011   Procedure: CARDIOVERSION;  Surgeon: Carlena Bjornstad, MD;  Location: [place];  Service: Cardiovascular;  Laterality: N/A;  . CARDIOVERSION  01/01/2012   Procedure: CARDIOVERSION;  Surgeon: Hillary Bow, MD;  Location[facility] ENDOSCOPY;  Service: Cardiovascular;  Laterality: N/A;  . CARDIOVERSION N/A 08/03/2014   Procedure: CARDIOVERSION;  Surgeon: Larey Dresser, MD;  Location: [facility] ENDOSCOPY;  Service: Cardiovascular;  Laterality: N/A;  . CARDIOVERSION N/A 11/07/2015   Procedure: CARDIOVERSION;  Surgeon: Larey Dresser, MD;  Location: [facility] ENDOSCOPY;  Service: Cardiovascular;  Laterality: N/A;  . CARDIOVERSION N/A 11/20/2015   Procedure: CARDIOVERSION;  Surgeon: Larey Dresser, MD;  Location: [place];  Service: Cardiovascular;  Laterality: N/A;  . CATARACT EXTRACTION, BILATERAL Bilateral 1980's  . COLONOSCOPY N/A 01/03/2014   Procedure: COLONOSCOPY;  Surgeon: Gatha Mayer, MD;  Location: [facility] ENDOSCOPY;  Service: Endoscopy;  Laterality: N/A;  . ESOPHAGOGASTRODUODENOSCOPY    . GENERATOR REMOVAL Left 06/15/2012   Procedure: GENERATOR REMOVAL;  Surgeon: Evans Lance, MD;  Location: [place];  Service: Cardiovascular;  Laterality: Left;  . HEMORRHOID BANDING    .  INGUINAL HERNIA REPAIR Right 2012; 03/20/2014  . INGUINAL HERNIA REPAIR Right 03/20/2014   Procedure: OPEN REPAIR OF RIGHT INGUINAL WITH MESH;  Surgeon: Donnie Mesa, MD;  Location: [place];  Service: General;  Laterality: Right;  . INSERT / REPLACE / REMOVE PACEMAKER    . INSERTION OF MESH Right 03/20/2014   Procedure: INSERTION OF MESH;  Surgeon: Donnie Mesa, MD;  Location: [place];  Service: General;  Laterality: Right;  . KNEE ARTHROSCOPY WITH LATERAL MENISECTOMY Left 12/16/2016   Procedure: KNEE ARTHROSCOPY WITH LATERAL  MENISECTOMY and Chondroplasty;  Surgeon: Earlie Server, MD;  Location: [place];  Service: Orthopedics;  Laterality: Left;  . LEAD REVISION N/A 06/29/2012   Procedure: LEAD REVISION;  Surgeon: Deboraha Sprang, MD;  Location: [facility] CATH LAB;  Service: Cardiovascular;  Laterality: N/A;  . PACEMAKER GENERATOR CHANGE N/A 06/15/2012   Procedure: PACEMAKER GENERATOR CHANGE;  Surgeon: Evans Lance, MD;  Location: [place];  Service: Cardiovascular;  Laterality: N/A;  . PACEMAKER REVISION  06/30/2012   Procedure: PACEMAKER LEAD REVISION;  Surgeon: Evans Lance, MD;  Location: [facility] CATH LAB;  Service: Cardiovascular;;  . PARACENTESIS  ~ 04/6710   complication from pacer change in 2014  . PERMANENT PACEMAKER INSERTION N/A 06/28/2012   Procedure: PERMANENT PACEMAKER INSERTION;  Surgeon: Evans Lance, MD;  Location: [facility] CATH LAB;  Service: Cardiovascular;  Laterality: N/A;  . TEE WITHOUT CARDIOVERSION  01/01/2012   Procedure: TRANSESOPHAGEAL ECHOCARDIOGRAM (TEE);  Surgeon: Fay Records, MD;  Location: [facility] ENDOSCOPY;  Service: Cardiovascular;  Laterality: N/A;  . TEE WITHOUT CARDIOVERSION N/A 08/03/2014   Procedure: TRANSESOPHAGEAL ECHOCARDIOGRAM (TEE);  Surgeon: Larey Dresser, MD;  Location: [place];  Service: Cardiovascular;  Laterality: N/A;  . TEE WITHOUT CARDIOVERSION N/A 11/07/2015   Procedure: TRANSESOPHAGEAL ECHOCARDIOGRAM (TEE);  Surgeon: Larey Dresser, MD;  Location: [place];  Service: Cardiovascular;  Laterality: N/A;  . TONSILLECTOMY AND ADENOIDECTOMY  1938  . UMBILICAL HERNIA REPAIR  [phone] X 3    There were no vitals filed for this visit.   Subjective Assessment - 02/01/17 0809    Subjective  Patient reports fecal incontinence since 1 year ago with gradual onset. Patient had banding of his hemmorroids.      Patient Stated Goals  reduce fecal soiling    Currently in Pain?  No/denies    Multiple Pain Sites  No         [facility] PT Assessment - 02/01/17 0001      Assessment   Medical  Diagnosis  R15.1 fecal smearing; R15.0 Inconmplete defecation    Referring Provider  Dr. Silvano Rusk    Onset Date/Surgical Date  02/02/16    Prior Therapy  none for this      Precautions   Precautions  ICD/Pacemaker    Precaution Comments  no biobeedback or ultrasoun/electrical stimulation      Restrictions   Weight Bearing Restrictions  No      Balance Screen   Has the patient fallen in the past 6 months  No    Has the patient had a decrease in activity level because of a fear of falling?   No    Is the patient reluctant to leave their home because of a fear of falling?   No      Home Film/video editor residence      Prior Function   Level of Independence  Independent    Vocation  Retired  Vocation Requirements  lives at [employer]  exercise at the gym       Cognition   Overall Cognitive Status  Within Functional Limits for tasks assessed      Observation/Other Assessments   Focus on Therapeutic Outcomes (FOTO)   therapist discretion limited by 50% due to fecal soiling      Posture/Postural Control   Posture/Postural Control  No significant limitations      ROM / Strength   AROM / PROM / Strength  AROM;PROM;Strength      Strength   Overall Strength Comments  abdominal strength is 2/5 with weakness in midline of abdominals    Right Hip Extension  4/5    Right Hip ABduction  3/5    Left Hip Extension  4/5    Left Hip External Rotation  4/5 due to left knee pain    Left Hip ABduction  3+/5      Transfers   Transfers  Not assessed      Ambulation/Gait   Ambulation/Gait  Yes             Objective measurements completed on examination: See above findings.    Pelvic Floor Special Questions - 02/01/17 0001    Urinary Leakage  Yes    How often  occasionally no steady stream    Pad use  1-4    Fecal incontinence  Yes 3-4 bowel movements per day; used to be 1 per day    Skin Integrity  Intact    Pelvic Floor Internal  Exam  Patient confirms identification and approves PT to assess anal sphincter muscle tone    Exam Type  Rectal    Palpation  decreased external sphincter contraction, no lift of pelvic floor musclse    Strength  weak squeeze, no lift    Strength # of reps  4    Strength # of seconds  4               PT Education - 02/01/17 0856    Education provided  Yes    Education Details  pelvic floor contraction in sidely, abdominal bracing in hookly and abdominal bracing with clam; intro to bowel health    Person(s) Educated  Patient    Methods  Explanation;Verbal cues;Handout;Demonstration    Comprehension  Verbalized understanding;Returned demonstration       PT Short Term Goals - 02/01/17 0907      PT SHORT TERM GOAL #1   Title  independent with initial HEP    Time  4    Period  Weeks    Status  New    Target Date  03/01/17      PT SHORT TERM GOAL #2   Title  able to contract pelvic floor with a lift with lower abdominal contraction due to improved pelvic floor strength and coordinatioin    Time  4    Period  Weeks    Status  New    Target Date  03/01/17      PT SHORT TERM GOAL #3   Title  fecal incontinence decreased >/= 25% due to increased strength    Time  4    Period  Weeks    Status  New    Target Date  03/01/17      PT SHORT TERM GOAL #4   Title  understand what bowel health is by adding more vegetables and fiber in his diet    Time  4  Period  Weeks    Status  New    Target Date  03/01/17        PT Long Term Goals - 02/01/17 0909      PT LONG TERM GOAL #1   Title  independent with HEP and how to progress himselft    Time  8    Period  Weeks    Status  New    Target Date  03/29/17      PT LONG TERM GOAL #2   Title  pelvic floor strength is >/= 4/5 with 10 second hold to reduce fecal incontinence >/= 75%    Time  8    Period  Weeks    Status  New    Target Date  03/29/17      PT LONG TERM GOAL #3   Title  abdominal strength >/= 3/5 with  minimal to no midline abdominal protrusion due to improved quality of contraction    Time  8    Period  Weeks    Status  New    Target Date  03/29/17      PT LONG TERM GOAL #4   Title  use 1 to no pads due to reduction of fecal leakage >/= 75%    Time  8    Period  Weeks    Status  New    Target Date  03/29/17             Plan - 02/01/17 0900    Clinical Impression Statement  Patient is a 83 year old male with fecal incontinence for the past year.  Patient reports he had some hemorrhoids removed and it improved his fecal incontinence some.  Patient reports no pain.  Patient uses 1-4 pads per day.  Patient has 3-4 bowel movements per day.  Patient reports no activity is linked to his bowel leakage.  Pelvic floor strength is 2/5 with no lift and external sphincter has minimal contraction.  Patient can hold contraction for 4 seconds 4 times.  Abdominal strength is 2/5 with weakness in the midline of the abdominals and protrudes the midline.  Bilateral hip abduction strength is 3-3+/4 and extension is 4/5.  Patient will benefit from skilled therapy to improve pelvic floor strength to reduce fecal incontinence.     History and Personal Factors relevant to plan of care:  Pacemaker; CHF; s/p inguinal hernia repair    Clinical Presentation  Stable    Clinical Presentation due to:  stable condition    Clinical Decision Making  Low    Rehab Potential  Excellent    Clinical Impairments Affecting Rehab Potential  Pacemaker; CHF; s/p inguinal hernia repair    PT Frequency  1x / week    PT Duration  8 weeks    PT Treatment/Interventions  Neuromuscular re-education;Therapeutic activities;Therapeutic exercise;Patient/family education;Manual techniques    PT Next Visit Plan  abdominal massage; pelvic floor contraction with elevator in sidely, sitting pelvic floor contraction using the ball and band;     PT Home Exercise Plan  progress as needed    Consulted and Agree with Plan of Care  Patient        Patient will benefit from skilled therapeutic intervention in order to improve the following deficits and impairments:  Decreased coordination, Decreased strength  Visit Diagnosis: Muscle weakness (generalized) - Plan: PT plan of care cert/re-cert  Other lack of coordination - Plan: PT plan of care cert/re-cert  G-Codes - 74/12/87 8676  Functional Assessment Tool Used (Outpatient Only)  therapist discretion limited by 50% due to leakage goal is 30% decreased    Functional Limitation  Other PT primary    Other PT Primary Current Status (R5188)  At least 60 percent but less than 80 percent impaired, limited or restricted    Other PT Primary Goal Status (C1660)  At least 20 percent but less than 40 percent impaired, limited or restricted        Problem List Patient Active Problem List   Diagnosis Date Noted  . Left knee pain 10/23/2016  . Prolapsed internal hemorrhoids, grade 2 - causing fecal seepage 06/10/2016  . Atrial fibrillation with tachycardic ventricular rate ([place]) 04/20/2016  . Chronic anticoagulation 02/10/2016  . AK (actinic keratosis) 01/28/2016  . Left breast lump 07/15/2015  . Lipoma 07/08/2015  . Neuropathy 05/16/2015  . Trigger thumb of right hand 07/04/2014  . Encounter for therapeutic drug monitoring 07/02/2014  . Allergic rhinitis 02/14/2014  . Fecal incontinence 10/18/2013  . Chronic cough 08/18/2013  . Pleural effusion, right 08/16/2013  . Chronic systolic CHF (congestive heart failure) ([place]) 03/21/2013  . Recurrent right inguinal hernia 11/22/2012  . Intention tremor 11/01/2012  . Pleural effusion on right 07/06/2012  . Nonischemic cardiomyopathy ([place]) 09/20/2010  . PPM-St.[place]ude 11/27/2008  . CAD (coronary artery disease) 07/20/2008  . SICK SINUS SYNDROME 07/20/2008  . Polymyalgia rheumatica ([place]) 07/03/2008  . MIXED HEARING LOSS BILATERAL 01/10/2008  . Hyperlipidemia 03/16/2007  . Atrial fibrillation ([place]) 03/16/2007  . GERD 03/16/2007  . BPH  (benign prostatic hyperplasia) 03/16/2007    Earlie Counts, PT 02/01/17 9:14 AM   [facility] [address] [place], [place], [address] Phone: [phone]   Fax:  [phone]  Name: ZELL HYLTON MRN: [id number] Date of Birth: Apr 03, 1929

## 2017-02-01 NOTE — Patient Instructions (Addendum)
Slow Contraction: Gravity Eliminated (Side-Lying)    Lie on left side, hips and knees slightly bent. Slowly squeeze pelvic floor for _4__ seconds. Rest for _10__ seconds. Repeat _10__ times. Do _3__ times a day. Then end with 5 quick contractions  Copyright  VHI. All rights reserved.    Bracing With Knee Fallout (Hook-Lying)    With neutral spine, tighten pelvic floor and abdominals and hold. Alternating legs, drop knee out to side. Keep opposite hip still. Repeat _10_ times. Do _1__ times a day. Must go slow.   Copyright  VHI. All rights reserved.  Isometric Hold (Hook-Lying)    Lie with hips and knees bent. Slowly inhale, and then exhale. Pull navel toward spine and Hold for _5__ seconds. Continue to breathe in and out during hold. Rest for __5_ seconds. Repeat _10__ times. Do _1-2__ times a day.   Copyright  VHI. All rights reserved.  Introduction to Bowel Health Diet and daily habits can help you predict when your bowels will move on a regular basis.  The consistency and quantity of the stool is usually more important than the frequency.  The goal is to have a regular bowel movement that is soft but formed.   Tips on Emptying Regularly . Eat breakfast.  Usually the best time of day for a bowel movement will be a half hour to an hour after eating.  These times are best because the body uses the gastrocolic reflex, a stimulation of bowel motion that occurs with eating, to help produce a bowel movement.  For some people even a simple hot drink in the morning can help the reflex action begin. . Eat all your meals at a predictable time each day.  The bowel functions best when food is introduced at the same regular intervals. . The amount of food eaten at a given time of day should be about the same size from day to day.  The bowel functions best when food is introduced in similar quantities from day to day. It is fine to have a small breakfast and a large lunch, or vice versa, just  be consistent. . Eat two servings of fruit or vegetables and at least one serving of a complex carbohydrates (whole grains such as brown rice, bran, whole wheat bread, or oatmeal) at each meal. . Drink plenty of water-ideally eight glasses a day.  Be sure to increase your water intake if you are increasing fiber into your diet.  Maintain Healthy Habits . Exercise daily.  You may exercise at any time of day, but you may find that bowel function is helped most if the exercise is at a consistent time each day. . Make sure that you are not rushed and have convenient access to a bathroom at your selected time to empty your bowels. [facility] [address] [place], [place] [address] Phone # [phone] Fax [phone]

## 2017-02-03 ENCOUNTER — Ambulatory Visit (INDEPENDENT_AMBULATORY_CARE_PROVIDER_SITE_OTHER): Payer: Medicare Other

## 2017-02-03 DIAGNOSIS — Z5181 Encounter for therapeutic drug level monitoring: Secondary | ICD-10-CM | POA: Diagnosis not present

## 2017-02-03 DIAGNOSIS — I4891 Unspecified atrial fibrillation: Secondary | ICD-10-CM | POA: Diagnosis not present

## 2017-02-03 LAB — POCT INR: INR: 2.5

## 2017-02-03 NOTE — Patient Instructions (Signed)
Continue on same dosage 1 tablet daily except 1/2 tablet on Saturdays.  Recheck INR in 3 weeks.  Call us with any medication changes #  [phone]

## 2017-02-04 DIAGNOSIS — M25562 Pain in left knee: Secondary | ICD-10-CM | POA: Diagnosis not present

## 2017-02-09 ENCOUNTER — Other Ambulatory Visit: Payer: Self-pay | Admitting: Cardiology

## 2017-02-10 ENCOUNTER — Encounter: Payer: Self-pay | Admitting: Physical Therapy

## 2017-02-10 ENCOUNTER — Ambulatory Visit: Payer: Medicare Other | Attending: Internal Medicine | Admitting: Physical Therapy

## 2017-02-10 DIAGNOSIS — R278 Other lack of coordination: Secondary | ICD-10-CM | POA: Diagnosis not present

## 2017-02-10 DIAGNOSIS — M6281 Muscle weakness (generalized): Secondary | ICD-10-CM | POA: Insufficient documentation

## 2017-02-10 NOTE — Therapy (Signed)
[facility] [address], [place], [place], [address] Phone: [phone]   Fax:  [phone]  Physical Therapy Treatment  Patient Details  Name: [name] MRN: [id number] Date of Birth: 1929-03-27 Referring Provider: Dr. Silvano Rusk   Encounter Date: 02/10/2017  PT End of Session - 02/10/17 1525    Visit Number  2    Number of Visits  10    Date for PT Re-Evaluation  03/29/17    Authorization Type  Medicare BCBS    PT Start Time  1275    PT Stop Time  1525    PT Time Calculation (min)  40 min    Activity Tolerance  Patient tolerated treatment well    Behavior During Therapy  [facility] for tasks assessed/performed       Past Medical History:  Diagnosis Date  . Arthritis    "back" (03/20/2014)  . Atrial fibrillation ([place])   . BPH (benign prostatic hypertrophy)   . Breast mass    "on both sides" (06/28/2012)  . Cardiomyopathy primary-nonischemic EF 45%  . CHF (congestive heart failure) ([place])   . Coronary artery disease   . Diverticulosis of colon without hemorrhage 01/03/2014  . Dysphagia   . Elevated liver enzymes   . Esophageal stricture   . GERD (gastroesophageal reflux disease)   . Hearing loss, mixed, bilateral   . Hx of cardiovascular stress test    Adenosine Myoview (07/2013):  Apical cap, apical lateral and mid anterolateral scar, small amount of peri-infarct ischemia, EF 36%; Medium Risk  . Hyperlipidemia   . Increased prostate specific antigen (PSA) velocity   . Internal hemorrhoids 01/03/2014  . Lumbar back pain   . Pacemaker    st jude  . Pacemaker infection ([place])    06/28/12  . Polymyalgia rheumatica ([place])   . Sick sinus syndrome [facility])     Past Surgical History:  Procedure Laterality Date  . AV NODE ABLATION N/A 04/20/2016   Procedure: AV Node Ablation;  Surgeon: Evans Lance, MD;  Location: [place] CV LAB;  Service: Cardiovascular;  Laterality: N/A;  . BREAST SURGERY    .  CARDIAC CATHETERIZATION  1980's   [place]  . CARDIOVERSION  06/17/2011   Procedure: CARDIOVERSION;  Surgeon: Lelon Perla, MD;  Location: [place];  Service: Cardiovascular;  Laterality: N/A;  . CARDIOVERSION  09/09/2011   Procedure: CARDIOVERSION;  Surgeon: Carlena Bjornstad, MD;  Location: [place];  Service: Cardiovascular;  Laterality: N/A;  . CARDIOVERSION  01/01/2012   Procedure: CARDIOVERSION;  Surgeon: Hillary Bow, MD;  Location: [facility] ENDOSCOPY;  Service: Cardiovascular;  Laterality: N/A;  . CARDIOVERSION N/A 08/03/2014   Procedure: CARDIOVERSION;  Surgeon: Larey Dresser, MD;  Location: [facility] ENDOSCOPY;  Service: Cardiovascular;  Laterality: N/A;  . CARDIOVERSION N/A 11/07/2015   Procedure: CARDIOVERSION;  Surgeon: Larey Dresser, MD;  Location: [facility] ENDOSCOPY;  Service: Cardiovascular;  Laterality: N/A;  . CARDIOVERSION N/A 11/20/2015   Procedure: CARDIOVERSION;  Surgeon: Larey Dresser, MD;  Location: [place];  Service: Cardiovascular;  Laterality: N/A;  . CATARACT EXTRACTION, BILATERAL Bilateral 1980's  . COLONOSCOPY N/A 01/03/2014   Procedure: COLONOSCOPY;  Surgeon: Gatha Mayer, MD;  Location: [facility] ENDOSCOPY;  Service: Endoscopy;  Laterality: N/A;  . ESOPHAGOGASTRODUODENOSCOPY    . GENERATOR REMOVAL Left 06/15/2012   Procedure: GENERATOR REMOVAL;  Surgeon: Evans Lance, MD;  Location: [place];  Service: Cardiovascular;  Laterality: Left;  . HEMORRHOID BANDING    .  INGUINAL HERNIA REPAIR Right 2012; 03/20/2014  . INGUINAL HERNIA REPAIR Right 03/20/2014   Procedure: OPEN REPAIR OF RIGHT INGUINAL WITH MESH;  Surgeon: Donnie Mesa, MD;  Location: [place];  Service: General;  Laterality: Right;  . INSERT / REPLACE / REMOVE PACEMAKER    . INSERTION OF MESH Right 03/20/2014   Procedure: INSERTION OF MESH;  Surgeon: Donnie Mesa, MD;  Location: [place];  Service: General;  Laterality: Right;  . KNEE ARTHROSCOPY WITH LATERAL MENISECTOMY Left 12/16/2016   Procedure: KNEE ARTHROSCOPY WITH LATERAL  MENISECTOMY and Chondroplasty;  Surgeon: Craig Server, MD;  Location: [place];  Service: Orthopedics;  Laterality: Left;  . LEAD REVISION N/A 06/29/2012   Procedure: LEAD REVISION;  Surgeon: Deboraha Sprang, MD;  Location: [facility] CATH LAB;  Service: Cardiovascular;  Laterality: N/A;  . PACEMAKER GENERATOR CHANGE N/A 06/15/2012   Procedure: PACEMAKER GENERATOR CHANGE;  Surgeon: Evans Lance, MD;  Location: [place];  Service: Cardiovascular;  Laterality: N/A;  . PACEMAKER REVISION  06/30/2012   Procedure: PACEMAKER LEAD REVISION;  Surgeon: Evans Lance, MD;  Location: [facility] CATH LAB;  Service: Cardiovascular;;  . PARACENTESIS  ~ 0/2409   complication from pacer change in 2014  . PERMANENT PACEMAKER INSERTION N/A 06/28/2012   Procedure: PERMANENT PACEMAKER INSERTION;  Surgeon: Evans Lance, MD;  Location: [facility] CATH LAB;  Service: Cardiovascular;  Laterality: N/A;  . TEE WITHOUT CARDIOVERSION  01/01/2012   Procedure: TRANSESOPHAGEAL ECHOCARDIOGRAM (TEE);  Surgeon: Fay Records, MD;  Location: [facility] ENDOSCOPY;  Service: Cardiovascular;  Laterality: N/A;  . TEE WITHOUT CARDIOVERSION N/A 08/03/2014   Procedure: TRANSESOPHAGEAL ECHOCARDIOGRAM (TEE);  Surgeon: Larey Dresser, MD;  Location: [place];  Service: Cardiovascular;  Laterality: N/A;  . TEE WITHOUT CARDIOVERSION N/A 11/07/2015   Procedure: TRANSESOPHAGEAL ECHOCARDIOGRAM (TEE);  Surgeon: Larey Dresser, MD;  Location: [place];  Service: Cardiovascular;  Laterality: N/A;  . TONSILLECTOMY AND ADENOIDECTOMY  1938  . UMBILICAL HERNIA REPAIR  [phone] X [phone]    There were no vitals filed for this visit.  Subjective Assessment - 02/10/17 1449    Subjective  The fecal incontinence comes and goes.      Patient Stated Goals  reduce fecal soiling    Currently in Pain?  No/denies    Multiple Pain Sites  No                      [facility] Adult PT Treatment/Exercise - 02/10/17 0001      Therapeutic Activites    Therapeutic Activities  Other  Therapeutic Activities    Other Therapeutic Activities  sitting with knees above hips and instruction on correct toileting technique, belly breathing and how not to tighten his toes or face so pelvic floor will relax      Lumbar Exercises: Supine   Clam  1 second;15 reps bil. with abdominal bracing      Manual Therapy   Manual Therapy  Soft tissue mobilization    Soft tissue mobilization  circular abodminal massage              PT Education - 02/10/17 1515    Education provided  Yes    Education Details  abdominal massage; pelvic floor contracion in hookly, toileting technique    Person(s) Educated  Patient    Methods  Explanation;Demonstration;Verbal cues;Handout    Comprehension  Returned demonstration;Verbalized understanding       PT Short Term Goals - 02/10/17 1529  PT SHORT TERM GOAL #1   Title  independent with initial HEP    Baseline  still learning    Time  4    Period  Weeks    Status  On-going      PT SHORT TERM GOAL #2   Title  able to contract pelvic floor with a lift with lower abdominal contraction due to improved pelvic floor strength and coordinatioin    Time  4    Period  Weeks    Status  On-going      PT SHORT TERM GOAL #3   Title  fecal incontinence decreased >/= 25% due to increased strength    Time  4    Period  Weeks    Status  On-going      PT SHORT TERM GOAL #4   Title  understand what bowel health is by adding more vegetables and fiber in his diet    Time  4    Period  Weeks    Status  On-going        PT Long Term Goals - 02/01/17 0909      PT LONG TERM GOAL #1   Title  independent with HEP and how to progress himselft    Time  8    Period  Weeks    Status  New    Target Date  03/29/17      PT LONG TERM GOAL #2   Title  pelvic floor strength is >/= 4/5 with 10 second hold to reduce fecal incontinence >/= 75%    Time  8    Period  Weeks    Status  New    Target Date  03/29/17      PT LONG TERM GOAL #3   Title   abdominal strength >/= 3/5 with minimal to no midline abdominal protrusion due to improved quality of contraction    Time  8    Period  Weeks    Status  New    Target Date  03/29/17      PT LONG TERM GOAL #4   Title  use 1 to no pads due to reduction of fecal leakage >/= 75%    Time  8    Period  Weeks    Status  New    Target Date  03/29/17            Plan - 02/10/17 1525    Clinical Impression Statement  Patient able to contract his abdominals for abdominal bracing correctly.  Patient needed tactlle cues to the pelvic floor outside his pants to contract the pelvic floor with lift during abdominal exercises.  Patient is able to hold pelvic floor for 5 seconds while breating.  Patient does better with pelvic contraction while he is squeezing a ball between his knees.  Patient does not notice a difference yet due to just starting therapy.  Patient will benefit from skilled therapy to improve pelvic floor strength to reduce fecal incontinence.     Rehab Potential  Excellent    Clinical Impairments Affecting Rehab Potential  Pacemaker; CHF; s/p inguinal hernia repair    PT Frequency  1x / week    PT Duration  8 weeks    PT Treatment/Interventions  Neuromuscular re-education;Therapeutic activities;Therapeutic exercise;Patient/family education;Manual techniques    PT Next Visit Plan  Pelvic floor EMG with HEP; discuss with patient on fiber    PT Home Exercise Plan  progress as needed    Recommended Other  Services  MD has signed initial note    Consulted and Agree with Plan of Care  Patient       Patient will benefit from skilled therapeutic intervention in order to improve the following deficits and impairments:  Decreased coordination, Decreased strength  Visit Diagnosis: Muscle weakness (generalized)  Other lack of coordination     Problem List Patient Active Problem List   Diagnosis Date Noted  . Left knee pain 10/23/2016  . Prolapsed internal hemorrhoids, grade 2 -  causing fecal seepage 06/10/2016  . Atrial fibrillation with tachycardic ventricular rate ([place]) 04/20/2016  . Chronic anticoagulation 02/10/2016  . AK (actinic keratosis) 01/28/2016  . Left breast lump 07/15/2015  . Lipoma 07/08/2015  . Neuropathy 05/16/2015  . Trigger thumb of right hand 07/04/2014  . Encounter for therapeutic drug monitoring 07/02/2014  . Allergic rhinitis 02/14/2014  . Fecal incontinence 10/18/2013  . Chronic cough 08/18/2013  . Pleural effusion, right 08/16/2013  . Chronic systolic CHF (congestive heart failure) ([place]) 03/21/2013  . Recurrent right inguinal hernia 11/22/2012  . Intention tremor 11/01/2012  . Pleural effusion on right 07/06/2012  . Nonischemic cardiomyopathy ([place]) 09/20/2010  . PPM-St.Jude 11/27/2008  . CAD (coronary artery disease) 07/20/2008  . SICK SINUS SYNDROME 07/20/2008  . Polymyalgia rheumatica ([place]) 07/03/2008  . MIXED HEARING LOSS BILATERAL 01/10/2008  . Hyperlipidemia 03/16/2007  . Atrial fibrillation ([place]) 03/16/2007  . GERD 03/16/2007  . BPH (benign prostatic hyperplasia) 03/16/2007    [name], PT 02/10/17 3:31 PM   [facility] [address] [place], [place], [address] Phone: [phone]   Fax:  [phone]  Name: [name] MRN: [id number] Date of Birth: 10-30-1929

## 2017-02-10 NOTE — Patient Instructions (Addendum)
About Abdominal Massage  Abdominal massage, also called external colon massage, is a self-treatment circular massage technique that can reduce and eliminate gas and ease constipation. The colon naturally contracts in waves in a clockwise direction starting from inside the right hip, moving up toward the ribs, across the belly, and down inside the left hip.  When you perform circular abdominal massage, you help stimulate your colon's normal wave pattern of movement called peristalsis.  It is most beneficial when done after eating.  Positioning You can practice abdominal massage with oil while lying down, or in the shower with soap.  Some people find that it is just as effective to do the massage through clothing while sitting or standing.  How to Massage Start by placing your finger tips or knuckles on your right side, just inside your hip bone.  . Make small circular movements while you move upward toward your rib cage.   . Once you reach the bottom right side of your rib cage, take your circular movements across to the left side of the bottom of your rib cage.  . Next, move downward until you reach the inside of your left hip bone.  This is the path your feces travel in your colon. . Continue to perform your abdominal massage in this pattern for 10 minutes each day.     You can apply as much pressure as is comfortable in your massage.  Start gently and build pressure as you continue to practice.  Notice any areas of pain as you massage; areas of slight pain may be relieved as you massage, but if you have areas of significant or intense pain, consult with your healthcare provider.  Other Considerations . General physical activity including bending and stretching can have a beneficial massage-like effect on the colon.  Deep breathing can also stimulate the colon because breathing deeply activates the same nervous system that supplies the colon.   . Abdominal massage should always be used in  combination with a bowel-conscious diet that is high in the proper type of fiber for you, fluids (primarily water), and a regular exercise program. Bracing With Leg March (Hook-Lying)    With neutral spine, tighten pelvic floor and abdominals and hold. Alternating legs, lift foot __6_ inches and return to floor. Repeat _10__ times. Do _1__ times a day.   Copyright  VHI. All rights reserved.  Adduction: Hip - Knees Together (Hook-Lying)    Lie with hips and knees bent, towel roll between knees. Push knees together. Contract the pelvic floor.  Hold for _5__ seconds. Rest for __5_ seconds. Repeat __10_ times. Do _1__ times a day.   Copyright  VHI. All rights reserved.  External Rotation: Hip - Knees Apart (Hook-Lying)    Lie with hips and knees bent, band tied just above knees. Pull knees apart. Hold for _5__ seconds. Rest for _5__ seconds. Repeat _10__ times. Do __1_ times a day.  Copyright  VHI. All rights reserved.  Toileting Techniques for Bowel Movements (Defecation) Using your belly (abdomen) and pelvic floor muscles to have a bowel movement is usually instinctive.  Sometimes people can have problems with these muscles and have to relearn proper defecation (emptying) techniques.  If you have weakness in your muscles, organs that are falling out, decreased sensation in your pelvis, or ignore your urge to go, you may find yourself straining to have a bowel movement.  You are straining if you are: . holding your breath or taking in a huge gulp of  air and holding it  . keeping your lips and jaw tensed and closed tightly . turning red in the face because of excessive pushing or forcing . developing or worsening your  hemorrhoids . getting faint while pushing . not emptying completely and have to defecate many times a day  If you are straining, you are actually making it harder for yourself to have a bowel movement.  Many people find they are pulling up with the pelvic floor muscles  and closing off instead of opening the anus. Due to lack pelvic floor relaxation and coordination the abdominal muscles, one has to work harder to push the feces out.  Many people have never been taught how to defecate efficiently and effectively.  Notice what happens to your body when you are having a bowel movement.  While you are sitting on the toilet pay attention to the following areas: . Jaw and mouth position . Angle of your hips   . Whether your feet touch the ground or not . Arm placement  . Spine position . Waist . Belly tension . Anus (opening of the anal canal)  An Evacuation/Defecation Plan   Here are the 4 basic points:  1. Lean forward enough for your elbows to rest on your knees 2. Support your feet on the floor or use a low stool if your feet don't touch the floor  3. Push out your belly as if you have swallowed a beach ball-you should feel a widening of your waist 4. Open and relax your pelvic floor muscles, rather than tightening around the anus      The following conditions my require modifications to your toileting posture:  . If you have had surgery in the past that limits your back, hip, pelvic, knee or ankle flexibility . Constipation   Your healthcare practitioner may make the following additional suggestions and adjustments:  1) Sit on the toilet  a) Make sure your feet are supported. b) Notice your hip angle and spine position-most people find it effective to lean forward or raise their knees, which can help the muscles around the anus to relax  c) When you lean forward, place your forearms on your thighs for support  2) Relax suggestions a) Breath deeply in through your nose and out slowly through your mouth as if you are smelling the flowers and blowing out the candles. b) To become aware of how to relax your muscles, contracting and releasing muscles can be helpful.  Pull your pelvic floor muscles in tightly by using the image of holding back gas, or  closing around the anus (visualize making a circle smaller) and lifting the anus up and in.  Then release the muscles and your anus should drop down and feel open. Repeat 5 times ending with the feeling of relaxation. c) Keep your pelvic floor muscles relaxed; let your belly bulge out. d) The digestive tract starts at the mouth and ends at the anal opening, so be sure to relax both ends of the tube.  Place your tongue on the roof of your mouth with your teeth separated.  This helps relax your mouth and will help to relax the anus at the same time.  3) Empty (defecation) a) Keep your pelvic floor and sphincter relaxed, then bulge your anal muscles.  Make the anal opening wide.  b) Stick your belly out as if you have swallowed a beach ball. c) Make your belly wall hard using your belly muscles while continuing to breathe.  Doing this makes it easier to open your anus. d) Breath out and give a grunt (or try using other sounds such as ahhhh, shhhhh, ohhhh or grrrrrrr).  4) Finish a) As you finish your bowel movement, pull the pelvic floor muscles up and in.  This will leave your anus in the proper place rather than remaining pushed out and down. If you leave your anus pushed out and down, it will start to feel as though that is normal and give you incorrect signals about needing to have a bowel movement.    Earlie Counts, PT [facility] [address] [place] [address] [place], [place] [address]

## 2017-02-15 ENCOUNTER — Encounter: Payer: Medicare Other | Admitting: Physical Therapy

## 2017-02-17 ENCOUNTER — Encounter: Payer: Medicare Other | Admitting: Internal Medicine

## 2017-02-19 ENCOUNTER — Ambulatory Visit (INDEPENDENT_AMBULATORY_CARE_PROVIDER_SITE_OTHER): Payer: Medicare Other | Admitting: Family Medicine

## 2017-02-19 ENCOUNTER — Encounter: Payer: Self-pay | Admitting: Family Medicine

## 2017-02-19 VITALS — BP 94/60 | HR 70 | Temp 97.3°F | Ht 66.0 in | Wt 153.6 lb

## 2017-02-19 DIAGNOSIS — I2510 Atherosclerotic heart disease of native coronary artery without angina pectoris: Secondary | ICD-10-CM

## 2017-02-19 DIAGNOSIS — M353 Polymyalgia rheumatica: Secondary | ICD-10-CM

## 2017-02-19 LAB — CBC
HCT: 41.5 % (ref 39.0–52.0)
Hemoglobin: 13.9 g/dL (ref 13.0–17.0)
MCHC: 33.6 g/dL (ref 30.0–36.0)
MCV: 92.5 fl (ref 78.0–100.0)
PLATELETS: 228 10*3/uL (ref 150.0–400.0)
RBC: 4.49 Mil/uL (ref 4.22–5.81)
RDW: 14 % (ref 11.5–15.5)
WBC: 8.8 10*3/uL (ref 4.0–10.5)

## 2017-02-19 LAB — COMPREHENSIVE METABOLIC PANEL
ALBUMIN: 4.5 g/dL (ref 3.5–5.2)
ALT: 17 U/L (ref 0–53)
AST: 23 U/L (ref 0–37)
Alkaline Phosphatase: 82 U/L (ref 39–117)
BILIRUBIN TOTAL: 1 mg/dL (ref 0.2–1.2)
BUN: 23 mg/dL (ref 6–23)
CALCIUM: 9.1 mg/dL (ref 8.4–10.5)
CHLORIDE: 101 meq/L (ref 96–112)
CO2: 29 meq/L (ref 19–32)
CREATININE: 0.98 mg/dL (ref 0.40–1.50)
GFR: 77.12 mL/min (ref >60.00)
Glucose, Bld: 84 mg/dL (ref 70–99)
Potassium: 4.2 mEq/L (ref 3.5–5.1)
SODIUM: 138 meq/L (ref 135–145)
Total Protein: 7.3 g/dL (ref 6.0–8.3)

## 2017-02-19 LAB — SEDIMENTATION RATE: Sed Rate: 6 mm/hr (ref 0–20)

## 2017-02-19 LAB — C-REACTIVE PROTEIN: CRP: 0.3 mg/dL — AB (ref 0.5–20.0)

## 2017-02-19 MED ORDER — PREDNISONE 5 MG PO TABS: 5.0000 mg | ORAL_TABLET | Freq: Every day | ORAL | 0 refills | Status: DC

## 2017-02-19 NOTE — Progress Notes (Signed)
Subjective:  [name] is a 83 y.o. year old very pleasant male patient who presents for/with See problem oriented charting ROS- no fever, chills. No significant increased fatigue. No nausea or vomiting.    Past Medical History-  Patient Active Problem List   Diagnosis Date Noted  . Chronic systolic CHF (congestive heart failure) ([place]) 03/21/2013    Priority: High  . Nonischemic cardiomyopathy ([place]) 09/20/2010    Priority: High  . PPM-St.Jude 11/27/2008    Priority: High  . CAD (coronary artery disease) 07/20/2008    Priority: High  . SICK SINUS SYNDROME 07/20/2008    Priority: High  . Polymyalgia rheumatica ([place]) 07/03/2008    Priority: High  . Atrial fibrillation ([place]) 03/16/2007    Priority: High  . AK (actinic keratosis) 01/28/2016    Priority: Medium  . Lipoma 07/08/2015    Priority: Medium  . Neuropathy 05/16/2015    Priority: Medium  . Chronic cough 08/18/2013    Priority: Medium  . Pleural effusion, right 08/16/2013    Priority: Medium  . Pleural effusion on right 07/06/2012    Priority: Medium  . Hyperlipidemia 03/16/2007    Priority: Medium  . BPH (benign prostatic hyperplasia) 03/16/2007    Priority: Medium  . Left breast lump 07/15/2015    Priority: Low  . Encounter for therapeutic drug monitoring 07/02/2014    Priority: Low  . Allergic rhinitis 02/14/2014    Priority: Low  . Fecal incontinence 10/18/2013    Priority: Low  . Recurrent right inguinal hernia 11/22/2012    Priority: Low  . Intention tremor 11/01/2012    Priority: Low  . MIXED HEARING LOSS BILATERAL 01/10/2008    Priority: Low  . GERD 03/16/2007    Priority: Low  . Left knee pain 10/23/2016  . Prolapsed internal hemorrhoids, grade 2 - causing fecal seepage 06/10/2016  . Atrial fibrillation with tachycardic ventricular rate ([place]) 04/20/2016  . Chronic anticoagulation 02/10/2016  . Trigger thumb of right hand 07/04/2014    Medications- reviewed and updated Current Outpatient  Medications  Medication Sig Dispense Refill  . acetaminophen (TYLENOL) 500 MG tablet Take 1,000 mg by mouth every 6 (six) hours as needed for moderate pain or headache.    . fluticasone (FLONASE) 50 MCG/ACT nasal spray Place 2 sprays into both nostrils daily as needed for allergies or rhinitis. 16 g 3  . furosemide (LASIX) 80 MG tablet Take 1 tablet (80 mg total) by mouth daily. 90 tablet 3  . hydroxypropyl methylcellulose / hypromellose (ISOPTO TEARS / GONIOVISC) 2.5 % ophthalmic solution Place 1 drop into both eyes daily as needed for dry eyes.    Marland Kitchen KLOR-CON M20 20 MEQ tablet TAKE 1 TABLET TWICE DAILY 180 tablet 2  . losartan (COZAAR) 25 MG tablet Take 0.5 tablets (12.5 mg total) by mouth daily. 90 tablet 3  . metoprolol succinate (TOPROL-XL) 25 MG 24 hr tablet Take 0.5 tablets (12.5 mg total) by mouth at bedtime. 15 tablet 3  . pravastatin (PRAVACHOL) 80 MG tablet Take 0.5 tablets (40 mg total) by mouth daily. 45 tablet 3  . spironolactone (ALDACTONE) 25 MG tablet Take 0.5 tablets (12.5 mg total) by mouth every evening. 45 tablet 3  . tamsulosin (FLOMAX) 0.4 MG CAPS capsule Take 1 capsule (0.4 mg total) by mouth daily. 90 capsule 3  . warfarin (COUMADIN) 5 MG tablet TAKE 1/2 TO 1 TABLET (2.5 TO 5 MG TOTAL) DAILY AS DIRECTED BY ANTICOAGULATION CLINIC (Patient taking differently: Take 2.5-5 mg by mouth  See admin instructions. Take 2.5 mg on Tues / Sat  Take 5 mg on Mon / Wed / Thurs / Fri / Sun) 90 tablet 3   No current facility-administered medications for this visit.     Objective: BP 94/60 (BP Location: Left Arm, Patient Position: Sitting, Cuff Size: Large)   Pulse 70   Temp (!) 97.3 F (36.3 C) (Oral)   Ht '5\' 6"'  (1.676 m)   Wt 153 lb 9.6 oz (69.7 kg)   SpO2 98%   BMI 24.79 kg/m  Gen: NAD, resting comfortably CV: RRR no murmurs rubs or gallops Lungs: CTAB no crackles, wheeze, rhonchi Ext: no edema Skin: warm, dry Neuro: 4/5 strength in upper and lower extremities proximally hip  and shoulder. 5/5 strength at biceps bilaterally and in lower leg extension. Seems to have more difficulty standing than previous  Assessment/Plan:  Polymyalgia rheumatica (HCC) S: 2-3 weeks of stiffness and pain in upper thighs and shoulders when first gets up each morning and anytime he sits for a period of time. Gets better as he gets moving. Seems to be worsening. Feels very similar to when diagnosed with PMR previously. Happened around 2010 and reports #s ( I assume ESR, CRP) were normal but had drastic response to several weeks of prednisone. Does not sound like he took it for months though. Having trouble getting up from chair at times but once he gets moving he improves.  A/P: Proximal muscle weakness and stiffness. We will get ESR, CRP, CBC, CMP. Certainly sounds like PMR. Will start on prednisone with 2-3 week follow up. Sounds like rather short course of prednisone and inflammatory markers were not up in the past- possibly not PMR- if has rapid response and #s not elevated- would see if we could get him off within 2 weeks   Future Appointments  Date Time Provider [place]  02/22/2017  8:45 AM Monico Hoar, PT [facility]  02/24/2017  8:45 AM [facility]  03/04/2017  8:45 AM Monico Hoar, PT [facility]   Orders Placed This Encounter  Procedures  . CBC    [place]  . Comprehensive metabolic panel    [place]  . Sedimentation rate    [place]  . C-reactive protein    Meds ordered this encounter  Medications  . predniSONE (DELTASONE) 5 MG tablet    Sig: Take 1 tablet (5 mg total) by mouth daily with breakfast.    Dispense:  60 tablet    Refill:  0    Return precautions advised.  Garret Reddish, MD

## 2017-02-19 NOTE — Patient Instructions (Addendum)
Schedule for 9 30 on January 2nd- this is a same day appointment slot but I want them to use it for you  Please stop by lab before you go  Take 3 prednisone a day until next visit. If symptoms resolve within 10 days- call me and we will more rapidly taper you off if we can.

## 2017-02-19 NOTE — Assessment & Plan Note (Signed)
S: 2-3 weeks of stiffness and pain in upper thighs and shoulders when first gets up each morning and anytime he sits for a period of time. Gets better as he gets moving. Seems to be worsening. Feels very similar to when diagnosed with PMR previously. Happened around 2010 and reports #s ( I assume ESR, CRP) were normal but had drastic response to several weeks of prednisone. Does not sound like he took it for months though. Having trouble getting up from chair at times but once he gets moving he improves.  A/P: Proximal muscle weakness and stiffness. We will get ESR, CRP, CBC, CMP. Certainly sounds like PMR. Will start on prednisone with 2-3 week follow up. Sounds like rather short course of prednisone and inflammatory markers were not up in the past- possibly not PMR- if has rapid response and #s not elevated- would see if we could get him off within 2 weeks

## 2017-02-22 ENCOUNTER — Encounter: Payer: Self-pay | Admitting: Physical Therapy

## 2017-02-22 ENCOUNTER — Telehealth: Payer: Self-pay | Admitting: Family Medicine

## 2017-02-22 ENCOUNTER — Ambulatory Visit: Payer: Medicare Other | Admitting: Physical Therapy

## 2017-02-22 DIAGNOSIS — M6281 Muscle weakness (generalized): Secondary | ICD-10-CM

## 2017-02-22 DIAGNOSIS — R278 Other lack of coordination: Secondary | ICD-10-CM | POA: Diagnosis not present

## 2017-02-22 NOTE — Patient Instructions (Addendum)
Sit to Stand: Phase 3    Sitting, squeeze pelvic floor and hold. Lean trunk forward. Breath out. Push up on arms and stand up. Relax. When you are standing up from a chair and sit down in a chair.  Copyright  VHI. All rights reserved.  Squat    Standing, squeeze pelvic floor and hold. Squat. Relax. Repeat __3_ times. Do __1_ times a day. When you squat Copyright  VHI. All rights reserved.  [facility] [address] [place], [place] [address] Phone # [phone] Fax [phone]

## 2017-02-22 NOTE — Therapy (Signed)
[facility] [address] [place], [place], [address] Phone: [phone]   Fax:  [phone]  Physical Therapy Treatment  Patient Details  Name: [name] MRN: [id number] Date of Birth: 05/07/1929 Referring Provider: Dr. Silvano Rusk   Encounter Date: 02/22/2017  PT End of Session - 02/22/17 0924    Visit Number  3    Number of Visits  10    Date for PT Re-Evaluation  03/29/17    Authorization Type  Medicare BCBS    PT Start Time  0845    PT Stop Time  0925    PT Time Calculation (min)  40 min    Activity Tolerance  Patient tolerated treatment well    Behavior During Therapy  [facility] for tasks assessed/performed       Past Medical History:  Diagnosis Date  . Arthritis    "back" (03/20/2014)  . Atrial fibrillation ([place])   . BPH (benign prostatic hypertrophy)   . Breast mass    "on both sides" (06/28/2012)  . Cardiomyopathy primary-nonischemic EF 45%  . CHF (congestive heart failure) ([place])   . Coronary artery disease   . Diverticulosis of colon without hemorrhage 01/03/2014  . Dysphagia   . Elevated liver enzymes   . Esophageal stricture   . GERD (gastroesophageal reflux disease)   . Hearing loss, mixed, bilateral   . Hx of cardiovascular stress test    Adenosine Myoview (07/2013):  Apical cap, apical lateral and mid anterolateral scar, small amount of peri-infarct ischemia, EF 36%; Medium Risk  . Hyperlipidemia   . Increased prostate specific antigen (PSA) velocity   . Internal hemorrhoids 01/03/2014  . Lumbar back pain   . Pacemaker    st jude  . Pacemaker infection ([place])    06/28/12  . Polymyalgia rheumatica ([place])   . Sick sinus syndrome [facility])     Past Surgical History:  Procedure Laterality Date  . AV NODE ABLATION N/A 04/20/2016   Procedure: AV Node Ablation;  Surgeon: Evans Lance, MD;  Location: [place] CV LAB;  Service: Cardiovascular;  Laterality: N/A;  . BREAST SURGERY    .  CARDIAC CATHETERIZATION  1980's   [place]  . CARDIOVERSION  06/17/2011   Procedure: CARDIOVERSION;  Surgeon: Lelon Perla, MD;  Location: [place];  Service: Cardiovascular;  Laterality: N/A;  . CARDIOVERSION  09/09/2011   Procedure: CARDIOVERSION;  Surgeon: Carlena Bjornstad, MD;  Location: [place];  Service: Cardiovascular;  Laterality: N/A;  . CARDIOVERSION  01/01/2012   Procedure: CARDIOVERSION;  Surgeon: Hillary Bow, MD;  Location: [facility] ENDOSCOPY;  Service: Cardiovascular;  Laterality: N/A;  . CARDIOVERSION N/A 08/03/2014   Procedure: CARDIOVERSION;  Surgeon: Larey Dresser, MD;  Location: [facility] ENDOSCOPY;  Service: Cardiovascular;  Laterality: N/A;  . CARDIOVERSION N/A 11/07/2015   Procedure: CARDIOVERSION;  Surgeon: Larey Dresser, MD;  Location: [facility] ENDOSCOPY;  Service: Cardiovascular;  Laterality: N/A;  . CARDIOVERSION N/A 11/20/2015   Procedure: CARDIOVERSION;  Surgeon: Larey Dresser, MD;  Location: [place];  Service: Cardiovascular;  Laterality: N/A;  . CATARACT EXTRACTION, BILATERAL Bilateral 1980's  . COLONOSCOPY N/A 01/03/2014   Procedure: COLONOSCOPY;  Surgeon: Gatha Mayer, MD;  Location: [facility] ENDOSCOPY;  Service: Endoscopy;  Laterality: N/A;  . ESOPHAGOGASTRODUODENOSCOPY    . GENERATOR REMOVAL Left 06/15/2012   Procedure: GENERATOR REMOVAL;  Surgeon: Evans Lance, MD;  Location: [place];  Service: Cardiovascular;  Laterality: Left;  . HEMORRHOID BANDING    .  INGUINAL HERNIA REPAIR Right 2012; 03/20/2014  . INGUINAL HERNIA REPAIR Right 03/20/2014   Procedure: OPEN REPAIR OF RIGHT INGUINAL WITH MESH;  Surgeon: Donnie Mesa, MD;  Location: [place];  Service: General;  Laterality: Right;  . INSERT / REPLACE / REMOVE PACEMAKER    . INSERTION OF MESH Right 03/20/2014   Procedure: INSERTION OF MESH;  Surgeon: Donnie Mesa, MD;  Location: [place];  Service: General;  Laterality: Right;  . KNEE ARTHROSCOPY WITH LATERAL MENISECTOMY Left 12/16/2016   Procedure: KNEE ARTHROSCOPY WITH LATERAL  MENISECTOMY and Chondroplasty;  Surgeon: Earlie Server, MD;  Location: [place];  Service: Orthopedics;  Laterality: Left;  . LEAD REVISION N/A 06/29/2012   Procedure: LEAD REVISION;  Surgeon: Deboraha Sprang, MD;  Location: [facility] CATH LAB;  Service: Cardiovascular;  Laterality: N/A;  . PACEMAKER GENERATOR CHANGE N/A 06/15/2012   Procedure: PACEMAKER GENERATOR CHANGE;  Surgeon: Evans Lance, MD;  Location: [place];  Service: Cardiovascular;  Laterality: N/A;  . PACEMAKER REVISION  06/30/2012   Procedure: PACEMAKER LEAD REVISION;  Surgeon: Evans Lance, MD;  Location: [facility] CATH LAB;  Service: Cardiovascular;;  . PARACENTESIS  ~ 05/3293   complication from pacer change in 2014  . PERMANENT PACEMAKER INSERTION N/A 06/28/2012   Procedure: PERMANENT PACEMAKER INSERTION;  Surgeon: Evans Lance, MD;  Location: [facility] CATH LAB;  Service: Cardiovascular;  Laterality: N/A;  . TEE WITHOUT CARDIOVERSION  01/01/2012   Procedure: TRANSESOPHAGEAL ECHOCARDIOGRAM (TEE);  Surgeon: Fay Records, MD;  Location: [facility] ENDOSCOPY;  Service: Cardiovascular;  Laterality: N/A;  . TEE WITHOUT CARDIOVERSION N/A 08/03/2014   Procedure: TRANSESOPHAGEAL ECHOCARDIOGRAM (TEE);  Surgeon: Larey Dresser, MD;  Location: [place];  Service: Cardiovascular;  Laterality: N/A;  . TEE WITHOUT CARDIOVERSION N/A 11/07/2015   Procedure: TRANSESOPHAGEAL ECHOCARDIOGRAM (TEE);  Surgeon: Larey Dresser, MD;  Location: [place];  Service: Cardiovascular;  Laterality: N/A;  . TONSILLECTOMY AND ADENOIDECTOMY  1938  . UMBILICAL HERNIA REPAIR  [phone] X 3    There were no vitals filed for this visit.  Subjective Assessment - 02/22/17 0850    Subjective  The fecal incontinence has been grest for 2-3 days .  I have several good days then a bad day. I am having less bad days since I started therapy. A bad day is I leak when I do not realize it.  I leak when stand up, being active, bending over.     Patient Stated Goals  reduce fecal soiling     Currently in Pain?  No/denies                   Pelvic Floor Special Questions - 02/22/17 0001    Biofeedback  sit hold 10 sec; sit to stand and back with pelvic floor contraction     Biofeedback sensor type  Rectal Surface    Biofeedback Activity  Quick contraction;10 second hold sitting, squat, bending        [facility] Adult PT Treatment/Exercise - 02/22/17 0001      Self-Care   Self-Care  Other Self-Care Comments    Other Self-Care Comments   discussed fiber and how to see if diet affects the fecal soilage with bowel diary             PT Education - 02/22/17 0857    Education provided  Yes    Education Details  how to fill out bowel diary, Pelvic floor exercise with sit to stand and bending, and squating  Person(s) Educated  Patient    Methods  Explanation;Verbal cues;Handout    Comprehension  Verbalized understanding;Returned demonstration       PT Short Term Goals - 02/22/17 0851      PT SHORT TERM GOAL #1   Title  independent with initial HEP    Time  4    Period  Weeks    Status  Achieved      PT SHORT TERM GOAL #2   Title  able to contract pelvic floor with a lift with lower abdominal contraction due to improved pelvic floor strength and coordinatioin    Time  4    Period  Weeks    Status  Achieved      PT SHORT TERM GOAL #3   Title  fecal incontinence decreased >/= 25% due to increased strength    Time  4    Period  Weeks    Status  Achieved      PT SHORT TERM GOAL #4   Title  understand what bowel health is by adding more vegetables and fiber in his diet    Time  4    Period  Weeks    Status  Achieved        PT Long Term Goals - 02/01/17 0909      PT LONG TERM GOAL #1   Title  independent with HEP and how to progress himselft    Time  8    Period  Weeks    Status  New    Target Date  03/29/17      PT LONG TERM GOAL #2   Title  pelvic floor strength is >/= 4/5 with 10 second hold to reduce fecal incontinence >/= 75%    Time   8    Period  Weeks    Status  New    Target Date  03/29/17      PT LONG TERM GOAL #3   Title  abdominal strength >/= 3/5 with minimal to no midline abdominal protrusion due to improved quality of contraction    Time  8    Period  Weeks    Status  New    Target Date  03/29/17      PT LONG TERM GOAL #4   Title  use 1 to no pads due to reduction of fecal leakage >/= 75%    Time  8    Period  Weeks    Status  New    Target Date  03/29/17            Plan - 02/22/17 5993    Clinical Impression Statement  Patient has met STG's.  Patient has difficulty with contracting his pelvic floor muscles with activity, sit to stand and back, and squatting.  Pelvic floor resting level is 1.5 uv in sitting.  Patient is able to contract for 10 seconds in sitting from 8-10 uv.  Patient will benefit from skilled therapy to improve anal sphincter strength to reduce fecal leakage.     Rehab Potential  Excellent    Clinical Impairments Affecting Rehab Potential  Pacemaker; CHF; s/p inguinal hernia repair    PT Frequency  1x / week    PT Duration  8 weeks    PT Treatment/Interventions  Neuromuscular re-education;Therapeutic activities;Therapeutic exercise;Patient/family education;Manual techniques    PT Next Visit Plan  lower abdominal exercise; standing pelvic floor exercise    PT Home Exercise Plan  progress as needed    Consulted and  Agree with Plan of Care  Patient       Patient will benefit from skilled therapeutic intervention in order to improve the following deficits and impairments:  Decreased coordination, Decreased strength  Visit Diagnosis: Muscle weakness (generalized)  Other lack of coordination     Problem List Patient Active Problem List   Diagnosis Date Noted  . Left knee pain 10/23/2016  . Prolapsed internal hemorrhoids, grade 2 - causing fecal seepage 06/10/2016  . Atrial fibrillation with tachycardic ventricular rate ([place]) 04/20/2016  . Chronic anticoagulation  02/10/2016  . AK (actinic keratosis) 01/28/2016  . Left breast lump 07/15/2015  . Lipoma 07/08/2015  . Neuropathy 05/16/2015  . Trigger thumb of right hand 07/04/2014  . Encounter for therapeutic drug monitoring 07/02/2014  . Allergic rhinitis 02/14/2014  . Fecal incontinence 10/18/2013  . Chronic cough 08/18/2013  . Pleural effusion, right 08/16/2013  . Chronic systolic CHF (congestive heart failure) ([place]) 03/21/2013  . Recurrent right inguinal hernia 11/22/2012  . Intention tremor 11/01/2012  . Pleural effusion on right 07/06/2012  . Nonischemic cardiomyopathy ([place]) 09/20/2010  . PPM-St.Jude 11/27/2008  . CAD (coronary artery disease) 07/20/2008  . SICK SINUS SYNDROME 07/20/2008  . Polymyalgia rheumatica ([place]) 07/03/2008  . MIXED HEARING LOSS BILATERAL 01/10/2008  . Hyperlipidemia 03/16/2007  . Atrial fibrillation ([place]) 03/16/2007  . GERD 03/16/2007  . BPH (benign prostatic hyperplasia) 03/16/2007    Earlie Counts, PT 02/22/17 9:32 AM   [facility] [address] [place], [place], [address] Phone: [phone]   Fax:  [phone]  Name: STEFFAN CANIGLIA MRN: [id number] Date of Birth: 01/23/1930

## 2017-02-22 NOTE — Telephone Encounter (Signed)
Patient had seen results on MyChart and had no questions.

## 2017-02-24 ENCOUNTER — Ambulatory Visit (INDEPENDENT_AMBULATORY_CARE_PROVIDER_SITE_OTHER): Payer: Medicare Other | Admitting: *Deleted

## 2017-02-24 DIAGNOSIS — I4891 Unspecified atrial fibrillation: Secondary | ICD-10-CM | POA: Diagnosis not present

## 2017-02-24 DIAGNOSIS — Z5181 Encounter for therapeutic drug level monitoring: Secondary | ICD-10-CM | POA: Diagnosis not present

## 2017-02-24 LAB — POCT INR: INR: 2

## 2017-02-24 NOTE — Patient Instructions (Signed)
Description   Continue on same dosage 1 tablet daily except 1/2 tablet on Saturdays.  Recheck INR in 4 weeks.  Call us with any medication changes #  [phone]. Pt is taking Prednisone 15mg  daily for 10 days will finish on 02/28/2017  And will see MD to change dose of Prednisone please call with changes

## 2017-03-01 ENCOUNTER — Telehealth: Payer: Self-pay | Admitting: Family Medicine

## 2017-03-01 NOTE — Telephone Encounter (Signed)
Copied from [place]. Topic: General - Other >> Mar 01, 2017  8:49 AM Darl Householder, RMA wrote: Reason for CRM: patient called to notify Dr. Yong Channel that the prednisone is working very well for him and wants to know if he needs to come off or is Dr. Yong Channel going to change the dosage, patient is requesting a call back today if possible

## 2017-03-01 NOTE — Telephone Encounter (Signed)
See note

## 2017-03-01 NOTE — Telephone Encounter (Signed)
Spoke with patient. Drastic improvement in symptoms- some lingering. We discussed going down to 2 tablets on Tuesday and Wednesday and continuing if does well. If doesn't do well with this go back up to 3 tablets a day. He has enough even with 3 a day to make it to day of visit( would need pills for that day)

## 2017-03-01 NOTE — Telephone Encounter (Signed)
Please advise 

## 2017-03-02 ENCOUNTER — Other Ambulatory Visit: Payer: Self-pay | Admitting: Family Medicine

## 2017-03-04 ENCOUNTER — Encounter: Payer: Self-pay | Admitting: Physical Therapy

## 2017-03-04 ENCOUNTER — Ambulatory Visit: Payer: Medicare Other | Admitting: Physical Therapy

## 2017-03-04 DIAGNOSIS — M6281 Muscle weakness (generalized): Secondary | ICD-10-CM | POA: Diagnosis not present

## 2017-03-04 DIAGNOSIS — R278 Other lack of coordination: Secondary | ICD-10-CM | POA: Diagnosis not present

## 2017-03-04 NOTE — Therapy (Signed)
[facility] [address], [place], [place], [address] Phone: [phone]   Fax:  [phone]  Physical Therapy Treatment  Patient Details  Name: [name] MRN: [id number] Date of Birth: [birth year] Referring Provider: Dr. Silvano Rusk   Encounter Date: 03/04/2017  PT End of Session - 03/04/17 0853    Visit Number  4    Number of Visits  10    Date for PT Re-Evaluation  03/29/17    Authorization Type  Medicare BCBS    PT Start Time  0845    PT Stop Time  0923    PT Time Calculation (min)  38 min    Activity Tolerance  Patient tolerated treatment well    Behavior During Therapy  [facility] for tasks assessed/performed       Past Medical History:  Diagnosis Date  . Arthritis    "back" (03/20/2014)  . Atrial fibrillation ([place])   . BPH (benign prostatic hypertrophy)   . Breast mass    "on both sides" (06/28/2012)  . Cardiomyopathy primary-nonischemic EF 45%  . CHF (congestive heart failure) ([place])   . Coronary artery disease   . Diverticulosis of colon without hemorrhage 01/03/2014  . Dysphagia   . Elevated liver enzymes   . Esophageal stricture   . GERD (gastroesophageal reflux disease)   . Hearing loss, mixed, bilateral   . Hx of cardiovascular stress test    Adenosine Myoview (07/2013):  Apical cap, apical lateral and mid anterolateral scar, small amount of peri-infarct ischemia, EF 36%; Medium Risk  . Hyperlipidemia   . Increased prostate specific antigen (PSA) velocity   . Internal hemorrhoids 01/03/2014  . Lumbar back pain   . Pacemaker    st jude  . Pacemaker infection ([place])    06/28/12  . Polymyalgia rheumatica ([place])   . Sick sinus syndrome [facility])     Past Surgical History:  Procedure Laterality Date  . AV NODE ABLATION N/A 04/20/2016   Procedure: AV Node Ablation;  Surgeon: Evans Lance, MD;  Location: [place] CV LAB;  Service: Cardiovascular;  Laterality: N/A;  . BREAST SURGERY    .  CARDIAC CATHETERIZATION  1980's   [place]  . CARDIOVERSION  06/17/2011   Procedure: CARDIOVERSION;  Surgeon: Lelon Perla, MD;  Location: [place];  Service: Cardiovascular;  Laterality: N/A;  . CARDIOVERSION  09/09/2011   Procedure: CARDIOVERSION;  Surgeon: Carlena Bjornstad, MD;  Location: [place];  Service: Cardiovascular;  Laterality: N/A;  . CARDIOVERSION  01/01/2012   Procedure: CARDIOVERSION;  Surgeon: Hillary Bow, MD;  Location: [facility] ENDOSCOPY;  Service: Cardiovascular;  Laterality: N/A;  . CARDIOVERSION N/A 08/03/2014   Procedure: CARDIOVERSION;  Surgeon: Larey Dresser, MD;  Location: [facility] ENDOSCOPY;  Service: Cardiovascular;  Laterality: N/A;  . CARDIOVERSION N/A 11/07/2015   Procedure: CARDIOVERSION;  Surgeon: Larey Dresser, MD;  Location: [facility] ENDOSCOPY;  Service: Cardiovascular;  Laterality: N/A;  . CARDIOVERSION N/A 11/20/2015   Procedure: CARDIOVERSION;  Surgeon: Larey Dresser, MD;  Location: [place];  Service: Cardiovascular;  Laterality: N/A;  . CATARACT EXTRACTION, BILATERAL Bilateral 1980's  . COLONOSCOPY N/A 01/03/2014   Procedure: COLONOSCOPY;  Surgeon: Gatha Mayer, MD;  Location: [facility] ENDOSCOPY;  Service: Endoscopy;  Laterality: N/A;  . ESOPHAGOGASTRODUODENOSCOPY    . GENERATOR REMOVAL Left 06/15/2012   Procedure: GENERATOR REMOVAL;  Surgeon: Evans Lance, MD;  Location: [place];  Service: Cardiovascular;  Laterality: Left;  . HEMORRHOID BANDING    .  INGUINAL HERNIA REPAIR Right 2012; 03/20/2014  . INGUINAL HERNIA REPAIR Right 03/20/2014   Procedure: OPEN REPAIR OF RIGHT INGUINAL WITH MESH;  Surgeon: Donnie Mesa, MD;  Location: [place];  Service: General;  Laterality: Right;  . INSERT / REPLACE / REMOVE PACEMAKER    . INSERTION OF MESH Right 03/20/2014   Procedure: INSERTION OF MESH;  Surgeon: Donnie Mesa, MD;  Location: [place];  Service: General;  Laterality: Right;  . KNEE ARTHROSCOPY WITH LATERAL MENISECTOMY Left 12/16/2016   Procedure: KNEE ARTHROSCOPY WITH LATERAL  MENISECTOMY and Chondroplasty;  Surgeon: Earlie Server, MD;  Location: [place];  Service: Orthopedics;  Laterality: Left;  . LEAD REVISION N/A 06/29/2012   Procedure: LEAD REVISION;  Surgeon: Deboraha Sprang, MD;  Location: [facility] CATH LAB;  Service: Cardiovascular;  Laterality: N/A;  . PACEMAKER GENERATOR CHANGE N/A 06/15/2012   Procedure: PACEMAKER GENERATOR CHANGE;  Surgeon: Evans Lance, MD;  Location: [place];  Service: Cardiovascular;  Laterality: N/A;  . PACEMAKER REVISION  06/30/2012   Procedure: PACEMAKER LEAD REVISION;  Surgeon: Evans Lance, MD;  Location: [facility] CATH LAB;  Service: Cardiovascular;;  . PARACENTESIS  ~ 03/4780   complication from pacer change in 2014  . PERMANENT PACEMAKER INSERTION N/A 06/28/2012   Procedure: PERMANENT PACEMAKER INSERTION;  Surgeon: Evans Lance, MD;  Location: [facility] CATH LAB;  Service: Cardiovascular;  Laterality: N/A;  . TEE WITHOUT CARDIOVERSION  01/01/2012   Procedure: TRANSESOPHAGEAL ECHOCARDIOGRAM (TEE);  Surgeon: Fay Records, MD;  Location: [facility] ENDOSCOPY;  Service: Cardiovascular;  Laterality: N/A;  . TEE WITHOUT CARDIOVERSION N/A 08/03/2014   Procedure: TRANSESOPHAGEAL ECHOCARDIOGRAM (TEE);  Surgeon: Larey Dresser, MD;  Location: [place];  Service: Cardiovascular;  Laterality: N/A;  . TEE WITHOUT CARDIOVERSION N/A 11/07/2015   Procedure: TRANSESOPHAGEAL ECHOCARDIOGRAM (TEE);  Surgeon: Larey Dresser, MD;  Location: [place];  Service: Cardiovascular;  Laterality: N/A;  . TONSILLECTOMY AND ADENOIDECTOMY  1938  . UMBILICAL HERNIA REPAIR  [phone] X [phone]    There were no vitals filed for this visit.  Subjective Assessment - 03/04/17 0848    Subjective  I am pretty good. I am not having as much problems with incontinence. Patient reports he is 60% better.     Patient Stated Goals  reduce fecal soiling    Currently in Pain?  No/denies         [facility] PT Assessment - 03/04/17 0001      Strength   Right Hip ABduction  3/5    Left Hip ABduction   3+/5                  [facility] Adult PT Treatment/Exercise - 03/04/17 0001      Self-Care   Self-Care  Other Self-Care Comments    Other Self-Care Comments   dicussed dairy intake that can increase fecal soiling.       Lumbar Exercises: Supine   Ab Set  10 reps;5 seconds    Clam  10 reps each leg with core bracing, pelvic floor contraction    Heel Slides  10 reps core brace, pelvic floor contraction    Bent Knee Raise  10 reps core bracing; pelvic floor contraction    Bridge  15 reps pelvic floor contraction      Knee/Hip Exercises: Sidelying   Hip ADduction  Strengthening;Right;Left;1 set;10 reps tactile contraction for pelvic floor             PT Education - 03/04/17 6578  Education provided  Yes    Education Details  core strength    Person(s) Educated  Patient    Methods  Explanation;Demonstration;Verbal cues;Handout    Comprehension  Returned demonstration;Verbalized understanding       PT Short Term Goals - 02/22/17 0851      PT SHORT TERM GOAL #1   Title  independent with initial HEP    Time  4    Period  Weeks    Status  Achieved      PT SHORT TERM GOAL #2   Title  able to contract pelvic floor with a lift with lower abdominal contraction due to improved pelvic floor strength and coordinatioin    Time  4    Period  Weeks    Status  Achieved      PT SHORT TERM GOAL #3   Title  fecal incontinence decreased >/= 25% due to increased strength    Time  4    Period  Weeks    Status  Achieved      PT SHORT TERM GOAL #4   Title  understand what bowel health is by adding more vegetables and fiber in his diet    Time  4    Period  Weeks    Status  Achieved        PT Long Term Goals - 03/04/17 5053      PT LONG TERM GOAL #1   Title  independent with HEP and how to progress himselft    Baseline  still learning    Time  8    Period  Weeks    Status  On-going      PT LONG TERM GOAL #2   Title  pelvic floor strength is >/= 4/5 with 10  second hold to reduce fecal incontinence >/= 75%    Time  8    Period  Weeks    Status  On-going      PT LONG TERM GOAL #3   Title  abdominal strength >/= 3/5 with minimal to no midline abdominal protrusion due to improved quality of contraction    Baseline  increasing exercises for abdominal strength    Time  8    Period  Weeks    Status  On-going      PT LONG TERM GOAL #4   Title  use 1 to no pads due to reduction of fecal leakage >/= 75%    Baseline   1 day this week needed 2-3 pads    Time  8    Period  Weeks    Status  On-going            Plan - 03/04/17 9767    Clinical Impression Statement  Patient reports he is 60% better.  Bad days he wears 3-4 pads and has happened  1 time last week.  Patient had 1 day of no soiling last week.  Patient average si change his pad 1 time per day. Patient continues to have weakness in bilateral hip abductors.  Patient is able to contract core and pelvic floor with abdominal series exercise. Patient will benefit from skilled therapy to improve anal sphincter strength to reduce fecal leakage.     Rehab Potential  Excellent    Clinical Impairments Affecting Rehab Potential  Pacemaker; CHF; s/p inguinal hernia repair    PT Frequency  1x / week    PT Duration  8 weeks    PT Treatment/Interventions  Neuromuscular re-education;Therapeutic activities;Therapeutic  exercise;Patient/family education;Manual techniques    PT Next Visit Plan  standing pelvic floor exercise    PT Home Exercise Plan  progress as needed    Consulted and Agree with Plan of Care  Patient       Patient will benefit from skilled therapeutic intervention in order to improve the following deficits and impairments:  Decreased coordination, Decreased strength  Visit Diagnosis: Muscle weakness (generalized)  Other lack of coordination     Problem List Patient Active Problem List   Diagnosis Date Noted  . Left knee pain 10/23/2016  . Prolapsed internal hemorrhoids,  grade 2 - causing fecal seepage 06/10/2016  . Atrial fibrillation with tachycardic ventricular rate ([place]) 04/20/2016  . Chronic anticoagulation 02/10/2016  . AK (actinic keratosis) 01/28/2016  . Left breast lump 07/15/2015  . Lipoma 07/08/2015  . Neuropathy 05/16/2015  . Trigger thumb of right hand 07/04/2014  . Encounter for therapeutic drug monitoring 07/02/2014  . Allergic rhinitis 02/14/2014  . Fecal incontinence 10/18/2013  . Chronic cough 08/18/2013  . Pleural effusion, right 08/16/2013  . Chronic systolic CHF (congestive heart failure) ([place]) 03/21/2013  . Recurrent right inguinal hernia 11/22/2012  . Intention tremor 11/01/2012  . Pleural effusion on right 07/06/2012  . Nonischemic cardiomyopathy ([place]) 09/20/2010  . PPM-St.Jude 11/27/2008  . CAD (coronary artery disease) 07/20/2008  . SICK SINUS SYNDROME 07/20/2008  . Polymyalgia rheumatica ([place]) 07/03/2008  . MIXED HEARING LOSS BILATERAL 01/10/2008  . Hyperlipidemia 03/16/2007  . Atrial fibrillation ([place]) 03/16/2007  . GERD 03/16/2007  . BPH (benign prostatic hyperplasia) 03/16/2007    Earlie Counts, PT 03/04/17 9:24 AM   [facility] [address] [place], [place], [address] Phone: [phone]   Fax:  [phone]  Name: ORIEN MAYHALL MRN: [id number] Date of Birth: 26-Dec-1929

## 2017-03-04 NOTE — Patient Instructions (Addendum)
Abdominal Bracing With Pelvic Floor (Hook-Lying)    With neutral spine, tighten pelvic floor and abdominals. Hold 5 sec. Repeat __5_ times. Do __1_ times a day.   Copyright  VHI. All rights reserved.   Bracing With Bridging (Hook-Lying)    With neutral spine, tighten pelvic floor and abdominals and hold. Lift bottom. Repeat _15__ times. Do _1__ times a day.   Copyright  VHI. All rights reserved.  Bracing With Knee Fallout (Hook-Lying)    With neutral spine, tighten pelvic floor and abdominals and hold. Alternating legs, drop knee out to side. Keep opposite hip still. Repeat _10_ times. Do _1__ times a day.   Copyright  VHI. All rights reserved.  Bracing With Leg March (Hook-Lying)    With neutral spine, tighten pelvic floor and abdominals and hold. Alternating legs, lift foot _12__ inches and return to floor. Repeat _10__ times. Do _1__ times a day.   Copyright  VHI. All rights reserved.  Strengthening: Hip Abduction (Side-Lying)    Tighten muscles on front of left thigh, Squeeze the pelvic floor, then lift leg _6___ inches from surface, keeping knee locked.  Repeat _10___ times per set. Do __1__ sets per session. Do __1__ sessions per day.  http://orth.exer.us/622   Copyright  VHI. All rights reserved.    Step-Down / Step-Up    Stand on stair step or __4__ inch stool. Slowly bend left leg, lowering other foot to floor. Return by straightening front leg. Repeat __10__ times per set. Do __1__ sets per session. Do __1__ sessions per day.  http://orth.exer.us/684   Copyright  VHI. All rights reserved.  [facility] [address] [place], [place] [address] Phone # [phone] Fax [phone]

## 2017-03-08 ENCOUNTER — Ambulatory Visit: Payer: Medicare Other | Admitting: Family Medicine

## 2017-03-10 ENCOUNTER — Encounter: Payer: Self-pay | Admitting: Family Medicine

## 2017-03-10 ENCOUNTER — Ambulatory Visit (INDEPENDENT_AMBULATORY_CARE_PROVIDER_SITE_OTHER): Payer: Medicare Other | Admitting: Family Medicine

## 2017-03-10 VITALS — BP 96/62 | HR 70 | Temp 97.6°F | Ht 66.0 in | Wt 154.8 lb

## 2017-03-10 DIAGNOSIS — M353 Polymyalgia rheumatica: Secondary | ICD-10-CM | POA: Diagnosis not present

## 2017-03-10 MED ORDER — PREDNISONE 5 MG PO TABS: 5.0000 mg | ORAL_TABLET | Freq: Every day | ORAL | 0 refills | Status: DC

## 2017-03-10 NOTE — Patient Instructions (Addendum)
Let's try 5mg  prednisone today (just 1 tablet). If this works then great. If not- try prehaps 1.5 pills. Then recheck with me in 1 month. I am ok with you adjusting within the 1-2 pills during that frame as needed but hopefully get you down to 1 pill daily by next visit then perhaps use half a pill daily after 1 month.

## 2017-03-10 NOTE — Progress Notes (Signed)
Subjective:  [name] is a 84 y.o. year old very pleasant male patient who presents for/with See problem oriented charting ROS- some cough, congestion which is improving. Has some stiffness and pain in shoulders and upper thighs region- improving.  No fever or chills or shortness of breath.  Past Medical History-  Patient Active Problem List   Diagnosis Date Noted  . Chronic systolic CHF (congestive heart failure) ([place]) 03/21/2013    Priority: High  . Nonischemic cardiomyopathy ([place]) 09/20/2010    Priority: High  . PPM-St.Jude 11/27/2008    Priority: High  . CAD (coronary artery disease) 07/20/2008    Priority: High  . SICK SINUS SYNDROME 07/20/2008    Priority: High  . Polymyalgia rheumatica ([place]) 07/03/2008    Priority: High  . Atrial fibrillation ([place]) 03/16/2007    Priority: High  . AK (actinic keratosis) 01/28/2016    Priority: Medium  . Lipoma 07/08/2015    Priority: Medium  . Neuropathy 05/16/2015    Priority: Medium  . Chronic cough 08/18/2013    Priority: Medium  . Pleural effusion, right 08/16/2013    Priority: Medium  . Pleural effusion on right 07/06/2012    Priority: Medium  . Hyperlipidemia 03/16/2007    Priority: Medium  . BPH (benign prostatic hyperplasia) 03/16/2007    Priority: Medium  . Left breast lump 07/15/2015    Priority: Low  . Encounter for therapeutic drug monitoring 07/02/2014    Priority: Low  . Allergic rhinitis 02/14/2014    Priority: Low  . Fecal incontinence 10/18/2013    Priority: Low  . Recurrent right inguinal hernia 11/22/2012    Priority: Low  . Intention tremor 11/01/2012    Priority: Low  . MIXED HEARING LOSS BILATERAL 01/10/2008    Priority: Low  . GERD 03/16/2007    Priority: Low  . Left knee pain 10/23/2016  . Prolapsed internal hemorrhoids, grade 2 - causing fecal seepage 06/10/2016  . Atrial fibrillation with tachycardic ventricular rate ([place]) 04/20/2016  . Chronic anticoagulation 02/10/2016  . Trigger  thumb of right hand 07/04/2014    Medications- reviewed and updated Current Outpatient Medications  Medication Sig Dispense Refill  . acetaminophen (TYLENOL) 500 MG tablet Take 1,000 mg by mouth every 6 (six) hours as needed for moderate pain or headache.    . fluticasone (FLONASE) 50 MCG/ACT nasal spray Place 2 sprays into both nostrils daily as needed for allergies or rhinitis. 16 g 3  . furosemide (LASIX) 80 MG tablet Take 1 tablet (80 mg total) by mouth daily. 90 tablet 3  . hydroxypropyl methylcellulose / hypromellose (ISOPTO TEARS / GONIOVISC) 2.5 % ophthalmic solution Place 1 drop into both eyes daily as needed for dry eyes.    Marland Kitchen KLOR-CON M20 20 MEQ tablet TAKE 1 TABLET TWICE DAILY 180 tablet 2  . losartan (COZAAR) 25 MG tablet Take 0.5 tablets (12.5 mg total) by mouth daily. 90 tablet 3  . metoprolol succinate (TOPROL-XL) 25 MG 24 hr tablet Take 0.5 tablets (12.5 mg total) by mouth at bedtime. 15 tablet 3  . pravastatin (PRAVACHOL) 80 MG tablet Take 0.5 tablets (40 mg total) by mouth daily. 45 tablet 3  . predniSONE (DELTASONE) 5 MG tablet Take 1 tablet (5 mg total) by mouth daily with breakfast. 60 tablet 0  . spironolactone (ALDACTONE) 25 MG tablet Take 0.5 tablets (12.5 mg total) by mouth every evening. 45 tablet 3  . tamsulosin (FLOMAX) 0.4 MG CAPS capsule Take 1 capsule (0.4 mg total) by mouth  daily. 90 capsule 3  . warfarin (COUMADIN) 5 MG tablet TAKE 1/2 TO 1 TABLET (2.5 TO 5 MG TOTAL) DAILY AS DIRECTED BY ANTICOAGULATION CLINIC 90 tablet 3   Objective: BP 96/62 (BP Location: Left Arm, Patient Position: Sitting, Cuff Size: Normal)   Pulse 70   Temp 97.6 F (36.4 C) (Oral)   Ht '5\' 6"'  (1.676 m)   Wt 154 lb 12.8 oz (70.2 kg)   SpO2 98%   BMI 24.99 kg/m  Gen: NAD, resting comfortably CV: RRR no murmurs rubs or gallops Lungs: CTAB no crackles, wheeze, rhonchi Ext: no edema Skin: warm, dry MSK: with movement of shoulders and pelvic girdle reports some mild pain. Does not  appear stiff  Assessment/Plan:  Improving from URI- 1 week in- he is mainly concerned due to his wife- no way we can reliably prevent her from getting his symptoms (she is sicker at baseline) other than good hand hygiene and avoiding exchanging saliva (advised no kissing) Patient Instructions  Let's try 59m prednisone today (just 1 tablet). If this works then great. If not- try prehaps 1.5 pills. Then recheck with me in 1 month. I am ok with you adjusting within the 1-2 pills during that frame as needed but hopefully get you down to 1 pill daily by next visit then perhaps use half a pill daily after 1 month.    Polymyalgia rheumatica (HCC) S: Patient presented with proximal muscle weakness  And stiffness about 2-3 weeks ago. History of PMR thought very likely back in 2010 though ESR and CRp not elevated. Similar occurrence at present. We did a course of prednisone at 157mfor several weeks then had him go down to 1029mon 03/02/17) since he had drastic improvement in symptoms though some were lingering.  He states he is at least 75% better and wants to go down on dose A/P: we will trial 5 mg (7.5mg81m needed) over next month then see if we can go down to 2.5 mg next month. I am not 100% confident in PMR diagnosis but he did make brisk improvement on prednisone- we will continue to taper and try to get him off within 3 months if possible.    Future Appointments  Date Time Provider DepaAgenda7/2019  8:45 AM GrayMonico Hoar [facility]  03/23/2017  8:00 AM GrayMonico Hoar [facility]  03/24/2017  7:30 AM [facility]  04/12/2017  9:30 AM HuntYong ChannelpBrayton Mars [facility]   Meds ordered this encounter  Medications  . predniSONE (DELTASONE) 5 MG tablet    Sig: Take 1-2 tablets (5-10 mg total) by mouth daily with breakfast.    Dispense:  60 tablet    Refill:  0   Return precautions advised.  StepGarret Reddish

## 2017-03-10 NOTE — Assessment & Plan Note (Signed)
S: Patient presented with proximal muscle weakness  And stiffness about 2-3 weeks ago. History of PMR thought very likely back in 2010 though ESR and CRp not elevated. Similar occurrence at present. We did a course of prednisone at 68m for several weeks then had him go down to 153m(on 03/02/17) since he had drastic improvement in symptoms though some were lingering.  He states he is at least 75% better and wants to go down on dose A/P: we will trial 5 mg (7.8m28mf needed) over next month then see if we can go down to 2.5 mg next month. I am not 100% confident in PMR diagnosis but he did make brisk improvement on prednisone- we will continue to taper and try to get him off within 3 months if possible.

## 2017-03-15 ENCOUNTER — Encounter: Payer: Self-pay | Admitting: Physical Therapy

## 2017-03-15 ENCOUNTER — Ambulatory Visit: Payer: Medicare Other | Attending: Internal Medicine | Admitting: Physical Therapy

## 2017-03-15 DIAGNOSIS — M6281 Muscle weakness (generalized): Secondary | ICD-10-CM | POA: Insufficient documentation

## 2017-03-15 DIAGNOSIS — R278 Other lack of coordination: Secondary | ICD-10-CM

## 2017-03-15 NOTE — Therapy (Signed)
[facility] [address] [place], [place], [address] Phone: [phone]   Fax:  [phone]  Physical Therapy Treatment  Patient Details  Name: [name] MRN: [id number] Date of Birth: Jun 05, 1929 Referring Provider: Dr. Silvano Rusk   Encounter Date: 03/15/2017  PT End of Session - 03/15/17 0851    Visit Number  5    Number of Visits  10    Date for PT Re-Evaluation  03/29/17    Authorization Type  Medicare BCBS    PT Start Time  [phone]    PT Stop Time  0911 treat was short due to not needing the extra time    PT Time Calculation (min)  25 min    Activity Tolerance  Patient tolerated treatment well    Behavior During Therapy  [facility] for tasks assessed/performed       Past Medical History:  Diagnosis Date  . Arthritis    "back" (03/20/2014)  . Atrial fibrillation ([place])   . BPH (benign prostatic hypertrophy)   . Breast mass    "on both sides" (06/28/2012)  . Cardiomyopathy primary-nonischemic EF 45%  . CHF (congestive heart failure) ([place])   . Coronary artery disease   . Diverticulosis of colon without hemorrhage 01/03/2014  . Dysphagia   . Elevated liver enzymes   . Esophageal stricture   . GERD (gastroesophageal reflux disease)   . Hearing loss, mixed, bilateral   . Hx of cardiovascular stress test    Adenosine Myoview (07/2013):  Apical cap, apical lateral and mid anterolateral scar, small amount of peri-infarct ischemia, EF 36%; Medium Risk  . Hyperlipidemia   . Increased prostate specific antigen (PSA) velocity   . Internal hemorrhoids 01/03/2014  . Lumbar back pain   . Pacemaker    st jude  . Pacemaker infection ([place])    06/28/12  . Polymyalgia rheumatica ([place])   . Sick sinus syndrome [facility])     Past Surgical History:  Procedure Laterality Date  . AV NODE ABLATION N/A 04/20/2016   Procedure: AV Node Ablation;  Surgeon: Evans Lance, MD;  Location: [place] CV [facility];  Service: Cardiovascular;   Laterality: N/A;  . BREAST SURGERY    . CARDIAC CATHETERIZATION  1980's   [place]  . CARDIOVERSION  06/17/2011   Procedure: CARDIOVERSION;  Surgeon: Lelon Perla, MD;  Location: [place];  Service: Cardiovascular;  Laterality: N/A;  . CARDIOVERSION  09/09/2011   Procedure: CARDIOVERSION;  Surgeon: Carlena Bjornstad, MD;  Location: [place];  Service: Cardiovascular;  Laterality: N/A;  . CARDIOVERSION  01/01/2012   Procedure: CARDIOVERSION;  Surgeon: Hillary Bow, MD;  Location: [facility] ENDOSCOPY;  Service: Cardiovascular;  Laterality: N/A;  . CARDIOVERSION N/A 08/03/2014   Procedure: CARDIOVERSION;  Surgeon: Larey Dresser, MD;  Location: [facility] ENDOSCOPY;  Service: Cardiovascular;  Laterality: N/A;  . CARDIOVERSION N/A 11/07/2015   Procedure: CARDIOVERSION;  Surgeon: Larey Dresser, MD;  Location: [facility] ENDOSCOPY;  Service: Cardiovascular;  Laterality: N/A;  . CARDIOVERSION N/A 11/20/2015   Procedure: CARDIOVERSION;  Surgeon: Larey Dresser, MD;  Location: [place];  Service: Cardiovascular;  Laterality: N/A;  . CATARACT EXTRACTION, BILATERAL Bilateral 1980's  . COLONOSCOPY N/A 01/03/2014   Procedure: COLONOSCOPY;  Surgeon: Gatha Mayer, MD;  Location: [facility] ENDOSCOPY;  Service: Endoscopy;  Laterality: N/A;  . ESOPHAGOGASTRODUODENOSCOPY    . GENERATOR REMOVAL Left 06/15/2012   Procedure: GENERATOR REMOVAL;  Surgeon: Evans Lance, MD;  Location: [place];  Service:  Cardiovascular;  Laterality: Left;  . HEMORRHOID BANDING    . INGUINAL HERNIA REPAIR Right 2012; 03/20/2014  . INGUINAL HERNIA REPAIR Right 03/20/2014   Procedure: OPEN REPAIR OF RIGHT INGUINAL WITH MESH;  Surgeon: Donnie Mesa, MD;  Location: [place];  Service: General;  Laterality: Right;  . INSERT / REPLACE / REMOVE PACEMAKER    . INSERTION OF MESH Right 03/20/2014   Procedure: INSERTION OF MESH;  Surgeon: Donnie Mesa, MD;  Location: [place];  Service: General;  Laterality: Right;  . KNEE ARTHROSCOPY WITH LATERAL MENISECTOMY Left 12/16/2016    Procedure: KNEE ARTHROSCOPY WITH LATERAL MENISECTOMY and Chondroplasty;  Surgeon: Earlie Server, MD;  Location: [place];  Service: Orthopedics;  Laterality: Left;  . LEAD REVISION N/A 06/29/2012   Procedure: LEAD REVISION;  Surgeon: Deboraha Sprang, MD;  Location: [facility] CATH LAB;  Service: Cardiovascular;  Laterality: N/A;  . PACEMAKER GENERATOR CHANGE N/A 06/15/2012   Procedure: PACEMAKER GENERATOR CHANGE;  Surgeon: Evans Lance, MD;  Location: [place];  Service: Cardiovascular;  Laterality: N/A;  . PACEMAKER REVISION  06/30/2012   Procedure: PACEMAKER LEAD REVISION;  Surgeon: Evans Lance, MD;  Location: [facility] CATH LAB;  Service: Cardiovascular;;  . PARACENTESIS  ~ 11/2117   complication from pacer change in 2014  . PERMANENT PACEMAKER INSERTION N/A 06/28/2012   Procedure: PERMANENT PACEMAKER INSERTION;  Surgeon: Evans Lance, MD;  Location: [facility] CATH LAB;  Service: Cardiovascular;  Laterality: N/A;  . TEE WITHOUT CARDIOVERSION  01/01/2012   Procedure: TRANSESOPHAGEAL ECHOCARDIOGRAM (TEE);  Surgeon: Fay Records, MD;  Location: [facility] ENDOSCOPY;  Service: Cardiovascular;  Laterality: N/A;  . TEE WITHOUT CARDIOVERSION N/A 08/03/2014   Procedure: TRANSESOPHAGEAL ECHOCARDIOGRAM (TEE);  Surgeon: Larey Dresser, MD;  Location: [place];  Service: Cardiovascular;  Laterality: N/A;  . TEE WITHOUT CARDIOVERSION N/A 11/07/2015   Procedure: TRANSESOPHAGEAL ECHOCARDIOGRAM (TEE);  Surgeon: Larey Dresser, MD;  Location: [place];  Service: Cardiovascular;  Laterality: N/A;  . TONSILLECTOMY AND ADENOIDECTOMY  1938  . UMBILICAL HERNIA REPAIR  [phone]    There were no vitals filed for this visit.  Subjective Assessment - 03/15/17 0850    Subjective  I feel better.  I still have some.  I have not messed up pads for 3-4 days.  I have had some huge bowel movements. I feel the fecal soilage is 70% better.     Patient Stated Goals  reduce fecal soiling    Currently in Pain?  No/denies                       [facility] Adult PT Treatment/Exercise - 03/15/17 0001      Lumbar Exercises: Supine   Clam  10 reps each leg with core bracing, pelvic floor contraction    Heel Slides  10 reps core brace, pelvic floor contraction    Bent Knee Raise  10 reps core bracing; pelvic floor contraction    Bridge  15 reps pelvic floor contraction             PT Education - 03/15/17 0914    Education provided  Yes    Education Details  pelvic floor contraction with sit to stand, sneeze, cough    Person(s) Educated  Patient    Methods  Explanation;Demonstration;Verbal cues;Handout    Comprehension  Verbalized understanding;Returned demonstration       PT Short Term Goals - 02/22/17 0851      PT SHORT TERM GOAL #1  Title  independent with initial HEP    Time  4    Period  Weeks    Status  Achieved      PT SHORT TERM GOAL #2   Title  able to contract pelvic floor with a lift with lower abdominal contraction due to improved pelvic floor strength and coordinatioin    Time  4    Period  Weeks    Status  Achieved      PT SHORT TERM GOAL #3   Title  fecal incontinence decreased >/= 25% due to increased strength    Time  4    Period  Weeks    Status  Achieved      PT SHORT TERM GOAL #4   Title  understand what bowel health is by adding more vegetables and fiber in his diet    Time  4    Period  Weeks    Status  Achieved        PT Long Term Goals - 03/15/17 4854      PT LONG TERM GOAL #1   Title  independent with HEP and how to progress himselft    Baseline  still learning    Time  8    Period  Weeks    Status  On-going      PT LONG TERM GOAL #2   Title  pelvic floor strength is >/= 4/5 with 10 second hold to reduce fecal incontinence >/= 75%    Baseline  70% better    Time  8    Period  Weeks    Status  On-going      PT LONG TERM GOAL #3   Title  abdominal strength >/= 3/5 with minimal to no midline abdominal protrusion due to improved quality of  contraction    Baseline  increasing exercises for abdominal strength    Time  8    Period  Weeks    Status  On-going      PT LONG TERM GOAL #4   Title  use 1 to no pads due to reduction of fecal leakage >/= 75%    Baseline   past few days 1 per day for pads    Time  8    Period  Weeks    Status  On-going            Plan - 03/15/17 0902    Clinical Impression Statement  Patient is able to go from sit to stand with greater ease when his hands are in the crease of his hips. Patient reports fecal leakage is 70% better.  Patient has increased abdominal control with stabilization exercises.  Patient uses 1 pad per day and has not had soilage in the past 4 days. Patient will benefit from skilled therapy to improve anal sphinter strength to reduce fecal leakage.     Clinical Impairments Affecting Rehab Potential  Pacemaker; CHF; s/p inguinal hernia repair    PT Frequency  1x / week    PT Duration  8 weeks    PT Treatment/Interventions  Neuromuscular re-education;Therapeutic activities;Therapeutic exercise;Patient/family education;Manual techniques    PT Next Visit Plan  standing pelvic floor exercise; if no leakage then discharge    PT Home Exercise Plan  progress as needed    Consulted and Agree with Plan of Care  Patient       Patient will benefit from skilled therapeutic intervention in order to improve the following deficits and impairments:  Decreased coordination,  Decreased strength  Visit Diagnosis: Muscle weakness (generalized)  Other lack of coordination     Problem List Patient Active Problem List   Diagnosis Date Noted  . Left knee pain 10/23/2016  . Prolapsed internal hemorrhoids, grade 2 - causing fecal seepage 06/10/2016  . Atrial fibrillation with tachycardic ventricular rate ([place]) 04/20/2016  . Chronic anticoagulation 02/10/2016  . AK (actinic keratosis) 01/28/2016  . Left breast lump 07/15/2015  . Lipoma 07/08/2015  . Neuropathy 05/16/2015  . Trigger  thumb of right hand 07/04/2014  . Encounter for therapeutic drug monitoring 07/02/2014  . Allergic rhinitis 02/14/2014  . Fecal incontinence 10/18/2013  . Chronic cough 08/18/2013  . Pleural effusion, right 08/16/2013  . Chronic systolic CHF (congestive heart failure) ([place]) 03/21/2013  . Recurrent right inguinal hernia 11/22/2012  . Intention tremor 11/01/2012  . Pleural effusion on right 07/06/2012  . Nonischemic cardiomyopathy ([place]) 09/20/2010  . PPM-St.Jude 11/27/2008  . CAD (coronary artery disease) 07/20/2008  . SICK SINUS SYNDROME 07/20/2008  . Polymyalgia rheumatica ([place]) 07/03/2008  . MIXED HEARING LOSS BILATERAL 01/10/2008  . Hyperlipidemia 03/16/2007  . Atrial fibrillation ([place]) 03/16/2007  . GERD 03/16/2007  . BPH (benign prostatic hyperplasia) 03/16/2007    Earlie Counts, PT 03/15/17 9:20 AM   [facility] [address] [place], [place], [address] Phone: [phone]   Fax:  [phone]  Name: [name] MRN: [id number] Date of Birth: [birth year]

## 2017-03-15 NOTE — Patient Instructions (Addendum)
Sit to Stand: Phase 3    Sitting, squeeze pelvic floor and hold. Lean trunk forward. Push up on arms and stand up. Breath out. Relax. When sit down breath out, contract the anus. Do whenever you get in and out of the chair.  Repeat __5_ times. Do _1__ times a day. Places had in the crease.  Copyright  VHI. All rights reserved.    Cough: Phase 4 (Sitting)    Squeeze pelvic floor and hold. Inhale. Lean shoulders forward. Cough repeatedly. Relax. Do when you cough.  Copyright  VHI. All rights reserved.   Sneeze: Phase 4 (Sitting)    Squeeze pelvic floor and hold. Inhale. Lean shoulders forward. Pretend to sneeze repeatedly. Relax. When you sneeze Copyright  VHI. All rights reserved.   Slow Contraction: Gravity Resisted (Standing)    Standing, slowly squeeze pelvic floor for _5__ seconds. Rest for __5_ seconds. Do not hold breath.  Do not bring chest up Repeat _5__ times. Do _2-3__ times a day.  Copyright  VHI. All rights reserved.   [place] [address] [place], [place] [address] Phone # [phone] Fax [phone]

## 2017-03-23 ENCOUNTER — Ambulatory Visit (INDEPENDENT_AMBULATORY_CARE_PROVIDER_SITE_OTHER): Payer: Medicare Other

## 2017-03-23 ENCOUNTER — Ambulatory Visit: Payer: Medicare Other | Admitting: Physical Therapy

## 2017-03-23 ENCOUNTER — Encounter: Payer: Self-pay | Admitting: Physical Therapy

## 2017-03-23 DIAGNOSIS — M6281 Muscle weakness (generalized): Secondary | ICD-10-CM | POA: Diagnosis not present

## 2017-03-23 DIAGNOSIS — Z5181 Encounter for therapeutic drug level monitoring: Secondary | ICD-10-CM | POA: Diagnosis not present

## 2017-03-23 DIAGNOSIS — R278 Other lack of coordination: Secondary | ICD-10-CM | POA: Diagnosis not present

## 2017-03-23 DIAGNOSIS — I4891 Unspecified atrial fibrillation: Secondary | ICD-10-CM | POA: Diagnosis not present

## 2017-03-23 LAB — POCT INR: INR: 1.9

## 2017-03-23 NOTE — Therapy (Signed)
[facility] [address] [place], [place], [address] Phone: [phone]   Fax:  [phone]  Physical Therapy Treatment  Patient Details  Name: [name] MRN: [id number] Date of Birth: [birth year] Referring Provider: Dr. Silvano Rusk   Encounter Date: 03/23/2017  PT End of Session - 03/23/17 0808    Visit Number  6    Number of Visits  10    Date for PT Re-Evaluation  03/29/17    Authorization Type  Medicare BCBS    PT Start Time  0800    PT Stop Time  0838    PT Time Calculation (min)  38 min    Activity Tolerance  Patient tolerated treatment well    Behavior During Therapy  [facility] for tasks assessed/performed       Past Medical History:  Diagnosis Date  . Arthritis    "back" (03/20/2014)  . Atrial fibrillation ([place])   . BPH (benign prostatic hypertrophy)   . Breast mass    "on both sides" (06/28/2012)  . Cardiomyopathy primary-nonischemic EF 45%  . CHF (congestive heart failure) ([place])   . Coronary artery disease   . Diverticulosis of colon without hemorrhage 01/03/2014  . Dysphagia   . Elevated liver enzymes   . Esophageal stricture   . GERD (gastroesophageal reflux disease)   . Hearing loss, mixed, bilateral   . Hx of cardiovascular stress test    Adenosine Myoview (07/2013):  Apical cap, apical lateral and mid anterolateral scar, small amount of peri-infarct ischemia, EF 36%; Medium Risk  . Hyperlipidemia   . Increased prostate specific antigen (PSA) velocity   . Internal hemorrhoids 01/03/2014  . Lumbar back pain   . Pacemaker    st jude  . Pacemaker infection ([place])    06/28/12  . Polymyalgia rheumatica ([place])   . Sick sinus syndrome [facility])     Past Surgical History:  Procedure Laterality Date  . AV NODE ABLATION N/A 04/20/2016   Procedure: AV Node Ablation;  Surgeon: Evans Lance, MD;  Location: [place] [facility] LAB;  Service: Cardiovascular;  Laterality: N/A;  . BREAST SURGERY    .  CARDIAC CATHETERIZATION  1980's   [place]  . CARDIOVERSION  06/17/2011   Procedure: CARDIOVERSION;  Surgeon: Lelon Perla, MD;  Location: [place];  Service: Cardiovascular;  Laterality: N/A;  . CARDIOVERSION  09/09/2011   Procedure: CARDIOVERSION;  Surgeon: Carlena Bjornstad, MD;  Location: [place];  Service: Cardiovascular;  Laterality: N/A;  . CARDIOVERSION  01/01/2012   Procedure: CARDIOVERSION;  Surgeon: Hillary Bow, MD;  Location: [facility] ENDOSCOPY;  Service: Cardiovascular;  Laterality: N/A;  . CARDIOVERSION N/A 08/03/2014   Procedure: CARDIOVERSION;  Surgeon: Larey Dresser, MD;  Location: [facility] ENDOSCOPY;  Service: Cardiovascular;  Laterality: N/A;  . CARDIOVERSION N/A 11/07/2015   Procedure: CARDIOVERSION;  Surgeon: Larey Dresser, MD;  Location: [facility] ENDOSCOPY;  Service: Cardiovascular;  Laterality: N/A;  . CARDIOVERSION N/A 11/20/2015   Procedure: CARDIOVERSION;  Surgeon: Larey Dresser, MD;  Location: [place];  Service: Cardiovascular;  Laterality: N/A;  . CATARACT EXTRACTION, BILATERAL Bilateral 1980's  . COLONOSCOPY N/A 01/03/2014   Procedure: COLONOSCOPY;  Surgeon: Gatha Mayer, MD;  Location: [facility] ENDOSCOPY;  Service: Endoscopy;  Laterality: N/A;  . ESOPHAGOGASTRODUODENOSCOPY    . GENERATOR REMOVAL Left 06/15/2012   Procedure: GENERATOR REMOVAL;  Surgeon: Evans Lance, MD;  Location: [place];  Service: Cardiovascular;  Laterality: Left;  . HEMORRHOID BANDING    .  INGUINAL HERNIA REPAIR Right 2012; 03/20/2014  . INGUINAL HERNIA REPAIR Right 03/20/2014   Procedure: OPEN REPAIR OF RIGHT INGUINAL WITH MESH;  Surgeon: Donnie Mesa, MD;  Location: [place];  Service: General;  Laterality: Right;  . INSERT / REPLACE / REMOVE PACEMAKER    . INSERTION OF MESH Right 03/20/2014   Procedure: INSERTION OF MESH;  Surgeon: Donnie Mesa, MD;  Location: [place];  Service: General;  Laterality: Right;  . KNEE ARTHROSCOPY WITH LATERAL MENISECTOMY Left 12/16/2016   Procedure: KNEE ARTHROSCOPY WITH LATERAL  MENISECTOMY and Chondroplasty;  Surgeon: Earlie Server, MD;  Location: [place];  Service: Orthopedics;  Laterality: Left;  . LEAD REVISION N/A 06/29/2012   Procedure: LEAD REVISION;  Surgeon: Deboraha Sprang, MD;  Location: [facility] CATH LAB;  Service: Cardiovascular;  Laterality: N/A;  . PACEMAKER GENERATOR CHANGE N/A 06/15/2012   Procedure: PACEMAKER GENERATOR CHANGE;  Surgeon: Evans Lance, MD;  Location: [facility];  Service: Cardiovascular;  Laterality: N/A;  . PACEMAKER REVISION  06/30/2012   Procedure: PACEMAKER LEAD REVISION;  Surgeon: Evans Lance, MD;  Location: [facility] CATH LAB;  Service: Cardiovascular;;  . PARACENTESIS  ~ 08/9448   complication from pacer change in 2014  . PERMANENT PACEMAKER INSERTION N/A 06/28/2012   Procedure: PERMANENT PACEMAKER INSERTION;  Surgeon: Evans Lance, MD;  Location: [facility] CATH LAB;  Service: Cardiovascular;  Laterality: N/A;  . TEE WITHOUT CARDIOVERSION  01/01/2012   Procedure: TRANSESOPHAGEAL ECHOCARDIOGRAM (TEE);  Surgeon: Fay Records, MD;  Location: [facility] ENDOSCOPY;  Service: Cardiovascular;  Laterality: N/A;  . TEE WITHOUT CARDIOVERSION N/A 08/03/2014   Procedure: TRANSESOPHAGEAL ECHOCARDIOGRAM (TEE);  Surgeon: Larey Dresser, MD;  Location: [place];  Service: Cardiovascular;  Laterality: N/A;  . TEE WITHOUT CARDIOVERSION N/A 11/07/2015   Procedure: TRANSESOPHAGEAL ECHOCARDIOGRAM (TEE);  Surgeon: Larey Dresser, MD;  Location: [place];  Service: Cardiovascular;  Laterality: N/A;  . TONSILLECTOMY AND ADENOIDECTOMY  1938  . UMBILICAL HERNIA REPAIR  [phone] X 3    There were no vitals filed for this visit.  Subjective Assessment - 03/23/17 0804    Subjective  I leak a little bit but not something that is a problem.  I am using less pads and using one per day. One day I did not have to change the pad. I am 85% better.     Patient Stated Goals  reduce fecal soiling    Currently in Pain?  No/denies         [facility] PT Assessment - 03/23/17 0001       Assessment   Medical Diagnosis  R15.1 fecal smearing; R15.0 Inconmplete defecation    Referring Provider  Dr. 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[place]  exercise at the gym       Cognition   Overall Cognitive Status  Within [place] for tasks assessed      Observation/Other Assessments   Focus on Therapeutic Outcomes (FOTO)   therapist  discretion limited by 15% due to fecal soiling      Posture/Postural Control   Posture/Postural Control  No significant limitations      Strength   Overall Strength Comments  abdominal strength is 3/5 with weakness in midline of abdominals    Right Hip Extension  5/5    Right Hip ABduction  4+/5    Left Hip Extension  5/5    Left Hip External Rotation  5/5    Left Hip ABduction  4+/5      Transfers   Transfers  Not assessed      Ambulation/Gait   Ambulation/Gait  Yes               Pelvic Floor Special Questions - 03/23/17 0001    Biofeedback  squat, squat to floor and back to standing    Biofeedback sensor type  Rectal Surface    Biofeedback Activity  Quick contraction;10 second hold                PT Education - 03/23/17 0837    Education provided  Yes    Education Details  wall squats with pelvic floor contraction; stand to floor and back with pelvic floor contraction and not holding his breath    Person(s) Educated  Patient    Methods  Explanation;Demonstration;Verbal cues;Handout    Comprehension  Returned demonstration;Verbalized understanding       PT Short Term Goals - 02/22/17 0851       PT SHORT TERM GOAL #1   Title  independent with initial HEP    Time  4    Period  Weeks    Status  Achieved      PT SHORT TERM GOAL #2   Title  able to contract pelvic floor with a lift with lower abdominal contraction due to improved pelvic floor strength and coordinatioin    Time  4    Period  Weeks    Status  Achieved      PT SHORT TERM GOAL #3   Title  fecal incontinence decreased >/= 25% due to increased strength    Time  4    Period  Weeks    Status  Achieved      PT SHORT TERM GOAL #4   Title  understand what bowel health is by adding more vegetables and fiber in his diet    Time  4    Period  Weeks    Status  Achieved        PT Long Term Goals - 03/23/17 0805      PT LONG TERM GOAL #1   Title  independent with HEP and how to progress himselft    Baseline  still learning    Time  8    Period  Weeks    Status  On-going      PT LONG TERM GOAL #2   Title  pelvic floor strength is >/= 4/5 with 10 second hold to reduce fecal incontinence >/= 75%    Time  8    Period  Weeks    Status  Achieved      PT LONG TERM GOAL #3   Title  abdominal strength >/= 3/5 with minimal to no midline abdominal protrusion due to improved quality of contraction    Time  8    Period  Weeks    Status  Achieved      PT LONG TERM GOAL #4   Title  use 1  to no pads due to reduction of fecal leakage >/= 75%    Time  8    Period  Weeks    Status  Achieved            Plan - 03/23/17 0813    Clinical Impression Statement  Patient has met his goals. Patient reports his fecal incontinence is 85% better and feels good about being discharged to a HEP.  Patient had the most difficulty with standing to go to the floor and back.  Patient understands to not hold his breath with pelvic floor contraction. Abdominal strength has increased to 3/5.  Bilateral hip strength has increased to 4+/5-5/5.  Patient will wear 1 pad per day but many days is not soiled.  pelvic floor resting tone is 1.5-2  uv.  Sit with 10 second contraction to 15-20 uv with good plateay.  Stand with 10 second contraction is 20-30 uv.  Stand to squat to floor and back with pelvic floor contraction 20UV.  Patient is ready for discharge.     Rehab Potential  Excellent    Clinical Impairments Affecting Rehab Potential  Pacemaker; CHF; s/p inguinal hernia repair    PT Treatment/Interventions  Neuromuscular re-education;Therapeutic activities;Therapeutic exercise;Patient/family education;Manual techniques    PT Next Visit Plan  discharge to HEP today    PT Home Exercise Plan  current HEP    Consulted and Agree with Plan of Care  Patient       Patient will benefit from skilled therapeutic intervention in order to improve the following deficits and impairments:  Decreased coordination, Decreased strength  Visit Diagnosis: Muscle weakness (generalized)  Other lack of coordination     Problem List Patient Active Problem List   Diagnosis Date Noted  . Left knee pain 10/23/2016  . Prolapsed internal hemorrhoids, grade 2 - causing fecal seepage 06/10/2016  . Atrial fibrillation with tachycardic ventricular rate ([place]) 04/20/2016  . Chronic anticoagulation 02/10/2016  . AK (actinic keratosis) 01/28/2016  . Left breast lump 07/15/2015  . Lipoma 07/08/2015  . Neuropathy 05/16/2015  . Trigger thumb of right hand 07/04/2014  . Encounter for therapeutic drug monitoring 07/02/2014  . Allergic rhinitis 02/14/2014  . Fecal incontinence 10/18/2013  . Chronic cough 08/18/2013  . Pleural effusion, right 08/16/2013  . Chronic systolic CHF (congestive heart failure) ([place]) 03/21/2013  . Recurrent right inguinal hernia 11/22/2012  . Intention tremor 11/01/2012  . Pleural effusion on right 07/06/2012  . Nonischemic cardiomyopathy ([place]) 09/20/2010  . PPM-St.Jude 11/27/2008  . CAD (coronary artery disease) 07/20/2008  . SICK SINUS SYNDROME 07/20/2008  . Polymyalgia rheumatica ([place]) 07/03/2008  . MIXED HEARING LOSS  BILATERAL 01/10/2008  . Hyperlipidemia 03/16/2007  . Atrial fibrillation ([place]) 03/16/2007  . GERD 03/16/2007  . BPH (benign prostatic hyperplasia) 03/16/2007    Earlie Counts, PT 03/23/17 8:45 AM   [facility] [address] [place], [place], [address] Phone: [phone]   Fax:  [phone]  Name: ODEAN FESTER MRN: [id number] Date of Birth: June 09, 1929 PHYSICAL THERAPY DISCHARGE SUMMARY  Visits from Start of Care: 6  Current functional level related to goals / functional outcomes: See above.    Remaining deficits: See above   Education / Equipment: HEP Plan: Patient agrees to discharge.  Patient goals were met. Patient is being discharged due to meeting the stated rehab goals. thank you for the referral. Earlie Counts, PT 03/23/17 8:45 AM   ?????

## 2017-03-23 NOTE — Patient Instructions (Signed)
Description   Take 1.5 tablets today, then resume same dosage 1 tablet daily except 1/2 tablet on Saturdays.  Recheck INR in 4 weeks.  Call us with any medication changes #  [phone]. Pt is currently on Prednisone 5mg  daily x past 2 weeks, tapered down to this dosage.

## 2017-03-23 NOTE — Patient Instructions (Signed)
Wall Squat    Feet shoulder width apart, __12__ inches in front of wall, lean against wall. Heels on floor, knees parallel, bend hips and knees to almost 45 degrees. Contract pelvic floor. Hold __5__ seconds.  Repeat __5__ times. Do _1___ sessions per day.  Copyright  VHI. All rights reserved.  [facility] [address] [place], [place] [address] Phone # [phone] Fax [phone]

## 2017-03-31 ENCOUNTER — Encounter: Payer: Self-pay | Admitting: Family Medicine

## 2017-03-31 ENCOUNTER — Ambulatory Visit (INDEPENDENT_AMBULATORY_CARE_PROVIDER_SITE_OTHER): Payer: Medicare Other | Admitting: Family Medicine

## 2017-03-31 VITALS — BP 108/60 | HR 71 | Ht 66.0 in | Wt 159.0 lb

## 2017-03-31 DIAGNOSIS — M7918 Myalgia, other site: Secondary | ICD-10-CM

## 2017-03-31 NOTE — Patient Instructions (Signed)
I am concerned you may have a torn muscle in your buttocks or a hematoma. I think the right move is to have you see Dr. Paulla Fore to consider ultrasound - I have placed a referral and I want you to schedule with him tomorrow morning - should have an 8 20 slot left.

## 2017-03-31 NOTE — Progress Notes (Signed)
Subjective:  [name] is a 84 y.o. year old very pleasant male patient who presents for/with See problem oriented charting ROS-no fever or chills.  No expanding redness on the buttocks.  Does complain of right buttocks pain.  Past Medical History-  Patient Active Problem List   Diagnosis Date Noted  . Chronic systolic CHF (congestive heart failure) ([place]) 03/21/2013    Priority: High  . Nonischemic cardiomyopathy ([place]) 09/20/2010    Priority: High  . PPM-St.Jude 11/27/2008    Priority: High  . CAD (coronary artery disease) 07/20/2008    Priority: High  . SICK SINUS SYNDROME 07/20/2008    Priority: High  . Polymyalgia rheumatica ([place]) 07/03/2008    Priority: High  . Atrial fibrillation ([place]) 03/16/2007    Priority: High  . AK (actinic keratosis) 01/28/2016    Priority: Medium  . Lipoma 07/08/2015    Priority: Medium  . Neuropathy 05/16/2015    Priority: Medium  . Chronic cough 08/18/2013    Priority: Medium  . Pleural effusion, right 08/16/2013    Priority: Medium  . Pleural effusion on right 07/06/2012    Priority: Medium  . Hyperlipidemia 03/16/2007    Priority: Medium  . BPH (benign prostatic hyperplasia) 03/16/2007    Priority: Medium  . Left breast lump 07/15/2015    Priority: Low  . Encounter for therapeutic drug monitoring 07/02/2014    Priority: Low  . Allergic rhinitis 02/14/2014    Priority: Low  . Fecal incontinence 10/18/2013    Priority: Low  . Recurrent right inguinal hernia 11/22/2012    Priority: Low  . Intention tremor 11/01/2012    Priority: Low  . MIXED HEARING LOSS BILATERAL 01/10/2008    Priority: Low  . GERD 03/16/2007    Priority: Low  . Left knee pain 10/23/2016  . Prolapsed internal hemorrhoids, grade 2 - causing fecal seepage 06/10/2016  . Atrial fibrillation with tachycardic ventricular rate ([place]) 04/20/2016  . Chronic anticoagulation 02/10/2016  . Trigger thumb of right hand 07/04/2014    Medications- reviewed and  updated Current Outpatient Medications  Medication Sig Dispense Refill  . acetaminophen (TYLENOL) 500 MG tablet Take 1,000 mg by mouth every 6 (six) hours as needed for moderate pain or headache.    . fluticasone (FLONASE) 50 MCG/ACT nasal spray Place 2 sprays into both nostrils daily as needed for allergies or rhinitis. 16 g 3  . furosemide (LASIX) 80 MG tablet Take 1 tablet (80 mg total) by mouth daily. 90 tablet 3  . hydroxypropyl methylcellulose / hypromellose (ISOPTO TEARS / GONIOVISC) 2.5 % ophthalmic solution Place 1 drop into both eyes daily as needed for dry eyes.    Marland Kitchen KLOR-CON M20 20 MEQ tablet TAKE 1 TABLET TWICE DAILY 180 tablet 2  . losartan (COZAAR) 25 MG tablet Take 0.5 tablets (12.5 mg total) by mouth daily. 90 tablet 3  . metoprolol succinate (TOPROL-XL) 25 MG 24 hr tablet Take 0.5 tablets (12.5 mg total) by mouth at bedtime. 15 tablet 3  . pravastatin (PRAVACHOL) 80 MG tablet Take 0.5 tablets (40 mg total) by mouth daily. 45 tablet 3  . predniSONE (DELTASONE) 5 MG tablet Take 1-2 tablets (5-10 mg total) by mouth daily with breakfast. 60 tablet 0  . spironolactone (ALDACTONE) 25 MG tablet Take 0.5 tablets (12.5 mg total) by mouth every evening. 45 tablet 3  . tamsulosin (FLOMAX) 0.4 MG CAPS capsule Take 1 capsule (0.4 mg total) by mouth daily. 90 capsule 3  . warfarin (COUMADIN) 5 MG  tablet TAKE 1/2 TO 1 TABLET (2.5 TO 5 MG TOTAL) DAILY AS DIRECTED BY ANTICOAGULATION CLINIC 90 tablet 3   Objective: BP 108/60 (BP Location: Left Arm, Patient Position: Sitting, Cuff Size: Large)   Pulse 71   Ht 5' 6" (1.676 m)   Wt 159 lb (72.1 kg)   SpO2 97%   BMI 25.66 kg/m  Gen: NAD, resting comfortably CV: RRR  Lungs: Nonlabored Abdomen: Nondistended Ext: no edema Skin: warm, dry MSK: over right buttocks there is a 9 x 9 cm raised area.  This area is somewhat tender to touch.  No surrounding erythema.  No warmth.  Patient uncomfortable walking and grabs at this area.  Also has pain in  this area when getting onto exam table.  Patient reports pain with flexion of the thigh.  With flexion and extension of the lower leg he has no pain. Assessment/Plan:  Right buttock pain - Plan: Ambulatory referral to Sports Medicine S:  had a bruise on right buttocks for at least 4 weeks. Over the last week has become more painful. Now even having trouble walking with it, getting clothes on, getting pants on- uses a grabber to help.  Pain can be moderate to severe.  Aching type pain.  Worse when he puts pressure on it.  Did move his wallet out of the right back pocket which helped some. A/P: Patient is on Coumadin (INR has nto been above 3 in lst 3 months- most recently 1.9) and I am concerned about potential hematoma versus gluteus maximus tear versus a combination of the 2.  I have advised him to follow-up with Dr. Paulla Fore tomorrow morning for further evaluation.  He is in agreement with the plan.  Future Appointments  Date Time Provider House  04/01/2017  8:20 AM Gerda Diss, DO [facility]  04/12/2017  9:30 AM [name], MD [facility]  04/20/2017  9:00 AM [facility]   Lab/Order associations: Right buttock pain - Plan: Ambulatory referral to Sports Medicine  Return precautions advised.  Garret Reddish, MD

## 2017-04-01 ENCOUNTER — Encounter: Payer: Self-pay | Admitting: Sports Medicine

## 2017-04-01 ENCOUNTER — Ambulatory Visit (INDEPENDENT_AMBULATORY_CARE_PROVIDER_SITE_OTHER): Payer: Medicare Other | Admitting: Sports Medicine

## 2017-04-01 ENCOUNTER — Ambulatory Visit: Payer: Self-pay

## 2017-04-01 VITALS — BP 104/62 | HR 71 | Ht 66.0 in | Wt 158.4 lb

## 2017-04-01 DIAGNOSIS — Z7901 Long term (current) use of anticoagulants: Secondary | ICD-10-CM

## 2017-04-01 DIAGNOSIS — M7918 Myalgia, other site: Secondary | ICD-10-CM

## 2017-04-01 DIAGNOSIS — T148XXA Other injury of unspecified body region, initial encounter: Secondary | ICD-10-CM | POA: Diagnosis not present

## 2017-04-01 NOTE — Assessment & Plan Note (Signed)
Likely traumatic hematoma with sub gluteal bursa irritation likely from sitting on his wall and on a regular basis.  Aspiration injection performed today after discussing the risks and benefits.  This is likely exacerbated by his underlying Coumadin use and should respond well to treatment today.  Continue with compression and icing.

## 2017-04-01 NOTE — Patient Instructions (Signed)

## 2017-04-01 NOTE — Progress Notes (Signed)
[name]. [name], [name] at [place]  [name] - 84 y.o. male MRN [id number]  Date of birth: 1929-08-07  Visit Date: 04/01/2017  PCP: Craig Olp, MD   Referred by: Craig Olp, MD   Scribe for today's visit: [name], CMA     SUBJECTIVE:  [name] is here for New Patient (Initial Visit) (RT gluteal pain) .  Referred by: Craig Reddish, MD His RT gluteal pain symptoms INITIALLY: Began about 4 weeks ago. Pt does put wallet in RT back pocket.  Described as moderate/severe aching and bruising, nonradiating Worsened with walking, dressing, applying pressure.  Improved with taking wallet out of back pocket. Pain also improves when lying on back or stomach.  Additional associated symptoms include: Pt saw PCP yesterday, he would like to r/o muscle tear vs hematoma.     At this time symptoms are worsening compared to onset, becoming difficult to walk and do ADLs like putting on pants.  He has been taking Aspirin or Tylenol with some relief. He has also tried using heat and ice with some relief.    ROS Reports night time disturbances. Denies fevers, chills, or night sweats. Denies unexplained weight loss. Denies personal history of cancer. Denies changes in bowel or bladder habits. Denies recent unreported falls. Denies new or worsening dyspnea or wheezing. Denies headaches or dizziness.  Denies numbness, tingling or weakness  In the extremities.  Denies dizziness or presyncopal episodes Denies lower extremity edema     HISTORY & PERTINENT PRIOR DATA:  Prior History reviewed and updated per electronic medical record.  Significant history, findings, studies and interim changes include:  reports that he quit smoking about 34 years ago. His smoking use included cigarettes. He has a 40.00 pack-year smoking history. he has never used smokeless tobacco. No results for input(s): HGBA1C,  LABURIC, CREATINE in the last 8760 hours. No specialty comments available. Problem  Traumatic Hematoma    OBJECTIVE:  VS:  HT:5\' 6"  (167.6 cm)   WT:158 lb 6.4 oz (71.8 kg)  BMI:25.58    BP:104/62  HR:71bpm  TEMP: ( )  RESP:97 %   PHYSICAL EXAM: Constitutional: WDWN, Non-toxic appearing. Psychiatric: Alert & appropriately interactive.  Not depressed or anxious appearing. Respiratory: No increased work of breathing.  Trachea Midline Eyes: Pupils are equal.  EOM intact without nystagmus.  No scleral icterus  NEUROVASCULAR exam: No clubbing or cyanosis appreciated No significant venous stasis changes Capillary Refill: normal, less than 2 seconds   LOWER Extremities  Pre-tibial edema: No significant pretibial edema Pedal Pulses: Normal & symmetrically palpable Dermatomes intact to light touch Normal strength in all myotomes Reflexes: Normal & symmetric DTRs    Right buttock has generalized swelling and marked pain with palpation over the gluteal musculature.  No pain over the greater sciatic notch.  He has pain with resisted hip extension and has some tightness localizing to the buttock with straight leg raise.  He has an antalgic gait and uses a cane. No additional findings.   ASSESSMENT & PLAN:   1. Gluteal pain   2. Right buttock pain   3. Traumatic hematoma   4. Current use of long term anticoagulation    PLAN:    Traumatic hematoma Likely traumatic hematoma with sub gluteal bursa irritation likely from sitting on his wall and on a regular basis.  Aspiration injection performed today after discussing the risks and benefits.  This is likely exacerbated  by his underlying Coumadin use and should respond well to treatment today.  Continue with compression and icing.   ++++++++++++++++++++++++++++++++++++++++++++ Orders & Meds: Orders Placed This Encounter  Procedures  . [place] LIMITED JOINT SPACE STRUCTURES LOW RIGHT(NO LINKED CHARGES)    No orders of the defined types  were placed in this encounter.   ++++++++++++++++++++++++++++++++++++++++++++ Follow-up: Return in about 6 weeks (around 05/13/2017).  To discuss further treatment options and/or repeat procedure if overall good but incomplete resolution.  Pertinent documentation may be included in additional procedure notes, imaging studies, problem based documentation and patient instructions. Please see these sections of the encounter for additional information regarding this visit. CMA/ATC served as Education administrator during this visit. History, Physical, and Plan performed by medical provider. Documentation and orders reviewed and attested to.      [name], [place] [facility] Physician

## 2017-04-01 NOTE — Procedures (Signed)
PROCEDURE NOTE:  Ultrasound Guided: Aspiration and Injection: Right deep gluteal bursae aspiration and injection Images were obtained and interpreted by myself, Teresa Coombs, DO  Images have been saved and stored to PACS system. Images obtained on: GE S7 Ultrasound machine  ULTRASOUND FINDINGS:  Large sub-glute medius bursa/traumatic hematoma with homogeneous echotexture.  Overlying gluteal musculature appears to be intact with normal-appearing glute tendons.  DESCRIPTION OF PROCEDURE:  The patient's clinical condition is marked by substantial pain and/or significant functional disability. Other conservative therapy has not provided relief, is contraindicated, or not appropriate. There is a reasonable likelihood that injection will significantly improve the patient's pain and/or functional impairment.  After discussing the risks, benefits and expected outcomes of the injection and all questions were reviewed and answered, the patient wished to undergo the above named procedure. Verbal consent was obtained.  The ultrasound was used to identify the target structure and adjacent neurovascular structures. The skin was then prepped in sterile fashion and the target structure was injected under direct visualization using sterile technique as below:  PREP: Alcohol, Ethel Chloride, 5cc 1% lidocaine on 22-gauge needle APPROACH: direct, stopcock technique, 21g 2in. INJECTATE: 2cc 0.5% marcaine, 2cc 40mg /mL DepoMedrol   ASPIRATE: 25 cc of bloody aspirate   DRESSING: Band-Aid and: 6-inch Ace Wrap   Post procedural instructions including recommending icing and warning signs for infection were reviewed.  This procedure was well tolerated and there were no complications.   IMPRESSION: Succesful Ultrasound Guided: Aspiration and Injection

## 2017-04-12 ENCOUNTER — Ambulatory Visit (INDEPENDENT_AMBULATORY_CARE_PROVIDER_SITE_OTHER): Payer: Medicare Other | Admitting: Family Medicine

## 2017-04-12 ENCOUNTER — Encounter: Payer: Self-pay | Admitting: Family Medicine

## 2017-04-12 DIAGNOSIS — M353 Polymyalgia rheumatica: Secondary | ICD-10-CM | POA: Diagnosis not present

## 2017-04-12 DIAGNOSIS — E785 Hyperlipidemia, unspecified: Secondary | ICD-10-CM

## 2017-04-12 DIAGNOSIS — L570 Actinic keratosis: Secondary | ICD-10-CM | POA: Diagnosis not present

## 2017-04-12 MED ORDER — PREDNISONE 5 MG PO TABS: 2.5000 mg | ORAL_TABLET | Freq: Every day | ORAL | 1 refills | Status: DC

## 2017-04-12 NOTE — Assessment & Plan Note (Signed)
S: intermittent flaky area on left cheek with red base- seems to get better at times but never goes away A/P: appears to be AK- will treat with cryotherapy. Aftercare handout given

## 2017-04-12 NOTE — Assessment & Plan Note (Signed)
S: patient has done well over the last month on 5 mg of prednisone (he had reported he was 75% better by time of last visit- he states he is about stable at that level). Originally presented with proximal muscle weakness and stiffness for 2-3 weeks. He had brisk response to prednisone at 15mg . Sed rate was not elevated.  A/P: we will try to go down to 2.5mg  and follow up 2 months . He can go back to 5mg  if needed. Then would go down at next visit likely by 1mg  icrements- would send in 1mg  prednisone tablets.

## 2017-04-12 NOTE — Patient Instructions (Addendum)
Come back in April 9th at 9 am- have them schedule you at the check out desk before you leave  Go down to 2.5mg  of prednisone (half pill) as long as symptoms do not get far worse- could go back to 5mg  if needed then step back down a month later  Come fasting to next visit so we can update your cholesterol  Cryotherapy after care handout

## 2017-04-12 NOTE — Progress Notes (Signed)
Subjective:  RANNIE CRANEY is a 84 y.o. year old very pleasant male patient who presents for/with See problem oriented charting ROS- some proximal muscle weakness. No fever or chills. No chest pain. No edema.    Past Medical History-  Patient Active Problem List   Diagnosis Date Noted  . Chronic systolic CHF (congestive heart failure) ([place]) 03/21/2013    Priority: High  . Nonischemic cardiomyopathy ([place]) 09/20/2010    Priority: High  . PPM-[place] 11/27/2008    Priority: High  . CAD (coronary artery disease) 07/20/2008    Priority: High  . SICK SINUS SYNDROME 07/20/2008    Priority: High  . Polymyalgia rheumatica ([place]) 07/03/2008    Priority: High  . Atrial fibrillation ([place]) 03/16/2007    Priority: High  . AK (actinic keratosis) 01/28/2016    Priority: Medium  . Lipoma 07/08/2015    Priority: Medium  . Neuropathy 05/16/2015    Priority: Medium  . Chronic cough 08/18/2013    Priority: Medium  . Pleural effusion, right 08/16/2013    Priority: Medium  . Pleural effusion on right 07/06/2012    Priority: Medium  . Hyperlipidemia 03/16/2007    Priority: Medium  . BPH (benign prostatic hyperplasia) 03/16/2007    Priority: Medium  . Left breast lump 07/15/2015    Priority: Low  . Encounter for therapeutic drug monitoring 07/02/2014    Priority: Low  . Allergic rhinitis 02/14/2014    Priority: Low  . Fecal incontinence 10/18/2013    Priority: Low  . Recurrent right inguinal hernia 11/22/2012    Priority: Low  . Intention tremor 11/01/2012    Priority: Low  . MIXED HEARING LOSS BILATERAL 01/10/2008    Priority: Low  . GERD 03/16/2007    Priority: Low  . Traumatic hematoma 04/01/2017  . Left knee pain 10/23/2016  . Prolapsed internal hemorrhoids, grade 2 - causing fecal seepage 06/10/2016  . Atrial fibrillation with tachycardic ventricular rate ([place]) 04/20/2016  . Chronic anticoagulation 02/10/2016  . Trigger thumb of right hand 07/04/2014    Medications-  reviewed and updated Current Outpatient Medications  Medication Sig Dispense Refill  . acetaminophen (TYLENOL) 500 MG tablet Take 1,000 mg by mouth every 6 (six) hours as needed for moderate pain or headache.    . fluticasone (FLONASE) 50 MCG/ACT nasal spray Place 2 sprays into both nostrils daily as needed for allergies or rhinitis. 16 g 3  . furosemide (LASIX) 80 MG tablet Take 1 tablet (80 mg total) by mouth daily. 90 tablet 3  . hydroxypropyl methylcellulose / hypromellose (ISOPTO TEARS / GONIOVISC) 2.5 % ophthalmic solution Place 1 drop into both eyes daily as needed for dry eyes.    Marland Kitchen KLOR-CON M20 20 MEQ tablet TAKE 1 TABLET TWICE DAILY 180 tablet 2  . losartan (COZAAR) 25 MG tablet Take 0.5 tablets (12.5 mg total) by mouth daily. 90 tablet 3  . metoprolol succinate (TOPROL-XL) 25 MG 24 hr tablet Take 0.5 tablets (12.5 mg total) by mouth at bedtime. 15 tablet 3  . pravastatin (PRAVACHOL) 80 MG tablet Take 0.5 tablets (40 mg total) by mouth daily. 45 tablet 3  . predniSONE (DELTASONE) 5 MG tablet Take 0.5-1 tablets (2.5-5 mg total) by mouth daily with breakfast. 30 tablet 1  . spironolactone (ALDACTONE) 25 MG tablet Take 0.5 tablets (12.5 mg total) by mouth every evening. 45 tablet 3  . tamsulosin (FLOMAX) 0.4 MG CAPS capsule Take 1 capsule (0.4 mg total) by mouth daily. 90 capsule 3  . warfarin (  COUMADIN) 5 MG tablet TAKE 1/2 TO 1 TABLET (2.5 TO 5 MG TOTAL) DAILY AS DIRECTED BY ANTICOAGULATION CLINIC 90 tablet 3   Objective: BP (!) 90/58 (BP Location: Left Arm, Patient Position: Sitting, Cuff Size: Large)   Pulse 67   Temp (!) 97.3 F (36.3 C) (Oral)   Ht 5\' 6"  (1.676 m)   Wt 154 lb 6.4 oz (70 kg)   SpO2 95%   BMI 24.92 kg/m  Gen: NAD, resting comfortably CV: regular rate Lungs: nonlabored Ext: no edema Skin: warm, dry, on left cheek under 5 x 5 mm scaly lesion with red base Neuro: walks without cane- some mild difficulty standing  Assessment/Plan:  Hyperlipidemia S:  reasonably controlled on pravastatin 40mg .  Lab Results  Component Value Date   CHOL 128 11/05/2015   HDL 34 (L) 11/05/2015   LDLCALC 81 11/05/2015   LDLDIRECT 92.0 12/06/2014   TRIG 65 11/05/2015   CHOLHDL 3.8 11/05/2015   A/P: update lipids at next visit- he will come fasting. I do not think his proximal muscle weakness symptoms are statin related though if another recurrence in future we could consider trial off   Polymyalgia rheumatica ([place]) S: patient has done well over the last month on 5 mg of prednisone (he had reported he was 75% better by time of last visit- he states he is about stable at that level). Originally presented with proximal muscle weakness and stiffness for 2-3 weeks. He had brisk response to prednisone at 15mg . Sed rate was not elevated.  A/P: we will try to go down to 2.5mg  and follow up 2 months . He can go back to 5mg  if needed. Then would go down at next visit likely by 1mg  icrements- would send in 1mg  prednisone tablets.   AK (actinic keratosis) S: intermittent flaky area on left cheek with red base- seems to get better at times but never goes away A/P: appears to be AK- will treat with cryotherapy. Aftercare handout given   Future Appointments  Date Time Provider [place]  04/20/2017  9:00 AM [facility]  05/13/2017  8:20 AM Gerda Diss, DO [facility]   Return in about 2 months (around 06/10/2017) for follow up- or sooner if needed.  Lab/Order associations: Polymyalgia rheumatica ([place]) - Plan: predniSONE (DELTASONE) 5 MG tablet  Meds ordered this encounter  Medications  . predniSONE (DELTASONE) 5 MG tablet    Sig: Take 0.5-1 tablets (2.5-5 mg total) by mouth daily with breakfast.    Dispense:  30 tablet    Refill:  1   Return precautions advised.  Garret Reddish, MD

## 2017-04-12 NOTE — Assessment & Plan Note (Signed)
S: reasonably controlled on pravastatin 40mg .  Lab Results  Component Value Date   CHOL 128 11/05/2015   HDL 34 (L) 11/05/2015   LDLCALC 81 11/05/2015   LDLDIRECT 92.0 12/06/2014   TRIG 65 11/05/2015   CHOLHDL 3.8 11/05/2015   A/P: update lipids at next visit- he will come fasting. I do not think his proximal muscle weakness symptoms are statin related though if another recurrence in future we could consider trial off

## 2017-04-15 ENCOUNTER — Other Ambulatory Visit: Payer: Self-pay | Admitting: Family Medicine

## 2017-04-15 DIAGNOSIS — M353 Polymyalgia rheumatica: Secondary | ICD-10-CM

## 2017-04-20 ENCOUNTER — Ambulatory Visit (INDEPENDENT_AMBULATORY_CARE_PROVIDER_SITE_OTHER): Payer: Medicare Other | Admitting: *Deleted

## 2017-04-20 DIAGNOSIS — Z5181 Encounter for therapeutic drug level monitoring: Secondary | ICD-10-CM | POA: Diagnosis not present

## 2017-04-20 DIAGNOSIS — I4891 Unspecified atrial fibrillation: Secondary | ICD-10-CM | POA: Diagnosis not present

## 2017-04-20 LAB — POCT INR: INR: 2.7

## 2017-04-20 NOTE — Patient Instructions (Signed)
Description   Continue same dose of coumadin  1 tablet daily except 1/2 tablet on Saturdays.  Recheck INR in 4 weeks.  Call us with any medication changes #  [phone]. Pt is currently on Prednisone 2.5 mg daily .

## 2017-05-13 ENCOUNTER — Ambulatory Visit (INDEPENDENT_AMBULATORY_CARE_PROVIDER_SITE_OTHER): Payer: Medicare Other | Admitting: Sports Medicine

## 2017-05-13 ENCOUNTER — Ambulatory Visit: Payer: Self-pay

## 2017-05-13 ENCOUNTER — Encounter: Payer: Self-pay | Admitting: Sports Medicine

## 2017-05-13 VITALS — BP 100/60 | HR 66 | Ht 66.0 in | Wt 156.8 lb

## 2017-05-13 DIAGNOSIS — T148XXA Other injury of unspecified body region, initial encounter: Secondary | ICD-10-CM

## 2017-05-13 DIAGNOSIS — Z7901 Long term (current) use of anticoagulants: Secondary | ICD-10-CM

## 2017-05-13 DIAGNOSIS — M353 Polymyalgia rheumatica: Secondary | ICD-10-CM

## 2017-05-13 NOTE — Patient Instructions (Addendum)

## 2017-05-13 NOTE — Procedures (Signed)
PROCEDURE NOTE:  Ultrasound Guided: Aspiration and Injection: right deep gluteal burase Images were obtained and interpreted by myself, Teresa Coombs, DO  Images have been saved and stored to PACS system. Images obtained on: GE S7 Ultrasound machine  ULTRASOUND FINDINGS:  Reaccumulation of bursal fluid in the sub-gluteal bursa.  DESCRIPTION OF PROCEDURE:  The patient's clinical condition is marked by substantial pain and/or significant functional disability. Other conservative therapy has not provided relief, is contraindicated, or not appropriate. There is a reasonable likelihood that injection will significantly improve the patient's pain and/or functional impairment.  After discussing the risks, benefits and expected outcomes of the injection and all questions were reviewed and answered, the patient wished to undergo the above named procedure. Verbal consent was obtained.  The ultrasound was used to identify the target structure and adjacent neurovascular structures. The skin was then prepped in sterile fashion and the target structure was injected under direct visualization using sterile technique as below:  PREP: Alcohol, Ethel Chloride, 5cc 1% lidocaine on  1.5 in. Needle APPROACH: direct, stopcock technique, 21g 2in.  INJECTATE: 1cc: 0.5% marcaine, 1cc: 40mg /mL DepoMedrol   ASPIRATE: 15cc   DRESSING: Band-Aid &: 6-inch Ace Wrap  Post procedural instructions including recommending icing and warning signs for infection were reviewed.   This procedure was well tolerated and there were no complications.   IMPRESSION: Succesful Ultrasound Guided: Aspiration and Injection

## 2017-05-13 NOTE — Progress Notes (Signed)
[name]. [name], [name] at [place]  [name] - 84 y.o. male MRN [id number]  Date of birth: 13-Feb-1930  Visit Date: 05/13/2017  PCP: [name], MD   Referred by: [name], MD   Scribe for today's visit: Josepha Pigg, CMA     SUBJECTIVE:  [name] is here for Follow-up (R glute pain)  04/01/17: His RT gluteal pain symptoms INITIALLY: Began about 4 weeks ago. Pt does put wallet in RT back pocket.  Described as moderate/severe aching and bruising, nonradiating Worsened with walking, dressing, applying pressure.  Improved with taking wallet out of back pocket. Pain also improves when lying on back or stomach.  Additional associated symptoms include: Pt saw PCP yesterday, he would like to r/o muscle tear vs hematoma.  At this time symptoms are worsening compared to onset, becoming difficult to walk and do ADLs like putting on pants.  He has been taking Aspirin or Tylenol with some relief. He has also tried using heat and ice with some relief.   05/13/17: Compared to the last office visit, his previously described symptoms are improving. He felt a lot of relief after the injection but now feels like the pain is coming back. Pain is not as severe as it was before. Still having pain when sitting or lying on the R side.  Current symptoms are mild-moderate & are radiating to R leg. He does have some soreness in his groin.  He has been taking Tylenol and Aspirin prn. He did use compression for a little while then stopped because the pain wasn't bad.    ROS Reports night time disturbances. Denies fevers, chills, or night sweats. Denies unexplained weight loss. Denies personal history of cancer. Denies changes in bowel or bladder habits. Denies recent unreported falls. Denies new or worsening dyspnea or wheezing. Denies headaches or dizziness.  Reports numbness, tingling or weakness  In the  extremities.  Denies dizziness or presyncopal episodes Denies lower extremity edema    HISTORY & PERTINENT PRIOR DATA:  Prior History reviewed and updated per electronic medical record.  Significant history, findings, studies and interim changes include:  reports that he quit smoking about 34 years ago. His smoking use included cigarettes. He has a 40.00 pack-year smoking history. he has never used smokeless tobacco. No results for input(s): HGBA1C, LABURIC, CREATINE in the last 8760 hours. No specialty comments available. No problems updated.  OBJECTIVE:  VS:  HT:5\' 6"  (167.6 cm)   WT:156 lb 12.8 oz (71.1 kg)  BMI:25.32    BP:100/60  HR:66bpm  TEMP: ( )  RESP:99 %   PHYSICAL EXAM: Constitutional: WDWN, Non-toxic appearing. Psychiatric: Alert & appropriately interactive.  Not depressed or anxious appearing. Respiratory: No increased work of breathing.  Trachea Midline Eyes: Pupils are equal.  EOM intact without nystagmus.  No scleral icterus  NEUROVASCULAR exam: No clubbing or cyanosis appreciated No significant venous stasis changes Capillary Refill: normal, less than 2 seconds   RIGHT HIP: Persistent swelling within the right gluteal region.  He has mild weakness with hip abduction with a significant T FL predominance.  Only minimal pain with this.  Internal and external rotation of his hip is good.  ASSESSMENT & PLAN:   1. Traumatic hematoma   2. Polymyalgia rheumatica ([place])   3. Chronic anticoagulation    ++++++++++++++++++++++++++++++++++++++++++++ Orders & Meds:  Orders Placed This Encounter  Procedures  . [place] MSK POCT ULTRASOUND  No orders of the defined types were placed in this encounter.   ++++++++++++++++++++++++++++++++++++++++++++ PLAN: We discussed multiple options with him today and ultimately decided to go ahead and repeat aspiration and injection given the overall good improvement.  He had less swelling than in the past and I am hoping that this will  be synergistic to the prior injection.  He should continue with icing and compression and will plan to check in with him in 6 weeks.  He otherwise has been feeling significantly better and I am hopeful that his PMR is under good control.  I would like to try to minimize corticosteroids and will plan to repeat any further injections at this point.   Follow-up: Return in about 6 weeks (around 06/24/2017).   Pertinent documentation may be included in additional procedure notes, imaging studies, problem based documentation and patient instructions. Please see these sections of the encounter for additional information regarding this visit. CMA/ATC served as Education administrator during this visit. History, Physical, and Plan performed by medical provider. Documentation and orders reviewed and attested to.      Gerda Diss, [place] [facility] Physician

## 2017-05-18 ENCOUNTER — Ambulatory Visit (INDEPENDENT_AMBULATORY_CARE_PROVIDER_SITE_OTHER): Payer: Medicare Other | Admitting: *Deleted

## 2017-05-18 DIAGNOSIS — I4891 Unspecified atrial fibrillation: Secondary | ICD-10-CM | POA: Diagnosis not present

## 2017-05-18 DIAGNOSIS — Z5181 Encounter for therapeutic drug level monitoring: Secondary | ICD-10-CM

## 2017-05-18 LAB — POCT INR: INR: 1.9

## 2017-05-18 NOTE — Patient Instructions (Signed)
Description   Today take 1 and 1/2 tablets (7.5mg ) then continue same dose of coumadin  1 tablet daily except 1/2 tablet on Saturdays.  Recheck INR in 4 weeks.  Call us with any medication changes #  [phone]. Pt is currently on Prednisone 2.5 mg daily .

## 2017-05-31 ENCOUNTER — Other Ambulatory Visit: Payer: Self-pay | Admitting: Family Medicine

## 2017-05-31 DIAGNOSIS — M353 Polymyalgia rheumatica: Secondary | ICD-10-CM

## 2017-06-02 ENCOUNTER — Telehealth: Payer: Self-pay | Admitting: Family Medicine

## 2017-06-02 NOTE — Telephone Encounter (Signed)
If he has the medication in hand- does not need to pick up refill

## 2017-06-02 NOTE — Telephone Encounter (Signed)
See note.   Copied from [place]. Topic: General - Other >> Jun 02, 2017 10:15 AM Carolyn Stare wrote:  Pt call to say he received a call from the pharamcy letting him know the below med is ready for pick up. Pt said he picked up this med 05/17/17 and is not sure if he should be picking up another RX for the below med   predniSONE (DELTASONE) 5 MG tablet  [phone]   would like a call back

## 2017-06-04 NOTE — Telephone Encounter (Signed)
Called patient and left a voicemail message to let patient know as long as he has some on hand he does not need to pick up the second prescritpion

## 2017-06-15 ENCOUNTER — Encounter: Payer: Self-pay | Admitting: Family Medicine

## 2017-06-15 ENCOUNTER — Ambulatory Visit (INDEPENDENT_AMBULATORY_CARE_PROVIDER_SITE_OTHER): Payer: Medicare Other | Admitting: Family Medicine

## 2017-06-15 VITALS — BP 100/60 | HR 68 | Temp 98.2°F | Ht 66.0 in | Wt 158.4 lb

## 2017-06-15 DIAGNOSIS — I5022 Chronic systolic (congestive) heart failure: Secondary | ICD-10-CM | POA: Diagnosis not present

## 2017-06-15 DIAGNOSIS — I480 Paroxysmal atrial fibrillation: Secondary | ICD-10-CM

## 2017-06-15 DIAGNOSIS — E785 Hyperlipidemia, unspecified: Secondary | ICD-10-CM | POA: Diagnosis not present

## 2017-06-15 DIAGNOSIS — R5383 Other fatigue: Secondary | ICD-10-CM | POA: Diagnosis not present

## 2017-06-15 DIAGNOSIS — M353 Polymyalgia rheumatica: Secondary | ICD-10-CM | POA: Diagnosis not present

## 2017-06-15 LAB — CBC
HEMATOCRIT: 42 % (ref 39.0–52.0)
HEMOGLOBIN: 14.2 g/dL (ref 13.0–17.0)
MCHC: 33.9 g/dL (ref 30.0–36.0)
MCV: 92.3 fl (ref 78.0–100.0)
Platelets: 206 10*3/uL (ref 150.0–400.0)
RBC: 4.55 Mil/uL (ref 4.22–5.81)
RDW: 13.7 % (ref 11.5–15.5)
WBC: 6.9 10*3/uL (ref 4.0–10.5)

## 2017-06-15 LAB — COMPREHENSIVE METABOLIC PANEL
ALK PHOS: 59 U/L (ref 39–117)
ALT: 19 U/L (ref 0–53)
AST: 21 U/L (ref 0–37)
Albumin: 4.1 g/dL (ref 3.5–5.2)
BUN: 19 mg/dL (ref 6–23)
CHLORIDE: 102 meq/L (ref 96–112)
CO2: 26 mEq/L (ref 19–32)
Calcium: 9.1 mg/dL (ref 8.4–10.5)
Creatinine, Ser: 0.9 mg/dL (ref 0.40–1.50)
GFR: 85.02 mL/min (ref >60.00)
GLUCOSE: 109 mg/dL — AB (ref 70–99)
POTASSIUM: 4.3 meq/L (ref 3.5–5.1)
SODIUM: 136 meq/L (ref 135–145)
TOTAL PROTEIN: 6.6 g/dL (ref 6.0–8.3)
Total Bilirubin: 1.5 mg/dL — ABNORMAL HIGH (ref 0.2–1.2)

## 2017-06-15 LAB — TSH: TSH: 2.32 u[IU]/mL (ref 0.35–4.50)

## 2017-06-15 LAB — SEDIMENTATION RATE: Sed Rate: 13 mm/hr (ref 0–20)

## 2017-06-15 LAB — LDL CHOLESTEROL, DIRECT: LDL DIRECT: 69 mg/dL

## 2017-06-15 MED ORDER — PREDNISONE 1 MG PO TABS: 1.0000 mg | ORAL_TABLET | Freq: Every day | ORAL | 1 refills | Status: DC

## 2017-06-15 NOTE — Patient Instructions (Addendum)
Continue for 2 more weeks on 2.5mg  dose.   Then start just 2mg  for 3 weeks Then alternate 1 and 2mg  for 3 weeks Then 1mg  for 3 weeks Then 1 mg then 0 mg for 3 weeks Then stop  Lets check in 2-3 months from now to see how you are progressing  Please stop by lab before you go  If labs are normal and no cause of fatigue found- lets have you call cardiology to get in for a visit within the next week or so

## 2017-06-15 NOTE — Assessment & Plan Note (Signed)
S: reasonably controlled on pravastatin 40mg  thoughwould prefer Target LDL under 70 Lab Results  Component Value Date   CHOL 128 11/05/2015   HDL 34 (L) 11/05/2015   LDLCALC 81 11/05/2015   LDLDIRECT 92.0 12/06/2014   TRIG 65 11/05/2015   CHOLHDL 3.8 11/05/2015   A/P: patient not fasting today as planned- will update LDL. With proximal muscle weakness previously reported- if recurs again could consider trial off statin though doubt hat is the cause given significant improvement on prednisone. Consider increasing statin dosage to atorvastatin 20mg  if LDL not at goal

## 2017-06-15 NOTE — Progress Notes (Signed)
Subjective:  [name] is a 84 y.o. year old very pleasant male patient who presents for/with See problem oriented charting ROS- some fatigue and palpitations. No chest pain. No shortness of breath.    Past Medical History-  Patient Active Problem List   Diagnosis Date Noted  . Chronic systolic CHF (congestive heart failure) ([place]) 03/21/2013    Priority: High  . Nonischemic cardiomyopathy ([place]) 09/20/2010    Priority: High  . PPM-St.[place]ude 11/27/2008    Priority: High  . CAD (coronary artery disease) 07/20/2008    Priority: High  . SICK SINUS SYNDROME 07/20/2008    Priority: High  . Polymyalgia rheumatica ([place]) 07/03/2008    Priority: High  . Atrial fibrillation ([place]) 03/16/2007    Priority: High  . AK (actinic keratosis) 01/28/2016    Priority: Medium  . Lipoma 07/08/2015    Priority: Medium  . Neuropathy 05/16/2015    Priority: Medium  . Chronic cough 08/18/2013    Priority: Medium  . Pleural effusion, right 08/16/2013    Priority: Medium  . Pleural effusion on right 07/06/2012    Priority: Medium  . Hyperlipidemia 03/16/2007    Priority: Medium  . BPH (benign prostatic hyperplasia) 03/16/2007    Priority: Medium  . Left breast lump 07/15/2015    Priority: Low  . Encounter for therapeutic drug monitoring 07/02/2014    Priority: Low  . Allergic rhinitis 02/14/2014    Priority: Low  . Fecal incontinence 10/18/2013    Priority: Low  . Recurrent right inguinal hernia 11/22/2012    Priority: Low  . Intention tremor 11/01/2012    Priority: Low  . MIXED HEARING LOSS BILATERAL 01/10/2008    Priority: Low  . GERD 03/16/2007    Priority: Low  . Traumatic hematoma 04/01/2017  . Left knee pain 10/23/2016  . Prolapsed internal hemorrhoids, grade 2 - causing fecal seepage 06/10/2016  . Atrial fibrillation with tachycardic ventricular rate ([place]) 04/20/2016  . Chronic anticoagulation 02/10/2016  . Trigger thumb of right hand 07/04/2014    Medications- reviewed  and updated Current Outpatient Medications  Medication Sig Dispense Refill  . acetaminophen (TYLENOL) 500 MG tablet Take 1,000 mg by mouth every 6 (six) hours as needed for moderate pain or headache.    . fluticasone (FLONASE) 50 MCG/ACT nasal spray Place 2 sprays into both nostrils daily as needed for allergies or rhinitis. 16 g 3  . furosemide (LASIX) 80 MG tablet Take 1 tablet (80 mg total) by mouth daily. 90 tablet 3  . hydroxypropyl methylcellulose / hypromellose (ISOPTO TEARS / GONIOVISC) 2.5 % ophthalmic solution Place 1 drop into both eyes daily as needed for dry eyes.    Marland Kitchen KLOR-CON M20 20 MEQ tablet TAKE 1 TABLET TWICE DAILY 180 tablet 2  . losartan (COZAAR) 25 MG tablet Take 0.5 tablets (12.5 mg total) by mouth daily. 90 tablet 3  . metoprolol succinate (TOPROL-XL) 25 MG 24 hr tablet Take 0.5 tablets (12.5 mg total) by mouth at bedtime. 15 tablet 3  . pravastatin (PRAVACHOL) 80 MG tablet Take 0.5 tablets (40 mg total) by mouth daily. 45 tablet 3  . predniSONE (DELTASONE) 5 MG tablet TAKE 1-2 TABLETS (5-10 MG TOTAL) BY MOUTH DAILY WITH BREAKFAST. (Patient taking differently: TAKE 0.5 TABLETS (2.5MG TOTAL) BY MOUTH DAILY WITH BREAKFAST.) 60 tablet 0  . spironolactone (ALDACTONE) 25 MG tablet Take 0.5 tablets (12.5 mg total) by mouth every evening. 45 tablet 3  . tamsulosin (FLOMAX) 0.4 MG CAPS capsule Take 1 capsule (0.4  mg total) by mouth daily. 90 capsule 3  . warfarin (COUMADIN) 5 MG tablet TAKE 1/2 TO 1 TABLET (2.5 TO 5 MG TOTAL) DAILY AS DIRECTED BY ANTICOAGULATION CLINIC 90 tablet 3   Objective: BP 100/60   Pulse 68   Temp 98.2 F (36.8 C) (Oral)   Ht '5\' 6"'  (1.676 m)   Wt 158 lb 6.4 oz (71.8 kg)   SpO2 97%   BMI 25.57 kg/m  Gen: NAD, resting comfortably CV: irregularly irregular with HR 68 to my ears, no rubs or gallops Lungs: CTAB no crackles, wheeze, rhonchi Ext: no edema Skin: warm, dry MSK: able to fully lift arms over head without  pain  Assessment/Plan:  Fatigue S: for last 3 days has had lower energy. HR has run closer to 60 and from his understanding supposed to run at 72 with pacemaker. Noticing some palpitations.  A/P: we will check CBC, ESR, CMP, TSH. If no cause of symptoms on labs- he is going to call for cardiology follow up. We discussed EKG- but he will get this done at cardiology anyway so opts out for now. Weight is up some but no crackles or edema- doubt this is CHF. he remains compliant with lasix 66m  Hyperlipidemia S: reasonably controlled on pravastatin 457mthoughwould prefer Target LDL under 70 Lab Results  Component Value Date   CHOL 128 11/05/2015   HDL 34 (L) 11/05/2015   LDLCALC 81 11/05/2015   LDLDIRECT 92.0 12/06/2014   TRIG 65 11/05/2015   CHOLHDL 3.8 11/05/2015   A/P: patient not fasting today as planned- will update LDL. With proximal muscle weakness previously reported- if recurs again could consider trial off statin though doubt hat is the cause given significant improvement on prednisone. Consider increasing statin dosage to atorvastatin 2062mf LDL not at goal  Polymyalgia rheumatica ([place]: Patient initially presented with proximal muscle weakness and pain back in december though ESR and CRP not elevated at that time- we were concerned about polymyalgia rheumatica. Similar occurrence in past and improved with prednisone course.   Last visit he was to go to 2.5mg79md follow up in 2 months with plan to decrease by 0.5-1mg 78mrements every few weeks. He tried the 2.5mg b24msymptoms worsened so he returned to 5mg an32mhen just started back at 2.5mg rec42mly and doing ok A/P: see avs titration schedule. Update labs. By 3 months should be off prednisone if no set backs  Future Appointments  Date Time Provider [place]  8:45 AM [facility]  06/24/2017  8:40 AM Rigby, MGerda DissC[facility]  09/14/2017  9:45 AM Hunter, Yong Channeln Brayton MarsC[facility]   No follow-ups on file.  Lab/Order associations: Polymyalgia rheumatica (HCC) - [place]: Sed[place] rate  Paroxysmal atrial fibrillation (HCC)  Other fatigue - Plan: CBC, Comprehensive metabolic panel, TSH, Sedimentation rate  Hyperlipidemia, unspecified hyperlipidemia type - Plan: LDL cholesterol, direct  Chronic systolic CHF (congestive heart failure) (HCC)  [place] this encounter  Medications  . predniSONE (DELTASONE) 1 MG tablet    Sig: Take 1-2 tablets (1-2 mg total) by mouth daily with breakfast.    Dispense:  60 tablet    Refill:  1    Return precautions advised.  Stephen Garret Reddish

## 2017-06-15 NOTE — Assessment & Plan Note (Signed)
S: Patient initially presented with proximal muscle weakness and pain back in december though ESR and CRP not elevated at that time- we were concerned about polymyalgia rheumatica. Similar occurrence in past and improved with prednisone course.   Last visit he was to go to 2.3m and follow up in 2 months with plan to decrease by 0.5-169mincrements every few weeks. He tried the 2.66m41mut symptoms worsened so he returned to 66mg52md then just started back at 2.66mg 46mently and doing ok A/P: see avs titration schedule. Update labs. By 3 months should be off prednisone if no set backs

## 2017-06-16 ENCOUNTER — Telehealth: Payer: Self-pay

## 2017-06-16 ENCOUNTER — Other Ambulatory Visit: Payer: Self-pay | Admitting: Family Medicine

## 2017-06-16 ENCOUNTER — Ambulatory Visit (INDEPENDENT_AMBULATORY_CARE_PROVIDER_SITE_OTHER): Payer: Medicare Other | Admitting: *Deleted

## 2017-06-16 DIAGNOSIS — I4891 Unspecified atrial fibrillation: Secondary | ICD-10-CM

## 2017-06-16 DIAGNOSIS — Z5181 Encounter for therapeutic drug level monitoring: Secondary | ICD-10-CM | POA: Diagnosis not present

## 2017-06-16 DIAGNOSIS — M353 Polymyalgia rheumatica: Secondary | ICD-10-CM

## 2017-06-16 LAB — POCT INR: INR: 2.2

## 2017-06-16 NOTE — Patient Instructions (Signed)
Description   Continue same dose of coumadin  1 tablet daily except 1/2 tablet on Saturdays.  Recheck INR in 4 weeks.  Call us with any medication changes #  [phone]. Pt is currently on Prednisone 2.5 mg daily .

## 2017-06-16 NOTE — Telephone Encounter (Signed)
Called patient and left a message to call office back.  

## 2017-06-17 ENCOUNTER — Telehealth: Payer: Self-pay

## 2017-06-17 NOTE — Telephone Encounter (Signed)
Patient called office and I gave him his lab results from Tuesday. Patient did not have any questions. Patient verbalized understanding.

## 2017-06-17 NOTE — Telephone Encounter (Signed)
Called patient and left a message to call office back.  

## 2017-06-21 ENCOUNTER — Ambulatory Visit: Payer: Medicare Other | Admitting: Sports Medicine

## 2017-06-22 ENCOUNTER — Telehealth: Payer: Self-pay | Admitting: Family Medicine

## 2017-06-22 ENCOUNTER — Encounter: Payer: Self-pay | Admitting: Sports Medicine

## 2017-06-22 ENCOUNTER — Other Ambulatory Visit: Payer: Self-pay

## 2017-06-22 ENCOUNTER — Ambulatory Visit: Payer: Medicare Other | Admitting: Sports Medicine

## 2017-06-22 ENCOUNTER — Ambulatory Visit (INDEPENDENT_AMBULATORY_CARE_PROVIDER_SITE_OTHER): Payer: Medicare Other | Admitting: Sports Medicine

## 2017-06-22 VITALS — BP 100/60 | HR 71 | Ht 66.0 in | Wt 154.6 lb

## 2017-06-22 DIAGNOSIS — Z7901 Long term (current) use of anticoagulants: Secondary | ICD-10-CM

## 2017-06-22 DIAGNOSIS — T148XXA Other injury of unspecified body region, initial encounter: Secondary | ICD-10-CM

## 2017-06-22 DIAGNOSIS — M353 Polymyalgia rheumatica: Secondary | ICD-10-CM

## 2017-06-22 MED ORDER — PREDNISONE 1 MG PO TABS: 1.0000 mg | ORAL_TABLET | Freq: Every day | ORAL | 1 refills | Status: DC

## 2017-06-22 NOTE — Telephone Encounter (Signed)
MEDICATION:  predniSONE (DELTASONE) 1 MG tablet  PHARMACY:   [id number] - [place], [place] - [address] [phone] (Phone) [phone] (Fax)    IS THIS A 90 DAY SUPPLY : N  IS PATIENT OUT OF MEDICATION: N  IF NOT; HOW MUCH IS LEFT: 2-3 weeks   LAST APPOINTMENT DATE: @4 /16/2019  NEXT APPOINTMENT DATE:@7 /11/2017  OTHER COMMENTS:    **Let patient know to contact pharmacy at the end of the day to make sure medication is ready. **  ** Please notify patient to allow 48-72 hours to process**  **Encourage patient to contact the pharmacy for refills or they can request refills through [facility]**

## 2017-06-22 NOTE — Telephone Encounter (Signed)
I sent in the Prednisone 1 mg into his pharmacy

## 2017-06-22 NOTE — Progress Notes (Signed)
[name]. [place], [place] [facility] [place]  [name] - 84 y.o. male MRN [id number]  Date of birth: 1929/05/27  Visit Date: 06/22/2017  PCP: [name], MD   Referred by: [name], MD  Scribe for today's visit: Josepha Pigg, CMA     SUBJECTIVE:  [name] is here for Follow-up (traumatic hematoma )  04/01/17: His RT gluteal pain symptoms INITIALLY: Began about 4 weeks ago. Pt does put wallet in RT back pocket.  Described as moderate/severe aching and bruising, nonradiating Worsened with walking, dressing, applying pressure.  Improved with taking wallet out of back pocket. Pain also improves when lying on back or stomach.  Additional associated symptoms include: Pt saw PCP yesterday, he would like to r/o muscle tear vs hematoma.  At this time symptoms are worsening compared to onset, becoming difficult to walk and do ADLs like putting on pants.  He has been taking Aspirin or Tylenol with some relief. He has also tried using heat and ice with some relief.   05/13/17: Compared to the last office visit, his previously described symptoms are improving. He felt a lot of relief after the injection but now feels like the pain is coming back. Pain is not as severe as it was before. Still having pain when sitting or lying on the R side.  Current symptoms are mild-moderate & are radiating to R leg. He does have some soreness in his groin.  He has been taking Tylenol and Aspirin prn. He did use compression for a little while then stopped because the pain wasn't bad.   06/22/2017: Compared to the last office visit, his previously described symptoms are improving  Current symptoms are mild & are nonradiating. He reports that pain has resolved so he is not currently taking any medications.    ROS Denies night time disturbances. Denies fevers, chills, or night sweats. Denies unexplained weight loss. Denies  personal history of cancer. Denies changes in bowel or bladder habits. Denies recent unreported falls. Denies new or worsening dyspnea or wheezing. Denies headaches or dizziness.  Denies numbness, tingling or weakness  In the extremities.  Denies dizziness or presyncopal episodes Denies lower extremity edema    HISTORY & PERTINENT PRIOR DATA:  Prior History reviewed and updated per electronic medical record.  Significant/pertinent history, findings, studies include:  reports that he quit smoking about 34 years ago. His smoking use included cigarettes. He has a 40.00 pack-year smoking history. He has never used smokeless tobacco. No results for input(s): HGBA1C, LABURIC, CREATINE in the last 8760 hours. No specialty comments available. No problems updated.  OBJECTIVE:  VS:  HT:5\' 6"  (167.6 cm)   WT:154 lb 9.6 oz (70.1 kg)  BMI:24.96    BP:100/60  HR:71bpm  TEMP: ( )  RESP:97 %   PHYSICAL EXAM: Constitutional: WDWN, Non-toxic appearing. Psychiatric: Alert & appropriately interactive.  Not depressed or anxious appearing. Respiratory: No increased work of breathing.  Trachea Midline Eyes: Pupils are equal.  EOM intact without nystagmus.  No scleral icterus  Vascular Exam: warm to touch no edema  lower extremity neuro exam: unremarkable  MSK Exam: Leg pain is significantly improved.  He has almost no pain to palpation directly over the gluteal musculature.  His previous bruising and ecchymosis has completely resolved.  Hip abduction strength is remarkably good.   ASSESSMENT & PLAN:  No diagnosis found.  PLAN: He is doing quite well.  We will  have him go ahead and continue with normal day-to-day activities and avoid exacerbating this area by avoiding direct contact to the area but otherwise he should do quite well.  Prolonged steroid use did likely weaken the tendon slightly however he should do well and now that he is off of steroids due to the fact his polymyalgia rheumatica  is in the past he should have a full and normal recovery I suspect.  If any persistent symptoms he will follow-up.  Follow-up: Return if symptoms worsen or fail to improve, for We will call you about your results.        Please see additional documentation for Objective, Assessment and Plan sections. Pertinent additional documentation may be included in corresponding procedure notes, imaging studies, problem based documentation and patient instructions. Please see these sections of the encounter for additional information regarding this visit.  CMA/ATC served as Education administrator during this visit. History, Physical, and Plan performed by medical provider. Documentation and orders reviewed and attested to.      Gerda Diss, [facility] Physician

## 2017-06-24 ENCOUNTER — Ambulatory Visit: Payer: Medicare Other | Admitting: Sports Medicine

## 2017-06-26 ENCOUNTER — Other Ambulatory Visit: Payer: Self-pay | Admitting: Family Medicine

## 2017-06-26 ENCOUNTER — Other Ambulatory Visit: Payer: Self-pay | Admitting: Cardiology

## 2017-06-26 DIAGNOSIS — I5022 Chronic systolic (congestive) heart failure: Secondary | ICD-10-CM

## 2017-06-26 DIAGNOSIS — I480 Paroxysmal atrial fibrillation: Secondary | ICD-10-CM

## 2017-07-02 ENCOUNTER — Other Ambulatory Visit: Payer: Self-pay

## 2017-07-02 MED ORDER — FLUTICASONE PROPIONATE 50 MCG/ACT NA SUSP: 2.0000 | Freq: Every day | NASAL | 3 refills | Status: DC | PRN

## 2017-07-14 ENCOUNTER — Ambulatory Visit (INDEPENDENT_AMBULATORY_CARE_PROVIDER_SITE_OTHER): Payer: Medicare Other | Admitting: *Deleted

## 2017-07-14 DIAGNOSIS — Z5181 Encounter for therapeutic drug level monitoring: Secondary | ICD-10-CM

## 2017-07-14 DIAGNOSIS — I4891 Unspecified atrial fibrillation: Secondary | ICD-10-CM

## 2017-07-14 LAB — POCT INR: INR: 2.3

## 2017-07-14 NOTE — Patient Instructions (Signed)
Description   Continue same dose of coumadin  1 tablet daily except 1/2 tablet on Saturdays.  Recheck INR in 3 weeks.  Call us with any medication changes #  [phone]. Pt is currently on Prednisone 2.5 mg daily .

## 2017-07-23 ENCOUNTER — Inpatient Hospital Stay (HOSPITAL_COMMUNITY)
Admission: EM | Admit: 2017-07-23 | Discharge: 2017-07-27 | DRG: 038 | Disposition: A | Discharge status: Home / Self Care | Payer: Medicare Other | Attending: Internal Medicine | Admitting: Internal Medicine

## 2017-07-23 ENCOUNTER — Encounter (HOSPITAL_COMMUNITY): Payer: Self-pay

## 2017-07-23 ENCOUNTER — Emergency Department (HOSPITAL_COMMUNITY): Payer: Medicare Other

## 2017-07-23 DIAGNOSIS — Z7901 Long term (current) use of anticoagulants: Secondary | ICD-10-CM

## 2017-07-23 DIAGNOSIS — H3411 Central retinal artery occlusion, right eye: Secondary | ICD-10-CM | POA: Diagnosis not present

## 2017-07-23 DIAGNOSIS — I6521 Occlusion and stenosis of right carotid artery: Secondary | ICD-10-CM | POA: Diagnosis not present

## 2017-07-23 DIAGNOSIS — I481 Persistent atrial fibrillation: Secondary | ICD-10-CM | POA: Diagnosis present

## 2017-07-23 DIAGNOSIS — I420 Dilated cardiomyopathy: Secondary | ICD-10-CM | POA: Diagnosis not present

## 2017-07-23 DIAGNOSIS — I739 Peripheral vascular disease, unspecified: Secondary | ICD-10-CM | POA: Diagnosis present

## 2017-07-23 DIAGNOSIS — E785 Hyperlipidemia, unspecified: Secondary | ICD-10-CM | POA: Diagnosis present

## 2017-07-23 DIAGNOSIS — I77810 Thoracic aortic ectasia: Secondary | ICD-10-CM | POA: Diagnosis present

## 2017-07-23 DIAGNOSIS — Z950 Presence of cardiac pacemaker: Secondary | ICD-10-CM

## 2017-07-23 DIAGNOSIS — I4891 Unspecified atrial fibrillation: Secondary | ICD-10-CM | POA: Diagnosis present

## 2017-07-23 DIAGNOSIS — H3410 Central retinal artery occlusion, unspecified eye: Secondary | ICD-10-CM | POA: Diagnosis present

## 2017-07-23 DIAGNOSIS — I493 Ventricular premature depolarization: Secondary | ICD-10-CM | POA: Diagnosis present

## 2017-07-23 DIAGNOSIS — I2510 Atherosclerotic heart disease of native coronary artery without angina pectoris: Secondary | ICD-10-CM | POA: Diagnosis present

## 2017-07-23 DIAGNOSIS — I428 Other cardiomyopathies: Secondary | ICD-10-CM | POA: Diagnosis present

## 2017-07-23 DIAGNOSIS — E119 Type 2 diabetes mellitus without complications: Secondary | ICD-10-CM | POA: Diagnosis present

## 2017-07-23 DIAGNOSIS — Z9841 Cataract extraction status, right eye: Secondary | ICD-10-CM

## 2017-07-23 DIAGNOSIS — H5461 Unqualified visual loss, right eye, normal vision left eye: Secondary | ICD-10-CM | POA: Diagnosis present

## 2017-07-23 DIAGNOSIS — I5022 Chronic systolic (congestive) heart failure: Secondary | ICD-10-CM

## 2017-07-23 DIAGNOSIS — Z9842 Cataract extraction status, left eye: Secondary | ICD-10-CM

## 2017-07-23 DIAGNOSIS — M353 Polymyalgia rheumatica: Secondary | ICD-10-CM | POA: Diagnosis present

## 2017-07-23 DIAGNOSIS — N400 Enlarged prostate without lower urinary tract symptoms: Secondary | ICD-10-CM | POA: Diagnosis present

## 2017-07-23 DIAGNOSIS — Z8739 Personal history of other diseases of the musculoskeletal system and connective tissue: Secondary | ICD-10-CM | POA: Diagnosis present

## 2017-07-23 DIAGNOSIS — I110 Hypertensive heart disease with heart failure: Secondary | ICD-10-CM | POA: Diagnosis present

## 2017-07-23 DIAGNOSIS — Z419 Encounter for procedure for purposes other than remedying health state, unspecified: Secondary | ICD-10-CM

## 2017-07-23 DIAGNOSIS — H538 Other visual disturbances: Secondary | ICD-10-CM | POA: Diagnosis not present

## 2017-07-23 DIAGNOSIS — K219 Gastro-esophageal reflux disease without esophagitis: Secondary | ICD-10-CM | POA: Diagnosis present

## 2017-07-23 DIAGNOSIS — Z01818 Encounter for other preprocedural examination: Secondary | ICD-10-CM

## 2017-07-23 DIAGNOSIS — H5340 Unspecified visual field defects: Secondary | ICD-10-CM | POA: Diagnosis present

## 2017-07-23 DIAGNOSIS — Z87891 Personal history of nicotine dependence: Secondary | ICD-10-CM

## 2017-07-23 DIAGNOSIS — H349 Unspecified retinal vascular occlusion: Secondary | ICD-10-CM | POA: Diagnosis not present

## 2017-07-23 DIAGNOSIS — Z79899 Other long term (current) drug therapy: Secondary | ICD-10-CM

## 2017-07-23 DIAGNOSIS — I482 Chronic atrial fibrillation: Secondary | ICD-10-CM | POA: Diagnosis present

## 2017-07-23 DIAGNOSIS — I513 Intracardiac thrombosis, not elsewhere classified: Secondary | ICD-10-CM

## 2017-07-23 DIAGNOSIS — I6529 Occlusion and stenosis of unspecified carotid artery: Secondary | ICD-10-CM | POA: Diagnosis not present

## 2017-07-23 DIAGNOSIS — I6523 Occlusion and stenosis of bilateral carotid arteries: Secondary | ICD-10-CM | POA: Diagnosis not present

## 2017-07-23 DIAGNOSIS — Z7952 Long term (current) use of systemic steroids: Secondary | ICD-10-CM

## 2017-07-23 LAB — URINALYSIS, ROUTINE W REFLEX MICROSCOPIC
BILIRUBIN URINE: NEGATIVE
GLUCOSE, UA: NEGATIVE mg/dL
HGB URINE DIPSTICK: NEGATIVE
Ketones, ur: NEGATIVE mg/dL
Leukocytes, UA: NEGATIVE
Nitrite: NEGATIVE
PH: 5 (ref 5.0–8.0)
Protein, ur: NEGATIVE mg/dL
SPECIFIC GRAVITY, URINE: 1.012 (ref 1.005–1.030)

## 2017-07-23 LAB — COMPREHENSIVE METABOLIC PANEL
ALT: 22 U/L (ref 17–63)
ANION GAP: 7 (ref 5–15)
AST: 29 U/L (ref 15–41)
Albumin: 4.2 g/dL (ref 3.5–5.0)
Alkaline Phosphatase: 62 U/L (ref 38–126)
BUN: 28 mg/dL — ABNORMAL HIGH (ref 6–20)
CHLORIDE: 105 mmol/L (ref 101–111)
CO2: 27 mmol/L (ref 22–32)
CREATININE: 1.24 mg/dL (ref 0.61–1.24)
Calcium: 9.6 mg/dL (ref 8.9–10.3)
GFR calc non Af Amer: 51 mL/min — ABNORMAL LOW (ref >60)
GFR, EST AFRICAN AMERICAN: 59 mL/min — AB (ref >60)
Glucose, Bld: 87 mg/dL (ref 65–99)
Potassium: 4.2 mmol/L (ref 3.5–5.1)
Sodium: 139 mmol/L (ref 135–145)
Total Bilirubin: 1.3 mg/dL — ABNORMAL HIGH (ref 0.3–1.2)
Total Protein: 6.8 g/dL (ref 6.5–8.1)

## 2017-07-23 LAB — DIFFERENTIAL
ABS IMMATURE GRANULOCYTES: 0 10*3/uL (ref 0.0–0.1)
BASOS ABS: 0.1 10*3/uL (ref 0.0–0.1)
BASOS PCT: 1 %
Eosinophils Absolute: 0.2 10*3/uL (ref 0.0–0.7)
Eosinophils Relative: 2 %
Immature Granulocytes: 0 %
Lymphocytes Relative: 29 %
Lymphs Abs: 2.5 10*3/uL (ref 0.7–4.0)
MONO ABS: 0.7 10*3/uL (ref 0.1–1.0)
Monocytes Relative: 9 %
NEUTROS ABS: 5.1 10*3/uL (ref 1.7–7.7)
NEUTROS PCT: 59 %

## 2017-07-23 LAB — APTT: APTT: 40 s — AB (ref 24–36)

## 2017-07-23 LAB — RAPID URINE DRUG SCREEN, HOSP PERFORMED
AMPHETAMINES: NOT DETECTED
Barbiturates: NOT DETECTED
Benzodiazepines: NOT DETECTED
COCAINE: NOT DETECTED
OPIATES: NOT DETECTED
Tetrahydrocannabinol: NOT DETECTED

## 2017-07-23 LAB — CBC
HCT: 42 % (ref 39.0–52.0)
HEMOGLOBIN: 14.2 g/dL (ref 13.0–17.0)
MCH: 30.7 pg (ref 26.0–34.0)
MCHC: 33.8 g/dL (ref 30.0–36.0)
MCV: 90.7 fL (ref 78.0–100.0)
Platelets: 214 10*3/uL (ref 150–400)
RBC: 4.63 MIL/uL (ref 4.22–5.81)
RDW: 13.7 % (ref 11.5–15.5)
WBC: 8.6 10*3/uL (ref 4.0–10.5)

## 2017-07-23 LAB — PROTIME-INR
INR: 2.35
Prothrombin Time: 25.5 seconds — ABNORMAL HIGH (ref 11.4–15.2)

## 2017-07-23 LAB — ETHANOL: Alcohol, Ethyl (B): <10 mg/dL (ref <10)

## 2017-07-23 LAB — I-STAT TROPONIN, ED: Troponin i, poc: 0.01 ng/mL (ref 0.00–0.08)

## 2017-07-23 NOTE — ED Provider Notes (Signed)
Patient placed in Quick Look pathway, seen and evaluated   Chief Complaint: vision loss.   HPI:   84 y.o. male with a history of hyperlipidemia, A. fib on warfarin who presents emergency department today for lower visual field loss of the right eye.  Patient states that sometime during the morning prior to 12 PM he noticed a loss of his lower vision in his right eye.  He was seen by his ophthalmologist, Dr. Luberta Mutter of ophthalmology who sent the patient over for further evaluation of a stroke.  Patient denies any headache, head trauma, diplopia, tinnitus, hearing changes, facial droop, aphasia, difficulty with speech, focal weakness, neck pain, numbness/tingling/weakness of the extremities, falls, loss of consciousness, nausea/vomiting, chest pain, shortness breath, or alcohol/drug use.  No new medications.  ROS:  Positive ROS: (+) visual field loss Negative ROS: (-) headache, head trauma, diplopia, tinnitus, hearing changes, facial droop, aphasia, difficulty with speech, focal weakness, neck pain, numbness/tingling/weakness of the extremities, falls, loss of consciousness, nausea/vomiting, chest pain, shortness breath, or alcohol/drug use.   Physical Exam:   Gen: No distress  Neuro: Awake and Alert  Skin: Warm  Focused Exam: Neuro:  Speech clear. Follows commands. No facial droop.  EOMI. Reported loss of vision in right lower field.   Cranial Nerves:   III,IV, VI: ptosis not present, extra-ocular motions intact bilaterally   V,VII: smile symmetric, facial light touch sensation equal  VIII: hearing grossly normal bilaterally   IX,X: midline uvula rise   XI: bilateral shoulder shrug equal and strong  XII: midline tongue extension  Grossly moves all extremities 4 without ataxia. Coordination intact. Able and  appropriate strength for age to upper and lower extremities bilaterally including  grip strength & plantar flexion/dorsiflexion. Sensation to light touch intact  bilaterally for  upper and lower. Patellar deep tendon reflex 2+ and equal  bilaterally. Normal finger to nose. No pronator drift. Able gait.   BP 107/64 (BP Location: Right Arm)   Pulse 99   Temp 98.2 F (36.8 C) (Oral)   Resp 16   SpO2 99%    Large Artery Stroke Screening     Weakness:  Patient shows no weakness.    Vision:  [employer] Cut  Aphasia:  NONE  Neglect:  NONE:  VAN =  Negative  If patient has any weakness PLUS any one of the below: Visual Disturbance (field cut, double, or blind vision) Aphasia (inability to speak or understand) Neglect (gaze to one side or ignoring one side) This is likely a large artery clot (cortical symptoms) = VAN Positive  Plan: Patient is outside window for TPA therapy. He is VAN negative. Based on initial evaluation, labs ARE indicated and radiology studies ARE indicated.  Patient counseled on process, plan, and necessity for staying for completing the evaluation."  Initiation of care has begun. The patient has been counseled on the process, plan, and necessity for staying for the completion/evaluation, and the remainder of the medical screening examination    Lorelle Gibbs 07/23/17 1735    Orlie Dakin, MD 07/24/17 [phone]

## 2017-07-23 NOTE — ED Triage Notes (Addendum)
Pt send in to ED from ophthalmology office for stroke work up d/t visual changes in right eye. Pt reports vision is wavy in lower field of right eye. Denies blurred vision, ha, weakness. VAN negative. Pt states this problem began this morning before lunch, but unsure of exact time.

## 2017-07-24 ENCOUNTER — Other Ambulatory Visit: Payer: Self-pay

## 2017-07-24 ENCOUNTER — Encounter (HOSPITAL_COMMUNITY): Payer: Self-pay | Admitting: Radiology

## 2017-07-24 ENCOUNTER — Other Ambulatory Visit (HOSPITAL_COMMUNITY): Payer: Medicare Other

## 2017-07-24 ENCOUNTER — Inpatient Hospital Stay (HOSPITAL_COMMUNITY): Payer: Medicare Other

## 2017-07-24 ENCOUNTER — Emergency Department (HOSPITAL_COMMUNITY): Payer: Medicare Other

## 2017-07-24 DIAGNOSIS — H3411 Central retinal artery occlusion, right eye: Secondary | ICD-10-CM | POA: Diagnosis not present

## 2017-07-24 DIAGNOSIS — I493 Ventricular premature depolarization: Secondary | ICD-10-CM | POA: Diagnosis present

## 2017-07-24 DIAGNOSIS — Z9841 Cataract extraction status, right eye: Secondary | ICD-10-CM | POA: Diagnosis not present

## 2017-07-24 DIAGNOSIS — I5022 Chronic systolic (congestive) heart failure: Secondary | ICD-10-CM | POA: Diagnosis not present

## 2017-07-24 DIAGNOSIS — N400 Enlarged prostate without lower urinary tract symptoms: Secondary | ICD-10-CM | POA: Diagnosis present

## 2017-07-24 DIAGNOSIS — Z01818 Encounter for other preprocedural examination: Secondary | ICD-10-CM | POA: Diagnosis not present

## 2017-07-24 DIAGNOSIS — Z7901 Long term (current) use of anticoagulants: Secondary | ICD-10-CM | POA: Diagnosis not present

## 2017-07-24 DIAGNOSIS — I6521 Occlusion and stenosis of right carotid artery: Secondary | ICD-10-CM | POA: Diagnosis not present

## 2017-07-24 DIAGNOSIS — H538 Other visual disturbances: Secondary | ICD-10-CM | POA: Diagnosis not present

## 2017-07-24 DIAGNOSIS — H5461 Unqualified visual loss, right eye, normal vision left eye: Secondary | ICD-10-CM | POA: Diagnosis not present

## 2017-07-24 DIAGNOSIS — Z87891 Personal history of nicotine dependence: Secondary | ICD-10-CM | POA: Diagnosis not present

## 2017-07-24 DIAGNOSIS — I420 Dilated cardiomyopathy: Secondary | ICD-10-CM | POA: Diagnosis not present

## 2017-07-24 DIAGNOSIS — I513 Intracardiac thrombosis, not elsewhere classified: Secondary | ICD-10-CM

## 2017-07-24 DIAGNOSIS — Z950 Presence of cardiac pacemaker: Secondary | ICD-10-CM | POA: Diagnosis not present

## 2017-07-24 DIAGNOSIS — M353 Polymyalgia rheumatica: Secondary | ICD-10-CM

## 2017-07-24 DIAGNOSIS — I351 Nonrheumatic aortic (valve) insufficiency: Secondary | ICD-10-CM | POA: Diagnosis not present

## 2017-07-24 DIAGNOSIS — Z79899 Other long term (current) drug therapy: Secondary | ICD-10-CM | POA: Diagnosis not present

## 2017-07-24 DIAGNOSIS — I509 Heart failure, unspecified: Secondary | ICD-10-CM | POA: Diagnosis not present

## 2017-07-24 DIAGNOSIS — H3410 Central retinal artery occlusion, unspecified eye: Secondary | ICD-10-CM | POA: Diagnosis present

## 2017-07-24 DIAGNOSIS — E785 Hyperlipidemia, unspecified: Secondary | ICD-10-CM | POA: Diagnosis present

## 2017-07-24 DIAGNOSIS — I480 Paroxysmal atrial fibrillation: Secondary | ICD-10-CM

## 2017-07-24 DIAGNOSIS — I110 Hypertensive heart disease with heart failure: Secondary | ICD-10-CM | POA: Diagnosis present

## 2017-07-24 DIAGNOSIS — Z7952 Long term (current) use of systemic steroids: Secondary | ICD-10-CM | POA: Diagnosis not present

## 2017-07-24 DIAGNOSIS — Z9842 Cataract extraction status, left eye: Secondary | ICD-10-CM | POA: Diagnosis not present

## 2017-07-24 DIAGNOSIS — I77810 Thoracic aortic ectasia: Secondary | ICD-10-CM | POA: Diagnosis present

## 2017-07-24 DIAGNOSIS — I6523 Occlusion and stenosis of bilateral carotid arteries: Secondary | ICD-10-CM | POA: Diagnosis not present

## 2017-07-24 DIAGNOSIS — I739 Peripheral vascular disease, unspecified: Secondary | ICD-10-CM | POA: Diagnosis present

## 2017-07-24 DIAGNOSIS — I481 Persistent atrial fibrillation: Secondary | ICD-10-CM | POA: Diagnosis present

## 2017-07-24 DIAGNOSIS — I482 Chronic atrial fibrillation: Secondary | ICD-10-CM | POA: Diagnosis not present

## 2017-07-24 DIAGNOSIS — I2510 Atherosclerotic heart disease of native coronary artery without angina pectoris: Secondary | ICD-10-CM | POA: Diagnosis present

## 2017-07-24 DIAGNOSIS — K219 Gastro-esophageal reflux disease without esophagitis: Secondary | ICD-10-CM | POA: Diagnosis present

## 2017-07-24 DIAGNOSIS — I428 Other cardiomyopathies: Secondary | ICD-10-CM | POA: Diagnosis present

## 2017-07-24 LAB — LIPID PANEL
CHOLESTEROL: 130 mg/dL (ref 0–200)
HDL: 45 mg/dL (ref >40)
LDL CALC: 76 mg/dL (ref 0–99)
Total CHOL/HDL Ratio: 2.9 RATIO
Triglycerides: 47 mg/dL (ref <150)
VLDL: 9 mg/dL (ref 0–40)

## 2017-07-24 LAB — SEDIMENTATION RATE: Sed Rate: 2 mm/hr (ref 0–16)

## 2017-07-24 LAB — C-REACTIVE PROTEIN

## 2017-07-24 LAB — HEMOGLOBIN A1C
Hgb A1c MFr Bld: 5.6 % (ref 4.8–5.6)
Mean Plasma Glucose: 114.02 mg/dL

## 2017-07-24 LAB — PROTIME-INR
INR: 2.16
PROTHROMBIN TIME: 23.9 s — AB (ref 11.4–15.2)

## 2017-07-24 MED ORDER — SODIUM CHLORIDE 0.9 % IV SOLN: 250.0000 mL | INTRAVENOUS | Status: DC | PRN

## 2017-07-24 MED ORDER — POTASSIUM CHLORIDE CRYS ER 20 MEQ PO TBCR
20.0000 meq | EXTENDED_RELEASE_TABLET | Freq: Two times a day (BID) | ORAL | Status: DC
  Administered 2017-07-24 – 2017-07-27 (×6): 20 meq via ORAL
  Filled 2017-07-24 (×6): qty 1

## 2017-07-24 MED ORDER — PRAVASTATIN SODIUM 40 MG PO TABS: 40.0000 mg | ORAL_TABLET | Freq: Every day | ORAL | Status: DC

## 2017-07-24 MED ORDER — STROKE: EARLY STAGES OF RECOVERY BOOK
Freq: Once | Status: DC
  Filled 2017-07-24: qty 1

## 2017-07-24 MED ORDER — POLYVINYL ALCOHOL 1.4 % OP SOLN
1.0000 [drp] | Freq: Every day | OPHTHALMIC | Status: DC | PRN
  Filled 2017-07-24: qty 15

## 2017-07-24 MED ORDER — SPIRONOLACTONE 25 MG PO TABS
25.0000 mg | ORAL_TABLET | Freq: Every day | ORAL | Status: DC
  Administered 2017-07-25 – 2017-07-27 (×2): 25 mg via ORAL
  Filled 2017-07-24 (×2): qty 1

## 2017-07-24 MED ORDER — IOPAMIDOL (ISOVUE-370) INJECTION 76%
INTRAVENOUS | Status: AC
  Filled 2017-07-24: qty 50

## 2017-07-24 MED ORDER — HYPROMELLOSE (GONIOSCOPIC) 2.5 % OP SOLN: 1.0000 [drp] | Freq: Every day | OPHTHALMIC | Status: DC | PRN

## 2017-07-24 MED ORDER — WARFARIN SODIUM 7.5 MG PO TABS: 7.5000 mg | ORAL_TABLET | Freq: Once | ORAL | Status: DC

## 2017-07-24 MED ORDER — FUROSEMIDE 80 MG PO TABS
80.0000 mg | ORAL_TABLET | Freq: Every day | ORAL | Status: DC
  Administered 2017-07-24 – 2017-07-27 (×3): 80 mg via ORAL
  Filled 2017-07-24 (×2): qty 1
  Filled 2017-07-24: qty 4

## 2017-07-24 MED ORDER — ACETAMINOPHEN 325 MG PO TABS: 650.0000 mg | ORAL_TABLET | Freq: Four times a day (QID) | ORAL | Status: DC | PRN

## 2017-07-24 MED ORDER — PREDNISONE 1 MG PO TABS
1.0000 mg | ORAL_TABLET | Freq: Every day | ORAL | Status: DC
  Administered 2017-07-25 – 2017-07-27 (×2): 1 mg via ORAL
  Filled 2017-07-24 (×3): qty 1

## 2017-07-24 MED ORDER — WARFARIN - PHARMACIST DOSING INPATIENT: Freq: Every day | Status: DC

## 2017-07-24 MED ORDER — SODIUM CHLORIDE 0.9% FLUSH
3.0000 mL | Freq: Two times a day (BID) | INTRAVENOUS | Status: DC
  Administered 2017-07-24 – 2017-07-26 (×4): 3 mL via INTRAVENOUS

## 2017-07-24 MED ORDER — STROKE: EARLY STAGES OF RECOVERY BOOK
Freq: Once | Status: AC
  Administered 2017-07-24: 23:00:00
  Filled 2017-07-24: qty 1

## 2017-07-24 MED ORDER — SODIUM CHLORIDE 0.9% FLUSH: 3.0000 mL | INTRAVENOUS | Status: DC | PRN

## 2017-07-24 MED ORDER — ACETAMINOPHEN 650 MG RE SUPP: 650.0000 mg | Freq: Four times a day (QID) | RECTAL | Status: DC | PRN

## 2017-07-24 MED ORDER — IOPAMIDOL (ISOVUE-370) INJECTION 76%
50.0000 mL | Freq: Once | INTRAVENOUS | Status: AC | PRN
  Administered 2017-07-24: 50 mL via INTRAVENOUS

## 2017-07-24 MED ORDER — WARFARIN SODIUM 5 MG PO TABS
5.0000 mg | ORAL_TABLET | Freq: Once | ORAL | Status: AC
  Administered 2017-07-24: 5 mg via ORAL
  Filled 2017-07-24 (×2): qty 1

## 2017-07-24 MED ORDER — METOPROLOL SUCCINATE ER 25 MG PO TB24
12.5000 mg | ORAL_TABLET | Freq: Every day | ORAL | Status: DC
  Administered 2017-07-25: 12.5 mg via ORAL
  Filled 2017-07-24 (×7): qty 1

## 2017-07-24 MED ORDER — ATORVASTATIN CALCIUM 80 MG PO TABS
80.0000 mg | ORAL_TABLET | Freq: Every day | ORAL | Status: DC
  Administered 2017-07-24 – 2017-07-26 (×3): 80 mg via ORAL
  Filled 2017-07-24 (×3): qty 1

## 2017-07-24 MED ORDER — FLUTICASONE PROPIONATE 50 MCG/ACT NA SUSP
2.0000 | Freq: Every day | NASAL | Status: DC | PRN
  Filled 2017-07-24: qty 16

## 2017-07-24 MED ORDER — TAMSULOSIN HCL 0.4 MG PO CAPS
0.4000 mg | ORAL_CAPSULE | Freq: Every day | ORAL | Status: DC
  Administered 2017-07-24 – 2017-07-25 (×2): 0.4 mg via ORAL
  Filled 2017-07-24 (×3): qty 1

## 2017-07-24 NOTE — ED Provider Notes (Signed)
Medical screening examination/treatment/procedure(s) were conducted as a shared visit with non-physician practitioner(s) and myself.  I personally evaluated the patient during the encounter.  EKG Interpretation  Date/Time:  Friday Jul 23 2017 17:16:48 EDT Ventricular Rate:  77 PR Interval:    QRS Duration: 154 QT Interval:  446 QTC Calculation: 504 R Axis:   -94 Text Interpretation:  Ventricular-paced rhythm with occasional supraventricular complexes and with occasional and consecutive Premature ventricular complexes Abnormal ECG Confirmed by Pryor Curia [phone]) on 07/24/2017 7:30:15 AM   Patient is 84 year old male who presents to the emergency department with central right vision loss.  Was seen by his ophthalmologist Dr. Thomasene Lot and diagnosed with a central retinal artery occlusion.  Patient is already on Coumadin and is therapeutic at this time.  Sent here for further evaluation and to work-up for stroke.  Neurology has been consulted.  Recommend CT angio of the head and neck.  Medicine to admit.   [name], Delice Bison, DO 07/24/17 2312

## 2017-07-24 NOTE — ED Notes (Signed)
Per MRI, unable to scan pt due to Pacemaker.

## 2017-07-24 NOTE — ED Notes (Signed)
Pt at bedside eating lunch trray

## 2017-07-24 NOTE — Discharge Instructions (Addendum)
[facility]  Discharge Instructions   Carotid Endarterectomy (CEA)  Please refer to the following instructions for your post-procedure care. Your surgeon or physician assistant will discuss any changes with you.  Activity  You are encouraged to walk as much as you can. You can slowly return to normal activities but must avoid strenuous activity and heavy lifting until your doctor tell you it's OK. Avoid activities such as vacuuming or swinging a golf club. You can drive after one week if you are comfortable and you are no longer taking prescription pain medications. It is normal to feel tired for serval weeks after your surgery. It is also normal to have difficulty with sleep habits, eating, and bowel movements after surgery. These will go away with time.  Bathing/Showering  You may shower after you come home. Do not soak in a bathtub, hot tub, or swim until the incision heals completely.  Incision Care  Shower every day. Clean your incision with mild soap and water. Pat the area dry with a clean towel. You do not need a bandage unless otherwise instructed. Do not apply any ointments or creams to your incision. You may have skin glue on your incision. Do not peel it off. It will come off on its own in about one week. Your incision may feel thickened and raised for several weeks after your surgery. This is normal and the skin will soften over time. For Men Only: It's OK to shave around the incision but do not shave the incision itself for 2 weeks. It is common to have numbness under your chin that could last for several months.  Diet  Resume your normal diet. There are no special food restrictions following this procedure. A low fat/low cholesterol diet is recommended for all patients with vascular disease. In order to heal from your surgery, it is CRITICAL to get adequate nutrition. Your body requires vitamins, minerals, and protein. Vegetables are the best  source of vitamins and minerals. Vegetables also provide the perfect balance of protein. Processed food has little nutritional value, so try to avoid this.        Medications  Resume taking all of your medications unless your doctor or physician assistant tells you not to. If your incision is causing pain, you may take over-the- counter pain relievers such as acetaminophen (Tylenol). If you were prescribed a stronger pain medication, please be aware these medications can cause nausea and constipation. Prevent nausea by taking the medication with a snack or meal. Avoid constipation by drinking plenty of fluids and eating foods with a high amount of fiber, such as fruits, vegetables, and grains. Do not take Tylenol if you are taking prescription pain medications.  Follow Up  Our office will schedule a follow up appointment 2-3 weeks following discharge.  Please call us immediately for any of the following conditions  Increased pain, redness, drainage (pus) from your incision site. Fever of 101 degrees or higher. If you should develop stroke (slurred speech, difficulty swallowing, weakness on one side of your body, loss of vision) you should call 911 and go to the nearest emergency room.  Reduce your risk of vascular disease:  Stop smoking. If you would like help call [facility] at 1-800-QUIT-NOW 418-808-0750) or [place] at [phone]. Manage your cholesterol Maintain a desired weight Control your diabetes Keep your blood pressure down  If you have any questions, please call the office at [phone]. Information on my medicine - Coumadin   (  Warfarin)  This medication education was reviewed with me or my healthcare representative as part of my discharge preparation.  The pharmacist that spoke with me during my hospital stay was:  Saundra Shelling, [facility]  Why was Coumadin prescribed for you? Coumadin was prescribed for you because you have a blood clot or a medical condition  that can cause an increased risk of forming blood clots. Blood clots can cause serious health problems by blocking the flow of blood to the heart, lung, or brain. Coumadin can prevent harmful blood clots from forming. As a reminder your indication for Coumadin is:   Stroke Prevention Because Of Atrial Fibrillation  What test will check on my response to Coumadin? While on Coumadin (warfarin) you will need to have an INR test regularly to ensure that your dose is keeping you in the desired range. The INR (international normalized ratio) number is calculated from the result of the laboratory test called prothrombin time (PT).  If an INR APPOINTMENT HAS NOT ALREADY BEEN MADE FOR YOU please schedule an appointment to have this lab work done by your health care provider within 7 days. Your INR goal is usually a number between:  2 to 3 or your provider may give you a more narrow range like 2-2.5.  Ask your health care provider during an office visit what your goal INR is.  What  do you need to  know  About  COUMADIN? Take Coumadin (warfarin) exactly as prescribed by your healthcare provider about the same time each day.  DO NOT stop taking without talking to the doctor who prescribed the medication.  Stopping without other blood clot prevention medication to take the place of Coumadin may increase your risk of developing a new clot or stroke.  Get refills before you run out.  What do you do if you miss a dose? If you miss a dose, take it as soon as you remember on the same day then continue your regularly scheduled regimen the next day.  Do not take two doses of Coumadin at the same time.  Important Safety Information A possible side effect of Coumadin (Warfarin) is an increased risk of bleeding. You should call your healthcare provider right away if you experience any of the following: ? Bleeding from an injury or your nose that does not stop. ? Unusual colored urine (red or dark brown) or unusual  colored stools (red or black). ? Unusual bruising for unknown reasons. ? A serious fall or if you hit your head (even if there is no bleeding).  Some foods or medicines interact with Coumadin (warfarin) and might alter your response to warfarin. To help avoid this: ? Eat a balanced diet, maintaining a consistent amount of Vitamin K. ? Notify your provider about major diet changes you plan to make. ? Avoid alcohol or limit your intake to 1 drink for women and 2 drinks for men per day. (1 drink is 5 oz. wine, 12 oz. beer, or 1.5 oz. liquor.)  Make sure that ANY health care provider who prescribes medication for you knows that you are taking Coumadin (warfarin).  Also make sure the healthcare provider who is monitoring your Coumadin knows when you have started a new medication including herbals and non-prescription products.  Coumadin (Warfarin)  Major Drug Interactions  Increased Warfarin Effect Decreased Warfarin Effect  Alcohol (large quantities) Antibiotics (esp. Septra/Bactrim, Flagyl, Cipro) Amiodarone (Cordarone) Aspirin (ASA) Cimetidine (Tagamet) Megestrol (Megace) NSAIDs (ibuprofen, naproxen, etc.) Piroxicam (Feldene) Propafenone (Rythmol SR)  Propranolol (Inderal) Isoniazid (INH) Posaconazole (Noxafil) Barbiturates (Phenobarbital) Carbamazepine (Tegretol) Chlordiazepoxide (Librium) Cholestyramine (Questran) Griseofulvin Oral Contraceptives Rifampin Sucralfate (Carafate) Vitamin K   Coumadin (Warfarin) Major Herbal Interactions  Increased Warfarin Effect Decreased Warfarin Effect  Garlic Ginseng Ginkgo biloba Coenzyme Q10 Green tea St. Johns wort    Coumadin (Warfarin) FOOD Interactions  Eat a consistent number of servings per week of foods HIGH in Vitamin K (1 serving =  cup)  Collards (cooked, or boiled & drained) Kale (cooked, or boiled & drained) Mustard greens (cooked, or boiled & drained) Parsley *serving size only =  cup Spinach (cooked, or boiled  & drained) Swiss chard (cooked, or boiled & drained) Turnip greens (cooked, or boiled & drained)  Eat a consistent number of servings per week of foods MEDIUM-HIGH in Vitamin K (1 serving = 1 cup)  Asparagus (cooked, or boiled & drained) Broccoli (cooked, boiled & drained, or raw & chopped) Brussel sprouts (cooked, or boiled & drained) *serving size only =  cup Lettuce, raw (green leaf, endive, romaine) Spinach, raw Turnip greens, raw & chopped   These websites have more information on Coumadin (warfarin):  FailFactory.se; VeganReport.com.au;

## 2017-07-24 NOTE — Progress Notes (Signed)
Patient ID: [name], male   DOB: Jul 26, 1929, 84 y.o.   MRN: [id number],                                      [facility]  Patient name: [name] MRN: [id number] DOB: 1929-10-22 Sex: male  REASON FOR CONSULT: Right retinal artery occlusion with high-grade right internal carotid artery stenosis  HPI: [name] is a 84 y.o. male, who is seen for evaluation of severe right internal carotid artery stenosis.  He is a very pleasant gentleman who yesterday had sudden onset of vision changes with field loss in his right eye.  He reported central vision in lower fields were not with vision present.  He saw his ophthalmologist to diagnosed a central retinal artery occlusion and sent him to the emergency department.  He is extremely active.  He had no prior neurologic deficits and had no prior episodes of amaurosis fugax or transient ischemic attack or stroke.  He does have a severe cardiomyopathy with pacemaker and multiple ablations for atrial fibrillation in the past.  Past Medical History:  Diagnosis Date  . Arthritis    "back" (03/20/2014)  . Atrial fibrillation ([place])   . BPH (benign prostatic hypertrophy)   . Breast mass    "on both sides" (06/28/2012)  . Cardiomyopathy primary-nonischemic EF 45%  . CHF (congestive heart failure) ([place])   . Coronary artery disease   . Diverticulosis of colon without hemorrhage 01/03/2014  . Dysphagia   . Elevated liver enzymes   . Esophageal stricture   . GERD (gastroesophageal reflux disease)   . Hearing loss, mixed, bilateral   . Hx of cardiovascular stress test    Adenosine Myoview (07/2013):  Apical cap, apical lateral and mid anterolateral scar, small amount of peri-infarct ischemia, EF 36%; Medium Risk  . Hyperlipidemia   . Increased prostate specific antigen (PSA) velocity   . Internal hemorrhoids 01/03/2014  . Lumbar back pain   . Pacemaker    st jude  . Pacemaker infection ([place])    06/28/12    . Polymyalgia rheumatica ([place])   . Sick sinus syndrome ([facility])     Family History  Problem Relation Age of Onset  . Heart attack Father 25       deceased  . AAA (abdominal aortic aneurysm) Mother        deceased AAA  . Hypertension Mother   . Other Brother        deceased at birth  . Healthy Son   . Healthy Daughter   . Diabetes Cousin     SOCIAL HISTORY: Social History   Socioeconomic History  . Marital status: Married    Spouse name: Not on file  . Number of children: 3  . Years of education: Not on file  . Highest education level: Not on file  Occupational History  . Occupation: retired    Fish farm manager: RETIRED    Comment: Engineering geologist  Social Needs  . Financial resource strain: Not on file  . Food insecurity:    Worry: Not on file    Inability: Not on file  . Transportation needs:    Medical: Not on file    Non-medical: Not on file  Tobacco Use  . Smoking status: Former Smoker    Packs/day: 1.00    Years: 40.00    Pack years: 40.00  Types: Cigarettes    Last attempt to quit: 03/10/1983    Years since quitting: 34.3  . Smokeless tobacco: Never Used  Substance and Sexual Activity  . Alcohol use: No    Alcohol/week: 0.0 oz    Comment: quit drinking 2000-? alcohol related cardiomyopathy  . Drug use: No  . Sexual activity: Not Currently  Lifestyle  . Physical activity:    Days per week: Not on file    Minutes per session: Not on file  . Stress: Not on file  Relationships  . Social connections:    Talks on phone: Not on file    Gets together: Not on file    Attends religious service: Not on file    Active member of club or organization: Not on file    Attends meetings of clubs or organizations: Not on file    Relationship status: Not on file  . Intimate partner violence:    Fear of current or ex partner: Not on file    Emotionally abused: Not on file    Physically abused: Not on file    Forced sexual activity: Not on file  Other Topics  Concern  . Not on file  Social History Narrative   Married. 3 children (6 together). 5 grandkids with with 5 grandkids (2nd marriage). No greatgrandkids.    One story home.   Retired from Financial planner   Education: college   Hobbies: play golf, bridge, garden, write family stories    Allergies  Allergen Reactions  . Antihistamines, Diphenhydramine-Type Other (See Comments)    Causes difficulty in ability to urinate.    Current Facility-Administered Medications  Medication Dose Route Frequency Provider Last Rate Last Dose  .  stroke: mapping our Erian Lariviere stages of recovery book   Does not apply Once Samuella Cota, MD      . 0.9 %  sodium chloride infusion  250 mL Intravenous PRN Samuella Cota, MD      . acetaminophen (TYLENOL) tablet 650 mg  650 mg Oral Q6H PRN Samuella Cota, MD       Or  . acetaminophen (TYLENOL) suppository 650 mg  650 mg Rectal Q6H PRN Samuella Cota, MD      . atorvastatin (LIPITOR) tablet 80 mg  80 mg Oral q1800 Nyanor, Pearl O, NP   80 mg at 07/24/17 1941  . fluticasone (FLONASE) 50 MCG/ACT nasal spray 2 spray  2 spray Each Nare Daily PRN Samuella Cota, MD      . furosemide (LASIX) tablet 80 mg  80 mg Oral Daily Samuella Cota, MD   80 mg at 07/24/17 1145  . metoprolol succinate (TOPROL-XL) 24 hr tablet 12.5 mg  12.5 mg Oral Daily Samuella Cota, MD      . polyvinyl alcohol (LIQUIFILM TEARS) 1.4 % ophthalmic solution 1 drop  1 drop Both Eyes Daily PRN Samuella Cota, MD      . potassium chloride SA (K-DUR,KLOR-CON) CR tablet 20 mEq  20 mEq Oral BID Samuella Cota, MD   20 mEq at 07/24/17 1146  . [START ON 07/25/2017] predniSONE (DELTASONE) tablet 1 mg  1 mg Oral Q breakfast Samuella Cota, MD      . sodium chloride flush (NS) 0.9 % injection 3 mL  3 mL Intravenous Q12H Samuella Cota, MD      . sodium chloride flush (NS) 0.9 % injection 3 mL  3 mL Intravenous PRN Samuella Cota, MD      .  spironolactone (ALDACTONE) tablet 25 mg  25 mg Oral Daily Samuella Cota, MD      . tamsulosin [facility]) capsule 0.4 mg  0.4 mg Oral Daily Samuella Cota, MD   0.4 mg at 07/24/17 1146  . Warfarin - Pharmacist Dosing Inpatient   Does not apply q1800 Tyrone Apple, [facility]        REVIEW OF SYSTEMS:  Reviewed and his history and physical with nothing to add  PHYSICAL EXAM: Vitals:   07/24/17 1548 07/24/17 1600 07/24/17 1800 07/24/17 2002  BP: 108/73 114/66  126/78  Pulse: 74 75 75 70  Resp: 19 17 18 18   Temp:      TempSrc:      SpO2: 95% 96% 95% 100%    GENERAL: The patient is a well-nourished male, in no acute distress. The vital signs are documented above. CARDIOVASCULAR: 2+ radial and 2+ dorsalis pedis pulses bilaterally PULMONARY: There is good air exchange  ABDOMEN: Soft and non-tender  MUSCULOSKELETAL: There are no major deformities or cyanosis. NEUROLOGIC: No focal weakness or paresthesias are detected. SKIN: There are no ulcers or rashes noted. PSYCHIATRIC: The patient has a normal affect.  DATA:  I reviewed his CT angiogram.  This shows extremely irregular high-grade at least 90% stenosis of his right internal carotid artery.  He has bifurcation in the mid neck.  No significant narrowing on the left internal carotid artery.  The radiologist commented on nonopacification of the left atrial appendage with possible thrombus present  MEDICAL ISSUES: Right retinal artery occlusion most likely from his high-grade irregular right internal carotid artery stenosis.  Discussed this at length with the patient and recommended right carotid endarterectomy for reduction of stroke risk.  He is to have echocardiogram and possible transesophageal echocardiogram for further evaluation of his heart.  We will also need cardiology input for preoperative management of his anticoagulation.  He is on chronic Coumadin therapy for his atrial fibrillation and is therapeutic at an INR of 2.35 on  admission.  I explained the procedure of carotid endarterectomy in detail with the patient and discussed potential risks to include 1 to 2% risk of stroke and a very slight risk of cranial nerve injury.  We will coordinate timing of endarterectomy depending on his cardiac work-up   Rosetta Posner, MD [facility] Tel [phone] Pager [phone]

## 2017-07-24 NOTE — Consult Note (Addendum)
Referring Physician: Samuella Cota, MD    Chief Complaint: Visual Field Change  HPI: [name] is an 84 y.o. male with an extensive cardiac history of atrial fibrillation, sick sinus syndrome status post pacemaker implantation and dependency, chronic systolic CHF, CAD, dyslipidemia, polymyalgia algia rheumatica who presents to the ED with acute, painless right lower field visual loss beginning around 12 PM on 07/23/2017.  Patient states he acutely noted as if the bottom half of his right eye has been blocked out.  He denies associated headache, dizziness, ocular pain, recent infections, nausea, vomiting, extremity weakness, facial droop, recent eye injury, chest pain associated with the symptoms.  He immediately presented to the ophthalmologist office - Luberta Mutter of [place].  After assessing patients that they diagnosed him with a retinal artery occlusion of his right eye.  He immediately referred patient to the ER for further evaluation.  While in the ED patient states symptoms are  Progressively improving.  Initial CT of the head on admission to the ER was unremarkable, CTA of the head and neck showed a high-grade stenosis of the right ICA-70% as well as left atrial appendage probably clot.  Hospitalist was consulted for further evaluation.  On assessment patient patient did not have any acute focal neuro deficits.  Assessment of visual field the patient continued to have some loss only in the mid portion of his right eye, with peripheral vision intact on either side and above and below eye level.  Continues to denies any associated headaches or neuro symptoms, answers questions appropriately.  Patient admits to history of A. fib and sick sinus syndrome status post pacemaker implantation which is currently dependent on.  Patient had AV Junction node ablation in February 2018,  last noted pacemaker interrogation on 09/02/2016 was within normal limits with no further  recommendations and last TEE on 11/07/2015 did not show evidence of atrial/apical thrombus.   LSN:07/23/17 1200pm  tPA Given: No: No LVO Premorbid modified Rankin scale (mRS): 0  Past Medical History:  Diagnosis Date  . Arthritis    "back" (03/20/2014)  . Atrial fibrillation ([place])   . BPH (benign prostatic hypertrophy)   . Breast mass    "on both sides" (06/28/2012)  . Cardiomyopathy primary-nonischemic EF 45%  . CHF (congestive heart failure) ([place])   . Coronary artery disease   . Diverticulosis of colon without hemorrhage 01/03/2014  . Dysphagia   . Elevated liver enzymes   . Esophageal stricture   . GERD (gastroesophageal reflux disease)   . Hearing loss, mixed, bilateral   . Hx of cardiovascular stress test    Adenosine Myoview (07/2013):  Apical cap, apical lateral and mid anterolateral scar, small amount of peri-infarct ischemia, EF 36%; Medium Risk  . Hyperlipidemia   . Increased prostate specific antigen (PSA) velocity   . Internal hemorrhoids 01/03/2014  . Lumbar back pain   . Pacemaker    st jude  . Pacemaker infection ([place])    06/28/12  . Polymyalgia rheumatica ([place])   . Sick sinus syndrome [facility])     Past Surgical History:  Procedure Laterality Date  . AV NODE ABLATION N/A 04/20/2016   Procedure: AV Node Ablation;  Surgeon: Evans Lance, MD;  Location: [place] [facility];  Service: Cardiovascular;  Laterality: N/A;  . CARDIAC CATHETERIZATION  1980's   [place]  . CARDIOVERSION  06/17/2011   Procedure: CARDIOVERSION;  Surgeon: Lelon Perla, MD;  Location: [place];  Service: Cardiovascular;  Laterality: N/A;  .  CARDIOVERSION  09/09/2011   Procedure: CARDIOVERSION;  Surgeon: Carlena Bjornstad, MD;  Location: [place];  Service: Cardiovascular;  Laterality: N/A;  . CARDIOVERSION  01/01/2012   Procedure: CARDIOVERSION;  Surgeon: Hillary Bow, MD;  Location: [facility] ENDOSCOPY;  Service: Cardiovascular;  Laterality: N/A;  . CARDIOVERSION N/A 08/03/2014   Procedure:  CARDIOVERSION;  Surgeon: Larey Dresser, MD;  Location: [facility] ENDOSCOPY;  Service: Cardiovascular;  Laterality: N/A;  . CARDIOVERSION N/A 11/07/2015   Procedure: CARDIOVERSION;  Surgeon: Larey Dresser, MD;  Location: [facility] ENDOSCOPY;  Service: Cardiovascular;  Laterality: N/A;  . CARDIOVERSION N/A 11/20/2015   Procedure: CARDIOVERSION;  Surgeon: Larey Dresser, MD;  Location: [place];  Service: Cardiovascular;  Laterality: N/A;  . CATARACT EXTRACTION, BILATERAL Bilateral 1980's  . COLONOSCOPY N/A 01/03/2014   Procedure: COLONOSCOPY;  Surgeon: Gatha Mayer, MD;  Location: [facility] ENDOSCOPY;  Service: Endoscopy;  Laterality: N/A;  . ESOPHAGOGASTRODUODENOSCOPY    . GENERATOR REMOVAL Left 06/15/2012   Procedure: GENERATOR REMOVAL;  Surgeon: Evans Lance, MD;  Location: [place];  Service: Cardiovascular;  Laterality: Left;  . HEMORRHOID BANDING    . INGUINAL HERNIA REPAIR Right 2012; 03/20/2014  . INGUINAL HERNIA REPAIR Right 03/20/2014   Procedure: OPEN REPAIR OF RIGHT INGUINAL WITH MESH;  Surgeon: Donnie Mesa, MD;  Location: [place];  Service: General;  Laterality: Right;  . INSERT / REPLACE / REMOVE PACEMAKER    . INSERTION OF MESH Right 03/20/2014   Procedure: INSERTION OF MESH;  Surgeon: Donnie Mesa, MD;  Location: [place];  Service: General;  Laterality: Right;  . KNEE ARTHROSCOPY WITH LATERAL MENISECTOMY Left 12/16/2016   Procedure: KNEE ARTHROSCOPY WITH LATERAL MENISECTOMY and Chondroplasty;  Surgeon: Earlie Server, MD;  Location: [place];  Service: Orthopedics;  Laterality: Left;  . LEAD REVISION N/A 06/29/2012   Procedure: LEAD REVISION;  Surgeon: Deboraha Sprang, MD;  Location: [facility] CATH LAB;  Service: Cardiovascular;  Laterality: N/A;  . PACEMAKER GENERATOR CHANGE N/A 06/15/2012   Procedure: PACEMAKER GENERATOR CHANGE;  Surgeon: Evans Lance, MD;  Location: [place];  Service: Cardiovascular;  Laterality: N/A;  . PACEMAKER REVISION  06/30/2012   Procedure: PACEMAKER LEAD REVISION;  Surgeon: Evans Lance, MD;  Location: [facility] CATH LAB;  Service: Cardiovascular;;  . PARACENTESIS  ~ 06/1658   complication from pacer change in 2014  . PERMANENT PACEMAKER INSERTION N/A 06/28/2012   Procedure: PERMANENT PACEMAKER INSERTION;  Surgeon: Evans Lance, MD;  Location: [facility] CATH LAB;  Service: Cardiovascular;  Laterality: N/A;  . TEE WITHOUT CARDIOVERSION  01/01/2012   Procedure: TRANSESOPHAGEAL ECHOCARDIOGRAM (TEE);  Surgeon: Fay Records, MD;  Location: [place] ENDOSCOPY;  Service: Cardiovascular;  Laterality: N/A;  . TEE WITHOUT CARDIOVERSION N/A 08/03/2014   Procedure: TRANSESOPHAGEAL ECHOCARDIOGRAM (TEE);  Surgeon: Larey Dresser, MD;  Location: [place];  Service: Cardiovascular;  Laterality: N/A;  . TEE WITHOUT CARDIOVERSION N/A 11/07/2015   Procedure: TRANSESOPHAGEAL ECHOCARDIOGRAM (TEE);  Surgeon: Larey Dresser, MD;  Location: [place];  Service: Cardiovascular;  Laterality: N/A;  . TONSILLECTOMY AND ADENOIDECTOMY  1938  . UMBILICAL HERNIA REPAIR  [phone] X 3    Family History  Problem Relation Age of Onset  . Heart attack Father 10       deceased  . AAA (abdominal aortic aneurysm) Mother        deceased AAA  . Hypertension Mother   . Other Brother        deceased at birth  . Healthy Son   .  Healthy Daughter   . Diabetes Cousin    Social History:  reports that he quit smoking about 34 years ago. His smoking use included cigarettes. He has a 40.00 pack-year smoking history. He has never used smokeless tobacco. He reports that he does not drink alcohol or use drugs.  Allergies:  Allergies  Allergen Reactions  . Antihistamines, Diphenhydramine-Type Other (See Comments)    Causes difficulty in ability to urinate.    Medications:  .  stroke: mapping our early stages of recovery book   Does not apply Once  . atorvastatin  80 mg Oral q1800  . furosemide  80 mg Oral Daily  . iopamidol      . metoprolol succinate  12.5 mg Oral Daily  . potassium chloride SA  20 mEq Oral BID  .  [START ON 07/25/2017] predniSONE  1-2 mg Oral Q breakfast  . spironolactone  25 mg Oral Daily  . tamsulosin  0.4 mg Oral Daily  . warfarin  7.5 mg Oral ONCE-1800    ROS: General ROS: negative for - chills, fatigue, fever, night sweats, weight gain or weight loss Ophthalmic ROS: negative for -admits to right lower visual field vision loss which is improving ENT ROS: negative for - epistaxis, nasal discharge, oral lesions, sore throat, tinnitus or vertigo Hematological and Lymphatic ROS: negative for - bleeding problems, bruising or swollen lymph nodes Endocrine ROS: negative for - polydipsia/polyuria or temperature intolerance Respiratory ROS: negative for - cough, hemoptysis, shortness of breath or wheezing Cardiovascular ROS: negative for - chest pain, dyspnea on exertion, edema, irregular heartbeat due to pacemaker Gastrointestinal ROS: negative for - abdominal pain, diarrhea, hematemesis, nausea/vomiting or stool incontinence Genito-Urinary ROS: negative for - dysuria, hematuria, incontinence or urinary frequency/urgency Musculoskeletal ROS: negative for - joint swelling or muscular weakness Neurological ROS: as noted in HPI Dermatological ROS: negative for rash and skin lesion changes Physical Examination: Blood pressure 104/75, pulse 75, temperature (!) 97.5 F (36.4 C), temperature source Oral, resp. rate 19, SpO2 97 %. HEENT-  Normocephalic, no lesions, without obvious abnormality.  Normal external eye and conjunctiva.   Cardiovascular-pacemaker in place Lungs-no rhonchi or wheezing noted, no excessive working breathing.  Saturations within normal limits Abdomen- All 4 quadrants palpated and nontender Musculoskeletal-no joint tenderness, deformity or swelling Skin-warm and dry, no hyperpigmentation, vitiligo, or suspicious lesions  Neurological Examination Mental Status: Alert, oriented, thought content appropriate.  Speech fluent without evidence of aphasia.  Able to follow 3  step commands without difficulty. Cranial Nerves: II: Visual fields grossly impaired in right mid eye. Blinks to threat in all 4 corners, but not to threat that is straight at eye level.  Patient with intact peripheral vision and able to read accurately but unable to visualize objects but her right eye at eye level III,IV, VI: ptosis not present, extra-ocular motions intact bilaterally.  Right pupil 2 mm and sluggish to react, left pupil 3 mm with normal reaction.   V,VII: smile symmetric, facial light touch sensation normal bilaterally VIII: Hearing intact to voice IX,X: uvula rises symmetrically XI: bilateral shoulder shrug XII: midline tongue extension Motor: Right : Upper extremity   5/5    Left:     Upper extremity   5/5  Lower extremity   5/5     Lower extremity   5/5 Tone and bulk:normal tone throughout; no atrophy noted Sensory: Pinprick and light touch intact throughout, bilaterally Deep Tendon Reflexes: 2+ and symmetric throughout Plantars: Right: downgoing   Left: downgoing Cerebellar: normal finger-to-nose,  normal rapid alternating movements and normal heel-to-shin test Gait: normal gait and station NIHSS-1  Ct Angio Head/Neck W Or Wo Contrast 07/24/2017  IMPRESSION:  1. High-grade (at least 70%) atheromatous stenosis at the right ICA bulb. The plaque is mainly low-density and is ulcerated, a likely embolic source.  2. Non-opacified left atrial appendage, probable clot.  3. 40% narrowing at the left ICA bulb.  4. No intracranial branch occlusion or high-grade stenosis.   Ct Head Wo Contrast  07/23/2017 IMPRESSION:  No acute findings.  No intracranial mass, hemorrhage or edema.    Assessment: 84 y.o. male with an extensive cardiac history of atrial fibrillation, sick sinus syndrome status post pacemaker implantation and dependency, chronic systolic CHF, CAD, dyslipidemia, polymyalgia algia rheumatica who presents to the ED with acute, painless right lower field visual loss  beginning around 12 PM on 07/23/2017.  Patient states vision has since improved and currently has 25% right vision loss.  1.  Central retinal artery occlusion likely secondary to emboli from apical thrombus , patient was noted to have 70% stenosis at right ICA bulb also likely an embolic source Ii patient with hx of Afib. Patient initially had acute, painless right lower visual field loss yesterday midday.  Patient admits to significant improvement with only the central part of the eye having visual loss-she describes as 25% of his eye with remaining vision loss.  In the setting of the CTA findings of thrombus and right ICA stenosis, we will pursue full stroke work-up with MRI of the brain, echocardiogram to assess cardioembolic source, and proceed with TEE, last TEE on 11/07/2015 did not show evidence of apical thrombus.  Patient has a new thrombus with subsequent vascular event, stroke team to assess for carotid revascularization post completion of stroke work-up.  Patient currently anticoagulated with Coumadin milligrams daily which he admits to compliance with INR currently therapeutic at 2.35.  Patient states he missed last night dose due to being in the ER and per home regimen he will take 7.5 mg today.  We will hold aspirin for now and continue high dose atorvastatin for secondary stroke prevention. 2.  Apical thrombus-continue Coumadin, Cardiology to be consulted 3.  High-grade stenosis of the right ICA-70% stenosis -vascular surgeon Dr. Donnetta Hutching consulted 4.  History of A. Fib/sick sinus syndrome currently pacemaker dependent-chronic anti-coagulation 5.  Diabetes mellitus 6.  Dyslipidemia 7.  Chronic systolic heart failure/nonischemic cardiomyopathy 8.  Polymyalgia rheumatica Stroke Risk Factors - atrial fibrillation, diabetes mellitus and hypertension  Plan: - HgbA1c, fasting lipid panel - MRI, MRA  of the brain without contrast - PT consult, OT consult, Speech consult -  Echocardiogram -Continue anticoagulation with Coumadin per home regimen, No ASA at this time, daily PT/INR - High dose Statin: Atorvastatin 80 mg - Allow for permissive hypertension for the first 24-48h - only treat PRN if SBP >220 mmHg.  Blood pressures can be gradually normalized to SBP<140 upon discharge - Risk factor modification - Telemetry monitoring -  Frequent neuro checks -Vascular surgery consult for carotid revascularization  [facility] Neuro-hospitalist Team [phone] 07/24/2017, 12:19 PM   Attending Neurohospitalist Addendum Patient seen and examined with APP/Resident. Agree with the history and physical as documented above. Agree with the plan as documented, which I helped formulate. I have independently reviewed the chart, obtained history, review of systems and examined the patient.I have personally reviewed pertinent head/neck/spine imaging (CT/MRI). Please feel free to call with any questions. --- Amie Portland, MD [facility] Pager: [phone]  If  7pm to 7am, please call on call as listed on AMION.

## 2017-07-24 NOTE — ED Notes (Signed)
ED Provider at bedside. 

## 2017-07-24 NOTE — ED Notes (Signed)
Patient transported to CT 

## 2017-07-24 NOTE — Progress Notes (Signed)
5/18 @ 1304, pt has MR unsafe pacemaker, Rn aware.

## 2017-07-24 NOTE — ED Provider Notes (Signed)
[facility] EMERGENCY DEPARTMENT Provider Note   CSN: [id number] Arrival date & time: 07/23/17  1711     History   Chief Complaint Chief Complaint  Patient presents with  . Visual Field Change    HPI [name] is a 84 y.o. male.  HPI   [name] is a 84 y.o. male, with a history of A. fib, CHF, CAD, hyperlipidemia, and pacemaker, presenting to the ED with right visual field loss beginning at around 12 PM on 5/17. States he lost vision in the lower portion of his right eye, "as if the bottom part of the horizon is blocked out."  Patient was seen by his ophthalmologist, Luberta Mutter of [facility] ophthalmology, after onset of this issue.  Patient states, "They dilated my eye, did an x-ray, and found a stroke."  Patient arrives with documentation indicating he has been diagnosed with a central retinal artery occlusion.  Patient states the vision loss has improved, "It is about 25% of what it was.  It's like an inkblot on my vision." Denies headache, weakness, numbness, facial droop, eye pain, injury, or any other complaints.         Past Medical History:  Diagnosis Date  . Arthritis    "back" (03/20/2014)  . Atrial fibrillation ([place])   . BPH (benign prostatic hypertrophy)   . Breast mass    "on both sides" (06/28/2012)  . Cardiomyopathy primary-nonischemic EF 45%  . CHF (congestive heart failure) ([place])   . Coronary artery disease   . Diverticulosis of colon without hemorrhage 01/03/2014  . Dysphagia   . Elevated liver enzymes   . Esophageal stricture   . GERD (gastroesophageal reflux disease)   . Hearing loss, mixed, bilateral   . Hx of cardiovascular stress test    Adenosine Myoview (07/2013):  Apical cap, apical lateral and mid anterolateral scar, small amount of peri-infarct ischemia, EF 36%; Medium Risk  . Hyperlipidemia   . Increased prostate specific antigen (PSA) velocity   . Internal hemorrhoids 01/03/2014  . Lumbar back  pain   . Pacemaker    st jude  . 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)    06/28/12  . Polymyalgia rheumatica ([place])   . Sick sinus syndrome [facility])     Patient Active Problem List   Diagnosis Date Noted  . Central retinal artery occlusion 07/24/2017  . Traumatic hematoma 04/01/2017  . Left knee pain 10/23/2016  . Prolapsed internal hemorrhoids, grade 2 - causing fecal seepage 06/10/2016  . Atrial fibrillation with tachycardic ventricular rate ([place]) 04/20/2016  . Chronic anticoagulation 02/10/2016  . AK (actinic keratosis) 01/28/2016  . Left breast lump 07/15/2015  . Lipoma 07/08/2015  . Neuropathy 05/16/2015  . Trigger thumb of right hand 07/04/2014  . Encounter for therapeutic drug monitoring 07/02/2014  . Allergic rhinitis 02/14/2014  . Fecal incontinence 10/18/2013  . Chronic cough 08/18/2013  . Pleural effusion, right 08/16/2013  . Chronic systolic CHF (congestive heart failure) ([place]) 03/21/2013  . Recurrent right inguinal hernia 11/22/2012  . Intention tremor 11/01/2012  . Pleural effusion on right 07/06/2012  . Nonischemic cardiomyopathy ([place]) 09/20/2010  . PPM-[place] 11/27/2008  . CAD (coronary artery disease) 07/20/2008  . SICK SINUS SYNDROME 07/20/2008  . Polymyalgia rheumatica ([place]) 07/03/2008  . MIXED HEARING LOSS BILATERAL 01/10/2008  . Hyperlipidemia 03/16/2007  . Atrial fibrillation ([place]) 03/16/2007  . GERD 03/16/2007  . BPH (benign prostatic hyperplasia) 03/16/2007    Past Surgical History:  Procedure Laterality Date  . AV NODE  ABLATION N/A 04/20/2016   Procedure: AV Node Ablation;  Surgeon: Evans Lance, MD;  Location: [facility] CV LAB;  Service: Cardiovascular;  Laterality: N/A;  . BREAST SURGERY    . CARDIAC CATHETERIZATION  1980's   [place]  . CARDIOVERSION  06/17/2011   Procedure: CARDIOVERSION;  Surgeon: Lelon Perla, MD;  Location: [facility];  Service: Cardiovascular;  Laterality: N/A;  . CARDIOVERSION  09/09/2011   Procedure: CARDIOVERSION;  Surgeon:  Carlena Bjornstad, MD;  Location: [facility];  Service: Cardiovascular;  Laterality: N/A;  . CARDIOVERSION  01/01/2012   Procedure: CARDIOVERSION;  Surgeon: Hillary Bow, MD;  Location: [facility] ENDOSCOPY;  Service: Cardiovascular;  Laterality: N/A;  . CARDIOVERSION N/A 08/03/2014   Procedure: CARDIOVERSION;  Surgeon: Larey Dresser, MD;  Location: [facility] ENDOSCOPY;  Service: Cardiovascular;  Laterality: N/A;  . CARDIOVERSION N/A 11/07/2015   Procedure: CARDIOVERSION;  Surgeon: Larey Dresser, MD;  Location: [facility] ENDOSCOPY;  Service: Cardiovascular;  Laterality: N/A;  . CARDIOVERSION N/A 11/20/2015   Procedure: CARDIOVERSION;  Surgeon: Larey Dresser, MD;  Location: [facility];  Service: Cardiovascular;  Laterality: N/A;  . CATARACT EXTRACTION, BILATERAL Bilateral 1980's  . COLONOSCOPY N/A 01/03/2014   Procedure: COLONOSCOPY;  Surgeon: Gatha Mayer, MD;  Location: [facility] ENDOSCOPY;  Service: Endoscopy;  Laterality: N/A;  . ESOPHAGOGASTRODUODENOSCOPY    . GENERATOR REMOVAL Left 06/15/2012   Procedure: GENERATOR REMOVAL;  Surgeon: Evans Lance, MD;  Location: [place];  Service: Cardiovascular;  Laterality: Left;  . HEMORRHOID BANDING    . INGUINAL HERNIA REPAIR Right 2012; 03/20/2014  . INGUINAL HERNIA REPAIR Right 03/20/2014   Procedure: OPEN REPAIR OF RIGHT INGUINAL WITH MESH;  Surgeon: Donnie Mesa, MD;  Location: [facility];  Service: General;  Laterality: Right;  . INSERT / REPLACE / REMOVE PACEMAKER    . INSERTION OF MESH Right 03/20/2014   Procedure: INSERTION OF MESH;  Surgeon: Donnie Mesa, MD;  Location: [facility];  Service: General;  Laterality: Right;  . KNEE ARTHROSCOPY WITH LATERAL MENISECTOMY Left 12/16/2016   Procedure: KNEE ARTHROSCOPY WITH LATERAL MENISECTOMY and Chondroplasty;  Surgeon: Earlie Server, MD;  Location: [facility];  Service: Orthopedics;  Laterality: Left;  . LEAD REVISION N/A 06/29/2012   Procedure: LEAD REVISION;  Surgeon: Deboraha Sprang, MD;  Location: [facility] CATH LAB;  Service: Cardiovascular;   Laterality: N/A;  . PACEMAKER GENERATOR CHANGE N/A 06/15/2012   Procedure: PACEMAKER GENERATOR CHANGE;  Surgeon: Evans Lance, MD;  Location: [facility];  Service: Cardiovascular;  Laterality: N/A;  . PACEMAKER REVISION  06/30/2012   Procedure: PACEMAKER LEAD REVISION;  Surgeon: Evans Lance, MD;  Location: [facility] CATH LAB;  Service: Cardiovascular;;  . PARACENTESIS  ~ 04/3534   complication from pacer change in 2014  . PERMANENT PACEMAKER INSERTION N/A 06/28/2012   Procedure: PERMANENT PACEMAKER INSERTION;  Surgeon: Evans Lance, MD;  Location: [facility] CATH LAB;  Service: Cardiovascular;  Laterality: N/A;  . TEE WITHOUT CARDIOVERSION  01/01/2012   Procedure: TRANSESOPHAGEAL ECHOCARDIOGRAM (TEE);  Surgeon: Fay Records, MD;  Location: [facility] ENDOSCOPY;  Service: Cardiovascular;  Laterality: N/A;  . TEE WITHOUT CARDIOVERSION N/A 08/03/2014   Procedure: TRANSESOPHAGEAL ECHOCARDIOGRAM (TEE);  Surgeon: Larey Dresser, MD;  Location: [facility];  Service: Cardiovascular;  Laterality: N/A;  . TEE WITHOUT CARDIOVERSION N/A 11/07/2015   Procedure: TRANSESOPHAGEAL ECHOCARDIOGRAM (TEE);  Surgeon: Larey Dresser, MD;  Location: [place];  Service: Cardiovascular;  Laterality: N/A;  . TONSILLECTOMY AND ADENOIDECTOMY  1938  . UMBILICAL  HERNIA REPAIR  [phone] X 3        Home Medications    Prior to Admission medications   Medication Sig Start Date End Date Taking? Authorizing Provider  acetaminophen (TYLENOL) 500 MG tablet Take 1,000 mg by mouth every 6 (six) hours as needed for moderate pain or headache.   Yes [provider]  fluticasone (FLONASE) 50 MCG/ACT nasal spray Place 2 sprays into both nostrils daily as needed for allergies or rhinitis. 07/02/17  Yes [name], MD  furosemide (LASIX) 80 MG tablet Take 1 tablet (80 mg total) by mouth 2 (two) times daily. Please call for office visit [phone] Patient taking differently: Take 80 mg by mouth daily.  06/29/17  Yes Larey Dresser, MD   hydroxypropyl methylcellulose / hypromellose (ISOPTO TEARS / GONIOVISC) 2.5 % ophthalmic solution Place 1 drop into both eyes daily as needed for dry eyes.   Yes [provider]  KLOR-CON M20 20 MEQ tablet TAKE 1 TABLET TWICE DAILY 02/10/17  Yes Larey Dresser, MD  losartan (COZAAR) 25 MG tablet Take 1 tablet (25 mg total) by mouth daily. Please call for office visit [phone] 06/29/17  Yes Larey Dresser, MD  metoprolol succinate (TOPROL-XL) 25 MG 24 hr tablet Take 0.5 tablets (12.5 mg total) by mouth daily. Please call for office visit [phone] 06/29/17  Yes Larey Dresser, MD  pravastatin (PRAVACHOL) 80 MG tablet TAKE 1/2 TABLET (40 MG TOTAL) DAILY 06/28/17  Yes [name], MD  predniSONE (DELTASONE) 1 MG tablet Take 1-2 tablets (1-2 mg total) by mouth daily with breakfast. Patient taking differently: Take 1 mg by mouth daily with breakfast.  06/22/17  Yes [name], MD  spironolactone (ALDACTONE) 25 MG tablet Take 1 tablet (25 mg total) by mouth daily. Please call for office visit [phone] 06/29/17  Yes Larey Dresser, MD  tamsulosin [facility]) 0.4 MG CAPS capsule TAKE 1 CAPSULE EVERY DAY 06/28/17  Yes [name], MD  warfarin (COUMADIN) 5 MG tablet Take 2.5-5 mg by mouth See admin instructions. Take 1/2 tablet on Saturday then take 1 tablet all the other days   Yes [provider]  warfarin (COUMADIN) 5 MG tablet TAKE 1/2 TO 1 TABLET (2.5 TO 5 MG TOTAL) DAILY AS DIRECTED BY ANTICOAGULATION CLINIC Patient not taking: Reported on 07/24/2017 03/03/17   [name], MD    Family History Family History  Problem Relation Age of Onset  . Heart attack Father 87       deceased  . AAA (abdominal aortic aneurysm) Mother        deceased AAA  . Hypertension Mother   . Other Brother        deceased at birth  . Healthy Son   . Healthy Daughter   . Diabetes Cousin     Social History Social History   Tobacco Use  . Smoking status: Former  Smoker    Packs/day: 1.00    Years: 40.00    Pack years: 40.00    Types: Cigarettes    Last attempt to quit: 03/10/1983    Years since quitting: 34.3  . Smokeless tobacco: Never Used  Substance Use Topics  . Alcohol use: No    Alcohol/week: 0.0 oz    Comment: quit drinking 2000-? alcohol related cardiomyopathy  . Drug use: No     Allergies   Antihistamines, diphenhydramine-type   Review of Systems Review of Systems  Constitutional: Negative for chills and fever.  Eyes: Positive for visual disturbance. Negative for pain and redness.  Respiratory: Negative for shortness of breath.   Cardiovascular: Negative for chest pain.  Gastrointestinal: Negative for abdominal pain, diarrhea, nausea and vomiting.  Musculoskeletal: Negative for neck pain.  Neurological: Negative for dizziness, syncope, facial asymmetry, speech difficulty, weakness, light-headedness, numbness and headaches.  All other systems reviewed and are negative.    Physical Exam Updated Vital Signs BP 121/80 (BP Location: Right Arm)   Pulse 73   Temp (!) 97.5 F (36.4 C) (Oral)   Resp 16   SpO2 100%   Physical Exam  Constitutional: He appears well-developed and well-nourished. No distress.  HENT:  Head: Normocephalic and atraumatic.  Eyes: Conjunctivae and EOM are normal.  Right pupil approximately 2 mm.  Left pupil approximately 3 mm.  EOMs intact without difficulty or pain. Notes an area of his lower right vision that is darkened out.  Neck: Normal range of motion. Neck supple.  Cardiovascular: Normal rate, regular rhythm, normal heart sounds and intact distal pulses.  Pulmonary/Chest: Effort normal and breath sounds normal. No respiratory distress.  Abdominal: Soft. There is no tenderness. There is no guarding.  Musculoskeletal: He exhibits no edema.  Lymphadenopathy:    He has no cervical adenopathy.  Neurological: He is alert.  Skin: Skin is warm and dry. He is not diaphoretic.  Psychiatric: He has  a normal mood and affect. His behavior is normal.  Nursing note and vitals reviewed.    ED Treatments / Results  Labs (all labs ordered are listed, but only abnormal results are displayed) Labs Reviewed  PROTIME-INR - Abnormal; Notable for the following components:      Result Value   Prothrombin Time 25.5 (*)    All other components within normal limits  APTT - Abnormal; Notable for the following components:   aPTT 40 (*)    All other components within normal limits  COMPREHENSIVE METABOLIC PANEL - Abnormal; Notable for the following components:   BUN 28 (*)    Total Bilirubin 1.3 (*)    GFR calc non Af Amer 51 (*)    GFR calc Af Amer 59 (*)    All other components within normal limits  ETHANOL  CBC  DIFFERENTIAL  RAPID URINE DRUG SCREEN, HOSP PERFORMED  URINALYSIS, ROUTINE W REFLEX MICROSCOPIC  HEMOGLOBIN A1C  LIPID PANEL  SEDIMENTATION RATE  C-REACTIVE PROTEIN  I-STAT TROPONIN, ED    EKG EKG Interpretation  Date/Time:  Friday Jul 23 2017 17:16:48 EDT Ventricular Rate:  77 PR Interval:    QRS Duration: 154 QT Interval:  446 QTC Calculation: 504 R Axis:   -94 Text Interpretation:  Ventricular-paced rhythm with occasional supraventricular complexes and with occasional and consecutive Premature ventricular complexes Abnormal ECG Confirmed by Pryor Curia [phone]) on 07/24/2017 7:30:15 AM Also confirmed by Pryor Curia [phone]), editor Philomena Doheny [phone])  on 07/24/2017 8:40:55 AM   Radiology Ct Angio Head W Or Wo Contrast  Result Date: 07/24/2017 CLINICAL DATA:  Retinal vascular occlusion. Vision loss on the right. EXAM: CT ANGIOGRAPHY HEAD AND NECK TECHNIQUE: Multidetector CT imaging of the head and neck was performed using the standard protocol during bolus administration of intravenous contrast. Multiplanar CT image reconstructions and MIPs were obtained to evaluate the vascular anatomy. Carotid stenosis measurements (when applicable) are obtained utilizing NASCET  criteria, using the distal internal carotid diameter as the denominator. CONTRAST:  65m ISOVUE-370 IOPAMIDOL (ISOVUE-370) INJECTION 76% COMPARISON:  Head CT from yesterday FINDINGS: CTA NECK FINDINGS Aortic arch:  Three vessel branching with mild atherosclerotic plaque. Right carotid system: Prominent mainly low-density atherosclerotic plaque and/or thrombus at the ICA bulb with at least 70% stenosis. There is positive remodeling at the level of plaque. On coronal reformats a discrete ulceration is noted. Left carotid system: Moderate mainly calcified plaque at the ICA bulb primarily, with no flow limiting stenosis or ulceration. Stenosis measures 40% at the ICA bulb. Vertebral arteries: No proximal subclavian flow limiting stenosis. The left vertebral artery is dominant. Both vertebral arteries are smooth and diffusely patent to the dura Skeleton: No acute or aggressive finding. Other neck: No significant incidental finding. Upper chest: Airway thickening and left hilar calcifications. Dual-chamber pacer lead.Non opacified left atrial appendage despite good opacification of the visualized left heart and aorta. Coronary atherosclerosis. Review of the MIP images confirms the above findings CTA HEAD FINDINGS Anterior circulation: Atherosclerotic plaque on the carotid siphons without flow limiting stenosis. Hypoplastic right A1 segment. A broad anterior communicating artery is present. Tiny outpouching from the left supraclinoid ICA, likely infundibulum based on shape. Posterior circulation: Left vertebral artery dominance. Left V4 segment calcified plaque. No flow limiting stenosis or branch occlusion. Venous sinuses: Patent Anatomic variants: As above Delayed phase: No abnormal intracranial enhancement Review of the MIP images confirms the above findings IMPRESSION: 1. High-grade (at least 70%) atheromatous stenosis at the right ICA bulb. The plaque is mainly low-density and is ulcerated, a likely embolic source. 2.  Non-opacified left atrial appendage, probable clot. 3. 40% narrowing at the left ICA bulb. 4. No intracranial branch occlusion or high-grade stenosis. Electronically Signed   By: Monte Fantasia M.D.   On: 07/24/2017 09:03   Ct Head Wo Contrast  Result Date: 07/23/2017 CLINICAL DATA:  Visual changes and RIGHT eye starting this morning. EXAM: CT HEAD WITHOUT CONTRAST TECHNIQUE: Contiguous axial images were obtained from the base of the skull through the vertex without intravenous contrast. COMPARISON:  None. FINDINGS: Brain: Mild generalized age related parenchymal atrophy with commensurate dilatation of the ventricles and sulci. Small old lacunar infarct within the RIGHT basal ganglia. There is no mass, hemorrhage, edema or other evidence of acute parenchymal abnormality. No extra-axial hemorrhage. Vascular: There are chronic calcified atherosclerotic changes of the large vessels at the skull base. No unexpected hyperdense vessel. Skull: Normal. Negative for fracture or focal lesion. Sinuses/Orbits: Periorbital and retro-orbital soft tissues are unremarkable. Orbital globes appear symmetric in position and configuration. Visualized upper paranasal sinuses are clear. Other: None. IMPRESSION: No acute findings.  No intracranial mass, hemorrhage or edema. Electronically Signed   By: Franki Cabot M.D.   On: 07/23/2017 18:18   Ct Angio Neck W And/or Wo Contrast  Result Date: 07/24/2017 CLINICAL DATA:  Retinal vascular occlusion. Vision loss on the right. EXAM: CT ANGIOGRAPHY HEAD AND NECK TECHNIQUE: Multidetector CT imaging of the head and neck was performed using the standard protocol during bolus administration of intravenous contrast. Multiplanar CT image reconstructions and MIPs were obtained to evaluate the vascular anatomy. Carotid stenosis measurements (when applicable) are obtained utilizing NASCET criteria, using the distal internal carotid diameter as the denominator. CONTRAST:  37m ISOVUE-370  IOPAMIDOL (ISOVUE-370) INJECTION 76% COMPARISON:  Head CT from yesterday FINDINGS: CTA NECK FINDINGS Aortic arch: Three vessel branching with mild atherosclerotic plaque. Right carotid system: Prominent mainly low-density atherosclerotic plaque and/or thrombus at the ICA bulb with at least 70% stenosis. There is positive remodeling at the level of plaque. On coronal reformats a discrete ulceration is noted. Left carotid system: Moderate mainly calcified  plaque at the ICA bulb primarily, with no flow limiting stenosis or ulceration. Stenosis measures 40% at the ICA bulb. Vertebral arteries: No proximal subclavian flow limiting stenosis. The left vertebral artery is dominant. Both vertebral arteries are smooth and diffusely patent to the dura Skeleton: No acute or aggressive finding. Other neck: No significant incidental finding. Upper chest: Airway thickening and left hilar calcifications. Dual-chamber pacer lead.Non opacified left atrial appendage despite good opacification of the visualized left heart and aorta. Coronary atherosclerosis. Review of the MIP images confirms the above findings CTA HEAD FINDINGS Anterior circulation: Atherosclerotic plaque on the carotid siphons without flow limiting stenosis. Hypoplastic right A1 segment. A broad anterior communicating artery is present. Tiny outpouching from the left supraclinoid ICA, likely infundibulum based on shape. Posterior circulation: Left vertebral artery dominance. Left V4 segment calcified plaque. No flow limiting stenosis or branch occlusion. Venous sinuses: Patent Anatomic variants: As above Delayed phase: No abnormal intracranial enhancement Review of the MIP images confirms the above findings IMPRESSION: 1. High-grade (at least 70%) atheromatous stenosis at the right ICA bulb. The plaque is mainly low-density and is ulcerated, a likely embolic source. 2. Non-opacified left atrial appendage, probable clot. 3. 40% narrowing at the left ICA bulb. 4. No  intracranial branch occlusion or high-grade stenosis. Electronically Signed   By: Monte Fantasia M.D.   On: 07/24/2017 09:03    Procedures Procedures (including critical care time)  Medications Ordered in ED Medications  iopamidol (ISOVUE-370) 76 % injection (has no administration in time range)  iopamidol (ISOVUE-370) 76 % injection 50 mL (50 mLs Intravenous Contrast Given 07/24/17 0814)     Initial Impression / Assessment and Plan / ED Course  I have reviewed the triage vital signs and the nursing notes.  Pertinent labs & imaging results that were available during my care of the patient were reviewed by me and considered in my medical decision making (see chart for details).  Clinical Course as of Jul 24 1025  Sat Jul 24, 2017  0646 Spoke with Dr. Leonel Ramsay, neurologist. Requests ESR and CRP. CTA head and neck.  Contact day shift neuro once CTA is returned.     [SJ]  72 Spoke with Dr. Rory Percy, neurologist, to discuss CTA results. Advises to admit patient via hospitalist and he will come see the patient.   [SJ]  1021 Spoke with Dr. Sarajane Jews, hospitalist. Agrees to admit the patient.    [SJ]    Clinical Course User Index [SJ] Jahnya Trindade C, PA-C    Patient presents with partial vision loss in the right eye.  Retinal artery occlusion reported by ophthalmology.  CTA shows high-grade stenosis in the right ICA.  Admission for further work-up and management.  Findings and plan of care discussed with [facility], DO. Dr. Leonides Schanz personally evaluated and examined this patient.  Vitals:   07/24/17 0915 07/24/17 0945 07/24/17 1000 07/24/17 1015  BP: 114/67 107/77 109/74 119/73  Pulse: 70 72 70 61  Resp: _0 Temp:      TempSrc:      SpO2: 95% 98% 97% 97%     Final Clinical Impressions(s) / ED Diagnoses   Final diagnoses:  Vision loss, right eye  Stenosis of right internal carotid artery    ED Discharge Orders    None       Lorayne Bender, PA-C 07/24/17 1027

## 2017-07-24 NOTE — ED Notes (Signed)
Admitting at bedside 

## 2017-07-24 NOTE — ED Notes (Signed)
Neurology at the bedside

## 2017-07-24 NOTE — Progress Notes (Signed)
ANTICOAGULATION CONSULT NOTE - Initial Consult  Pharmacy Consult:  Coumadin Indication: atrial fibrillation  Allergies  Allergen Reactions  . Antihistamines, Diphenhydramine-Type Other (See Comments)    Causes difficulty in ability to urinate.    Patient Measurements:     Vital Signs: Temp: 98.1 F (36.7 C) (05/18 1107) Temp Source: Oral (05/18 0551) BP: 104/54 (05/18 1230) Pulse Rate: 69 (05/18 1230)  Labs: Recent Labs    07/23/17 1734 07/24/17 1146  HGB 14.2  --   HCT 42.0  --   PLT 214  --   APTT 40*  --   LABPROT 25.5* 23.9*  INR 2.35 2.16  CREATININE 1.24  --     CrCl cannot be calculated (Unknown ideal weight.).   Medical History: Past Medical History:  Diagnosis Date  . Arthritis    "back" (03/20/2014)  . Atrial fibrillation ([place])   . BPH (benign prostatic hypertrophy)   . Breast mass    "on both sides" (06/28/2012)  . Cardiomyopathy primary-nonischemic EF 45%  . CHF (congestive heart failure) ([place])   . Coronary artery disease   . Diverticulosis of colon without hemorrhage 01/03/2014  . Dysphagia   . Elevated liver enzymes   . Esophageal stricture   . GERD (gastroesophageal reflux disease)   . Hearing loss, mixed, bilateral   . Hx of cardiovascular stress test    Adenosine Myoview (07/2013):  Apical cap, apical lateral and mid anterolateral scar, small amount of peri-infarct ischemia, EF 36%; Medium Risk  . Hyperlipidemia   . Increased prostate specific antigen (PSA) velocity   . Internal hemorrhoids 01/03/2014  . Lumbar back pain   . Pacemaker    st jude  . 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 Polymyalgia rheumatica ([place])   . Sick sinus syndrome [facility])       Assessment: 36 YOM presented with left eye visual change and CT showed right ICA stenosis.  Pharmacy consulted to continue Coumadin from PTA for history of Afib.    INR therapeutic and low normal because patient missed 07/23/17 dose.  No bleeding reported.  Home Coumadin dose:   5mg  PO daily except 2.5mg  on Sat   Goal of Therapy:  INR 2-3 Monitor platelets by anticoagulation protocol: Yes    Plan:  Coumadin 5mg  PO today Daily PT / INR   Shyloh Krinke D. Mina Marble, PharmD, BCPS, [place] Pager:  [phone] 07/24/2017, 1:05 PM

## 2017-07-24 NOTE — H&P (Addendum)
History and Physical  TRUST LEH XJO:[id number] DOB: 1929/07/16 DOA: 07/23/2017  PCP: [name], MD   Chief Complaint: Right eye visual change  HPI:  84 year old man PMH atrial fibrillation on warfarin, coronary artery disease, pacemaker placement developed decreased vision in his right eye 5/17, was seen by ophthalmologist and diagnosed with central retinal artery occlusion and sent to the emergency department.  He will be admitted for further evaluation of this and consultation with neurology.  Patient reports he does sometimes have floaters and assumed that his visual change was secondary to this.  Sometime yesterday morning 5/17 he developed decreased vision in his right eye, he thought this was a floater but when it did not resolve he called his ophthalmologist was seen in the office at which time a central retinal artery occlusion was diagnosed and the patient was sent to the emergency department that evening.  Today he reports his vision is about 75% better in the right eye.  No specific aggravating or alleviating factors.  No associated symptoms noted.  No other neurologic deficits.  ED Course: He was observed in the emergency department and neurology was consulted.  Review of Systems:  Negative for fever, sore throat, rash, new muscle aches, chest pain, SOB, dysuria, bleeding, n/v/abdominal pain.  Past Medical History:  Diagnosis Date  . Arthritis    "back" (03/20/2014)  . Atrial fibrillation ([place])   . BPH (benign prostatic hypertrophy)   . Breast mass    "on both sides" (06/28/2012)  . Cardiomyopathy primary-nonischemic EF 45%  . CHF (congestive heart failure) ([place])   . Coronary artery disease   . Diverticulosis of colon without hemorrhage 01/03/2014  . Dysphagia   . Elevated liver enzymes   . Esophageal stricture   . GERD (gastroesophageal reflux disease)   . Hearing loss, mixed, bilateral   . Hx of cardiovascular stress test    Adenosine Myoview (07/2013):   Apical cap, apical lateral and mid anterolateral scar, small amount of peri-infarct ischemia, EF 36%; Medium Risk  . Hyperlipidemia   . Increased prostate specific antigen (PSA) velocity   . Internal hemorrhoids 01/03/2014  . Lumbar back pain   . Pacemaker    st jude  . Pacemaker infection ([place])    06/28/12  . Polymyalgia rheumatica ([place])   . Sick sinus syndrome [facility])     Past Surgical History:  Procedure Laterality Date  . AV NODE ABLATION N/A 04/20/2016   Procedure: AV Node Ablation;  Surgeon: Evans Lance, MD;  Location: [place] CV LAB;  Service: Cardiovascular;  Laterality: N/A;  . BREAST SURGERY    . CARDIAC CATHETERIZATION  1980's   [place]  . CARDIOVERSION  06/17/2011   Procedure: CARDIOVERSION;  Surgeon: Lelon Perla, MD;  Location: [place];  Service: Cardiovascular;  Laterality: N/A;  . CARDIOVERSION  09/09/2011   Procedure: CARDIOVERSION;  Surgeon: Carlena Bjornstad, MD;  [facility];  Service: Cardiovascular;  Laterality: N/A;  . CARDIOVERSION  01/01/2012   Procedure: CARDIOVERSION;  Surgeon: Hillary Bow, MD;  Location: [facility] ENDOSCOPY;  Service: Cardiovascular;  Laterality: N/A;  . CARDIOVERSION N/A 08/03/2014   Procedure: CARDIOVERSION;  Surgeon: Larey Dresser, MD;  Location: [facility] ENDOSCOPY;  Service: Cardiovascular;  Laterality: N/A;  . CARDIOVERSION N/A 11/07/2015   Procedure: CARDIOVERSION;  Surgeon: Larey Dresser, MD;  Location: [facility] ENDOSCOPY;  Service: Cardiovascular;  Laterality: N/A;  . CARDIOVERSION N/A 11/20/2015   Procedure: CARDIOVERSION;  Surgeon: Larey Dresser, MD;  Location: [facility]  ENDOSCOPY;  Service: Cardiovascular;  Laterality: N/A;  . CATARACT EXTRACTION, BILATERAL Bilateral 1980's  . COLONOSCOPY N/A 01/03/2014   Procedure: COLONOSCOPY;  Surgeon: Gatha Mayer, MD;  Location: [facility] ENDOSCOPY;  Service: Endoscopy;  Laterality: N/A;  . ESOPHAGOGASTRODUODENOSCOPY    . GENERATOR REMOVAL Left 06/15/2012   Procedure: GENERATOR REMOVAL;  Surgeon: Evans Lance, MD;  Location: [place];  Service: Cardiovascular;  Laterality: Left;  . HEMORRHOID BANDING    . INGUINAL HERNIA REPAIR Right 2012; 03/20/2014  . INGUINAL HERNIA REPAIR Right 03/20/2014   Procedure: OPEN REPAIR OF RIGHT INGUINAL WITH MESH;  Surgeon: Donnie Mesa, MD;  Location: [place];  Service: General;  Laterality: Right;  . INSERT / REPLACE / REMOVE PACEMAKER    . INSERTION OF MESH Right 03/20/2014   Procedure: INSERTION OF MESH;  Surgeon: Donnie Mesa, MD;  Location: [place];  Service: General;  Laterality: Right;  . KNEE ARTHROSCOPY WITH LATERAL MENISECTOMY Left 12/16/2016   Procedure: KNEE ARTHROSCOPY WITH LATERAL MENISECTOMY and Chondroplasty;  Surgeon: Earlie Server, MD;  Location: [place];  Service: Orthopedics;  Laterality: Left;  . LEAD REVISION N/A 06/29/2012   Procedure: LEAD REVISION;  Surgeon: Deboraha Sprang, MD;  Location: [facility] CATH LAB;  Service: Cardiovascular;  Laterality: N/A;  . PACEMAKER GENERATOR CHANGE N/A 06/15/2012   Procedure: PACEMAKER GENERATOR CHANGE;  Surgeon: Evans Lance, MD;  Location: [place];  Service: Cardiovascular;  Laterality: N/A;  . PACEMAKER REVISION  06/30/2012   Procedure: PACEMAKER LEAD REVISION;  Surgeon: Evans Lance, MD;  Location: [facility] CATH LAB;  Service: Cardiovascular;;  . PARACENTESIS  ~ 09/3708   complication from pacer change in 2014  . PERMANENT PACEMAKER INSERTION N/A 06/28/2012   Procedure: PERMANENT PACEMAKER INSERTION;  Surgeon: Evans Lance, MD;  Location: [facility] CATH LAB;  Service: Cardiovascular;  Laterality: N/A;  . TEE WITHOUT CARDIOVERSION  01/01/2012   Procedure: TRANSESOPHAGEAL ECHOCARDIOGRAM (TEE);  Surgeon: Fay Records, MD;  Location: [facility] ENDOSCOPY;  Service: Cardiovascular;  Laterality: N/A;  . TEE WITHOUT CARDIOVERSION N/A 08/03/2014   Procedure: TRANSESOPHAGEAL ECHOCARDIOGRAM (TEE);  Surgeon: Larey Dresser, MD;  Location: [place];  Service: Cardiovascular;  Laterality: N/A;  . TEE WITHOUT CARDIOVERSION N/A 11/07/2015    Procedure: TRANSESOPHAGEAL ECHOCARDIOGRAM (TEE);  Surgeon: Larey Dresser, MD;  Location: [place];  Service: Cardiovascular;  Laterality: N/A;  . TONSILLECTOMY AND ADENOIDECTOMY  1938  . UMBILICAL HERNIA REPAIR  [phone] X 3     reports that he quit smoking about 34 years ago. His smoking use included cigarettes. He has a 40.00 pack-year smoking history. He has never used smokeless tobacco. He reports that he does not drink alcohol or use drugs.   Allergies  Allergen Reactions  . Antihistamines, Diphenhydramine-Type Other (See Comments)    Causes difficulty in ability to urinate.    Family History  Problem Relation Age of Onset  . Heart attack Father 55       deceased  . AAA (abdominal aortic aneurysm) Mother        deceased AAA  . Hypertension Mother   . Other Brother        deceased at birth  . Healthy Son   . Healthy Daughter   . Diabetes Cousin      Prior to Admission medications   Medication Sig Start Date End Date Taking? Authorizing Provider  acetaminophen (TYLENOL) 500 MG tablet Take 1,000 mg by mouth every 6 (six) hours as needed for moderate pain or headache.  Yes [provider]  fluticasone (FLONASE) 50 MCG/ACT nasal spray Place 2 sprays into both nostrils daily as needed for allergies or rhinitis. 07/02/17  Yes [name], MD  furosemide (LASIX) 80 MG tablet Take 1 tablet (80 mg total) by mouth 2 (two) times daily. Please call for office visit [phone] Patient taking differently: Take 80 mg by mouth daily.  06/29/17  Yes Larey Dresser, MD  hydroxypropyl methylcellulose / hypromellose (ISOPTO TEARS / GONIOVISC) 2.5 % ophthalmic solution Place 1 drop into both eyes daily as needed for dry eyes.   Yes [provider]  KLOR-CON M20 20 MEQ tablet TAKE 1 TABLET TWICE DAILY 02/10/17  Yes Larey Dresser, MD  losartan (COZAAR) 25 MG tablet Take 1 tablet (25 mg total) by mouth daily. Please call for office visit [phone] 06/29/17  Yes  Larey Dresser, MD  metoprolol succinate (TOPROL-XL) 25 MG 24 hr tablet Take 0.5 tablets (12.5 mg total) by mouth daily. Please call for office visit [phone] 06/29/17  Yes Larey Dresser, MD  pravastatin (PRAVACHOL) 80 MG tablet TAKE 1/2 TABLET (40 MG TOTAL) DAILY 06/28/17  Yes [name], MD  predniSONE (DELTASONE) 1 MG tablet Take 1-2 tablets (1-2 mg total) by mouth daily with breakfast. Patient taking differently: Take 1 mg by mouth daily with breakfast.  06/22/17  Yes [name], MD  spironolactone (ALDACTONE) 25 MG tablet Take 1 tablet (25 mg total) by mouth daily. Please call for office visit [phone] 06/29/17  Yes Larey Dresser, MD  tamsulosin [facility]) 0.4 MG CAPS capsule TAKE 1 CAPSULE EVERY DAY 06/28/17  Yes [name], MD  warfarin (COUMADIN) 5 MG tablet Take 2.5-5 mg by mouth See admin instructions. Take 1/2 tablet on Saturday then take 1 tablet all the other days   Yes [provider]  warfarin (COUMADIN) 5 MG tablet TAKE 1/2 TO 1 TABLET (2.5 TO 5 MG TOTAL) DAILY AS DIRECTED BY ANTICOAGULATION CLINIC Patient not taking: Reported on 07/24/2017 03/03/17   [name], MD    Physical Exam: Vitals:   07/24/17 0900 07/24/17 0915  BP: 122/72 114/67  Pulse: 70 70  Resp: 12 20  Temp:    SpO2: 97% 95%    Constitutional:   Appears calm and comfortable Eyes:  Left pupil 3 mm, mildly eccentric, minimally reactive.  Right pupil pinpoint, not clearly reactive. Normal lids ENMT:  grossly normal hearing  Lips appear normal Neck:  neck appears normal, no masses no thyromegaly Respiratory:  CTA bilaterally, no w/r/r.  Respiratory effort normal Cardiovascular:  RRR, no m/r/g No LE extremity edema   Abdomen:  Abdomen appears normal; no tenderness or masses No hernias No hepatomegaly Musculoskeletal:  Digits/nails BUE: no clubbing, cyanosis, petechiae, infection exam of joints, bones, muscles of at least one of following: head/neck,  RUE, LUE, RLE, LLE   strength and tone normal, no atrophy, no abnormal movements Skin:  No rashes, lesions, ulcers palpation of skin: no induration or nodules Neurologic:  CN 2-12 appear intact Sensation bilateral lower extremities intact Psychiatric:  Mental status Mood, affect appropriate judgment and insight appear normal  I have personally reviewed following labs and imaging studies  Labs:   Complete metabolic panel unremarkable  LDL 76  CBC unremarkable  ESR 2  INR 2.35  Hemoglobin A1c 5.6  Imaging studies:   CT head no acute abnormalities.  CTA head and neck showed high-grade stenosis right ICA with ulceration, likely embolic source.  Possible left  atrial appendage thrombus.  Medical tests:   EKG independently reviewed: Paced rhythm   Principal Problem:   Central retinal artery occlusion Active Problems:   Atrial fibrillation (HCC)   Polymyalgia rheumatica (HCC)   Thrombus of left atrial appendage without antecedent myocardial infarction   Assessment/Plan Right central retinal artery occlusion with vision loss right eye, likely secondary to right ICA ulceration, stenosis.  No other associated neurologic symptoms.  Subjectively improved per patient. --Follow-up neurology recommendations --Complete stroke evaluation --Follow-up with ophthalmology as an outpatient --Will continue warfarin  Possible left atrial appendage thrombus. --Continue warfarin.  Atrial fibrillation --Continue warfarin.  EKG shows paced rhythm.  Polymyalgia rheumatica --Continue chronic prednisone  Severity of Illness: The appropriate patient status for this patient is INPATIENT. Inpatient status is judged to be reasonable and necessary in order to provide the required intensity of service to ensure the patient's safety. The patient's presenting symptoms, physical exam findings, and initial radiographic and laboratory data in the context of their chronic comorbidities is felt to  place them at high risk for further clinical deterioration. Furthermore, it is not anticipated that the patient will be medically stable for discharge from the hospital within 2 midnights of admission. The following factors support the patient status of inpatient.   * I certify that at the point of admission it is my clinical judgment that the patient will require inpatient hospital care spanning beyond 2 midnights from the point of admission due to high intensity of service, high risk for further deterioration and high frequency of surveillance required.*  DVT prophylaxis: warfarin Code Status: full Family Communication: none Consults called: neurology     Time spent: 60 minutes  Murray Hodgkins, MD  [facility] Direct contact: [phone] --[place] [place]  --[email or web address]; password [email or web address]  7PM-7AM contact night coverage as above  07/24/2017, 10:17 AM

## 2017-07-25 ENCOUNTER — Inpatient Hospital Stay (HOSPITAL_COMMUNITY): Payer: Medicare Other

## 2017-07-25 DIAGNOSIS — I5022 Chronic systolic (congestive) heart failure: Secondary | ICD-10-CM

## 2017-07-25 DIAGNOSIS — I6521 Occlusion and stenosis of right carotid artery: Principal | ICD-10-CM

## 2017-07-25 DIAGNOSIS — Z01818 Encounter for other preprocedural examination: Secondary | ICD-10-CM

## 2017-07-25 DIAGNOSIS — I420 Dilated cardiomyopathy: Secondary | ICD-10-CM

## 2017-07-25 DIAGNOSIS — I351 Nonrheumatic aortic (valve) insufficiency: Secondary | ICD-10-CM

## 2017-07-25 DIAGNOSIS — I482 Chronic atrial fibrillation: Secondary | ICD-10-CM

## 2017-07-25 LAB — LIPID PANEL
CHOL/HDL RATIO: 3.1 ratio
CHOLESTEROL: 127 mg/dL (ref 0–200)
HDL: 41 mg/dL (ref >40)
LDL CALC: 72 mg/dL (ref 0–99)
TRIGLYCERIDES: 69 mg/dL (ref <150)
VLDL: 14 mg/dL (ref 0–40)

## 2017-07-25 LAB — ECHOCARDIOGRAM COMPLETE
HEIGHTINCHES: 66 in
Weight: 2446.4 oz

## 2017-07-25 LAB — HEMOGLOBIN A1C
HEMOGLOBIN A1C: 5.6 % (ref 4.8–5.6)
Mean Plasma Glucose: 114.02 mg/dL

## 2017-07-25 LAB — CBC
HEMATOCRIT: 41.3 % (ref 39.0–52.0)
HEMOGLOBIN: 14.3 g/dL (ref 13.0–17.0)
MCH: 31.2 pg (ref 26.0–34.0)
MCHC: 34.6 g/dL (ref 30.0–36.0)
MCV: 90 fL (ref 78.0–100.0)
Platelets: 211 10*3/uL (ref 150–400)
RBC: 4.59 MIL/uL (ref 4.22–5.81)
RDW: 13.5 % (ref 11.5–15.5)
WBC: 7.9 10*3/uL (ref 4.0–10.5)

## 2017-07-25 LAB — BASIC METABOLIC PANEL
ANION GAP: 8 (ref 5–15)
BUN: 22 mg/dL — ABNORMAL HIGH (ref 6–20)
CALCIUM: 9 mg/dL (ref 8.9–10.3)
CO2: 24 mmol/L (ref 22–32)
Chloride: 106 mmol/L (ref 101–111)
Creatinine, Ser: 0.97 mg/dL (ref 0.61–1.24)
GFR calc non Af Amer: >60 mL/min (ref >60)
Glucose, Bld: 93 mg/dL (ref 65–99)
POTASSIUM: 3.9 mmol/L (ref 3.5–5.1)
Sodium: 138 mmol/L (ref 135–145)

## 2017-07-25 LAB — PROTIME-INR
INR: 1.86
Prothrombin Time: 21.3 seconds — ABNORMAL HIGH (ref 11.4–15.2)

## 2017-07-25 LAB — SURGICAL PCR SCREEN
MRSA, PCR: NEGATIVE
Staphylococcus aureus: NEGATIVE

## 2017-07-25 LAB — HEPARIN LEVEL (UNFRACTIONATED): HEPARIN UNFRACTIONATED: 0.3 [IU]/mL (ref 0.30–0.70)

## 2017-07-25 MED ORDER — CEFAZOLIN SODIUM-DEXTROSE 1-4 GM/50ML-% IV SOLN
1.0000 g | INTRAVENOUS | Status: AC
Start: 2017-07-26 — End: 2017-07-26
  Administered 2017-07-26: 1 g via INTRAVENOUS
  Filled 2017-07-25 (×2): qty 50

## 2017-07-25 MED ORDER — HEPARIN (PORCINE) IN NACL 100-0.45 UNIT/ML-% IJ SOLN: 12.0000 [IU]/kg/h | INTRAMUSCULAR | Status: DC

## 2017-07-25 MED ORDER — MUPIROCIN 2 % EX OINT
1.0000 "application " | TOPICAL_OINTMENT | Freq: Two times a day (BID) | CUTANEOUS | Status: DC
  Administered 2017-07-25 – 2017-07-26 (×2): 1 via NASAL
  Filled 2017-07-25 (×3): qty 22

## 2017-07-25 MED ORDER — HEPARIN (PORCINE) IN NACL 100-0.45 UNIT/ML-% IJ SOLN
1200.0000 [IU]/h | INTRAMUSCULAR | Status: DC
  Administered 2017-07-25: 1050 [IU]/h via INTRAVENOUS
  Filled 2017-07-25: qty 250

## 2017-07-25 NOTE — Progress Notes (Signed)
Patient ID: [name], male   DOB: 11-23-1929, 84 y.o.   MRN: [id number] Comfortable this morning.  Left well.  No new neurologic deficits Discussed with patient and cardiology physician assistant present.  INR has normalized.  Will start IV heparin and hold Coumadin.  Can proceed with right carotid endarterectomy tomorrow if cleared from cardiac standpoint.  Again explained to the patient that there is no urgency to do this tomorrow but due to the issue regarding anticoagulation would be most convenient to care for this while he is here.  No contraindication to endarterectomy [name] after his retinal artery event.  Echocardiogram pending today.  Has been posted for right carotid endarterectomy tomorrow.  Will cancel if there is felt to be cardiac contra indications.

## 2017-07-25 NOTE — Progress Notes (Signed)
ANTICOAGULATION CONSULT NOTE - Follow Up Consult  Pharmacy Consult for Heparin Indication: atrial fibrillation  Allergies  Allergen Reactions  . Antihistamines, Diphenhydramine-Type Other (See Comments)    Causes difficulty in ability to urinate.    Patient Measurements: Height: 5\' 6"  (167.6 cm) Weight: 152 lb 14.4 oz (69.4 kg) IBW/kg (Calculated) : 63.8  Vital Signs: Temp: 98.3 F (36.8 C) (05/19 1954) Temp Source: Oral (05/19 1954) BP: 109/78 (05/19 1954) Pulse Rate: 74 (05/19 1954)  Labs: Recent Labs    07/23/17 1734 07/24/17 1146 07/25/17 0533 07/25/17 2046  HGB 14.2  --  14.3  --   HCT 42.0  --  41.3  --   PLT 214  --  211  --   APTT 40*  --   --   --   LABPROT 25.5* 23.9* 21.3*  --   INR 2.35 2.16 1.86  --   HEPARINUNFRC  --   --   --  0.30  CREATININE 1.24  --  0.97  --     Estimated Creatinine Clearance: 49.3 mL/min (by C-G formula based on SCr of 0.97 mg/dL).  Assessment: 84 year old male on warfarin prior to admission for Afib, INR = 1.86, Warfarin currently on hold with plans for endarterectomy for central retinal artery occlusion  Warfarin dose prior to admission = 2.5 mg on Saturdays, 5 mg other days  Initial heparin level = 0.3  Goal of Therapy:  Monitor platelets by anticoagulation protocol: Yes  Heparin level = 0.3 to 0.7   Plan:  Warfarin on hold Increase heparin to 1100 units / hr Daily heparin level, CBC  Thank you Anette Guarneri, PharmD [phone] 07/25/2017,9:27 PM

## 2017-07-25 NOTE — Consult Note (Addendum)
Cardiology Consultation:   Patient ID: [name]; [id number]; December 06, 1929   Admit date: 07/23/2017 Date of Consult: 07/25/2017  Primary Care Provider: [name], MD Primary Cardiologist: Dr. Aundra Dubin Primary Electrophysiologist:  Dr. Lovena Le  Patient Profile:   [name] is a 84 y.o. male with a hx of persistent atrial fibrillations (multiple failed DCCV, Failed Tikosyn, lung toxicity on amiodarone & AV node ablation), chronic PMR, anticoagulation with warfarin, nonischemic cardiomyopathy,  HLD, and complete heart block with St Jude CRT-P who is being seen today for the evaluation of cardiac clearance at the request of Dr. Reesa Chew.   Hx of high-grade conduction system disease, who developed a pacemaker pocket infection 06/2012 and underwent extraction, initial insertion of a temporary permanent pacemaker, followed by insertion of a biventricular pacemaker. His procedure was complicated by atrial lead perforation x2, resulting in a pleural effusion requiring chest tube. He underwent multiple DCCV in past but has reverted back to atrial fib.  He had developed amio lung toxicity previously and Tikosyn before that was ineffective. He has had AV node ablation 04/2016.  Hx of presumed nonischemic cardiomyopathy (prior cath with only mild CAD).  He had an adenosine Cardiolite in 5/15 with EF 36% and possible small area of apical and lateral scar with peri-infarct ischemia. He has Research officer, political party CRT-P.  Last TEE in 8/17 showed EF 25-30% .    History of Present Illness:   [name] has sudden R sided botton vision loss on Friday 07/23/17. Seen by ophthalmology and found to have retinal artery occlusion of his right eye. Sent to ER. CT of the head was unremarkable. CTA of the head and neck showed a high-grade stenosis of the right ICA-70% as well as left atrial appendage probably clot. Seen by neurologist who felt that his retinal artery occlusion is likely 2nd to emboli from LAA thrombus vs  70% right ICA. Patient was seen by vascular surgery and felt that his right retinal artery occlusion most likely from his high-grade irregular right internal carotid artery stenosis. Recommended right carotid endarterectomy for reduction of stroke risk. Cardiology is asked cardiac clearance and possible TEE for apical thrombus. Pending echo today.   Troponin negative. Electrolytes and Scr normal. UDS clear. INR 2.3 on admission. His INR was mostly in therapeutic range prior to presentation.  INR 1.86 today. Plan to start Heparin and hold coumadin for right carotid endarterectomy tomorrow.   He does water aerobic 3 times/week and ellyptical/bicycling on other days. No limiting symptoms. No chest pain, dyspnea, orthopnea, PND, syncope, LE edema or melena.   Past Medical History:  Diagnosis Date  . Arthritis    "back" (03/20/2014)  . Atrial fibrillation ([place])   . BPH (benign prostatic hypertrophy)   . Breast mass    "on both sides" (06/28/2012)  . Cardiomyopathy primary-nonischemic EF 45%  . CHF (congestive heart failure) ([place])   . Coronary artery disease   . Diverticulosis of colon without hemorrhage 01/03/2014  . Dysphagia   . Elevated liver enzymes   . Esophageal stricture   . GERD (gastroesophageal reflux disease)   . Hearing loss, mixed, bilateral   . Hx of cardiovascular stress test    Adenosine Myoview (07/2013):  Apical cap, apical lateral and mid anterolateral scar, small amount of peri-infarct ischemia, EF 36%; Medium Risk  . Hyperlipidemia   . Increased prostate specific antigen (PSA) velocity   . Internal hemorrhoids 01/03/2014  . Lumbar back pain   . Pacemaker    st jude  .  Pacemaker infection ([place])    06/28/12  . Polymyalgia rheumatica ([place])   . Sick sinus syndrome [facility])     Past Surgical History:  Procedure Laterality Date  . AV NODE ABLATION N/A 04/20/2016   Procedure: AV Node Ablation;  Surgeon: Evans Lance, MD;  Location: [place] CV LAB;  Service:  Cardiovascular;  Laterality: N/A;  . CARDIAC CATHETERIZATION  1980's   [place]  . CARDIOVERSION  06/17/2011   Procedure: CARDIOVERSION;  Surgeon: Lelon Perla, MD;  Location: [place];  Service: Cardiovascular;  Laterality: N/A;  . CARDIOVERSION  09/09/2011   Procedure: CARDIOVERSION;  Surgeon: Carlena Bjornstad, MD;  Location: [place];  Service: Cardiovascular;  Laterality: N/A;  . CARDIOVERSION  01/01/2012   Procedure: CARDIOVERSION;  Surgeon: Hillary Bow, MD;  Location: [facility] ENDOSCOPY;  Service: Cardiovascular;  Laterality: N/A;  . CARDIOVERSION N/A 08/03/2014   Procedure: CARDIOVERSION;  Surgeon: Larey Dresser, MD;  Location: [facility] ENDOSCOPY;  Service: Cardiovascular;  Laterality: N/A;  . CARDIOVERSION N/A 11/07/2015   Procedure: CARDIOVERSION;  Surgeon: Larey Dresser, MD;  Location: [facility] ENDOSCOPY;  Service: Cardiovascular;  Laterality: N/A;  . CARDIOVERSION N/A 11/20/2015   Procedure: CARDIOVERSION;  Surgeon: Larey Dresser, MD;  Location: [place];  Service: Cardiovascular;  Laterality: N/A;  . CATARACT EXTRACTION, BILATERAL Bilateral 1980's  . COLONOSCOPY N/A 01/03/2014   Procedure: COLONOSCOPY;  Surgeon: Gatha Mayer, MD;  Location: [facility] ENDOSCOPY;  Service: Endoscopy;  Laterality: N/A;  . ESOPHAGOGASTRODUODENOSCOPY    . GENERATOR REMOVAL Left 06/15/2012   Procedure: GENERATOR REMOVAL;  Surgeon: Evans Lance, MD;  Location: [place];  Service: Cardiovascular;  Laterality: Left;  . HEMORRHOID BANDING    . INGUINAL HERNIA REPAIR Right 2012; 03/20/2014  . INGUINAL HERNIA REPAIR Right 03/20/2014   Procedure: OPEN REPAIR OF RIGHT INGUINAL WITH MESH;  Surgeon: Donnie Mesa, MD;  Location: [place];  Service: General;  Laterality: Right;  . INSERT / REPLACE / REMOVE PACEMAKER    . INSERTION OF MESH Right 03/20/2014   Procedure: INSERTION OF MESH;  Surgeon: Donnie Mesa, MD;  Location: [place];  Service: General;  Laterality: Right;  . KNEE ARTHROSCOPY WITH LATERAL MENISECTOMY Left 12/16/2016    Procedure: KNEE ARTHROSCOPY WITH LATERAL MENISECTOMY and Chondroplasty;  Surgeon: Earlie Server, MD;  Location: [place];  Service: Orthopedics;  Laterality: Left;  . LEAD REVISION N/A 06/29/2012   Procedure: LEAD REVISION;  Surgeon: Deboraha Sprang, MD;  Location: [facility] CATH LAB;  Service: Cardiovascular;  Laterality: N/A;  . PACEMAKER GENERATOR CHANGE N/A 06/15/2012   Procedure: PACEMAKER GENERATOR CHANGE;  Surgeon: Evans Lance, MD;  Location: [place];  Service: Cardiovascular;  Laterality: N/A;  . PACEMAKER REVISION  06/30/2012   Procedure: PACEMAKER LEAD REVISION;  Surgeon: Evans Lance, MD;  Location: [facility] CATH LAB;  Service: Cardiovascular;;  . PARACENTESIS  ~ 10/6576   complication from pacer change in 2014  . PERMANENT PACEMAKER INSERTION N/A 06/28/2012   Procedure: PERMANENT PACEMAKER INSERTION;  Surgeon: Evans Lance, MD;  Location: [facility] CATH LAB;  Service: Cardiovascular;  Laterality: N/A;  . TEE WITHOUT CARDIOVERSION  01/01/2012   Procedure: TRANSESOPHAGEAL ECHOCARDIOGRAM (TEE);  Surgeon: Fay Records, MD;  Location: [facility] ENDOSCOPY;  Service: Cardiovascular;  Laterality: N/A;  . TEE WITHOUT CARDIOVERSION N/A 08/03/2014   Procedure: TRANSESOPHAGEAL ECHOCARDIOGRAM (TEE);  Surgeon: Larey Dresser, MD;  Location: [place];  Service: Cardiovascular;  Laterality: N/A;  . TEE WITHOUT CARDIOVERSION N/A 11/07/2015   Procedure: TRANSESOPHAGEAL  ECHOCARDIOGRAM (TEE);  Surgeon: Larey Dresser, MD;  Location: [place];  Service: Cardiovascular;  Laterality: N/A;  . TONSILLECTOMY AND ADENOIDECTOMY  1938  . UMBILICAL HERNIA REPAIR  [phone] X 3     Inpatient Medications: Scheduled Meds: . atorvastatin  80 mg Oral q1800  . furosemide  80 mg Oral Daily  . metoprolol succinate  12.5 mg Oral Daily  . potassium chloride SA  20 mEq Oral BID  . predniSONE  1 mg Oral Q breakfast  . sodium chloride flush  3 mL Intravenous Q12H  . spironolactone  25 mg Oral Daily  . tamsulosin  0.4 mg Oral Daily  .  Warfarin - Pharmacist Dosing Inpatient   Does not apply q1800   Continuous Infusions: . sodium chloride     PRN Meds: sodium chloride, acetaminophen **OR** acetaminophen, fluticasone, polyvinyl alcohol, sodium chloride flush  Allergies:    Allergies  Allergen Reactions  . Antihistamines, Diphenhydramine-Type Other (See Comments)    Causes difficulty in ability to urinate.    Social History:   Social History   Socioeconomic History  . Marital status: Married    Spouse name: Not on file  . Number of children: 3  . Years of education: Not on file  . Highest education level: Not on file  Occupational History  . Occupation: retired    Fish farm manager: RETIRED    Comment: Engineering geologist  Social Needs  . Financial resource strain: Not on file  . Food insecurity:    Worry: Not on file    Inability: Not on file  . Transportation needs:    Medical: Not on file    Non-medical: Not on file  Tobacco Use  . Smoking status: Former Smoker    Packs/day: 1.00    Years: 40.00    Pack years: 40.00    Types: Cigarettes    Last attempt to quit: 03/10/1983    Years since quitting: 34.4  . Smokeless tobacco: Never Used  Substance and Sexual Activity  . Alcohol use: No    Alcohol/week: 0.0 oz    Comment: quit drinking 2000-? alcohol related cardiomyopathy  . Drug use: No  . Sexual activity: Not Currently  Lifestyle  . Physical activity:    Days per week: Not on file    Minutes per session: Not on file  . Stress: Not on file  Relationships  . Social connections:    Talks on phone: Not on file    Gets together: Not on file    Attends religious service: Not on file    Active member of club or organization: Not on file    Attends meetings of clubs or organizations: Not on file    Relationship status: Not on file  . Intimate partner violence:    Fear of current or ex partner: Not on file    Emotionally abused: Not on file    Physically abused: Not on file    Forced sexual  activity: Not on file  Other Topics Concern  . Not on file  Social History Narrative   Married. 3 children (6 together). 5 grandkids with with 5 grandkids (2nd marriage). No greatgrandkids.    One story home.   Retired from Financial [employer]   Education: college   Hobbies: play golf, bridge, garden, write family stories    Family History:   Family History  Problem Relation Age of Onset  . Heart attack Father 72       deceased  . AAA (  abdominal aortic aneurysm) Mother        deceased AAA  . Hypertension Mother   . Other Brother        deceased at birth  . Healthy Son   . Healthy Daughter   . Diabetes Cousin      ROS:  Please see the history of present illness. All other ROS reviewed and negative.     Physical Exam/Data:   Vitals:   07/24/17 1600 07/24/17 1800 07/24/17 2002 07/25/17 0358  BP: 114/66  126/78 111/68  Pulse: 75 75 70 77  Resp: 17 18 18 20   Temp:   98.4 F (36.9 C) 97.6 F (36.4 C)  TempSrc:   Oral Oral  SpO2: 96% 95% 100% 96%   No intake or output data in the 24 hours ending 07/25/17 0755 There were no vitals filed for this visit. There is no height or weight on file to calculate BMI.  General:  Well nourished, well developed, in no acute distress HEENT: normal Lymph: no adenopathy Neck: no JVD Endocrine:  No thryomegaly Vascular: R sided carotid bruits; FA pulses 2+ bilaterally without bruits  Cardiac:  normal S1, S2; RRR; no murmur  Lungs:  clear to auscultation bilaterally, no wheezing, rhonchi or rales  Abd: soft, nontender, no hepatomegaly  Ext: no edema Musculoskeletal:  No deformities, BUE and BLE strength normal and equal Skin: warm and dry  Neuro:   no focal abnormalities noted, R sided vision loss Psych:  Normal affect   EKG:  The EKG was personally reviewed and demonstrates:  V paced rhythm Telemetry:  Telemetry was personally reviewed and demonstrates:  Pacing   Relevant CV Studies: TEE 11/07/2015 Study  Conclusions  - Left ventricle: Normal LV size and wall thickness. EF 25-30%,   diffuse hypokinesis. - Aortic valve: There was no stenosis. - Aorta: Normal caliber aorta with minimal plaque. - Mitral valve: Moderate central mitral regurgitation. - Left atrium: The atrium was moderately dilated. No evidence of   thrombus in the atrial cavity or appendage. - Right ventricle: The cavity size was mildly dilated. Pacer wire   or catheter noted in right ventricle. Systolic function was   mildly reduced. - Right atrium: The atrium was moderately dilated. - Atrial septum: There appeared to be a small PFO. - Tricuspid valve: There was moderate regurgitation. Peak RV-RA   gradient 22 mmHg.  Laboratory Data:  Chemistry Recent Labs  Lab 07/23/17 1734 07/25/17 0533  NA 139 138  K 4.2 3.9  CL 105 106  CO2 27 24  GLUCOSE 87 93  BUN 28* 22*  CREATININE 1.24 0.97  CALCIUM 9.6 9.0  GFRNONAA 51* >60  GFRAA 59* >60  ANIONGAP 7 8    Recent Labs  Lab 07/23/17 1734  PROT 6.8  ALBUMIN 4.2  AST 29  ALT 22  ALKPHOS 62  BILITOT 1.3*   Hematology Recent Labs  Lab 07/23/17 1734 07/25/17 0533  WBC 8.6 7.9  RBC 4.63 4.59  HGB 14.2 14.3  HCT 42.0 41.3  MCV 90.7 90.0  MCH 30.7 31.2  MCHC 33.8 34.6  RDW 13.7 13.5  PLT 214 211    Recent Labs  Lab 07/23/17 1735  TROPIPOC 0.01    Radiology/Studies:  Ct Angio Head W Or Wo Contrast  Result Date: 07/24/2017 CLINICAL DATA:  Retinal vascular occlusion. Vision loss on the right. EXAM: CT ANGIOGRAPHY HEAD AND NECK TECHNIQUE: Multidetector CT imaging of the head and neck was performed using the standard protocol during bolus  administration of intravenous contrast. Multiplanar CT image reconstructions and MIPs were obtained to evaluate the vascular anatomy. Carotid stenosis measurements (when applicable) are obtained utilizing NASCET criteria, using the distal internal carotid diameter as the denominator. CONTRAST:  3mL ISOVUE-370 IOPAMIDOL  (ISOVUE-370) INJECTION 76% COMPARISON:  Head CT from yesterday FINDINGS: CTA NECK FINDINGS Aortic arch: Three vessel branching with mild atherosclerotic plaque. Right carotid system: Prominent mainly low-density atherosclerotic plaque and/or thrombus at the ICA bulb with at least 70% stenosis. There is positive remodeling at the level of plaque. On coronal reformats a discrete ulceration is noted. Left carotid system: Moderate mainly calcified plaque at the ICA bulb primarily, with no flow limiting stenosis or ulceration. Stenosis measures 40% at the ICA bulb. Vertebral arteries: No proximal subclavian flow limiting stenosis. The left vertebral artery is dominant. Both vertebral arteries are smooth and diffusely patent to the dura Skeleton: No acute or aggressive finding. Other neck: No significant incidental finding. Upper chest: Airway thickening and left hilar calcifications. Dual-chamber pacer lead.Non opacified left atrial appendage despite good opacification of the visualized left heart and aorta. Coronary atherosclerosis. Review of the MIP images confirms the above findings CTA HEAD FINDINGS Anterior circulation: Atherosclerotic plaque on the carotid siphons without flow limiting stenosis. Hypoplastic right A1 segment. A broad anterior communicating artery is present. Tiny outpouching from the left supraclinoid ICA, likely infundibulum based on shape. Posterior circulation: Left vertebral artery dominance. Left V4 segment calcified plaque. No flow limiting stenosis or branch occlusion. Venous sinuses: Patent Anatomic variants: As above Delayed phase: No abnormal intracranial enhancement Review of the MIP images confirms the above findings IMPRESSION: 1. High-grade (at least 70%) atheromatous stenosis at the right ICA bulb. The plaque is mainly low-density and is ulcerated, a likely embolic source. 2. Non-opacified left atrial appendage, probable clot. 3. 40% narrowing at the left ICA bulb. 4. No intracranial  branch occlusion or high-grade stenosis. Electronically Signed   By: Monte Fantasia M.D.   On: 07/24/2017 09:03   Ct Head Wo Contrast  Result Date: 07/23/2017 CLINICAL DATA:  Visual changes and RIGHT eye starting this morning. EXAM: CT HEAD WITHOUT CONTRAST TECHNIQUE: Contiguous axial images were obtained from the base of the skull through the vertex without intravenous contrast. COMPARISON:  None. FINDINGS: Brain: Mild generalized age related parenchymal atrophy with commensurate dilatation of the ventricles and sulci. Small old lacunar infarct within the RIGHT basal ganglia. There is no mass, hemorrhage, edema or other evidence of acute parenchymal abnormality. No extra-axial hemorrhage. Vascular: There are chronic calcified atherosclerotic changes of the large vessels at the skull base. No unexpected hyperdense vessel. Skull: Normal. Negative for fracture or focal lesion. Sinuses/Orbits: Periorbital and retro-orbital soft tissues are unremarkable. Orbital globes appear symmetric in position and configuration. Visualized upper paranasal sinuses are clear. Other: None. IMPRESSION: No acute findings.  No intracranial mass, hemorrhage or edema. Electronically Signed   By: Franki Cabot M.D.   On: 07/23/2017 18:18   Ct Angio Neck W And/or Wo Contrast  Result Date: 07/24/2017 CLINICAL DATA:  Retinal vascular occlusion. Vision loss on the right. EXAM: CT ANGIOGRAPHY HEAD AND NECK TECHNIQUE: Multidetector CT imaging of the head and neck was performed using the standard protocol during bolus administration of intravenous contrast. Multiplanar CT image reconstructions and MIPs were obtained to evaluate the vascular anatomy. Carotid stenosis measurements (when applicable) are obtained utilizing NASCET criteria, using the distal internal carotid diameter as the denominator. CONTRAST:  72mL ISOVUE-370 IOPAMIDOL (ISOVUE-370) INJECTION 76% COMPARISON:  Head CT from  yesterday FINDINGS: CTA NECK FINDINGS Aortic arch:  Three vessel branching with mild atherosclerotic plaque. Right carotid system: Prominent mainly low-density atherosclerotic plaque and/or thrombus at the ICA bulb with at least 70% stenosis. There is positive remodeling at the level of plaque. On coronal reformats a discrete ulceration is noted. Left carotid system: Moderate mainly calcified plaque at the ICA bulb primarily, with no flow limiting stenosis or ulceration. Stenosis measures 40% at the ICA bulb. Vertebral arteries: No proximal subclavian flow limiting stenosis. The left vertebral artery is dominant. Both vertebral arteries are smooth and diffusely patent to the dura Skeleton: No acute or aggressive finding. Other neck: No significant incidental finding. Upper chest: Airway thickening and left hilar calcifications. Dual-chamber pacer lead.Non opacified left atrial appendage despite good opacification of the visualized left heart and aorta. Coronary atherosclerosis. Review of the MIP images confirms the above findings CTA HEAD FINDINGS Anterior circulation: Atherosclerotic plaque on the carotid siphons without flow limiting stenosis. Hypoplastic right A1 segment. A broad anterior communicating artery is present. Tiny outpouching from the left supraclinoid ICA, likely infundibulum based on shape. Posterior circulation: Left vertebral artery dominance. Left V4 segment calcified plaque. No flow limiting stenosis or branch occlusion. Venous sinuses: Patent Anatomic variants: As above Delayed phase: No abnormal intracranial enhancement Review of the MIP images confirms the above findings IMPRESSION: 1. High-grade (at least 70%) atheromatous stenosis at the right ICA bulb. The plaque is mainly low-density and is ulcerated, a likely embolic source. 2. Non-opacified left atrial appendage, probable clot. 3. 40% narrowing at the left ICA bulb. 4. No intracranial branch occlusion or high-grade stenosis. Electronically Signed   By: Monte Fantasia M.D.   On:  07/24/2017 09:03    Assessment and Plan:   1. NICM/Chronic systolic CHF - LVEF ~72% by last TEE in 2017. Euvolemic by exam. Pending echo this admission. Continue lasix, BB, spironolactone.   2. Persistent atrial fibrillation S/p AV node ablation and St Jude CRT-P  - Paced rhythm. No bleeding issue on coumadin.   3. Possible clot on left atrial appendage - Noted on CT angio of neck. Pending echo. Compliant with coumadin. His INR mostly in therapeutic range.   Ref. Range 02/24/2017 09:18 03/23/2017 14:48 04/20/2017 09:10 05/18/2017 09:04 06/16/2017 09:02 07/14/2017 15:04 07/23/2017 17:34 07/24/2017 11:46 07/25/2017 05:33  INR Unknown 2.0 1.9 2.7 1.9 2.2 2.3 2.35 2.16 1.86   4. Cardiac clearance for right carotid endarterectomy - He denies any anginal or CHF symptoms. He gets > 4 mets of activity very easily perDASI. He does water aerobic 3 times/week and elypticals/bycycling on other days. No limiting symptoms. ? clot on left atrial appendage however less likely a issue given mostly therapeutic range of INR as noted above. Pending echocardiogram. He is on warfarin already for anticoagulation. Plan to start IV heparin today and hold coumadin tonight for surgery tomorrow.  - I think he should be cleared at acceptable risk without any further intervention (TEE) but wait for echocardiogram that schedule today. MD to finally clear the patient.   For questions or updates, please contact [facility] Please consult [email or web address] for contact info under Cardiology/STEMI.   Jarrett Soho, [place]  07/25/2017 7:55 AM   Patient seen and independently examined with Leanor Kail, [place]. We discussed all aspects of the encounter. I agree with the assessment and plan as stated above.  Patient has a history of NIDCM with EF 25-30% s/p CRT-P.  He has a history of atrial fibrillation with multiple attempts to keep in  NSR but failed Tikosyn, lung tox with Amio and ultimately AVN ablation with CRT-P.  Remote  cath for workup of DCM with minimal nonobstructive CAD.  He has no anginal sx and does water aerobics and rids on the bike and elliptical several times weekly with no problems.    GEN: Well nourished, well developed in no acute distress HEENT: Normal NECK: No JVD; No carotid bruits LYMPHATICS: No lymphadenopathy CARDIAC:RRR, no murmurs, rubs, gallops RESPIRATORY:  Clear to auscultation without rales, wheezing or rhonchi  ABDOMEN: Soft, non-tender, non-distended MUSCULOSKELETAL:  No edema; No deformity  SKIN: Warm and dry NEUROLOGIC:  Alert and oriented x 3 PSYCHIATRIC:  Normal affect   Tele and EKG show BiV paced rhythm with PVCs.  INR was therapeutic on admission.  ? LAA thrombus but his INR has been therapeutic at home.  Likely his retinal artery occlusion is secondary to significant carotid artery stenosis.  Agree with transitioning to IV Heparin now that INR < 2.  I will have my colleagues review chest CT to look at left atrial appendage and wait for 2D echo assessment but nothing more to do if LAA thrombus present except to run INR slightly higher.  No need to do TEE and it will not impact therapy.  He appears euvolemic on exam.  His BP is stable.  Continue HF meds including Toprol XL 12.5mg  daily, spironolactone 25mg  daily, lasix 80mg  daily.  BP too soft for ARB or ACE I.  His Revised cardiac risk score is 0.9% and therefore acceptable risk to proceed with CEA.  No further ischemic workup needed in setting of ability to exercise > 4 mets with no sx.  Will follow with you.   Signed: Fransico Him, MD [facility] 07/25/2017

## 2017-07-25 NOTE — Progress Notes (Signed)
STROKE TEAM PROGRESS NOTE   SUBJECTIVE (INTERVAL HISTORY) His wife is at the bedside.  Pt sitting in bed but also walked in room without difficulty. Still has right eye lower central vision blurriness. Otherwise, no deficit. Plan for TEE in am and then right CEA after.    OBJECTIVE Temp:  [97.6 F (36.4 C)-98.4 F (36.9 C)] 98 F (36.7 C) (05/19 0900) Pulse Rate:  [70-86] 73 (05/19 0900) Cardiac Rhythm: Ventricular paced (05/19 0800) Resp:  [17-20] 18 (05/19 0900) BP: (104-126)/(65-78) 104/70 (05/19 0900) SpO2:  [93 %-100 %] 93 % (05/19 0900) Weight:  [152 lb 14.4 oz (69.4 kg)] 152 lb 14.4 oz (69.4 kg) (05/19 1100)  CBC:  Recent Labs  Lab 07/23/17 1734 07/25/17 0533  WBC 8.6 7.9  NEUTROABS 5.1  --   HGB 14.2 14.3  HCT 42.0 41.3  MCV 90.7 90.0  PLT 214 161    Basic Metabolic Panel:  Recent Labs  Lab 07/23/17 1734 07/25/17 0533  NA 139 138  K 4.2 3.9  CL 105 106  CO2 27 24  GLUCOSE 87 93  BUN 28* 22*  CREATININE 1.24 0.97  CALCIUM 9.6 9.0    Lipid Panel:     Component Value Date/Time   CHOL 127 07/25/2017 0533   TRIG 69 07/25/2017 0533   HDL 41 07/25/2017 0533   CHOLHDL 3.1 07/25/2017 0533   VLDL 14 07/25/2017 0533   LDLCALC 72 07/25/2017 0533   HgbA1c:  Lab Results  Component Value Date   HGBA1C 5.6 07/25/2017   Urine Drug Screen:     Component Value Date/Time   LABOPIA NONE DETECTED 07/23/2017 1733   COCAINSCRNUR NONE DETECTED 07/23/2017 1733   LABBENZ NONE DETECTED 07/23/2017 1733   AMPHETMU NONE DETECTED 07/23/2017 1733   THCU NONE DETECTED 07/23/2017 1733   LABBARB NONE DETECTED 07/23/2017 1733    Alcohol Level     Component Value Date/Time   ETH <10 07/23/2017 1734    IMAGING I have personally reviewed the radiological images below and agree with the radiology interpretations.  Ct Angio Head W Or Wo Contrast Ct Angio Neck W And/or Wo Contrast 07/24/2017  IMPRESSION:  1. High-grade (at least 70%) atheromatous stenosis at the right  ICA bulb. The plaque is mainly low-density and is ulcerated, a likely embolic source.  2. Non-opacified left atrial appendage, probable clot.  3. 40% narrowing at the left ICA bulb.  4. No intracranial branch occlusion or high-grade stenosis.   Ct Head Wo Contrast 07/23/2017 IMPRESSION:  No acute findings.  No intracranial mass, hemorrhage or edema.   Transthoracic Echocardiogram - Left ventricle: The cavity size was mildly dilated. Wall   thickness was normal. Systolic function was moderately to   severely reduced. The estimated ejection fraction was in the   range of 30% to 35%. Diffuse hypokinesis. The study is not   technically sufficient to allow evaluation of LV diastolic   function. - Aortic valve: There was mild regurgitation. - Aortic root: The aortic root was mildly dilated and moderately   calcified. - Mitral valve: There was mild regurgitation. - Left atrium: The atrium was moderately dilated. - Right atrium: The atrium was moderately dilated. - Pulmonary arteries: PA peak pressure: 31 mm Hg (S). Impressions: - Technically difficult; EF difficult to quantitate; definity used;   moderate to severe global reduction in LV systolic function; mild   LVE; mild AI; mildly dilated aortic root; mild MR; moderate   biatrial enlargement; mild TR.  PHYSICAL EXAM Vitals:   07/24/17 2002 07/25/17 0358 07/25/17 0900 07/25/17 1100  BP: 126/78 111/68 104/70   Pulse: 70 77 73   Resp: 18 20 18    Temp: 98.4 F (36.9 C) 97.6 F (36.4 C) 98 F (36.7 C)   TempSrc: Oral Oral Oral   SpO2: 100% 96% 93%   Weight:    152 lb 14.4 oz (69.4 kg)  Height:    5\' 6"  (1.676 m)    General -  Heart - Regular rate and rhythm - no murmer appreciated Lungs - Clear to auscultation anteriorly Abdomen - Soft - non tender Extremities - Distal pulses intact - no edema Skin - Warm and dry  Mental Status: Alert, oriented, thought content appropriate.  Speech fluent without evidence of aphasia.   Able to follow 3 step commands without difficulty. Cranial Nerves: II: Discs not visualized; Visual fields normal, but decreased visual acuity right eye lower central vision, pupils equal, round, reactive to light. III,IV, VI: ptosis not present, extra-ocular motions intact bilaterally V,VII: smile symmetric, facial light touch sensation normal bilaterally VIII: hearing normal bilaterally IX,X: gag reflex present XI: bilateral shoulder shrug intact. XII: midline tongue extension Motor: RUE - 5/5    LUE - 5/5 RLE - 5/5    LLE -  5/5 Tone and bulk:normal tone throughout; no atrophy noted Sensory: Light touch intact throughout, bilaterally Deep Tendon Reflexes: 2+ and symmetric throughout Plantars: Right: downgoing   Left: downgoing Cerebellar: normal finger-to-nose, normal rapid alternating movements and normal heel-to-shin test Gait: not tested    ASSESSMENT/PLAN [name] is a 84 y.o. male with an extensive cardiac history of atrial fibrillation (coumadin PTA), sick sinus syndrome status post pacemaker implantation and dependency, chronic systolic CHF, CAD, dyslipidemia, history of elevated liver enzymes, polymyalgia rheumatica  presenting with a right visual field deficit felt to be secondary to retinal artery occlusion. He did not receive IV t-PA due to Coumadin therapy.  Right central retinal artery occlusion - likely due to right ICA high grade stenosis.  Resultant  Right lower central vision decreased acuity  CT head - No acute findings.   MRI head - not performed (PPM)  CTA H&N - right ICA 70% stenosis and suspected left atrial appendage clot  2D Echo - EF 30-35% without mention of LAA   Recommend TEE to further evaluate LAA clot  LDL 72  HgbA1c - 5.6  VTE prophylaxis - Heparin IV / coumadin  warfarin daily prior to admission, now on heparin IV. Continue heparin IV for preparation of right CEA  Patient counseled to be compliant with his  antithrombotic medications  Ongoing aggressive stroke risk factor management  Therapy recommendations: none  Disposition:  Pending  Right ICA high grade stenosis  CTA showed right ICA 70% stenosis  Likely the cause of right CRAO  VVS on board  Agree with right CEA ASAP  ?? Left AA thrombus  CTA neck concerning for LAA clot  INR 2.35 on admission  TTE not mentioning of LAA  Recommend TEE to further evaluate  If LAA thrombus confirmed, recommend increase INR goal to 2.5-3.5  Afib, cardiomyopathy, CHF  On coumadin at home  S/p pacer  EF 30-35%  Continue anticoagulation. INR goal 2.5-3.5 if LAA clot confirmed on TEE  Hypertension  Stable . Long-term BP goal normotensive  Hyperlipidemia  Lipid lowering medication PTA: Pravastatin 40 mg daily  LDL 72, goal < 70  now on Lipitor 80 mg daily  Continue statin at  discharge  Other Stroke Risk Factors  Advanced age  Former cigarette smoker - quit  Coronary artery disease  Other Active Problems    Hospital day # 1  Neurology will sign off. Please call with questions. Pt will follow up with stroke clinic NP at [facility] in about 4 weeks. Thanks for the consult.  Rosalin Hawking, MD PhD Stroke Neurology 07/25/2017 6:18 PM   To contact Stroke Continuity provider, please refer to [email or web address] After hours, contact General Neurology

## 2017-07-25 NOTE — Evaluation (Signed)
Physical Therapy Evaluation Patient Details Name: [name] MRN: [id number] DOB: [birth year] Today's Date: 07/25/2017   History of Present Illness  84 year old male with history of atrial fibrillation on Coumadin, coronary artery disease, pacemaker placement, polymyalgia rheumatica, nonischemic cardiomyopathy, hyperlipidemia, complete heart block status post pacemaker placement was sent to the hospital for management of central retinal artery occlusion.  In the hospital on the CT angiogram he was diagnosed with extremely irregular high-grade stenosis of at least 90% of the ICA. Plan is right carotid endarterectomy 07/26/17 if cleared from cardiac standpoint.  Clinical Impression  Patient seen by OT, states overall independent with all activity, No Acute PT needs identified. Screened and signed off by PT.     Alben Deeds, PT DPT  Board Certified Neurologic Specialist [phone]

## 2017-07-25 NOTE — Progress Notes (Signed)
PROGRESS NOTE    [name]  BHA:[id number] DOB: September 23, 1929 DOA: 07/23/2017 PCP: [name], MD   Brief Narrative:   84 year old male with history of atrial fibrillation on Coumadin, coronary artery disease, pacemaker placement, polymyalgia rheumatica, nonischemic cardiomyopathy, hyperlipidemia, complete heart block status post pacemaker placement was sent to the hospital for management of central retinal artery occlusion.  Patient states he suddenly developed decreased vision in the right eye on 5/17 and was seen by ophthalmologist who diagnosed him with central retinal artery occlusion and was sent to the hospital.  In the hospital on the CT angiogram he was diagnosed with extremely irregular high-grade stenosis of at least 90% of the ICA.  Evaluated by neurology as well.  Vascular surgery recommended getting cardiac clearance prior to proceeding with management for the ICA stenosis.  Assessment & Plan:   Principal Problem:   Central retinal artery occlusion Active Problems:   Atrial fibrillation (HCC)   Polymyalgia rheumatica (HCC)   Thrombus of left atrial appendage without antecedent myocardial infarction   Right-sided vision loss secondary to right central retinal artery occlusion due to high-grade stenosis/ulceration of ICA - We will hold patient's Coumadin and transition him to heparin drip in anticipation for vascular intervention tomorrow.  His INR is drifting down at this point -Patient appears to be improving. -Echocardiogram has been ordered for risk stratification from cardiac perspective -Cardiology is following, appreciate vascular surgery input  History of atrial fibrillation with possible left atrial appendage thrombus -She is on Coumadin at home.  Currently is on hold in anticipation for surgery, will transition him to heparin drip.  Echocardiogram ordered.  History of complete heart block status post pacemaker in place Ischemic cardiomyopathy, chronic  systolic congestive heart failure, ejection fraction 30% -Currently appears to be euvolemic without any signs of fluid overload.  We will continue his Toprol-XL 12.5 mg orally daily, Aldactone 25 mg orally daily, Lipitor 80 mg daily - No recent echocardiogram on file therefore it has been ordered. - Cardiology team has been consulted  Hyperlipidemia -Continue Lipitor 80 mg at bedtime  History of polymyalgia rheumatica -Continue his home dose of prednisone  BPH -Continue Flomax   DVT prophylaxis: Heparin drip Code Status: Full code Family Communication: None at bedside Disposition Plan: To be determined  Consultants:   Vascular surgery  Urology  Cardiology  Procedures:   None  Antimicrobials:   None   Subjective: Patient states his vision in the right eye has improved greatly since being admitted.  No other complaints.  Review of Systems Otherwise negative except as per HPI, including: General: Denies fever, chills, night sweats or unintended weight loss. Resp: Denies cough, wheezing, shortness of breath. Cardiac: Denies chest pain, palpitations, orthopnea, paroxysmal nocturnal dyspnea. GI: Denies abdominal pain, nausea, vomiting, diarrhea or constipation GU: Denies dysuria, frequency, hesitancy or incontinence MS: Denies muscle aches, joint pain or swelling Neuro: Denies headache, neurologic deficits (focal weakness, numbness, tingling), abnormal gait Psych: Denies anxiety, depression, SI/HI/AVH Skin: Denies new rashes or lesions ID: Denies sick contacts, exotic exposures, travel  Objective: Vitals:   07/24/17 1800 07/24/17 2002 07/25/17 0358 07/25/17 0900  BP:  126/78 111/68 104/70  Pulse: 75 70 77 73  Resp: 18 18 20 18   Temp:  98.4 F (36.9 C) 97.6 F (36.4 C) 98 F (36.7 C)  TempSrc:  Oral Oral Oral  SpO2: 95% 100% 96% 93%   No intake or output data in the 24 hours ending 07/25/17 1035 There were no vitals filed  for this  visit.  Examination:  General exam: Appears calm and comfortable  Respiratory system: Clear to auscultation. Respiratory effort normal. Cardiovascular system: S1 & S2 heard, RRR. No JVD, murmurs, rubs, gallops or clicks. No pedal edema. Right carotid bruit.  Gastrointestinal system: Abdomen is nondistended, soft and nontender. No organomegaly or masses felt. Normal bowel sounds heard. Central nervous system: Alert and oriented. No focal neurological deficits. Extremities: Symmetric 5 x 5 power. Skin: No rashes, lesions or ulcers Psychiatry: Judgement and insight appear normal. Mood & affect appropriate.     Data Reviewed:   CBC: Recent Labs  Lab 07/23/17 1734 07/25/17 0533  WBC 8.6 7.9  NEUTROABS 5.1  --   HGB 14.2 14.3  HCT 42.0 41.3  MCV 90.7 90.0  PLT 214 782   Basic Metabolic Panel: Recent Labs  Lab 07/23/17 1734 07/25/17 0533  NA 139 138  K 4.2 3.9  CL 105 106  CO2 27 24  GLUCOSE 87 93  BUN 28* 22*  CREATININE 1.24 0.97  CALCIUM 9.6 9.0   GFR: CrCl cannot be calculated (Unknown ideal weight.). Liver Function Tests: Recent Labs  Lab 07/23/17 1734  AST 29  ALT 22  ALKPHOS 62  BILITOT 1.3*  PROT 6.8  ALBUMIN 4.2   No results for input(s): LIPASE, AMYLASE in the last 168 hours. No results for input(s): AMMONIA in the last 168 hours. Coagulation Profile: Recent Labs  Lab 07/23/17 1734 07/24/17 1146 07/25/17 0533  INR 2.35 2.16 1.86   Cardiac Enzymes: No results for input(s): CKTOTAL, CKMB, CKMBINDEX, TROPONINI in the last 168 hours. BNP (last 3 results) No results for input(s): PROBNP in the last 8760 hours. HbA1C: Recent Labs    07/24/17 0726 07/25/17 0533  HGBA1C 5.6 5.6   CBG: No results for input(s): GLUCAP in the last 168 hours. Lipid Profile: Recent Labs    07/24/17 0726 07/25/17 0533  CHOL 130 127  HDL 45 41  LDLCALC 76 72  TRIG 47 69  CHOLHDL 2.9 3.1   Thyroid Function Tests: No results for input(s): TSH, T4TOTAL,  FREET4, T3FREE, THYROIDAB in the last 72 hours. Anemia Panel: No results for input(s): VITAMINB12, FOLATE, FERRITIN, TIBC, IRON, RETICCTPCT in the last 72 hours. Sepsis Labs: No results for input(s): PROCALCITON, LATICACIDVEN in the last 168 hours.  No results found for this or any previous visit (from the past 240 hour(s)).       Radiology Studies: Ct Angio Head W Or Wo Contrast  Result Date: 07/24/2017 CLINICAL DATA:  Retinal vascular occlusion. Vision loss on the right. EXAM: CT ANGIOGRAPHY HEAD AND NECK TECHNIQUE: Multidetector CT imaging of the head and neck was performed using the standard protocol during bolus administration of intravenous contrast. Multiplanar CT image reconstructions and MIPs were obtained to evaluate the vascular anatomy. Carotid stenosis measurements (when applicable) are obtained utilizing NASCET criteria, using the distal internal carotid diameter as the denominator. CONTRAST:  88mL ISOVUE-370 IOPAMIDOL (ISOVUE-370) INJECTION 76% COMPARISON:  Head CT from yesterday FINDINGS: CTA NECK FINDINGS Aortic arch: Three vessel branching with mild atherosclerotic plaque. Right carotid system: Prominent mainly low-density atherosclerotic plaque and/or thrombus at the ICA bulb with at least 70% stenosis. There is positive remodeling at the level of plaque. On coronal reformats a discrete ulceration is noted. Left carotid system: Moderate mainly calcified plaque at the ICA bulb primarily, with no flow limiting stenosis or ulceration. Stenosis measures 40% at the ICA bulb. Vertebral arteries: No proximal subclavian flow limiting stenosis. The left vertebral  artery is dominant. Both vertebral arteries are smooth and diffusely patent to the dura Skeleton: No acute or aggressive finding. Other neck: No significant incidental finding. Upper chest: Airway thickening and left hilar calcifications. Dual-chamber pacer lead.Non opacified left atrial appendage despite good opacification of the  visualized left heart and aorta. Coronary atherosclerosis. Review of the MIP images confirms the above findings CTA HEAD FINDINGS Anterior circulation: Atherosclerotic plaque on the carotid siphons without flow limiting stenosis. Hypoplastic right A1 segment. A broad anterior communicating artery is present. Tiny outpouching from the left supraclinoid ICA, likely infundibulum based on shape. Posterior circulation: Left vertebral artery dominance. Left V4 segment calcified plaque. No flow limiting stenosis or branch occlusion. Venous sinuses: Patent Anatomic variants: As above Delayed phase: No abnormal intracranial enhancement Review of the MIP images confirms the above findings IMPRESSION: 1. High-grade (at least 70%) atheromatous stenosis at the right ICA bulb. The plaque is mainly low-density and is ulcerated, a likely embolic source. 2. Non-opacified left atrial appendage, probable clot. 3. 40% narrowing at the left ICA bulb. 4. No intracranial branch occlusion or high-grade stenosis. Electronically Signed   By: Monte Fantasia M.D.   On: 07/24/2017 09:03   Ct Head Wo Contrast  Result Date: 07/23/2017 CLINICAL DATA:  Visual changes and RIGHT eye starting this morning. EXAM: CT HEAD WITHOUT CONTRAST TECHNIQUE: Contiguous axial images were obtained from the base of the skull through the vertex without intravenous contrast. COMPARISON:  None. FINDINGS: Brain: Mild generalized age related parenchymal atrophy with commensurate dilatation of the ventricles and sulci. Small old lacunar infarct within the RIGHT basal ganglia. There is no mass, hemorrhage, edema or other evidence of acute parenchymal abnormality. No extra-axial hemorrhage. Vascular: There are chronic calcified atherosclerotic changes of the large vessels at the skull base. No unexpected hyperdense vessel. Skull: Normal. Negative for fracture or focal lesion. Sinuses/Orbits: Periorbital and retro-orbital soft tissues are unremarkable. Orbital globes  appear symmetric in position and configuration. Visualized upper paranasal sinuses are clear. Other: None. IMPRESSION: No acute findings.  No intracranial mass, hemorrhage or edema. Electronically Signed   By: Franki Cabot M.D.   On: 07/23/2017 18:18   Ct Angio Neck W And/or Wo Contrast  Result Date: 07/24/2017 CLINICAL DATA:  Retinal vascular occlusion. Vision loss on the right. EXAM: CT ANGIOGRAPHY HEAD AND NECK TECHNIQUE: Multidetector CT imaging of the head and neck was performed using the standard protocol during bolus administration of intravenous contrast. Multiplanar CT image reconstructions and MIPs were obtained to evaluate the vascular anatomy. Carotid stenosis measurements (when applicable) are obtained utilizing NASCET criteria, using the distal internal carotid diameter as the denominator. CONTRAST:  57mL ISOVUE-370 IOPAMIDOL (ISOVUE-370) INJECTION 76% COMPARISON:  Head CT from yesterday FINDINGS: CTA NECK FINDINGS Aortic arch: Three vessel branching with mild atherosclerotic plaque. Right carotid system: Prominent mainly low-density atherosclerotic plaque and/or thrombus at the ICA bulb with at least 70% stenosis. There is positive remodeling at the level of plaque. On coronal reformats a discrete ulceration is noted. Left carotid system: Moderate mainly calcified plaque at the ICA bulb primarily, with no flow limiting stenosis or ulceration. Stenosis measures 40% at the ICA bulb. Vertebral arteries: No proximal subclavian flow limiting stenosis. The left vertebral artery is dominant. Both vertebral arteries are smooth and diffusely patent to the dura Skeleton: No acute or aggressive finding. Other neck: No significant incidental finding. Upper chest: Airway thickening and left hilar calcifications. Dual-chamber pacer lead.Non opacified left atrial appendage despite good opacification of the visualized left heart  and aorta. Coronary atherosclerosis. Review of the MIP images confirms the above  findings CTA HEAD FINDINGS Anterior circulation: Atherosclerotic plaque on the carotid siphons without flow limiting stenosis. Hypoplastic right A1 segment. A broad anterior communicating artery is present. Tiny outpouching from the left supraclinoid ICA, likely infundibulum based on shape. Posterior circulation: Left vertebral artery dominance. Left V4 segment calcified plaque. No flow limiting stenosis or branch occlusion. Venous sinuses: Patent Anatomic variants: As above Delayed phase: No abnormal intracranial enhancement Review of the MIP images confirms the above findings IMPRESSION: 1. High-grade (at least 70%) atheromatous stenosis at the right ICA bulb. The plaque is mainly low-density and is ulcerated, a likely embolic source. 2. Non-opacified left atrial appendage, probable clot. 3. 40% narrowing at the left ICA bulb. 4. No intracranial branch occlusion or high-grade stenosis. Electronically Signed   By: Monte Fantasia M.D.   On: 07/24/2017 09:03        Scheduled Meds: . atorvastatin  80 mg Oral q1800  . furosemide  80 mg Oral Daily  . metoprolol succinate  12.5 mg Oral Daily  . potassium chloride SA  20 mEq Oral BID  . predniSONE  1 mg Oral Q breakfast  . sodium chloride flush  3 mL Intravenous Q12H  . spironolactone  25 mg Oral Daily  . tamsulosin  0.4 mg Oral Daily   Continuous Infusions: . sodium chloride    . heparin       LOS: 1 day    I have spent 35 minutes face to face with the patient and on the ward discussing the patients care, assessment, plan and disposition with other care givers. >50% of the time was devoted counseling the patient about the risks and benefits of treatment and coordinating care.     Ankit Arsenio Loader, MD [facility] Pager [phone]   If 7PM-7AM, please contact night-coverage [email or web address] Password [email or web address] 07/25/2017, 10:35 AM

## 2017-07-25 NOTE — Progress Notes (Signed)
ANTICOAGULATION CONSULT NOTE - Follow Up Consult  Pharmacy Consult for Heparin Indication: atrial fibrillation  Allergies  Allergen Reactions  . Antihistamines, Diphenhydramine-Type Other (See Comments)    Causes difficulty in ability to urinate.    Patient Measurements: Height: 5\' 6"  (167.6 cm) Weight: 152 lb 14.4 oz (69.4 kg) IBW/kg (Calculated) : 63.8  Vital Signs: Temp: 98 F (36.7 C) (05/19 0900) Temp Source: Oral (05/19 0900) BP: 104/70 (05/19 0900) Pulse Rate: 73 (05/19 0900)  Labs: Recent Labs    07/23/17 1734 07/24/17 1146 07/25/17 0533  HGB 14.2  --  14.3  HCT 42.0  --  41.3  PLT 214  --  211  APTT 40*  --   --   LABPROT 25.5* 23.9* 21.3*  INR 2.35 2.16 1.86  CREATININE 1.24  --  0.97    Estimated Creatinine Clearance: 49.3 mL/min (by C-G formula based on SCr of 0.97 mg/dL).  Assessment: 84 year old male on warfarin prior to admission for Afib, INR = 1.86, Warfarin currently on hold with plans for endarterectomy for central retinal artery occlusion  Warfarin dose prior to admission = 2.5 mg on Saturdays, 5 mg other days  Goal of Therapy:  Monitor platelets by anticoagulation protocol: Yes  Heparin level = 0.3 to 0.7   Plan:  Warfarin on hold Heparin drip at 1050 units / hr 8 hour heparin  Daily heparin level, CBC  Thank you Anette Guarneri, PharmD [phone] 07/25/2017,11:09 AM

## 2017-07-25 NOTE — Progress Notes (Signed)
SLP Cancellation Note  Patient Details Name: LEEON MAKAR MRN: [id number] DOB: 1930/02/23   Cancelled treatment:       Reason Eval/Treat Not Completed: SLP screened, no needs identified, will sign off. Spoke with pt and family; expressive and receptive language functional for mod complex conversation, functional recall appears adequate. Pt and family report he is at baseline level of function; will s/o.  Deneise Lever, [place], [place] Speech-Language Pathologist [place] 07/25/2017, 5:49 PM

## 2017-07-25 NOTE — Progress Notes (Signed)
   Pt ambulating in room independently including navigating obstacles during OT evaluation. Pt ok from OT standpoint to ambulate independently within the room.  Tyrone Schimke OTR/L Pager: [phone]

## 2017-07-25 NOTE — Evaluation (Signed)
Occupational Therapy Evaluation Patient Details Name: [name] MRN: [id number] DOB: June 06, 1929 Today's Date: 07/25/2017    History of Present Illness 84 year old male with history of atrial fibrillation on Coumadin, coronary artery disease, pacemaker placement, polymyalgia rheumatica, nonischemic cardiomyopathy, hyperlipidemia, complete heart block status post pacemaker placement was sent to the hospital for management of central retinal artery occlusion.  In the hospital on the CT angiogram he was diagnosed with extremely irregular high-grade stenosis of at least 90% of the ICA. Plan is right carotid endarterectomy 07/26/17 if cleared from cardiac standpoint.   Clinical Impression   Pt admitted with the above diagnoses and presents with below problem list. PTA pt was independent with ADLs and very active. Lives at [facility]. Pt is at baseline with ADLs. Discussed strategies for completing ADLs/IADLs with R vision impairments. Pt with good awareness of deficit and strategies to compensate. No further acute OT needs identified. OT signing off.     Follow Up Recommendations  No OT follow up    Equipment Recommendations  None recommended by OT    Recommendations for Other Services       Precautions / Restrictions Restrictions Weight Bearing Restrictions: No      Mobility Bed Mobility               General bed mobility comments: up in chair  Transfers Overall transfer level: Independent Equipment used: None                  Balance Overall balance assessment: No apparent balance deficits (not formally assessed)                                         ADL either performed or assessed with clinical judgement   ADL Overall ADL's : Independent;At baseline                                       General ADL Comments: Pt able to ambulate quickly around obstacles in the room to go to/from bathroom. Discussed strategies for  managing ADLs with R vision impairment     Vision Baseline Vision/History: Wears glasses Wears Glasses: At all times Patient Visual Report: Central vision impairment;Other (comment)(R eye) Additional Comments: Pt very aware of visual field loss and demonstrates ability to compensate during ADLs and functinoal mobility/transfers.      Perception     Praxis      Pertinent Vitals/Pain Pain Assessment: No/denies pain     Hand Dominance     Extremity/Trunk Assessment Upper Extremity Assessment Upper Extremity Assessment: Overall WFL for tasks assessed   Lower Extremity Assessment Lower Extremity Assessment: Overall WFL for tasks assessed       Communication Communication Communication: No difficulties   Cognition Arousal/Alertness: Awake/alert Behavior During Therapy: WFL for tasks assessed/performed Overall Cognitive Status: Within Functional Limits for tasks assessed                                     General Comments       Exercises     Shoulder Instructions      Home Living Family/patient expects to be discharged to:: Private residence Living Arrangements: Spouse/significant other Available Help at Discharge: Family Type of Home: Independent  living facility Home Access: Level entry     Home Layout: One level     Bathroom Shower/Tub: Chief Strategy Officer: Shower seat          Prior Functioning/Environment Level of Independence: Independent        Comments: very active        OT Problem List:        OT Treatment/Interventions:      OT Goals(Current goals can be found in the care plan section) Acute Rehab OT Goals Patient Stated Goal: home and back to [place]  OT Frequency:     Barriers to D/C:            Co-evaluation              AM-PAC PT "6 Clicks" Daily Activity     Outcome Measure Help from another person eating meals?: None Help from another person taking care of personal grooming?:  None Help from another person toileting, which includes using toliet, bedpan, or urinal?: None Help from another person bathing (including washing, rinsing, drying)?: None Help from another person to put on and taking off regular upper body clothing?: None Help from another person to put on and taking off regular lower body clothing?: None 6 Click Score: 24   End of Session    Activity Tolerance: Patient tolerated treatment well Patient left: in chair  OT Visit Diagnosis: Other (comment)(vision)                Time: 6195-0932 OT Time Calculation (min): 12 min Charges:  OT General Charges $OT Visit: 1 Visit OT Evaluation $OT Eval Low Complexity: 1 Low G-Codes:       Hortencia Pilar 07/25/2017, 2:21 PM

## 2017-07-25 NOTE — Progress Notes (Signed)
  Echocardiogram 2D Echocardiogram has been performed.  [name] 07/25/2017, 1:38 PM

## 2017-07-25 NOTE — H&P (View-Only) (Signed)
Patient ID: [name], male   DOB: February 24, 1930, 84 y.o.   MRN: [id number] Comfortable this morning.  Left well.  No new neurologic deficits Discussed with patient and cardiology physician assistant present.  INR has normalized.  Will start IV heparin and hold Coumadin.  Can proceed with right carotid endarterectomy tomorrow if cleared from cardiac standpoint.  Again explained to the patient that there is no urgency to do this tomorrow but due to the issue regarding anticoagulation would be most convenient to care for this while he is here.  No contraindication to endarterectomy [name] after his retinal artery event.  Echocardiogram pending today.  Has been posted for right carotid endarterectomy tomorrow.  Will cancel if there is felt to be cardiac contra indications.

## 2017-07-26 ENCOUNTER — Telehealth: Payer: Self-pay | Admitting: Vascular Surgery

## 2017-07-26 ENCOUNTER — Encounter (HOSPITAL_COMMUNITY)
Admission: EM | Disposition: A | Discharge status: Home / Self Care | Payer: Self-pay | Source: Home / Self Care | Attending: Internal Medicine

## 2017-07-26 ENCOUNTER — Inpatient Hospital Stay (HOSPITAL_COMMUNITY): Payer: Medicare Other | Admitting: Anesthesiology

## 2017-07-26 DIAGNOSIS — I6521 Occlusion and stenosis of right carotid artery: Secondary | ICD-10-CM

## 2017-07-26 HISTORY — PX: PATCH ANGIOPLASTY: SHX6230

## 2017-07-26 HISTORY — PX: ENDARTERECTOMY: SHX5162

## 2017-07-26 LAB — PROTIME-INR
INR: 2.01
PROTHROMBIN TIME: 22.6 s — AB (ref 11.4–15.2)

## 2017-07-26 LAB — TYPE AND SCREEN
ABO/RH(D): A POS
ANTIBODY SCREEN: NEGATIVE

## 2017-07-26 LAB — CBC
HEMATOCRIT: 40 % (ref 39.0–52.0)
Hemoglobin: 13.6 g/dL (ref 13.0–17.0)
MCH: 30.8 pg (ref 26.0–34.0)
MCHC: 34 g/dL (ref 30.0–36.0)
MCV: 90.5 fL (ref 78.0–100.0)
PLATELETS: 213 10*3/uL (ref 150–400)
RBC: 4.42 MIL/uL (ref 4.22–5.81)
RDW: 13.4 % (ref 11.5–15.5)
WBC: 7.7 10*3/uL (ref 4.0–10.5)

## 2017-07-26 LAB — HEPARIN LEVEL (UNFRACTIONATED): HEPARIN UNFRACTIONATED: 0.25 [IU]/mL — AB (ref 0.30–0.70)

## 2017-07-26 SURGERY — ENDARTERECTOMY, CAROTID
Anesthesia: General | Site: Neck | Laterality: Right

## 2017-07-26 MED ORDER — METOPROLOL TARTRATE 12.5 MG HALF TABLET
ORAL_TABLET | ORAL | Status: AC
  Filled 2017-07-26: qty 1

## 2017-07-26 MED ORDER — LIDOCAINE 2% (20 MG/ML) 5 ML SYRINGE
INTRAMUSCULAR | Status: AC
Start: 2017-07-26 — End: ?
  Filled 2017-07-26: qty 5

## 2017-07-26 MED ORDER — LABETALOL HCL 5 MG/ML IV SOLN: 10.0000 mg | INTRAVENOUS | Status: DC | PRN

## 2017-07-26 MED ORDER — OXYCODONE HCL 5 MG PO TABS: 5.0000 mg | ORAL_TABLET | ORAL | Status: DC | PRN

## 2017-07-26 MED ORDER — SUGAMMADEX SODIUM 200 MG/2ML IV SOLN
INTRAVENOUS | Status: AC
  Filled 2017-07-26: qty 2

## 2017-07-26 MED ORDER — PROTAMINE SULFATE 10 MG/ML IV SOLN
INTRAVENOUS | Status: DC | PRN
  Administered 2017-07-26: 50 mg via INTRAVENOUS

## 2017-07-26 MED ORDER — ALUM & MAG HYDROXIDE-SIMETH 200-200-20 MG/5ML PO SUSP: 15.0000 mL | ORAL | Status: DC | PRN

## 2017-07-26 MED ORDER — SODIUM CHLORIDE 0.9 % IV SOLN
0.0125 ug/kg/min | INTRAVENOUS | Status: AC
  Administered 2017-07-26: .2 ug/kg/min via INTRAVENOUS
  Filled 2017-07-26 (×2): qty 2000

## 2017-07-26 MED ORDER — ONDANSETRON HCL 4 MG/2ML IJ SOLN: 4.0000 mg | Freq: Four times a day (QID) | INTRAMUSCULAR | Status: DC | PRN

## 2017-07-26 MED ORDER — PROTAMINE SULFATE 10 MG/ML IV SOLN
INTRAVENOUS | Status: AC
  Filled 2017-07-26: qty 5

## 2017-07-26 MED ORDER — FENTANYL CITRATE (PF) 100 MCG/2ML IJ SOLN
25.0000 ug | INTRAMUSCULAR | Status: DC | PRN
  Administered 2017-07-26: 50 ug via INTRAVENOUS
  Administered 2017-07-26 (×2): 25 ug via INTRAVENOUS

## 2017-07-26 MED ORDER — BISACODYL 5 MG PO TBEC: 5.0000 mg | DELAYED_RELEASE_TABLET | Freq: Every day | ORAL | Status: DC | PRN

## 2017-07-26 MED ORDER — PROPOFOL 10 MG/ML IV BOLUS
INTRAVENOUS | Status: DC | PRN
  Administered 2017-07-26: 130 mg via INTRAVENOUS

## 2017-07-26 MED ORDER — MAGNESIUM SULFATE 2 GM/50ML IV SOLN: 2.0000 g | Freq: Every day | INTRAVENOUS | Status: DC | PRN

## 2017-07-26 MED ORDER — ONDANSETRON HCL 4 MG/2ML IJ SOLN
INTRAMUSCULAR | Status: AC
  Filled 2017-07-26: qty 2

## 2017-07-26 MED ORDER — SUGAMMADEX SODIUM 200 MG/2ML IV SOLN
INTRAVENOUS | Status: DC | PRN
  Administered 2017-07-26: 140 mg via INTRAVENOUS

## 2017-07-26 MED ORDER — ACETAMINOPHEN 325 MG PO TABS
325.0000 mg | ORAL_TABLET | ORAL | Status: DC | PRN
  Administered 2017-07-26 – 2017-07-27 (×3): 650 mg via ORAL
  Filled 2017-07-26 (×3): qty 2

## 2017-07-26 MED ORDER — SENNOSIDES-DOCUSATE SODIUM 8.6-50 MG PO TABS: 1.0000 | ORAL_TABLET | Freq: Every evening | ORAL | Status: DC | PRN

## 2017-07-26 MED ORDER — EPHEDRINE SULFATE 50 MG/ML IJ SOLN
INTRAMUSCULAR | Status: DC | PRN
  Administered 2017-07-26: 10 mg via INTRAVENOUS

## 2017-07-26 MED ORDER — ONDANSETRON HCL 4 MG/2ML IJ SOLN
INTRAMUSCULAR | Status: DC | PRN
  Administered 2017-07-26: 4 mg via INTRAVENOUS

## 2017-07-26 MED ORDER — LIDOCAINE 2% (20 MG/ML) 5 ML SYRINGE
INTRAMUSCULAR | Status: AC
  Filled 2017-07-26: qty 5

## 2017-07-26 MED ORDER — MORPHINE SULFATE (PF) 2 MG/ML IV SOLN: 1.0000 mg | INTRAVENOUS | Status: DC | PRN

## 2017-07-26 MED ORDER — DEXAMETHASONE SODIUM PHOSPHATE 10 MG/ML IJ SOLN
INTRAMUSCULAR | Status: DC | PRN
  Administered 2017-07-26: 10 mg via INTRAVENOUS

## 2017-07-26 MED ORDER — FENTANYL CITRATE (PF) 250 MCG/5ML IJ SOLN
INTRAMUSCULAR | Status: AC
  Filled 2017-07-26: qty 5

## 2017-07-26 MED ORDER — PHENYLEPHRINE HCL 10 MG/ML IJ SOLN
INTRAVENOUS | Status: DC | PRN
  Administered 2017-07-26: 30 ug/min via INTRAVENOUS

## 2017-07-26 MED ORDER — PANTOPRAZOLE SODIUM 40 MG PO TBEC
40.0000 mg | DELAYED_RELEASE_TABLET | Freq: Every day | ORAL | Status: DC
  Administered 2017-07-27: 40 mg via ORAL
  Filled 2017-07-26: qty 1

## 2017-07-26 MED ORDER — LIDOCAINE HCL (CARDIAC) PF 100 MG/5ML IV SOSY
PREFILLED_SYRINGE | INTRAVENOUS | Status: DC | PRN
  Administered 2017-07-26: 60 mg via INTRAVENOUS

## 2017-07-26 MED ORDER — METOPROLOL SUCCINATE ER 25 MG PO TB24: 12.5000 mg | ORAL_TABLET | Freq: Every day | ORAL | Status: DC

## 2017-07-26 MED ORDER — ACETAMINOPHEN 325 MG RE SUPP: 325.0000 mg | RECTAL | Status: DC | PRN

## 2017-07-26 MED ORDER — PHENOL 1.4 % MT LIQD
1.0000 | OROMUCOSAL | Status: DC | PRN
  Administered 2017-07-26: 1 via OROMUCOSAL
  Filled 2017-07-26: qty 177

## 2017-07-26 MED ORDER — METOPROLOL TARTRATE 5 MG/5ML IV SOLN: 2.0000 mg | INTRAVENOUS | Status: DC | PRN

## 2017-07-26 MED ORDER — FENTANYL CITRATE (PF) 100 MCG/2ML IJ SOLN
INTRAMUSCULAR | Status: AC
  Administered 2017-07-26: 25 ug via INTRAVENOUS
  Filled 2017-07-26: qty 2

## 2017-07-26 MED ORDER — ROCURONIUM BROMIDE 100 MG/10ML IV SOLN
INTRAVENOUS | Status: DC | PRN
  Administered 2017-07-26: 50 mg via INTRAVENOUS

## 2017-07-26 MED ORDER — PHENYLEPHRINE HCL 10 MG/ML IJ SOLN
INTRAMUSCULAR | Status: DC | PRN
  Administered 2017-07-26: 200 ug via INTRAVENOUS
  Administered 2017-07-26: 80 ug via INTRAVENOUS
  Administered 2017-07-26: 120 ug via INTRAVENOUS

## 2017-07-26 MED ORDER — METOPROLOL SUCCINATE 12.5 MG HALF TABLET
12.5000 mg | ORAL_TABLET | Freq: Every day | ORAL | Status: DC
  Administered 2017-07-26 – 2017-07-27 (×2): 12.5 mg via ORAL
  Filled 2017-07-26 (×2): qty 1

## 2017-07-26 MED ORDER — SODIUM CHLORIDE 0.9 % IV SOLN
INTRAVENOUS | Status: AC
  Filled 2017-07-26: qty 1.2

## 2017-07-26 MED ORDER — POTASSIUM CHLORIDE CRYS ER 20 MEQ PO TBCR: 20.0000 meq | EXTENDED_RELEASE_TABLET | Freq: Every day | ORAL | Status: DC | PRN

## 2017-07-26 MED ORDER — SODIUM CHLORIDE 0.9 % IV SOLN
INTRAVENOUS | Status: DC | PRN
  Administered 2017-07-26: 500 mL

## 2017-07-26 MED ORDER — SODIUM CHLORIDE 0.9 % IV SOLN
INTRAVENOUS | Status: DC
  Administered 2017-07-26: 15:00:00 via INTRAVENOUS

## 2017-07-26 MED ORDER — HYDRALAZINE HCL 20 MG/ML IJ SOLN: 5.0000 mg | INTRAMUSCULAR | Status: DC | PRN

## 2017-07-26 MED ORDER — ASPIRIN EC 325 MG PO TBEC
325.0000 mg | DELAYED_RELEASE_TABLET | Freq: Every day | ORAL | Status: DC
  Administered 2017-07-27: 325 mg via ORAL
  Filled 2017-07-26: qty 1

## 2017-07-26 MED ORDER — SODIUM CHLORIDE 0.9 % IV SOLN: 500.0000 mL | Freq: Once | INTRAVENOUS | Status: DC | PRN

## 2017-07-26 MED ORDER — PROPOFOL 10 MG/ML IV BOLUS
INTRAVENOUS | Status: AC
  Filled 2017-07-26: qty 20

## 2017-07-26 MED ORDER — DEXAMETHASONE SODIUM PHOSPHATE 10 MG/ML IJ SOLN
INTRAMUSCULAR | Status: AC
  Filled 2017-07-26: qty 1

## 2017-07-26 MED ORDER — LACTATED RINGERS IV SOLN
INTRAVENOUS | Status: DC | PRN
  Administered 2017-07-26: 11:00:00 via INTRAVENOUS

## 2017-07-26 MED ORDER — GUAIFENESIN-DM 100-10 MG/5ML PO SYRP: 15.0000 mL | ORAL_SOLUTION | ORAL | Status: DC | PRN

## 2017-07-26 MED ORDER — PROMETHAZINE HCL 25 MG/ML IJ SOLN: 6.2500 mg | INTRAMUSCULAR | Status: DC | PRN

## 2017-07-26 MED ORDER — LOSARTAN POTASSIUM 25 MG PO TABS
12.5000 mg | ORAL_TABLET | Freq: Every day | ORAL | Status: DC
  Administered 2017-07-27: 12.5 mg via ORAL
  Filled 2017-07-26: qty 0.5
  Filled 2017-07-26: qty 1

## 2017-07-26 MED ORDER — ONDANSETRON HCL 4 MG/2ML IJ SOLN
INTRAMUSCULAR | Status: AC
Start: 2017-07-26 — End: ?
  Filled 2017-07-26: qty 2

## 2017-07-26 MED ORDER — ROCURONIUM BROMIDE 10 MG/ML (PF) SYRINGE
PREFILLED_SYRINGE | INTRAVENOUS | Status: AC
  Filled 2017-07-26: qty 5

## 2017-07-26 MED ORDER — DOCUSATE SODIUM 100 MG PO CAPS
100.0000 mg | ORAL_CAPSULE | Freq: Every day | ORAL | Status: DC
  Administered 2017-07-27: 100 mg via ORAL
  Filled 2017-07-26: qty 1

## 2017-07-26 MED ORDER — 0.9 % SODIUM CHLORIDE (POUR BTL) OPTIME
TOPICAL | Status: DC | PRN
  Administered 2017-07-26: 2000 mL

## 2017-07-26 MED ORDER — HEPARIN SODIUM (PORCINE) 1000 UNIT/ML IJ SOLN
INTRAMUSCULAR | Status: DC | PRN
  Administered 2017-07-26: 7000 [IU] via INTRAVENOUS

## 2017-07-26 MED ORDER — CEFAZOLIN SODIUM-DEXTROSE 2-4 GM/100ML-% IV SOLN
2.0000 g | Freq: Three times a day (TID) | INTRAVENOUS | Status: AC
  Administered 2017-07-26 – 2017-07-27 (×2): 2 g via INTRAVENOUS
  Filled 2017-07-26 (×3): qty 100

## 2017-07-26 MED ORDER — FENTANYL CITRATE (PF) 100 MCG/2ML IJ SOLN
INTRAMUSCULAR | Status: DC | PRN
  Administered 2017-07-26: 50 ug via INTRAVENOUS

## 2017-07-26 MED ORDER — LACTATED RINGERS IV SOLN
Freq: Once | INTRAVENOUS | Status: AC
  Administered 2017-07-26: 10:00:00 via INTRAVENOUS

## 2017-07-26 MED ORDER — LIDOCAINE HCL (PF) 1 % IJ SOLN
INTRAMUSCULAR | Status: AC
Start: 2017-07-26 — End: ?
  Filled 2017-07-26: qty 30

## 2017-07-26 SURGICAL SUPPLY — 45 items
ADH SKN CLS APL DERMABOND .7 (GAUZE/BANDAGES/DRESSINGS) ×1
CANISTER SUCT 3000ML PPV (MISCELLANEOUS) ×3 IMPLANT
CANNULA VESSEL 3MM 2 BLNT TIP (CANNULA) ×6 IMPLANT
CATH ROBINSON RED A/P 18FR (CATHETERS) ×3 IMPLANT
CLIP LIGATING EXTRA MED SLVR (CLIP) ×3 IMPLANT
CLIP LIGATING EXTRA SM BLUE (MISCELLANEOUS) ×3 IMPLANT
CRADLE DONUT ADULT HEAD (MISCELLANEOUS) ×3 IMPLANT
DECANTER SPIKE VIAL GLASS SM (MISCELLANEOUS) IMPLANT
DERMABOND ADVANCED (GAUZE/BANDAGES/DRESSINGS) ×2
DERMABOND ADVANCED .7 DNX12 (GAUZE/BANDAGES/DRESSINGS) ×1 IMPLANT
DRAIN HEMOVAC 1/8 X 5 (WOUND CARE) IMPLANT
ELECT REM PT RETURN 9FT ADLT (ELECTROSURGICAL) ×3
ELECTRODE REM PT RTRN 9FT ADLT (ELECTROSURGICAL) ×1 IMPLANT
EVACUATOR SILICONE 100CC (DRAIN) IMPLANT
GLOVE BIO SURGEON STRL SZ 6.5 (GLOVE) ×2 IMPLANT
GLOVE BIO SURGEONS STRL SZ 6.5 (GLOVE) ×2
GLOVE BIOGEL PI IND STRL 6.5 (GLOVE) IMPLANT
GLOVE BIOGEL PI IND STRL 7.5 (GLOVE) IMPLANT
GLOVE BIOGEL PI INDICATOR 6.5 (GLOVE) ×6
GLOVE BIOGEL PI INDICATOR 7.5 (GLOVE) ×2
GLOVE ECLIPSE 7.0 STRL STRAW (GLOVE) ×2 IMPLANT
GLOVE SS BIOGEL STRL SZ 7.5 (GLOVE) ×1 IMPLANT
GLOVE SUPERSENSE BIOGEL SZ 7.5 (GLOVE) ×2
GOWN STRL REUS W/ TWL LRG LVL3 (GOWN DISPOSABLE) ×3 IMPLANT
GOWN STRL REUS W/TWL LRG LVL3 (GOWN DISPOSABLE) ×9
KIT BASIN OR (CUSTOM PROCEDURE TRAY) ×3 IMPLANT
KIT SHUNT ARGYLE CAROTID ART 6 (VASCULAR PRODUCTS) IMPLANT
KIT TURNOVER KIT B (KITS) ×3 IMPLANT
NEEDLE 22X1 1/2 (OR ONLY) (NEEDLE) IMPLANT
NS IRRIG 1000ML POUR BTL (IV SOLUTION) ×6 IMPLANT
PACK CAROTID (CUSTOM PROCEDURE TRAY) ×3 IMPLANT
PAD ARMBOARD 7.5X6 YLW CONV (MISCELLANEOUS) ×6 IMPLANT
PATCH HEMASHIELD 8X75 (Vascular Products) ×2 IMPLANT
SHUNT CAROTID BYPASS 10 (VASCULAR PRODUCTS) ×2 IMPLANT
SHUNT CAROTID BYPASS 12FRX15.5 (VASCULAR PRODUCTS) IMPLANT
SUT ETHILON 3 0 PS 1 (SUTURE) IMPLANT
SUT PROLENE 6 0 CC (SUTURE) ×3 IMPLANT
SUT SILK 3 0 (SUTURE)
SUT SILK 3-0 18XBRD TIE 12 (SUTURE) IMPLANT
SUT VIC AB 3-0 SH 27 (SUTURE) ×9
SUT VIC AB 3-0 SH 27X BRD (SUTURE) ×2 IMPLANT
SUT VICRYL 4-0 PS2 18IN ABS (SUTURE) ×3 IMPLANT
SYR CONTROL 10ML LL (SYRINGE) IMPLANT
TOWEL GREEN STERILE (TOWEL DISPOSABLE) ×3 IMPLANT
WATER STERILE IRR 1000ML POUR (IV SOLUTION) ×3 IMPLANT

## 2017-07-26 NOTE — Progress Notes (Signed)
Patient ID: [name], male   DOB: November 01, 1929, 84 y.o.   MRN: [id number]     Advanced Heart Failure Rounding Note  PCP-Cardiologist: No primary care provider on file.   Subjective:    Still some blurriness right eye but improved.  No dyspnea or chest pain.   Echo: EF 30-35%, mild AI, mild MR, moderate biatrial enlargement.   CT neck: 70% stenosis with ulcerated plaque RICA.  Non-opacified LAA, ?thrombus.    Objective:   Weight Range: 152 lb 14.4 oz (69.4 kg) Body mass index is 24.68 kg/m.   Vital Signs:   Temp:  [97.7 F (36.5 C)-98.4 F (36.9 C)] 97.7 F (36.5 C) (05/20 0742) Pulse Rate:  [69-74] 71 (05/20 0742) Resp:  [16-20] 20 (05/20 0742) BP: (105-117)/(65-84) 115/84 (05/20 0742) SpO2:  [96 %-98 %] 98 % (05/20 0742) Weight:  [152 lb 14.4 oz (69.4 kg)] 152 lb 14.4 oz (69.4 kg) (05/19 1100) Last BM Date: 07/26/17  Weight change: Filed Weights   07/25/17 1100  Weight: 152 lb 14.4 oz (69.4 kg)    Intake/Output:   Intake/Output Summary (Last 24 hours) at 07/26/2017 0909 Last data filed at 07/25/2017 2331 Gross per 24 hour  Intake 108.18 ml  Output -  Net 108.18 ml      Physical Exam    General:  Well appearing. No resp difficulty HEENT: Normal Neck: Supple. JVP not elevated. Carotids 2+ bilat; no bruits. No lymphadenopathy or thyromegaly appreciated. Cor: PMI nondisplaced. Regular rate & rhythm. No rubs, gallops or murmurs. Lungs: Clear Abdomen: Soft, nontender, nondistended. No hepatosplenomegaly. No bruits or masses. Good bowel sounds. Extremities: No cyanosis, clubbing, rash, edema Neuro: Alert & orientedx3, cranial nerves grossly intact. moves all 4 extremities w/o difficulty. Affect pleasant   Telemetry   Atrial fibrillation with BiV pacing 70 (personally reviewed)  Labs    CBC Recent Labs    07/23/17 1734 07/25/17 0533 07/26/17 0254  WBC 8.6 7.9 7.7  NEUTROABS 5.1  --   --   HGB 14.2 14.3 13.6  HCT 42.0 41.3 40.0  MCV 90.7 90.0  90.5  PLT 214 211 650   Basic Metabolic Panel Recent Labs    07/23/17 1734 07/25/17 0533  NA 139 138  K 4.2 3.9  CL 105 106  CO2 27 24  GLUCOSE 87 93  BUN 28* 22*  CREATININE 1.24 0.97  CALCIUM 9.6 9.0   Liver Function Tests Recent Labs    07/23/17 1734  AST 29  ALT 22  ALKPHOS 62  BILITOT 1.3*  PROT 6.8  ALBUMIN 4.2   No results for input(s): LIPASE, AMYLASE in the last 72 hours. Cardiac Enzymes No results for input(s): CKTOTAL, CKMB, CKMBINDEX, TROPONINI in the last 72 hours.  BNP: BNP (last 3 results) No results for input(s): BNP in the last 8760 hours.  ProBNP (last 3 results) No results for input(s): PROBNP in the last 8760 hours.   D-Dimer No results for input(s): DDIMER in the last 72 hours. Hemoglobin A1C Recent Labs    07/25/17 0533  HGBA1C 5.6   Fasting Lipid Panel Recent Labs    07/25/17 0533  CHOL 127  HDL 41  LDLCALC 72  TRIG 69  CHOLHDL 3.1   Thyroid Function Tests No results for input(s): TSH, T4TOTAL, T3FREE, THYROIDAB in the last 72 hours.  Invalid input(s): FREET3  Other results:   Imaging     No results found.   Medications:     Scheduled Medications: . atorvastatin  80 mg Oral q1800  . furosemide  80 mg Oral Daily  . losartan  12.5 mg Oral Daily  . metoprolol succinate  12.5 mg Oral Daily  . mupirocin ointment  1 application Nasal BID  . potassium chloride SA  20 mEq Oral BID  . predniSONE  1 mg Oral Q breakfast  . sodium chloride flush  3 mL Intravenous Q12H  . spironolactone  25 mg Oral Daily  . tamsulosin  0.4 mg Oral Daily     Infusions: . sodium chloride    .  ceFAZolin (ANCEF) IV Stopped (07/25/17 2331)  . heparin 1,100 Units/hr (07/25/17 2300)     PRN Medications:  sodium chloride, fluticasone, polyvinyl alcohol, sodium chloride flush    Assessment/Plan   1. Right central retinal artery occlusion: He has an ulcerated, 70% stenotic plaque in the right internal carotid.  I suspect this is  the source of embolism for CRAO.  However, there is question on CT of LAA thrombus (non-opacified appendage).  INR has been mostly therapeutic at home (in setting of chronic afib).   - He is currently on heparin gtt pre-CEA.  I would recommend that post-CEA, he switch to anticoagulation with Eliquis.  Therefore, I do not think there is a need for TEE as I would not do anything different if a LAA clot is definitively found.  - Plan for right CEA today with Dr. Donnetta Hutching.  Patient is stable and is of reasonable risk to undergo surgery today.  - Statin intensified to atorvastatin 80 daily.  2. Chronic systolic CHF: Echo this admission with EF 30-35%.  He has [employer] Jude CRT-P device s/p AV nodal ablation.  Probable nonischemic cardiomyopathy. Symptoms stable, active at home, not volume overloaded.  - Continue his home regimen: Lasix 80 daily, Toprol XL 12.5 daily, spironolactone 25 daily, losartan 12.5 daily.  3. Atrial fibrillation: Chronic.  He has had AV nodal ablation with BiV pacing.   - As above, will transition from warfarin to Eliquis after surgery.    Length of Stay: 2  [name], MD  07/26/2017, 9:09 AM  Advanced Heart Failure Team Pager [phone] (M-F; 7a - 4p)  Please contact [facility] for night-coverage after hours (4p -7a ) and weekends on [email or web address]

## 2017-07-26 NOTE — Anesthesia Postprocedure Evaluation (Signed)
Anesthesia Post Note  Patient: [name]  Procedure(s) Performed: RIGHT CAROTID  ARTERY ENDARTERECTOMY (Right Neck) WITH PATCH ANGIOPLASTY (Right Neck)     Patient location during evaluation: PACU Anesthesia Type: General Level of consciousness: awake and alert Pain management: pain level controlled Vital Signs Assessment: post-procedure vital signs reviewed and stable Respiratory status: spontaneous breathing, nonlabored ventilation, respiratory function stable and patient connected to nasal cannula oxygen Cardiovascular status: blood pressure returned to baseline and stable Postop Assessment: no apparent nausea or vomiting Anesthetic complications: no    Last Vitals:  Vitals:   07/26/17 1437 07/26/17 1651  BP: 100/61 125/68  Pulse: 73 73  Resp: (!) 21 12  Temp: 36.7 C (!) 36.1 C  SpO2: 94% 99%    Last Pain:  Vitals:   07/26/17 1700  TempSrc:   PainSc: 0-No pain                 Tiajuana Amass

## 2017-07-26 NOTE — Telephone Encounter (Signed)
-----   Message from Penni Homans, RN sent at 07/26/2017  1:06 PM EDT ----- Regarding: Apoointment   ----- Message ----- From: Ulyses Amor, PA-C Sent: 07/26/2017  12:37 PM To: Vvs Charge Pool  F/U with Dr. Donnetta Hutching in 2 weeks s/p right CEA( urgent 07/26/2017).  Not on PA schedule pls.

## 2017-07-26 NOTE — Progress Notes (Signed)
[place] for Heparin + warfarin Indication: atrial fibrillation  Allergies  Allergen Reactions  . Antihistamines, Diphenhydramine-Type Other (See Comments)    Causes difficulty in ability to urinate.    Patient Measurements: Height: 5\' 6"  (167.6 cm) Weight: 152 lb 14.4 oz (69.4 kg) IBW/kg (Calculated) : 63.8  Vital Signs: Temp: 97.7 F (36.5 C) (05/20 0742) Temp Source: Oral (05/20 0742) BP: 115/84 (05/20 0742) Pulse Rate: 71 (05/20 0742)  Labs: Recent Labs    07/23/17 1734 07/24/17 1146 07/25/17 0533 07/25/17 2046 07/26/17 0254  HGB 14.2  --  14.3  --  13.6  HCT 42.0  --  41.3  --  40.0  PLT 214  --  211  --  213  APTT 40*  --   --   --   --   LABPROT 25.5* 23.9* 21.3*  --  22.6*  INR 2.35 2.16 1.86  --  2.01  HEPARINUNFRC  --   --   --  0.30 0.25*  CREATININE 1.24  --  0.97  --   --     Estimated Creatinine Clearance: 49.3 mL/min (by C-G formula based on SCr of 0.97 mg/dL).  Assessment: 84 year old male on warfarin prior to admission for Afib. Warfarin currently on hold with plans for endarterectomy today for central retinal artery occlusion.  INR this morning is 2.01, heparin level dropped to 0.25 units/mL.  Patient is to have TEE today to evaluate possible LAA thrombus- if confirmed, INR goal will change to 2.5-3.5.  Warfarin dose prior to admission = 2.5 mg on Saturdays, 5 mg other days  Goal of Therapy:  Monitor platelets by anticoagulation protocol: Yes  Heparin level 0.3-0.7units/mL INR 2-3 (for now)   Plan:  Warfarin on hold Increase heparin to 1200 units / hr Daily heparin level, CBC, and INR  Honestie Kulik D. Zenna Traister, PharmD, BCPS Clinical Pharmacist [phone] 07/26/2017 8:51 AM

## 2017-07-26 NOTE — Progress Notes (Signed)
PROGRESS NOTE    [name]  OZD:[id number] DOB: 12-01-1929 DOA: 07/23/2017 PCP: [name], MD   Brief Narrative:   84 year old male with history of atrial fibrillation on Coumadin, coronary artery disease, pacemaker placement, polymyalgia rheumatica, nonischemic cardiomyopathy, hyperlipidemia, complete heart block status post pacemaker placement was sent to the hospital for management of central retinal artery occlusion.  Patient states he suddenly developed decreased vision in the right eye on 5/17 and was seen by ophthalmologist who diagnosed him with central retinal artery occlusion and was sent to the hospital.  In the hospital on the CT angiogram he was diagnosed with extremely irregular high-grade stenosis of at least 90% of the ICA.  Evaluated by neurology as well.  Vascular surgery recommended getting cardiac clearance prior to proceeding with management for the ICA stenosis.  Assessment & Plan:   Principal Problem:   Central retinal artery occlusion Active Problems:   Atrial fibrillation (HCC)   Polymyalgia rheumatica (HCC)   Thrombus of left atrial appendage without antecedent myocardial infarction   Stenosis of right internal carotid artery   DCM (dilated cardiomyopathy) (HCC)   Chronic systolic CHF (congestive heart failure), NYHA class 2 (HCC)   Preoperative clearance   Right-sided vision loss secondary to right central retinal artery occlusion due to high-grade stenosis/ulceration of ICA, stable -Currently Coumadin is on hold.  Continue heparin drip with plans to eventually transition him to Eliquis after the surgery. -Plans for CEA with vascular surgery today.  -Echocardiogram-shows ejection fraction 30-35%. No need for TEE.   History of atrial fibrillation s/p AV node ablation with BiV pacing.  -Patient is on Coumadin at home, currently on heparin due to surgery.  Eventually plan is to transition him to Eliquis.  History of complete heart block status  post pacemaker in place Ischemic cardiomyopathy, chronic systolic congestive heart failure, ejection fraction 30% -Continue current home medications.  No signs of volume overload.  Continue Toprol-XL 12.5 mg, Aldactone 25 mg and Lipitor 80 mg - Cardiology team has been consulted  Hyperlipidemia -Continue Lipitor 80 mg at bedtime  History of polymyalgia rheumatica -Continue his home dose of prednisone  BPH -Continue Flomax   DVT prophylaxis: Heparin drip Code Status: Full code Family Communication: None at bedside Disposition Plan: Maintain inpatient stay.  CEA today  Consultants:   Vascular surgery  Neurology-signed off  Cardiology  Procedures:   None  Antimicrobials:   None   Subjective: States his vision is still partially occluded on the right side but improved from the time of admission.  Review of Systems Otherwise negative except as per HPI, including: HEENT/EYES = negative for pain, redness, loss of vision, double vision, blurred vision, loss of hearing, sore throat, hoarseness, dysphagia Cardiovascular= negative for chest pain, palpitation, murmurs, lower extremity swelling Respiratory/lungs= negative for shortness of breath, cough, hemoptysis, wheezing, mucus production Gastrointestinal= negative for nausea, vomiting,, abdominal pain, melena, hematemesis Genitourinary= negative for Dysuria, Hematuria, Change in Urinary Frequency MSK = Negative for arthralgia, myalgias, Back Pain, Joint swelling  Neurology= Negative for headache, seizures, numbness, tingling  Psychiatry= Negative for anxiety, depression, suicidal and homocidal ideation Allergy/Immunology= Medication/Food allergy as listed  Skin= Negative for Rash, lesions, ulcers, itching   Objective: Vitals:   07/26/17 0000 07/26/17 0405 07/26/17 0742 07/26/17 1035  BP: 105/70 110/65 115/84 121/72  Pulse: 73 69 71 70  Resp:  18 20   Temp: 97.8 F (36.6 C) 97.9 F (36.6 C) 97.7 F (36.5 C)     TempSrc: Oral Oral  Oral   SpO2: 96% 98% 98%   Weight:      Height:        Intake/Output Summary (Last 24 hours) at 07/26/2017 1125 Last data filed at 07/25/2017 2331 Gross per 24 hour  Intake 108.18 ml  Output -  Net 108.18 ml   Filed Weights   07/25/17 1100  Weight: 69.4 kg (152 lb 14.4 oz)    Examination:  Constitutional: NAD, calm, comfortable Eyes: PERRL, lids and conjunctivae normal ENMT: Mucous membranes are moist. Posterior pharynx clear of any exudate or lesions.Normal dentition.  Right carotid bruit Neck: normal, supple, no masses, no thyromegaly Respiratory: clear to auscultation bilaterally, no wheezing, no crackles. Normal respiratory effort. No accessory muscle use.  Cardiovascular: Regular rate and rhythm, no murmurs / rubs / gallops. No extremity edema. 2+ pedal pulses. No carotid bruits.  Abdomen: no tenderness, no masses palpated. No hepatosplenomegaly. Bowel sounds positive.  Musculoskeletal: no clubbing / cyanosis. No joint deformity upper and lower extremities. Good ROM, no contractures. Normal muscle tone.  Skin: no rashes, lesions, ulcers. No induration Neurologic: CN 2-12 grossly intact. Sensation intact, DTR normal. Strength 5/5 in all 4.  Psychiatric: Normal judgment and insight. Alert and oriented x 3. Normal mood.     Data Reviewed:   CBC: Recent Labs  Lab 07/23/17 1734 07/25/17 0533 07/26/17 0254  WBC 8.6 7.9 7.7  NEUTROABS 5.1  --   --   HGB 14.2 14.3 13.6  HCT 42.0 41.3 40.0  MCV 90.7 90.0 90.5  PLT 214 211 578   Basic Metabolic Panel: Recent Labs  Lab 07/23/17 1734 07/25/17 0533  NA 139 138  K 4.2 3.9  CL 105 106  CO2 27 24  GLUCOSE 87 93  BUN 28* 22*  CREATININE 1.24 0.97  CALCIUM 9.6 9.0   GFR: Estimated Creatinine Clearance: 49.3 mL/min (by C-G formula based on SCr of 0.97 mg/dL). Liver Function Tests: Recent Labs  Lab 07/23/17 1734  AST 29  ALT 22  ALKPHOS 62  BILITOT 1.3*  PROT 6.8  ALBUMIN 4.2   No  results for input(s): LIPASE, AMYLASE in the last 168 hours. No results for input(s): AMMONIA in the last 168 hours. Coagulation Profile: Recent Labs  Lab 07/23/17 1734 07/24/17 1146 07/25/17 0533 07/26/17 0254  INR 2.35 2.16 1.86 2.01   Cardiac Enzymes: No results for input(s): CKTOTAL, CKMB, CKMBINDEX, TROPONINI in the last 168 hours. BNP (last 3 results) No results for input(s): PROBNP in the last 8760 hours. HbA1C: Recent Labs    07/24/17 0726 07/25/17 0533  HGBA1C 5.6 5.6   CBG: No results for input(s): GLUCAP in the last 168 hours. Lipid Profile: Recent Labs    07/24/17 0726 07/25/17 0533  CHOL 130 127  HDL 45 41  LDLCALC 76 72  TRIG 47 69  CHOLHDL 2.9 3.1   Thyroid Function Tests: No results for input(s): TSH, T4TOTAL, FREET4, T3FREE, THYROIDAB in the last 72 hours. Anemia Panel: No results for input(s): VITAMINB12, FOLATE, FERRITIN, TIBC, IRON, RETICCTPCT in the last 72 hours. Sepsis Labs: No results for input(s): PROCALCITON, LATICACIDVEN in the last 168 hours.  Recent Results (from the past 240 hour(s))  Surgical PCR screen     Status: None   Collection Time: 07/25/17  9:08 PM  Result Value Ref Range Status   MRSA, PCR NEGATIVE NEGATIVE Final   Staphylococcus aureus NEGATIVE NEGATIVE Final    Comment: (NOTE) The Xpert SA Assay (FDA approved for NASAL specimens in patients 22 years  of age and older), is one component of a comprehensive surveillance program. It is not intended to diagnose infection nor to guide or monitor treatment. Performed at [facility], [place] [address], [place],  [address]          Radiology Studies: No results found.      Scheduled Meds: . [MAR Hold] atorvastatin  80 mg Oral q1800  . [MAR Hold] furosemide  80 mg Oral Daily  . [MAR Hold] losartan  12.5 mg Oral Daily  . [MAR Hold] metoprolol succinate  12.5 mg Oral Daily  . metoprolol succinate  12.5 mg Oral Daily  . [MAR Hold] mupirocin ointment  1  application Nasal BID  . [MAR Hold] potassium chloride SA  20 mEq Oral BID  . [MAR Hold] predniSONE  1 mg Oral Q breakfast  . [MAR Hold] sodium chloride flush  3 mL Intravenous Q12H  . [MAR Hold] spironolactone  25 mg Oral Daily  . [MAR Hold] tamsulosin  0.4 mg Oral Daily   Continuous Infusions: . [MAR Hold] sodium chloride    . heparin 1,100 Units/hr (07/25/17 2300)     LOS: 2 days    I have spent 25 minutes face to face with the patient and on the ward discussing the patients care, assessment, plan and disposition with other care givers. >50% of the time was devoted counseling the patient about the risks and benefits of treatment and coordinating care.     Rosealyn Little Arsenio Loader, MD [facility] Pager [phone]   If 7PM-7AM, please contact night-coverage [email or web address] Password [facility] 07/26/2017, 11:25 AM

## 2017-07-26 NOTE — Progress Notes (Signed)
    S/P right CEA symptomatic ICA stenosis Right retinal artery occlusion  Right neck incision without hematoma, soft No tongue deviation with symmetrical smile Disposition stable  Plan discharge possibly tomorrow pending recommendation from cardiology Hold Heparin until tomorrow and then plan on starting Eliquis.  Roxy Horseman PA-C

## 2017-07-26 NOTE — Anesthesia Procedure Notes (Signed)
Arterial Line Insertion Start/End5/20/2019 10:10 AM, 07/26/2017 10:30 AM Performed by: Lance Coon, CRNA, CRNA  Patient location: Pre-op. Preanesthetic checklist: patient identified, IV checked, site marked, risks and benefits discussed, surgical consent, monitors and equipment checked, pre-op evaluation, timeout performed and anesthesia consent Lidocaine 1% used for infiltration Right, radial was placed Catheter size: 20 G Hand hygiene performed , maximum sterile barriers used  and Seldinger technique used  Attempts: 3 Procedure performed without using ultrasound guided technique. Following insertion, dressing applied and Biopatch. Post procedure assessment: normal  Patient tolerated the procedure well with no immediate complications.

## 2017-07-26 NOTE — Anesthesia Preprocedure Evaluation (Addendum)
Anesthesia Evaluation  Patient identified by MRN, date of birth, ID band Patient awake    Reviewed: Allergy & Precautions, NPO status , Patient's Chart, lab work & pertinent test results  Airway Mallampati: II  TM Distance: >3 FB Neck ROM: Full    Dental  (+) Dental Advisory Given   Pulmonary former smoker,    breath sounds clear to auscultation       Cardiovascular hypertension, Pt. on medications and Pt. on home beta blockers + CAD, + Peripheral Vascular Disease and +CHF  + dysrhythmias + pacemaker  Rhythm:Regular Rate:Normal  07/25/17 Echo: Technically difficult; EF difficult to quantitate; definity used;  moderate to severe global reduction in LV systolic function; mild  LVE; mild AI; mildly dilated aortic root; mild MR; moderate  biatrial enlargement; mild TR.   Neuro/Psych negative neurological ROS     GI/Hepatic Neg liver ROS, GERD  ,  Endo/Other  negative endocrine ROS  Renal/GU negative Renal ROS     Musculoskeletal  (+) Arthritis ,   Abdominal   Peds  Hematology negative hematology ROS (+)   Anesthesia Other Findings   Reproductive/Obstetrics                            Lab Results  Component Value Date   WBC 7.7 07/26/2017   HGB 13.6 07/26/2017   HCT 40.0 07/26/2017   MCV 90.5 07/26/2017   PLT 213 07/26/2017   Lab Results  Component Value Date   CREATININE 0.97 07/25/2017   BUN 22 (H) 07/25/2017   NA 138 07/25/2017   K 3.9 07/25/2017   CL 106 07/25/2017   CO2 24 07/25/2017    Anesthesia Physical Anesthesia Plan  ASA: III  Anesthesia Plan: General   Post-op Pain Management:    Induction: Intravenous  PONV Risk Score and Plan: 2 and Ondansetron, Dexamethasone and Treatment may vary due to age or medical condition  Airway Management Planned: Oral ETT  Additional Equipment: Arterial line  Intra-op Plan:   Post-operative Plan: Extubation in OR and Possible  Post-op intubation/ventilation  Informed Consent: I have reviewed the patients History and Physical, chart, labs and discussed the procedure including the risks, benefits and alternatives for the proposed anesthesia with the patient or authorized representative who has indicated his/her understanding and acceptance.   Dental advisory given  Plan Discussed with: CRNA  Anesthesia Plan Comments:         Anesthesia Quick Evaluation

## 2017-07-26 NOTE — Op Note (Signed)
    OPERATIVE REPORT  DATE OF SURGERY: 07/26/2017  PATIENT: [name], 84 y.o. male MRN: [id number]  DOB: [birth year]  PRE-OPERATIVE DIAGNOSIS: Symptomatic right internal carotid artery stenosis with retinal artery occlusion  POST-OPERATIVE DIAGNOSIS:  Same  PROCEDURE: Right carotid endarterectomy and Dacron patch angioplasty  SURGEON:  Curt Jews, M.D.  PHYSICIAN ASSISTANT: Laurence Slate, PA-C  ANESTHESIA: General  EBL: 50 ml  Total I/O In: -  Out: 50 [Blood:50]  BLOOD ADMINISTERED: None  DRAINS: None  SPECIMEN: None  COUNTS CORRECT:  YES  PLAN OF CARE: PACU neurologically stable  PATIENT DISPOSITION:  PACU - hemodynamically stable  PROCEDURE DETAILS: The patient was taken to the operating placed supine position where the area the right next prepped and draped you sterile fashion.  Incision was made anterior to the sternocleidomastoid and carried down through the platysma with electrocautery.  The sternocleidomastoid reflected posteriorly and the carotid sheath was opened.  Facial vein was ligated with 2-0 silk ties and divided.  Dissection was continued up onto the bifurcation and the hypoglossal nerve and vagus nerves were identified and preserved.  The common carotid artery was encircled with an umbilical tape and Rummel tourniquet.  The superior thyroid artery was encircled with a 2-0 silk Potts tie.  The external carotid was encircled with a blue vessel loop and the internal carotid was encircled with an umbilical tape and Rummel tourniquet.  The patient was given 7000 units of intravenous heparin after adequate circulation time the internal/external and common carotid arteries were occluded.  The common carotid artery was opened with an 11 blade and extended longitudinally with Potts scissors into the internal carotid artery.  Patient had a high-grade stenosis with extremely irregular plaque.  The shunt was passed up the normal internal carotid above the narrowing  and then was secured in the common carotid artery with a Rummel tourniquet.  Endarterectomy was again on the common carotid artery and the plaque was divided proximally with Potts scissors.  The endarterectomy scanted onto the external carotid with eversion technique and the internal carotid with an open fashion.  Remaining atheromatous debris was removed from the endarterectomy plane.  A Finesse Hemashield Dacron patch was brought onto the patch angioplasty with a running 6-0 Prolene suture.  Prior to completion of the closure the usual flushing maneuvers were undertaken.  The anastomosis was completed and flow was restored to the external and internal carotid arteries.  Excellent flow characteristics were noted with hand-held Doppler on the internal and external carotid arteries.  The patient was given 50 mg of protamine to reverse the heparin.  Wounds irrigated with saline and hemostasis obtained with cautery.  The wounds were closed with 3-0 Vicryl to reapproximate the sternocleidomastoid over the carotid sheath.  Next the platysma was closed with a running 3-0 Vicryl suture and finally the skin was closed with a 4-0 subcuticular Vicryl stitch.  Sterile dressing was applied the patient was awakened neurologically intact in the operating room and was transferred to the recovery room in stable condition   Rosetta Posner, M.D., [facility] 07/26/2017 2:16 PM

## 2017-07-26 NOTE — Interval H&P Note (Signed)
History and Physical Interval Note:  07/26/2017 10:13 AM  [name]  has presented today for surgery, with the diagnosis of righ ICA stenosis  The various methods of treatment have been discussed with the patient and family. After consideration of risks, benefits and other options for treatment, the patient has consented to  Procedure(s): ENDARTERECTOMY CAROTID (Right) as a surgical intervention .  The patient's history has been reviewed, patient examined, no change in status, stable for surgery.  I have reviewed the patient's chart and labs.  Questions were answered to the patient's satisfaction.     Curt Jews

## 2017-07-26 NOTE — Anesthesia Procedure Notes (Signed)
Procedure Name: Intubation Date/Time: 07/26/2017 10:54 AM Performed by: Lance Coon, CRNA Pre-anesthesia Checklist: Patient identified, Emergency Drugs available, Suction available, Patient being monitored and Timeout performed Patient Re-evaluated:Patient Re-evaluated prior to induction Oxygen Delivery Method: Circle system utilized Preoxygenation: Pre-oxygenation with 100% oxygen Induction Type: IV induction Ventilation: Mask ventilation without difficulty Laryngoscope Size: Miller and 3 Grade View: Grade II Tube type: Oral Tube size: 7.5 mm Number of attempts: 1 Airway Equipment and Method: Stylet Placement Confirmation: ETT inserted through vocal cords under direct vision,  positive ETCO2 and breath sounds checked- equal and bilateral Secured at: 22 cm Tube secured with: Tape Dental Injury: Teeth and Oropharynx as per pre-operative assessment

## 2017-07-26 NOTE — Telephone Encounter (Signed)
sch appt 08/17/17 915 am p/o MD s/p R CEA per stf msg

## 2017-07-26 NOTE — Transfer of Care (Signed)
Immediate Anesthesia Transfer of Care Note  Patient: [name]  Procedure(s) Performed: RIGHT CAROTID  ARTERY ENDARTERECTOMY (Right Neck) WITH PATCH ANGIOPLASTY (Right Neck)  Patient Location: PACU  Anesthesia Type:General  Level of Consciousness: awake and patient cooperative  Airway & Oxygen Therapy: Patient Spontanous Breathing and Patient connected to face mask oxygen  Post-op Assessment: Report given to RN and Post -op Vital signs reviewed and stable  Post vital signs: Reviewed and stable  Last Vitals:  Vitals Value Taken Time  BP 128/77 07/26/2017 12:50 PM  Temp    Pulse 86 07/26/2017 12:52 PM  Resp 32 07/26/2017 12:52 PM  SpO2 96 % 07/26/2017 12:52 PM  Vitals shown include unvalidated device data.  Last Pain:  Vitals:   07/26/17 0742  TempSrc: Oral  PainSc:          Complications: No apparent anesthesia complications

## 2017-07-27 ENCOUNTER — Encounter (HOSPITAL_COMMUNITY): Payer: Self-pay | Admitting: Vascular Surgery

## 2017-07-27 LAB — BASIC METABOLIC PANEL
Anion gap: 6 (ref 5–15)
BUN: 15 mg/dL (ref 6–20)
CO2: 25 mmol/L (ref 22–32)
CREATININE: 0.84 mg/dL (ref 0.61–1.24)
Calcium: 8.9 mg/dL (ref 8.9–10.3)
Chloride: 107 mmol/L (ref 101–111)
GFR calc Af Amer: >60 mL/min (ref >60)
GLUCOSE: 169 mg/dL — AB (ref 65–99)
Potassium: 4.5 mmol/L (ref 3.5–5.1)
SODIUM: 138 mmol/L (ref 135–145)

## 2017-07-27 LAB — CBC
HCT: 39.9 % (ref 39.0–52.0)
Hemoglobin: 13.6 g/dL (ref 13.0–17.0)
MCH: 30.5 pg (ref 26.0–34.0)
MCHC: 34.1 g/dL (ref 30.0–36.0)
MCV: 89.5 fL (ref 78.0–100.0)
PLATELETS: 191 10*3/uL (ref 150–400)
RBC: 4.46 MIL/uL (ref 4.22–5.81)
RDW: 13.5 % (ref 11.5–15.5)
WBC: 11.1 10*3/uL — ABNORMAL HIGH (ref 4.0–10.5)

## 2017-07-27 LAB — PROTIME-INR
INR: 1.4
PROTHROMBIN TIME: 17.1 s — AB (ref 11.4–15.2)

## 2017-07-27 MED ORDER — ASPIRIN EC 81 MG PO TBEC: 81.0000 mg | DELAYED_RELEASE_TABLET | Freq: Every day | ORAL | 2 refills | Status: DC

## 2017-07-27 MED ORDER — APIXABAN 5 MG PO TABS
5.0000 mg | ORAL_TABLET | Freq: Two times a day (BID) | ORAL | Status: DC
  Administered 2017-07-27: 5 mg via ORAL
  Filled 2017-07-27: qty 1

## 2017-07-27 MED ORDER — APIXABAN 5 MG PO TABS: 5.0000 mg | ORAL_TABLET | Freq: Two times a day (BID) | ORAL | 0 refills | Status: DC

## 2017-07-27 MED ORDER — OXYCODONE HCL 5 MG PO TABS: 5.0000 mg | ORAL_TABLET | Freq: Four times a day (QID) | ORAL | 0 refills | Status: DC | PRN

## 2017-07-27 NOTE — Discharge Summary (Signed)
Physician Discharge Summary  KORDE JEPPSEN JSH:[id number] DOB: Sep 13, 1929 DOA: 07/23/2017  PCP: [name], MD  Admit date: 07/23/2017 Discharge date: 07/27/2017  Admitted From: Home Disposition:  Home  Recommendations for Outpatient Follow-up:  1. Follow up with PCP in 1-2 weeks 2. Please obtain BMP/CBC in one week your next doctors visit.  3. CHF team to arrange for follow-up 4. Coumadin has been stopped, started Eliquis 5 mg twice daily 5. Follow-up with Dr Donnetta Hutching in "Several weeks"   Home Health: Home Equipment/Devices: None Discharge Condition: Stable CODE STATUS: Full code Diet recommendation: Cardiac diet  Brief/Interim Summary:   84 year old male with history of atrial fibrillation on Coumadin, coronary artery disease, pacemaker placement, polymyalgia rheumatica, nonischemic cardiomyopathy, hyperlipidemia, complete heart block status post pacemaker placement was sent to the hospital for management of central retinal artery occlusion.  Patient states he suddenly developed decreased vision in the right eye on 5/17 and was seen by ophthalmologist who diagnosed him with central retinal artery occlusion and was sent to the hospital.  In the hospital on the CT angiogram he was diagnosed with extremely irregular high-grade stenosis of at least 90% of the ICA.  Evaluated by neurology as well.  Vascular surgery recommended getting cardiac clearance prior to proceeding with management for the ICA stenosis. Patient had an echocardiogram which showed ejection fraction 30-35%, mild AI, mild to moderate regurgitation and moderate bilateral atrial enlargement.  Coumadin was stopped and patient was taken for right carotid endarterectomy and angioplasty on 5/20.  Patient tolerated the procedure well and the following day he stated he felt much better with improvement in the right side vision.  Still had a small blurry spot but much better. With recommendations from cardiology team, his  Coumadin was stopped and was switched to Eliquis 5 mg twice daily.  Today patient has reached maximum benefit from an hospital stay and stable to be discharged with above recommendations as noted.   Discharge Diagnoses:  Principal Problem:   Central retinal artery occlusion Active Problems:   Atrial fibrillation (HCC)   Polymyalgia rheumatica (HCC)   Thrombus of left atrial appendage without antecedent myocardial infarction   Stenosis of right internal carotid artery   DCM (dilated cardiomyopathy) (HCC)   Chronic systolic CHF (congestive heart failure), NYHA class 2 (HCC)   Preoperative clearance   Right-sided vision loss secondary to right central retinal artery occlusion due to high-grade stenosis/ulceration of ICA, stable and vision is greatly improved -Coumadin has been stopped, will be switched to Eliquis -Tolerated CEA and angioplasty well yesterday.  Seen by vascular surgeon again this morning who cleared the patient to be discharged with outpatient follow-up in several weeks -Echocardiogram-shows ejection fraction 30-35%. No need for TEE.   History of atrial fibrillation s/p AV node ablation with BiV pacing.  -Per cardiology patient has been switched to Eliquis.  30-day Eliquis free supply card given.  History of complete heart block status post pacemaker in place Ischemic cardiomyopathy, chronic systolic congestive heart failure, ejection fraction 30% -Continue current home medications.  No signs of volume overload.  Continue Toprol-XL 12.5 mg, Aldactone 25 mg and Lipitor 80 mg -  Outpatient follow-up with cardiology team is to be arranged by cardiology.  Hyperlipidemia -Continue Lipitor 80 mg at bedtime  History of polymyalgia rheumatica -Continue his home dose of prednisone  BPH -Continue Flomax  Eliquis   Discharge Instructions   Allergies as of 07/27/2017      Reactions   Antihistamines, Diphenhydramine-type Other (See Comments)  Causes difficulty  in ability to urinate.      Medication List    TAKE these medications   acetaminophen 500 MG tablet Commonly known as:  TYLENOL Take 1,000 mg by mouth every 6 (six) hours as needed for moderate pain or headache.   apixaban 5 MG Tabs tablet Commonly known as:  ELIQUIS Take 1 tablet (5 mg total) by mouth 2 (two) times daily.   aspirin EC 81 MG tablet Take 1 tablet (81 mg total) by mouth daily.   fluticasone 50 MCG/ACT nasal spray Commonly known as:  FLONASE Place 2 sprays into both nostrils daily as needed for allergies or rhinitis.   furosemide 80 MG tablet Commonly known as:  LASIX Take 1 tablet (80 mg total) by mouth 2 (two) times daily. Please call for office visit [phone] What changed:    when to take this  additional instructions   hydroxypropyl methylcellulose / hypromellose 2.5 % ophthalmic solution Commonly known as:  ISOPTO TEARS / GONIOVISC Place 1 drop into both eyes daily as needed for dry eyes.   KLOR-CON M20 20 MEQ tablet Generic drug:  potassium chloride SA TAKE 1 TABLET TWICE DAILY   losartan 25 MG tablet Commonly known as:  COZAAR Take 1 tablet (25 mg total) by mouth daily. Please call for office visit [phone]   metoprolol succinate 25 MG 24 hr tablet Commonly known as:  TOPROL-XL Take 0.5 tablets (12.5 mg total) by mouth daily. Please call for office visit [phone]   oxyCODONE 5 MG immediate release tablet Commonly known as:  Oxy IR/ROXICODONE Take 1 tablet (5 mg total) by mouth every 6 (six) hours as needed for moderate pain.   pravastatin 80 MG tablet Commonly known as:  PRAVACHOL TAKE 1/2 TABLET (40 MG TOTAL) DAILY   predniSONE 1 MG tablet Commonly known as:  DELTASONE Take 1-2 tablets (1-2 mg total) by mouth daily with breakfast. What changed:  how much to take   spironolactone 25 MG tablet Commonly known as:  ALDACTONE Take 1 tablet (25 mg total) by mouth daily. Please call for office visit [phone]   tamsulosin  0.4 MG Caps capsule Commonly known as:  FLOMAX TAKE 1 CAPSULE EVERY DAY   warfarin 5 MG tablet Commonly known as:  COUMADIN Take as directed. If you are unsure how to take this medication, talk to your nurse or doctor. Original instructions:  Take 2.5-5 mg by mouth See admin instructions. Take 1/2 tablet on Saturday then take 1 tablet all the other days   warfarin 5 MG tablet Commonly known as:  COUMADIN Take as directed. If you are unsure how to take this medication, talk to your nurse or doctor. Original instructions:  TAKE 1/2 TO 1 TABLET (2.5 TO 5 MG TOTAL) DAILY AS DIRECTED BY ANTICOAGULATION CLINIC      Follow-up Information    [facility]. Schedule an appointment as soon as possible for a visit in 4 week(s).   Specialty:  Radiology Contact information: [address] [place] [phone]       Rosetta Posner, MD In 2 weeks.   Specialties:  Vascular Surgery, Cardiology Why:  Office will call you to arrange your appt (sent) Contact information: [place] [address] [phone]        [name], MD. Schedule an appointment as soon as possible for a visit in 1 week(s).   Specialty:  Family Medicine Contact information: [address]  [place] [address] [phone]          Allergies  Allergen Reactions  . Antihistamines, Diphenhydramine-Type Other (See Comments)    Causes difficulty in ability to urinate.    You were cared for by a hospitalist during your hospital stay. If you have any questions about your discharge medications or the care you received while you were in the hospital after you are discharged, you can call the unit and asked to speak with the hospitalist on call if the hospitalist that took care of you is not available. Once you are discharged, your primary care physician will handle any further medical issues. Please note that no refills for any  discharge medications will be authorized once you are discharged, as it is imperative that you return to your primary care physician (or establish a relationship with a primary care physician if you do not have one) for your aftercare needs so that they can reassess your need for medications and monitor your lab values.  Consultations:  Vascular surgery  Neurology  Cardiology   Procedures/Studies: Ct Angio Head W Or Wo Contrast  Result Date: 07/24/2017 CLINICAL DATA:  Retinal vascular occlusion. Vision loss on the right. EXAM: CT ANGIOGRAPHY HEAD AND NECK TECHNIQUE: Multidetector CT imaging of the head and neck was performed using the standard protocol during bolus administration of intravenous contrast. Multiplanar CT image reconstructions and MIPs were obtained to evaluate the vascular anatomy. Carotid stenosis measurements (when applicable) are obtained utilizing NASCET criteria, using the distal internal carotid diameter as the denominator. CONTRAST:  2mL ISOVUE-370 IOPAMIDOL (ISOVUE-370) INJECTION 76% COMPARISON:  Head CT from yesterday FINDINGS: CTA NECK FINDINGS Aortic arch: Three vessel branching with mild atherosclerotic plaque. Right carotid system: Prominent mainly low-density atherosclerotic plaque and/or thrombus at the ICA bulb with at least 70% stenosis. There is positive remodeling at the level of plaque. On coronal reformats a discrete ulceration is noted. Left carotid system: Moderate mainly calcified plaque at the ICA bulb primarily, with no flow limiting stenosis or ulceration. Stenosis measures 40% at the ICA bulb. Vertebral arteries: No proximal subclavian flow limiting stenosis. The left vertebral artery is dominant. Both vertebral arteries are smooth and diffusely patent to the dura Skeleton: No acute or aggressive finding. Other neck: No significant incidental finding. Upper chest: Airway thickening and left hilar calcifications. Dual-chamber pacer lead.Non opacified left  atrial appendage despite good opacification of the visualized left heart and aorta. Coronary atherosclerosis. Review of the MIP images confirms the above findings CTA HEAD FINDINGS Anterior circulation: Atherosclerotic plaque on the carotid siphons without flow limiting stenosis. Hypoplastic right A1 segment. A broad anterior communicating artery is present. Tiny outpouching from the left supraclinoid ICA, likely infundibulum based on shape. Posterior circulation: Left vertebral artery dominance. Left V4 segment calcified plaque. No flow limiting stenosis or branch occlusion. Venous sinuses: Patent Anatomic variants: As above Delayed phase: No abnormal intracranial enhancement Review of the MIP images confirms the above findings IMPRESSION: 1. High-grade (at least 70%) atheromatous stenosis at the right ICA bulb. The plaque is mainly low-density and is ulcerated, a likely embolic source. 2. Non-opacified left atrial appendage, probable clot. 3. 40% narrowing at the left ICA bulb. 4. No intracranial branch occlusion or high-grade stenosis. Electronically Signed   By: Monte Fantasia M.D.   On: 07/24/2017 09:03   Ct Head Wo Contrast  Result Date: 07/23/2017 CLINICAL DATA:  Visual changes and RIGHT eye starting this morning. EXAM: CT HEAD WITHOUT CONTRAST TECHNIQUE: Contiguous axial images were obtained  from the base of the skull through the vertex without intravenous contrast. COMPARISON:  None. FINDINGS: Brain: Mild generalized age related parenchymal atrophy with commensurate dilatation of the ventricles and sulci. Small old lacunar infarct within the RIGHT basal ganglia. There is no mass, hemorrhage, edema or other evidence of acute parenchymal abnormality. No extra-axial hemorrhage. Vascular: There are chronic calcified atherosclerotic changes of the large vessels at the skull base. No unexpected hyperdense vessel. Skull: Normal. Negative for fracture or focal lesion. Sinuses/Orbits: Periorbital and  retro-orbital soft tissues are unremarkable. Orbital globes appear symmetric in position and configuration. Visualized upper paranasal sinuses are clear. Other: None. IMPRESSION: No acute findings.  No intracranial mass, hemorrhage or edema. Electronically Signed   By: Franki Cabot M.D.   On: 07/23/2017 18:18   Ct Angio Neck W And/or Wo Contrast  Result Date: 07/24/2017 CLINICAL DATA:  Retinal vascular occlusion. Vision loss on the right. EXAM: CT ANGIOGRAPHY HEAD AND NECK TECHNIQUE: Multidetector CT imaging of the head and neck was performed using the standard protocol during bolus administration of intravenous contrast. Multiplanar CT image reconstructions and MIPs were obtained to evaluate the vascular anatomy. Carotid stenosis measurements (when applicable) are obtained utilizing NASCET criteria, using the distal internal carotid diameter as the denominator. CONTRAST:  13mL ISOVUE-370 IOPAMIDOL (ISOVUE-370) INJECTION 76% COMPARISON:  Head CT from yesterday FINDINGS: CTA NECK FINDINGS Aortic arch: Three vessel branching with mild atherosclerotic plaque. Right carotid system: Prominent mainly low-density atherosclerotic plaque and/or thrombus at the ICA bulb with at least 70% stenosis. There is positive remodeling at the level of plaque. On coronal reformats a discrete ulceration is noted. Left carotid system: Moderate mainly calcified plaque at the ICA bulb primarily, with no flow limiting stenosis or ulceration. Stenosis measures 40% at the ICA bulb. Vertebral arteries: No proximal subclavian flow limiting stenosis. The left vertebral artery is dominant. Both vertebral arteries are smooth and diffusely patent to the dura Skeleton: No acute or aggressive finding. Other neck: No significant incidental finding. Upper chest: Airway thickening and left hilar calcifications. Dual-chamber pacer lead.Non opacified left atrial appendage despite good opacification of the visualized left heart and aorta. Coronary  atherosclerosis. Review of the MIP images confirms the above findings CTA HEAD FINDINGS Anterior circulation: Atherosclerotic plaque on the carotid siphons without flow limiting stenosis. Hypoplastic right A1 segment. A broad anterior communicating artery is present. Tiny outpouching from the left supraclinoid ICA, likely infundibulum based on shape. Posterior circulation: Left vertebral artery dominance. Left V4 segment calcified plaque. No flow limiting stenosis or branch occlusion. Venous sinuses: Patent Anatomic variants: As above Delayed phase: No abnormal intracranial enhancement Review of the MIP images confirms the above findings IMPRESSION: 1. High-grade (at least 70%) atheromatous stenosis at the right ICA bulb. The plaque is mainly low-density and is ulcerated, a likely embolic source. 2. Non-opacified left atrial appendage, probable clot. 3. 40% narrowing at the left ICA bulb. 4. No intracranial branch occlusion or high-grade stenosis. Electronically Signed   By: Monte Fantasia M.D.   On: 07/24/2017 09:03      Subjective: No complaints besides slight blurry vision from his right eye but greatly improved.  HEENT/EYES = negative for pain, redness, loss of vision, double vision,  loss of hearing, sore throat, hoarseness, dysphagia Cardiovascular= negative for chest pain, palpitation, murmurs, lower extremity swelling Respiratory/lungs= negative for shortness of breath, cough, hemoptysis, wheezing, mucus production Gastrointestinal= negative for nausea, vomiting,, abdominal pain, melena, hematemesis Genitourinary= negative for Dysuria, Hematuria, Change in Urinary Frequency MSK = Negative  for arthralgia, myalgias, Back Pain, Joint swelling  Neurology= Negative for headache, seizures, numbness, tingling  Psychiatry= Negative for anxiety, depression, suicidal and homocidal ideation Allergy/Immunology= Medication/Food allergy as listed  Skin= Negative for Rash, lesions, ulcers,  itching   Discharge Exam: Vitals:   07/27/17 0330 07/27/17 0814  BP:  123/73  Pulse:  74  Resp:  (!) 23  Temp: 97.9 F (36.6 C) 97.8 F (36.6 C)  SpO2:     Vitals:   07/26/17 2025 07/26/17 2346 07/27/17 0330 07/27/17 0814  BP:  110/63  123/73  Pulse:    74  Resp:    (!) 23  Temp: 98.2 F (36.8 C) 98.3 F (36.8 C) 97.9 F (36.6 C) 97.8 F (36.6 C)  TempSrc: Oral  Oral Oral  SpO2:      Weight:      Height:        General: Pt is alert, awake, not in acute distress; right neck see a scar noted without any signs of active bleeding. Cardiovascular: RRR, S1/S2 +, no rubs, no gallops Respiratory: CTA bilaterally, no wheezing, no rhonchi Abdominal: Soft, NT, ND, bowel sounds + Extremities: no edema, no cyanosis    The results of significant diagnostics from this hospitalization (including imaging, microbiology, ancillary and laboratory) are listed below for reference.     Microbiology: Recent Results (from the past 240 hour(s))  Surgical PCR screen     Status: None   Collection Time: 07/25/17  9:08 PM  Result Value Ref Range Status   MRSA, PCR NEGATIVE NEGATIVE Final   Staphylococcus aureus NEGATIVE NEGATIVE Final    Comment: (NOTE) The Xpert SA Assay (FDA approved for NASAL specimens in patients 3 years of age and older), is one component of a comprehensive surveillance program. It is not intended to diagnose infection nor to guide or monitor treatment. Performed at [facility], [place] [address], [place], [place] [address]      Labs: BNP (last 3 results) No results for input(s): BNP in the last 8760 hours. Basic Metabolic Panel: Recent Labs  Lab 07/23/17 1734 07/25/17 0533 07/27/17 0436  NA 139 138 138  K 4.2 3.9 4.5  CL 105 106 107  CO2 27 24 25   GLUCOSE 87 93 169*  BUN 28* 22* 15  CREATININE 1.24 0.97 0.84  CALCIUM 9.6 9.0 8.9   Liver Function Tests: Recent Labs  Lab 07/23/17 1734  AST 29  ALT 22  ALKPHOS 62  BILITOT 1.3*  PROT 6.8   ALBUMIN 4.2   No results for input(s): LIPASE, AMYLASE in the last 168 hours. No results for input(s): AMMONIA in the last 168 hours. CBC: Recent Labs  Lab 07/23/17 1734 07/25/17 0533 07/26/17 0254 07/27/17 0436  WBC 8.6 7.9 7.7 11.1*  NEUTROABS 5.1  --   --   --   HGB 14.2 14.3 13.6 13.6  HCT 42.0 41.3 40.0 39.9  MCV 90.7 90.0 90.5 89.5  PLT 214 211 213 191   Cardiac Enzymes: No results for input(s): CKTOTAL, CKMB, CKMBINDEX, TROPONINI in the last 168 hours. BNP: Invalid input(s): POCBNP CBG: No results for input(s): GLUCAP in the last 168 hours. D-Dimer No results for input(s): DDIMER in the last 72 hours. Hgb A1c Recent Labs    07/25/17 0533  HGBA1C 5.6   Lipid Profile Recent Labs    07/25/17 0533  CHOL 127  HDL 41  LDLCALC 72  TRIG 69  CHOLHDL 3.1   Thyroid function studies No results for  input(s): TSH, T4TOTAL, T3FREE, THYROIDAB in the last 72 hours.  Invalid input(s): FREET3 Anemia work up No results for input(s): VITAMINB12, FOLATE, FERRITIN, TIBC, IRON, RETICCTPCT in the last 72 hours. Urinalysis    Component Value Date/Time   COLORURINE YELLOW 07/23/2017 1733   APPEARANCEUR CLEAR 07/23/2017 1733   LABSPEC 1.012 07/23/2017 1733   PHURINE 5.0 07/23/2017 1733   GLUCOSEU NEGATIVE 07/23/2017 1733   HGBUR NEGATIVE 07/23/2017 1733   BILIRUBINUR NEGATIVE 07/23/2017 1733   KETONESUR NEGATIVE 07/23/2017 1733   PROTEINUR NEGATIVE 07/23/2017 1733   UROBILINOGEN 0.2 06/17/2012 0149   NITRITE NEGATIVE 07/23/2017 1733   LEUKOCYTESUR NEGATIVE 07/23/2017 1733   Sepsis Labs Invalid input(s): PROCALCITONIN,  WBC,  LACTICIDVEN Microbiology Recent Results (from the past 240 hour(s))  Surgical PCR screen     Status: None   Collection Time: 07/25/17  9:08 PM  Result Value Ref Range Status   MRSA, PCR NEGATIVE NEGATIVE Final   Staphylococcus aureus NEGATIVE NEGATIVE Final    Comment: (NOTE) The Xpert SA Assay (FDA approved for NASAL specimens in patients  81 years of age and older), is one component of a comprehensive surveillance program. It is not intended to diagnose infection nor to guide or monitor treatment. Performed at [facility], [place] [address], [place], [place] [address]      Time coordinating discharge:  I have spent 35 minutes face to face with the patient and on the ward discussing the patients care, assessment, plan and disposition with other care givers. >50% of the time was devoted counseling the patient about the risks and benefits of treatment/Discharge disposition and coordinating care.   SIGNED:   Damita Lack, MD  [facility] 07/27/2017, 11:14 AM Pager   If 7PM-7AM, please contact night-coverage [email or web address] Password [email or web address]

## 2017-07-27 NOTE — Progress Notes (Signed)
Patient ID: [name], male   DOB: 1929/05/06, 84 y.o.   MRN: [id number] Comfortable this morning with minimal disks soreness in his neck.  Logic deficits.  No change in right eye vision  Right neck incision healing without hematoma.  Okay for discharge to home on oral agent as suggested by cardiology.  Will see him in the office in several weeks for follow-up

## 2017-07-27 NOTE — Progress Notes (Addendum)
Patient ID: [name], male   DOB: 03-Mar-1930, 84 y.o.   MRN: [id number]     Advanced Heart Failure Rounding Note  PCP-Cardiologist: No primary care provider on file.   Subjective:    Feeling great this am. Denies SOB or CP. No lightheadedness or dizziness. Read to go home.   Echo: EF 30-35%, mild AI, mild MR, moderate biatrial enlargement.   CT neck: 70% stenosis with ulcerated plaque RICA.  Non-opacified LAA, ?thrombus.    Objective:   Weight Range: 152 lb 14.4 oz (69.4 kg) Body mass index is 24.68 kg/m.   Vital Signs:   Temp:  [97 F (36.1 C)-98.3 F (36.8 C)] 97.8 F (36.6 C) (05/21 0814) Pulse Rate:  [68-86] 74 (05/21 0814) Resp:  [12-32] 23 (05/21 0814) BP: (100-128)/(61-77) 123/73 (05/21 0814) SpO2:  [92 %-99 %] 99 % (05/20 1651) Arterial Line BP: (89-132)/(49-68) 108/51 (05/20 1405) Last BM Date: 07/26/17  Weight change: Filed Weights   07/25/17 1100  Weight: 152 lb 14.4 oz (69.4 kg)    Intake/Output:   Intake/Output Summary (Last 24 hours) at 07/27/2017 0909 Last data filed at 07/27/2017 0700 Gross per 24 hour  Intake 735 ml  Output 1225 ml  Net -490 ml      Physical Exam    General:  Well appearing. No resp difficulty HEENT: Normal Neck: Supple. JVP not elevated. R CEA site C/D/I. No lymphadenopathy or thyromegaly appreciated. Cor: PMI nondisplaced. Regular rate & rhythm. No rubs, gallops or murmurs. Lungs: Clear Abdomen: Soft, nontender, nondistended. No hepatosplenomegaly. No bruits or masses. Good bowel sounds. Extremities: No cyanosis, clubbing, rash, edema Neuro: Alert & orientedx3, cranial nerves grossly intact. moves all 4 extremities w/o difficulty. Affect pleasant  Telemetry   Afib with BiV pacing, 70 bpm, personally reviewed.   Labs    CBC Recent Labs    07/26/17 0254 07/27/17 0436  WBC 7.7 11.1*  HGB 13.6 13.6  HCT 40.0 39.9  MCV 90.5 89.5  PLT 213 119   Basic Metabolic Panel Recent Labs    07/25/17 0533  07/27/17 0436  NA 138 138  K 3.9 4.5  CL 106 107  CO2 24 25  GLUCOSE 93 169*  BUN 22* 15  CREATININE 0.97 0.84  CALCIUM 9.0 8.9   Liver Function Tests No results for input(s): AST, ALT, ALKPHOS, BILITOT, PROT, ALBUMIN in the last 72 hours. No results for input(s): LIPASE, AMYLASE in the last 72 hours. Cardiac Enzymes No results for input(s): CKTOTAL, CKMB, CKMBINDEX, TROPONINI in the last 72 hours.  BNP: BNP (last 3 results) No results for input(s): BNP in the last 8760 hours.  ProBNP (last 3 results) No results for input(s): PROBNP in the last 8760 hours.   D-Dimer No results for input(s): DDIMER in the last 72 hours. Hemoglobin A1C Recent Labs    07/25/17 0533  HGBA1C 5.6   Fasting Lipid Panel Recent Labs    07/25/17 0533  CHOL 127  HDL 41  LDLCALC 72  TRIG 69  CHOLHDL 3.1   Thyroid Function Tests No results for input(s): TSH, T4TOTAL, T3FREE, THYROIDAB in the last 72 hours.  Invalid input(s): FREET3  Other results:   Imaging    No results found.   Medications:     Scheduled Medications: . apixaban  5 mg Oral BID  . aspirin EC  325 mg Oral Daily  . atorvastatin  80 mg Oral q1800  . docusate sodium  100 mg Oral Daily  . furosemide  80  mg Oral Daily  . losartan  12.5 mg Oral Daily  . metoprolol succinate  12.5 mg Oral Daily  . metoprolol succinate  12.5 mg Oral Daily  . mupirocin ointment  1 application Nasal BID  . pantoprazole  40 mg Oral Daily  . potassium chloride SA  20 mEq Oral BID  . predniSONE  1 mg Oral Q breakfast  . sodium chloride flush  3 mL Intravenous Q12H  . spironolactone  25 mg Oral Daily  . tamsulosin  0.4 mg Oral Daily    Infusions: . sodium chloride    . sodium chloride    . sodium chloride 125 mL/hr at 07/26/17 1507  . magnesium sulfate 1 - 4 g bolus IVPB      PRN Medications: sodium chloride, sodium chloride, acetaminophen **OR** acetaminophen, alum & mag hydroxide-simeth, bisacodyl, fluticasone,  guaiFENesin-dextromethorphan, hydrALAZINE, labetalol, magnesium sulfate 1 - 4 g bolus IVPB, metoprolol tartrate, morphine injection, ondansetron, oxyCODONE, phenol, polyvinyl alcohol, potassium chloride, senna-docusate, sodium chloride flush    Assessment/Plan   1. Right central retinal artery occlusion: He has an ulcerated, 70% stenotic plaque in the right internal carotid. Suspect this is the source of embolism for CRAO.  However, there is question on CT of LAA thrombus (non-opacified appendage).  INR has been mostly therapeutic at home (in setting of chronic afib).   - He is currently on heparin gtt pre-CEA.   - He is now switched from coumadin to Eliquis.  - s/p R CEA 07/26/17.  - Statin intensified to atorvastatin 80 daily. 2. Chronic systolic CHF: Echo this admission with EF 30-35%.  - He has St Jude CRT-P device s/p AV nodal ablation.  Probable nonischemic cardiomyopathy. - Volume status stable.  - Continue lasix 80 mg daily.  - Continue Toprol 12.5 mg daily.  - Continue spironolactone 25 mg daily.  - Continue losartan 12.5 mg daily.  3. Atrial fibrillation: Chronic. - He has had AV nodal ablation with BiV pacing.   - Transitioned from warfarin to Eliquis 5 mg BID as directed.   Stable for home per Vascular.   HF meds for home - Eliquis 5 mg BId - Lasix 80 mg daily - Toprol 12.5 mg daily - Spironolactone 25 mg daily.  - Losartan 12.5 mg daily.   Will arrange for follow up in HF clinic.   Length of Stay: 3  Annamaria Helling  07/27/2017, 9:09 AM  Advanced Heart Failure Team Pager [phone] (M-F; 7a - 4p)  Please contact [facility] for night-coverage after hours (4p -7a ) and weekends on [email or web address]  Patient seen with PA, agree with the above note.  He had right CEA yesterday.  He has started Eliquis 5 mg bid for home to replace warfarin.  Would not continue aspirin given Eliquis use.  We will arrange followup in CHF clinic.   [name] 07/27/2017 12:20 PM

## 2017-07-27 NOTE — Consult Note (Signed)
            [facility] CM Primary Care Navigator  07/27/2017  [name] 07/03/1929 [id number]   Went toseepatient at the bedside to identify possible discharge needsbuthe was already dischargedhomeperstaffreport.  Per chart review, patient had developed decreased vision to right eye, was seen by ophthalmologist who diagnosed him with central retinal artery occlusion. Patient was sent to the hospital and vascular surgery recommended getting cardiac clearance prior to proceeding with management of the ICA stenosis. Patient had an echocardiogram which showed ejection fraction 30-35% and mild to moderate regurgitation and moderate bilateral atrial enlargement. He underwent right carotid endarterectomy and angioplasty.   Primary care provider's officeis listed as providingtransition of care (TOC)follow-up.   Patient has discharge instruction to follow-up withprimary care provider in 1 week, vascular surgery/ cardiology in 2 weeks and  follow-up with HF Clinic will be arranged for patient.   For additional questions please contact:  Edwena Felty A. Nadiah Corbit, BSN, RN-BC [facility] PRIMARY CARE Navigator Cell: [phone]

## 2017-07-27 NOTE — Care Management Note (Signed)
Case Management Note Marvetta Gibbons RN, BSN Unit 4E-Case Manager [phone]  Patient Details  Name: [name] MRN: [id number] Date of Birth: 11-02-1929  Subjective/Objective:   Pt admitted S/P right CEA symptomatic ICA stenosis Right retinal artery occlusion                 Action/Plan: PTA pt lived at home with wife- [place] is [place] at [employer]- [place], is requesting that ride be called and arranged with [employer] transportation- [phone]- bedside RN is going to call when pt ready for d/c. Referral received for new Eliquis- spoke with pt at bedside- per pt [facility] [address]- 30 day free card provided to pt for use with prescription on discharge. Pt will f/u with his [employer] drug plan for copay cost/coverage.   Expected Discharge Date:  07/27/17               Expected Discharge Plan:  Home/Self Care  In-House Referral:  NA  Discharge planning Services  CM Consult, Medication Assistance  Post Acute Care Choice:  NA Choice offered to:  NA  DME Arranged:    DME Agency:     HH Arranged:    HH Agency:     Status of Service:  Completed, signed off  If discussed at [place] of Stay Meetings, dates discussed:    Discharge Disposition: home/self care   Additional Comments:  Dawayne Patricia, RN 07/27/2017, 11:01 AM

## 2017-07-28 ENCOUNTER — Telehealth: Payer: Self-pay | Admitting: *Deleted

## 2017-07-28 NOTE — Progress Notes (Signed)
Per insurance check for Eliquis  # 3. S/W TATOO @ [employer] RX PLAN PDP [phone]    ELIQUIS 5 MG BID  COVER- YES  CO-PAY- $ 427.97  TIER- 3 DRUG  PRIOR APPROVAL- NO   DEDUCTIBLE NOT MET   PREFERRED PHARMACY : [facility]

## 2017-07-28 NOTE — Telephone Encounter (Signed)
Per chart review: Admit date: 07/23/2017 Discharge date: 07/27/2017  Admitted From: Home Disposition:  Home  Recommendations for Outpatient Follow-up:  1. Follow up with PCP in 1-2 weeks 2. Please obtain BMP/CBC in one week your next doctors visit.  3. CHF team to arrange for follow-up 4. Coumadin has been stopped, started Eliquis 5 mg twice daily 5. Follow-up with Dr Donnetta Hutching in "Several weeks"   Home Health: Home Equipment/Devices: None Discharge Condition: Stable CODE STATUS: Full code Diet recommendation: Cardiac diet ________________________________________________________________ Per telephone call: Transition Care Management Follow-up Telephone Call   Date discharged? 07/27/17   How have you been since you were released from the hospital? "fine"   Do you understand why you were in the hospital? yes   Do you understand the discharge instructions? yes   Where were you discharged to? Home   Items Reviewed:  Medications reviewed: yes  Allergies reviewed: yes  Dietary changes reviewed: yes  Referrals reviewed: yes   Functional Questionnaire:   Activities of Daily Living (ADLs):   He states they are independent in the following: ambulation, bathing and hygiene, feeding, continence, grooming, toileting and dressing States they require assistance with the following: none   Any transportation issues/concerns?: no   Any patient concerns? no   Confirmed importance and date/time of follow-up visits scheduled yes  Provider Appointment booked with 08/04/17  Confirmed with patient if condition begins to worsen call PCP or go to the ER.  Patient was given the office number and encouraged to call back with question or concerns.  : yes

## 2017-07-28 NOTE — Telephone Encounter (Signed)
noted thanks  

## 2017-08-04 ENCOUNTER — Inpatient Hospital Stay: Payer: Medicare Other | Admitting: Family Medicine

## 2017-08-04 ENCOUNTER — Encounter: Payer: Self-pay | Admitting: Family Medicine

## 2017-08-04 ENCOUNTER — Ambulatory Visit (INDEPENDENT_AMBULATORY_CARE_PROVIDER_SITE_OTHER): Payer: Medicare Other | Admitting: Family Medicine

## 2017-08-04 VITALS — BP 100/68 | HR 70 | Temp 97.6°F | Ht 66.0 in | Wt 154.6 lb

## 2017-08-04 DIAGNOSIS — I428 Other cardiomyopathies: Secondary | ICD-10-CM

## 2017-08-04 DIAGNOSIS — I482 Chronic atrial fibrillation, unspecified: Secondary | ICD-10-CM

## 2017-08-04 DIAGNOSIS — I2510 Atherosclerotic heart disease of native coronary artery without angina pectoris: Secondary | ICD-10-CM

## 2017-08-04 DIAGNOSIS — E785 Hyperlipidemia, unspecified: Secondary | ICD-10-CM

## 2017-08-04 DIAGNOSIS — Z8669 Personal history of other diseases of the nervous system and sense organs: Secondary | ICD-10-CM | POA: Diagnosis not present

## 2017-08-04 DIAGNOSIS — I5022 Chronic systolic (congestive) heart failure: Secondary | ICD-10-CM

## 2017-08-04 LAB — CBC
HCT: 43.6 % (ref 39.0–52.0)
Hemoglobin: 14.7 g/dL (ref 13.0–17.0)
MCHC: 33.7 g/dL (ref 30.0–36.0)
MCV: 92.7 fl (ref 78.0–100.0)
PLATELETS: 247 10*3/uL (ref 150.0–400.0)
RBC: 4.7 Mil/uL (ref 4.22–5.81)
RDW: 14 % (ref 11.5–15.5)
WBC: 7.8 10*3/uL (ref 4.0–10.5)

## 2017-08-04 LAB — BASIC METABOLIC PANEL
BUN: 24 mg/dL — AB (ref 6–23)
CHLORIDE: 100 meq/L (ref 96–112)
CO2: 30 meq/L (ref 19–32)
Calcium: 9.5 mg/dL (ref 8.4–10.5)
Creatinine, Ser: 0.86 mg/dL (ref 0.40–1.50)
GFR: 89.57 mL/min (ref >60.00)
GLUCOSE: 78 mg/dL (ref 70–99)
Potassium: 4.2 mEq/L (ref 3.5–5.1)
SODIUM: 138 meq/L (ref 135–145)

## 2017-08-04 NOTE — Assessment & Plan Note (Signed)
Patient remains asymptomatic. 

## 2017-08-04 NOTE — Patient Instructions (Signed)
Please stop by lab before you go  No changes today.   Glad you are healing well

## 2017-08-04 NOTE — Assessment & Plan Note (Signed)
Last direct LDL 69.  Continue pravastatin.

## 2017-08-04 NOTE — Assessment & Plan Note (Signed)
Echocardiogram largely stable with ejection fraction at 30 to 35%.  No signs of fluid overload

## 2017-08-04 NOTE — Progress Notes (Signed)
Subjective:  [name] is a 84 y.o. year old very pleasant male patient who presents for transitional care management and hospital follow up for central retinal artery occlusion. Patient was hospitalized from 07/23/17 to 07/27/17. A TCM phone call was completed on 07/28/17. Medical complexity moderate  Patient is an 84 year old man on Coumadin for atrial fibrillation, coronary artery disease, pacemaker due to complete heart block, polymyalgia rheumatica, nonischemic cardiomyopathy with ejection fraction of approximately 30 to 35%, who was sent to the hospital by his ophthalmologist for management of central retinal artery occlusion.  Patient suddenly lost what he approximates is the lower right half of his visual field on 17 May.  He went to see his ophthalmologist who diagnosed him with central retinal artery occlusion and sent him directly to the hospital.  He had a CT angiogram which showed an extremely irregular high-grade stenosis of at least 90% of the ICA-Per discharge summary-though CT angiogram report states 70%.  He was evaluated by neurology and vascular surgery.  Vascular surgery recommended getting cardiac clearance prior to proceeding with carotid endarterectomy.  I personally reviewed his CT of the head from 07/23/2017.  There were no acute intracranial findings-specifically no mass, hemorrhage, edema.  I also reviewed the CT angiogram of the neck from 07/24/2017 and noted plaque in both the right and left carotid artery greater on right than left.  In the portion of the lung that was visualized- there was some airway thickening and left hilar calcifications.  Coronary atherosclerosis was noted.  In regards to the CTA of the head- plaque in carotid arteries once again noted  Cardiac evaluation showed stable ejection fraction at 30 to 35%, mild aortic insufficiency, mild to moderate regurgitation of aortic valve and moderate bilateral atrial enlargement.  Coumadin was held and patient was  sent for right carotid endarterectomy and angioplasty on 20 May.  Patient did well with the procedure and the following day he felt better overall and noticed improvement in his vision-he states he now has loss of vision in central portion of visual field but much smaller overall- he describes it as a small blurry spot about the size of 2 dimes.  He has not noted further improvement since leaving the hospital.   Cardiology recommended to transition from Coumadin to Eliquis 5 mg twice a day.  Patient would like to discuss this further with cardiology as he states this medication is very expensive for him.  He approximates about $1200 a year.  With that being said he realizes this may be the better option.  He is also interested in perhaps increasing his INR goa. As an alternate  Patient already has follow-up with the heart failure clinic and vascular surgery.  History of atrial fibrillation status post AV nodal ablation with pacemaker in place.  Patient was continued on Eliquis after transition from Coumadin.  He has a 30-day supply available through coupon. He is rate controlled with combo of metoprolol and pacemaker  His other chronic cardiac conditions were maintained on home medications including Toprol extended release 12.5 mg, Aldactone 25 mg, Lipitor 80 mg for hyperlipidemia, PRN Lasix.  He was continued on very low-dose prednisone as he is currently on a wean for his polymyalgia rheumatica.  He was continued on Flomax for his BPH.   See problem oriented charting as well ROS-some loss of vision.  No chest pain or shortness of breath or edema reported.  No headache.  Past Medical History-  Patient Active Problem List  Diagnosis Date Noted  . History of central retinal artery occlusion 08/04/2017    Priority: High  . Stenosis of right internal carotid artery s/p endarterectomy 07/2017     Priority: High  . Chronic systolic CHF (congestive heart failure) ([place]) 03/21/2013    Priority: High   . Nonischemic cardiomyopathy ([place]) 09/20/2010    Priority: High  . PPM-St.Jude 11/27/2008    Priority: High  . CAD (coronary artery disease) 07/20/2008    Priority: High  . SICK SINUS SYNDROME 07/20/2008    Priority: High  . Polymyalgia rheumatica ([place]) 07/03/2008    Priority: High  . Atrial fibrillation ([place]) 03/16/2007    Priority: High  . AK (actinic keratosis) 01/28/2016    Priority: Medium  . Lipoma 07/08/2015    Priority: Medium  . Neuropathy 05/16/2015    Priority: Medium  . Chronic cough 08/18/2013    Priority: Medium  . Pleural effusion, right 08/16/2013    Priority: Medium  . Pleural effusion on right 07/06/2012    Priority: Medium  . Hyperlipidemia 03/16/2007    Priority: Medium  . BPH (benign prostatic hyperplasia) 03/16/2007    Priority: Medium  . Thrombus of left atrial appendage without antecedent myocardial infarction 07/24/2017    Priority: Low  . Traumatic hematoma 04/01/2017    Priority: Low  . Left knee pain 10/23/2016    Priority: Low  . Prolapsed internal hemorrhoids, grade 2 - causing fecal seepage 06/10/2016    Priority: Low  . Chronic anticoagulation 02/10/2016    Priority: Low  . Left breast lump 07/15/2015    Priority: Low  . Trigger thumb of right hand 07/04/2014    Priority: Low  . Encounter for therapeutic drug monitoring 07/02/2014    Priority: Low  . Allergic rhinitis 02/14/2014    Priority: Low  . Fecal incontinence 10/18/2013    Priority: Low  . Recurrent right inguinal hernia 11/22/2012    Priority: Low  . Intention tremor 11/01/2012    Priority: Low  . MIXED HEARING LOSS BILATERAL 01/10/2008    Priority: Low  . GERD 03/16/2007    Priority: Low    Medications- reviewed and updated  A medical reconciliation was performed comparing current medicines to hospital discharge medications. Current Outpatient Medications  Medication Sig Dispense Refill  . acetaminophen (TYLENOL) 500 MG tablet Take 1,000 mg by mouth every 6  (six) hours as needed for moderate pain or headache.    Marland Kitchen apixaban (ELIQUIS) 5 MG TABS tablet Take 1 tablet (5 mg total) by mouth 2 (two) times daily. 60 tablet 0  . aspirin EC 81 MG tablet Take 1 tablet (81 mg total) by mouth daily. 150 tablet 2  . fluticasone (FLONASE) 50 MCG/ACT nasal spray Place 2 sprays into both nostrils daily as needed for allergies or rhinitis. 16 g 3  . furosemide (LASIX) 80 MG tablet Take 1 tablet (80 mg total) by mouth 2 (two) times daily. Please call for office visit [phone] (Patient taking differently: Take 80 mg by mouth daily. ) 180 tablet 0  . hydroxypropyl methylcellulose / hypromellose (ISOPTO TEARS / GONIOVISC) 2.5 % ophthalmic solution Place 1 drop into both eyes daily as needed for dry eyes.    Marland Kitchen KLOR-CON M20 20 MEQ tablet TAKE 1 TABLET TWICE DAILY 180 tablet 2  . losartan (COZAAR) 25 MG tablet Take 1 tablet (25 mg total) by mouth daily. Please call for office visit [phone] 90 tablet 0  . metoprolol succinate (TOPROL-XL) 25 MG 24 hr tablet Take  0.5 tablets (12.5 mg total) by mouth daily. Please call for office visit [phone] 45 tablet 0  . pravastatin (PRAVACHOL) 80 MG tablet TAKE 1/2 TABLET (40 MG TOTAL) DAILY 45 tablet 3  . predniSONE (DELTASONE) 1 MG tablet Take 1-2 tablets (1-2 mg total) by mouth daily with breakfast. (Patient taking differently: Take 1 mg by mouth daily with breakfast. ) 60 tablet 1  . spironolactone (ALDACTONE) 25 MG tablet Take 1 tablet (25 mg total) by mouth daily. Please call for office visit [phone] 90 tablet 0  . tamsulosin (FLOMAX) 0.4 MG CAPS capsule TAKE 1 CAPSULE EVERY DAY 90 capsule 3   No current facility-administered medications for this visit.     Objective: BP 100/68 (BP Location: Left Arm, Patient Position: Sitting, Cuff Size: Normal)   Pulse 70   Temp 97.6 F (36.4 C) (Oral)   Ht 5\' 6"  (1.676 m)   Wt 154 lb 9.6 oz (70.1 kg)   SpO2 93%   BMI 24.95 kg/m  Gen: NAD, resting comfortably CV:  irregularly irregular, no murmurs rubs or gallops Lungs: CTAB no crackles, wheeze, rhonchi Abdomen: soft/nontender/nondistended/normal bowel sounds. No rebound or guarding.  Ext: no edema Skin: warm, dry Neuro: CN II-XII intact other than some central vision loss in right eye, sensation and reflexes normal throughout, 5/5 muscle strength in bilateral upper and lower extremities. Normal finger to nose. No pronator drift. Normal gait.   Assessment/Plan:  History of central retinal artery occlusion Suspect likely long-term- Some loss of right visual field.  He has been transitioned from Coumadin to Eliquis.  He is status post right carotid endarterectomy.  He has follow-up with Dr. early of vascular surgery planned.  Chronic systolic CHF (congestive heart failure) (HCC) No signs of fluid overload. He remains on Lasix 80mg , spironolactone 12.5mg , and potassium (no hyperkalemia), losartan 12.5mg , metoprolol. EF stable in hospital at 30-35%  Atrial fibrillation [facility]) Patient will remain on Eliquis discussed this with you.  He wants to discuss possibly moving INR goal up to 2.5-3.5 instead as he is concerned about eliquis cost. I told him I would defer to cardiology expert opinion on this but I think the move to eliquis was appropriate.   CAD (coronary artery disease) Patient remains asymptomatic  Nonischemic cardiomyopathy ([place]) Echocardiogram largely stable with ejection fraction at 30 to 35%.  No signs of fluid overload  Hyperlipidemia Last direct LDL 69.  Continue pravastatin.    Future Appointments  Date Time Provider [place]  08/17/2017  9:15 AM Early, Arvilla Meres, MD [facility]  08/18/2017  9:30 AM [facility] PA/NP [facility] None  09/14/2017  9:45 AM Yong Channel Brayton Mars, MD [facility]  already scheduled for July follow up  Lab/Order associations: Chronic atrial fibrillation ([place]) - Plan: CBC, Basic metabolic panel Labs per hospitalist request  Return precautions advised.  Garret Reddish, MD

## 2017-08-04 NOTE — Assessment & Plan Note (Signed)
Suspect likely long-term- Some loss of right visual field.  He has been transitioned from Coumadin to Eliquis.  He is status post right carotid endarterectomy.  He has follow-up with Dr. early of vascular surgery planned.

## 2017-08-04 NOTE — Progress Notes (Signed)
Your CBC was normal (blood counts, infection fighting cells, platelets). Your BMET was normal (kidney, electrolytes, blood sugar)- your BUN is slightly high but that is probably due to the lasix we have you on which we want you to continue.

## 2017-08-04 NOTE — Assessment & Plan Note (Signed)
No signs of fluid overload. He remains on Lasix 80mg , spironolactone 12.5mg , and potassium (no hyperkalemia), losartan 12.5mg , metoprolol. EF stable in hospital at 30-35%

## 2017-08-04 NOTE — Assessment & Plan Note (Signed)
Patient will remain on Eliquis discussed this with you.  He wants to discuss possibly moving INR goal up to 2.5-3.5 instead as he is concerned about eliquis cost. I told him I would defer to cardiology expert opinion on this but I think the move to eliquis was appropriate.

## 2017-08-17 ENCOUNTER — Ambulatory Visit (INDEPENDENT_AMBULATORY_CARE_PROVIDER_SITE_OTHER): Payer: Self-pay | Admitting: Vascular Surgery

## 2017-08-17 ENCOUNTER — Encounter: Payer: Self-pay | Admitting: Vascular Surgery

## 2017-08-17 ENCOUNTER — Other Ambulatory Visit: Payer: Self-pay

## 2017-08-17 VITALS — BP 104/69 | HR 74 | Temp 97.0°F | Resp 18 | Ht 66.0 in | Wt 157.8 lb

## 2017-08-17 DIAGNOSIS — I6529 Occlusion and stenosis of unspecified carotid artery: Secondary | ICD-10-CM

## 2017-08-17 NOTE — Progress Notes (Signed)
   Patient name: [name] MRN: [id number] DOB: 1929/08/11 Sex: male  REASON FOR VISIT: Follow-up recent carotid endarterectomy for symptomatic disease on 07/26/2017  HPI: [name] is a 84 y.o. male here today for follow-up.  He did quite well with the surgery was discharged home on postoperative day 1.  He does have a usual amount of peri-incisional numbness and some jaw soreness but has had no difficulty.  Has had no wound problems and no new neurologic deficits.  Current Outpatient Medications  Medication Sig Dispense Refill  . acetaminophen (TYLENOL) 500 MG tablet Take 1,000 mg by mouth every 6 (six) hours as needed for moderate pain or headache.    Marland Kitchen apixaban (ELIQUIS) 5 MG TABS tablet Take 1 tablet (5 mg total) by mouth 2 (two) times daily. 60 tablet 0  . aspirin EC 81 MG tablet Take 1 tablet (81 mg total) by mouth daily. 150 tablet 2  . fluticasone (FLONASE) 50 MCG/ACT nasal spray Place 2 sprays into both nostrils daily as needed for allergies or rhinitis. 16 g 3  . furosemide (LASIX) 80 MG tablet Take 1 tablet (80 mg total) by mouth 2 (two) times daily. Please call for office visit [phone] (Patient taking differently: Take 80 mg by mouth daily. ) 180 tablet 0  . hydroxypropyl methylcellulose / hypromellose (ISOPTO TEARS / GONIOVISC) 2.5 % ophthalmic solution Place 1 drop into both eyes daily as needed for dry eyes.    Marland Kitchen KLOR-CON M20 20 MEQ tablet TAKE 1 TABLET TWICE DAILY 180 tablet 2  . losartan (COZAAR) 25 MG tablet Take 1 tablet (25 mg total) by mouth daily. Please call for office visit [phone] 90 tablet 0  . metoprolol succinate (TOPROL-XL) 25 MG 24 hr tablet Take 0.5 tablets (12.5 mg total) by mouth daily. Please call for office visit [phone] 45 tablet 0  . pravastatin (PRAVACHOL) 80 MG tablet TAKE 1/2 TABLET (40 MG TOTAL) DAILY 45 tablet 3  . predniSONE (DELTASONE) 1 MG tablet Take 1-2 tablets (1-2 mg total) by mouth  daily with breakfast. (Patient taking differently: Take 1 mg by mouth daily with breakfast. ) 60 tablet 1  . spironolactone (ALDACTONE) 25 MG tablet Take 1 tablet (25 mg total) by mouth daily. Please call for office visit [phone] 90 tablet 0  . tamsulosin (FLOMAX) 0.4 MG CAPS capsule TAKE 1 CAPSULE EVERY DAY 90 capsule 3   No current facility-administered medications for this visit.      PHYSICAL EXAM: Vitals:   08/17/17 0913 08/17/17 0915  BP: 101/66 104/69  Pulse: 74   Resp: 18   Temp: (!) 97 F (36.1 C)   TempSrc: Oral   SpO2: 97%   Weight: 157 lb 12.8 oz (71.6 kg)   Height: 5\' 6"  (1.676 m)     GENERAL: The patient is a well-nourished male, in no acute distress. The vital signs are documented above. Right neck incision is healed quite nicely.  Neurologically intact  MEDICAL ISSUES: Stable following carotid endarterectomy for symptomatic disease.  We will see him again in 9 months with repeat carotid duplex.  He will notify should he develop any neurologic deficits or wound issues   Rosetta Posner, MD [facility] Tel [phone] Pager [phone]

## 2017-08-18 ENCOUNTER — Encounter (HOSPITAL_COMMUNITY): Payer: Medicare Other

## 2017-08-18 ENCOUNTER — Ambulatory Visit (HOSPITAL_COMMUNITY)
Admission: RE | Admit: 2017-08-18 | Discharge: 2017-08-18 | Disposition: A | Discharge status: Home / Self Care | Payer: Medicare Other | Source: Ambulatory Visit | Attending: Internal Medicine | Admitting: Internal Medicine

## 2017-08-18 ENCOUNTER — Encounter (HOSPITAL_COMMUNITY): Payer: Self-pay

## 2017-08-18 VITALS — BP 110/64 | HR 69 | Wt 156.0 lb

## 2017-08-18 DIAGNOSIS — Z950 Presence of cardiac pacemaker: Secondary | ICD-10-CM | POA: Insufficient documentation

## 2017-08-18 DIAGNOSIS — Z9889 Other specified postprocedural states: Secondary | ICD-10-CM

## 2017-08-18 DIAGNOSIS — Z7982 Long term (current) use of aspirin: Secondary | ICD-10-CM | POA: Insufficient documentation

## 2017-08-18 DIAGNOSIS — I6521 Occlusion and stenosis of right carotid artery: Secondary | ICD-10-CM | POA: Diagnosis not present

## 2017-08-18 DIAGNOSIS — N400 Enlarged prostate without lower urinary tract symptoms: Secondary | ICD-10-CM | POA: Diagnosis not present

## 2017-08-18 DIAGNOSIS — E785 Hyperlipidemia, unspecified: Secondary | ICD-10-CM | POA: Diagnosis not present

## 2017-08-18 DIAGNOSIS — M353 Polymyalgia rheumatica: Secondary | ICD-10-CM | POA: Insufficient documentation

## 2017-08-18 DIAGNOSIS — Z7901 Long term (current) use of anticoagulants: Secondary | ICD-10-CM | POA: Insufficient documentation

## 2017-08-18 DIAGNOSIS — I428 Other cardiomyopathies: Secondary | ICD-10-CM | POA: Insufficient documentation

## 2017-08-18 DIAGNOSIS — I481 Persistent atrial fibrillation: Secondary | ICD-10-CM | POA: Diagnosis not present

## 2017-08-18 DIAGNOSIS — I4891 Unspecified atrial fibrillation: Secondary | ICD-10-CM

## 2017-08-18 DIAGNOSIS — I442 Atrioventricular block, complete: Secondary | ICD-10-CM | POA: Insufficient documentation

## 2017-08-18 DIAGNOSIS — I5022 Chronic systolic (congestive) heart failure: Secondary | ICD-10-CM | POA: Diagnosis not present

## 2017-08-18 DIAGNOSIS — I2510 Atherosclerotic heart disease of native coronary artery without angina pectoris: Secondary | ICD-10-CM | POA: Insufficient documentation

## 2017-08-18 DIAGNOSIS — Z79899 Other long term (current) drug therapy: Secondary | ICD-10-CM | POA: Diagnosis not present

## 2017-08-18 DIAGNOSIS — K219 Gastro-esophageal reflux disease without esophagitis: Secondary | ICD-10-CM | POA: Insufficient documentation

## 2017-08-18 DIAGNOSIS — Z87891 Personal history of nicotine dependence: Secondary | ICD-10-CM | POA: Diagnosis not present

## 2017-08-18 DIAGNOSIS — Z7952 Long term (current) use of systemic steroids: Secondary | ICD-10-CM | POA: Insufficient documentation

## 2017-08-18 NOTE — Progress Notes (Signed)
Advanced Heart Failure Clinic Note  Patient ID: [name], male   DOB: Feb 17, 1930, 84 y.o.   MRN: [id number] PCP: Dr. Yong Channel Cardiology: Dr. Gerarda Fraction [name] is a 84 y.o. male with history of paroxysmal atrial fibrillation, nonischemic cardiomyopathy, and complete heart block with St Jude CRT-P system presents for cardiology followup.  He was admitted in 4/14 with pacemaker pocket infection.  His pacemaker was removed and temporary-permanent device was placed.  Later, he had the temporary-permanent device removed and a new CRT-P device was placed. He developed dyspnea post-operatively and was found to have a large right-sided pleural effusion.  He was admitted and got a chest tube for drainage. He had followup with Dr. Servando Snare and it was decided that he would not need VATS.  He had been on amiodarone for maintenance of NSR.  This had been more successful than Tikosyn.   Around 3/15, he started developing increasing exertional dyspnea.  He was short of breath walking up a hill or incline.  He could still walk on the treadmill for exercise for 10-15 minutes and use the elliptical without much trouble on most days.  No orthopnea or PND.  No chest pain.  He saw Dr Leanne Chang and was started on Lasix three times a week due to concern for volume overload.  This did not seem to have helped much.  After that, he had PFTs done which showed normal spirometry but DLCO 50% predicted.  Therefore, amiodarone was stopped due to concern for lung toxicity.  He saw Richardson Dopp for a pre-operative evaluation prior to right inguinal hernia repair.  Given the exertional dyspnea, he was set up for adenosine Cardiolite in 5/15.  This showed EF 36% with a small area of primarily scar in the apex, apical lateral wall, and mid anterolateral wall.  He has not felt palpitations. Echo in 5/15 showed EF 40-45%, similar to prior study.   I had him see pulmonary for evaluation for amiodarone lung toxicity.  CT chest  looked ok, and he was not thought to have amiodarone lung toxicity.   At a prior appointment in 2016, [name] was noted to be back in atrial fibrillation.  He was more short of breath with exertion and fatigued.  He has historically tolerated atrial fibrillation poorly.  I did a TEE-guided DCCV in 5/16 back to NSR.  TEE showed EF 35-40%.  He did not immediately feel better like he has in the past post-DCCV, so Lasix was increased.    Recurrent symptomatic atrial fibrillation in 8/17 with TEE-guided DCCV to NSR.  TEE showed EF 25-30%.  Recurrent symptomatic atrial fibrillation with CHF exacerbation in 9/17.  He had repeat DCCV and Lasix was increased.   He was back in atrial fibrillation in 11/17 and appears to have been out of rhythm since then.  He was more short of breath in atrial fibrillation with a significant fall in BiV pacing percentage.  Lasix was significantly increased to try to cope with volume overload.  In 2/18, he had AV nodal ablation to promote BiV pacing.    Admitted 5/17-5/21/19 with right eye vision loss. S/p R CEA on 5/20. Switched from coumadin to Eliquis. Volume was stable.   Today he presents for post hospital follow up. Overall doing well. He still has a little right vision loss, but is getting used to it. Exercising on the eliptical and bike with mild SOB. No SOB otherwise. Denies orthopnea, PND, or edema. No CP, dizziness, or palpitations.  Denies bleeding on eliquis. SBP 90-100s at home. Weights ~150 lbs at home. Taking all medications.   PMH: 1. Low back pain 2. Hyperlipidemia 3. GERD 4. BPH 5. Polymyalgia rheumatica 6. Atrial fibrillation: Failed Tikosyn in the past.  Paroxysmal, on amiodarone and warfarin.  DCCV in 4/13, 7/13, 10/13.  Has held NSR with amiodarone.  However, concern for amiodarone lung toxicity: PFTs (4/15) with FVC 90% predicted, FEV1 90%, ratio 97%, DLCO 50%.   Recurrence of atrial fibrillation in 5/16, TEE-guided DCCV back to NSR in 5/16.  -  Recurrence of atrial fibrillation in 8/17. TEE-guided DCCV 11/07/15.  - Recurrence of atrial fibrillation 9/17.  DCCV 11/20/15 but back in atrial fibrillation by 11/26/15.  - Amiodarone stopped due to persistent atrial fibrillation.  - AV nodal ablation in 2/18 to promote BiV pacing.  7. Transaminitis: Mild, attributed to amiodarone.  8. Complete heart block s/p St Jude CRT-P device.  Patient developed PCM pocket infection in 4/14 and had his first system removed with placement of a temporary permanent PCM.  He later had CRT-P device replaced.  This was complicated by large right-sided hemorrhagic pleural effusion requiring chest tube.   9. Nonischemic cardiomyopathy: Echo (9/13) with EF 40-45%, diffuse HK, mild Craig.  Mild CAD only on prior LHC.  Echo (4/14) with EF 45-50%.  Adenosine Cardiolite (5/15) with EF 36% (visually appeared higher per report), small area of scar with peri-infarct ischemia in the apex, apical lateral wall, and mid anterolateral wall. Echo (5/15) with EF 40-45%, basal to mid inferolateral severe hypokinesis, basal to mid anterolateral hypokinesis, mildly dilated RV with mildly decreased RV systolic function, mild to moderate Craig.  TEE (5/16) with EF 35-40%, inferior and inferolateral severe hypokinesis, normal RV size with mildly decreased systolic function, moderate Craig, peak RV-RA gradient 26 mmHg.  - TEE (8/17): EF 25-30%, mildly dilated RV with mildly decreased systolic function, moderate biatrial enlargement, moderate central Craig.  - AV nodal ablation 2/18.  10. Large right pleural effusion requiring chest tube following removal and reimplantation of PCM. Most recent chest CT in 6/15 showed significant decrease in size of loculated pleural effusion since 5/14.    SH: Married, prior smoker (quit 1985), no ETOH x years, retired, lives at [employer]  [place]: Father with MI at 2, mother with AAA.   Review of systems complete and found to be negative unless listed in HPI.   Current  Outpatient Medications  Medication Sig Dispense Refill  . acetaminophen (TYLENOL) 500 MG tablet Take 1,000 mg by mouth every 6 (six) hours as needed for moderate pain or headache.    Marland Kitchen apixaban (ELIQUIS) 5 MG TABS tablet Take 1 tablet (5 mg total) by mouth 2 (two) times daily. 60 tablet 0  . aspirin EC 81 MG tablet Take 1 tablet (81 mg total) by mouth daily. 150 tablet 2  . fluticasone (FLONASE) 50 MCG/ACT nasal spray Place 2 sprays into both nostrils daily as needed for allergies or rhinitis. 16 g 3  . furosemide (LASIX) 80 MG tablet Take 80 mg by mouth daily.    . hydroxypropyl methylcellulose / hypromellose (ISOPTO TEARS / GONIOVISC) 2.5 % ophthalmic solution Place 1 drop into both eyes daily as needed for dry eyes.    Marland Kitchen KLOR-CON M20 20 MEQ tablet TAKE 1 TABLET TWICE DAILY 180 tablet 2  . losartan (COZAAR) 25 MG tablet Take 12.5 mg by mouth daily.    . metoprolol succinate (TOPROL-XL) 25 MG 24 hr tablet Take 0.5 tablets (12.5 mg  total) by mouth daily. Please call for office visit [phone] 45 tablet 0  . pravastatin (PRAVACHOL) 80 MG tablet TAKE 1/2 TABLET (40 MG TOTAL) DAILY 45 tablet 3  . predniSONE (DELTASONE) 1 MG tablet Take 1-2 tablets (1-2 mg total) by mouth daily with breakfast. (Patient taking differently: Take 1 mg by mouth daily with breakfast. ) 60 tablet 1  . spironolactone (ALDACTONE) 25 MG tablet Take 12.5 mg by mouth daily.    . tamsulosin (FLOMAX) 0.4 MG CAPS capsule TAKE 1 CAPSULE EVERY DAY 90 capsule 3   No current facility-administered medications for this encounter.     BP 110/64   Pulse 69   Wt 156 lb (70.8 kg)   SpO2 94%   BMI 25.18 kg/m   Wt Readings from Last 3 Encounters:  08/18/17 156 lb (70.8 kg)  08/17/17 157 lb 12.8 oz (71.6 kg)  08/04/17 154 lb 9.6 oz (70.1 kg)   General: Well appearing. No resp difficulty. HEENT: Normal Neck: Supple. JVP flat. Carotids 2+ bilat; no bruits. No thyromegaly or nodule noted. Cor: PMI nondisplaced. RRR, No M/G/R  noted Lungs: CTAB, normal effort. Abdomen: Soft, non-tender, non-distended, no HSM. No bruits or masses. +BS  Extremities: No cyanosis, clubbing, or rash. R and LLE no edema.  Neuro: Alert & orientedx3, cranial nerves grossly intact. moves all 4 extremities w/o difficulty. Affect pleasant  Assessment/Plan: 1. Chronic systolic CHF:  Presumed nonischemic cardiomyopathy (prior cath with only mild CAD).  He had an adenosine Cardiolite in 5/15 with EF 36% and possible small area of apical and lateral scar with peri-infarct ischemia. He has St Jude CRT-P.  TEE in 8/17 showed EF 25-30% . - Echo 07/2017: EF 30-35% - NYHA class II - Volume status stable - Continue lasix 80 mg daily.  - Continue Toprol 12.5 mg daily.  - Continue spironolactone 12.5 mg daily.  - Continue losartan 12.5 mg daily. No BP room for increase. SBP 90-100s at home.  - He is not interested in ICD upgrade. 2. Atrial fibrillation: Now persistent.  Had breakthrough with Tikosyn and now not able to hold NSR on amiodarone. He is now off amiodarone.  He is status post AV nodal ablation with significant improvement in symptoms and BiV pacing percentage.  - Recently switched to apixiban 5 mg BID. Denies bleeding. Provided samples today.  3. Hyperlipidemia: History of nonobstructive CAD.   - Continue pravastatin 40 mg daily 4. 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 Suspect this is the source of embolism for CRAO.  However, there is question on CT of LAA thrombus (non-opacified appendage).  INR had been mostly therapeutic at home (in setting of chronic afib).   - He was switched from coumadin to Eliquis. Denies bleeding.  - Continue statin  Doing really well. No BP room for med titration. Recent labs stable. Will have him follow up with Dr Aundra Dubin in 3 months.   Georgiana Shore, NP 08/18/2017  Greater than 50% of the 25 minute visit was spent in counseling/coordination  of care regarding disease state education, salt/fluid restriction, sliding scale diuretics, and medication compliance.

## 2017-08-18 NOTE — Patient Instructions (Signed)
No changes to medication at this time.  No lab work today.  Follow up 3-4 months with Dr. Aundra Dubin.  ____________________________________________________________________ [name] Code: 9935  Take all medication as prescribed the day of your appointment. Bring all medications with you to your appointment.  Do the following things EVERYDAY: 1) Weigh yourself in the morning before breakfast. Write it down and keep it in a log. 2) Take your medicines as prescribed 3) Eat low salt foods-Limit salt (sodium) to 2000 mg per day.  4) Stay as active as you can everyday 5) Limit all fluids for the day to less than 2 liters

## 2017-08-24 DIAGNOSIS — H349 Unspecified retinal vascular occlusion: Secondary | ICD-10-CM | POA: Diagnosis not present

## 2017-08-24 DIAGNOSIS — H5340 Unspecified visual field defects: Secondary | ICD-10-CM | POA: Diagnosis not present

## 2017-08-24 DIAGNOSIS — H348112 Central retinal vein occlusion, right eye, stable: Secondary | ICD-10-CM | POA: Diagnosis not present

## 2017-08-27 ENCOUNTER — Other Ambulatory Visit (HOSPITAL_COMMUNITY): Payer: Self-pay | Admitting: *Deleted

## 2017-08-27 MED ORDER — APIXABAN 5 MG PO TABS: 5.0000 mg | ORAL_TABLET | Freq: Two times a day (BID) | ORAL | 3 refills | Status: DC

## 2017-09-10 ENCOUNTER — Ambulatory Visit: Payer: Self-pay | Admitting: *Deleted

## 2017-09-10 ENCOUNTER — Encounter: Payer: Self-pay | Admitting: Physician Assistant

## 2017-09-10 ENCOUNTER — Ambulatory Visit (INDEPENDENT_AMBULATORY_CARE_PROVIDER_SITE_OTHER): Payer: Medicare Other

## 2017-09-10 ENCOUNTER — Encounter: Payer: Self-pay | Admitting: *Deleted

## 2017-09-10 ENCOUNTER — Ambulatory Visit (INDEPENDENT_AMBULATORY_CARE_PROVIDER_SITE_OTHER): Payer: Medicare Other | Admitting: Physician Assistant

## 2017-09-10 VITALS — BP 110/68 | HR 73 | Temp 98.3°F | Ht 66.0 in | Wt 155.5 lb

## 2017-09-10 DIAGNOSIS — L989 Disorder of the skin and subcutaneous tissue, unspecified: Secondary | ICD-10-CM | POA: Diagnosis not present

## 2017-09-10 DIAGNOSIS — M79671 Pain in right foot: Secondary | ICD-10-CM

## 2017-09-10 DIAGNOSIS — M19071 Primary osteoarthritis, right ankle and foot: Secondary | ICD-10-CM | POA: Diagnosis not present

## 2017-09-10 NOTE — Progress Notes (Signed)
[name] is a 84 y.o. male here for a new problem.  I acted as a Education administrator for [employer], PA-C [employer], LPN  History of Present Illness:   Chief Complaint  Patient presents with  . right foot laceration    Foot Injury    Incident onset: Pt has a laceration on top of his right foot happened a week ago, pt noticied today his toes are discolored and foot is swollen.  The incident occurred in the yard.  The injury mechanism is unknown.  The pain is present in the right foot. The quality of the pain is described as aching. The pain is at a severity of 4/10 (hurts to push on the foot and when he walks). The pain has been intermittent since onset. Associated symptoms include a loss of sensation (top of foot), numbness and tingling.  Associated symptoms comments: Pt has Neuropathy.  He reports no foreign bodies present.  The symptoms are aggravated by palpation and weight bearing.  He has tried acetaminophen for the symptoms. The treatment provided mild relief.   Currently on Eliquis 5 mg daily. Takes this religiously.   Tetanus was done in Dec 2015.  He also has a skin lesion to R posterior calf that has been there for a "4-5 weeks." Denies discharge, itchiness, change in size since it was first noticed.  Past Medical History:  Diagnosis Date  . Arthritis    "back" (03/20/2014)  . Atrial fibrillation ([place])   . BPH (benign prostatic hypertrophy)   . Breast mass    "on both sides" (06/28/2012)  . Cardiomyopathy primary-nonischemic EF 45%  . Carotid artery occlusion   . CHF (congestive heart failure) ([place])   . Coronary artery disease   . Diverticulosis of colon without hemorrhage 01/03/2014  . Dysphagia   . Elevated liver enzymes   . Esophageal stricture   . GERD (gastroesophageal reflux disease)   . Hearing loss, mixed, bilateral   . Hx of cardiovascular stress test    Adenosine Myoview (07/2013):  Apical cap, apical lateral and mid anterolateral scar, small amount of  peri-infarct ischemia, EF 36%; Medium Risk  . Hyperlipidemia   . Increased prostate specific antigen (PSA) velocity   . Internal hemorrhoids 01/03/2014  . Lumbar back pain   . Pacemaker    st jude  . Pacemaker infection ([place])    06/28/12  . Polymyalgia rheumatica ([place])   . Sick sinus syndrome [facility])      Social History   Socioeconomic History  . Marital status: Married    Spouse name: Not on file  . Number of children: 3  . Years of education: Not on file  . Highest education level: Not on file  Occupational History  . Occupation: retired    Fish farm manager: RETIRED    Comment: Engineering geologist  Social Needs  . Financial resource strain: Not on file  . Food insecurity:    Worry: Not on file    Inability: Not on file  . Transportation needs:    Medical: Not on file    Non-medical: Not on file  Tobacco Use  . Smoking status: Former Smoker    Packs/day: 1.00    Years: 40.00    Pack years: 40.00    Types: Cigarettes    Last attempt to quit: 03/10/1983    Years since quitting: 34.5  . Smokeless tobacco: Never Used  Substance and Sexual Activity  . Alcohol use: No    Alcohol/week: 0.0 oz  Comment: quit drinking 2000-? alcohol related cardiomyopathy  . Drug use: No  . Sexual activity: Not Currently  Lifestyle  . Physical activity:    Days per week: Not on file    Minutes per session: Not on file  . Stress: Not on file  Relationships  . Social connections:    Talks on phone: Not on file    Gets together: Not on file    Attends religious service: Not on file    Active member of club or organization: Not on file    Attends meetings of clubs or organizations: Not on file    Relationship status: Not on file  . Intimate partner violence:    Fear of current or ex partner: Not on file    Emotionally abused: Not on file    Physically abused: Not on file    Forced sexual activity: Not on file  Other Topics Concern  . Not on file  Social History Narrative   Married. 3  children (6 together). 5 grandkids with with 5 grandkids (2nd marriage). No greatgrandkids.    One story home.   Retired from Financial planner   Education: college   Hobbies: play golf, bridge, garden, write family stories    Past Surgical History:  Procedure Laterality Date  . AV NODE ABLATION N/A 04/20/2016   Procedure: AV Node Ablation;  Surgeon: Evans Lance, MD;  Location: [place] [facility];  Service: Cardiovascular;  Laterality: N/A;  . CARDIAC CATHETERIZATION  1980's   [place]  . CARDIOVERSION  06/17/2011   Procedure: CARDIOVERSION;  Surgeon: Lelon Perla, MD;  Location: [place];  Service: Cardiovascular;  Laterality: N/A;  . CARDIOVERSION  09/09/2011   Procedure: CARDIOVERSION;  Surgeon: Carlena Bjornstad, MD;  Location: [place];  Service: Cardiovascular;  Laterality: N/A;  . CARDIOVERSION  01/01/2012   Procedure: CARDIOVERSION;  Surgeon: Hillary Bow, MD;  Location: [facility] ENDOSCOPY;  Service: Cardiovascular;  Laterality: N/A;  . CARDIOVERSION N/A 08/03/2014   Procedure: CARDIOVERSION;  Surgeon: Larey Dresser, MD;  Location: [facility] ENDOSCOPY;  Service: Cardiovascular;  Laterality: N/A;  . CARDIOVERSION N/A 11/07/2015   Procedure: CARDIOVERSION;  Surgeon: Larey Dresser, MD;  Location: [facility] ENDOSCOPY;  Service: Cardiovascular;  Laterality: N/A;  . CARDIOVERSION N/A 11/20/2015   Procedure: CARDIOVERSION;  Surgeon: Larey Dresser, MD;  Location: [place];  Service: Cardiovascular;  Laterality: N/A;  . CAROTID ENDARTERECTOMY    . CATARACT EXTRACTION, BILATERAL Bilateral 1980's  . COLONOSCOPY N/A 01/03/2014   Procedure: COLONOSCOPY;  Surgeon: Gatha Mayer, MD;  Location: [facility] ENDOSCOPY;  Service: Endoscopy;  Laterality: N/A;  . ENDARTERECTOMY Right 07/26/2017   Procedure: RIGHT CAROTID  ARTERY ENDARTERECTOMY;  Surgeon: Rosetta Posner, MD;  Location: [facility] OR;  Service: Vascular;  Laterality: Right;  . ESOPHAGOGASTRODUODENOSCOPY    . GENERATOR REMOVAL Left 06/15/2012    Procedure: GENERATOR REMOVAL;  Surgeon: Evans Lance, MD;  Location: [place];  Service: Cardiovascular;  Laterality: Left;  . HEMORRHOID BANDING    . INGUINAL HERNIA REPAIR Right 2012; 03/20/2014  . INGUINAL HERNIA REPAIR Right 03/20/2014   Procedure: OPEN REPAIR OF RIGHT INGUINAL WITH MESH;  Surgeon: Donnie Mesa, MD;  Location: [place];  Service: General;  Laterality: Right;  . INSERT / REPLACE / REMOVE PACEMAKER    . INSERTION OF MESH Right 03/20/2014   Procedure: INSERTION OF MESH;  Surgeon: Donnie Mesa, MD;  Location: [place];  Service: General;  Laterality: Right;  . KNEE ARTHROSCOPY  WITH LATERAL MENISECTOMY Left 12/16/2016   Procedure: KNEE ARTHROSCOPY WITH LATERAL MENISECTOMY and Chondroplasty;  Surgeon: Earlie Server, MD;  Location: [place];  Service: Orthopedics;  Laterality: Left;  . LEAD REVISION N/A 06/29/2012   Procedure: LEAD REVISION;  Surgeon: Deboraha Sprang, MD;  Location: [facility] CATH LAB;  Service: Cardiovascular;  Laterality: N/A;  . PACEMAKER GENERATOR CHANGE N/A 06/15/2012   Procedure: PACEMAKER GENERATOR CHANGE;  Surgeon: Evans Lance, MD;  Location: [place];  Service: Cardiovascular;  Laterality: N/A;  . PACEMAKER REVISION  06/30/2012   Procedure: PACEMAKER LEAD REVISION;  Surgeon: Evans Lance, MD;  Location: [facility] CATH LAB;  Service: Cardiovascular;;  . PARACENTESIS  ~ 03/6107   complication from pacer change in 2014  . PATCH ANGIOPLASTY Right 07/26/2017   Procedure: WITH PATCH ANGIOPLASTY;  Surgeon: Rosetta Posner, MD;  Location: [place];  Service: Vascular;  Laterality: Right;  . PERMANENT PACEMAKER INSERTION N/A 06/28/2012   Procedure: PERMANENT PACEMAKER INSERTION;  Surgeon: Evans Lance, MD;  Location: [facility] CATH LAB;  Service: Cardiovascular;  Laterality: N/A;  . TEE WITHOUT CARDIOVERSION  01/01/2012   Procedure: TRANSESOPHAGEAL ECHOCARDIOGRAM (TEE);  Surgeon: Fay Records, MD;  Location: [facility] ENDOSCOPY;  Service: Cardiovascular;  Laterality: N/A;  . TEE WITHOUT CARDIOVERSION N/A  08/03/2014   Procedure: TRANSESOPHAGEAL ECHOCARDIOGRAM (TEE);  Surgeon: Larey Dresser, MD;  Location: [place];  Service: Cardiovascular;  Laterality: N/A;  . TEE WITHOUT CARDIOVERSION N/A 11/07/2015   Procedure: TRANSESOPHAGEAL ECHOCARDIOGRAM (TEE);  Surgeon: Larey Dresser, MD;  Location: [place];  Service: Cardiovascular;  Laterality: N/A;  . TONSILLECTOMY AND ADENOIDECTOMY  1938  . UMBILICAL HERNIA REPAIR  [phone] 3    Family History  Problem Relation Age of Onset  . Heart attack Father 48       deceased  . AAA (abdominal aortic aneurysm) Mother        deceased AAA  . Hypertension Mother   . Other Brother        deceased at birth  . Healthy Son   . Healthy Daughter   . Diabetes Cousin     Allergies  Allergen Reactions  . Antihistamines, Diphenhydramine-Type Other (See Comments)    Causes difficulty in ability to urinate.    Current Medications:   Current Outpatient Medications:  .  acetaminophen (TYLENOL) 500 MG tablet, Take 1,000 mg by mouth every 6 (six) hours as needed for moderate pain or headache., Disp: , Rfl:  .  apixaban (ELIQUIS) 5 MG TABS tablet, Take 1 tablet (5 mg total) by mouth 2 (two) times daily., Disp: 180 tablet, Rfl: 3 .  aspirin EC 81 MG tablet, Take 1 tablet (81 mg total) by mouth daily., Disp: 150 tablet, Rfl: 2 .  fluticasone (FLONASE) 50 MCG/ACT nasal spray, Place 2 sprays into both nostrils daily as needed for allergies or rhinitis., Disp: 16 g, Rfl: 3 .  furosemide (LASIX) 80 MG tablet, Take 80 mg by mouth daily., Disp: , Rfl:  .  hydroxypropyl methylcellulose / hypromellose (ISOPTO TEARS / GONIOVISC) 2.5 % ophthalmic solution, Place 1 drop into both eyes daily as needed for dry eyes., Disp: , Rfl:  .  KLOR-CON M20 20 MEQ tablet, TAKE 1 TABLET TWICE DAILY, Disp: 180 tablet, Rfl: 2 .  losartan (COZAAR) 25 MG tablet, Take 12.5 mg by mouth daily., Disp: , Rfl:  .  metoprolol succinate (TOPROL-XL) 25 MG 24 hr tablet, Take 0.5 tablets  (12.5 mg total) by mouth daily. Please call for  office visit [phone], Disp: 45 tablet, Rfl: 0 .  pravastatin (PRAVACHOL) 80 MG tablet, TAKE 1/2 TABLET (40 MG TOTAL) DAILY, Disp: 45 tablet, Rfl: 3 .  predniSONE (DELTASONE) 1 MG tablet, Take 1-2 tablets (1-2 mg total) by mouth daily with breakfast. (Patient taking differently: Take 1 mg by mouth daily with breakfast. ), Disp: 60 tablet, Rfl: 1 .  spironolactone (ALDACTONE) 25 MG tablet, Take 12.5 mg by mouth daily., Disp: , Rfl:  .  tamsulosin (FLOMAX) 0.4 MG CAPS capsule, TAKE 1 CAPSULE EVERY DAY, Disp: 90 capsule, Rfl: 3   Review of Systems:   Review of Systems  Neurological: Positive for tingling and numbness.    Vitals:   Vitals:   09/10/17 1309  BP: 110/68  Pulse: 73  Temp: 98.3 F (36.8 C)  TempSrc: Oral  SpO2: 93%  Weight: 155 lb 8 oz (70.5 kg)  Height: 5\' 6"  (1.676 m)     Body mass index is 25.1 kg/m.  Physical Exam:   Physical Exam  Constitutional: He appears well-developed. He is cooperative.  Non-toxic appearance. He does not have a sickly appearance. He does not appear ill. No distress.  Cardiovascular: Normal rate, S1 normal, S2 normal, normal heart sounds and normal pulses. An irregularly irregular rhythm present.  Pulmonary/Chest: Effort normal and breath sounds normal.  Neurological: He is alert. GCS eye subscore is 4. GCS verbal subscore is 5. GCS motor subscore is 6.  Decreased sensation to bilateral toes and soles of feet  Skin: Skin is warm, dry and intact.  See attached images. R foot is warm, scant bloody drainage present. 1+ edema. No pus, no odor.  Lesion to R posterior calf -- see image.  Psychiatric: He has a normal mood and affect. His speech is normal and behavior is normal.  Nursing note and vitals reviewed.        R foot xray -- pending, initial read without acute xray  Assessment and Plan:    Kamuela was seen today for right foot laceration.  Diagnoses and all orders for this  visit:  Right foot pain Dr. Briscoe Deutscher also in to evaluate patient. Suspect increased swelling from trauma and dependent use of leg. Vitals normal. X-ray obtained -- awaiting official radiology read, however do not see any acute fracture. Area was lightly wrapped in office today and patient was told to keep it elevated. If any fever, pus, or other concerns over weekend, he was instructed to seek medical attention. Patient verbalized understanding. -     DG Foot Complete Right; Future  Skin lesion Area needs biopsy -- he has a scheduled appointment with Dr. Yong Channel next week, will defer to him for further management.  . Reviewed expectations re: course of current medical issues. . Discussed self-management of symptoms. . Outlined signs and symptoms indicating need for more acute intervention. . Patient verbalized understanding and all questions were answered. . See orders for this visit as documented in the electronic medical record. . Patient received an After-Visit Summary.  CMA or LPN served as scribe during this visit. History, Physical, and Plan performed by medical provider. Documentation and orders reviewed and attested to.  Inda Coke, PA-C

## 2017-09-10 NOTE — Telephone Encounter (Signed)
See note

## 2017-09-10 NOTE — Patient Instructions (Addendum)
It was great to see you.  Keep your foot elevated. Please follow-up with Dr. Yong Channel next week so he can recheck your foot and evaluate the lesion on the back of your leg.  If you develop any worsening swelling, pain or fever over the weekend, seek medical attention.

## 2017-09-10 NOTE — Telephone Encounter (Signed)
Pt states he had a small cut to the top of right foot approximately 3-4 days ago while gardening outside. Pt states that the cut is sore but not currently bleeding and does not seem to be healing well.Pt states he was using neosporin to the area of the cut. Pt states that he went to the beach a couple of days ago and his foot seemed to be doing better but noticed on yesterday that his foot was more swollen and his toes were turning black. Pt states that i every toe on the right foot is black except the big toe and can't really feel anything in the toes due to a history of neuropathy. PCP not available for appt today. Spoke with Roselyn Reef in the office to confirm the pt was ok to come in with the symptoms voiced above. Roselyn Reef confirms that the pt can come in and be scheduled with S. Worley,PA. Pt scheduled for appt at 1 pm today.   Reason for Disposition . [1] Looks infected (spreading redness, pus) AND [2] no fever  Answer Assessment - Initial Assessment Questions 1. APPEARANCE of INJURY: "What does the injury look like?"      Red where cut is but not entire foot 2. SIZE: "How large is the cut?"      Small cut unable to give an exact length 3. BLEEDING: "Is it bleeding now?" If so, ask: "Is it difficult to stop?"      No it is not bleeding at this time 4. LOCATION: "Where is the injury located?"      Top of the right foot 5. ONSET: "How long ago did the injury occur?"     3-4 days ago 6. MECHANISM: "Tell me how it happened."      Hit it while gardening but not sure on what 7. TETANUS: "When was the last tetanus booster?"     Unsure  Protocols used: [place]

## 2017-09-10 NOTE — Telephone Encounter (Signed)
Noted  

## 2017-09-14 ENCOUNTER — Encounter: Payer: Self-pay | Admitting: Family Medicine

## 2017-09-14 ENCOUNTER — Ambulatory Visit (INDEPENDENT_AMBULATORY_CARE_PROVIDER_SITE_OTHER): Payer: Medicare Other | Admitting: Family Medicine

## 2017-09-14 ENCOUNTER — Ambulatory Visit: Payer: Medicare Other | Admitting: Family Medicine

## 2017-09-14 VITALS — BP 102/70 | HR 71 | Temp 97.9°F | Ht 66.0 in | Wt 153.4 lb

## 2017-09-14 DIAGNOSIS — I5022 Chronic systolic (congestive) heart failure: Secondary | ICD-10-CM

## 2017-09-14 DIAGNOSIS — E785 Hyperlipidemia, unspecified: Secondary | ICD-10-CM | POA: Diagnosis not present

## 2017-09-14 DIAGNOSIS — I482 Chronic atrial fibrillation, unspecified: Secondary | ICD-10-CM

## 2017-09-14 DIAGNOSIS — I6529 Occlusion and stenosis of unspecified carotid artery: Secondary | ICD-10-CM

## 2017-09-14 DIAGNOSIS — M353 Polymyalgia rheumatica: Secondary | ICD-10-CM | POA: Diagnosis not present

## 2017-09-14 NOTE — Assessment & Plan Note (Signed)
S:  controlled on pravastatin with LDL direct on 06/15/17 at 69. LDL was 72 when not fasting in april  A/P: continue current rx

## 2017-09-14 NOTE — Assessment & Plan Note (Addendum)
S: He is on 1 mg every other day right now and tomorrow is last dose. Hasnt had return of shoulder or pelvic girdle stiffness.  A/P: doing well- glad he is about to come off the prednisone and symptoms remain in remission.   His labs certainly dont line up with PMR but symptoms and response to prednisone cretainly do.

## 2017-09-14 NOTE — Progress Notes (Signed)
Subjective:  [name] is a 84 y.o. year old very pleasant male patient who presents for/with See problem oriented charting ROS- mild shortness of breath after seated for a bit- if already up and moving does not describe shortness of breath. No chest pain.  No headache. Slight blurry vision- sees eye doctor in next few weeks.   Past Medical History-  Patient Active Problem List   Diagnosis Date Noted  . History of central retinal artery occlusion 08/04/2017    Priority: High  . Stenosis of right internal carotid artery s/p endarterectomy 07/2017     Priority: High  . Chronic systolic CHF (congestive heart failure) ([place]) 03/21/2013    Priority: High  . Nonischemic cardiomyopathy ([place]) 09/20/2010    Priority: High  . PPM-St.Jude 11/27/2008    Priority: High  . CAD (coronary artery disease) 07/20/2008    Priority: High  . SICK SINUS SYNDROME 07/20/2008    Priority: High  . Polymyalgia rheumatica ([place]) 07/03/2008    Priority: High  . Atrial fibrillation ([place]) 03/16/2007    Priority: High  . AK (actinic keratosis) 01/28/2016    Priority: Medium  . Lipoma 07/08/2015    Priority: Medium  . Neuropathy 05/16/2015    Priority: Medium  . Chronic cough 08/18/2013    Priority: Medium  . Pleural effusion, right 08/16/2013    Priority: Medium  . Pleural effusion on right 07/06/2012    Priority: Medium  . Hyperlipidemia 03/16/2007    Priority: Medium  . BPH (benign prostatic hyperplasia) 03/16/2007    Priority: Medium  . Thrombus of left atrial appendage without antecedent myocardial infarction 07/24/2017    Priority: Low  . Traumatic hematoma 04/01/2017    Priority: Low  . Left knee pain 10/23/2016    Priority: Low  . Prolapsed internal hemorrhoids, grade 2 - causing fecal seepage 06/10/2016    Priority: Low  . Chronic anticoagulation 02/10/2016    Priority: Low  . Left breast lump 07/15/2015    Priority: Low  . Trigger thumb of right hand 07/04/2014    Priority: Low   . Encounter for therapeutic drug monitoring 07/02/2014    Priority: Low  . Allergic rhinitis 02/14/2014    Priority: Low  . Fecal incontinence 10/18/2013    Priority: Low  . Recurrent right inguinal hernia 11/22/2012    Priority: Low  . Intention tremor 11/01/2012    Priority: Low  . MIXED HEARING LOSS BILATERAL 01/10/2008    Priority: Low  . GERD 03/16/2007    Priority: Low    Medications- reviewed and updated Current Outpatient Medications  Medication Sig Dispense Refill  . acetaminophen (TYLENOL) 500 MG tablet Take 1,000 mg by mouth every 6 (six) hours as needed for moderate pain or headache.    Marland Kitchen apixaban (ELIQUIS) 5 MG TABS tablet Take 1 tablet (5 mg total) by mouth 2 (two) times daily. 180 tablet 3  . aspirin EC 81 MG tablet Take 1 tablet (81 mg total) by mouth daily. 150 tablet 2  . fluticasone (FLONASE) 50 MCG/ACT nasal spray Place 2 sprays into both nostrils daily as needed for allergies or rhinitis. 16 g 3  . furosemide (LASIX) 80 MG tablet Take 80 mg by mouth daily.    . hydroxypropyl methylcellulose / hypromellose (ISOPTO TEARS / GONIOVISC) 2.5 % ophthalmic solution Place 1 drop into both eyes daily as needed for dry eyes.    Marland Kitchen KLOR-CON M20 20 MEQ tablet TAKE 1 TABLET TWICE DAILY 180 tablet 2  .  losartan (COZAAR) 25 MG tablet Take 12.5 mg by mouth daily.    . metoprolol succinate (TOPROL-XL) 25 MG 24 hr tablet Take 0.5 tablets (12.5 mg total) by mouth daily. Please call for office visit [phone] 45 tablet 0  . pravastatin (PRAVACHOL) 80 MG tablet TAKE 1/2 TABLET (40 MG TOTAL) DAILY 45 tablet 3  . predniSONE (DELTASONE) 1 MG tablet Take 1-2 tablets (1-2 mg total) by mouth daily with breakfast. (Patient taking differently: Take 1 mg by mouth daily with breakfast. ) 60 tablet 1  . spironolactone (ALDACTONE) 25 MG tablet Take 12.5 mg by mouth daily.    . tamsulosin (FLOMAX) 0.4 MG CAPS capsule TAKE 1 CAPSULE EVERY DAY 90 capsule 3   No current facility-administered  medications for this visit.     Objective: BP 102/70 (BP Location: Left Arm, Patient Position: Sitting, Cuff Size: Normal)   Pulse 71   Temp 97.9 F (36.6 C) (Oral)   Ht 5\' 6"  (1.676 m)   Wt 153 lb 6.4 oz (69.6 kg)   SpO2 95%   BMI 24.76 kg/m  Gen: NAD, resting comfortably CV: RRR with occasional ectopic beats no murmurs rubs or gallops Lungs: CTAB no crackles, wheeze, rhonchi Abdomen: soft/nontender/nondistended/normal bowel sounds. No rebound or guarding.  Ext: no edema Skin: warm, dry, right foot- small healing abrasion with mild edema around it. On back of right calf has about 1 cm scab- when removed- slight divot in leg with minimal bleeding (patient reports there for 4 months and slowly getting smaller)  Assessment/Plan:  Other notes: 1.wound on right foot healing- has some bruising on toes. Thinks  May have Dropped something on foot. Slowly getting better. No warmth or increasing pain.  2. Lesion on back of right calf- reports present 4-5 months. Slowly healing/getting smaller. Does not remember how he injured it. We removed scab today. Some light bleeding noted. No deep ulceration. Since healing we discussed monitoring- if enlarges or fails to heal by follow up then would refer to dermatology  Polymyalgia rheumatica ([place]) S: He is on 1 mg every other day right now and tomorrow is last dose. Hasnt had return of shoulder or pelvic girdle stiffness.  A/P: doing well- glad he is about to come off the prednisone and symptoms remain in remission.   His labs certainly dont line up with PMR but symptoms and response to prednisone cretainly do.   Chronic systolic CHF (congestive heart failure) ([facility]) S: patient remains on aldactone 12.5mg , metoprolol 12.5mg  XR, losartan 12.5mg , lasix 80mg  daily. Weight largely stable. No edema A/P: CHF remains well controlled- continue current rx   Atrial fibrillation ([place]) S: patient remains unpleased with eliquis cost of $2400 per year.  Compliant with metoprolol 12.5mg  XR daily A/P: appears rate controlled. Continue current rx.   Hyperlipidemia S:  controlled on pravastatin with LDL direct on 06/15/17 at 69. LDL was 72 when not fasting in april  A/P: continue current rx   We discussed 6 month follow up verbally. Just had labs within 2 months- likely repeat labs at 45months  Future Appointments  Date Time Provider [place]  12/03/2017 10:40 AM Larey Dresser, MD [facility] None   Return precautions advised.  Garret Reddish, MD

## 2017-09-14 NOTE — Assessment & Plan Note (Signed)
S: patient remains on aldactone 12.5mg , metoprolol 12.5mg  XR, losartan 12.5mg , lasix 80mg  daily. Weight largely stable. No edema A/P: CHF remains well controlled- continue current rx

## 2017-09-14 NOTE — Assessment & Plan Note (Signed)
S: patient remains unpleased with eliquis cost of $2400 per year. Compliant with metoprolol 12.5mg  XR daily A/P: appears rate controlled. Continue current rx.

## 2017-09-14 NOTE — Patient Instructions (Addendum)
I would also like for you to sign up for an annual wellness visit with one of our nurses, Cassie or Manuela Schwartz, who both specialize in the annual wellness visit. This is a free benefit under medicare that may help [place] find additional ways to help you. Some highlights are reviewing medications, lifestyle, and doing a dementia screen.  Please check with your pharmacy to see if they have the shingrix vaccine. If they do- please get this immunization and update [place] by phone call or mychart with dates you receive the vaccine _________________________________________________________ No changes today  As long as wounds keep slowly improving on right foot and right calf ok to monitor- if they get worse/bigger see [place] back. For one on back of leg could consider dermatology but hold off for now since going in right direction

## 2017-09-29 ENCOUNTER — Telehealth (HOSPITAL_COMMUNITY): Payer: Self-pay | Admitting: *Deleted

## 2017-09-29 DIAGNOSIS — M7061 Trochanteric bursitis, right hip: Secondary | ICD-10-CM | POA: Diagnosis not present

## 2017-09-29 NOTE — Telephone Encounter (Signed)
Pt left VM requesting a call back. Called patient no answer left VM.

## 2017-10-06 DIAGNOSIS — M7061 Trochanteric bursitis, right hip: Secondary | ICD-10-CM | POA: Diagnosis not present

## 2017-10-07 ENCOUNTER — Other Ambulatory Visit (HOSPITAL_COMMUNITY): Payer: Self-pay | Admitting: *Deleted

## 2017-10-07 MED ORDER — POTASSIUM CHLORIDE CRYS ER 20 MEQ PO TBCR: 20.0000 meq | EXTENDED_RELEASE_TABLET | Freq: Two times a day (BID) | ORAL | 2 refills | Status: DC

## 2017-10-12 DIAGNOSIS — M7061 Trochanteric bursitis, right hip: Secondary | ICD-10-CM | POA: Diagnosis not present

## 2017-10-14 DIAGNOSIS — M7061 Trochanteric bursitis, right hip: Secondary | ICD-10-CM | POA: Diagnosis not present

## 2017-10-19 DIAGNOSIS — R3912 Poor urinary stream: Secondary | ICD-10-CM | POA: Diagnosis not present

## 2017-10-19 DIAGNOSIS — N401 Enlarged prostate with lower urinary tract symptoms: Secondary | ICD-10-CM | POA: Diagnosis not present

## 2017-10-22 DIAGNOSIS — M7061 Trochanteric bursitis, right hip: Secondary | ICD-10-CM | POA: Diagnosis not present

## 2017-10-29 DIAGNOSIS — M7061 Trochanteric bursitis, right hip: Secondary | ICD-10-CM | POA: Diagnosis not present

## 2017-11-02 DIAGNOSIS — N410 Acute prostatitis: Secondary | ICD-10-CM | POA: Diagnosis not present

## 2017-11-07 ENCOUNTER — Encounter (HOSPITAL_COMMUNITY): Payer: Self-pay

## 2017-11-07 ENCOUNTER — Emergency Department (HOSPITAL_COMMUNITY)
Admission: EM | Admit: 2017-11-07 | Discharge: 2017-11-07 | Disposition: A | Discharge status: Home / Self Care | Payer: Medicare Other | Attending: Emergency Medicine | Admitting: Emergency Medicine

## 2017-11-07 ENCOUNTER — Other Ambulatory Visit: Payer: Self-pay

## 2017-11-07 DIAGNOSIS — I2510 Atherosclerotic heart disease of native coronary artery without angina pectoris: Secondary | ICD-10-CM | POA: Diagnosis not present

## 2017-11-07 DIAGNOSIS — R339 Retention of urine, unspecified: Secondary | ICD-10-CM | POA: Insufficient documentation

## 2017-11-07 DIAGNOSIS — Z7901 Long term (current) use of anticoagulants: Secondary | ICD-10-CM | POA: Diagnosis not present

## 2017-11-07 DIAGNOSIS — I5022 Chronic systolic (congestive) heart failure: Secondary | ICD-10-CM | POA: Diagnosis not present

## 2017-11-07 DIAGNOSIS — Z950 Presence of cardiac pacemaker: Secondary | ICD-10-CM | POA: Diagnosis not present

## 2017-11-07 DIAGNOSIS — Z7982 Long term (current) use of aspirin: Secondary | ICD-10-CM | POA: Insufficient documentation

## 2017-11-07 DIAGNOSIS — Z87891 Personal history of nicotine dependence: Secondary | ICD-10-CM | POA: Diagnosis not present

## 2017-11-07 LAB — URINALYSIS, ROUTINE W REFLEX MICROSCOPIC
Bacteria, UA: NONE SEEN
Bilirubin Urine: NEGATIVE
Glucose, UA: NEGATIVE mg/dL
Ketones, ur: NEGATIVE mg/dL
Nitrite: NEGATIVE
PROTEIN: NEGATIVE mg/dL
SPECIFIC GRAVITY, URINE: 1.005 (ref 1.005–1.030)
pH: 5 (ref 5.0–8.0)

## 2017-11-07 NOTE — Discharge Instructions (Addendum)
Please leave the catheter in until you can see your urologist. Be sure to call and make an appointment with them soon. Continue to take your antibiotics and other medication as prescribed.  I have included some information on how to take care of your catheter when you leave the emergency room.   Thank you for allowing [place] to take care of you today!

## 2017-11-07 NOTE — ED Provider Notes (Signed)
  Face-to-face evaluation   History: Patient here for urinary retention that started today.  He is currently being treated for prostatitis with Cipro.  He is already on medications for prostatic hypertrophy.  He denies fever, vomiting or dizziness.  Physical exam: Elderly, alert, nontoxic.  Abdomen soft nontender without palpable mass, after urinary bladder catheterization by nursing.  Medical screening examination/treatment/procedure(s) were conducted as a shared visit with non-physician practitioner(s) and myself.  I personally evaluated the patient during the encounter    Daleen Bo, MD 11/08/17 [phone]

## 2017-11-07 NOTE — ED Provider Notes (Signed)
[place] DEPT Provider Note  CSN: [id number] Arrival date & time: 11/07/17  1703    History   Chief Complaint Chief Complaint  Patient presents with  . Urinary Retention    HPI [name] is a 84 y.o. male with a complex medical history including a-fib, CHF, CAD, BPH, GERD who presented to the ED for urinary retention x1 day. He reports that his last normal urine was yesterday afternoon. Patient states he was able to make a little urine in the ED, but that it was not a lot. Associated symptoms: lower abdomen discomfort, distention and penile pain. Denies fever, chills, N/V, change in bowel movements, blood in stool or changes in appetite.  Patient states that he is currently being treated for prostatitis by his urologist at [facility]. He states he has been complaint with dustasteride and Cipro since he started it last week. Patient has approx. 7-8 more days left of antibiotic treatment for the prostatitis.  Past Medical History:  Diagnosis Date  . Arthritis    "back" (03/20/2014)  . Atrial fibrillation ([place])   . BPH (benign prostatic hypertrophy)   . Breast mass    "on both sides" (06/28/2012)  . Cardiomyopathy primary-nonischemic EF 45%  . Carotid artery occlusion   . CHF (congestive heart failure) ([place])   . Coronary artery disease   . Diverticulosis of colon without hemorrhage 01/03/2014  . Dysphagia   . Elevated liver enzymes   . Esophageal stricture   . GERD (gastroesophageal reflux disease)   . Hearing loss, mixed, bilateral   . Hx of cardiovascular stress test    Adenosine Myoview (07/2013):  Apical cap, apical lateral and mid anterolateral scar, small amount of peri-infarct ischemia, EF 36%; Medium Risk  . Hyperlipidemia   . Increased prostate specific antigen (PSA) velocity   . Internal hemorrhoids 01/03/2014  . Lumbar back pain   . Pacemaker    st jude  . Pacemaker infection ([place])    06/28/12  . Polymyalgia rheumatica  ([place])   . Sick sinus syndrome [facility])     Patient Active Problem List   Diagnosis Date Noted  . History of central retinal artery occlusion 08/04/2017  . Stenosis of right internal carotid artery s/p endarterectomy 07/2017   . Thrombus of left atrial appendage without antecedent myocardial infarction 07/24/2017  . Traumatic hematoma 04/01/2017  . Left knee pain 10/23/2016  . Prolapsed internal hemorrhoids, grade 2 - causing fecal seepage 06/10/2016  . Chronic anticoagulation 02/10/2016  . AK (actinic keratosis) 01/28/2016  . Left breast lump 07/15/2015  . Lipoma 07/08/2015  . Neuropathy 05/16/2015  . Trigger thumb of right hand 07/04/2014  . Encounter for therapeutic drug monitoring 07/02/2014  . Allergic rhinitis 02/14/2014  . Fecal incontinence 10/18/2013  . Chronic cough 08/18/2013  . Pleural effusion, right 08/16/2013  . Chronic systolic CHF (congestive heart failure) ([place]) 03/21/2013  . Recurrent right inguinal hernia 11/22/2012  . Intention tremor 11/01/2012  . Pleural effusion on right 07/06/2012  . Nonischemic cardiomyopathy ([place]) 09/20/2010  . PPM-St.Jude 11/27/2008  . CAD (coronary artery disease) 07/20/2008  . SICK SINUS SYNDROME 07/20/2008  . Polymyalgia rheumatica ([place]) 07/03/2008  . MIXED HEARING LOSS BILATERAL 01/10/2008  . Hyperlipidemia 03/16/2007  . Atrial fibrillation ([place]) 03/16/2007  . GERD 03/16/2007  . BPH (benign prostatic hyperplasia) 03/16/2007    Past Surgical History:  Procedure Laterality Date  . AV NODE ABLATION N/A 04/20/2016   Procedure: AV Node Ablation;  Surgeon: Evans Lance,  MD;  Location: [place] CV LAB;  Service: Cardiovascular;  Laterality: N/A;  . CARDIAC CATHETERIZATION  1980's   [place]  . CARDIOVERSION  06/17/2011   Procedure: CARDIOVERSION;  Surgeon: Lelon Perla, MD;  Location: [place];  Service: Cardiovascular;  Laterality: N/A;  . CARDIOVERSION  09/09/2011   Procedure: CARDIOVERSION;  Surgeon: Carlena Bjornstad, MD;   Location: [place];  Service: Cardiovascular;  Laterality: N/A;  . CARDIOVERSION  01/01/2012   Procedure: CARDIOVERSION;  Surgeon: Hillary Bow, MD;  Location: [facility] ENDOSCOPY;  Service: Cardiovascular;  Laterality: N/A;  . CARDIOVERSION N/A 08/03/2014   Procedure: CARDIOVERSION;  Surgeon: Larey Dresser, MD;  Location: [facility] ENDOSCOPY;  Service: Cardiovascular;  Laterality: N/A;  . CARDIOVERSION N/A 11/07/2015   Procedure: CARDIOVERSION;  Surgeon: Larey Dresser, MD;  Location: [facility] ENDOSCOPY;  Service: Cardiovascular;  Laterality: N/A;  . CARDIOVERSION N/A 11/20/2015   Procedure: CARDIOVERSION;  Surgeon: Larey Dresser, MD;  Location: [place];  Service: Cardiovascular;  Laterality: N/A;  . CAROTID ENDARTERECTOMY    . CATARACT EXTRACTION, BILATERAL Bilateral 1980's  . COLONOSCOPY N/A 01/03/2014   Procedure: COLONOSCOPY;  Surgeon: Gatha Mayer, MD;  Location: [facility] ENDOSCOPY;  Service: Endoscopy;  Laterality: N/A;  . ENDARTERECTOMY Right 07/26/2017   Procedure: RIGHT CAROTID  ARTERY ENDARTERECTOMY;  Surgeon: Rosetta Posner, MD;  Location: [facility] OR;  Service: Vascular;  Laterality: Right;  . ESOPHAGOGASTRODUODENOSCOPY    . GENERATOR REMOVAL Left 06/15/2012   Procedure: GENERATOR REMOVAL;  Surgeon: Evans Lance, MD;  Location: [place];  Service: Cardiovascular;  Laterality: Left;  . HEMORRHOID BANDING    . INGUINAL HERNIA REPAIR Right 2012; 03/20/2014  . INGUINAL HERNIA REPAIR Right 03/20/2014   Procedure: OPEN REPAIR OF RIGHT INGUINAL WITH MESH;  Surgeon: Donnie Mesa, MD;  Location: [place];  Service: General;  Laterality: Right;  . INSERT / REPLACE / REMOVE PACEMAKER    . INSERTION OF MESH Right 03/20/2014   Procedure: INSERTION OF MESH;  Surgeon: Donnie Mesa, MD;  Location: [place];  Service: General;  Laterality: Right;  . KNEE ARTHROSCOPY WITH LATERAL MENISECTOMY Left 12/16/2016   Procedure: KNEE ARTHROSCOPY WITH LATERAL MENISECTOMY and Chondroplasty;  Surgeon: Earlie Server, MD;  Location: [place];   Service: Orthopedics;  Laterality: Left;  . LEAD REVISION N/A 06/29/2012   Procedure: LEAD REVISION;  Surgeon: Deboraha Sprang, MD;  Location: [facility] CATH LAB;  Service: Cardiovascular;  Laterality: N/A;  . PACEMAKER GENERATOR CHANGE N/A 06/15/2012   Procedure: PACEMAKER GENERATOR CHANGE;  Surgeon: Evans Lance, MD;  Location: [place];  Service: Cardiovascular;  Laterality: N/A;  . PACEMAKER REVISION  06/30/2012   Procedure: PACEMAKER LEAD REVISION;  Surgeon: Evans Lance, MD;  Location: [facility] CATH LAB;  Service: Cardiovascular;;  . PARACENTESIS  ~ 08/3014   complication from pacer change in 2014  . PATCH ANGIOPLASTY Right 07/26/2017   Procedure: WITH PATCH ANGIOPLASTY;  Surgeon: Rosetta Posner, MD;  Location: [place];  Service: Vascular;  Laterality: Right;  . PERMANENT PACEMAKER INSERTION N/A 06/28/2012   Procedure: PERMANENT PACEMAKER INSERTION;  Surgeon: Evans Lance, MD;  Location: [facility] CATH LAB;  Service: Cardiovascular;  Laterality: N/A;  . TEE WITHOUT CARDIOVERSION  01/01/2012   Procedure: TRANSESOPHAGEAL ECHOCARDIOGRAM (TEE);  Surgeon: Fay Records, MD;  Location: [facility] ENDOSCOPY;  Service: Cardiovascular;  Laterality: N/A;  . TEE WITHOUT CARDIOVERSION N/A 08/03/2014   Procedure: TRANSESOPHAGEAL ECHOCARDIOGRAM (TEE);  Surgeon: Larey Dresser, MD;  Location: [place];  Service:  Cardiovascular;  Laterality: N/A;  . TEE WITHOUT CARDIOVERSION N/A 11/07/2015   Procedure: TRANSESOPHAGEAL ECHOCARDIOGRAM (TEE);  Surgeon: Larey Dresser, MD;  Location: [place];  Service: Cardiovascular;  Laterality: N/A;  . TONSILLECTOMY AND ADENOIDECTOMY  1938  . UMBILICAL HERNIA REPAIR  [phone] 3        Home Medications    Prior to Admission medications   Medication Sig Start Date End Date Taking? Authorizing Provider  acetaminophen (TYLENOL) 500 MG tablet Take 1,000 mg by mouth every 6 (six) hours as needed for moderate pain or headache.    [provider]  apixaban (ELIQUIS) 5 MG TABS tablet  Take 1 tablet (5 mg total) by mouth 2 (two) times daily. 08/27/17   Larey Dresser, MD  aspirin EC 81 MG tablet Take 1 tablet (81 mg total) by mouth daily. 07/27/17 10/20/18  Dagoberto Ligas, PA-C  fluticasone (FLONASE) 50 MCG/ACT nasal spray Place 2 sprays into both nostrils daily as needed for allergies or rhinitis. 07/02/17   [name], MD  furosemide (LASIX) 80 MG tablet Take 80 mg by mouth daily.    [provider]  hydroxypropyl methylcellulose / hypromellose (ISOPTO TEARS / GONIOVISC) 2.5 % ophthalmic solution Place 1 drop into both eyes daily as needed for dry eyes.    [provider]  losartan (COZAAR) 25 MG tablet Take 12.5 mg by mouth daily.    [provider]  metoprolol succinate (TOPROL-XL) 25 MG 24 hr tablet Take 0.5 tablets (12.5 mg total) by mouth daily. Please call for office visit [phone] 06/29/17   Larey Dresser, MD  potassium chloride SA (KLOR-CON M20) 20 MEQ tablet Take 1 tablet (20 mEq total) by mouth 2 (two) times daily. 10/07/17   Larey Dresser, MD  pravastatin (PRAVACHOL) 80 MG tablet TAKE 1/2 TABLET (40 MG TOTAL) DAILY 06/28/17   [name], MD  predniSONE (DELTASONE) 1 MG tablet Take 1-2 tablets (1-2 mg total) by mouth daily with breakfast. Patient taking differently: Take 1 mg by mouth daily with breakfast.  06/22/17   [name], MD  spironolactone (ALDACTONE) 25 MG tablet Take 12.5 mg by mouth daily.    [provider]  tamsulosin (FLOMAX) 0.4 MG CAPS capsule TAKE 1 CAPSULE EVERY DAY 06/28/17   [name], MD    Family History Family History  Problem Relation Age of Onset  . Heart attack Father 78       deceased  . AAA (abdominal aortic aneurysm) Mother        deceased AAA  . Hypertension Mother   . Other Brother        deceased at birth  . Healthy Son   . Healthy Daughter   . Diabetes Cousin     Social History Social History   Tobacco Use  . Smoking status: Former Smoker     Packs/day: 1.00    Years: 40.00    Pack years: 40.00    Types: Cigarettes    Last attempt to quit: 03/10/1983    Years since quitting: 34.6  . Smokeless tobacco: Never Used  Substance Use Topics  . Alcohol use: No    Alcohol/week: 0.0 standard drinks    Comment: quit drinking 2000-? alcohol related cardiomyopathy  . Drug use: No     Allergies   Antihistamines, diphenhydramine-type   Review of Systems Review of Systems  Constitutional: Negative for appetite change, chills and fever.  Gastrointestinal: Positive for abdominal pain. Negative for constipation,  diarrhea, nausea and vomiting.  Genitourinary: Positive for decreased urine volume, difficulty urinating and penile pain. Negative for discharge, dysuria, flank pain, frequency, hematuria, scrotal swelling, testicular pain and urgency.  Musculoskeletal: Negative.   Skin: Negative.    Physical Exam Updated Vital Signs BP 132/69 (BP Location: Left Arm)   Pulse 71   Temp (!) 97.5 F (36.4 C) (Oral)   Resp 16   SpO2 94%   Physical Exam  Constitutional: He appears well-developed and well-nourished.  Patient pacing around the room in discomfort.  Cardiovascular: Normal rate, regular rhythm and normal heart sounds.  Pulmonary/Chest: Effort normal and breath sounds normal.  Abdominal: Bowel sounds are normal. There is tenderness in the suprapubic area.  Lower abdomen and suprapubic region clearly distended and tender to palpation. Tympanic to percussion in lower abdomen.  Genitourinary: Rectum normal and testes normal. Prostate is enlarged and tender. Right testis shows no tenderness. Left testis shows no tenderness. Circumcised. Penile tenderness present. No penile erythema. No discharge found.  Lymphadenopathy: No inguinal adenopathy noted on the right or left side.  Skin: Skin is warm. Capillary refill takes 2 to 3 seconds.  Patient's face is flushed.  Nursing note and vitals reviewed.  ED Treatments / Results  Labs (all  labs ordered are listed, but only abnormal results are displayed) Labs Reviewed  URINALYSIS, ROUTINE W REFLEX MICROSCOPIC - Abnormal; Notable for the following components:      Result Value   Hgb urine dipstick SMALL (*)    Leukocytes, UA SMALL (*)    All other components within normal limits    EKG None  Radiology No results found.  Procedures Procedures (including critical care time)  Medications Ordered in ED Medications - No data to display   Initial Impression / Assessment and Plan / ED Course  Triage vital signs and the nursing notes have been reviewed.  Pertinent labs & imaging results that were available during care of the patient were reviewed and considered in medical decision making (see chart for details).  Patient presents for urinary retention x1 day. Patient has urologic history which includes prostatitis and BPH which is contributing to complaint today. Other than lower abdominal/suprapubic tenderness, there are no additional physical exam findings or history that raises concern for an acute intra-abdominal or systemic process that warrants additional evaluation. Will place Foley cath and have patient follow-up with urologist at [facility].  Clinical Course as of Nov 07 1924  Nancy Fetter Nov 07, 2017  1801 UA not suggestive of UTI.   [GM]  [phone] RN alerted this provider that Foley catheter successfully placed and that ~ 1L of urine was drained.   [GM]    Clinical Course User Index [GM] Rahn Lacuesta, Jonelle Sports, PA-C   Final Clinical Impressions(s) / ED Diagnoses  1. Urinary Retention. Foley catheter placed without complication. Advised to follow-up with [facility]. Patient currently on Cipro for prostatitis and has 7-8 days left of treatment.  Dispo: Home. After thorough clinical evaluation, this patient is determined to be medically stable and can be safely discharged with the previously mentioned treatment and/or outpatient follow-up/referral(s). At this  time, there are no other apparent medical conditions that require further screening, evaluation or treatment.   Final diagnoses:  Urinary retention    ED Discharge Orders    None        Junita Push 11/07/17 1926    Daleen Bo, MD 11/08/17 [phone]

## 2017-11-07 NOTE — ED Triage Notes (Signed)
Pt reports that he is being treated for prostatitis for a week. His urologist has been treating it with dustasteride x2 weeks and antibiotics since Tuesday. He reports that he has not been able to urinate more than a couple drops today. He is reporting a feeling of fullness in his bladder and penis pain. A&Ox4. Ambulatory. Denies N/V/F.

## 2017-11-09 DIAGNOSIS — R338 Other retention of urine: Secondary | ICD-10-CM | POA: Diagnosis not present

## 2017-11-15 ENCOUNTER — Telehealth (HOSPITAL_COMMUNITY): Payer: Self-pay

## 2017-11-15 ENCOUNTER — Other Ambulatory Visit (HOSPITAL_COMMUNITY): Payer: Self-pay

## 2017-11-15 DIAGNOSIS — R338 Other retention of urine: Secondary | ICD-10-CM | POA: Diagnosis not present

## 2017-11-15 DIAGNOSIS — N410 Acute prostatitis: Secondary | ICD-10-CM | POA: Diagnosis not present

## 2017-11-15 MED ORDER — METOPROLOL SUCCINATE ER 25 MG PO TB24: 12.5000 mg | ORAL_TABLET | Freq: Every day | ORAL | 2 refills | Status: DC

## 2017-11-15 NOTE — Telephone Encounter (Signed)
Pt called and stated he was just Dx with prostatitis and he wanted to cut back his lasix to 40 mg once daily due to the worsening of his condition.  I advised him that would be fine. Pt has a CHF appt in 2 wks. Pt reports no excess fluid or swelling.

## 2017-11-15 NOTE — Telephone Encounter (Signed)
Ok to decrease Lasix to 40 mg daily

## 2017-11-16 ENCOUNTER — Other Ambulatory Visit: Payer: Self-pay

## 2017-11-16 ENCOUNTER — Emergency Department (HOSPITAL_COMMUNITY)
Admission: EM | Admit: 2017-11-16 | Discharge: 2017-11-16 | Disposition: A | Discharge status: Home / Self Care | Payer: Medicare Other | Attending: Emergency Medicine | Admitting: Emergency Medicine

## 2017-11-16 ENCOUNTER — Encounter (HOSPITAL_COMMUNITY): Payer: Self-pay | Admitting: *Deleted

## 2017-11-16 DIAGNOSIS — I5022 Chronic systolic (congestive) heart failure: Secondary | ICD-10-CM | POA: Insufficient documentation

## 2017-11-16 DIAGNOSIS — Z87891 Personal history of nicotine dependence: Secondary | ICD-10-CM | POA: Diagnosis not present

## 2017-11-16 DIAGNOSIS — R300 Dysuria: Secondary | ICD-10-CM | POA: Insufficient documentation

## 2017-11-16 DIAGNOSIS — Z950 Presence of cardiac pacemaker: Secondary | ICD-10-CM | POA: Diagnosis not present

## 2017-11-16 DIAGNOSIS — R339 Retention of urine, unspecified: Secondary | ICD-10-CM | POA: Diagnosis not present

## 2017-11-16 DIAGNOSIS — Z7982 Long term (current) use of aspirin: Secondary | ICD-10-CM | POA: Diagnosis not present

## 2017-11-16 DIAGNOSIS — Z7901 Long term (current) use of anticoagulants: Secondary | ICD-10-CM | POA: Diagnosis not present

## 2017-11-16 DIAGNOSIS — Z79899 Other long term (current) drug therapy: Secondary | ICD-10-CM | POA: Diagnosis not present

## 2017-11-16 DIAGNOSIS — R140 Abdominal distension (gaseous): Secondary | ICD-10-CM | POA: Diagnosis not present

## 2017-11-16 DIAGNOSIS — S7001XD Contusion of right hip, subsequent encounter: Secondary | ICD-10-CM | POA: Diagnosis not present

## 2017-11-16 DIAGNOSIS — I2510 Atherosclerotic heart disease of native coronary artery without angina pectoris: Secondary | ICD-10-CM | POA: Diagnosis not present

## 2017-11-16 DIAGNOSIS — M7061 Trochanteric bursitis, right hip: Secondary | ICD-10-CM | POA: Diagnosis not present

## 2017-11-16 LAB — URINALYSIS, ROUTINE W REFLEX MICROSCOPIC
BILIRUBIN URINE: NEGATIVE
Bacteria, UA: NONE SEEN
Glucose, UA: NEGATIVE mg/dL
Hgb urine dipstick: NEGATIVE
KETONES UR: NEGATIVE mg/dL
LEUKOCYTES UA: NEGATIVE
Nitrite: POSITIVE — AB
Protein, ur: NEGATIVE mg/dL
SPECIFIC GRAVITY, URINE: 1.006 (ref 1.005–1.030)
pH: 7 (ref 5.0–8.0)

## 2017-11-16 NOTE — ED Triage Notes (Signed)
188ml on bladder scan in triage.

## 2017-11-16 NOTE — ED Provider Notes (Signed)
[facility] DEPT Provider Note   CSN: [id number] Arrival date & time: 11/16/17  0348     History   Chief Complaint No chief complaint on file.   HPI [name] is a 84 y.o. male.  Patient is an 84 year old male presenting with complaints of urinary frequency, dysuria, and urinary retention.  He has a history of enlarged prostate and has required Foley catheter in the past.  His catheter was just removed last week, but now he is experiencing frequency, dysuria, and distention.  He denies any fevers or chills.  The history is provided by the patient.    Past Medical History:  Diagnosis Date  . Arthritis    "back" (03/20/2014)  . Atrial fibrillation ([place])   . BPH (benign prostatic hypertrophy)   . Breast mass    "on both sides" (06/28/2012)  . Cardiomyopathy primary-nonischemic EF 45%  . Carotid artery occlusion   . CHF (congestive heart failure) ([place])   . Coronary artery disease   . Diverticulosis of colon without hemorrhage 01/03/2014  . Dysphagia   . Elevated liver enzymes   . Esophageal stricture   . GERD (gastroesophageal reflux disease)   . Hearing loss, mixed, bilateral   . Hx of cardiovascular stress test    Adenosine Myoview (07/2013):  Apical cap, apical lateral and mid anterolateral scar, small amount of peri-infarct ischemia, EF 36%; Medium Risk  . Hyperlipidemia   . Increased prostate specific antigen (PSA) velocity   . Internal hemorrhoids 01/03/2014  . Lumbar back pain   . Pacemaker    st jude  . Pacemaker infection ([place])    06/28/12  . Polymyalgia rheumatica ([place])   . Sick sinus syndrome [facility])     Patient Active Problem List   Diagnosis Date Noted  . History of central retinal artery occlusion 08/04/2017  . Stenosis of right internal carotid artery s/p endarterectomy 07/2017   . Thrombus of left atrial appendage without antecedent myocardial infarction 07/24/2017  . Traumatic hematoma 04/01/2017  . Left knee  pain 10/23/2016  . Prolapsed internal hemorrhoids, grade 2 - causing fecal seepage 06/10/2016  . Chronic anticoagulation 02/10/2016  . AK (actinic keratosis) 01/28/2016  . Left breast lump 07/15/2015  . Lipoma 07/08/2015  . Neuropathy 05/16/2015  . Trigger thumb of right hand 07/04/2014  . Encounter for therapeutic drug monitoring 07/02/2014  . Allergic rhinitis 02/14/2014  . Fecal incontinence 10/18/2013  . Chronic cough 08/18/2013  . Pleural effusion, right 08/16/2013  . Chronic systolic CHF (congestive heart failure) ([place]) 03/21/2013  . Recurrent right inguinal hernia 11/22/2012  . Intention tremor 11/01/2012  . Pleural effusion on right 07/06/2012  . Nonischemic cardiomyopathy ([place]) 09/20/2010  . PPM-[place] 11/27/2008  . CAD (coronary artery disease) 07/20/2008  . SICK SINUS SYNDROME 07/20/2008  . Polymyalgia rheumatica ([place]) 07/03/2008  . MIXED HEARING LOSS BILATERAL 01/10/2008  . Hyperlipidemia 03/16/2007  . Atrial fibrillation ([place]) 03/16/2007  . GERD 03/16/2007  . BPH (benign prostatic hyperplasia) 03/16/2007    Past Surgical History:  Procedure Laterality Date  . AV NODE ABLATION N/A 04/20/2016   Procedure: AV Node Ablation;  Surgeon: Evans Lance, MD;  Location: [place] CV LAB;  Service: Cardiovascular;  Laterality: N/A;  . CARDIAC CATHETERIZATION  1980's   [place]  . CARDIOVERSION  06/17/2011   Procedure: CARDIOVERSION;  Surgeon: Lelon Perla, MD;  Location: [place];  Service: Cardiovascular;  Laterality: N/A;  . CARDIOVERSION  09/09/2011   Procedure: CARDIOVERSION;  Surgeon: Dellis Filbert  Larina Earthly, MD;  Location: [place];  Service: Cardiovascular;  Laterality: N/A;  . CARDIOVERSION  01/01/2012   Procedure: CARDIOVERSION;  Surgeon: Hillary Bow, MD;  Location: [facility] ENDOSCOPY;  Service: Cardiovascular;  Laterality: N/A;  . CARDIOVERSION N/A 08/03/2014   Procedure: CARDIOVERSION;  Surgeon: Larey Dresser, MD;  Location: [facility] ENDOSCOPY;  Service: Cardiovascular;   Laterality: N/A;  . CARDIOVERSION N/A 11/07/2015   Procedure: CARDIOVERSION;  Surgeon: Larey Dresser, MD;  Location: [facility] ENDOSCOPY;  Service: Cardiovascular;  Laterality: N/A;  . CARDIOVERSION N/A 11/20/2015   Procedure: CARDIOVERSION;  Surgeon: Larey Dresser, MD;  Location: [place];  Service: Cardiovascular;  Laterality: N/A;  . CAROTID ENDARTERECTOMY    . CATARACT EXTRACTION, BILATERAL Bilateral 1980's  . COLONOSCOPY N/A 01/03/2014   Procedure: COLONOSCOPY;  Surgeon: Gatha Mayer, MD;  Location: [facility] ENDOSCOPY;  Service: Endoscopy;  Laterality: N/A;  . ENDARTERECTOMY Right 07/26/2017   Procedure: RIGHT CAROTID  ARTERY ENDARTERECTOMY;  Surgeon: Rosetta Posner, MD;  Location: [facility] OR;  Service: Vascular;  Laterality: Right;  . ESOPHAGOGASTRODUODENOSCOPY    . GENERATOR REMOVAL Left 06/15/2012   Procedure: GENERATOR REMOVAL;  Surgeon: Evans Lance, MD;  Location: [place];  Service: Cardiovascular;  Laterality: Left;  . HEMORRHOID BANDING    . INGUINAL HERNIA REPAIR Right 2012; 03/20/2014  . INGUINAL HERNIA REPAIR Right 03/20/2014   Procedure: OPEN REPAIR OF RIGHT INGUINAL WITH MESH;  Surgeon: Donnie Mesa, MD;  Location: [place];  Service: General;  Laterality: Right;  . INSERT / REPLACE / REMOVE PACEMAKER    . INSERTION OF MESH Right 03/20/2014   Procedure: INSERTION OF MESH;  Surgeon: Donnie Mesa, MD;  Location: [place];  Service: General;  Laterality: Right;  . KNEE ARTHROSCOPY WITH LATERAL MENISECTOMY Left 12/16/2016   Procedure: KNEE ARTHROSCOPY WITH LATERAL MENISECTOMY and Chondroplasty;  Surgeon: Earlie Server, MD;  Location: [place];  Service: Orthopedics;  Laterality: Left;  . LEAD REVISION N/A 06/29/2012   Procedure: LEAD REVISION;  Surgeon: Deboraha Sprang, MD;  Location: [facility] CATH LAB;  Service: Cardiovascular;  Laterality: N/A;  . PACEMAKER GENERATOR CHANGE N/A 06/15/2012   Procedure: PACEMAKER GENERATOR CHANGE;  Surgeon: Evans Lance, MD;  Location: [place];  Service: Cardiovascular;   Laterality: N/A;  . PACEMAKER REVISION  06/30/2012   Procedure: PACEMAKER LEAD REVISION;  Surgeon: Evans Lance, MD;  Location: [facility] CATH LAB;  Service: Cardiovascular;;  . PARACENTESIS  ~ 0/9604   complication from pacer change in 2014  . PATCH ANGIOPLASTY Right 07/26/2017   Procedure: WITH PATCH ANGIOPLASTY;  Surgeon: Rosetta Posner, MD;  Location: [place];  Service: Vascular;  Laterality: Right;  . PERMANENT PACEMAKER INSERTION N/A 06/28/2012   Procedure: PERMANENT PACEMAKER INSERTION;  Surgeon: Evans Lance, MD;  Location: [facility] CATH LAB;  Service: Cardiovascular;  Laterality: N/A;  . TEE WITHOUT CARDIOVERSION  01/01/2012   Procedure: TRANSESOPHAGEAL ECHOCARDIOGRAM (TEE);  Surgeon: Fay Records, MD;  Location: [facility] ENDOSCOPY;  Service: Cardiovascular;  Laterality: N/A;  . TEE WITHOUT CARDIOVERSION N/A 08/03/2014   Procedure: TRANSESOPHAGEAL ECHOCARDIOGRAM (TEE);  Surgeon: Larey Dresser, MD;  Location: [place];  Service: Cardiovascular;  Laterality: N/A;  . TEE WITHOUT CARDIOVERSION N/A 11/07/2015   Procedure: TRANSESOPHAGEAL ECHOCARDIOGRAM (TEE);  Surgeon: Larey Dresser, MD;  Location: [place];  Service: Cardiovascular;  Laterality: N/A;  . TONSILLECTOMY AND ADENOIDECTOMY  1936  . UMBILICAL HERNIA REPAIR  [phone]        Home Medications  Prior to Admission medications   Medication Sig Start Date End Date Taking? Authorizing Provider  acetaminophen (TYLENOL) 500 MG tablet Take 1,000 mg by mouth every 6 (six) hours as needed for moderate pain or headache.    [provider]  apixaban (ELIQUIS) 5 MG TABS tablet Take 1 tablet (5 mg total) by mouth 2 (two) times daily. 08/27/17   Larey Dresser, MD  aspirin EC 81 MG tablet Take 1 tablet (81 mg total) by mouth daily. 07/27/17 10/20/18  Dagoberto Ligas, PA-C  fluticasone (FLONASE) 50 MCG/ACT nasal spray Place 2 sprays into both nostrils daily as needed for allergies or rhinitis. 07/02/17   [name], MD    furosemide (LASIX) 80 MG tablet Take 80 mg by mouth daily.    [provider]  hydroxypropyl methylcellulose / hypromellose (ISOPTO TEARS / GONIOVISC) 2.5 % ophthalmic solution Place 1 drop into both eyes daily as needed for dry eyes.    [provider]  losartan (COZAAR) 25 MG tablet Take 12.5 mg by mouth daily.    [provider]  metoprolol succinate (TOPROL-XL) 25 MG 24 hr tablet Take 0.5 tablets (12.5 mg total) by mouth daily. Please call for office visit [phone] 11/15/17   Larey Dresser, MD  potassium chloride SA (KLOR-CON M20) 20 MEQ tablet Take 1 tablet (20 mEq total) by mouth 2 (two) times daily. 10/07/17   Larey Dresser, MD  pravastatin (PRAVACHOL) 80 MG tablet TAKE 1/2 TABLET (40 MG TOTAL) DAILY 06/28/17   [name], MD  predniSONE (DELTASONE) 1 MG tablet Take 1-2 tablets (1-2 mg total) by mouth daily with breakfast. Patient taking differently: Take 1 mg by mouth daily with breakfast.  06/22/17   [name], MD  spironolactone (ALDACTONE) 25 MG tablet Take 12.5 mg by mouth daily.    [provider]  tamsulosin (FLOMAX) 0.4 MG CAPS capsule TAKE 1 CAPSULE EVERY DAY 06/28/17   [name], MD    Family History Family History  Problem Relation Age of Onset  . Heart attack Father 68       deceased  . AAA (abdominal aortic aneurysm) Mother        deceased AAA  . Hypertension Mother   . Other Brother        deceased at birth  . Healthy Son   . Healthy Daughter   . Diabetes Cousin     Social History Social History   Tobacco Use  . Smoking status: Former Smoker    Packs/day: 1.00    Years: 40.00    Pack years: 40.00    Types: Cigarettes    Last attempt to quit: 03/10/1983    Years since quitting: 34.7  . Smokeless tobacco: Never Used  Substance Use Topics  . Alcohol use: No    Alcohol/week: 0.0 standard drinks    Comment: quit drinking 2000-? alcohol related cardiomyopathy  . Drug use: No     Allergies    Antihistamines, diphenhydramine-type   Review of Systems Review of Systems  All other systems reviewed and are negative.    Physical Exam Updated Vital Signs BP 110/66 (BP Location: Left Arm)   Pulse 74   Temp 97.9 F (36.6 C) (Oral)   Resp 16   SpO2 96%   Physical Exam  Constitutional: He is oriented to person, place, and time. He appears well-developed and well-nourished. No distress.  HENT:  Head: Normocephalic and atraumatic.  Mouth/Throat: Oropharynx is clear and moist.  Neck: Normal range of motion. Neck supple.  Cardiovascular: Normal rate and regular rhythm. Exam reveals no friction rub.  No murmur heard. Pulmonary/Chest: Effort normal and breath sounds normal. No respiratory distress. He has no wheezes. He has no rales.  Abdominal: Soft. Bowel sounds are normal. He exhibits no distension. There is no tenderness.  Musculoskeletal: Normal range of motion. He exhibits no edema.  Neurological: He is alert and oriented to person, place, and time. Coordination normal.  Skin: Skin is warm and dry. He is not diaphoretic.  Nursing note and vitals reviewed.    ED Treatments / Results  Labs (all labs ordered are listed, but only abnormal results are displayed) Labs Reviewed  URINALYSIS, ROUTINE W REFLEX MICROSCOPIC    EKG None  Radiology No results found.  Procedures Procedures (including critical care time)  Medications Ordered in ED Medications - No data to display   Initial Impression / Assessment and Plan / ED Course  I have reviewed the triage vital signs and the nursing notes.  Pertinent labs & imaging results that were available during my care of the patient were reviewed by me and considered in my medical decision making (see chart for details).  Urinalysis equivocal for UTI.  He is currently taking Cipro and has had Foley catheters inserted and removed on 2 occasions.  He has an appointment to see urology tomorrow and I believe he is appropriate  for discharge until that appointment.  He was able to void here in the ER and symptoms have significantly improved.  Final Clinical Impressions(s) / ED Diagnoses   Final diagnoses:  None    ED Discharge Orders    None       Veryl Speak, MD 11/16/17 [phone]

## 2017-11-16 NOTE — ED Triage Notes (Signed)
Pt arrives with c/o no urinating a lot and painful urination since about midnight tonight. He reports he had a catheter in for prostatitis that was removed yesterday morning.

## 2017-11-16 NOTE — Discharge Instructions (Addendum)
Continue Cipro as previously prescribed.  Follow-up with your urologist tomorrow as scheduled, and return to the ER if symptoms significantly worsen or change.

## 2017-11-17 DIAGNOSIS — N401 Enlarged prostate with lower urinary tract symptoms: Secondary | ICD-10-CM | POA: Diagnosis not present

## 2017-11-17 DIAGNOSIS — R3912 Poor urinary stream: Secondary | ICD-10-CM | POA: Diagnosis not present

## 2017-11-17 DIAGNOSIS — R3914 Feeling of incomplete bladder emptying: Secondary | ICD-10-CM | POA: Diagnosis not present

## 2017-12-03 ENCOUNTER — Encounter (HOSPITAL_COMMUNITY): Payer: Medicare Other | Admitting: Cardiology

## 2017-12-08 DIAGNOSIS — N401 Enlarged prostate with lower urinary tract symptoms: Secondary | ICD-10-CM | POA: Diagnosis not present

## 2017-12-08 DIAGNOSIS — R3912 Poor urinary stream: Secondary | ICD-10-CM | POA: Diagnosis not present

## 2017-12-13 ENCOUNTER — Other Ambulatory Visit (HOSPITAL_COMMUNITY): Payer: Self-pay | Admitting: Cardiology

## 2017-12-13 ENCOUNTER — Telehealth (HOSPITAL_COMMUNITY): Payer: Self-pay

## 2017-12-13 NOTE — Telephone Encounter (Signed)
Medication Samples have been provided to the patient.  Drug name: Eliquis       Strength: 5 mg        Qty: 3  LOT: [id number] (2) and [id number] (1)  Exp.Date: 11/21 (2) and 06/21 (1)  Dosing instructions: Take 1 tablet (5 mg total) by mouth 2 (two) times daily  The patient has been instructed regarding the correct time, dose, and frequency of taking this medication, including desired effects and most common side effects.   Vanice Sarah 9:46 AM 12/13/2017

## 2017-12-15 DIAGNOSIS — Z23 Encounter for immunization: Secondary | ICD-10-CM | POA: Diagnosis not present

## 2017-12-17 ENCOUNTER — Ambulatory Visit (INDEPENDENT_AMBULATORY_CARE_PROVIDER_SITE_OTHER): Payer: Medicare Other | Admitting: *Deleted

## 2017-12-17 ENCOUNTER — Ambulatory Visit (HOSPITAL_COMMUNITY)
Admission: RE | Admit: 2017-12-17 | Discharge: 2017-12-17 | Disposition: A | Discharge status: Home / Self Care | Payer: Medicare Other | Source: Ambulatory Visit | Attending: Cardiology | Admitting: Cardiology

## 2017-12-17 ENCOUNTER — Other Ambulatory Visit (HOSPITAL_COMMUNITY): Payer: Self-pay

## 2017-12-17 VITALS — BP 92/62 | HR 79 | Wt 154.2 lb

## 2017-12-17 DIAGNOSIS — Z87891 Personal history of nicotine dependence: Secondary | ICD-10-CM | POA: Diagnosis not present

## 2017-12-17 DIAGNOSIS — I495 Sick sinus syndrome: Secondary | ICD-10-CM

## 2017-12-17 DIAGNOSIS — Z9889 Other specified postprocedural states: Secondary | ICD-10-CM

## 2017-12-17 DIAGNOSIS — Z7982 Long term (current) use of aspirin: Secondary | ICD-10-CM | POA: Diagnosis not present

## 2017-12-17 DIAGNOSIS — I5022 Chronic systolic (congestive) heart failure: Secondary | ICD-10-CM | POA: Diagnosis not present

## 2017-12-17 DIAGNOSIS — M353 Polymyalgia rheumatica: Secondary | ICD-10-CM | POA: Diagnosis not present

## 2017-12-17 DIAGNOSIS — I428 Other cardiomyopathies: Secondary | ICD-10-CM | POA: Insufficient documentation

## 2017-12-17 DIAGNOSIS — E785 Hyperlipidemia, unspecified: Secondary | ICD-10-CM | POA: Insufficient documentation

## 2017-12-17 DIAGNOSIS — Z79899 Other long term (current) drug therapy: Secondary | ICD-10-CM | POA: Diagnosis not present

## 2017-12-17 DIAGNOSIS — N400 Enlarged prostate without lower urinary tract symptoms: Secondary | ICD-10-CM | POA: Diagnosis not present

## 2017-12-17 DIAGNOSIS — H3411 Central retinal artery occlusion, right eye: Secondary | ICD-10-CM | POA: Insufficient documentation

## 2017-12-17 DIAGNOSIS — I4891 Unspecified atrial fibrillation: Secondary | ICD-10-CM | POA: Diagnosis not present

## 2017-12-17 DIAGNOSIS — I480 Paroxysmal atrial fibrillation: Secondary | ICD-10-CM | POA: Diagnosis not present

## 2017-12-17 DIAGNOSIS — K219 Gastro-esophageal reflux disease without esophagitis: Secondary | ICD-10-CM | POA: Insufficient documentation

## 2017-12-17 DIAGNOSIS — Z7901 Long term (current) use of anticoagulants: Secondary | ICD-10-CM | POA: Diagnosis not present

## 2017-12-17 DIAGNOSIS — I6521 Occlusion and stenosis of right carotid artery: Secondary | ICD-10-CM | POA: Diagnosis not present

## 2017-12-17 DIAGNOSIS — Z8249 Family history of ischemic heart disease and other diseases of the circulatory system: Secondary | ICD-10-CM | POA: Insufficient documentation

## 2017-12-17 DIAGNOSIS — I2510 Atherosclerotic heart disease of native coronary artery without angina pectoris: Secondary | ICD-10-CM | POA: Insufficient documentation

## 2017-12-17 LAB — BASIC METABOLIC PANEL
ANION GAP: 8 (ref 5–15)
BUN: 17 mg/dL (ref 8–23)
CALCIUM: 9.5 mg/dL (ref 8.9–10.3)
CO2: 27 mmol/L (ref 22–32)
CREATININE: 0.85 mg/dL (ref 0.61–1.24)
Chloride: 104 mmol/L (ref 98–111)
Glucose, Bld: 110 mg/dL — ABNORMAL HIGH (ref 70–99)
Potassium: 4.1 mmol/L (ref 3.5–5.1)
Sodium: 139 mmol/L (ref 135–145)

## 2017-12-17 LAB — CBC
HCT: 41.1 % (ref 39.0–52.0)
Hemoglobin: 13.4 g/dL (ref 13.0–17.0)
MCH: 30.5 pg (ref 26.0–34.0)
MCHC: 32.6 g/dL (ref 30.0–36.0)
MCV: 93.4 fL (ref 80.0–100.0)
NRBC: 0 % (ref 0.0–0.2)
PLATELETS: 239 10*3/uL (ref 150–400)
RBC: 4.4 MIL/uL (ref 4.22–5.81)
RDW: 14 % (ref 11.5–15.5)
WBC: 6.6 10*3/uL (ref 4.0–10.5)

## 2017-12-17 MED ORDER — FUROSEMIDE 80 MG PO TABS: ORAL_TABLET | ORAL | 3 refills | Status: DC

## 2017-12-17 MED ORDER — ROSUVASTATIN CALCIUM 10 MG PO TABS: 10.0000 mg | ORAL_TABLET | Freq: Every day | ORAL | 3 refills | Status: DC

## 2017-12-17 MED ORDER — APIXABAN 5 MG PO TABS: 5.0000 mg | ORAL_TABLET | Freq: Two times a day (BID) | ORAL | 3 refills | Status: DC

## 2017-12-17 NOTE — Patient Instructions (Addendum)
STOP Pravastatin.   STOP Aspirin.  START Crestor 10mg  daily.  Take Lasix 80mg  in the AM and 40mg  in the PM for 3 days.  Then take Lasix 80mg  every morning and 40mg  every other evening.  Routine lab work today. Will notify you of abnormal results  Repeat labs in 10days.  Follow up in 9months with Dr.McLean

## 2017-12-17 NOTE — Progress Notes (Signed)
Advanced Heart Failure Clinic Note  Patient ID: [name], male   DOB: Aug 11, 1929, 84 y.o.   MRN: [id number] PCP: Dr. Yong Channel Cardiology: Dr. Gerarda Fraction [name] is a 84 y.o. male with history of paroxysmal atrial fibrillation, nonischemic cardiomyopathy, and complete heart block with St Jude CRT-P system presents for cardiology followup.  He was admitted in 4/14 with pacemaker pocket infection.  His pacemaker was removed and temporary-permanent device was placed.  Later, he had the temporary-permanent device removed and a new CRT-P device was placed. He developed dyspnea post-operatively and was found to have a large right-sided pleural effusion.  He was admitted and got a chest tube for drainage. He had followup with Dr. Servando Snare and it was decided that he would not need VATS.  He had been on amiodarone for maintenance of NSR.  This had been more successful than Tikosyn.   Around 3/15, he started developing increasing exertional dyspnea.  He was short of breath walking up a hill or incline.  He could still walk on the treadmill for exercise for 10-15 minutes and use the elliptical without much trouble on most days.  No orthopnea or PND.  No chest pain.  He saw Dr Leanne Chang and was started on Lasix three times a week due to concern for volume overload.  This did not seem to have helped much.  After that, he had PFTs done which showed normal spirometry but DLCO 50% predicted.  Therefore, amiodarone was stopped due to concern for lung toxicity.  He saw Richardson Dopp for a pre-operative evaluation prior to right inguinal hernia repair.  Given the exertional dyspnea, he was set up for adenosine Cardiolite in 5/15.  This showed EF 36% with a small area of primarily scar in the apex, apical lateral wall, and mid anterolateral wall.  He has not felt palpitations. Echo in 5/15 showed EF 40-45%, similar to prior study.   Dr Aundra Dubin had him see pulmonary for evaluation for amiodarone lung toxicity.  CT  chest looked ok, and he was not thought to have amiodarone lung toxicity.   At a prior appointment in 2016, Mr Pienta was noted to be back in atrial fibrillation.  He was more short of breath with exertion and fatigued.  He has historically tolerated atrial fibrillation poorly.  I did a TEE-guided DCCV in 5/16 back to NSR.  TEE showed EF 35-40%.  He did not immediately feel better like he has in the past post-DCCV, so Lasix was increased.    Recurrent symptomatic atrial fibrillation in 8/17 with TEE-guided DCCV to NSR.  TEE showed EF 25-30%.  Recurrent symptomatic atrial fibrillation with CHF exacerbation in 9/17.  He had repeat DCCV and Lasix was increased.   He was back in atrial fibrillation in 11/17 and appears to have been out of rhythm since then.  He was more short of breath in atrial fibrillation with a significant fall in BiV pacing percentage.  Lasix was significantly increased to try to cope with volume overload.  In 2/18, he had AV nodal ablation to promote BiV pacing.    Admitted 5/19 with right eye lower visual field loss, found to have severe right carotid stenosis. S/p R CEA on 07/26/17. Switched from coumadin to Eliquis. Volume was stable.  Echo in 5/19 showed Ef 30-35%, mild AI.   Today, he presents for followup of CHF. Overall doing well. He still has right lower visual field loss, but is getting used to it. Exercising on the eliptical  and bike.  Feels like he is a little more short of breath than in the past => with walking up hills and stairs. No CP, dizziness, or palpitations. Denies bleeding on eliquis. SBP 90-100s at home. Weights ~150 lbs at home. Taking all medications.   Labs (5/19): LDL 72, HDL 41, K 4.2, creatinine 0.86  St Jude device interrogation: 99% BiV pacing  PMH: 1. Low back pain 2. Hyperlipidemia 3. GERD 4. BPH 5. Polymyalgia rheumatica 6. Atrial fibrillation: Failed Tikosyn in the past.  Paroxysmal, on amiodarone and warfarin.  DCCV in 4/13, 7/13, 10/13.   Has held NSR with amiodarone.  However, concern for amiodarone lung toxicity: PFTs (4/15) with FVC 90% predicted, FEV1 90%, ratio 97%, DLCO 50%.   Recurrence of atrial fibrillation in 5/16, TEE-guided DCCV back to NSR in 5/16.  - Recurrence of atrial fibrillation in 8/17. TEE-guided DCCV 11/07/15.  - Recurrence of atrial fibrillation 9/17.  DCCV 11/20/15 but back in atrial fibrillation by 11/26/15.  - Amiodarone stopped due to persistent atrial fibrillation.  - AV nodal ablation in 2/18 to promote BiV pacing.  7. Transaminitis: Mild, attributed to amiodarone.  8. Complete heart block s/p St Jude CRT-P device.  Patient developed PCM pocket infection in 4/14 and had his first system removed with placement of a temporary permanent PCM.  He later had CRT-P device replaced.  This was complicated by large right-sided hemorrhagic pleural effusion requiring chest tube.   9. Nonischemic cardiomyopathy: Echo (9/13) with EF 40-45%, diffuse HK, mild MR.  Mild CAD only on prior LHC.  Echo (4/14) with EF 45-50%.  Adenosine Cardiolite (5/15) with EF 36% (visually appeared higher per report), small area of scar with peri-infarct ischemia in the apex, apical lateral wall, and mid anterolateral wall. Echo (5/15) with EF 40-45%, basal to mid inferolateral severe hypokinesis, basal to mid anterolateral hypokinesis, mildly dilated RV with mildly decreased RV systolic function, mild to moderate MR.  TEE (5/16) with EF 35-40%, inferior and inferolateral severe hypokinesis, normal RV size with mildly decreased systolic function, moderate MR, peak RV-RA gradient 26 mmHg.  - TEE (8/17): EF 25-30%, mildly dilated RV with mildly decreased systolic function, moderate biatrial enlargement, moderate central MR.  - AV nodal ablation 2/18.  - Echo (5/19): EF 30-35%, mild AI.  10. Large right pleural effusion requiring chest tube following removal and reimplantation of PCM. Most recent chest CT in 6/15 showed significant decrease in size of  loculated pleural effusion since 5/14.   11. Right CEA 5/19.  12. Right central retinal artery occlusion in 5/19.   SH: Married, prior smoker (quit 1985), no ETOH x years, retired, lives at [facility]  [place]: Father with MI at 79, mother with AAA.   Review of systems complete and found to be negative unless listed in HPI.   Current Outpatient Medications  Medication Sig Dispense Refill  . acetaminophen (TYLENOL) 500 MG tablet Take 1,000 mg by mouth every 6 (six) hours as needed for moderate pain or headache.    Marland Kitchen apixaban (ELIQUIS) 5 MG TABS tablet Take 1 tablet (5 mg total) by mouth 2 (two) times daily. 180 tablet 3  . aspirin EC 81 MG tablet Take 1 tablet (81 mg total) by mouth daily. 150 tablet 2  . dutasteride (AVODART) 0.5 MG capsule Take 0.5 mg by mouth daily.     . fluticasone (FLONASE) 50 MCG/ACT nasal spray Place 2 sprays into both nostrils daily as needed for allergies or rhinitis. (Patient taking differently: Place 2  sprays into both nostrils daily. ) 16 g 3  . furosemide (LASIX) 80 MG tablet Take 80 mg by mouth daily.     . hydroxypropyl methylcellulose / hypromellose (ISOPTO TEARS / GONIOVISC) 2.5 % ophthalmic solution Place 1 drop into both eyes 3 (three) times daily as needed for dry eyes.     Marland Kitchen losartan (COZAAR) 25 MG tablet Take 12.5 mg by mouth daily.    . metoprolol succinate (TOPROL-XL) 25 MG 24 hr tablet Take 0.5 tablets (12.5 mg total) by mouth daily. Please call for office visit [phone] (Patient taking differently: Take 12.5 mg by mouth daily. ) 45 tablet 2  . phenazopyridine (PYRIDIUM) 200 MG tablet Take 200 mg by mouth 3 (three) times daily as needed for pain.    . potassium chloride SA (KLOR-CON M20) 20 MEQ tablet Take 1 tablet (20 mEq total) by mouth 2 (two) times daily. 180 tablet 2  . pravastatin (PRAVACHOL) 80 MG tablet TAKE 1/2 TABLET (40 MG TOTAL) DAILY (Patient taking differently: Take 40 mg by mouth daily. ) 45 tablet 3  . spironolactone (ALDACTONE) 25 MG  tablet Take 12.5 mg by mouth daily.    . tamsulosin (FLOMAX) 0.4 MG CAPS capsule TAKE 1 CAPSULE EVERY DAY (Patient taking differently: TAKE 1 CAPSULE EVERY DAY) 90 capsule 3   No current facility-administered medications for this encounter.     BP 92/62 (BP Location: Left Arm, Patient Position: Sitting, Cuff Size: Normal)   Pulse 79   Wt 69.9 kg (154 lb 3.2 oz)   SpO2 97%   BMI 24.89 kg/m   Wt Readings from Last 3 Encounters:  12/17/17 69.9 kg (154 lb 3.2 oz)  11/07/17 68 kg (150 lb)  09/14/17 69.6 kg (153 lb 6.4 oz)   General: Well appearing. No resp difficulty. HEENT: Normal Neck: Supple. JVP 8-9 cm. Carotids 2+ bilat; no bruits. No thyromegaly or nodule noted. Cor: PMI nondisplaced. RRR, No M/G/R noted Lungs: CTAB, normal effort. Abdomen: Soft, non-tender, non-distended, no HSM. No bruits or masses. +BS  Extremities: No cyanosis, clubbing, or rash. R and LLE no edema.  Neuro: Alert & orientedx3, cranial nerves grossly intact. moves all 4 extremities w/o difficulty. Affect pleasant  Assessment/Plan: 1. Chronic systolic CHF:  Presumed nonischemic cardiomyopathy (prior cath with only mild CAD).  He had an adenosine Cardiolite in 5/15 with EF 36% and possible small area of apical and lateral scar with peri-infarct ischemia. He has St Jude CRT-P.  Echo 5/19 with EF 30-35%. NYHA class II but feels like he has more exertional dyspnea than in the past.  Suspect mild volume overload on exam.  - Increase Lasix to 80 qam/40 qpm x 3 days, then 80 qpm with 40 mg every other afternoon. BMET today and again in 10 days.  - Continue Toprol 12.5 mg daily.  - Continue spironolactone 12.5 mg daily.  - Continue losartan 12.5 mg daily. No BP room for increase. SBP 90-100s at home.  2. Atrial fibrillation: Now persistent.  Had breakthrough with Tikosyn and now not able to hold NSR on amiodarone. He is now off amiodarone.  He is status post AV nodal ablation with significant improvement in symptoms and  BiV pacing percentage (99% today).  - Recently switched to apixiban 5 mg BID. Denies bleeding.  3. Hyperlipidemia: History of nonobstructive CAD and recent CEA.    - Will use more potent statin, stop pravastatin and start Crestor 10 mg daily. Lipids/LFTs in 2 months.  4. Rt CEA occlusion s/p R  CEA 07/26/17: He had an ulcerated, 70% stenotic plaque in the right internal carotid. Suspect this is the source of embolism for CRAO.     - He was switched from coumadin to Eliquis. Denies bleeding.  - Change statin as above.   Georgiana Shore, NP 12/17/2017   Patient seen with NP, agree with the above note.  Patient is mildly more short of breath than in the past, probably NYHA class II still.  Mild volume overload.  - Increase Lasix as described above.  Check BMET today and again in 10 days.  - No BP room to increase his cardiomyopathy meds.   Right CEA in 5/19 with right CRAO.  I think he needs a more potent statin.  Will stop pravastatin and start Crestor 10 mg daily.  Lipids/LFTs in 2 months.   He can stop ASA with use of Eliquis.   Followup in 2 months.   [name] 12/18/2017

## 2017-12-21 DIAGNOSIS — M7061 Trochanteric bursitis, right hip: Secondary | ICD-10-CM | POA: Diagnosis not present

## 2017-12-23 ENCOUNTER — Encounter: Payer: Self-pay | Admitting: Cardiology

## 2017-12-23 NOTE — Progress Notes (Signed)
Remote pacemaker transmission.   

## 2017-12-27 ENCOUNTER — Ambulatory Visit (HOSPITAL_COMMUNITY)
Admission: RE | Admit: 2017-12-27 | Discharge: 2017-12-27 | Disposition: A | Discharge status: Home / Self Care | Payer: Medicare Other | Source: Ambulatory Visit | Attending: Internal Medicine | Admitting: Internal Medicine

## 2017-12-27 DIAGNOSIS — I5022 Chronic systolic (congestive) heart failure: Secondary | ICD-10-CM | POA: Diagnosis not present

## 2017-12-27 LAB — BASIC METABOLIC PANEL
ANION GAP: 11 (ref 5–15)
BUN: 22 mg/dL (ref 8–23)
CHLORIDE: 101 mmol/L (ref 98–111)
CO2: 26 mmol/L (ref 22–32)
Calcium: 9.2 mg/dL (ref 8.9–10.3)
Creatinine, Ser: 0.96 mg/dL (ref 0.61–1.24)
GFR calc Af Amer: >60 mL/min (ref >60)
GFR calc non Af Amer: >60 mL/min (ref >60)
GLUCOSE: 122 mg/dL — AB (ref 70–99)
POTASSIUM: 3.9 mmol/L (ref 3.5–5.1)
Sodium: 138 mmol/L (ref 135–145)

## 2017-12-28 DIAGNOSIS — M7061 Trochanteric bursitis, right hip: Secondary | ICD-10-CM | POA: Diagnosis not present

## 2017-12-29 ENCOUNTER — Other Ambulatory Visit: Payer: Self-pay | Admitting: Internal Medicine

## 2018-01-12 DIAGNOSIS — M5416 Radiculopathy, lumbar region: Secondary | ICD-10-CM | POA: Diagnosis not present

## 2018-01-13 ENCOUNTER — Other Ambulatory Visit: Payer: Self-pay | Admitting: Neurosurgery

## 2018-01-13 DIAGNOSIS — M5416 Radiculopathy, lumbar region: Secondary | ICD-10-CM

## 2018-01-17 ENCOUNTER — Ambulatory Visit
Admission: RE | Admit: 2018-01-17 | Discharge: 2018-01-17 | Disposition: A | Discharge status: Home / Self Care | Payer: Medicare Other | Source: Ambulatory Visit | Attending: Neurosurgery | Admitting: Neurosurgery

## 2018-01-17 DIAGNOSIS — M5416 Radiculopathy, lumbar region: Secondary | ICD-10-CM

## 2018-01-17 DIAGNOSIS — M48061 Spinal stenosis, lumbar region without neurogenic claudication: Secondary | ICD-10-CM | POA: Diagnosis not present

## 2018-01-25 DIAGNOSIS — M5416 Radiculopathy, lumbar region: Secondary | ICD-10-CM | POA: Diagnosis not present

## 2018-01-25 DIAGNOSIS — M48062 Spinal stenosis, lumbar region with neurogenic claudication: Secondary | ICD-10-CM | POA: Diagnosis not present

## 2018-01-25 LAB — CUP PACEART REMOTE DEVICE CHECK
Battery Remaining Longevity: 68 mo
Date Time Interrogation Session: 20191011131735
Implantable Lead Implant Date: 20140424
Implantable Lead Location: 753860
Implantable Lead Model: 5076
Implantable Lead Model: 5076
Implantable Pulse Generator Implant Date: 20140422
Lead Channel Impedance Value: 390 Ohm
Lead Channel Pacing Threshold Amplitude: 0.75 V
Lead Channel Pacing Threshold Amplitude: 1.25 V
Lead Channel Pacing Threshold Pulse Width: 0.5 ms
Lead Channel Pacing Threshold Pulse Width: 0.5 ms
Lead Channel Sensing Intrinsic Amplitude: 12 mV
Lead Channel Setting Pacing Amplitude: 2.5 V
Lead Channel Setting Pacing Pulse Width: 0.5 ms
Lead Channel Setting Pacing Pulse Width: 0.5 ms
Lead Channel Setting Sensing Sensitivity: 12.5 mV
MDC IDC LEAD IMPLANT DT: 20140422
MDC IDC LEAD IMPLANT DT: 20140424
MDC IDC LEAD LOCATION: 753858
MDC IDC LEAD LOCATION: 753859
MDC IDC MSMT BATTERY REMAINING PERCENTAGE: 73 %
MDC IDC MSMT BATTERY VOLTAGE: 2.92 V
MDC IDC MSMT LEADCHNL LV IMPEDANCE VALUE: 900 Ohm
MDC IDC PG SERIAL: 2926516
MDC IDC SET LEADCHNL LV PACING AMPLITUDE: 2.5 V

## 2018-02-07 DIAGNOSIS — M5416 Radiculopathy, lumbar region: Secondary | ICD-10-CM | POA: Diagnosis not present

## 2018-02-07 DIAGNOSIS — M48062 Spinal stenosis, lumbar region with neurogenic claudication: Secondary | ICD-10-CM | POA: Diagnosis not present

## 2018-02-07 DIAGNOSIS — M47816 Spondylosis without myelopathy or radiculopathy, lumbar region: Secondary | ICD-10-CM | POA: Diagnosis not present

## 2018-02-10 ENCOUNTER — Telehealth (HOSPITAL_COMMUNITY): Payer: Self-pay

## 2018-02-10 NOTE — Telephone Encounter (Signed)
surgical clearance faxed to [facility] 02/10/18

## 2018-02-17 ENCOUNTER — Encounter (HOSPITAL_COMMUNITY): Payer: Self-pay | Admitting: Cardiology

## 2018-02-17 ENCOUNTER — Ambulatory Visit (HOSPITAL_COMMUNITY)
Admission: RE | Admit: 2018-02-17 | Discharge: 2018-02-17 | Disposition: A | Discharge status: Home / Self Care | Payer: Medicare Other | Source: Ambulatory Visit | Attending: Cardiology | Admitting: Cardiology

## 2018-02-17 VITALS — BP 120/58 | HR 79 | Wt 153.0 lb

## 2018-02-17 DIAGNOSIS — Z9889 Other specified postprocedural states: Secondary | ICD-10-CM | POA: Insufficient documentation

## 2018-02-17 DIAGNOSIS — I5022 Chronic systolic (congestive) heart failure: Secondary | ICD-10-CM | POA: Diagnosis not present

## 2018-02-17 DIAGNOSIS — I4819 Other persistent atrial fibrillation: Secondary | ICD-10-CM | POA: Diagnosis not present

## 2018-02-17 DIAGNOSIS — Z950 Presence of cardiac pacemaker: Secondary | ICD-10-CM | POA: Diagnosis not present

## 2018-02-17 DIAGNOSIS — N400 Enlarged prostate without lower urinary tract symptoms: Secondary | ICD-10-CM | POA: Diagnosis not present

## 2018-02-17 DIAGNOSIS — I6521 Occlusion and stenosis of right carotid artery: Secondary | ICD-10-CM | POA: Diagnosis not present

## 2018-02-17 DIAGNOSIS — M353 Polymyalgia rheumatica: Secondary | ICD-10-CM | POA: Diagnosis not present

## 2018-02-17 DIAGNOSIS — I428 Other cardiomyopathies: Secondary | ICD-10-CM | POA: Diagnosis not present

## 2018-02-17 DIAGNOSIS — Z87891 Personal history of nicotine dependence: Secondary | ICD-10-CM | POA: Diagnosis not present

## 2018-02-17 DIAGNOSIS — Z7901 Long term (current) use of anticoagulants: Secondary | ICD-10-CM | POA: Diagnosis not present

## 2018-02-17 DIAGNOSIS — I2510 Atherosclerotic heart disease of native coronary artery without angina pectoris: Secondary | ICD-10-CM | POA: Insufficient documentation

## 2018-02-17 DIAGNOSIS — I4891 Unspecified atrial fibrillation: Secondary | ICD-10-CM

## 2018-02-17 DIAGNOSIS — Z79899 Other long term (current) drug therapy: Secondary | ICD-10-CM | POA: Diagnosis not present

## 2018-02-17 DIAGNOSIS — E785 Hyperlipidemia, unspecified: Secondary | ICD-10-CM | POA: Diagnosis not present

## 2018-02-17 DIAGNOSIS — Z8249 Family history of ischemic heart disease and other diseases of the circulatory system: Secondary | ICD-10-CM | POA: Diagnosis not present

## 2018-02-17 LAB — BASIC METABOLIC PANEL
ANION GAP: 10 (ref 5–15)
BUN: 20 mg/dL (ref 8–23)
CALCIUM: 9.4 mg/dL (ref 8.9–10.3)
CO2: 25 mmol/L (ref 22–32)
CREATININE: 0.99 mg/dL (ref 0.61–1.24)
Chloride: 102 mmol/L (ref 98–111)
Glucose, Bld: 98 mg/dL (ref 70–99)
Potassium: 4.4 mmol/L (ref 3.5–5.1)
SODIUM: 137 mmol/L (ref 135–145)

## 2018-02-17 LAB — LIPID PANEL
CHOL/HDL RATIO: 2.7 ratio
CHOLESTEROL: 118 mg/dL (ref 0–200)
HDL: 44 mg/dL (ref >40)
LDL CALC: 66 mg/dL (ref 0–99)
TRIGLYCERIDES: 38 mg/dL (ref <150)
VLDL: 8 mg/dL (ref 0–40)

## 2018-02-17 LAB — HEPATIC FUNCTION PANEL
ALK PHOS: 69 U/L (ref 38–126)
ALT: 19 U/L (ref 0–44)
AST: 26 U/L (ref 15–41)
Albumin: 4.1 g/dL (ref 3.5–5.0)
BILIRUBIN DIRECT: 0.2 mg/dL (ref 0.0–0.2)
BILIRUBIN INDIRECT: 0.8 mg/dL (ref 0.3–0.9)
TOTAL PROTEIN: 7 g/dL (ref 6.5–8.1)
Total Bilirubin: 1 mg/dL (ref 0.3–1.2)

## 2018-02-17 MED ORDER — METOPROLOL SUCCINATE ER 25 MG PO TB24: 25.0000 mg | ORAL_TABLET | Freq: Every day | ORAL | 2 refills | Status: DC

## 2018-02-17 NOTE — Patient Instructions (Addendum)
Labs done today  Labs need to be done in 2 weeks  INCREASE Toprol XL 25mg  (1 tab) daily  You have been referred to [facility]: Dr. Lovena Le. This office will contact you in order to schedule your appointment.  Follow up with Dr. Aundra Dubin in 3 months

## 2018-02-17 NOTE — Progress Notes (Signed)
Advanced Heart Failure Clinic Note  Patient ID: [name], male   DOB: 1929-12-17, 84 y.o.   MRN: [id number] PCP: Dr. Yong Channel Cardiology: Dr. Gerarda Fraction PHILLIPS [name] is a 84 y.o. male with history of paroxysmal atrial fibrillation, nonischemic cardiomyopathy, and complete heart block with St Jude CRT-P system presents for cardiology followup.  He was admitted in 4/14 with pacemaker pocket infection.  His pacemaker was removed and temporary-permanent device was placed.  Later, he had the temporary-permanent device removed and a new CRT-P device was placed. He developed dyspnea post-operatively and was found to have a large right-sided pleural effusion.  He was admitted and got a chest tube for drainage. He had followup with Dr. Servando Snare and it was decided that he would not need VATS.  He had been on amiodarone for maintenance of NSR.  This had been more successful than Tikosyn.   Around 3/15, he started developing increasing exertional dyspnea.  He was short of breath walking up a hill or incline.  He could still walk on the treadmill for exercise for 10-15 minutes and use the elliptical without much trouble on most days.  No orthopnea or PND.  No chest pain.  He saw Dr Leanne Chang and was started on Lasix three times a week due to concern for volume overload.  This did not seem to have helped much.  After that, he had PFTs done which showed normal spirometry but DLCO 50% predicted.  Therefore, amiodarone was stopped due to concern for lung toxicity.  He saw Richardson Dopp for a pre-operative evaluation prior to right inguinal hernia repair.  Given the exertional dyspnea, he was set up for adenosine Cardiolite in 5/15.  This showed EF 36% with a small area of primarily scar in the apex, apical lateral wall, and mid anterolateral wall.  He has not felt palpitations. Echo in 5/15 showed EF 40-45%, similar to prior study.   I had him see pulmonary for evaluation for amiodarone lung toxicity.  CT chest  looked ok, and he was not thought to have amiodarone lung toxicity.   At a prior appointment in 2016, [name] was noted to be back in atrial fibrillation.  He was more short of breath with exertion and fatigued.  He has historically tolerated atrial fibrillation poorly.  I did a TEE-guided DCCV in 5/16 back to NSR.  TEE showed EF 35-40%.  He did not immediately feel better like he has in the past post-DCCV, so Lasix was increased.    Recurrent symptomatic atrial fibrillation in 8/17 with TEE-guided DCCV to NSR.  TEE showed EF 25-30%.  Recurrent symptomatic atrial fibrillation with CHF exacerbation in 9/17.  He had repeat DCCV and Lasix was increased.   He was back in atrial fibrillation in 11/17 and appears to have been out of rhythm since then.  He was more short of breath in atrial fibrillation with a significant fall in BiV pacing percentage.  Lasix was significantly increased to try to cope with volume overload.  In 2/18, he had AV nodal ablation to promote BiV pacing.    Admitted 5/19 with right eye lower visual field loss, found to have severe right carotid stenosis. S/p R CEA on 07/26/17. Switched from coumadin to Eliquis. Volume was stable.  Echo in 5/19 showed Ef 30-35%, mild AI.   Today, he presents for followup of CHF. He has stable exertional dyspnea.  He can ride his stationary bike without trouble. He gets short of breath walking up stairs  or a hill.  No orthopnea/PND.  No chest pain.  He had a high rate event noted on device interrogation in mid-November for about 2 minutes.  He does not remember any palpitations or lightheadedness.  This may have been VT.  Weight is down 1 lb.    Labs (5/19): LDL 72, HDL 41, K 4.2, creatinine 0.86 Labs (10/19): K 3.9, creatinine 0.96  St Jude device interrogation:  98% BiV pacing, 1 high rate event for about 2 minutes, ?VT  PMH: 1. Low back pain 2. Hyperlipidemia 3. GERD 4. BPH 5. Polymyalgia rheumatica 6. Atrial fibrillation: Failed  Tikosyn in the past.  Paroxysmal, on amiodarone and warfarin.  DCCV in 4/13, 7/13, 10/13.  Has held NSR with amiodarone.  However, concern for amiodarone lung toxicity: PFTs (4/15) with FVC 90% predicted, FEV1 90%, ratio 97%, DLCO 50%.   Recurrence of atrial fibrillation in 5/16, TEE-guided DCCV back to NSR in 5/16.  - Recurrence of atrial fibrillation in 8/17. TEE-guided DCCV 11/07/15.  - Recurrence of atrial fibrillation 9/17.  DCCV 11/20/15 but back in atrial fibrillation by 11/26/15.  - Amiodarone stopped due to persistent atrial fibrillation.  - AV nodal ablation in 2/18 to promote BiV pacing.  7. Transaminitis: Mild, attributed to amiodarone.  8. Complete heart block s/p St Jude CRT-P device.  Patient developed PCM pocket infection in 4/14 and had his first system removed with placement of a temporary permanent PCM.  He later had CRT-P device replaced.  This was complicated by large right-sided hemorrhagic pleural effusion requiring chest tube.   9. Nonischemic cardiomyopathy: Echo (9/13) with EF 40-45%, diffuse HK, mild MR.  Mild CAD only on prior LHC.  Echo (4/14) with EF 45-50%.  Adenosine Cardiolite (5/15) with EF 36% (visually appeared higher per report), small area of scar with peri-infarct ischemia in the apex, apical lateral wall, and mid anterolateral wall. Echo (5/15) with EF 40-45%, basal to mid inferolateral severe hypokinesis, basal to mid anterolateral hypokinesis, mildly dilated RV with mildly decreased RV systolic function, mild to moderate MR.  TEE (5/16) with EF 35-40%, inferior and inferolateral severe hypokinesis, normal RV size with mildly decreased systolic function, moderate MR, peak RV-RA gradient 26 mmHg.  - TEE (8/17): EF 25-30%, mildly dilated RV with mildly decreased systolic function, moderate biatrial enlargement, moderate central MR.  - AV nodal ablation 2/18.  - Echo (5/19): EF 30-35%, mild AI.  10. Large right pleural effusion requiring chest tube following removal and  reimplantation of PCM. Most recent chest CT in 6/15 showed significant decrease in size of loculated pleural effusion since 5/14.   11. Right CEA 5/19.  12. Right central retinal artery occlusion in 5/19.   SH: Married, prior smoker (quit 1985), no ETOH x years, retired, lives at [employer]  [place]: Father with MI at 58, mother with AAA.   Review of systems complete and found to be negative unless listed in HPI.   Current Outpatient Medications  Medication Sig Dispense Refill  . acetaminophen (TYLENOL) 500 MG tablet Take 1,000 mg by mouth every 6 (six) hours as needed for moderate pain or headache.    Marland Kitchen apixaban (ELIQUIS) 5 MG TABS tablet Take 1 tablet (5 mg total) by mouth 2 (two) times daily. 180 tablet 3  . dutasteride (AVODART) 0.5 MG capsule Take 0.5 mg by mouth daily.     . fluticasone (FLONASE) 50 MCG/ACT nasal spray Place 2 sprays into both nostrils daily as needed for allergies or rhinitis. (Patient taking differently: Place  2 sprays into both nostrils daily. ) 16 g 3  . furosemide (LASIX) 80 MG tablet Take 80mg  every morning and 40mg  every other evening. 135 tablet 3  . hydroxypropyl methylcellulose / hypromellose (ISOPTO TEARS / GONIOVISC) 2.5 % ophthalmic solution Place 1 drop into both eyes 3 (three) times daily as needed for dry eyes.     Marland Kitchen losartan (COZAAR) 25 MG tablet Take 12.5 mg by mouth daily.    . metoprolol succinate (TOPROL-XL) 25 MG 24 hr tablet Take 1 tablet (25 mg total) by mouth daily. Please call for office visit [phone] 45 tablet 2  . potassium chloride SA (KLOR-CON M20) 20 MEQ tablet Take 1 tablet (20 mEq total) by mouth 2 (two) times daily. 180 tablet 2  . rosuvastatin (CRESTOR) 10 MG tablet Take 1 tablet (10 mg total) by mouth at bedtime. 90 tablet 3  . spironolactone (ALDACTONE) 25 MG tablet Take 12.5 mg by mouth daily.    . tamsulosin (FLOMAX) 0.4 MG CAPS capsule TAKE 1 CAPSULE EVERY DAY (Patient taking differently: TAKE 1 CAPSULE EVERY DAY) 90 capsule 3    No current facility-administered medications for this encounter.     BP (!) 120/58   Pulse 79   Wt 69.4 kg (153 lb)   SpO2 93%   BMI 24.69 kg/m   Wt Readings from Last 3 Encounters:  02/17/18 69.4 kg (153 lb)  12/17/17 69.9 kg (154 lb 3.2 oz)  11/07/17 68 kg (150 lb)   General: NAD Neck: No JVD, no thyromegaly or thyroid nodule.  Lungs: Clear to auscultation bilaterally with normal respiratory effort. CV: Nondisplaced PMI.  Heart regular S1/S2, no S3/S4, no murmur.  No peripheral edema.  No carotid bruit.  Normal pedal pulses.  Abdomen: Soft, nontender, no hepatosplenomegaly, no distention.  Skin: Intact without lesions or rashes.  Neurologic: Alert and oriented x 3.  Psych: Normal affect. Extremities: No clubbing or cyanosis.  HEENT: Normal.   Assessment/Plan: 1. Chronic systolic CHF:  Presumed nonischemic cardiomyopathy (prior cath with only mild CAD).  He had an adenosine Cardiolite in 5/15 with EF 36% and possible small area of apical and lateral scar with peri-infarct ischemia. He has St Jude CRT-P.  Echo 5/19 with EF 30-35%. NYHA class II symptoms, stable.  He is not volume overloaded.   - Continue Lasix 80 qam/40 qpm. BMET today.  - ? VT event in November.  I will have him increase Toprol XL to 25 mg daily.  - Continue spironolactone 12.5 mg daily.  - Continue losartan 12.5 mg daily.   2. Atrial fibrillation: Now persistent.  Had breakthrough with Tikosyn and now not able to hold NSR on amiodarone. He is now off amiodarone.  He is status post AV nodal ablation with significant improvement in symptoms and BiV pacing percentage (98% today).  - Continue apixiban 5 mg BID.  3. Hyperlipidemia: History of nonobstructive CAD and recent CEA.    - Continue Crestor. Check lipids/LFTs today.  4. Rt CEA occlusion s/p R CEA 07/26/17: He had an ulcerated, 70% stenotic plaque in the right internal carotid. Suspect this is the source of embolism for CRAO.     - Continue statin.  5.  ?VT: High rate event for about 2 minutes in mid November. This seems to have been asymptomatic.  - Increase Toprol XL as above.  - I will have him see EP for further evaluation.   Followup in 3 months.   [name], MD 02/17/2018

## 2018-02-18 ENCOUNTER — Other Ambulatory Visit (HOSPITAL_COMMUNITY): Payer: Self-pay | Admitting: Cardiology

## 2018-02-23 ENCOUNTER — Ambulatory Visit (INDEPENDENT_AMBULATORY_CARE_PROVIDER_SITE_OTHER): Payer: Medicare Other | Admitting: Internal Medicine

## 2018-02-23 ENCOUNTER — Encounter: Payer: Self-pay | Admitting: Internal Medicine

## 2018-02-23 VITALS — BP 120/60 | HR 68 | Ht 66.0 in | Wt 153.0 lb

## 2018-02-23 DIAGNOSIS — I5022 Chronic systolic (congestive) heart failure: Secondary | ICD-10-CM | POA: Diagnosis not present

## 2018-02-23 DIAGNOSIS — Z950 Presence of cardiac pacemaker: Secondary | ICD-10-CM

## 2018-02-23 DIAGNOSIS — I4891 Unspecified atrial fibrillation: Secondary | ICD-10-CM | POA: Diagnosis not present

## 2018-02-23 DIAGNOSIS — I2510 Atherosclerotic heart disease of native coronary artery without angina pectoris: Secondary | ICD-10-CM

## 2018-02-23 DIAGNOSIS — I6529 Occlusion and stenosis of unspecified carotid artery: Secondary | ICD-10-CM | POA: Diagnosis not present

## 2018-02-23 NOTE — Progress Notes (Signed)
HPI [name] is referred back today by Dr. Aundra Dubin for VT. He is a pleasant 84 yo man with an extensive cardiac history as previously outlined. He has chronic atrial fib. He has undergone biv pacing and AV node ablation. He has class 2 CHF symptoms. He has never had syncope. No edema. The episode os NSVT last month was asymptomatic.  Allergies  Allergen Reactions  . Antihistamines, Diphenhydramine-Type Other (See Comments)    Causes difficulty in ability to urinate.     Current Outpatient Medications  Medication Sig Dispense Refill  . acetaminophen (TYLENOL) 500 MG tablet Take 1,000 mg by mouth every 6 (six) hours as needed for moderate pain or headache.    Marland Kitchen apixaban (ELIQUIS) 5 MG TABS tablet Take 1 tablet (5 mg total) by mouth 2 (two) times daily. 180 tablet 3  . dutasteride (AVODART) 0.5 MG capsule Take 0.5 mg by mouth daily.     . fluticasone (FLONASE) 50 MCG/ACT nasal spray Place 2 sprays into both nostrils daily as needed for allergies or rhinitis. (Patient taking differently: Place 2 sprays into both nostrils daily. ) 16 g 3  . furosemide (LASIX) 80 MG tablet Take 80mg  every morning and 40mg  every other evening. 135 tablet 3  . hydroxypropyl methylcellulose / hypromellose (ISOPTO TEARS / GONIOVISC) 2.5 % ophthalmic solution Place 1 drop into both eyes 3 (three) times daily as needed for dry eyes.     Marland Kitchen losartan (COZAAR) 25 MG tablet TAKE 1 TABLET (25 MG TOTAL) BY MOUTH DAILY. PLEASE CALL FOR OFFICE VISIT [phone] 90 tablet 3  . metoprolol succinate (TOPROL-XL) 25 MG 24 hr tablet Take 1 tablet (25 mg total) by mouth daily. Please call for office visit [phone] 45 tablet 2  . potassium chloride SA (KLOR-CON M20) 20 MEQ tablet Take 1 tablet (20 mEq total) by mouth 2 (two) times daily. 180 tablet 2  . rosuvastatin (CRESTOR) 10 MG tablet Take 1 tablet (10 mg total) by mouth at bedtime. 90 tablet 3  . spironolactone (ALDACTONE) 25 MG tablet Take 12.5 mg by mouth daily.      . tamsulosin (FLOMAX) 0.4 MG CAPS capsule Take 0.8 mg by mouth daily.     No current facility-administered medications for this visit.      Past Medical History:  Diagnosis Date  . Arthritis    "back" (03/20/2014)  . Atrial fibrillation ([place])   . BPH (benign prostatic hypertrophy)   . Breast mass    "on both sides" (06/28/2012)  . Cardiomyopathy primary-nonischemic EF 45%  . Carotid artery occlusion   . CHF (congestive heart failure) ([place])   . Coronary artery disease   . Diverticulosis of colon without hemorrhage 01/03/2014  . Dysphagia   . Elevated liver enzymes   . Esophageal stricture   . GERD (gastroesophageal reflux disease)   . Hearing loss, mixed, bilateral   . Hx of cardiovascular stress test    Adenosine Myoview (07/2013):  Apical cap, apical lateral and mid anterolateral scar, small amount of peri-infarct ischemia, EF 36%; Medium Risk  . Hyperlipidemia   . Increased prostate specific antigen (PSA) velocity   . Internal hemorrhoids 01/03/2014  . Lumbar back pain   . Pacemaker    st jude  . Pacemaker infection ([place])    06/28/12  . Polymyalgia rheumatica ([place])   . Sick sinus syndrome ([facility])     ROS:   All systems reviewed and negative except as noted in the HPI.   Past  Surgical History:  Procedure Laterality Date  . AV NODE ABLATION N/A 04/20/2016   Procedure: AV Node Ablation;  Surgeon: Evans Lance, MD;  Location: [place] [facility];  Service: Cardiovascular;  Laterality: N/A;  . CARDIAC CATHETERIZATION  1980's   [place]  . CARDIOVERSION  06/17/2011   Procedure: CARDIOVERSION;  Surgeon: Lelon Perla, MD;  Location: [place];  Service: Cardiovascular;  Laterality: N/A;  . CARDIOVERSION  09/09/2011   Procedure: CARDIOVERSION;  Surgeon: Carlena Bjornstad, MD;  Location: [place];  Service: Cardiovascular;  Laterality: N/A;  . CARDIOVERSION  01/01/2012   Procedure: CARDIOVERSION;  Surgeon: Hillary Bow, MD;  Location: [facility] ENDOSCOPY;  Service: Cardiovascular;   Laterality: N/A;  . CARDIOVERSION N/A 08/03/2014   Procedure: CARDIOVERSION;  Surgeon: Larey Dresser, MD;  Location: [facility] ENDOSCOPY;  Service: Cardiovascular;  Laterality: N/A;  . CARDIOVERSION N/A 11/07/2015   Procedure: CARDIOVERSION;  Surgeon: Larey Dresser, MD;  Location: [facility] ENDOSCOPY;  Service: Cardiovascular;  Laterality: N/A;  . CARDIOVERSION N/A 11/20/2015   Procedure: CARDIOVERSION;  Surgeon: Larey Dresser, MD;  Location: [place];  Service: Cardiovascular;  Laterality: N/A;  . CAROTID ENDARTERECTOMY    . CATARACT EXTRACTION, BILATERAL Bilateral 1980's  . COLONOSCOPY N/A 01/03/2014   Procedure: COLONOSCOPY;  Surgeon: Gatha Mayer, MD;  Location: [facility] ENDOSCOPY;  Service: Endoscopy;  Laterality: N/A;  . ENDARTERECTOMY Right 07/26/2017   Procedure: RIGHT CAROTID  ARTERY ENDARTERECTOMY;  Surgeon: Rosetta Posner, MD;  Location: [facility] OR;  Service: Vascular;  Laterality: Right;  . ESOPHAGOGASTRODUODENOSCOPY    . GENERATOR REMOVAL Left 06/15/2012   Procedure: GENERATOR REMOVAL;  Surgeon: Evans Lance, MD;  Location: [place];  Service: Cardiovascular;  Laterality: Left;  . HEMORRHOID BANDING    . INGUINAL HERNIA REPAIR Right 2012; 03/20/2014  . INGUINAL HERNIA REPAIR Right 03/20/2014   Procedure: OPEN REPAIR OF RIGHT INGUINAL WITH MESH;  Surgeon: Donnie Mesa, MD;  Location: [place];  Service: General;  Laterality: Right;  . INSERT / REPLACE / REMOVE PACEMAKER    . INSERTION OF MESH Right 03/20/2014   Procedure: INSERTION OF MESH;  Surgeon: Donnie Mesa, MD;  Location: [place];  Service: General;  Laterality: Right;  . KNEE ARTHROSCOPY WITH LATERAL MENISECTOMY Left 12/16/2016   Procedure: KNEE ARTHROSCOPY WITH LATERAL MENISECTOMY and Chondroplasty;  Surgeon: Earlie Server, MD;  Location: [place];  Service: Orthopedics;  Laterality: Left;  . LEAD REVISION N/A 06/29/2012   Procedure: LEAD REVISION;  Surgeon: Deboraha Sprang, MD;  Location: [facility] CATH LAB;  Service: Cardiovascular;  Laterality: N/A;  .  PACEMAKER GENERATOR CHANGE N/A 06/15/2012   Procedure: PACEMAKER GENERATOR CHANGE;  Surgeon: Evans Lance, MD;  Location: [place];  Service: Cardiovascular;  Laterality: N/A;  . PACEMAKER REVISION  06/30/2012   Procedure: PACEMAKER LEAD REVISION;  Surgeon: Evans Lance, MD;  Location: [facility] CATH LAB;  Service: Cardiovascular;;  . PARACENTESIS  ~ 04/4399   complication from pacer change in 2014  . PATCH ANGIOPLASTY Right 07/26/2017   Procedure: WITH PATCH ANGIOPLASTY;  Surgeon: Rosetta Posner, MD;  Location: [place];  Service: Vascular;  Laterality: Right;  . PERMANENT PACEMAKER INSERTION N/A 06/28/2012   Procedure: PERMANENT PACEMAKER INSERTION;  Surgeon: Evans Lance, MD;  Location: [facility] CATH LAB;  Service: Cardiovascular;  Laterality: N/A;  . TEE WITHOUT CARDIOVERSION  01/01/2012   Procedure: TRANSESOPHAGEAL ECHOCARDIOGRAM (TEE);  Surgeon: Fay Records, MD;  Location: [place];  Service: Cardiovascular;  Laterality: N/A;  .  TEE WITHOUT CARDIOVERSION N/A 08/03/2014   Procedure: TRANSESOPHAGEAL ECHOCARDIOGRAM (TEE);  Surgeon: Larey Dresser, MD;  Location: [place];  Service: Cardiovascular;  Laterality: N/A;  . TEE WITHOUT CARDIOVERSION N/A 11/07/2015   Procedure: TRANSESOPHAGEAL ECHOCARDIOGRAM (TEE);  Surgeon: Larey Dresser, MD;  Location: [place];  Service: Cardiovascular;  Laterality: N/A;  . TONSILLECTOMY AND ADENOIDECTOMY  1938  . UMBILICAL HERNIA REPAIR  [phone] X 3     Family History  Problem Relation Age of Onset  . Heart attack Father 47       deceased  . AAA (abdominal aortic aneurysm) Mother        deceased AAA  . Hypertension Mother   . Other Brother        deceased at birth  . Healthy Son   . Healthy Daughter   . Diabetes Cousin      Social History   Socioeconomic History  . Marital status: Married    Spouse name: Not on file  . Number of children: 3  . Years of education: Not on file  . Highest education level: Not on file  Occupational History  .  Occupation: retired    Fish farm manager: RETIRED    Comment: Engineering geologist  Social Needs  . Financial resource strain: Not on file  . Food insecurity:    Worry: Not on file    Inability: Not on file  . Transportation needs:    Medical: Not on file    Non-medical: Not on file  Tobacco Use  . Smoking status: Former Smoker    Packs/day: 1.00    Years: 40.00    Pack years: 40.00    Types: Cigarettes    Last attempt to quit: 03/10/1983    Years since quitting: 34.9  . Smokeless tobacco: Never Used  Substance and Sexual Activity  . Alcohol use: No    Alcohol/week: 0.0 standard drinks    Comment: quit drinking 2000-? alcohol related cardiomyopathy  . Drug use: No  . Sexual activity: Not Currently  Lifestyle  . Physical activity:    Days per week: Not on file    Minutes per session: Not on file  . Stress: Not on file  Relationships  . Social connections:    Talks on phone: Not on file    Gets together: Not on file    Attends religious service: Not on file    Active member of club or organization: Not on file    Attends meetings of clubs or organizations: Not on file    Relationship status: Not on file  . Intimate partner violence:    Fear of current or ex partner: Not on file    Emotionally abused: Not on file    Physically abused: Not on file    Forced sexual activity: Not on file  Other Topics Concern  . Not on file  Social History Narrative   Married. 3 children (6 together). 5 grandkids with with 5 grandkids (2nd marriage). No greatgrandkids.    One story home.   Retired from Education officer, community: college   Hobbies: play golf, bridge, garden, write family stories     BP 120/60   Pulse 68   Ht 5\' 6"  (1.676 m)   Wt 153 lb (69.4 kg)   SpO2 94%   BMI 24.69 kg/m   Physical Exam:  Well appearing NAD HEENT: Unremarkable Neck:  No JVD, no thyromegally Lymphatics:  No adenopathy Back:  No CVA tenderness Lungs:  Clear with no  wheezes HEART:  Regular rate rhythm, no murmurs, no rubs, no clicks Abd:  soft, positive bowel sounds, no organomegally, no rebound, no guarding Ext:  2 plus pulses, no edema, no cyanosis, no clubbing Skin:  No rashes no nodules Neuro:  CN II through XII intact, motor grossly intact   DEVICE  Normal device function.  See PaceArt for details.   Assess/Plan: 1. VT - at this point and with him asymptomatic, I would recommend watchful waiting. If he had more substantial symptoms or signs, I might consider adding mexitil.  2. Atrial fib - his VR is controlled after AV node ablation. 3. PPM - he was undersensing some of his ventricular ectopy. I have increased his sensitivity. 4. Chronic systolic heart failure - his EF is 35% by echo. No indication for ICD. He will continue his diuretic therapy. I strongly encouraged him to avoid salty foods when he travels to [place] next week.  Mikle Bosworth.D.

## 2018-02-23 NOTE — Patient Instructions (Addendum)
Medication Instructions:  Your physician recommends that you continue on your current medications as directed. Please refer to the Current Medication list given to you today.  Labwork: None ordered.  Testing/Procedures: None ordered.  Follow-Up: Your physician wants you to follow-up in: 6 months with Dr. Lovena Le.   You will receive a reminder letter in the mail two months in advance. If you don't receive a letter, please call our office to schedule the follow-up appointment.  Remote monitoring is used to monitor your Pacemaker from home. This monitoring reduces the number of office visits required to check your device to one time per year. It allows [place] to keep an eye on the functioning of your device to ensure it is working properly. You are scheduled for a device check from home on 03/21/2018. You may send your transmission at any time that day. If you have a wireless device, the transmission will be sent automatically. After your physician reviews your transmission, you will receive a postcard with your next transmission date.  Any Other Special Instructions Will Be Listed Below (If Applicable).  If you need a refill on your cardiac medications before your next appointment, please call your pharmacy.

## 2018-02-25 LAB — CUP PACEART INCLINIC DEVICE CHECK
Date Time Interrogation Session: 20191220113329
Implantable Lead Implant Date: 20140422
Implantable Lead Implant Date: 20140424
Implantable Lead Location: 753859
Implantable Lead Model: 5076
Implantable Pulse Generator Implant Date: 20140422
MDC IDC LEAD IMPLANT DT: 20140424
MDC IDC LEAD LOCATION: 753858
MDC IDC LEAD LOCATION: 753860
Pulse Gen Serial Number: 2926516

## 2018-03-04 ENCOUNTER — Ambulatory Visit (HOSPITAL_COMMUNITY)
Admission: RE | Admit: 2018-03-04 | Discharge: 2018-03-04 | Disposition: A | Discharge status: Home / Self Care | Payer: Medicare Other | Source: Ambulatory Visit | Attending: Internal Medicine | Admitting: Internal Medicine

## 2018-03-04 ENCOUNTER — Other Ambulatory Visit (HOSPITAL_COMMUNITY): Payer: Medicare Other

## 2018-03-04 DIAGNOSIS — I5022 Chronic systolic (congestive) heart failure: Secondary | ICD-10-CM | POA: Insufficient documentation

## 2018-03-04 LAB — HEPATIC FUNCTION PANEL
ALBUMIN: 4 g/dL (ref 3.5–5.0)
ALT: 19 U/L (ref 0–44)
AST: 26 U/L (ref 15–41)
Alkaline Phosphatase: 62 U/L (ref 38–126)
BILIRUBIN INDIRECT: 0.9 mg/dL (ref 0.3–0.9)
BILIRUBIN TOTAL: 1.2 mg/dL (ref 0.3–1.2)
Bilirubin, Direct: 0.3 mg/dL — ABNORMAL HIGH (ref 0.0–0.2)
Total Protein: 7.1 g/dL (ref 6.5–8.1)

## 2018-03-04 LAB — LIPID PANEL
CHOLESTEROL: 116 mg/dL (ref 0–200)
HDL: 41 mg/dL (ref >40)
LDL Cholesterol: 63 mg/dL (ref 0–99)
TRIGLYCERIDES: 61 mg/dL (ref <150)
Total CHOL/HDL Ratio: 2.8 RATIO
VLDL: 12 mg/dL (ref 0–40)

## 2018-03-07 ENCOUNTER — Other Ambulatory Visit (HOSPITAL_COMMUNITY): Payer: Self-pay | Admitting: Surgery

## 2018-03-07 ENCOUNTER — Telehealth (HOSPITAL_COMMUNITY): Payer: Self-pay | Admitting: Surgery

## 2018-03-07 DIAGNOSIS — G629 Polyneuropathy, unspecified: Secondary | ICD-10-CM

## 2018-03-07 MED ORDER — POTASSIUM CHLORIDE CRYS ER 20 MEQ PO TBCR: 20.0000 meq | EXTENDED_RELEASE_TABLET | Freq: Two times a day (BID) | ORAL | 2 refills | Status: DC

## 2018-03-07 NOTE — Telephone Encounter (Signed)
Refill sent for Potassium to [facility] pharmacy of choice.

## 2018-03-07 NOTE — Telephone Encounter (Addendum)
I called patient to inform him that Dr. Aundra Dubin advised that he be referred to Neurology for evaluation.  He is in agreement and will await them to contact him to schedule an appt.

## 2018-03-09 HISTORY — PX: MELANOMA EXCISION: SHX5266

## 2018-03-15 DIAGNOSIS — M5416 Radiculopathy, lumbar region: Secondary | ICD-10-CM | POA: Diagnosis not present

## 2018-03-16 DIAGNOSIS — N401 Enlarged prostate with lower urinary tract symptoms: Secondary | ICD-10-CM | POA: Diagnosis not present

## 2018-03-16 DIAGNOSIS — R3914 Feeling of incomplete bladder emptying: Secondary | ICD-10-CM | POA: Diagnosis not present

## 2018-03-17 ENCOUNTER — Encounter: Payer: Self-pay | Admitting: Family Medicine

## 2018-03-21 ENCOUNTER — Other Ambulatory Visit (HOSPITAL_COMMUNITY): Payer: Self-pay

## 2018-03-21 MED ORDER — ROSUVASTATIN CALCIUM 10 MG PO TABS: 10.0000 mg | ORAL_TABLET | Freq: Every day | ORAL | 3 refills | Status: DC

## 2018-03-23 ENCOUNTER — Telehealth: Payer: Self-pay

## 2018-03-23 NOTE — Telephone Encounter (Signed)
No contact - unable to leave message

## 2018-03-25 ENCOUNTER — Encounter: Payer: Self-pay | Admitting: Cardiology

## 2018-04-01 ENCOUNTER — Encounter: Payer: Self-pay | Admitting: *Deleted

## 2018-04-05 ENCOUNTER — Other Ambulatory Visit (HOSPITAL_COMMUNITY): Payer: Self-pay

## 2018-04-05 MED ORDER — LOSARTAN POTASSIUM 25 MG PO TABS: 12.5000 mg | ORAL_TABLET | Freq: Every day | ORAL | 3 refills | Status: DC

## 2018-04-06 ENCOUNTER — Ambulatory Visit (INDEPENDENT_AMBULATORY_CARE_PROVIDER_SITE_OTHER): Payer: Medicare Other | Admitting: Gastroenterology

## 2018-04-06 ENCOUNTER — Telehealth: Payer: Self-pay | Admitting: *Deleted

## 2018-04-06 ENCOUNTER — Encounter: Payer: Self-pay | Admitting: Gastroenterology

## 2018-04-06 VITALS — BP 100/54 | HR 76 | Ht 66.0 in | Wt 151.0 lb

## 2018-04-06 DIAGNOSIS — R150 Incomplete defecation: Secondary | ICD-10-CM | POA: Diagnosis not present

## 2018-04-06 DIAGNOSIS — R159 Full incontinence of feces: Secondary | ICD-10-CM | POA: Diagnosis not present

## 2018-04-06 NOTE — Progress Notes (Addendum)
04/06/2018 [name] [id number] February 21, 1930   HISTORY OF PRESENT ILLNESS:  This is an 85 year old male who is a patient of Dr. Celesta Aver.  He is here again today with complaints of fecal incontinence and sensation of incomplete evaluation of stools.  Dr. Carlean Purl has banded all 3 columns of hemorrhoids twice, in 2016 and 2018. He noted improvement with that, but was interested in re-banding again when he saw Dr. Carlean Purl in 01/2017.  Dr. Carlean Purl convinced him to try pelvic floor PT first.  He says that he did do the pelvic floor physical therapy and that did seem to help.  He has continued his exercises as well.  His stools are soft but formed he says, but he does not feel like he can have complete defecation and has to visit the restroom to move his bowels 4 or 5 times throughout the day.  He has only seen some red blood on the tissue paper upon wiping on 1 or 2 occasions.  He was previously on Coumadin for atrial fibrillation, but is now taking Eliquis instead that is prescribed by Dr. Aundra Dubin.  Colonoscopy in 2015 did not reveal any problems related to this or any neoplasia. He did have some diverticulosis.  He is interested in attempting rebanding.    Past Medical History:  Diagnosis Date  . Arthritis    "back" (03/20/2014)  . Atrial fibrillation ([place])   . BPH (benign prostatic hypertrophy)   . Breast mass    "on both sides" (06/28/2012)  . Cardiomyopathy primary-nonischemic EF 45%  . Carotid artery occlusion   . CHF (congestive heart failure) ([place])   . Coronary artery disease   . Diverticulosis of colon without hemorrhage 01/03/2014  . Dysphagia   . Elevated liver enzymes   . Esophageal stricture   . GERD (gastroesophageal reflux disease)   . Hearing loss, mixed, bilateral   . Hiatal hernia   . Hx of cardiovascular stress test    Adenosine Myoview (07/2013):  Apical cap, apical lateral and mid anterolateral scar, small amount of peri-infarct ischemia, EF 36%; Medium Risk  .  Hyperlipidemia   . Increased prostate specific antigen (PSA) velocity   . Internal hemorrhoids 01/03/2014  . Lumbar back pain   . Pacemaker    st jude  . Pacemaker infection ([place])    06/28/12  . Polymyalgia rheumatica ([place])   . Sick sinus syndrome [facility])    Past Surgical History:  Procedure Laterality Date  . AV NODE ABLATION N/A 04/20/2016   Procedure: AV Node Ablation;  Surgeon: Evans Lance, MD;  Location: [place] [facility];  Service: Cardiovascular;  Laterality: N/A;  . CARDIAC CATHETERIZATION  1980's   [place]  . CARDIOVERSION  06/17/2011   Procedure: CARDIOVERSION;  Surgeon: Lelon Perla, MD;  Location: [place];  Service: Cardiovascular;  Laterality: N/A;  . CARDIOVERSION  09/09/2011   Procedure: CARDIOVERSION;  Surgeon: Carlena Bjornstad, MD;  Location: [place];  Service: Cardiovascular;  Laterality: N/A;  . CARDIOVERSION  01/01/2012   Procedure: CARDIOVERSION;  Surgeon: Hillary Bow, MD;  Location: [facility] ENDOSCOPY;  Service: Cardiovascular;  Laterality: N/A;  . CARDIOVERSION N/A 08/03/2014   Procedure: CARDIOVERSION;  Surgeon: Larey Dresser, MD;  Location: [facility] ENDOSCOPY;  Service: Cardiovascular;  Laterality: N/A;  . CARDIOVERSION N/A 11/07/2015   Procedure: CARDIOVERSION;  Surgeon: Larey Dresser, MD;  Location: [place];  Service: Cardiovascular;  Laterality: N/A;  . CARDIOVERSION N/A 11/20/2015   Procedure: CARDIOVERSION;  Surgeon: Larey Dresser, MD;  Location: [place];  Service: Cardiovascular;  Laterality: N/A;  . CAROTID ENDARTERECTOMY    . CATARACT EXTRACTION, BILATERAL Bilateral 1980's  . COLONOSCOPY N/A 01/03/2014   Procedure: COLONOSCOPY;  Surgeon: Gatha Mayer, MD;  Location: [facility] ENDOSCOPY;  Service: Endoscopy;  Laterality: N/A;  . ENDARTERECTOMY Right 07/26/2017   Procedure: RIGHT CAROTID  ARTERY ENDARTERECTOMY;  Surgeon: Rosetta Posner, MD;  Location: [facility] OR;  Service: Vascular;  Laterality: Right;  . ESOPHAGOGASTRODUODENOSCOPY    . GENERATOR REMOVAL Left  06/15/2012   Procedure: GENERATOR REMOVAL;  Surgeon: Evans Lance, MD;  Location: [place];  Service: Cardiovascular;  Laterality: Left;  . HEMORRHOID BANDING    . INGUINAL HERNIA REPAIR Right 2012; 03/20/2014  . INGUINAL HERNIA REPAIR Right 03/20/2014   Procedure: OPEN REPAIR OF RIGHT INGUINAL WITH MESH;  Surgeon: Donnie Mesa, MD;  Location: [place];  Service: General;  Laterality: Right;  . INSERT / REPLACE / REMOVE PACEMAKER    . INSERTION OF MESH Right 03/20/2014   Procedure: INSERTION OF MESH;  Surgeon: Donnie Mesa, MD;  Location: [place];  Service: General;  Laterality: Right;  . KNEE ARTHROSCOPY WITH LATERAL MENISECTOMY Left 12/16/2016   Procedure: KNEE ARTHROSCOPY WITH LATERAL MENISECTOMY and Chondroplasty;  Surgeon: Earlie Server, MD;  Location: [place];  Service: Orthopedics;  Laterality: Left;  . LEAD REVISION N/A 06/29/2012   Procedure: LEAD REVISION;  Surgeon: Deboraha Sprang, MD;  Location: [facility] CATH LAB;  Service: Cardiovascular;  Laterality: N/A;  . PACEMAKER GENERATOR CHANGE N/A 06/15/2012   Procedure: PACEMAKER GENERATOR CHANGE;  Surgeon: Evans Lance, MD;  Location: [place];  Service: Cardiovascular;  Laterality: N/A;  . PACEMAKER REVISION  06/30/2012   Procedure: PACEMAKER LEAD REVISION;  Surgeon: Evans Lance, MD;  Location: [facility] CATH LAB;  Service: Cardiovascular;;  . PARACENTESIS  ~ 05/8099   complication from pacer change in 2014  . PATCH ANGIOPLASTY Right 07/26/2017   Procedure: WITH PATCH ANGIOPLASTY;  Surgeon: Rosetta Posner, MD;  Location: [place];  Service: Vascular;  Laterality: Right;  . PERMANENT PACEMAKER INSERTION N/A 06/28/2012   Procedure: PERMANENT PACEMAKER INSERTION;  Surgeon: Evans Lance, MD;  Location: [facility] CATH LAB;  Service: Cardiovascular;  Laterality: N/A;  . TEE WITHOUT CARDIOVERSION  01/01/2012   Procedure: TRANSESOPHAGEAL ECHOCARDIOGRAM (TEE);  Surgeon: Fay Records, MD;  Location: [facility] ENDOSCOPY;  Service: Cardiovascular;  Laterality: N/A;  . TEE WITHOUT  CARDIOVERSION N/A 08/03/2014   Procedure: TRANSESOPHAGEAL ECHOCARDIOGRAM (TEE);  Surgeon: Larey Dresser, MD;  Location: [place];  Service: Cardiovascular;  Laterality: N/A;  . TEE WITHOUT CARDIOVERSION N/A 11/07/2015   Procedure: TRANSESOPHAGEAL ECHOCARDIOGRAM (TEE);  Surgeon: Larey Dresser, MD;  Location: [place];  Service: Cardiovascular;  Laterality: N/A;  . TONSILLECTOMY AND ADENOIDECTOMY  1938  . UMBILICAL HERNIA REPAIR  [phone] X 3    reports that he quit smoking about 35 years ago. His smoking use included cigarettes. He has a 40.00 pack-year smoking history. He has never used smokeless tobacco. He reports that he does not drink alcohol or use drugs. family history includes AAA (abdominal aortic aneurysm) in his mother; Diabetes in his cousin; Healthy in his daughter and son; Heart attack (age of onset: 75) in his father; Hypertension in his mother; Other in his brother. Allergies  Allergen Reactions  . Antihistamines, Diphenhydramine-Type Other (See Comments)    Causes difficulty in ability to urinate.      Outpatient Encounter Medications as of  04/06/2018  Medication Sig  . acetaminophen (TYLENOL) 500 MG tablet Take 1,000 mg by mouth every 6 (six) hours as needed for moderate pain or headache.  Marland Kitchen apixaban (ELIQUIS) 5 MG TABS tablet Take 1 tablet (5 mg total) by mouth 2 (two) times daily.  Marland Kitchen dutasteride (AVODART) 0.5 MG capsule Take 0.5 mg by mouth daily.   . fluticasone (FLONASE) 50 MCG/ACT nasal spray Place 2 sprays into both nostrils daily as needed for allergies or rhinitis. (Patient taking differently: Place 2 sprays into both nostrils daily. )  . furosemide (LASIX) 80 MG tablet Take 80mg  every morning and 40mg  every other evening.  . hydroxypropyl methylcellulose / hypromellose (ISOPTO TEARS / GONIOVISC) 2.5 % ophthalmic solution Place 1 drop into both eyes 3 (three) times daily as needed for dry eyes.   Marland Kitchen losartan (COZAAR) 25 MG tablet Take 0.5 tablets (12.5 mg  total) by mouth daily.  . metoprolol succinate (TOPROL-XL) 25 MG 24 hr tablet Take 1 tablet (25 mg total) by mouth daily. Please call for office visit [phone]  . potassium chloride SA (KLOR-CON M20) 20 MEQ tablet Take 1 tablet (20 mEq total) by mouth 2 (two) times daily.  . rosuvastatin (CRESTOR) 10 MG tablet Take 1 tablet (10 mg total) by mouth at bedtime.  Marland Kitchen spironolactone (ALDACTONE) 25 MG tablet Take 12.5 mg by mouth daily.  . tamsulosin (FLOMAX) 0.4 MG CAPS capsule Take 0.4 mg by mouth 2 (two) times daily.    No facility-administered encounter medications on file as of 04/06/2018.      REVIEW OF SYSTEMS  : All other systems reviewed and negative except where noted in the History of Present Illness.   PHYSICAL EXAM: BP (!) 100/54   Pulse 76   Ht 5\' 6"  (1.676 m)   Wt 151 lb (68.5 kg)   BMI 24.37 kg/m  General: Well developed white male in no acute distress Head: Normocephalic and atraumatic Eyes:  Sclerae anicteric, conjunctiva pink. Ears: Normal auditory acuity Lungs: Clear throughout to auscultation; no increased WOB. Heart: Regular rate and rhythm; no M/R/G. Abdomen: Soft, non-distended.  BS present.  Non-tender. Musculoskeletal: Symmetrical with no gross deformities  Skin: No lesions on visible extremities Extremities: No edema  Neurological: Alert oriented x 4, grossly non-focal Psychological:  Alert and cooperative. Normal mood and affect  ASSESSMENT AND PLAN: *Fecal incontinence with incomplete evacuation of stools and history of internal hemorrhoids:  Has had hemorrhoid bandings of all 3 columns x 2.  Then also did pelvic floor PT, which he thinks that helped, but recently has had some issues again.  Feels that he need hemorrhoid banding again and did have Grade 2 hemorrhoids on his las anoscopic exam.  Will schedule with Dr. Carlean Purl, but I have asked him to also begin taking Benefiber powder in 8 ounces of liquid daily to act as a bulking agent.  He will continue  his PT exercises as well. *Chronic anticoagulation with Eliquis:  Will likely need to be held, but will check with Dr. Carlean Purl on preference (to hold before or after banding).  Then will likely need to approve with Dr. Aundra Dubin.   CC:  [name], MD  Will reassess re: possible repeat hemorrhoidal banding  Gatha Mayer, MD, [facility]

## 2018-04-06 NOTE — Telephone Encounter (Signed)
Dr Carlean Purl- Do you typically hold anticoags before hemorrhoidal bandings or after?

## 2018-04-06 NOTE — Patient Instructions (Addendum)
You have been scheduled for hemorrhoidal banding with Dr Carlean Purl on 04/26/2018 at 3:00 pm.  We will get clearance for you to hold eliquis for procedure. PLEASE DO NOT stop this medication on your own. If you have not heard from [place] at least 1 week prior to your test, please call our office at [phone].  Please purchase the following medications over the counter and take as directed: Benefiber powder-2 teaspoons in 8 ounces water/juice daily.  If you are age 58 or older, your body mass index should be between 23-30. Your Body mass index is 24.37 kg/m. If this is out of the aforementioned range listed, please consider follow up with your Primary Care Provider.  If you are age 42 or younger, your body mass index should be between 19-25. Your Body mass index is 24.37 kg/m. If this is out of the aformentioned range listed, please consider follow up with your Primary Care Provider.

## 2018-04-07 ENCOUNTER — Other Ambulatory Visit (HOSPITAL_COMMUNITY): Payer: Self-pay

## 2018-04-07 MED ORDER — APIXABAN 5 MG PO TABS: 5.0000 mg | ORAL_TABLET | Freq: Two times a day (BID) | ORAL | 2 refills | Status: DC

## 2018-04-07 NOTE — Telephone Encounter (Signed)
In this case he can  take Eliquis day of procedure and I will hold 5 days after any banding if I band so we need to check with Dr. Benjamine Mola on that

## 2018-04-07 NOTE — Telephone Encounter (Signed)
Request for surgical clearance:     Endoscopy Procedure  What type of surgery is being performed?     Hemorrhoidal banding  When is this surgery scheduled?     04/26/2018  What type of clearance is required ?   Pharmacy  Are there any medications that need to be held prior to surgery and how long? Eliquis 5 days AFTER banding  Practice name and name of physician performing surgery?      [facility]  What is your office phone and fax number?      Phone- [phone]  [phone]  Anesthesia type (None, local, MAC, general) ?       MAC

## 2018-04-08 NOTE — Telephone Encounter (Signed)
Left message for patient to call back  

## 2018-04-08 NOTE — Telephone Encounter (Signed)
Patient with diagnosis of afib on Eliquis for anticoagulation.    Procedure: Hemorrhoidal banding Date of procedure: 04/26/2018  CHADS2-VASc score of  4 (CHF, HTN, AGE, DM2, stroke/tia x 2, CAD, AGE, male)  [place] to hold for 5 days after banding per request of the GI doctor. I would consider not taking dose the morning of.

## 2018-04-11 NOTE — Telephone Encounter (Signed)
I have spoken to patient to advise that he may take Eliquis day of procedure and hold x 5 days after procedure (Dr Carlean Purl). HOWEVER, I have also seen where cardiology notes they would "consider not taking dose the morning of".   Dr Carlean Purl, do you want me to leave it as I have already instructed patient that he can take Eliquis day of procedure, or do you want me to tell him he can actually hold it day of as well?

## 2018-04-11 NOTE — Telephone Encounter (Signed)
OK toi hold AM of appointment also

## 2018-04-11 NOTE — Telephone Encounter (Signed)
Left message for patient to call back  

## 2018-04-12 DIAGNOSIS — H349 Unspecified retinal vascular occlusion: Secondary | ICD-10-CM | POA: Diagnosis not present

## 2018-04-12 NOTE — Telephone Encounter (Signed)
I have spoken to patient to advise that he should actually hold his Eliquis the morning of his hemorrhoidal banding and for 5 days after procedure. Patient verbalizes understanding of updated instructions.

## 2018-04-13 ENCOUNTER — Telehealth: Payer: Self-pay | Admitting: Internal Medicine

## 2018-04-13 NOTE — Telephone Encounter (Signed)
Patient just needed to reschedule the appt.  He is rescheduled to 05/03/18

## 2018-04-13 NOTE — Telephone Encounter (Signed)
PT has a appt on 2-18 for hemorrhoids banding and has some questions

## 2018-04-20 ENCOUNTER — Other Ambulatory Visit (HOSPITAL_COMMUNITY): Payer: Self-pay

## 2018-04-20 MED ORDER — LOSARTAN POTASSIUM 25 MG PO TABS: 12.5000 mg | ORAL_TABLET | Freq: Every day | ORAL | 3 refills | Status: DC

## 2018-04-26 ENCOUNTER — Encounter: Payer: Medicare Other | Admitting: Internal Medicine

## 2018-05-03 ENCOUNTER — Encounter: Payer: Self-pay | Admitting: Internal Medicine

## 2018-05-03 ENCOUNTER — Ambulatory Visit (INDEPENDENT_AMBULATORY_CARE_PROVIDER_SITE_OTHER): Payer: Medicare Other | Admitting: Internal Medicine

## 2018-05-03 DIAGNOSIS — K641 Second degree hemorrhoids: Secondary | ICD-10-CM

## 2018-05-03 NOTE — Progress Notes (Signed)
   The patient is here for repeat hemorrhoidal ligation.  He has suffered with problems with fecal smearing and soiling and has been banded twice in 2017 and 2018 with success and.  He has a sensation of persistent need to defecate and swelling in the rectum which he thinks is his hemorrhoids.  Fiber supplementation and pelvic floor physical therapy have helped but he has persistent issues and has requested repeat banding.  He takes Eliquis as an anticoagulant because of atrial fibrillation.  We have clarified with cardiology about holding this for several days and the patient understands and accepts the risks of possible stroke off of Eliquis.   Digital rectal exam reveals no mass nontender.  Anoscopy demonstrates grade 2 right posterior left lateral internal hemorrhoids with a smaller right anterior internal hemorrhoid.  PROCEDURE NOTE: The patient presents with symptomatic grade 2 hemorrhoids, requesting rubber band ligation of his/her hemorrhoidal disease.  All risks, benefits and alternative forms of therapy were described and informed consent was obtained.   The anorectum was pre-medicated with 5% topical lidocaine The decision was made to band all 3 internal hemorrhoid columns, and the [place] was used to perform band ligation without complication.  Digital anorectal examination was then performed to assure proper positioning of the band, and to adjust the banded tissue as required.  The patient was discharged home without pain or other issues.  Dietary and behavioral recommendations were given and along with follow-up instructions.     The following adjunctive treatments were recommended:  Continue fiber therapy He will hold his Eliquis and resume it on March 2.  The patient will call back in 1 month with a symptom update at which time I can decide upon follow-up and possible additional banding as required. No complications were encountered and the patient tolerated the  procedure well.  CC: [name], MD

## 2018-05-03 NOTE — Assessment & Plan Note (Addendum)
All 3 columns banded  Hold Eliquis for 5 days then resume Call me in 1 month with an update to determine future treatment if any

## 2018-05-03 NOTE — Patient Instructions (Signed)
HEMORRHOID BANDING PROCEDURE    FOLLOW-UP CARE   1. The procedure you have had should have been relatively painless since the banding of the area involved does not have nerve endings and there is no pain sensation.  The rubber band cuts off the blood supply to the hemorrhoid and the band may fall off as soon as 48 hours after the banding (the band may occasionally be seen in the toilet bowl following a bowel movement). You may notice a temporary feeling of fullness in the rectum which should respond adequately to plain Tylenol or Motrin.  2. Following the banding, avoid strenuous exercise that evening and resume full activity the next day.  A sitz bath (soaking in a warm tub) or bidet is soothing, and can be useful for cleansing the area after bowel movements.     3. To avoid constipation, take two tablespoons of natural wheat bran, natural oat bran, flax, Benefiber or any over the counter fiber supplement and increase your water intake to 7-8 glasses daily.    4. Unless you have been prescribed anorectal medication, do not put anything inside your rectum for two weeks: No suppositories, enemas, fingers, etc.  5. Occasionally, you may have more bleeding than usual after the banding procedure.  This is often from the untreated hemorrhoids rather than the treated one.  Don't be concerned if there is a tablespoon or so of blood.  If there is more blood than this, lie flat with your bottom higher than your head and apply an ice pack to the area. If the bleeding does not stop within a half an hour or if you feel faint, call our office at [phone] or go to the emergency room.  6. Problems are not common; however, if there is a substantial amount of bleeding, severe pain, chills, fever or difficulty passing urine (very rare) or other problems, you should call us at [phone] or report to the nearest emergency room.  7. Do not stay seated continuously for more than 2-3 hours for a day or two  after the procedure.  Tighten your buttock muscles 10-15 times every two hours and take 10-15 deep breaths every 1-2 hours.  Do not spend more than a few minutes on the toilet if you cannot empty your bowel; instead re-visit the toilet at a later time.    CALL us IN A MONTH WITH AN UPDATE.   RE-START YOUR ELIQUIS ON MARCH 2ND 2020 PER DR GESSNER.   I appreciate the opportunity to care for you. Silvano Rusk, MD, [facility]

## 2018-05-10 ENCOUNTER — Ambulatory Visit (INDEPENDENT_AMBULATORY_CARE_PROVIDER_SITE_OTHER): Payer: Medicare Other | Admitting: *Deleted

## 2018-05-10 ENCOUNTER — Ambulatory Visit (HOSPITAL_COMMUNITY)
Admission: RE | Admit: 2018-05-10 | Discharge: 2018-05-10 | Disposition: A | Discharge status: Home / Self Care | Payer: Medicare Other | Source: Ambulatory Visit | Attending: Cardiology | Admitting: Cardiology

## 2018-05-10 ENCOUNTER — Encounter (HOSPITAL_COMMUNITY): Payer: Self-pay | Admitting: Cardiology

## 2018-05-10 VITALS — BP 100/62 | HR 48 | Wt 155.8 lb

## 2018-05-10 DIAGNOSIS — I2510 Atherosclerotic heart disease of native coronary artery without angina pectoris: Secondary | ICD-10-CM | POA: Diagnosis not present

## 2018-05-10 DIAGNOSIS — I495 Sick sinus syndrome: Secondary | ICD-10-CM | POA: Diagnosis not present

## 2018-05-10 DIAGNOSIS — I6521 Occlusion and stenosis of right carotid artery: Secondary | ICD-10-CM | POA: Diagnosis not present

## 2018-05-10 DIAGNOSIS — Z79899 Other long term (current) drug therapy: Secondary | ICD-10-CM | POA: Insufficient documentation

## 2018-05-10 DIAGNOSIS — E785 Hyperlipidemia, unspecified: Secondary | ICD-10-CM | POA: Insufficient documentation

## 2018-05-10 DIAGNOSIS — M545 Low back pain: Secondary | ICD-10-CM | POA: Insufficient documentation

## 2018-05-10 DIAGNOSIS — I4891 Unspecified atrial fibrillation: Secondary | ICD-10-CM | POA: Diagnosis not present

## 2018-05-10 DIAGNOSIS — J90 Pleural effusion, not elsewhere classified: Secondary | ICD-10-CM | POA: Insufficient documentation

## 2018-05-10 DIAGNOSIS — I4819 Other persistent atrial fibrillation: Secondary | ICD-10-CM | POA: Insufficient documentation

## 2018-05-10 DIAGNOSIS — I5022 Chronic systolic (congestive) heart failure: Secondary | ICD-10-CM | POA: Insufficient documentation

## 2018-05-10 DIAGNOSIS — M353 Polymyalgia rheumatica: Secondary | ICD-10-CM | POA: Insufficient documentation

## 2018-05-10 DIAGNOSIS — Z8249 Family history of ischemic heart disease and other diseases of the circulatory system: Secondary | ICD-10-CM | POA: Diagnosis not present

## 2018-05-10 DIAGNOSIS — Z950 Presence of cardiac pacemaker: Secondary | ICD-10-CM | POA: Insufficient documentation

## 2018-05-10 DIAGNOSIS — Z7901 Long term (current) use of anticoagulants: Secondary | ICD-10-CM | POA: Insufficient documentation

## 2018-05-10 DIAGNOSIS — Z87891 Personal history of nicotine dependence: Secondary | ICD-10-CM | POA: Insufficient documentation

## 2018-05-10 LAB — BASIC METABOLIC PANEL
Anion gap: 7 (ref 5–15)
BUN: 21 mg/dL (ref 8–23)
CO2: 26 mmol/L (ref 22–32)
Calcium: 8.9 mg/dL (ref 8.9–10.3)
Chloride: 106 mmol/L (ref 98–111)
Creatinine, Ser: 1.05 mg/dL (ref 0.61–1.24)
Glucose, Bld: 95 mg/dL (ref 70–99)
Potassium: 3.8 mmol/L (ref 3.5–5.1)
SODIUM: 139 mmol/L (ref 135–145)

## 2018-05-10 LAB — TSH: TSH: 1.873 u[IU]/mL (ref 0.350–4.500)

## 2018-05-10 LAB — VITAMIN B12: VITAMIN B 12: 320 pg/mL (ref 180–914)

## 2018-05-10 MED ORDER — SPIRONOLACTONE 25 MG PO TABS: 25.0000 mg | ORAL_TABLET | Freq: Every day | ORAL | 3 refills | Status: DC

## 2018-05-10 NOTE — Patient Instructions (Signed)
INCREASE Spironolactone to 25mg  daily  Routine lab work today. Will notify you of abnormal results  Repeat labs in 10 days  Follow up and echo in 3 months.

## 2018-05-10 NOTE — Progress Notes (Signed)
Advanced Heart Failure Clinic Note  Patient ID: [name], male   DOB: 07-22-1929, 85 y.o.   MRN: [id number] PCP: Dr. Yong [name] Cardiology: Dr. Gerarda [name] is a 85 y.o. male with history of paroxysmal atrial fibrillation, nonischemic cardiomyopathy, and complete heart block with St Jude CRT-P system presents for cardiology followup.  He was admitted in 4/14 with pacemaker pocket infection.  His pacemaker was removed and temporary-permanent device was placed.  Later, he had the temporary-permanent device removed and a new CRT-P device was placed. He developed dyspnea post-operatively and was found to have a large right-sided pleural effusion.  He was admitted and got a chest tube for drainage. He had followup with Dr. Servando [name] and it was decided that he would not need VATS.  He had been on amiodarone for maintenance of NSR.  This had been more successful than Tikosyn.   Around 3/15, he started developing increasing exertional dyspnea.  He was short of breath walking up a hill or incline.  He could still walk on the treadmill for exercise for 10-15 minutes and use the elliptical without much trouble on most days.  No orthopnea or PND.  No chest pain.  He saw Dr [name] and was started on Lasix three times a week due to concern for volume overload.  This did not seem to have helped much.  After that, he had PFTs done which showed normal spirometry but DLCO 50% predicted.  Therefore, amiodarone was stopped due to concern for lung toxicity.  He saw [name] for a pre-operative evaluation prior to right inguinal hernia repair.  Given the exertional dyspnea, he was set up for adenosine Cardiolite in 5/15.  This showed EF 36% with a small area of primarily scar in the apex, apical lateral wall, and mid anterolateral wall.  He has not felt palpitations. Echo in 5/15 showed EF 40-45%, similar to prior study.   I had him see pulmonary for evaluation for amiodarone lung toxicity.  CT chest  looked ok, and he was not thought to have amiodarone lung toxicity.   At a prior appointment in 2016, [name] was noted to be back in atrial fibrillation.  He was more short of breath with exertion and fatigued.  He has historically tolerated atrial fibrillation poorly.  I did a TEE-guided DCCV in 5/16 back to NSR.  TEE showed EF 35-40%.  He did not immediately feel better like he has in the past post-DCCV, so Lasix was increased.    Recurrent symptomatic atrial fibrillation in 8/17 with TEE-guided DCCV to NSR.  TEE showed EF 25-30%.  Recurrent symptomatic atrial fibrillation with CHF exacerbation in 9/17.  He had repeat DCCV and Lasix was increased.   He was back in atrial fibrillation in 11/17 and appears to have been out of rhythm since then.  He was more short of breath in atrial fibrillation with a significant fall in BiV pacing percentage.  Lasix was significantly increased to try to cope with volume overload.  In 2/18, he had AV nodal ablation to promote BiV pacing.    Admitted 5/19 with right eye lower visual field loss, found to have severe right carotid stenosis. S/p R CEA on 07/26/17. Switched from coumadin to Eliquis. Volume was stable.  Echo in 5/19 showed EF 30-35%, mild AI.   Today, he presents for followup of CHF. He was recently in the mountains in [place] and noted increased exertional dyspnea. However, now that he is back home, symptoms are  at baseline. He is short of breath walking up hills and stairs.  No dyspnea walking on flat ground.  No chest pain.  No orthopnea/PND.  Weight stable.  He has low back pain and neuropathy in his feet.  He exercises regularly.   REDS clip 25%   ECG (personally reviewed): atrial fibrillation at 76, BiV paced with PVCs.   Labs (5/19): LDL 72, HDL 41, K 4.2, creatinine 0.86 Labs (10/19): K 3.9, creatinine 0.96 Labs (12/19): K 4.4, creatinine 0.99, LDL 63, HDl 41  St Jude device interrogation:  93% BiV pacing, no VT.   PMH: 1. Low back  pain 2. Hyperlipidemia 3. GERD 4. BPH 5. Polymyalgia rheumatica 6. Atrial fibrillation: Failed Tikosyn in the past.  Paroxysmal, on amiodarone and warfarin.  DCCV in 4/13, 7/13, 10/13.  Has held NSR with amiodarone.  However, concern for amiodarone lung toxicity: PFTs (4/15) with FVC 90% predicted, FEV1 90%, ratio 97%, DLCO 50%.   Recurrence of atrial fibrillation in 5/16, TEE-guided DCCV back to NSR in 5/16.  - Recurrence of atrial fibrillation in 8/17. TEE-guided DCCV 11/07/15.  - Recurrence of atrial fibrillation 9/17.  DCCV 11/20/15 but back in atrial fibrillation by 11/26/15.  - Amiodarone stopped due to persistent atrial fibrillation.  - AV nodal ablation in 2/18 to promote BiV pacing.  7. Transaminitis: Mild, attributed to amiodarone.  8. Complete heart block s/p St Jude CRT-P device.  Patient developed PCM pocket infection in 4/14 and had his first system removed with placement of a temporary permanent PCM.  He later had CRT-P device replaced.  This was complicated by large right-sided hemorrhagic pleural effusion requiring chest tube.   9. Nonischemic cardiomyopathy: Echo (9/13) with EF 40-45%, diffuse HK, mild Craig.  Mild CAD only on prior LHC.  Echo (4/14) with EF 45-50%.  Adenosine Cardiolite (5/15) with EF 36% (visually appeared higher per report), small area of scar with peri-infarct ischemia in the apex, apical lateral wall, and mid anterolateral wall. Echo (5/15) with EF 40-45%, basal to mid inferolateral severe hypokinesis, basal to mid anterolateral hypokinesis, mildly dilated RV with mildly decreased RV systolic function, mild to moderate Craig.  TEE (5/16) with EF 35-40%, inferior and inferolateral severe hypokinesis, normal RV size with mildly decreased systolic function, moderate Craig, peak RV-RA gradient 26 mmHg.  - TEE (8/17): EF 25-30%, mildly dilated RV with mildly decreased systolic function, moderate biatrial enlargement, moderate central Craig.  - AV nodal ablation 2/18.  - Echo  (5/19): EF 30-35%, mild AI.  10. Large right pleural effusion requiring chest tube following removal and reimplantation of PCM. Most recent chest CT in 6/15 showed significant decrease in size of loculated pleural effusion since 5/14.   11. Right CEA 5/19.  12. Right central retinal artery occlusion in 5/19.   SH: Married, prior smoker (quit 1985), no ETOH x years, retired, lives at [employer]  [place]ck[place] Redinger: Father with MI at 38, mother with AAA.   Review of systems complete and found to be negative unless listed in HPI.   Current Outpatient Medications  Medication Sig Dispense Refill  . acetaminophen (TYLENOL) 500 MG tablet Take 1,000 mg by mouth every 6 (six) hours as needed for moderate pain or headache.    Marland Kitchen apixaban (ELIQUIS) 5 MG TABS tablet Take 1 tablet (5 mg total) by mouth 2 (two) times daily. 180 tablet 2  . dutasteride (AVODART) 0.5 MG capsule Take 0.5 mg by mouth daily.     . fluticasone (FLONASE) 50 MCG/ACT nasal spray  Place 2 sprays into both nostrils daily as needed for allergies or rhinitis. (Patient taking differently: Place 2 sprays into both nostrils daily. ) 16 g 3  . furosemide (LASIX) 80 MG tablet Take 80mg  every morning and 40mg  every other evening. 135 tablet 3  . hydroxypropyl methylcellulose / hypromellose (ISOPTO TEARS / GONIOVISC) 2.5 % ophthalmic solution Place 1 drop into both eyes 3 (three) times daily as needed for dry eyes.     Marland Kitchen losartan (COZAAR) 25 MG tablet Take 0.5 tablets (12.5 mg total) by mouth daily. 45 tablet 3  . metoprolol succinate (TOPROL-XL) 25 MG 24 hr tablet Take 1 tablet (25 mg total) by mouth daily. Please call for office visit [phone] 45 tablet 2  . potassium chloride SA (KLOR-CON M20) 20 MEQ tablet Take 1 tablet (20 mEq total) by mouth 2 (two) times daily. 180 tablet 2  . rosuvastatin (CRESTOR) 10 MG tablet Take 1 tablet (10 mg total) by mouth at bedtime. 90 tablet 3  . spironolactone (ALDACTONE) 25 MG tablet Take 1 tablet (25 mg total) by mouth  daily. 90 tablet 3  . tamsulosin (FLOMAX) 0.4 MG CAPS capsule Take 0.4 mg by mouth 2 (two) times daily.      No current facility-administered medications for this encounter.     BP 100/62   Pulse (!) 48   Wt 70.7 kg (155 lb 12.8 oz)   SpO2 96%   BMI 25.93 kg/m   Wt Readings from Last 3 Encounters:  05/10/18 70.7 kg (155 lb 12.8 oz)  05/03/18 69.6 kg (153 lb 8 oz)  04/06/18 68.5 kg (151 lb)   General: NAD Neck: JVP 7-8 cm, no thyromegaly or thyroid nodule.  Lungs: Clear to auscultation bilaterally with normal respiratory effort. CV: Nondisplaced PMI.  Heart regular S1/S2, no S3/S4, no murmur.  1+ ankle edema.  No carotid bruit.  Normal pedal pulses.  Abdomen: Soft, nontender, no hepatosplenomegaly, no distention.  Skin: Intact without lesions or rashes.  Neurologic: Alert and oriented x 3.  Psych: Normal affect. Extremities: No clubbing or cyanosis.  HEENT: Normal.   Assessment/Plan: 1. Chronic systolic CHF:  Presumed nonischemic cardiomyopathy (prior cath with only mild CAD).  He had an adenosine Cardiolite in 5/15 with EF 36% and possible small area of apical and lateral scar with peri-infarct ischemia. He has St Jude CRT-P.  Echo 5/19 with EF 30-35%. NYHA class II symptoms, stable.  He is not volume overloaded by exam or REDS clip reading.    - Continue Lasix 80 qam/40 every other pm. BMET today.  - Continue Toprol XL 25 mg daily.  - Increase spironolactone to 25 mg daily. BMET 10 days.  - Continue losartan 12.5 mg daily. - Repeat echo at followup in 3 months.    2. Atrial fibrillation: Now persistent.  Had breakthrough with Tikosyn and now not able to hold NSR on amiodarone. He is now off amiodarone.  He is status post AV nodal ablation with significant improvement in symptoms and BiV pacing percentage (93% today, suspect mildly low due to PVCs).  - Continue apixiban 5 mg BID.  3. Hyperlipidemia: History of nonobstructive CAD and recent CEA.    - Continue Crestor, good lipids  in 12/19.  4. Rt CEA occlusion s/p R CEA 07/26/17: He had an ulcerated, 70% stenotic plaque in the right internal carotid. Suspect this is the source of embolism for CRAO.     - Continue statin.  - Followed at [facility].   Followup in 3  months with echo  [name], MD 05/10/2018

## 2018-05-10 NOTE — Progress Notes (Signed)
REDS VEST READING= 25% CHEST RULER=29

## 2018-05-13 LAB — CUP PACEART REMOTE DEVICE CHECK
Battery Remaining Percentage: 65 %
Battery Voltage: 2.9 V
Date Time Interrogation Session: 20200303143707
Implantable Lead Implant Date: 20140422
Implantable Lead Implant Date: 20140424
Implantable Lead Location: 753858
Implantable Lead Location: 753859
Implantable Lead Model: 5076
Implantable Pulse Generator Implant Date: 20140422
Lead Channel Impedance Value: 430 Ohm
Lead Channel Impedance Value: 980 Ohm
Lead Channel Pacing Threshold Amplitude: 0.75 V
Lead Channel Pacing Threshold Amplitude: 1 V
Lead Channel Pacing Threshold Pulse Width: 0.5 ms
Lead Channel Pacing Threshold Pulse Width: 0.5 ms
Lead Channel Setting Pacing Amplitude: 2.5 V
Lead Channel Setting Pacing Pulse Width: 0.5 ms
Lead Channel Setting Pacing Pulse Width: 0.5 ms
Lead Channel Setting Sensing Sensitivity: 4 mV
MDC IDC LEAD IMPLANT DT: 20140424
MDC IDC LEAD LOCATION: 753860
MDC IDC MSMT BATTERY REMAINING LONGEVITY: 63 mo
MDC IDC MSMT LEADCHNL RV SENSING INTR AMPL: 11.1 mV
MDC IDC SET LEADCHNL RV PACING AMPLITUDE: 2.5 V
Pulse Gen Model: 3210
Pulse Gen Serial Number: 2926516

## 2018-05-18 ENCOUNTER — Encounter: Payer: Self-pay | Admitting: Cardiology

## 2018-05-18 NOTE — Progress Notes (Signed)
Remote pacemaker transmission.   

## 2018-05-20 ENCOUNTER — Other Ambulatory Visit: Payer: Self-pay

## 2018-05-20 ENCOUNTER — Ambulatory Visit (HOSPITAL_COMMUNITY)
Admission: RE | Admit: 2018-05-20 | Discharge: 2018-05-20 | Disposition: A | Discharge status: Home / Self Care | Payer: Medicare Other | Source: Ambulatory Visit | Attending: Cardiology | Admitting: Cardiology

## 2018-05-20 DIAGNOSIS — I5022 Chronic systolic (congestive) heart failure: Secondary | ICD-10-CM | POA: Insufficient documentation

## 2018-05-20 LAB — BASIC METABOLIC PANEL
Anion gap: 8 (ref 5–15)
BUN: 19 mg/dL (ref 8–23)
CALCIUM: 9.4 mg/dL (ref 8.9–10.3)
CO2: 27 mmol/L (ref 22–32)
CREATININE: 1.03 mg/dL (ref 0.61–1.24)
Chloride: 102 mmol/L (ref 98–111)
GLUCOSE: 99 mg/dL (ref 70–99)
Potassium: 4.4 mmol/L (ref 3.5–5.1)
Sodium: 137 mmol/L (ref 135–145)

## 2018-05-23 ENCOUNTER — Other Ambulatory Visit (HOSPITAL_COMMUNITY): Payer: Self-pay | Admitting: *Deleted

## 2018-05-23 ENCOUNTER — Other Ambulatory Visit: Payer: Self-pay | Admitting: Family Medicine

## 2018-05-23 MED ORDER — FLUTICASONE PROPIONATE 50 MCG/ACT NA SUSP: 2.0000 | Freq: Every day | NASAL | 3 refills | Status: DC | PRN

## 2018-05-23 MED ORDER — METOPROLOL SUCCINATE ER 25 MG PO TB24: 25.0000 mg | ORAL_TABLET | Freq: Every day | ORAL | 2 refills | Status: DC

## 2018-05-23 NOTE — Telephone Encounter (Signed)
Copied from [place] [id number]. Topic: Quick Communication - Rx Refill/Question >> May 23, 2018 10:46 AM Alanda Slim E wrote: Medication: fluticasone (FLONASE) 50 MCG/ACT nasal spray  Has the patient contacted their pharmacy? Yes - changed to mail order pharmacy   Preferred Pharmacy (with phone number or street name):   Agent: Please be advised that RX refills may take up to 3 business days. We ask that you follow-up with your pharmacy.

## 2018-05-30 ENCOUNTER — Ambulatory Visit: Payer: Medicare Other | Admitting: Neurology

## 2018-06-01 ENCOUNTER — Ambulatory Visit: Payer: Medicare Other | Admitting: Internal Medicine

## 2018-06-03 ENCOUNTER — Ambulatory Visit (INDEPENDENT_AMBULATORY_CARE_PROVIDER_SITE_OTHER): Payer: Medicare Other | Admitting: Family Medicine

## 2018-06-03 ENCOUNTER — Encounter: Payer: Self-pay | Admitting: Family Medicine

## 2018-06-03 VITALS — BP 92/65 | HR 69 | Temp 97.7°F | Ht 65.0 in | Wt 150.3 lb

## 2018-06-03 DIAGNOSIS — Z0000 Encounter for general adult medical examination without abnormal findings: Secondary | ICD-10-CM

## 2018-06-03 NOTE — Patient Instructions (Addendum)
[name] , Thank you for taking time to come for your Medicare Wellness Visit. I appreciate your ongoing commitment to your health goals. Please review the following plan we discussed and let me know if I can assist you in the future.   These are the goals we discussed: 1. Continue great job with regular exercise!  2. Consider doing mentally stimulating activities like puzzles   This is a list of the screening recommended for you and due dates:  Health Maintenance  Topic Date Due  . Tetanus Vaccine  02/15/2024  . Flu Shot  Completed  . Pneumonia vaccines  Completed

## 2018-06-03 NOTE — Progress Notes (Signed)
Phone [phone]   Subjective:  Virtual visit via Video note. Patient presents today for their annual wellness visit.    Our team/I connected with [name] on 06/03/18 at  3:40 PM EDT by a video enabled telemedicine application (webex) and verified that I am speaking with the correct person using two identifiers.  Location patient: Home-[facility] Location provider: [name], office Persons participating in the virtual visit: patient  Our team/I discussed the limitations of evaluation and management by telemedicine and the availability of in person appointments. In light of current covid-19 pandemic, patient also understands that we are trying to protect them by minimizing in office contact if at all possible.  The patient expressed consent for telemedicine visit and agreed to proceed.   ROS- concerned about short term memory loss, some weight fluctuation , fecal incontinence after hemorrhoid banding , post nasal drip, numbness in hands, neuropathy in feet  Preventive Screening-Counseling & Management  Smoking Status: former Smoker but quit 1985 Second Hand Smoking status: No smokers in home  Advanced directives- wife [name] is [name]. Would want short term resuscitation efforts  Risk Factors Regular exercise: yes- biking/strength training Diet: trying to eat a healthy diet- states some variability in weigh Wt Readings from Last 3 Encounters:  06/03/18 150 lb 4.8 oz (68.2 kg)  05/10/18 155 lb 12.8 oz (70.7 kg)  05/03/18 153 lb 8 oz (69.6 kg)   Fall Risk: None  Fall Risk  06/03/2018 09/20/2017 08/25/2016 07/15/2015 06/26/2014  Falls in the past year? 0 No No No No  Number falls in past yr: 0 - - - -  Injury with Fall? 0 - - - -   Opioid use history:  no long term opioids use  Cardiac risk factors:  advanced age (older than 36 for men, 16 for women)  Hyperlipidemia - on statin No diabetes.  Family History: dad died at 53 with heart attack He has known CAD   Depression  Screen None. PHQ2 0  Depression screen [name] 06/03/2018 09/20/2017 08/25/2016 07/15/2015  Decreased Interest 0 0 0 0  Down, Depressed, Hopeless 0 0 0 0  PHQ - 2 Score 0 0 0 0  Some recent data might be hidden    Activities of Daily Living Independent ADLs and IADLs   Hearing Difficulties: -patient endorses- wearing hearing aids- last updated before Christmas  Cognitive Testing             reports mild memory issues  MMSE scores 30/30 though MMSE - Mini Mental State Exam 06/03/2018  Orientation to time 5  Orientation to Place 5  Registration 3  Attention/ Calculation 5  Recall 3  Language- name 2 objects 2  Language- repeat 1  Language- follow 3 step command 3  Language- read & follow direction 1  Write a sentence 1  Copy design 1  Total score 30   List the Names of Other Physician/Practitioners you currently use: -[name] -[name] - [name] eye - [name] dentist  Immunization History  Administered Date(s) Administered  . Influenza Split 12/25/2010, 12/08/2011, 12/07/2012  . Influenza Whole 01/07/2007, 11/29/2007, 12/10/2008, 12/16/2009  . Influenza, High Dose Seasonal PF 12/05/2015, 12/17/2016, 12/15/2017  . Influenza,inj,Quad PF,6+ Mos 12/04/2014  . Influenza,inj,quad, With Preservative 12/07/2016  . Influenza-Unspecified 12/23/2013, 12/09/2016, 12/15/2017  . Pneumococcal Conjugate-13 02/14/2014  . Pneumococcal Polysaccharide-23 11/10/2003, 12/25/2010  . Td 11/10/2003  . Tetanus 02/14/2014  . Zoster 03/09/2008  . Zoster Recombinat (Shingrix) 12/23/2017, 04/08/2018   Required Immunizations needed  today - none he is up to date  Screening tests- up to date Past aged based screening for colon cancer, prostate, lung cancer  ROS- No pertinent positives discovered in course of AWV  The following were reviewed and entered/updated in epic: Past Medical History:  Diagnosis Date  . Arthritis    "back" (03/20/2014)  . Atrial fibrillation ([place])   . BPH  (benign prostatic hypertrophy)   . Breast mass    "on both sides" (06/28/2012)  . Cardiomyopathy primary-nonischemic EF 45%  . Carotid artery occlusion   . CHF (congestive heart failure) ([place])   . Coronary artery disease   . Diverticulosis of colon without hemorrhage 01/03/2014  . Dysphagia   . Elevated liver enzymes   . Esophageal stricture   . GERD (gastroesophageal reflux disease)   . Hearing loss, mixed, bilateral   . Hiatal hernia   . Hx of cardiovascular stress test    Adenosine Myoview (07/2013):  Apical cap, apical lateral and mid anterolateral scar, small amount of peri-infarct ischemia, EF 36%; Medium Risk  . Hyperlipidemia   . Increased prostate specific antigen (PSA) velocity   . Internal hemorrhoids 01/03/2014  . Lumbar back pain   . Pacemaker    Craig jude  . Pacemaker infection ([place])    06/28/12  . Polymyalgia rheumatica ([place])   . Sick sinus syndrome [name])    Patient Active Problem List   Diagnosis Date Noted  . History of central retinal artery occlusion 08/04/2017    Priority: High  . Stenosis of right internal carotid artery s/p endarterectomy 07/2017     Priority: High  . Chronic systolic CHF (congestive heart failure) ([place]) 03/21/2013    Priority: High  . Nonischemic cardiomyopathy ([place]) 09/20/2010    Priority: High  . PPM-Craig.Jude 11/27/2008    Priority: High  . CAD (coronary artery disease) 07/20/2008    Priority: High  . SICK SINUS SYNDROME 07/20/2008    Priority: High  . History of polymyalgia rheumatica 07/03/2008    Priority: High  . Atrial fibrillation ([place]) 03/16/2007    Priority: High  . AK (actinic keratosis) 01/28/2016    Priority: Medium  . Lipoma 07/08/2015    Priority: Medium  . Neuropathy 05/16/2015    Priority: Medium  . Chronic cough 08/18/2013    Priority: Medium  . Pleural effusion, right 08/16/2013    Priority: Medium  . Pleural effusion on right 07/06/2012    Priority: Medium  . Hyperlipidemia 03/16/2007    Priority: Medium   . BPH (benign prostatic hyperplasia) 03/16/2007    Priority: Medium  . Thrombus of left atrial appendage without antecedent myocardial infarction 07/24/2017    Priority: Low  . Traumatic hematoma 04/01/2017    Priority: Low  . Left knee pain 10/23/2016    Priority: Low  . Prolapsed internal hemorrhoids, grade 2 - causing fecal seepage 06/10/2016    Priority: Low  . Chronic anticoagulation 02/10/2016    Priority: Low  . Left breast lump 07/15/2015    Priority: Low  . Trigger thumb of right hand 07/04/2014    Priority: Low  . Encounter for therapeutic drug monitoring 07/02/2014    Priority: Low  . Allergic rhinitis 02/14/2014    Priority: Low  . Fecal incontinence 10/18/2013    Priority: Low  . Recurrent right inguinal hernia 11/22/2012    Priority: Low  . Intention tremor 11/01/2012    Priority: Low  . MIXED HEARING LOSS BILATERAL 01/10/2008    Priority: Low  .  GERD 03/16/2007    Priority: Low  . Incomplete passage of stool 04/06/2018   Past Surgical History:  Procedure Laterality Date  . AV NODE ABLATION N/A 04/20/2016   Procedure: AV Node Ablation;  Surgeon: Evans Lance, MD;  Location: [place] [facility];  Service: Cardiovascular;  Laterality: N/A;  . CARDIAC CATHETERIZATION  1980's   [place]  . CARDIOVERSION  06/17/2011   Procedure: CARDIOVERSION;  Surgeon: Lelon Perla, MD;  Location: [place];  Service: Cardiovascular;  Laterality: N/A;  . CARDIOVERSION  09/09/2011   Procedure: CARDIOVERSION;  Surgeon: Carlena Bjornstad, MD;  Location: [place];  Service: Cardiovascular;  Laterality: N/A;  . CARDIOVERSION  01/01/2012   Procedure: CARDIOVERSION;  Surgeon: Hillary Bow, MD;  Location: [name] ENDOSCOPY;  Service: Cardiovascular;  Laterality: N/A;  . CARDIOVERSION N/A 08/03/2014   Procedure: CARDIOVERSION;  Surgeon: Larey Dresser, MD;  Location: [facility] ENDOSCOPY;  Service: Cardiovascular;  Laterality: N/A;  . CARDIOVERSION N/A 11/07/2015   Procedure: CARDIOVERSION;  Surgeon:  Larey Dresser, MD;  Location[name] END[facility]COPY;  Service: Cardiovascular;  Laterality: N/A;  . CARDIOVERSION N/A 11/20/2015   Procedure: CARDIOVERSION;  Surgeon: Larey Dresser, MD;  Location: [place];  Service: Cardiovascular;  Laterality: N/A;  . CAROTID ENDARTERECTOMY    . CATARACT EXTRACTION, BILATERAL Bilateral 1980's  . COLONOSCOPY N/A 01/03/2014   Procedure: COLONOSCOPY;  Surgeon: Gatha Mayer, MD;  Location: [facility] ENDOSCOPY;  Service: Endoscopy;  Laterality: N/A;  . ENDARTERECTOMY Right 07/26/2017   Procedure: RIGHT CAROTID  ARTERY ENDARTERECTOMY;  Surgeon: Rosetta Posner, MD;  Location: [facility] OR;  Service: Vascular;  Laterality: Right;  . ESOPHAGOGASTRODUODENOSCOPY    . GENERATOR REMOVAL Left 06/15/2012   Procedure: GENERATOR REMOVAL;  Surgeon: Evans Lance, MD;  Location: [place];  Service: Cardiovascular;  Laterality: Left;  . HEMORRHOID BANDING    . INGUINAL HERNIA REPAIR Right 2012; 03/20/2014  . INGUINAL HERNIA REPAIR Right 03/20/2014   Procedure: OPEN REPAIR OF RIGHT INGUINAL WITH MESH;  Surgeon: Donnie Mesa, MD;  Location: [place];  Service: General;  Laterality: Right;  . INSERT / REPLACE / REMOVE PACEMAKER    . INSERTION OF MESH Right 03/20/2014   Procedure: INSERTION OF MESH;  Surgeon: Donnie Mesa, MD;  Location: [place];  Service: General;  Laterality: Right;  . KNEE ARTHROSCOPY WITH LATERAL MENISECTOMY Left 12/16/2016   Procedure: KNEE ARTHROSCOPY WITH LATERAL MENISECTOMY and Chondroplasty;  Surgeon: Earlie Server, MD;  Location: [place];  Service: Orthopedics;  Laterality: Left;  . LEAD REVISION N/A 06/29/2012   Procedure: LEAD REVISION;  Surgeon: Deboraha Sprang, MD;  Location: [name] C[facility] LAB;  Service: Cardiovascular;  Laterality: N/A;  . PACEMAKER GENERATOR CHANGE N/A 06/15/2012   Procedure: PACEMAKER GENERATOR CHANGE;  Surgeon: Evans Lance, MD;  Location: [place];  Service: Cardiovascular;  Laterality: N/A;  . PACEMAKER REVISION  06/30/2012   Procedure: PACEMAKER LEAD REVISION;   Surgeon: Evans Lance, MD;  Location: [name] CATH LAB;  Service: Cardiovascular;;  . PARACENTESIS  ~ 03/6107   complication from pacer change in 2014  . PATCH ANGIOPLASTY Right 07/26/2017   Procedure: WITH PATCH ANGIOPLASTY;  Surgeon: Rosetta Posner, MD;  Location[place];  Service: Vascular;  Laterality: Right;  . PERMANENT PACEMAKER INSERTION N/A 06/28/2012   Procedure: PERMANENT PACEMAKER INSERTION;  Surgeon: Evans Lance, MD;  Location: [name] CATH LAB;  Service: Cardiovascular;  Laterality: N/A;  . TEE WITHOUT CARDIOVERSION  01/01/2012   Procedure: TRANSESOPHAGEAL ECHOCARDIOGRAM (TEE);  Surgeon: Fay Records, MD;  Location: [facility] ENDOSCOPY;  Service: Cardiovascular;  Laterality: N/A;  . TEE WITHOUT CARDIOVERSION N/A 08/03/2014   Procedure: TRANSESOPHAGEAL ECHOCARDIOGRAM (TEE);  Surgeon: Larey Dresser, MD;  Location: [place];  Service: Cardiovascular;  Laterality: N/A;  . TEE WITHOUT CARDIOVERSION N/A 11/07/2015   Procedure: TRANSESOPHAGEAL ECHOCARDIOGRAM (TEE);  Surgeon: Larey Dresser, MD;  Location: [place];  Service: Cardiovascular;  Laterality: N/A;  . TONSILLECTOMY AND ADENOIDECTOMY  1938  . UMBILICAL HERNIA REPAIR  [phone] X 3    Family History  Problem Relation Age of Onset  . Heart attack Father 16       deceased  . AAA (abdominal aortic aneurysm) Mother        deceased AAA  . Hypertension Mother   . Other Brother        deceased at birth  . Healthy Son   . Healthy Daughter   . Diabetes Cousin     Medications- reviewed and updated Current Outpatient Medications  Medication Sig Dispense Refill  . acetaminophen (TYLENOL) 500 MG tablet Take 1,000 mg by mouth every 6 (six) hours as needed for moderate pain or headache.    Marland Kitchen apixaban (ELIQUIS) 5 MG TABS tablet Take 1 tablet (5 mg total) by mouth 2 (two) times daily. 180 tablet 2  . dutasteride (AVODART) 0.5 MG capsule Take 0.5 mg by mouth daily.     . fluticasone (FLONASE) 50 MCG/ACT nasal spray Place 2 sprays into both  nostrils daily as needed for allergies or rhinitis. 16 g 3  . furosemide (LASIX) 80 MG tablet Take 80mg  every morning and 40mg  every other evening. 135 tablet 3  . losartan (COZAAR) 25 MG tablet Take 0.5 tablets (12.5 mg total) by mouth daily. 45 tablet 3  . metoprolol succinate (TOPROL-XL) 25 MG 24 hr tablet Take 1 tablet (25 mg total) by mouth daily. 90 tablet 2  . potassium chloride SA (KLOR-CON M20) 20 MEQ tablet Take 1 tablet (20 mEq total) by mouth 2 (two) times daily. 180 tablet 2  . rosuvastatin (CRESTOR) 10 MG tablet Take 1 tablet (10 mg total) by mouth at bedtime. 90 tablet 3  . spironolactone (ALDACTONE) 25 MG tablet Take 1 tablet (25 mg total) by mouth daily. 90 tablet 3  . tamsulosin (FLOMAX) 0.4 MG CAPS capsule Take 0.4 mg by mouth 2 (two) times daily.     . hydroxypropyl methylcellulose / hypromellose (ISOPTO TEARS / GONIOVISC) 2.5 % ophthalmic solution Place 1 drop into both eyes 3 (three) times daily as needed for dry eyes.      No current facility-administered medications for this visit.     Allergies-reviewed and updated Allergies  Allergen Reactions  . Antihistamines, Diphenhydramine-Type Other (See Comments)    Causes difficulty in ability to urinate.    Social History   Socioeconomic History  . Marital status: Married    Spouse name: Not on file  . Number of children: 3  . Years of education: Not on file  . Highest education level: Not on file  Occupational History  . Occupation: retired    Fish farm manager: RETIRED    Comment: Engineering geologist  Social Needs  . Financial resource strain: Not on file  . Food insecurity:    Worry: Not on file    Inability: Not on file  . Transportation needs:    Medical: Not on file    Non-medical: Not on file  Tobacco Use  . Smoking status: Former Smoker  Packs/day: 1.00    Years: 40.00    Pack years: 40.00    Types: Cigarettes    Last attempt to quit: 03/10/1983    Years since quitting: 35.2  . Smokeless tobacco:  Never Used  Substance and Sexual Activity  . Alcohol use: No    Alcohol/week: 0.0 standard drinks    Comment: quit drinking 2000-? alcohol related cardiomyopathy  . Drug use: No  . Sexual activity: Not Currently  Lifestyle  . Physical activity:    Days per week: Not on file    Minutes per session: Not on file  . Stress: Not on file  Relationships  . Social connections:    Talks on phone: Not on file    Gets together: Not on file    Attends religious service: Not on file    Active member of club or organization: Not on file    Attends meetings of clubs or organizations: Not on file    Relationship status: Not on file  Other Topics Concern  . Not on file  Social History Narrative   Married. 3 children (6 together). 5 grandkids with with 5 grandkids (2nd marriage). No greatgrandkids.    One story home.   Retired from Education officer, community: college   Hobbies: play golf, bridge, garden, write family stories      Objective:  BP 92/65   Pulse 69   Temp 97.7 F (36.5 C) (Oral)   Ht 5\' 5"  (1.651 m)   Wt 150 lb 4.8 oz (68.2 kg)   SpO2 95%   BMI 25.01 kg/m  Gen: NAD, resting comfortably HEENT: Mucous membranes are moist. Oropharynx normal Neck: no thyromegaly CV: RRR no murmurs rubs or gallops Lungs: CTAB no crackles, wheeze, rhonchi Abdomen: soft/nontender/nondistended/normal bowel sounds. No rebound or guarding.  Ext: no edema Skin: warm, dry Neuro: grossly normal, moves all extremities, PERRLA   Assessment/Plan:  AWV completed- discussed recommended screenings and documented any personalized health advice and referrals for preventive counseling. See AVS as well which was given to patient.   Status of chronic or acute concerns   has post nasal drip- particularly if he lays downt his tends to bother him. Taking flonase intermittently. - he is going to try this regularly  Hand asleep when he wakes up at night. - discussed may be carpal tunnel-  when moves it around can go away  Plans to go back to Dr. Carlean Purl when things are better with covid 19.   Needs to get back into DR. Patel for neuropathy when things slow down.   Doing 4 days of exercise per week  Future Appointments  Date Time Provider Agency [place]  07/13/2018 11:45 AM Evans Lance, MD [facility]  07/26/2018  2:00 PM [facility] HS VASC 2 - [name]  07/26/2018  2:45 PM Early, Arvilla Meres, MD [facility]  08/09/2018  8:10 AM [facility] DEVICE REMOTES [facility]  08/12/2018 11:00 AM MC ECHO 1-[facility] [place]  08/12/2018 12:00 PM Larey Dresser, MD [facility] None  08/31/2018  7:50 AM Narda Amber K, DO [facility] None   Did not schedule planned follow up- opt to monitor for now in light of current coranavirus  Lab/Order associations: Preventative health care  Return precautions advised.  Garret Reddish, MD

## 2018-06-03 NOTE — Addendum Note (Signed)
Addended by: [name] on: 06/03/2018 05:06 PM   Modules accepted: Level of Service

## 2018-06-10 ENCOUNTER — Other Ambulatory Visit: Payer: Self-pay

## 2018-06-10 MED ORDER — POTASSIUM CHLORIDE CRYS ER 20 MEQ PO TBCR: 20.0000 meq | EXTENDED_RELEASE_TABLET | Freq: Two times a day (BID) | ORAL | 3 refills | Status: DC

## 2018-06-14 ENCOUNTER — Ambulatory Visit: Payer: Medicare Other | Admitting: Vascular Surgery

## 2018-06-14 ENCOUNTER — Encounter (HOSPITAL_COMMUNITY): Payer: Medicare Other

## 2018-07-05 ENCOUNTER — Other Ambulatory Visit: Payer: Self-pay

## 2018-07-05 DIAGNOSIS — I6529 Occlusion and stenosis of unspecified carotid artery: Secondary | ICD-10-CM

## 2018-07-12 ENCOUNTER — Telehealth (HOSPITAL_COMMUNITY): Payer: Self-pay | Admitting: *Deleted

## 2018-07-12 MED ORDER — FUROSEMIDE 80 MG PO TABS: ORAL_TABLET | ORAL | 3 refills | Status: DC

## 2018-07-12 NOTE — Telephone Encounter (Signed)
Pt is still having some shortness of breath, weight is stable, slight edema in ankles. Patient wants to know if he can take his evening diuretic every evening and not every other evening as prescribed.  Pts last creatinine on 3/13 was 1.05 and potassium 4.4.   Message routed to [place] for advice

## 2018-07-12 NOTE — Telephone Encounter (Signed)
OK to take evening diuretic dose every evening.  He has a device, can we get a remote reading sent in to check on thoracic impedance?

## 2018-07-12 NOTE — Telephone Encounter (Signed)
Pt aware and agreeable with plan. Pt will send remote transmission.

## 2018-07-13 ENCOUNTER — Ambulatory Visit: Payer: Medicare Other | Admitting: Internal Medicine

## 2018-07-25 ENCOUNTER — Telehealth (HOSPITAL_COMMUNITY): Payer: Self-pay

## 2018-07-25 NOTE — Telephone Encounter (Signed)
  Left voicemail for patient re: tomorrow's appointment on home phone and cell phone.

## 2018-07-26 ENCOUNTER — Ambulatory Visit: Payer: Medicare Other | Admitting: Vascular Surgery

## 2018-07-26 ENCOUNTER — Other Ambulatory Visit: Payer: Self-pay

## 2018-07-26 ENCOUNTER — Ambulatory Visit (HOSPITAL_COMMUNITY)
Admission: RE | Admit: 2018-07-26 | Discharge: 2018-07-26 | Disposition: A | Discharge status: Home / Self Care | Payer: Medicare Other | Source: Ambulatory Visit | Attending: Vascular Surgery | Admitting: Vascular Surgery

## 2018-07-26 ENCOUNTER — Encounter: Payer: Self-pay | Admitting: Vascular Surgery

## 2018-07-26 ENCOUNTER — Encounter (HOSPITAL_COMMUNITY): Payer: Medicare Other

## 2018-07-26 ENCOUNTER — Ambulatory Visit (INDEPENDENT_AMBULATORY_CARE_PROVIDER_SITE_OTHER): Payer: Medicare Other | Admitting: Vascular Surgery

## 2018-07-26 VITALS — BP 97/59 | HR 72 | Temp 97.6°F | Resp 20 | Ht 65.0 in | Wt 151.0 lb

## 2018-07-26 DIAGNOSIS — I6529 Occlusion and stenosis of unspecified carotid artery: Secondary | ICD-10-CM | POA: Diagnosis not present

## 2018-07-26 NOTE — Progress Notes (Signed)
[facility] [place]  Patient name: [name] MRN: [id number] DOB: [birth year] Sex: male  REASON FOR VISIT: Low up right carotid endarterectomy  HPI: FREDERICK MARRO is a 85 y.o. male here today for follow-up.  He had a retinal artery embolus on the right and was found to have critical stenosis in his right internal carotid artery.  He underwent uneventful endarterectomy on 07/26/2017.  He denies any new visual changes.  He does have a field loss on the lower visual field on the right.  He does not have any other focal neurologic deficits.  He does report some persistent mild peri-incisional numbness but reports this is since returned to normal baseline  Past Medical History:  Diagnosis Date  . Arthritis    "back" (03/20/2014)  . Atrial fibrillation ([place])   . BPH (benign prostatic hypertrophy)   . Breast mass    "on both sides" (06/28/2012)  . Cardiomyopathy primary-nonischemic EF 45%  . Carotid artery occlusion   . CHF (congestive heart failure) ([place])   . Coronary artery disease   . Diverticulosis of colon without hemorrhage 01/03/2014  . Dysphagia   . Elevated liver enzymes   . Esophageal stricture   . GERD (gastroesophageal reflux disease)   . Hearing loss, mixed, bilateral   . Hiatal hernia   . Hx of cardiovascular stress test    Adenosine Myoview (07/2013):  Apical cap, apical lateral and mid anterolateral scar, small amount of peri-infarct ischemia, EF 36%; Medium Risk  . Hyperlipidemia   . Increased prostate specific antigen (PSA) velocity   . Internal hemorrhoids 01/03/2014  . Lumbar back pain   . Pacemaker    st jude  . Pacemaker infection ([place])    06/28/12  . Polymyalgia rheumatica ([place])   . Sick sinus syndrome ([facility])     Family History  Problem Relation Age of Onset  . Heart attack Father 23       deceased  . AAA (abdominal aortic aneurysm) Mother        deceased AAA  . Hypertension Mother   .  Other Brother        deceased at birth  . Healthy Son   . Healthy Daughter   . Diabetes Cousin     SOCIAL HISTORY: Social History   Tobacco Use  . Smoking status: Former Smoker    Packs/day: 1.00    Years: 40.00    Pack years: 40.00    Types: Cigarettes    Last attempt to quit: 03/10/1983    Years since quitting: 35.4  . Smokeless tobacco: Never Used  Substance Use Topics  . Alcohol use: No    Alcohol/week: 0.0 standard drinks    Comment: quit drinking 2000-? alcohol related cardiomyopathy    Allergies  Allergen Reactions  . Antihistamines, Diphenhydramine-Type Other (See Comments)    Causes difficulty in ability to urinate.    Current Outpatient Medications  Medication Sig Dispense Refill  . acetaminophen (TYLENOL) 500 MG tablet Take 1,000 mg by mouth every 6 (six) hours as needed for moderate pain or headache.    Marland Kitchen apixaban (ELIQUIS) 5 MG TABS tablet Take 1 tablet (5 mg total) by mouth 2 (two) times daily. 180 tablet 2  . dutasteride (AVODART) 0.5 MG capsule Take 0.5 mg by mouth daily.     . fluticasone (FLONASE) 50 MCG/ACT nasal spray Place 2 sprays into both nostrils daily as needed for allergies or rhinitis. 16 g 3  . furosemide (  LASIX) 80 MG tablet Take 80mg  every morning and 40mg  every evening. 135 tablet 3  . hydroxypropyl methylcellulose / hypromellose (ISOPTO TEARS / GONIOVISC) 2.5 % ophthalmic solution Place 1 drop into both eyes 3 (three) times daily as needed for dry eyes.     Marland Kitchen losartan (COZAAR) 25 MG tablet Take 0.5 tablets (12.5 mg total) by mouth daily. 45 tablet 3  . metoprolol succinate (TOPROL-XL) 25 MG 24 hr tablet Take 1 tablet (25 mg total) by mouth daily. 90 tablet 2  . potassium chloride SA (KLOR-CON M20) 20 MEQ tablet Take 1 tablet (20 mEq total) by mouth 2 (two) times daily. 180 tablet 3  . rosuvastatin (CRESTOR) 10 MG tablet Take 1 tablet (10 mg total) by mouth at bedtime. 90 tablet 3  . spironolactone (ALDACTONE) 25 MG tablet Take 1 tablet (25 mg  total) by mouth daily. 90 tablet 3  . tamsulosin (FLOMAX) 0.4 MG CAPS capsule Take 0.4 mg by mouth 2 (two) times daily.      No current facility-administered medications for this visit.     REVIEW OF SYSTEMS:  [X]  denotes positive finding, [ ]  denotes negative finding Cardiac  Comments:  Chest pain or chest pressure:    Shortness of breath upon exertion:    Short of breath when lying flat:    Irregular heart rhythm:        Vascular    Pain in calf, thigh, or hip brought on by ambulation:    Pain in feet at night that wakes you up from your sleep:     Blood clot in your veins:    Leg swelling:           PHYSICAL EXAM: Vitals:   07/26/18 1008 07/26/18 1011  BP: 95/64 (!) 97/59  Pulse: 72   Resp: 20   Temp: 97.6 F (36.4 C)   SpO2: 98%   Weight: 151 lb (68.5 kg)   Height: 5\' 5"  (1.651 m)     GENERAL: The patient is a well-nourished male, in no acute distress. The vital signs are documented above. CARDIOVASCULAR: Carotid incision is well-healed.  He has no bruits bilaterally.  PULMONARY: There is good air exchange  MUSCULOSKELETAL: There are no major deformities or cyanosis. NEUROLOGIC: No focal weakness or paresthesias are detected. SKIN: There are no ulcers or rashes noted. PSYCHIATRIC: The patient has a normal affect.  DATA:  Carotid duplex today reveals widely patent endarterectomy with no evidence of recurrent stenosis.  Left carotid is 40 to 59% stenosis.  MEDICAL ISSUES: Patient continues to do well following right carotid endarterectomy 1 year ago.  We will see him again in 1 year and then yearly for routine surveillance of his endarterectomy and also has moderate left carotid stenosis.  He will notify should he develop any neurologic deficits    Rosetta Posner, MD [facility] [place] Tel [phone] Pager [phone]

## 2018-08-09 ENCOUNTER — Ambulatory Visit (INDEPENDENT_AMBULATORY_CARE_PROVIDER_SITE_OTHER): Payer: Medicare Other | Admitting: *Deleted

## 2018-08-09 DIAGNOSIS — I495 Sick sinus syndrome: Secondary | ICD-10-CM | POA: Diagnosis not present

## 2018-08-10 ENCOUNTER — Telehealth: Payer: Self-pay

## 2018-08-10 NOTE — Telephone Encounter (Signed)
Left message for patient to remind of missed remote transmission.  

## 2018-08-12 ENCOUNTER — Telehealth (HOSPITAL_COMMUNITY): Payer: Self-pay | Admitting: *Deleted

## 2018-08-12 ENCOUNTER — Encounter (HOSPITAL_COMMUNITY): Payer: Self-pay | Admitting: Cardiology

## 2018-08-12 ENCOUNTER — Other Ambulatory Visit: Payer: Self-pay

## 2018-08-12 ENCOUNTER — Ambulatory Visit (HOSPITAL_BASED_OUTPATIENT_CLINIC_OR_DEPARTMENT_OTHER)
Admission: RE | Admit: 2018-08-12 | Discharge: 2018-08-12 | Disposition: A | Discharge status: Home / Self Care | Payer: Medicare Other | Source: Ambulatory Visit | Attending: Cardiology | Admitting: Cardiology

## 2018-08-12 ENCOUNTER — Ambulatory Visit (HOSPITAL_COMMUNITY)
Admission: RE | Admit: 2018-08-12 | Discharge: 2018-08-12 | Disposition: A | Discharge status: Home / Self Care | Payer: Medicare Other | Source: Ambulatory Visit | Attending: Cardiology | Admitting: Cardiology

## 2018-08-12 VITALS — BP 116/61 | HR 69 | Wt 136.6 lb

## 2018-08-12 DIAGNOSIS — E785 Hyperlipidemia, unspecified: Secondary | ICD-10-CM | POA: Diagnosis not present

## 2018-08-12 DIAGNOSIS — I6521 Occlusion and stenosis of right carotid artery: Secondary | ICD-10-CM | POA: Diagnosis not present

## 2018-08-12 DIAGNOSIS — K219 Gastro-esophageal reflux disease without esophagitis: Secondary | ICD-10-CM | POA: Insufficient documentation

## 2018-08-12 DIAGNOSIS — I5022 Chronic systolic (congestive) heart failure: Secondary | ICD-10-CM | POA: Diagnosis not present

## 2018-08-12 DIAGNOSIS — J90 Pleural effusion, not elsewhere classified: Secondary | ICD-10-CM | POA: Insufficient documentation

## 2018-08-12 DIAGNOSIS — I080 Rheumatic disorders of both mitral and aortic valves: Secondary | ICD-10-CM | POA: Diagnosis not present

## 2018-08-12 DIAGNOSIS — Z7901 Long term (current) use of anticoagulants: Secondary | ICD-10-CM | POA: Insufficient documentation

## 2018-08-12 DIAGNOSIS — Z8249 Family history of ischemic heart disease and other diseases of the circulatory system: Secondary | ICD-10-CM | POA: Insufficient documentation

## 2018-08-12 DIAGNOSIS — I5043 Acute on chronic combined systolic (congestive) and diastolic (congestive) heart failure: Secondary | ICD-10-CM

## 2018-08-12 DIAGNOSIS — Z79899 Other long term (current) drug therapy: Secondary | ICD-10-CM | POA: Diagnosis not present

## 2018-08-12 DIAGNOSIS — I4891 Unspecified atrial fibrillation: Secondary | ICD-10-CM | POA: Diagnosis not present

## 2018-08-12 DIAGNOSIS — I2510 Atherosclerotic heart disease of native coronary artery without angina pectoris: Secondary | ICD-10-CM | POA: Diagnosis not present

## 2018-08-12 DIAGNOSIS — I5042 Chronic combined systolic (congestive) and diastolic (congestive) heart failure: Secondary | ICD-10-CM | POA: Diagnosis not present

## 2018-08-12 DIAGNOSIS — Z87891 Personal history of nicotine dependence: Secondary | ICD-10-CM | POA: Diagnosis not present

## 2018-08-12 DIAGNOSIS — M545 Low back pain: Secondary | ICD-10-CM | POA: Diagnosis not present

## 2018-08-12 DIAGNOSIS — D649 Anemia, unspecified: Secondary | ICD-10-CM | POA: Diagnosis not present

## 2018-08-12 DIAGNOSIS — M353 Polymyalgia rheumatica: Secondary | ICD-10-CM | POA: Insufficient documentation

## 2018-08-12 DIAGNOSIS — I4819 Other persistent atrial fibrillation: Secondary | ICD-10-CM | POA: Diagnosis not present

## 2018-08-12 LAB — BASIC METABOLIC PANEL
Anion gap: 15 (ref 5–15)
BUN: 28 mg/dL — ABNORMAL HIGH (ref 8–23)
CO2: 16 mmol/L — ABNORMAL LOW (ref 22–32)
Calcium: 9.3 mg/dL (ref 8.9–10.3)
Chloride: 104 mmol/L (ref 98–111)
Creatinine, Ser: 1.14 mg/dL (ref 0.61–1.24)
GFR calc Af Amer: >60 mL/min (ref >60)
GFR calc non Af Amer: 58 mL/min — ABNORMAL LOW (ref >60)
Glucose, Bld: 42 mg/dL — CL (ref 70–99)
Potassium: 6.2 mmol/L — ABNORMAL HIGH (ref 3.5–5.1)
Sodium: 135 mmol/L (ref 135–145)

## 2018-08-12 LAB — CBC
HCT: 41.7 % (ref 39.0–52.0)
Hemoglobin: 13.6 g/dL (ref 13.0–17.0)
MCH: 31.1 pg (ref 26.0–34.0)
MCHC: 32.6 g/dL (ref 30.0–36.0)
MCV: 95.4 fL (ref 80.0–100.0)
Platelets: 196 10*3/uL (ref 150–400)
RBC: 4.37 MIL/uL (ref 4.22–5.81)
RDW: 13.7 % (ref 11.5–15.5)
WBC: 7.4 10*3/uL (ref 4.0–10.5)
nRBC: 0 % (ref 0.0–0.2)

## 2018-08-12 NOTE — Progress Notes (Signed)
ReDS Vest - 08/12/18 1249      ReDS Vest   Estimated volume prior to reading  Low    Fitting Posture  Sitting    Height Marker  Short    Ruler Value  27.5    Center Strip  Aligned    ReDS Value  [place] C

## 2018-08-12 NOTE — Patient Instructions (Signed)
Routine lab work today. Will notify you of abnormal results  Follow up in 3 months with Dr.McLean  

## 2018-08-12 NOTE — Telephone Encounter (Signed)
Received call from Dr Aundra Dubin, he states pt's K was 6.2 on labs today, he wants pt to stop KCL and Sprio and have stat repeat tonight.    Spoke w/pt and his aware, he states that they can draw labs at his facility  Called and spoke w/clinic nurse [phone]) she states it is getting late but she believes they can draw lab tonight.  Order faxed to them at [phone], confirmation received.

## 2018-08-12 NOTE — Telephone Encounter (Signed)
The lab called to report critical glucose (42). I called pt to see how he felt and see if there was anyway he could recheck at home. No answer left VM.   Message also routed to provider for advice.

## 2018-08-14 LAB — CUP PACEART REMOTE DEVICE CHECK
Battery Remaining Longevity: 55 mo
Battery Remaining Percentage: 57 %
Battery Voltage: 2.89 V
Date Time Interrogation Session: 20200605163727
Implantable Lead Implant Date: 20140422
Implantable Lead Implant Date: 20140424
Implantable Lead Implant Date: 20140424
Implantable Lead Location: 753858
Implantable Lead Location: 753859
Implantable Lead Location: 753860
Implantable Lead Model: 5076
Implantable Lead Model: 5076
Implantable Pulse Generator Implant Date: 20140422
Lead Channel Impedance Value: 410 Ohm
Lead Channel Impedance Value: 990 Ohm
Lead Channel Pacing Threshold Amplitude: 0.75 V
Lead Channel Pacing Threshold Amplitude: 1 V
Lead Channel Pacing Threshold Pulse Width: 0.5 ms
Lead Channel Pacing Threshold Pulse Width: 0.5 ms
Lead Channel Sensing Intrinsic Amplitude: 9.5 mV
Lead Channel Setting Pacing Amplitude: 2.5 V
Lead Channel Setting Pacing Amplitude: 2.5 V
Lead Channel Setting Pacing Pulse Width: 0.5 ms
Lead Channel Setting Pacing Pulse Width: 0.5 ms
Lead Channel Setting Sensing Sensitivity: 4 mV
Pulse Gen Model: 3210
Pulse Gen Serial Number: 2926516

## 2018-08-14 NOTE — Progress Notes (Signed)
Advanced Heart Failure Clinic Note  Patient ID: [name], male   DOB: [birth year], 85 y.o.   MRN: [id number] PCP: Dr. Yong Channel Cardiology: Dr. Gerarda Fraction [name] is a 85 y.o. male with history of paroxysmal atrial fibrillation, nonischemic cardiomyopathy, and complete heart block with St Jude CRT-P system presents for cardiology followup.  He was admitted in 4/14 with pacemaker pocket infection.  His pacemaker was removed and temporary-permanent device was placed.  Later, he had the temporary-permanent device removed and a new CRT-P device was placed. He developed dyspnea post-operatively and was found to have a large right-sided pleural effusion.  He was admitted and got a chest tube for drainage. He had followup with Dr. Servando Snare and it was decided that he would not need VATS.  He had been on amiodarone for maintenance of NSR.  This had been more successful than Tikosyn.   Around 3/15, he started developing increasing exertional dyspnea.  He was short of breath walking up a hill or incline.  He could still walk on the treadmill for exercise for 10-15 minutes and use the elliptical without much trouble on most days.  No orthopnea or PND.  No chest pain.  He saw Dr Leanne Chang and was started on Lasix three times a week due to concern for volume overload.  This did not seem to have helped much.  After that, he had PFTs done which showed normal spirometry but DLCO 50% predicted.  Therefore, amiodarone was stopped due to concern for lung toxicity.  He saw Richardson Dopp for a pre-operative evaluation prior to right inguinal hernia repair.  Given the exertional dyspnea, he was set up for adenosine Cardiolite in 5/15.  This showed EF 36% with a small area of primarily scar in the apex, apical lateral wall, and mid anterolateral wall.  He has not felt palpitations. Echo in 5/15 showed EF 40-45%, similar to prior study.   I had him see pulmonary for evaluation for amiodarone lung toxicity.  CT chest  looked ok, and he was not thought to have amiodarone lung toxicity.   At a prior appointment in 2016, [name] was noted to be back in atrial fibrillation.  He was more short of breath with exertion and fatigued.  He has historically tolerated atrial fibrillation poorly.  I did a TEE-guided DCCV in 5/16 back to NSR.  TEE showed EF 35-40%.  He did not immediately feel better like he has in the past post-DCCV, so Lasix was increased.    Recurrent symptomatic atrial fibrillation in 8/17 with TEE-guided DCCV to NSR.  TEE showed EF 25-30%.  Recurrent symptomatic atrial fibrillation with CHF exacerbation in 9/17.  He had repeat DCCV and Lasix was increased.   He was back in atrial fibrillation in 11/17 and appears to have been out of rhythm since then.  He was more short of breath in atrial fibrillation with a significant fall in BiV pacing percentage.  Lasix was significantly increased to try to cope with volume overload.  In 2/18, he had AV nodal ablation to promote BiV pacing.    Admitted 5/19 with right eye lower visual field loss, found to have severe right carotid stenosis. S/p R CEA on 07/26/17. Switched from coumadin to Eliquis. Volume was stable.  Echo in 5/19 showed EF 30-35%, mild AI.   Echo done today and reviewed showed EF 30-35% with mildly decreased RV systolic function.   Today, he presents for followup of CHF. Weight is stable. Recently, he had  noticed increased exertional dyspnea.  Lasix was increased to 80 qam/40 qpm and he has felt better since then.  He is stably short of breath if he walks up a hill.  He walks for exercise with no problems on flat ground.  No orthopnea/PND.  No chest pain.  No lightheadedness.   REDS clip 28%   Labs (5/19): LDL 72, HDL 41, K 4.2, creatinine 0.86 Labs (10/19): K 3.9, creatinine 0.96 Labs (12/19): K 4.4, creatinine 0.99, LDL 63, HDl 41 Labs (3/20): K 4.4, creatinine 1.03  St Jude device interrogation:  93% BiV pacing, no VT.   PMH: 1. Low  back pain 2. Hyperlipidemia 3. GERD 4. BPH 5. Polymyalgia rheumatica 6. Atrial fibrillation: Failed Tikosyn in the past.  Paroxysmal, on amiodarone and warfarin.  DCCV in 4/13, 7/13, 10/13.  Has held NSR with amiodarone.  However, concern for amiodarone lung toxicity: PFTs (4/15) with FVC 90% predicted, FEV1 90%, ratio 97%, DLCO 50%.   Recurrence of atrial fibrillation in 5/16, TEE-guided DCCV back to NSR in 5/16.  - Recurrence of atrial fibrillation in 8/17. TEE-guided DCCV 11/07/15.  - Recurrence of atrial fibrillation 9/17.  DCCV 11/20/15 but back in atrial fibrillation by 11/26/15.  - Amiodarone stopped due to persistent atrial fibrillation.  - AV nodal ablation in 2/18 to promote BiV pacing.  7. Transaminitis: Mild, attributed to amiodarone.  8. Complete heart block s/p St Jude CRT-P device.  Patient developed PCM pocket infection in 4/14 and had his first system removed with placement of a temporary permanent PCM.  He later had CRT-P device replaced.  This was complicated by large right-sided hemorrhagic pleural effusion requiring chest tube.   9. Nonischemic cardiomyopathy: Echo (9/13) with EF 40-45%, diffuse HK, mild Craig.  Mild CAD only on prior LHC.  Echo (4/14) with EF 45-50%.  Adenosine Cardiolite (5/15) with EF 36% (visually appeared higher per report), small area of scar with peri-infarct ischemia in the apex, apical lateral wall, and mid anterolateral wall. Echo (5/15) with EF 40-45%, basal to mid inferolateral severe hypokinesis, basal to mid anterolateral hypokinesis, mildly dilated RV with mildly decreased RV systolic function, mild to moderate Craig.  TEE (5/16) with EF 35-40%, inferior and inferolateral severe hypokinesis, normal RV size with mildly decreased systolic function, moderate Craig, peak RV-RA gradient 26 mmHg.  - TEE (8/17): EF 25-30%, mildly dilated RV with mildly decreased systolic function, moderate biatrial enlargement, moderate central Craig.  - AV nodal ablation 2/18.  - Echo  (5/19): EF 30-35%, mild AI.  - Echo (6/20): EF 30-35%, mild LV dilation, mild RV enlargement with mildly decreased systolic function, mild Craig.  10. Large right pleural effusion requiring chest tube following removal and reimplantation of PCM. Most recent chest CT in 6/15 showed significant decrease in size of loculated pleural effusion since 5/14.   11. Right CEA 5/19.  12. Right central retinal artery occlusion in 5/19.   SH: Married, prior smoker (quit 1985), no ETOH x years, retired, lives at [employer]  [place]: Father with MI at 56, mother with AAA.   Review of systems complete and found to be negative unless listed in HPI.   Current Outpatient Medications  Medication Sig Dispense Refill  . acetaminophen (TYLENOL) 500 MG tablet Take 1,000 mg by mouth every 6 (six) hours as needed for moderate pain or headache.    Marland Kitchen apixaban (ELIQUIS) 5 MG TABS tablet Take 1 tablet (5 mg total) by mouth 2 (two) times daily. 180 tablet 2  . dutasteride (  AVODART) 0.5 MG capsule Take 0.5 mg by mouth daily.     . fluticasone (FLONASE) 50 MCG/ACT nasal spray Place 2 sprays into both nostrils daily as needed for allergies or rhinitis. 16 g 3  . furosemide (LASIX) 80 MG tablet Take 80mg  every morning and 40mg  every evening. 135 tablet 3  . hydroxypropyl methylcellulose / hypromellose (ISOPTO TEARS / GONIOVISC) 2.5 % ophthalmic solution Place 1 drop into both eyes 3 (three) times daily as needed for dry eyes.     Marland Kitchen losartan (COZAAR) 25 MG tablet Take 0.5 tablets (12.5 mg total) by mouth daily. 45 tablet 3  . metoprolol succinate (TOPROL-XL) 25 MG 24 hr tablet Take 1 tablet (25 mg total) by mouth daily. 90 tablet 2  . potassium chloride SA (KLOR-CON M20) 20 MEQ tablet Take 1 tablet (20 mEq total) by mouth 2 (two) times daily. 180 tablet 3  . rosuvastatin (CRESTOR) 10 MG tablet Take 1 tablet (10 mg total) by mouth at bedtime. 90 tablet 3  . spironolactone (ALDACTONE) 25 MG tablet Take 1 tablet (25 mg total) by mouth  daily. 90 tablet 3  . tamsulosin (FLOMAX) 0.4 MG CAPS capsule Take 0.4 mg by mouth 2 (two) times daily.      No current facility-administered medications for this encounter.     BP 116/61   Pulse 69   Wt 62 kg (136 lb 9.6 oz)   SpO2 98%   BMI 22.73 kg/m   Wt Readings from Last 3 Encounters:  08/12/18 62 kg (136 lb 9.6 oz)  07/26/18 68.5 kg (151 lb)  06/03/18 68.2 kg (150 lb 4.8 oz)   General: NAD Neck: No JVD, no thyromegaly or thyroid nodule.  Lungs: Clear to auscultation bilaterally with normal respiratory effort. CV: Nondisplaced PMI.  Heart regular S1/S2, no S3/S4, no murmur.  No peripheral edema.  No carotid bruit.  Normal pedal pulses.  Abdomen: Soft, nontender, no hepatosplenomegaly, no distention.  Skin: Intact without lesions or rashes.  Neurologic: Alert and oriented x 3.  Psych: Normal affect. Extremities: No clubbing or cyanosis.  HEENT: Normal.   Assessment/Plan: 1. Chronic systolic CHF:  Presumed nonischemic cardiomyopathy (prior cath with only mild CAD).  He had an adenosine Cardiolite in 5/15 with EF 36% and possible small area of apical and lateral scar with peri-infarct ischemia. He has St Jude CRT-P.  Echo done today and reviewed with EF 30-35%. NYHA class II symptoms, stable.  He is not volume overloaded by exam or REDS clip reading.    - Continue Lasix 80 qam/40 qpm. BMET today.  - Continue Toprol XL 25 mg daily.  - Continue spironolactone 25 mg daily.   - Continue losartan 12.5 mg daily.   2. Atrial fibrillation: Now persistent.  Had breakthrough with Tikosyn and now not able to hold NSR on amiodarone. He is now off amiodarone.  He is status post AV nodal ablation with significant improvement in symptoms and BiV pacing percentage (93% today, suspect mildly low due to PVCs).  - Continue apixiban 5 mg BID. CBC today.  3. Hyperlipidemia: History of nonobstructive CAD and recent CEA.    - Continue Crestor, good lipids in 12/19.  4. Rt CEA occlusion s/p R CEA  07/26/17: He had an ulcerated, 70% stenotic plaque in the right internal carotid. Suspect this is the source of embolism for CRAO.     - Continue statin.  - Followed at [facility].   Followup in 3 months  [name], MD 08/14/2018

## 2018-08-16 ENCOUNTER — Encounter (HOSPITAL_COMMUNITY): Payer: Self-pay

## 2018-08-17 ENCOUNTER — Other Ambulatory Visit: Payer: Self-pay

## 2018-08-17 ENCOUNTER — Telehealth (INDEPENDENT_AMBULATORY_CARE_PROVIDER_SITE_OTHER): Payer: Medicare Other | Admitting: Neurology

## 2018-08-17 ENCOUNTER — Encounter: Payer: Self-pay | Admitting: Cardiology

## 2018-08-17 ENCOUNTER — Telehealth (HOSPITAL_COMMUNITY): Payer: Self-pay | Admitting: *Deleted

## 2018-08-17 ENCOUNTER — Encounter: Payer: Self-pay | Admitting: Neurology

## 2018-08-17 VITALS — Ht 66.0 in | Wt 150.0 lb

## 2018-08-17 DIAGNOSIS — E875 Hyperkalemia: Secondary | ICD-10-CM

## 2018-08-17 DIAGNOSIS — G629 Polyneuropathy, unspecified: Secondary | ICD-10-CM | POA: Diagnosis not present

## 2018-08-17 MED ORDER — SPIRONOLACTONE 25 MG PO TABS: 12.5000 mg | ORAL_TABLET | Freq: Every day | ORAL | 3 refills | Status: DC

## 2018-08-17 NOTE — Telephone Encounter (Signed)
Repeat labs drawn 6/5 show K 5.0, per Dr Aundra Dubin pt should remain off KCL, restart Spironolactone at 12.5 mg (1/2 tab) daily and get repeat bmet in 1 week.  Attempted to call pt and Left message to call back

## 2018-08-17 NOTE — Telephone Encounter (Signed)
Spoke w/pt, he is aware and verbalizes understanding of med changes, he will come for labs on Tue 6/16, order placed and med list updated

## 2018-08-17 NOTE — Progress Notes (Signed)
Remote pacemaker transmission.   

## 2018-08-17 NOTE — Progress Notes (Signed)
   Virtual Visit via Video Note The purpose of this virtual visit is to provide medical care while limiting exposure to the novel coronavirus.    Consent was obtained for video visit:  Yes.   Answered questions that patient had about telehealth interaction:  Yes.   I discussed the limitations, risks, security and privacy concerns of performing an evaluation and management service by telemedicine. I also discussed with the patient that there may be a patient responsible charge related to this service. The patient expressed understanding and agreed to proceed.  Pt location: Home Physician Location: office Name of referring provider:  Larey Dresser, MD I connected with [name] at patients initiation/request on 08/17/2018 at 10:10 AM EDT by video enabled telemedicine application and verified that I am speaking with the correct person using two identifiers. Pt MRN:  [id number] Pt DOB:  Apr 19, 1929 Video Participants:  [name]   History of Present Illness: This is a 85 y.o. male returning for follow-up of idiopathic neuropathy.  He was last evaluated in June 2017.  Over the past 3 years, he feels that his neuropathy has remained stable, if anything, his balance is slightly improved.  His numbness remains below the mid-calf and into the feet.  He rarely has any painful tingling or pain in the legs.  He does not have any numbness or tingling in the hands.  He feels that his balance may be slightly improved because of doing balance exercises.  He walks unassisted and has not suffered any falls.  He has mild increase in imbalance when his eyes are closed, such as when washing his face.  He continues to live at [facility].  Observations/Objective:   Vitals:   08/17/18 0816  Weight: 150 lb (68 kg)  Height: 5\' 6"  (1.676 m)   Patient is awake, alert, and appears comfortable.  Oriented x 4.   Extraocular muscles are intact. No ptosis.  Face is symmetric.  Speech is not dysarthric.  Tongue is midline. Antigravity in all extremities.  No pronator drift. Gait appears, mildly wide based, stable and unassisted.  He can easily stand on toes.    Assessment and Plan:  Idiopathic peripheral neuropathy manifesting with numbness below the mid-calf and sensory ataxia.  Symptoms have not progressed over the past several years, which is reassuring.  He denies any pain or weakness.  Recommend that he start to use a cane anytime when walking on uneven ground, daily foot inspection, and use hand rails in the shower when his eyes are closed. Fortunately, his has not had any falls and is compliant with his balance exercises.   Fall precautions were stressed, especially as he is on anticoagulation.     Follow Up Instructions:   I discussed the assessment and treatment plan with the patient. The patient was provided an opportunity to ask questions and all were answered. The patient agreed with the plan and demonstrated an understanding of the instructions.   The patient was advised to call back or seek an in-person evaluation if the symptoms worsen or if the condition fails to improve as anticipated.  Follow-up in 1 year or sooner as needed  Total time spent:  20 minutes     Alda Berthold, DO

## 2018-08-19 ENCOUNTER — Other Ambulatory Visit (HOSPITAL_COMMUNITY): Payer: Self-pay | Admitting: Cardiology

## 2018-08-23 ENCOUNTER — Other Ambulatory Visit: Payer: Self-pay

## 2018-08-23 ENCOUNTER — Ambulatory Visit (HOSPITAL_COMMUNITY)
Admission: RE | Admit: 2018-08-23 | Discharge: 2018-08-23 | Disposition: A | Discharge status: Home / Self Care | Payer: Medicare Other | Source: Ambulatory Visit | Attending: Internal Medicine | Admitting: Internal Medicine

## 2018-08-23 ENCOUNTER — Encounter (HOSPITAL_COMMUNITY): Payer: Self-pay

## 2018-08-23 DIAGNOSIS — E875 Hyperkalemia: Secondary | ICD-10-CM | POA: Diagnosis not present

## 2018-08-23 LAB — BASIC METABOLIC PANEL
Anion gap: 8 (ref 5–15)
BUN: 19 mg/dL (ref 8–23)
CO2: 27 mmol/L (ref 22–32)
Calcium: 9.3 mg/dL (ref 8.9–10.3)
Chloride: 103 mmol/L (ref 98–111)
Creatinine, Ser: 0.94 mg/dL (ref 0.61–1.24)
GFR calc Af Amer: >60 mL/min (ref >60)
GFR calc non Af Amer: >60 mL/min (ref >60)
Glucose, Bld: 80 mg/dL (ref 70–99)
Potassium: 3.7 mmol/L (ref 3.5–5.1)
Sodium: 138 mmol/L (ref 135–145)

## 2018-08-29 ENCOUNTER — Other Ambulatory Visit (HOSPITAL_COMMUNITY): Payer: Self-pay | Admitting: *Deleted

## 2018-08-29 MED ORDER — FUROSEMIDE 80 MG PO TABS: ORAL_TABLET | ORAL | 3 refills | Status: DC

## 2018-08-31 ENCOUNTER — Ambulatory Visit: Payer: Medicare Other | Admitting: Neurology

## 2018-09-05 ENCOUNTER — Other Ambulatory Visit: Payer: Self-pay

## 2018-09-05 ENCOUNTER — Ambulatory Visit (INDEPENDENT_AMBULATORY_CARE_PROVIDER_SITE_OTHER): Payer: Medicare Other | Admitting: Family Medicine

## 2018-09-05 ENCOUNTER — Encounter: Payer: Self-pay | Admitting: Family Medicine

## 2018-09-05 VITALS — BP 104/60 | HR 60 | Temp 97.7°F | Ht 65.0 in | Wt 152.4 lb

## 2018-09-05 DIAGNOSIS — I4891 Unspecified atrial fibrillation: Secondary | ICD-10-CM | POA: Diagnosis not present

## 2018-09-05 DIAGNOSIS — I5022 Chronic systolic (congestive) heart failure: Secondary | ICD-10-CM

## 2018-09-05 DIAGNOSIS — I2510 Atherosclerotic heart disease of native coronary artery without angina pectoris: Secondary | ICD-10-CM

## 2018-09-05 DIAGNOSIS — I6529 Occlusion and stenosis of unspecified carotid artery: Secondary | ICD-10-CM | POA: Diagnosis not present

## 2018-09-05 DIAGNOSIS — K219 Gastro-esophageal reflux disease without esophagitis: Secondary | ICD-10-CM

## 2018-09-05 DIAGNOSIS — E785 Hyperlipidemia, unspecified: Secondary | ICD-10-CM

## 2018-09-05 LAB — CBC WITH DIFFERENTIAL/PLATELET
Basophils Absolute: 0 10*3/uL (ref 0.0–0.1)
Basophils Relative: 0.4 % (ref 0.0–3.0)
Eosinophils Absolute: 0.1 10*3/uL (ref 0.0–0.7)
Eosinophils Relative: 1.6 % (ref 0.0–5.0)
HCT: 39.5 % (ref 39.0–52.0)
Hemoglobin: 13.3 g/dL (ref 13.0–17.0)
Lymphocytes Relative: 26.3 % (ref 12.0–46.0)
Lymphs Abs: 1.7 10*3/uL (ref 0.7–4.0)
MCHC: 33.7 g/dL (ref 30.0–36.0)
MCV: 92.3 fl (ref 78.0–100.0)
Monocytes Absolute: 0.6 10*3/uL (ref 0.1–1.0)
Monocytes Relative: 9.1 % (ref 3.0–12.0)
Neutro Abs: 3.9 10*3/uL (ref 1.4–7.7)
Neutrophils Relative %: 62.6 % (ref 43.0–77.0)
Platelets: 211 10*3/uL (ref 150.0–400.0)
RBC: 4.28 Mil/uL (ref 4.22–5.81)
RDW: 14.3 % (ref 11.5–15.5)
WBC: 6.3 10*3/uL (ref 4.0–10.5)

## 2018-09-05 LAB — TSH: TSH: 1.49 u[IU]/mL (ref 0.35–4.50)

## 2018-09-05 MED ORDER — FAMOTIDINE 20 MG PO TABS: 20.0000 mg | ORAL_TABLET | Freq: Two times a day (BID) | ORAL | 5 refills | Status: DC | PRN

## 2018-09-05 NOTE — Patient Instructions (Addendum)
With mild reflux flare not responsive to Tums- lets try pepcid twice a day with meals for 5-7 days then can go back to just tums and see if that works- if not can continue pepcid. If neither work- please let me know  For fatigue lets update labs including potassium and thyroid and blood counts Please stop by lab before you go If you do not have mychart- we will call you about results within 5 business days of [place] receiving them.  If you have mychart- we will send your results within 3 business days of [place] receiving them.  If abnormal or we want to clarify a result, we will call or mychart you to make sure you receive the message.  If you have questions or concerns or don't hear within 5-7 days, please send [place] a message or call us.

## 2018-09-05 NOTE — Progress Notes (Signed)
Phone [phone]   Subjective:  [name] is a 85 y.o. year old very pleasant male patient who presents for/with See problem oriented charting Chief Complaint  Patient presents with  . Atrial Fibrillation  . Gastroesophageal Reflux   ROS- no chest pain. Does have some shortness of breath after meals and if really pushes himself but not worsening over last 3 months and cardiology aware. No cough or fever reported.    Past Medical History-  Patient Active Problem List   Diagnosis Date Noted  . History of central retinal artery occlusion 08/04/2017    Priority: High  . Stenosis of right internal carotid artery s/p endarterectomy 07/2017     Priority: High  . Chronic systolic CHF (congestive heart failure) ([place]) 03/21/2013    Priority: High  . Nonischemic cardiomyopathy ([place]) 09/20/2010    Priority: High  . PPM-St.Jude 11/27/2008    Priority: High  . CAD (coronary artery disease) 07/20/2008    Priority: High  . SICK SINUS SYNDROME 07/20/2008    Priority: High  . History of polymyalgia rheumatica 07/03/2008    Priority: High  . Atrial fibrillation ([place]) 03/16/2007    Priority: High  . AK (actinic keratosis) 01/28/2016    Priority: Medium  . Lipoma 07/08/2015    Priority: Medium  . Neuropathy 05/16/2015    Priority: Medium  . Chronic cough 08/18/2013    Priority: Medium  . Pleural effusion, right 08/16/2013    Priority: Medium  . Pleural effusion on right 07/06/2012    Priority: Medium  . Hyperlipidemia 03/16/2007    Priority: Medium  . BPH (benign prostatic hyperplasia) 03/16/2007    Priority: Medium  . Thrombus of left atrial appendage without antecedent myocardial infarction 07/24/2017    Priority: Low  . Traumatic hematoma 04/01/2017    Priority: Low  . Left knee pain 10/23/2016    Priority: Low  . Prolapsed internal hemorrhoids, grade 2 - causing fecal seepage 06/10/2016    Priority: Low  . Chronic anticoagulation 02/10/2016    Priority: Low  .  Left breast lump 07/15/2015    Priority: Low  . Trigger thumb of right hand 07/04/2014    Priority: Low  . Encounter for therapeutic drug monitoring 07/02/2014    Priority: Low  . Allergic rhinitis 02/14/2014    Priority: Low  . Fecal incontinence 10/18/2013    Priority: Low  . Recurrent right inguinal hernia 11/22/2012    Priority: Low  . Intention tremor 11/01/2012    Priority: Low  . MIXED HEARING LOSS BILATERAL 01/10/2008    Priority: Low  . GERD 03/16/2007    Priority: Low  . Incomplete passage of stool 04/06/2018    Medications- reviewed and updated Current Outpatient Medications  Medication Sig Dispense Refill  . acetaminophen (TYLENOL) 500 MG tablet Take 1,000 mg by mouth every 6 (six) hours as needed for moderate pain or headache.    Marland Kitchen apixaban (ELIQUIS) 5 MG TABS tablet Take 1 tablet (5 mg total) by mouth 2 (two) times daily. 180 tablet 2  . dutasteride (AVODART) 0.5 MG capsule Take 0.5 mg by mouth daily.     . fluticasone (FLONASE) 50 MCG/ACT nasal spray Place 2 sprays into both nostrils daily as needed for allergies or rhinitis. 16 g 3  . furosemide (LASIX) 80 MG tablet Take 80mg  in the AM and 40mg  in the PM 135 tablet 3  . hydroxypropyl methylcellulose / hypromellose (ISOPTO TEARS / GONIOVISC) 2.5 % ophthalmic solution Place 1 drop into both  eyes 3 (three) times daily as needed for dry eyes.     Marland Kitchen losartan (COZAAR) 25 MG tablet Take 0.5 tablets (12.5 mg total) by mouth daily. 45 tablet 3  . metoprolol succinate (TOPROL-XL) 25 MG 24 hr tablet Take 1 tablet (25 mg total) by mouth daily. 90 tablet 2  . rosuvastatin (CRESTOR) 10 MG tablet Take 1 tablet (10 mg total) by mouth at bedtime. 90 tablet 3  . spironolactone (ALDACTONE) 25 MG tablet Take 0.5 tablets (12.5 mg total) by mouth daily. 90 tablet 3  . tamsulosin (FLOMAX) 0.4 MG CAPS capsule Take 0.4 mg by mouth 2 (two) times daily.     . famotidine (PEPCID) 20 MG tablet Take 1 tablet (20 mg total) by mouth 2 (two) times  daily as needed for heartburn or indigestion. 60 tablet 5   No current facility-administered medications for this visit.      Objective:  BP 104/60 (BP Location: Left Arm, Patient Position: Sitting, Cuff Size: Normal)   Pulse 60   Temp 97.7 F (36.5 C) (Oral)   Ht 5\' 5"  (1.651 m)   Wt 152 lb 6.4 oz (69.1 kg)   SpO2 94%   BMI 25.36 kg/m  Gen: NAD, resting comfortably CV: RRR no murmurs rubs or gallops Lungs: CTAB no crackles, wheeze, rhonchi Abdomen: soft/nontender/nondistended/normal bowel sounds. No rebound or guarding.  Ext: no edema Skin: warm, dry Neuro: grossly normal, moves all extremities     Assessment and Plan   #GERD S:for 2-3 months has felt a slight loss of appetite. Occasionally he will have reflux but usually with bigger meals or certain foods. Tums has been helpful typically. Since last visit Weight up 2 lbs so loss of appetite has not resulted in weight loss. In last 2 weeks has had bad reflux after lunch and tums don't help him as much and just feels like appetite down a good bit. Ends up next morning not even wanting a full breakfast which is unusual for him. Feels like energy is lower and just doesn't feel great over last 24 hours. Working with Dr. Aundra Dubin on shortness of breath and that is pretty stable but perhaps mildly worse. Admits more sedentary- gym closed, pool closed and that has been hard- sedentary activity could be contributing- does closed circuit tv with exercises about 3 days a week and walking other 2 days. SOB has worsened slightly over last year but about the same over last 3 months. Still ok on flat surfaces. Feels more SOB if goes from sitting to active- better the more active he is.   No known covid 19 contacts. No recent cases at [facility] where he lives.  Wt Readings from Last 3 Encounters:  09/05/18 152 lb 6.4 oz (69.1 kg)  08/17/18 150 lb (68 kg)  08/12/18 136 lb 9.6 oz (62 kg)  A/P: Sounds like poorly controlled GERD_ trial 5-7 days of  pepcid BID to see if this calms things down- can resume tums after that if needed. If this is not effective he will let us know- has had close follow up with cardiology and doubt reflux related to any cardiac issues  Given he feels somewhat run down will update labs like cbc, cmp, tsh- especially with him recently coming off potassium due to hyperkalemia.  - did not repeat b12 as in normal range within a few months.    #Atrial fibrillation S: Patient is compliant with Eliquis.  History of central retinal artery occlusion-though carotid stenosis may have been the  source of stroke.  Rate controlled on metoprolol.  He was on amiodarone in the past after not doing well on Tikosyn-off both at present.  He has a pacemaker with biventricular pacing. A/P: Stable. Continue current medications.    #hypertension/chronic systolic heart failure-last EF 30 to 35% S: controlled on Lasix 80 mg in the morning and 40 mg in the evening, losartan 12.5 mg, metoprolol 25 mg extended release, spironolactone 12.5 mg  Denies significant weight gain, edema, increased shortness of breath in last 3 months (though is worse over last year and working with cardiology as above) BP Readings from Last 3 Encounters:  09/05/18 104/60  08/12/18 116/61  07/26/18 (!) 97/59  A/P: Stable x2. Continue current medications.    #hyperlipidemia/history of nonobstructive CAD with also history of carotid endarterectomy S: Well controlled on rosuvastatin 10 mg.  Not on aspirin right now is on Eliquis- follows up with vein and vascular as well.  Denies chest pain or increased shortness of breath.  Lab Results  Component Value Date   CHOL 116 03/04/2018   HDL 41 03/04/2018   LDLCALC 63 03/04/2018   LDLDIRECT 69.0 06/15/2017   TRIG 61 03/04/2018   CHOLHDL 2.8 03/04/2018   A/P:  Stable. Continue current medications.     #BPH- managed by urology on Flomax and dutasteride  Future Appointments  Date Time Provider [place]   10/28/2018 11:45 AM Evans Lance, MD [facility]  11/08/2018  7:50 AM [facility] DEVICE REMOTES [facility]  11/18/2018 10:20 AM Larey Dresser, MD [facility] None  08/18/2019 10:30 AM Narda Amber K, DO [facility] None   Lab/Order associations:   ICD-10-CM   1. Atrial fibrillation, unspecified type ([place])  I48.91   2. Hyperlipidemia, unspecified hyperlipidemia type  E78.5 CBC with Differential/Platelet    Comprehensive metabolic panel    TSH  3. Coronary artery disease involving native coronary artery of native heart without angina pectoris  I25.10   4. Chronic systolic CHF (congestive heart failure) ([facility])  I50.22   5. Gastroesophageal reflux disease without esophagitis  K21.9   6. Neuropathy  G62.9     Meds ordered this encounter  Medications  . famotidine (PEPCID) 20 MG tablet    Sig: Take 1 tablet (20 mg total) by mouth 2 (two) times daily as needed for heartburn or indigestion.    Dispense:  60 tablet    Refill:  5    Return precautions advised.  Garret Reddish, MD

## 2018-09-06 LAB — COMPREHENSIVE METABOLIC PANEL
ALT: 14 U/L (ref 0–53)
AST: 20 U/L (ref 0–37)
Albumin: 4.2 g/dL (ref 3.5–5.2)
Alkaline Phosphatase: 62 U/L (ref 39–117)
BUN: 18 mg/dL (ref 6–23)
CO2: 24 mEq/L (ref 19–32)
Calcium: 9.2 mg/dL (ref 8.4–10.5)
Chloride: 101 mEq/L (ref 96–112)
Creatinine, Ser: 0.97 mg/dL (ref 0.40–1.50)
GFR: 73.16 mL/min (ref >60.00)
Glucose, Bld: 90 mg/dL (ref 70–99)
Potassium: 3.8 mEq/L (ref 3.5–5.1)
Sodium: 139 mEq/L (ref 135–145)
Total Bilirubin: 1.2 mg/dL (ref 0.2–1.2)
Total Protein: 6.7 g/dL (ref 6.0–8.3)

## 2018-09-29 ENCOUNTER — Ambulatory Visit (INDEPENDENT_AMBULATORY_CARE_PROVIDER_SITE_OTHER): Payer: Medicare Other | Admitting: Family Medicine

## 2018-09-29 ENCOUNTER — Other Ambulatory Visit: Payer: Self-pay

## 2018-09-29 ENCOUNTER — Encounter: Payer: Self-pay | Admitting: Family Medicine

## 2018-09-29 VITALS — BP 90/60 | HR 69 | Temp 97.9°F | Ht 65.0 in | Wt 152.6 lb

## 2018-09-29 DIAGNOSIS — M25511 Pain in right shoulder: Secondary | ICD-10-CM | POA: Diagnosis not present

## 2018-09-29 DIAGNOSIS — I6529 Occlusion and stenosis of unspecified carotid artery: Secondary | ICD-10-CM

## 2018-09-29 DIAGNOSIS — R3914 Feeling of incomplete bladder emptying: Secondary | ICD-10-CM | POA: Diagnosis not present

## 2018-09-29 DIAGNOSIS — R3912 Poor urinary stream: Secondary | ICD-10-CM | POA: Diagnosis not present

## 2018-09-29 DIAGNOSIS — M546 Pain in thoracic spine: Secondary | ICD-10-CM | POA: Diagnosis not present

## 2018-09-29 DIAGNOSIS — L989 Disorder of the skin and subcutaneous tissue, unspecified: Secondary | ICD-10-CM

## 2018-09-29 DIAGNOSIS — N401 Enlarged prostate with lower urinary tract symptoms: Secondary | ICD-10-CM | POA: Diagnosis not present

## 2018-09-29 MED ORDER — TRIAMCINOLONE ACETONIDE 0.1 % EX CREA: 1.0000 "application " | TOPICAL_CREAM | Freq: Two times a day (BID) | CUTANEOUS | 0 refills | Status: DC

## 2018-09-29 NOTE — Patient Instructions (Addendum)
For back issues-I want you to try heat 3-4x a day for 20 minutes for 3 days- may need to continue past then. Tylenol is a good choice and lower risk as well.   For spot on leg- will trial triamcinolone to break him out of itch/scratch cycle and if fails to improve he can follow up with dermatology.

## 2018-09-29 NOTE — Progress Notes (Signed)
Phone [phone]   Subjective:  [name] is a 85 y.o. year old very pleasant male patient who presents for/with See problem oriented charting Chief Complaint  Patient presents with  . Rash    Pt noticed a bump on inner R thigh 6 months ago w/o relief. Pt stated he thought it was a insect bite but the bump is still present and itchy   . Arm Pain    pt also c/o shoulder blade pain. Sx started 2 months.    ROS-no fever/chills/cough/sore throat reported  Past Medical History-  Patient Active Problem List   Diagnosis Date Noted  . History of central retinal artery occlusion 08/04/2017    Priority: High  . Stenosis of right internal carotid artery s/p endarterectomy 07/2017     Priority: High  . Chronic systolic CHF (congestive heart failure) ([place]) 03/21/2013    Priority: High  . Nonischemic cardiomyopathy ([place]) 09/20/2010    Priority: High  . PPM-St.Jude 11/27/2008    Priority: High  . CAD (coronary artery disease) 07/20/2008    Priority: High  . SICK SINUS SYNDROME 07/20/2008    Priority: High  . History of polymyalgia rheumatica 07/03/2008    Priority: High  . Atrial fibrillation ([place]) 03/16/2007    Priority: High  . AK (actinic keratosis) 01/28/2016    Priority: Medium  . Lipoma 07/08/2015    Priority: Medium  . Neuropathy 05/16/2015    Priority: Medium  . Chronic cough 08/18/2013    Priority: Medium  . Pleural effusion, right 08/16/2013    Priority: Medium  . Pleural effusion on right 07/06/2012    Priority: Medium  . Hyperlipidemia 03/16/2007    Priority: Medium  . BPH (benign prostatic hyperplasia) 03/16/2007    Priority: Medium  . Thrombus of left atrial appendage without antecedent myocardial infarction 07/24/2017    Priority: Low  . Traumatic hematoma 04/01/2017    Priority: Low  . Left knee pain 10/23/2016    Priority: Low  . Prolapsed internal hemorrhoids, grade 2 - causing fecal seepage 06/10/2016    Priority: Low  . Chronic  anticoagulation 02/10/2016    Priority: Low  . Left breast lump 07/15/2015    Priority: Low  . Trigger thumb of right hand 07/04/2014    Priority: Low  . Encounter for therapeutic drug monitoring 07/02/2014    Priority: Low  . Allergic rhinitis 02/14/2014    Priority: Low  . Fecal incontinence 10/18/2013    Priority: Low  . Recurrent right inguinal hernia 11/22/2012    Priority: Low  . Intention tremor 11/01/2012    Priority: Low  . MIXED HEARING LOSS BILATERAL 01/10/2008    Priority: Low  . GERD 03/16/2007    Priority: Low  . Incomplete passage of stool 04/06/2018    Medications- reviewed and updated Current Outpatient Medications  Medication Sig Dispense Refill  . acetaminophen (TYLENOL) 500 MG tablet Take 1,000 mg by mouth every 6 (six) hours as needed for moderate pain or headache.    Marland Kitchen apixaban (ELIQUIS) 5 MG TABS tablet Take 1 tablet (5 mg total) by mouth 2 (two) times daily. 180 tablet 2  . dutasteride (AVODART) 0.5 MG capsule Take 0.5 mg by mouth daily.     . fluticasone (FLONASE) 50 MCG/ACT nasal spray Place 2 sprays into both nostrils daily as needed for allergies or rhinitis. 16 g 3  . furosemide (LASIX) 80 MG tablet Take 80mg  in the AM and 40mg  in the PM 135 tablet 3  .  hydroxypropyl methylcellulose / hypromellose (ISOPTO TEARS / GONIOVISC) 2.5 % ophthalmic solution Place 1 drop into both eyes 3 (three) times daily as needed for dry eyes.     Marland Kitchen losartan (COZAAR) 25 MG tablet Take 0.5 tablets (12.5 mg total) by mouth daily. 45 tablet 3  . metoprolol succinate (TOPROL-XL) 25 MG 24 hr tablet Take 1 tablet (25 mg total) by mouth daily. 90 tablet 2  . rosuvastatin (CRESTOR) 10 MG tablet Take 1 tablet (10 mg total) by mouth at bedtime. 90 tablet 3  . spironolactone (ALDACTONE) 25 MG tablet Take 0.5 tablets (12.5 mg total) by mouth daily. 90 tablet 3  . tamsulosin (FLOMAX) 0.4 MG CAPS capsule Take 0.4 mg by mouth 2 (two) times daily.     Marland Kitchen SHINGRIX injection     .  triamcinolone cream (KENALOG) 0.1 % Apply 1 application topically 2 (two) times daily. For 7-10 days maximum 28.4 g 0   No current facility-administered medications for this visit.      Objective:  BP 90/60   Pulse 69   Temp 97.9 F (36.6 C)   Ht 5\' 5"  (1.651 m)   Wt 152 lb 9.6 oz (69.2 kg)   SpO2 99%   BMI 25.39 kg/m  Gen: NAD, resting comfortably CV: RRR no murmurs rubs or gallops Lungs: CTAB no crackles, wheeze, rhonchi Abdomen: soft/nontender/nondistended/normal bowel sounds. No rebound or guarding.  Ext: no edema Skin: warm, dry MSK: Patient with spasm and muscle just below shoulder blade level and right thoracic back.  Signs of impingement with positive empty can, Neer, Hawkins sign.    Assessment and Plan    #Right thoracic back pain #Right shoulder plain S: when woke up to go to bathroom noted thoracic back pain on right side. Intense pain. Was able to get back to sleep for 2 hours- sat on shower chair for 20 minutes and seemed to ease off as well- tried heat this afternoon and took tylenol at 1 pm. If he reaches out does get a grabbing type pain. No fall or injury.   Has started back water aerobics and he worked hard in session yesterday- possibly triggered issue with that.   Mild right shoulder pain for a few weeks with forward flexion- at its worst 4-5/10 but has improved to 2-3. Good range of motion in arm.  A/P: For right thoracic back pain- suspect muscular strain.  Discussed conservative management as per AVS.  No fall or injury though may have strained with water aerobics.  No clear indication for early imaging-discussed could consider at 4 to 6 weeks if not improving  Right shoulder pain-possible bursitis versus rotator cuff issues versus arthritis-pain is very mild at this point.  Offered prednisone but patient wants to monitor to see if continues to improve  #Skin lesion S:thought he had a bug bite on right upper leg 3-4 months ago. Has never healed-  sometimes bigger, sometimes smaller. Itches most of the time and can be tender. Does have dermatologist but has not seen recently.   A/P:  unclear what lesion is but at this point may be chronically irritated by itch scratch cycle- will trial triamcinolone to break him out of itch/scratch cycle and if fails to improve he can follow up with dermatology who he is already established with.   Recommended follow up: As needed for these acute issues Future Appointments  Date Time Provider [place]  10/28/2018 11:45 AM Evans Lance, MD [facility]  11/08/2018  7:50 AM  [facility] DEVICE REMOTES [facility]  11/18/2018 10:20 AM Larey Dresser, MD [facility] None  08/18/2019 10:30 AM Narda Amber K, DO [facility] None   Lab/Order associations:   ICD-10-CM   1. Acute right-sided thoracic back pain  M54.6   2. Acute pain of right shoulder  M25.511   3. Skin lesion  L98.9    Meds ordered this encounter  Medications  . triamcinolone cream (KENALOG) 0.1 %    Sig: Apply 1 application topically 2 (two) times daily. For 7-10 days maximum    Dispense:  28.4 g    Refill:  0    Return precautions advised.  Garret Reddish, MD

## 2018-10-14 ENCOUNTER — Telehealth (HOSPITAL_COMMUNITY): Payer: Self-pay | Admitting: *Deleted

## 2018-10-14 NOTE — Telephone Encounter (Signed)
Please discuss with HF pharmacist Bonnita Nasuti) any alternatives for them (would Xarelto be cheaper?).  If not, would have to switch to warfarin.

## 2018-10-14 NOTE — Telephone Encounter (Signed)
Pt called stating him and his wife are on eliquis and it costs too much.  He said they did not qualify for patient assistance and wanted to know if at least one them could switch to a cheaper medication.  Routed to  [place] for advice

## 2018-10-17 NOTE — Telephone Encounter (Signed)
Lattie Haw can you please help with this.

## 2018-10-18 DIAGNOSIS — H349 Unspecified retinal vascular occlusion: Secondary | ICD-10-CM | POA: Diagnosis not present

## 2018-10-18 DIAGNOSIS — H52203 Unspecified astigmatism, bilateral: Secondary | ICD-10-CM | POA: Diagnosis not present

## 2018-10-18 DIAGNOSIS — Z961 Presence of intraocular lens: Secondary | ICD-10-CM | POA: Diagnosis not present

## 2018-10-18 DIAGNOSIS — H353131 Nonexudative age-related macular degeneration, bilateral, early dry stage: Secondary | ICD-10-CM | POA: Diagnosis not present

## 2018-10-18 NOTE — Telephone Encounter (Signed)
Spoke to pt wife copay used to be $25 but now $100/mo because in donut hole Called [employer]  - rivaroxaban would be about same copay  Options are to provide samples to help off set cost Or switch to warfarin with INR checks at [facility]

## 2018-10-24 ENCOUNTER — Other Ambulatory Visit (HOSPITAL_COMMUNITY): Payer: Self-pay | Admitting: *Deleted

## 2018-10-24 MED ORDER — LOSARTAN POTASSIUM 25 MG PO TABS: 12.5000 mg | ORAL_TABLET | Freq: Every day | ORAL | 3 refills | Status: DC

## 2018-10-28 ENCOUNTER — Ambulatory Visit (INDEPENDENT_AMBULATORY_CARE_PROVIDER_SITE_OTHER): Payer: Medicare Other | Admitting: Internal Medicine

## 2018-10-28 ENCOUNTER — Other Ambulatory Visit: Payer: Self-pay

## 2018-10-28 ENCOUNTER — Encounter: Payer: Self-pay | Admitting: Internal Medicine

## 2018-10-28 VITALS — BP 92/56 | HR 64 | Ht 65.0 in | Wt 150.0 lb

## 2018-10-28 DIAGNOSIS — I6529 Occlusion and stenosis of unspecified carotid artery: Secondary | ICD-10-CM

## 2018-10-28 DIAGNOSIS — I495 Sick sinus syndrome: Secondary | ICD-10-CM | POA: Diagnosis not present

## 2018-10-28 DIAGNOSIS — Z950 Presence of cardiac pacemaker: Secondary | ICD-10-CM

## 2018-10-28 DIAGNOSIS — I4891 Unspecified atrial fibrillation: Secondary | ICD-10-CM | POA: Diagnosis not present

## 2018-10-28 LAB — CUP PACEART INCLINIC DEVICE CHECK
Battery Remaining Longevity: 54 mo
Battery Voltage: 2.89 V
Brady Statistic RA Percent Paced: 0 %
Brady Statistic RV Percent Paced: 93 %
Date Time Interrogation Session: 20200821130530
Date Time Interrogation Session: 20200821132105
Implantable Lead Implant Date: 20140422
Implantable Lead Implant Date: 20140424
Implantable Lead Implant Date: 20140424
Implantable Lead Implant Date: 20140422
Implantable Lead Implant Date: 20140424
Implantable Lead Implant Date: 20140424
Implantable Lead Location: 753858
Implantable Lead Location: 753859
Implantable Lead Location: 753860
Implantable Lead Location: 753858
Implantable Lead Location: 753859
Implantable Lead Location: 753860
Implantable Lead Model: 5076
Implantable Lead Model: 5076
Implantable Lead Model: 5076
Implantable Lead Model: 5076
Implantable Pulse Generator Implant Date: 20140422
Implantable Pulse Generator Implant Date: 20140422
Lead Channel Impedance Value: 1012.5 Ohm
Lead Channel Impedance Value: 437.5 Ohm
Lead Channel Pacing Threshold Amplitude: 1.25 V
Lead Channel Pacing Threshold Amplitude: 1.25 V
Lead Channel Pacing Threshold Amplitude: 1.25 V
Lead Channel Pacing Threshold Amplitude: 1.25 V
Lead Channel Pacing Threshold Pulse Width: 0.5 ms
Lead Channel Pacing Threshold Pulse Width: 0.5 ms
Lead Channel Pacing Threshold Pulse Width: 0.5 ms
Lead Channel Pacing Threshold Pulse Width: 0.5 ms
Lead Channel Sensing Intrinsic Amplitude: 0.7 mV
Lead Channel Sensing Intrinsic Amplitude: 9.4 mV
Lead Channel Setting Pacing Amplitude: 2.5 V
Lead Channel Setting Pacing Amplitude: 2.5 V
Lead Channel Setting Pacing Pulse Width: 0.5 ms
Lead Channel Setting Pacing Pulse Width: 0.5 ms
Lead Channel Setting Sensing Sensitivity: 4 mV
Pulse Gen Model: 3210
Pulse Gen Model: 3210
Pulse Gen Serial Number: 2926516
Pulse Gen Serial Number: 2926516

## 2018-10-28 NOTE — Progress Notes (Signed)
HPI [name] returns today for ongoing followup of his atrial fib, chronic systolic heart failure, s/p AV node ablation and biv PPM insertion. The patient has been stable in the interim. He notes that he is a little weaker and a little more sob. He has not had syncope. No chest pain. No edema. He is exercising 5 times a week. He has not fever. He notes that he is much improved since his AV node ablation.  Allergies  Allergen Reactions  . Antihistamines, Diphenhydramine-Type Other (See Comments)    Causes difficulty in ability to urinate.     Current Outpatient Medications  Medication Sig Dispense Refill  . acetaminophen (TYLENOL) 500 MG tablet Take 1,000 mg by mouth every 6 (six) hours as needed for moderate pain or headache.    Marland Kitchen apixaban (ELIQUIS) 5 MG TABS tablet Take 1 tablet (5 mg total) by mouth 2 (two) times daily. 180 tablet 2  . dutasteride (AVODART) 0.5 MG capsule Take 0.5 mg by mouth daily.     . fluticasone (FLONASE) 50 MCG/ACT nasal spray Place 2 sprays into both nostrils daily as needed for allergies or rhinitis. 16 g 3  . furosemide (LASIX) 80 MG tablet Take 80mg  in the AM and 40mg  in the PM 135 tablet 3  . hydroxypropyl methylcellulose / hypromellose (ISOPTO TEARS / GONIOVISC) 2.5 % ophthalmic solution Place 1 drop into both eyes 3 (three) times daily as needed for dry eyes.     Marland Kitchen losartan (COZAAR) 25 MG tablet Take 0.5 tablets (12.5 mg total) by mouth daily. 45 tablet 3  . metoprolol succinate (TOPROL-XL) 25 MG 24 hr tablet Take 1 tablet (25 mg total) by mouth daily. 90 tablet 2  . rosuvastatin (CRESTOR) 10 MG tablet Take 1 tablet (10 mg total) by mouth at bedtime. 90 tablet 3  . SHINGRIX injection     . spironolactone (ALDACTONE) 25 MG tablet Take 0.5 tablets (12.5 mg total) by mouth daily. 90 tablet 3  . tamsulosin (FLOMAX) 0.4 MG CAPS capsule Take 0.4 mg by mouth 2 (two) times daily.     Marland Kitchen triamcinolone cream (KENALOG) 0.1 % Apply 1 application topically 2  (two) times daily. For 7-10 days maximum 28.4 g 0   No current facility-administered medications for this visit.      Past Medical History:  Diagnosis Date  . Arthritis    "back" (03/20/2014)  . Atrial fibrillation ([place])   . BPH (benign prostatic hypertrophy)   . Breast mass    "on both sides" (06/28/2012)  . Cardiomyopathy primary-nonischemic EF 45%  . Carotid artery occlusion   . CHF (congestive heart failure) ([place])   . Coronary artery disease   . Diverticulosis of colon without hemorrhage 01/03/2014  . Dysphagia   . Elevated liver enzymes   . Esophageal stricture   . GERD (gastroesophageal reflux disease)   . Hearing loss, mixed, bilateral   . Hiatal hernia   . Hx of cardiovascular stress test    Adenosine Myoview (07/2013):  Apical cap, apical lateral and mid anterolateral scar, small amount of peri-infarct ischemia, EF 36%; Medium Risk  . Hyperlipidemia   . Increased prostate specific antigen (PSA) velocity   . Internal hemorrhoids 01/03/2014  . Lumbar back pain   . Pacemaker    st jude  . Pacemaker infection ([place])    06/28/12  . Polymyalgia rheumatica ([place])   . Sick sinus syndrome ([facility])     ROS:   All systems reviewed and  negative except as noted in the HPI.   Past Surgical History:  Procedure Laterality Date  . AV NODE ABLATION N/A 04/20/2016   Procedure: AV Node Ablation;  Surgeon: Evans Lance, MD;  Location: [place] [facility];  Service: Cardiovascular;  Laterality: N/A;  . CARDIAC CATHETERIZATION  1980's   [place]  . CARDIOVERSION  06/17/2011   Procedure: CARDIOVERSION;  Surgeon: Lelon Perla, MD;  Location: [place];  Service: Cardiovascular;  Laterality: N/A;  . CARDIOVERSION  09/09/2011   Procedure: CARDIOVERSION;  Surgeon: Carlena Bjornstad, MD;  Location: [place];  Service: Cardiovascular;  Laterality: N/A;  . CARDIOVERSION  01/01/2012   Procedure: CARDIOVERSION;  Surgeon: Hillary Bow, MD;  Location: [facility] ENDOSCOPY;  Service: Cardiovascular;   Laterality: N/A;  . CARDIOVERSION N/A 08/03/2014   Procedure: CARDIOVERSION;  Surgeon: Larey Dresser, MD;  Location: [facility] ENDOSCOPY;  Service: Cardiovascular;  Laterality: N/A;  . CARDIOVERSION N/A 11/07/2015   Procedure: CARDIOVERSION;  Surgeon: Larey Dresser, MD;  Location: [facility] ENDOSCOPY;  Service: Cardiovascular;  Laterality: N/A;  . CARDIOVERSION N/A 11/20/2015   Procedure: CARDIOVERSION;  Surgeon: Larey Dresser, MD;  Location: [place];  Service: Cardiovascular;  Laterality: N/A;  . CAROTID ENDARTERECTOMY    . CATARACT EXTRACTION, BILATERAL Bilateral 1980's  . COLONOSCOPY N/A 01/03/2014   Procedure: COLONOSCOPY;  Surgeon: Gatha Mayer, MD;  Location: [facility] ENDOSCOPY;  Service: Endoscopy;  Laterality: N/A;  . ENDARTERECTOMY Right 07/26/2017   Procedure: RIGHT CAROTID  ARTERY ENDARTERECTOMY;  Surgeon: Rosetta Posner, MD;  Location: [facility] OR;  Service: Vascular;  Laterality: Right;  . ESOPHAGOGASTRODUODENOSCOPY    . GENERATOR REMOVAL Left 06/15/2012   Procedure: GENERATOR REMOVAL;  Surgeon: Evans Lance, MD;  Location: [place];  Service: Cardiovascular;  Laterality: Left;  . HEMORRHOID BANDING    . INGUINAL HERNIA REPAIR Right 2012; 03/20/2014  . INGUINAL HERNIA REPAIR Right 03/20/2014   Procedure: OPEN REPAIR OF RIGHT INGUINAL WITH MESH;  Surgeon: Donnie Mesa, MD;  Location: [place];  Service: General;  Laterality: Right;  . INSERT / REPLACE / REMOVE PACEMAKER    . INSERTION OF MESH Right 03/20/2014   Procedure: INSERTION OF MESH;  Surgeon: Donnie Mesa, MD;  Location: [place];  Service: General;  Laterality: Right;  . KNEE ARTHROSCOPY WITH LATERAL MENISECTOMY Left 12/16/2016   Procedure: KNEE ARTHROSCOPY WITH LATERAL MENISECTOMY and Chondroplasty;  Surgeon: Earlie Server, MD;  Location: [place];  Service: Orthopedics;  Laterality: Left;  . LEAD REVISION N/A 06/29/2012   Procedure: LEAD REVISION;  Surgeon: Deboraha Sprang, MD;  Location: [facility] CATH LAB;  Service: Cardiovascular;  Laterality: N/A;  .  PACEMAKER GENERATOR CHANGE N/A 06/15/2012   Procedure: PACEMAKER GENERATOR CHANGE;  Surgeon: Evans Lance, MD;  Location: [place];  Service: Cardiovascular;  Laterality: N/A;  . PACEMAKER REVISION  06/30/2012   Procedure: PACEMAKER LEAD REVISION;  Surgeon: Evans Lance, MD;  Location: [facility] CATH LAB;  Service: Cardiovascular;;  . PARACENTESIS  ~ 0000000   complication from pacer change in 2014  . PATCH ANGIOPLASTY Right 07/26/2017   Procedure: WITH PATCH ANGIOPLASTY;  Surgeon: Rosetta Posner, MD;  Location: [place];  Service: Vascular;  Laterality: Right;  . PERMANENT PACEMAKER INSERTION N/A 06/28/2012   Procedure: PERMANENT PACEMAKER INSERTION;  Surgeon: Evans Lance, MD;  Location: [facility] CATH LAB;  Service: Cardiovascular;  Laterality: N/A;  . TEE WITHOUT CARDIOVERSION  01/01/2012   Procedure: TRANSESOPHAGEAL ECHOCARDIOGRAM (TEE);  Surgeon: Fay Records, MD;  Location: [facility] ENDOSCOPY;  Service: Cardiovascular;  Laterality: N/A;  . TEE WITHOUT CARDIOVERSION N/A 08/03/2014   Procedure: TRANSESOPHAGEAL ECHOCARDIOGRAM (TEE);  Surgeon: Larey Dresser, MD;  Location: [place];  Service: Cardiovascular;  Laterality: N/A;  . TEE WITHOUT CARDIOVERSION N/A 11/07/2015   Procedure: TRANSESOPHAGEAL ECHOCARDIOGRAM (TEE);  Surgeon: Larey Dresser, MD;  Location: [place];  Service: Cardiovascular;  Laterality: N/A;  . TONSILLECTOMY AND ADENOIDECTOMY  1938  . UMBILICAL HERNIA REPAIR  [phone] X 3     Family History  Problem Relation Age of Onset  . Heart attack Father 24       deceased  . AAA (abdominal aortic aneurysm) Mother        deceased AAA  . Hypertension Mother   . Other Brother        deceased at birth  . Healthy Son   . Healthy Daughter   . Diabetes Cousin      Social History   Socioeconomic History  . Marital status: Married    Spouse name: Not on file  . Number of children: 3  . Years of education: Not on file  . Highest education level: Not on file  Occupational History  .  Occupation: retired    Fish farm manager: RETIRED    Comment: Engineering geologist  Social Needs  . Financial resource strain: Not on file  . Food insecurity    Worry: Not on file    Inability: Not on file  . Transportation needs    Medical: Not on file    Non-medical: Not on file  Tobacco Use  . Smoking status: Former Smoker    Packs/day: 1.00    Years: 40.00    Pack years: 40.00    Types: Cigarettes    Quit date: 03/10/1983    Years since quitting: 35.6  . Smokeless tobacco: Never Used  Substance and Sexual Activity  . Alcohol use: No    Alcohol/week: 0.0 standard drinks    Comment: quit drinking 2000-? alcohol related cardiomyopathy  . Drug use: No  . Sexual activity: Not Currently  Lifestyle  . Physical activity    Days per week: Not on file    Minutes per session: Not on file  . Stress: Not on file  Relationships  . Social Herbalist on phone: Not on file    Gets together: Not on file    Attends religious service: Not on file    Active member of club or organization: Not on file    Attends meetings of clubs or organizations: Not on file    Relationship status: Not on file  . Intimate partner violence    Fear of current or ex partner: Not on file    Emotionally abused: Not on file    Physically abused: Not on file    Forced sexual activity: Not on file  Other Topics Concern  . Not on file  Social History Narrative   Married. 3 children (6 together). 5 grandkids with with 5 grandkids (2nd marriage). No greatgrandkids.    One story home.   Retired from Education officer, community: college   Hobbies: play golf, bridge, garden, write family stories     BP (!) 92/56   Pulse 64   Ht 5\' 5"  (1.651 m)   Wt 150 lb (68 kg)   SpO2 99%   BMI 24.96 kg/m   Physical Exam:  Well appearing NAD HEENT: Unremarkable Neck:  No JVD, no thyromegally Lymphatics:  No adenopathy Back:  No CVA tenderness Lungs:  Clear with no wheezes HEART:   Regular rate rhythm, no murmurs, no rubs, no clicks Abd:  soft, positive bowel sounds, no organomegally, no rebound, no guarding Ext:  2 plus pulses, no edema, no cyanosis, no clubbing Skin:  No rashes no nodules Neuro:  CN II through XII intact, motor grossly intact  DEVICE  Normal device function.  See PaceArt for details.   Assess/Plan: 1. Atrial fib- his VR is well controlled. No change in meds.  2. VT - he has about 7% PVC"s. 3. Chronic systolic heart failure - his symptoms are class 2. He is encouraged to continue his regular exercise. 4. PPM - his St. Jude biv PPM is working normally.  Mikle Bosworth.D.

## 2018-10-28 NOTE — Patient Instructions (Signed)
Medication Instructions:  Your physician recommends that you continue on your current medications as directed. Please refer to the Current Medication list given to you today.  Labwork: None ordered.  Testing/Procedures: None ordered.  Follow-Up: Your physician recommends that you schedule a follow-up appointment in:   12 months with Dr. Taylor   Any Other Special Instructions Will Be Listed Below (If Applicable).     If you need a refill on your cardiac medications before your next appointment, please call your pharmacy.  

## 2018-11-08 ENCOUNTER — Ambulatory Visit (INDEPENDENT_AMBULATORY_CARE_PROVIDER_SITE_OTHER): Payer: Medicare Other | Admitting: *Deleted

## 2018-11-08 DIAGNOSIS — I428 Other cardiomyopathies: Secondary | ICD-10-CM

## 2018-11-08 DIAGNOSIS — I5022 Chronic systolic (congestive) heart failure: Secondary | ICD-10-CM

## 2018-11-11 LAB — CUP PACEART REMOTE DEVICE CHECK
Date Time Interrogation Session: 20200904041042
Implantable Lead Implant Date: 20140422
Implantable Lead Implant Date: 20140424
Implantable Lead Implant Date: 20140424
Implantable Lead Location: 753858
Implantable Lead Location: 753859
Implantable Lead Location: 753860
Implantable Lead Model: 5076
Implantable Lead Model: 5076
Implantable Pulse Generator Implant Date: 20140422
Pulse Gen Model: 3210
Pulse Gen Serial Number: 2926516

## 2018-11-18 ENCOUNTER — Ambulatory Visit (HOSPITAL_COMMUNITY)
Admission: RE | Admit: 2018-11-18 | Discharge: 2018-11-18 | Disposition: A | Discharge status: Home / Self Care | Payer: Medicare Other | Source: Ambulatory Visit | Attending: Cardiology | Admitting: Cardiology

## 2018-11-18 ENCOUNTER — Encounter (HOSPITAL_COMMUNITY): Payer: Self-pay | Admitting: Cardiology

## 2018-11-18 ENCOUNTER — Other Ambulatory Visit: Payer: Self-pay

## 2018-11-18 VITALS — BP 90/63 | HR 72 | Wt 157.6 lb

## 2018-11-18 DIAGNOSIS — Z79899 Other long term (current) drug therapy: Secondary | ICD-10-CM | POA: Insufficient documentation

## 2018-11-18 DIAGNOSIS — E785 Hyperlipidemia, unspecified: Secondary | ICD-10-CM | POA: Insufficient documentation

## 2018-11-18 DIAGNOSIS — R0602 Shortness of breath: Secondary | ICD-10-CM

## 2018-11-18 DIAGNOSIS — I442 Atrioventricular block, complete: Secondary | ICD-10-CM | POA: Insufficient documentation

## 2018-11-18 DIAGNOSIS — K219 Gastro-esophageal reflux disease without esophagitis: Secondary | ICD-10-CM | POA: Insufficient documentation

## 2018-11-18 DIAGNOSIS — G629 Polyneuropathy, unspecified: Secondary | ICD-10-CM | POA: Diagnosis not present

## 2018-11-18 DIAGNOSIS — I700 Atherosclerosis of aorta: Secondary | ICD-10-CM | POA: Insufficient documentation

## 2018-11-18 DIAGNOSIS — Z8249 Family history of ischemic heart disease and other diseases of the circulatory system: Secondary | ICD-10-CM | POA: Diagnosis not present

## 2018-11-18 DIAGNOSIS — N400 Enlarged prostate without lower urinary tract symptoms: Secondary | ICD-10-CM | POA: Diagnosis not present

## 2018-11-18 DIAGNOSIS — I6521 Occlusion and stenosis of right carotid artery: Secondary | ICD-10-CM | POA: Diagnosis not present

## 2018-11-18 DIAGNOSIS — Z7901 Long term (current) use of anticoagulants: Secondary | ICD-10-CM | POA: Diagnosis not present

## 2018-11-18 DIAGNOSIS — I4891 Unspecified atrial fibrillation: Secondary | ICD-10-CM | POA: Diagnosis not present

## 2018-11-18 DIAGNOSIS — I5022 Chronic systolic (congestive) heart failure: Secondary | ICD-10-CM

## 2018-11-18 DIAGNOSIS — Z87891 Personal history of nicotine dependence: Secondary | ICD-10-CM | POA: Insufficient documentation

## 2018-11-18 DIAGNOSIS — I480 Paroxysmal atrial fibrillation: Secondary | ICD-10-CM | POA: Insufficient documentation

## 2018-11-18 DIAGNOSIS — I2510 Atherosclerotic heart disease of native coronary artery without angina pectoris: Secondary | ICD-10-CM | POA: Diagnosis not present

## 2018-11-18 DIAGNOSIS — I428 Other cardiomyopathies: Secondary | ICD-10-CM

## 2018-11-18 DIAGNOSIS — M353 Polymyalgia rheumatica: Secondary | ICD-10-CM | POA: Insufficient documentation

## 2018-11-18 LAB — BASIC METABOLIC PANEL
Anion gap: 9 (ref 5–15)
BUN: 22 mg/dL (ref 8–23)
CO2: 26 mmol/L (ref 22–32)
Calcium: 9.4 mg/dL (ref 8.9–10.3)
Chloride: 103 mmol/L (ref 98–111)
Creatinine, Ser: 1.14 mg/dL (ref 0.61–1.24)
GFR calc Af Amer: >60 mL/min (ref >60)
GFR calc non Af Amer: 58 mL/min — ABNORMAL LOW (ref >60)
Glucose, Bld: 83 mg/dL (ref 70–99)
Potassium: 4.3 mmol/L (ref 3.5–5.1)
Sodium: 138 mmol/L (ref 135–145)

## 2018-11-18 MED ORDER — SPIRONOLACTONE 25 MG PO TABS: 25.0000 mg | ORAL_TABLET | Freq: Every day | ORAL | 3 refills | Status: DC

## 2018-11-18 NOTE — Patient Instructions (Addendum)
INCREASE Lasix to 80mg  twice a day for 2 DAYS ONLY  THEN Resume Lasix 80mg  in the morning and 40mg  in the evening  INCREASE Spironolactone to 25mg  (1 tab) daily  Labs today and repeat in 10 days We will only contact you if something comes back abnormal or we need to make some changes. Otherwise no news is good news!  You were referred to Cardiac Rehab here at [facility].  They will call you to schedule this appointment.   Your physician has recommended that you have a cardiopulmonary stress test (CPX). CPX testing is a non-invasive measurement of heart and lung function. It replaces a traditional treadmill stress test. This type of test provides a tremendous amount of information that relates not only to your present condition but also for future outcomes. This test combines measurements of you ventilation, respiratory gas exchange in the lungs, electrocardiogram (EKG), blood pressure and physical response before, during, and following an exercise protocol.  You have been ordered a PYP Scan.  This is done in the Radiology Department of [facility].  When you come for this test please plan to be there 2-3 hours.  You were ordered for  Chest xray today.  You will have to go to Radiology to have this done. No appointment needed.  Your physician recommends that you schedule a follow-up appointment in: 2 months with Dr Aundra Dubin   At the [facility], you and your health needs are our priority. As part of our continuing mission to provide you with exceptional heart care, we have created designated Provider Care Teams. These Care Teams include your primary Cardiologist (physician) and Advanced Practice Providers (APPs- Physician Assistants and Nurse Practitioners) who all work together to provide you with the care you need, when you need it.   You may see any of the following providers on your designated Care Team at your next follow up: Marland Kitchen Dr Glori Bickers . Dr [name] . Darrick Grinder, NP   Please be sure to bring in all your medications bottles to every appointment.

## 2018-11-19 NOTE — Progress Notes (Signed)
Advanced Heart Failure Clinic Note  Patient ID: [name], male   DOB: [birth year], 85 y.o.   MRN: [id number] PCP: Dr. Yong Channel Cardiology: Dr. Gerarda Fraction [name] is a 85 y.o. male with history of paroxysmal atrial fibrillation, nonischemic cardiomyopathy, and complete heart block with St Jude CRT-P system presents for cardiology followup.  He was admitted in 4/14 with pacemaker pocket infection.  His pacemaker was removed and temporary-permanent device was placed.  Later, he had the temporary-permanent device removed and a new CRT-P device was placed. He developed dyspnea post-operatively and was found to have a large right-sided pleural effusion.  He was admitted and got a chest tube for drainage. He had followup with Dr. Servando Snare and it was decided that he would not need VATS.  He had been on amiodarone for maintenance of NSR.  This had been more successful than Tikosyn.   Around 3/15, he started developing increasing exertional dyspnea.  He was short of breath walking up a hill or incline.  He could still walk on the treadmill for exercise for 10-15 minutes and use the elliptical without much trouble on most days.  No orthopnea or PND.  No chest pain.  He saw Dr Leanne Chang and was started on Lasix three times a week due to concern for volume overload.  This did not seem to have helped much.  After that, he had PFTs done which showed normal spirometry but DLCO 50% predicted.  Therefore, amiodarone was stopped due to concern for lung toxicity.  He saw Richardson Dopp for a pre-operative evaluation prior to right inguinal hernia repair.  Given the exertional dyspnea, he was set up for adenosine Cardiolite in 5/15.  This showed EF 36% with a small area of primarily scar in the apex, apical lateral wall, and mid anterolateral wall.  He has not felt palpitations. Echo in 5/15 showed EF 40-45%, similar to prior study.   I had him see pulmonary for evaluation for amiodarone lung toxicity.  CT chest  looked ok, and he was not thought to have amiodarone lung toxicity.   At a prior appointment in 2016, [name] was noted to be back in atrial fibrillation.  He was more short of breath with exertion and fatigued.  He has historically tolerated atrial fibrillation poorly.  I did a TEE-guided DCCV in 5/16 back to NSR.  TEE showed EF 35-40%.  He did not immediately feel better like he has in the past post-DCCV, so Lasix was increased.    Recurrent symptomatic atrial fibrillation in 8/17 with TEE-guided DCCV to NSR.  TEE showed EF 25-30%.  Recurrent symptomatic atrial fibrillation with CHF exacerbation in 9/17.  He had repeat DCCV and Lasix was increased.   He was back in atrial fibrillation in 11/17 and appears to have been out of rhythm since then.  He was more short of breath in atrial fibrillation with a significant fall in BiV pacing percentage.  Lasix was significantly increased to try to cope with volume overload.  In 2/18, he had AV nodal ablation to promote BiV pacing.    Admitted 5/19 with right eye lower visual field loss, found to have severe right carotid stenosis. S/p R CEA on 07/26/17. Switched from coumadin to Eliquis. Volume was stable.  Echo in 5/19 showed EF 30-35%, mild AI.   Echo in 6/20 showed EF 30-35% with mildly decreased RV systolic function.   Today, he presents for followup of CHF. For the last few months, he has been  more short of breath with exertion.  He is short of breath walking up a hill.  He gets short of breath when he initially starts walking, then it improves.  He avoids stairs.  No orthopnea/PND.  BP is on the low side but he denies lightheadedness.  Weight has been stable. At last visit, he had similar symptoms and REDS clip reading was normal.  He has peripheral neuropathy with tingling/pain in feet and hands.  His ankles are mildly swollen today, he says that he missed a dose of Lasix yesterday.   Labs (5/19): LDL 72, HDL 41, K 4.2, creatinine 0.86 Labs  (10/19): K 3.9, creatinine 0.96 Labs (12/19): K 4.4, creatinine 0.99, LDL 63, HDl 41 Labs (3/20): K 4.4, creatinine 1.03 Labs (6/20): TSH normal, K 3.8, creatinine 0.97  St Jude device interrogation:  94% BiV pacing, no VT. He does not have Corevue.   PMH: 1. Low back pain 2. Hyperlipidemia 3. GERD 4. BPH 5. Polymyalgia rheumatica 6. Atrial fibrillation: Failed Tikosyn in the past.  Paroxysmal, on amiodarone and warfarin.  DCCV in 4/13, 7/13, 10/13.  Has held NSR with amiodarone.  However, concern for amiodarone lung toxicity: PFTs (4/15) with FVC 90% predicted, FEV1 90%, ratio 97%, DLCO 50%.   Recurrence of atrial fibrillation in 5/16, TEE-guided DCCV back to NSR in 5/16.  - Recurrence of atrial fibrillation in 8/17. TEE-guided DCCV 11/07/15.  - Recurrence of atrial fibrillation 9/17.  DCCV 11/20/15 but back in atrial fibrillation by 11/26/15.  - Amiodarone stopped due to persistent atrial fibrillation.  - AV nodal ablation in 2/18 to promote BiV pacing.  7. Transaminitis: Mild, attributed to amiodarone.  8. Complete heart block s/p St Jude CRT-P device.  Patient developed PCM pocket infection in 4/14 and had his first system removed with placement of a temporary permanent PCM.  He later had CRT-P device replaced.  This was complicated by large right-sided hemorrhagic pleural effusion requiring chest tube.   9. Nonischemic cardiomyopathy: Echo (9/13) with EF 40-45%, diffuse HK, mild Craig.  Mild CAD only on prior LHC.  Echo (4/14) with EF 45-50%.  Adenosine Cardiolite (5/15) with EF 36% (visually appeared higher per report), small area of scar with peri-infarct ischemia in the apex, apical lateral wall, and mid anterolateral wall. Echo (5/15) with EF 40-45%, basal to mid inferolateral severe hypokinesis, basal to mid anterolateral hypokinesis, mildly dilated RV with mildly decreased RV systolic function, mild to moderate Craig.  TEE (5/16) with EF 35-40%, inferior and inferolateral severe hypokinesis,  normal RV size with mildly decreased systolic function, moderate Craig, peak RV-RA gradient 26 mmHg.  - TEE (8/17): EF 25-30%, mildly dilated RV with mildly decreased systolic function, moderate biatrial enlargement, moderate central Craig.  - AV nodal ablation 2/18.  - Echo (5/19): EF 30-35%, mild AI.  - Echo (6/20): EF 30-35%, mild LV dilation, mild RV enlargement with mildly decreased systolic function, mild Craig.  10. Large right pleural effusion requiring chest tube following removal and reimplantation of PCM. Most recent chest CT in 6/15 showed significant decrease in size of loculated pleural effusion since 5/14.   11. Right CEA 5/19.  12. Right central retinal artery occlusion in 5/19.   SH: Married, prior smoker (quit 1985), no ETOH x years, retired, lives at [facility]  [place]: Father with MI at 23, mother with AAA.   Review of systems complete and found to be negative unless listed in HPI.   Current Outpatient Medications  Medication Sig Dispense Refill  acetaminophen (TYLENOL) 500 MG tablet Take 1,000 mg by mouth every 6 (six) hours as needed for moderate pain or headache.     apixaban (ELIQUIS) 5 MG TABS tablet Take 1 tablet (5 mg total) by mouth 2 (two) times daily. 180 tablet 2   dutasteride (AVODART) 0.5 MG capsule Take 0.5 mg by mouth daily.      fluticasone (FLONASE) 50 MCG/ACT nasal spray Place 2 sprays into both nostrils daily as needed for allergies or rhinitis. 16 g 3   furosemide (LASIX) 80 MG tablet Take 80mg  in the AM and 40mg  in the PM 135 tablet 3   hydroxypropyl methylcellulose / hypromellose (ISOPTO TEARS / GONIOVISC) 2.5 % ophthalmic solution Place 1 drop into both eyes 3 (three) times daily as needed for dry eyes.      losartan (COZAAR) 25 MG tablet Take 0.5 tablets (12.5 mg total) by mouth daily. 45 tablet 3   metoprolol succinate (TOPROL-XL) 25 MG 24 hr tablet Take 1 tablet (25 mg total) by mouth daily. 90 tablet 2   rosuvastatin (CRESTOR) 10 MG tablet Take 1  tablet (10 mg total) by mouth at bedtime. 90 tablet 3   spironolactone (ALDACTONE) 25 MG tablet Take 1 tablet (25 mg total) by mouth daily. 90 tablet 3   tamsulosin (FLOMAX) 0.4 MG CAPS capsule Take 0.4 mg by mouth 2 (two) times daily.      triamcinolone cream (KENALOG) 0.1 % Apply 1 application topically 2 (two) times daily. For 7-10 days maximum 28.4 g 0   No current facility-administered medications for this encounter.     BP 90/63    Pulse 72    Wt 71.5 kg (157 lb 9.6 oz)    SpO2 98%    BMI 26.23 kg/m   Wt Readings from Last 3 Encounters:  11/18/18 71.5 kg (157 lb 9.6 oz)  10/28/18 68 kg (150 lb)  09/29/18 69.2 kg (152 lb 9.6 oz)   General: NAD Neck: JVP 7-8 cm, no thyromegaly or thyroid nodule.  Lungs:  Slight crackles at bases.  CV: Nondisplaced PMI.  Heart regular S1/S2, no S3/S4, no murmur.  1+ ankle edema.  No carotid bruit.  Normal pedal pulses.  Abdomen: Soft, nontender, no hepatosplenomegaly, no distention.  Skin: Intact without lesions or rashes.  Neurologic: Alert and oriented x 3.  Psych: Normal affect. Extremities: No clubbing or cyanosis.  HEENT: Normal.   Assessment/Plan: 1. Chronic systolic CHF:  Presumed nonischemic cardiomyopathy (prior cath with only mild CAD).  He had an adenosine Cardiolite in 5/15 with EF 36% and possible small area of apical and lateral scar with peri-infarct ischemia. He has St Jude CRT-P.  Echo in 6/20 showed EF 30-35%. He has been more symptomatic over the last few month, NYHA class II-III.  He is minimally volume overloaded on exam with stable weight, I do not think that active CHF explains his worsened dyspnea.  - Increase Lasix to 80 mg bid x 2 doses (given missed Lasix dose yesterday), then decrease back to 80 qam/40 qpm.  BMET today.  - Continue Toprol XL 25 mg daily.  - Increase spironolactone to 25 mg daily.  BMET 10 days.  - Continue losartan 12.5 mg daily.   - Given dyspnea of uncertain etiology, I am going to have him get a  CXR (PA/lateral).  - I will arrange for CPX, ?if there is a pulmonary issue causing his worsening symptoms.  - With CHF and peripheral neuropathy, I will arrange for PYP scan as  well as myeloma panel and urine immunofixation to assess for evidence for systemic amyloidosis.  2. Atrial fibrillation: Now persistent.  Had breakthrough with Tikosyn and now not able to hold NSR on amiodarone. He is now off amiodarone.  He is status post AV nodal ablation with significant improvement in symptoms and BiV pacing percentage (94% today, suspect mildly low due to PVCs).  - Continue apixiban 5 mg BID.  3. Hyperlipidemia: History of nonobstructive CAD and recent CEA.    - Continue Crestor, good lipids in 12/19.  4. Rt CEA occlusion s/p R CEA 07/26/17: He had an ulcerated, 70% stenotic plaque in the right internal carotid. Suspect this is the source of embolism for CRAO.     - Continue statin.  - Followed at [facility].   Followup in 2 months  [name], MD 11/19/2018

## 2018-11-21 ENCOUNTER — Encounter (HOSPITAL_COMMUNITY): Payer: Self-pay | Admitting: *Deleted

## 2018-11-21 ENCOUNTER — Telehealth (HOSPITAL_COMMUNITY): Payer: Self-pay

## 2018-11-21 LAB — MULTIPLE MYELOMA PANEL, SERUM
Albumin SerPl Elph-Mcnc: 4.1 g/dL (ref 2.9–4.4)
Albumin/Glob SerPl: 1.7 (ref 0.7–1.7)
Alpha 1: 0.2 g/dL (ref 0.0–0.4)
Alpha2 Glob SerPl Elph-Mcnc: 0.7 g/dL (ref 0.4–1.0)
B-Globulin SerPl Elph-Mcnc: 0.8 g/dL (ref 0.7–1.3)
Gamma Glob SerPl Elph-Mcnc: 0.9 g/dL (ref 0.4–1.8)
Globulin, Total: 2.5 g/dL (ref 2.2–3.9)
IgA: 275 mg/dL (ref 61–437)
IgG (Immunoglobin G), Serum: 1039 mg/dL (ref 603–1613)
IgM (Immunoglobulin M), Srm: 83 mg/dL (ref 15–143)
Total Protein ELP: 6.6 g/dL (ref 6.0–8.5)

## 2018-11-21 LAB — IMMUNOFIXATION, URINE

## 2018-11-21 NOTE — Telephone Encounter (Signed)
Pt insurance is active and benefits verified through Medicare a/b Co-pay 0, DED $198/$198 met, out of pocket 0/0 met, co-insurance 20%. no pre-authorization required. Passport, 11/21/2018'@4' :04pm, REF# [id number]  Will contact patient to see if he is interested in the Cardiac Rehab Program. If interested, patient will need to complete follow up appt. Once completed, patient will be contacted for scheduling upon review by the RN Navigator.  2ndary insurance is active and benefits verified through [employer]. Co-pay 0, DED 0/0 met, out of pocket 0/0 met, co-insurance 0. No pre-authorization required

## 2018-11-21 NOTE — Progress Notes (Signed)
eceived office referral for this pt to participate in cardiac rehab with the diagnosis of Chronic Systolic Heart Failure. By Dr. Aundra Dubin. Clinical review of pt follow up appt on 11/18/18  with Dr. Aundra Dubin in the [place] clinic- cardiologist office note. Reviewed pt most recent echo which showed on 6/5/EF 30-35%.  Pt is making the expected progress in recovery.  Pt appropriate for scheduling for on site cardiac rehab and/or enrollment in Virtual Cardiac Rehab.  Pt Covid score is 6.  Will forward to staff for follow up insurance benefits and scheudling. Cherre Huger, BSN Cardiac and Training and development officer

## 2018-11-23 ENCOUNTER — Telehealth (HOSPITAL_COMMUNITY): Payer: Self-pay

## 2018-11-23 NOTE — Progress Notes (Signed)
Remote pacemaker transmission.   

## 2018-11-23 NOTE — Telephone Encounter (Signed)
Cardiac Rehab Medication Review by a Pharmacist  Does the patient  feel that his/her medications are working for him/her?  yes  Has the patient been experiencing any side effects to the medications prescribed?  no  Does the patient measure his/her own blood pressure or blood glucose at home?  No. He could take his blood pressure but does not currently.   Does the patient have any problems obtaining medications due to transportation or finances?   No. Reports the copay of apixaban is high but he pays it.   Understanding of regimen: excellent Understanding of indications: good Potential of compliance: good   Pharmacist comments: N/A  [name] 11/23/2018 2:49 PM

## 2018-11-24 ENCOUNTER — Ambulatory Visit: Payer: Medicare Other | Admitting: Nurse Practitioner

## 2018-11-28 ENCOUNTER — Telehealth (HOSPITAL_COMMUNITY): Payer: Self-pay | Admitting: *Deleted

## 2018-11-28 ENCOUNTER — Other Ambulatory Visit: Payer: Self-pay

## 2018-11-28 ENCOUNTER — Ambulatory Visit (HOSPITAL_COMMUNITY)
Admission: RE | Admit: 2018-11-28 | Discharge: 2018-11-28 | Disposition: A | Discharge status: Home / Self Care | Payer: Medicare Other | Source: Ambulatory Visit | Attending: Internal Medicine | Admitting: Internal Medicine

## 2018-11-28 DIAGNOSIS — I428 Other cardiomyopathies: Secondary | ICD-10-CM | POA: Insufficient documentation

## 2018-11-28 LAB — BASIC METABOLIC PANEL
Anion gap: 10 (ref 5–15)
BUN: 21 mg/dL (ref 8–23)
CO2: 27 mmol/L (ref 22–32)
Calcium: 9.2 mg/dL (ref 8.9–10.3)
Chloride: 102 mmol/L (ref 98–111)
Creatinine, Ser: 0.93 mg/dL (ref 0.61–1.24)
GFR calc Af Amer: >60 mL/min (ref >60)
GFR calc non Af Amer: >60 mL/min (ref >60)
Glucose, Bld: 91 mg/dL (ref 70–99)
Potassium: 3.7 mmol/L (ref 3.5–5.1)
Sodium: 139 mmol/L (ref 135–145)

## 2018-11-29 ENCOUNTER — Encounter (HOSPITAL_COMMUNITY): Payer: Self-pay

## 2018-11-29 ENCOUNTER — Encounter (HOSPITAL_COMMUNITY)
Admission: RE | Admit: 2018-11-29 | Discharge: 2018-11-29 | Disposition: A | Discharge status: Home / Self Care | Payer: Medicare Other | Source: Ambulatory Visit | Attending: Cardiology | Admitting: Cardiology

## 2018-11-29 ENCOUNTER — Telehealth (HOSPITAL_COMMUNITY): Payer: Self-pay

## 2018-11-29 ENCOUNTER — Telehealth (HOSPITAL_COMMUNITY): Payer: Self-pay | Admitting: *Deleted

## 2018-11-29 VITALS — Ht 65.75 in | Wt 154.5 lb

## 2018-11-29 DIAGNOSIS — I5022 Chronic systolic (congestive) heart failure: Secondary | ICD-10-CM

## 2018-11-29 NOTE — Telephone Encounter (Signed)
Pt aware of results of urine test for monoclonal gammpathy and chest xray.  Pt appreciative. Pt aware of lab work from yesterday as well

## 2018-11-29 NOTE — Telephone Encounter (Signed)
Spoke with the patient confirmed orientation appointment and completed health history.Barnet Pall, RN,BSN 11/29/2018 9:01 AM

## 2018-11-29 NOTE — Telephone Encounter (Signed)
-----   Message from Larey Dresser, MD sent at 11/20/2018  9:49 PM EDT ----- Chronic bibasilar scarring, nothing acute.

## 2018-11-29 NOTE — Telephone Encounter (Signed)
-----   Message from Larey Dresser, MD sent at 11/21/2018 10:31 PM EDT ----- Negative, no evidence for monoclonal gammopathy.

## 2018-11-29 NOTE — Progress Notes (Signed)
Cardiac Individual Treatment Plan  Patient Details  Name: [name] MRN: [id number] Date of Birth: 1929-03-27 Referring Provider:     CARDIAC REHAB PHASE II ORIENTATION from 11/29/2018 in [facility]  Referring Provider  Dr. Aundra Dubin       Initial Encounter Date:    CARDIAC REHAB PHASE II ORIENTATION from 11/29/2018 in [facility]  Date  11/29/18      Visit Diagnosis: Heart failure, chronic systolic ([place])  Patient's Home Medications on Admission:  Current Outpatient Medications:  .  spironolactone (ALDACTONE) 25 MG tablet, Take 1 tablet (25 mg total) by mouth daily., Disp: 90 tablet, Rfl: 3 .  acetaminophen (TYLENOL) 500 MG tablet, Take 1,000 mg by mouth every 6 (six) hours as needed for moderate pain or headache., Disp: , Rfl:  .  apixaban (ELIQUIS) 5 MG TABS tablet, Take 1 tablet (5 mg total) by mouth 2 (two) times daily., Disp: 180 tablet, Rfl: 2 .  dutasteride (AVODART) 0.5 MG capsule, Take 0.5 mg by mouth daily. , Disp: , Rfl:  .  fluticasone (FLONASE) 50 MCG/ACT nasal spray, Place 2 sprays into both nostrils daily as needed for allergies or rhinitis., Disp: 16 g, Rfl: 3 .  furosemide (LASIX) 80 MG tablet, Take 80mg  in the AM and 40mg  in the PM, Disp: 135 tablet, Rfl: 3 .  hydroxypropyl methylcellulose / hypromellose (ISOPTO TEARS / GONIOVISC) 2.5 % ophthalmic solution, Place 1 drop into both eyes 3 (three) times daily as needed for dry eyes. , Disp: , Rfl:  .  losartan (COZAAR) 25 MG tablet, Take 0.5 tablets (12.5 mg total) by mouth daily., Disp: 45 tablet, Rfl: 3 .  metoprolol succinate (TOPROL-XL) 25 MG 24 hr tablet, Take 1 tablet (25 mg total) by mouth daily., Disp: 90 tablet, Rfl: 2 .  rosuvastatin (CRESTOR) 10 MG tablet, Take 1 tablet (10 mg total) by mouth at bedtime., Disp: 90 tablet, Rfl: 3 .  tamsulosin (FLOMAX) 0.4 MG CAPS capsule, Take 0.4 mg by mouth 2 (two) times daily. , Disp: , Rfl:  .   triamcinolone cream (KENALOG) 0.1 %, Apply 1 application topically 2 (two) times daily. For 7-10 days maximum (Patient not taking: Reported on 11/23/2018), Disp: 28.4 g, Rfl: 0  Past Medical History: Past Medical History:  Diagnosis Date  . Arthritis    "back" (03/20/2014)  . Atrial fibrillation ([place])   . BPH (benign prostatic hypertrophy)   . Breast mass    "on both sides" (06/28/2012)  . Cardiomyopathy primary-nonischemic EF 45%  . Carotid artery occlusion   . CHF (congestive heart failure) ([place])   . Coronary artery disease   . Diverticulosis of colon without hemorrhage 01/03/2014  . Dysphagia   . Elevated liver enzymes   . Esophageal stricture   . GERD (gastroesophageal reflux disease)   . Hearing loss, mixed, bilateral   . Hiatal hernia   . Hx of cardiovascular stress test    Adenosine Myoview (07/2013):  Apical cap, apical lateral and mid anterolateral scar, small amount of peri-infarct ischemia, EF 36%; Medium Risk  . Hyperlipidemia   . Increased prostate specific antigen (PSA) velocity   . Internal hemorrhoids 01/03/2014  . Lumbar back pain   . Pacemaker    st jude  . Pacemaker infection ([place])    06/28/12  . Polymyalgia rheumatica ([place])   . Sick sinus syndrome ([facility])     Tobacco Use: Social History   Tobacco Use  Smoking Status Former  Smoker  . Packs/day: 1.00  . Years: 40.00  . Pack years: 40.00  . Types: Cigarettes  . Quit date: 03/10/1983  . Years since quitting: 35.7  Smokeless Tobacco Never Used    Labs: Recent Chemical engineer    Labs for ITP Cardiac and Pulmonary Rehab Latest Ref Rng & Units 06/15/2017 07/24/2017 07/25/2017 02/17/2018 03/04/2018   Cholestrol 0 - 200 mg/dL - 130 127 118 116   LDLCALC 0 - 99 mg/dL - 76 72 66 63   LDLDIRECT mg/dL 69.0 - - - -   HDL >40 mg/dL - 45 41 44 41   Trlycerides <150 mg/dL - 47 69 38 61   Hemoglobin A1c 4.8 - 5.6 % - 5.6 5.6 - -      Capillary Blood Glucose: Lab Results  Component Value Date   GLUCAP 116 (H)  07/07/2012     Exercise Target Goals: Exercise Program Goal: Individual exercise prescription set using results from initial 6 min walk test and THRR while considering  patient's activity barriers and safety.   Exercise Prescription Goal: Initial exercise prescription builds to 30-45 minutes a day of aerobic activity, 2-3 days per week.  Home exercise guidelines will be given to patient during program as part of exercise prescription that the participant will acknowledge.  Activity Barriers & Risk Stratification: Activity Barriers & Cardiac Risk Stratification - 11/29/18 1512      Activity Barriers & Cardiac Risk Stratification   Activity Barriers  Deconditioning;Muscular Weakness;Shortness of Breath   Neuropathy   Cardiac Risk Stratification  High       6 Minute Walk: 6 Minute Walk    Row Name 11/29/18 1511         6 Minute Walk   Phase  Initial     Distance  1207 feet     Walk Time  6 minutes     # of Rest Breaks  0     MPH  2.2     METS  1.8     RPE  13     Perceived Dyspnea   0     VO2 Peak  6.37     Symptoms  No     Resting HR  95 bpm     Resting BP  102/52     Resting Oxygen Saturation   98 %     Exercise Oxygen Saturation  during 6 min walk  99 %     Max Ex. HR  105 bpm     Max Ex. BP  112/64     2 Minute Post BP  102/62        Oxygen Initial Assessment:   Oxygen Re-Evaluation:   Oxygen Discharge (Final Oxygen Re-Evaluation):   Initial Exercise Prescription: Initial Exercise Prescription - 11/29/18 1500      Date of Initial Exercise RX and Referring Provider   Date  11/29/18    Referring Provider  Dr. Aundra Dubin     Expected Discharge Date  01/27/19      Recumbant Bike   Level  2    Watts  5    Minutes  15    METs  2.22      Arm Ergometer   Level  2    Watts  15    Minutes  15    METs  2.12      Prescription Details   Frequency (times per week)  3    Duration  Progress to 30 minutes of continuous  aerobic without signs/symptoms of  physical distress      Intensity   THRR 40-80% of Max Heartrate  53-106    Ratings of Perceived Exertion  11-13      Progression   Progression  Continue to progress workloads to maintain intensity without signs/symptoms of physical distress.      Resistance Training   Training Prescription  Yes    Weight  4 lbs.     Reps  10-15       Perform Capillary Blood Glucose checks as needed.  Exercise Prescription Changes:   Exercise Comments:   Exercise Goals and Review:  Exercise Goals    Row Name 11/29/18 1514             Exercise Goals   Increase Physical Activity  Yes       Intervention  Provide advice, education, support and counseling about physical activity/exercise needs.;Develop an individualized exercise prescription for aerobic and resistive training based on initial evaluation findings, risk stratification, comorbidities and participant's personal goals.       Expected Outcomes  Short Term: Attend rehab on a regular basis to increase amount of physical activity.;Long Term: Add in home exercise to make exercise part of routine and to increase amount of physical activity.;Long Term: Exercising regularly at least 3-5 days a week.       Increase Strength and Stamina  Yes       Intervention  Provide advice, education, support and counseling about physical activity/exercise needs.;Develop an individualized exercise prescription for aerobic and resistive training based on initial evaluation findings, risk stratification, comorbidities and participant's personal goals.       Expected Outcomes  Short Term: Increase workloads from initial exercise prescription for resistance, speed, and METs.;Short Term: Perform resistance training exercises routinely during rehab and add in resistance training at home;Long Term: Improve cardiorespiratory fitness, muscular endurance and strength as measured by increased METs and functional capacity (6MWT)       Able to understand and use rate of  perceived exertion (RPE) scale  Yes       Intervention  Provide education and explanation on how to use RPE scale       Expected Outcomes  Short Term: Able to use RPE daily in rehab to express subjective intensity level;Long Term:  Able to use RPE to guide intensity level when exercising independently       Knowledge and understanding of Target Heart Rate Range (THRR)  Yes       Intervention  Provide education and explanation of THRR including how the numbers were predicted and where they are located for reference       Expected Outcomes  Short Term: Able to state/look up THRR;Long Term: Able to use THRR to govern intensity when exercising independently;Short Term: Able to use daily as guideline for intensity in rehab       Able to check pulse independently  Yes       Intervention  Provide education and demonstration on how to check pulse in carotid and radial arteries.;Review the importance of being able to check your own pulse for safety during independent exercise       Expected Outcomes  Short Term: Able to explain why pulse checking is important during independent exercise;Long Term: Able to check pulse independently and accurately       Understanding of Exercise Prescription  Yes       Intervention  Provide education, explanation, and written materials on patient's individual exercise prescription  Expected Outcomes  Short Term: Able to explain program exercise prescription;Long Term: Able to explain home exercise prescription to exercise independently          Exercise Goals Re-Evaluation :   Discharge Exercise Prescription (Final Exercise Prescription Changes):   Nutrition:  Target Goals: Understanding of nutrition guidelines, daily intake of sodium 1500mg , cholesterol 200mg , calories 30% from fat and 7% or less from saturated fats, daily to have 5 or more servings of fruits and vegetables.  Biometrics: Pre Biometrics - 11/29/18 1514      Pre Biometrics   Height  5' 5.75"  (1.67 m)    Weight  70.1 kg    Waist Circumference  40 inches    Hip Circumference  38 inches    Waist to Hip Ratio  1.05 %    BMI (Calculated)  25.14    Triceps Skinfold  12 mm    % Body Fat  26.6 %    Grip Strength  26 kg    Flexibility  0 in    Single Leg Stand  1.12 seconds        Nutrition Therapy Plan and Nutrition Goals:   Nutrition Assessments:   Nutrition Goals Re-Evaluation:   Nutrition Goals Re-Evaluation:   Nutrition Goals Discharge (Final Nutrition Goals Re-Evaluation):   Psychosocial: Target Goals: Acknowledge presence or absence of significant depression and/or stress, maximize coping skills, provide positive support system. Participant is able to verbalize types and ability to use techniques and skills needed for reducing stress and depression.  Initial Review & Psychosocial Screening: Initial Psych Review & Screening - 11/29/18 1410      Initial Review   Current issues with  Current Stress Concerns    Source of Stress Concerns  --   political climate     Family Dynamics   Good Support System?  Yes    Comments  Mr. Kowitz denies psychosocial barriers to participation in CR and self health management. He does admit to mild stress and worry secondary to election year and political climate of [place]. He states he has a very strong support system and a positive outlook on his future. He is very satisfied with his life.      Barriers   Psychosocial barriers to participate in program  There are no identifiable barriers or psychosocial needs.      Screening Interventions   Interventions  Encouraged to exercise       Quality of Life Scores: Quality of Life - 11/29/18 1515      Quality of Life   Select  Quality of Life      Quality of Life Scores   Health/Function Pre  25.07 %    Socioeconomic Pre  27.14 %    Psych/Spiritual Pre  27.86 %    Family Pre  28.8 %    GLOBAL Pre  26.62 %      Scores of 19 and below usually indicate a poorer quality  of life in these areas.  A difference of  2-3 points is a clinically meaningful difference.  A difference of 2-3 points in the total score of the Quality of Life Index has been associated with significant improvement in overall quality of life, self-image, physical symptoms, and general health in studies assessing change in quality of life.  PHQ-9: Recent Review Flowsheet Data    Depression screen [facility] 2/9 11/29/2018 06/03/2018 09/20/2017 08/25/2016 07/15/2015   Decreased Interest 0 0 0 0 0   Down, Depressed, Hopeless 1  0 0 0 0   PHQ - 2 Score 1 0 0 0 0     Interpretation of Total Score  Total Score Depression Severity:  1-4 = Minimal depression, 5-9 = Mild depression, 10-14 = Moderate depression, 15-19 = Moderately severe depression, 20-27 = Severe depression   Psychosocial Evaluation and Intervention:   Psychosocial Re-Evaluation:   Psychosocial Discharge (Final Psychosocial Re-Evaluation):   Vocational Rehabilitation: Provide vocational rehab assistance to qualifying candidates.   Vocational Rehab Evaluation & Intervention:   Education: Education Goals: Education classes will be provided on a weekly basis, covering required topics. Participant will state understanding/return demonstration of topics presented.  Learning Barriers/Preferences: Learning Barriers/Preferences - 11/29/18 1515      Learning Barriers/Preferences   Learning Barriers  Sight;Hearing    Learning Preferences  Audio;Verbal Instruction       Education Topics: Count Your Pulse:  -Group instruction provided by verbal instruction, demonstration, patient participation and written materials to support subject.  Instructors address importance of being able to find your pulse and how to count your pulse when at home without a heart monitor.  Patients get hands on experience counting their pulse with staff help and individually.   Heart Attack, Angina, and Risk Factor Modification:  -Group instruction provided by  verbal instruction, video, and written materials to support subject.  Instructors address signs and symptoms of angina and heart attacks.    Also discuss risk factors for heart disease and how to make changes to improve heart health risk factors.   Functional Fitness:  -Group instruction provided by verbal instruction, demonstration, patient participation, and written materials to support subject.  Instructors address safety measures for doing things around the house.  Discuss how to get up and down off the floor, how to pick things up properly, how to safely get out of a chair without assistance, and balance training.   Meditation and Mindfulness:  -Group instruction provided by verbal instruction, patient participation, and written materials to support subject.  Instructor addresses importance of mindfulness and meditation practice to help reduce stress and improve awareness.  Instructor also leads participants through a meditation exercise.    Stretching for Flexibility and Mobility:  -Group instruction provided by verbal instruction, patient participation, and written materials to support subject.  Instructors lead participants through series of stretches that are designed to increase flexibility thus improving mobility.  These stretches are additional exercise for major muscle groups that are typically performed during regular warm up and cool down.   Hands Only CPR:  -Group verbal, video, and participation provides a basic overview of AHA guidelines for community CPR. Role-play of emergencies allow participants the opportunity to practice calling for help and chest compression technique with discussion of AED use.   Hypertension: -Group verbal and written instruction that provides a basic overview of hypertension including the most recent diagnostic guidelines, risk factor reduction with self-care instructions and medication management.    Nutrition I class: Heart Healthy Eating:   -Group instruction provided by PowerPoint slides, verbal discussion, and written materials to support subject matter. The instructor gives an explanation and review of the Therapeutic Lifestyle Changes diet recommendations, which includes a discussion on lipid goals, dietary fat, sodium, fiber, plant stanol/sterol esters, sugar, and the components of a well-balanced, healthy diet.   Nutrition II class: Lifestyle Skills:  -Group instruction provided by PowerPoint slides, verbal discussion, and written materials to support subject matter. The instructor gives an explanation and review of label reading, grocery shopping for heart health,  heart healthy recipe modifications, and ways to make healthier choices when eating out.   Diabetes Question & Answer:  -Group instruction provided by PowerPoint slides, verbal discussion, and written materials to support subject matter. The instructor gives an explanation and review of diabetes co-morbidities, pre- and post-prandial blood glucose goals, pre-exercise blood glucose goals, signs, symptoms, and treatment of hypoglycemia and hyperglycemia, and foot care basics.   Diabetes Blitz:  -Group instruction provided by PowerPoint slides, verbal discussion, and written materials to support subject matter. The instructor gives an explanation and review of the physiology behind type 1 and type 2 diabetes, diabetes medications and rational behind using different medications, pre- and post-prandial blood glucose recommendations and Hemoglobin A1c goals, diabetes diet, and exercise including blood glucose guidelines for exercising safely.    Portion Distortion:  -Group instruction provided by PowerPoint slides, verbal discussion, written materials, and food models to support subject matter. The instructor gives an explanation of serving size versus portion size, changes in portions sizes over the last 20 years, and what consists of a serving from each food group.    Stress Management:  -Group instruction provided by verbal instruction, video, and written materials to support subject matter.  Instructors review role of stress in heart disease and how to cope with stress positively.     Exercising on Your Own:  -Group instruction provided by verbal instruction, power point, and written materials to support subject.  Instructors discuss benefits of exercise, components of exercise, frequency and intensity of exercise, and end points for exercise.  Also discuss use of nitroglycerin and activating EMS.  Review options of places to exercise outside of rehab.  Review guidelines for sex with heart disease.   Cardiac Drugs I:  -Group instruction provided by verbal instruction and written materials to support subject.  Instructor reviews cardiac drug classes: antiplatelets, anticoagulants, beta blockers, and statins.  Instructor discusses reasons, side effects, and lifestyle considerations for each drug class.   Cardiac Drugs II:  -Group instruction provided by verbal instruction and written materials to support subject.  Instructor reviews cardiac drug classes: angiotensin converting enzyme inhibitors (ACE-I), angiotensin II receptor blockers (ARBs), nitrates, and calcium channel blockers.  Instructor discusses reasons, side effects, and lifestyle considerations for each drug class.   Anatomy and Physiology of the Circulatory System:  Group verbal and written instruction and models provide basic cardiac anatomy and physiology, with the coronary electrical and arterial systems. Review of: AMI, Angina, Valve disease, Heart Failure, Peripheral Artery Disease, Cardiac Arrhythmia, Pacemakers, and the ICD.   Other Education:  -Group or individual verbal, written, or video instructions that support the educational goals of the cardiac rehab program.   Holiday Eating Survival Tips:  -Group instruction provided by PowerPoint slides, verbal discussion, and written  materials to support subject matter. The instructor gives patients tips, tricks, and techniques to help them not only survive but enjoy the holidays despite the onslaught of food that accompanies the holidays.   Knowledge Questionnaire Score: Knowledge Questionnaire Score - 11/29/18 1516      Knowledge Questionnaire Score   Pre Score  21/24       Core Components/Risk Factors/Patient Goals at Admission: Personal Goals and Risk Factors at Admission - 11/29/18 1516      Core Components/Risk Factors/Patient Goals on Admission    Weight Management  Yes;Weight Maintenance    Intervention  Weight Management: Develop a combined nutrition and exercise program designed to reach desired caloric intake, while maintaining appropriate intake of nutrient and fiber,  sodium and fats, and appropriate energy expenditure required for the weight goal.;Weight Management: Provide education and appropriate resources to help participant work on and attain dietary goals.    Admit Weight  154 lb 8.7 oz (70.1 kg)    Expected Outcomes  Short Term: Continue to assess and modify interventions until short term weight is achieved;Long Term: Adherence to nutrition and physical activity/exercise program aimed toward attainment of established weight goal;Weight Maintenance: Understanding of the daily nutrition guidelines, which includes 25-35% calories from fat, 7% or less cal from saturated fats, less than 200mg  cholesterol, less than 1.5gm of sodium, & 5 or more servings of fruits and vegetables daily;Understanding recommendations for meals to include 15-35% energy as protein, 25-35% energy from fat, 35-60% energy from carbohydrates, less than 200mg  of dietary cholesterol, 20-35 gm of total fiber daily;Understanding of distribution of calorie intake throughout the day with the consumption of 4-5 meals/snacks    Improve shortness of breath with ADL's  Yes    Intervention  Provide education, individualized exercise plan and daily  activity instruction to help decrease symptoms of SOB with activities of daily living.    Expected Outcomes  Short Term: Improve cardiorespiratory fitness to achieve a reduction of symptoms when performing ADLs;Long Term: Be able to perform more ADLs without symptoms or delay the onset of symptoms    Hypertension  Yes    Intervention  Provide education on lifestyle modifcations including regular physical activity/exercise, weight management, moderate sodium restriction and increased consumption of fresh fruit, vegetables, and low fat dairy, alcohol moderation, and smoking cessation.;Monitor prescription use compliance.    Expected Outcomes  Short Term: Continued assessment and intervention until BP is < 140/63mm HG in hypertensive participants. < 130/6mm HG in hypertensive participants with diabetes, heart failure or chronic kidney disease.;Long Term: Maintenance of blood pressure at goal levels.    Lipids  Yes    Intervention  Provide education and support for participant on nutrition & aerobic/resistive exercise along with prescribed medications to achieve LDL 70mg , HDL >40mg .    Expected Outcomes  Short Term: Participant states understanding of desired cholesterol values and is compliant with medications prescribed. Participant is following exercise prescription and nutrition guidelines.;Long Term: Cholesterol controlled with medications as prescribed, with individualized exercise RX and with personalized nutrition plan. Value goals: LDL < 70mg , HDL > 40 mg.       Core Components/Risk Factors/Patient Goals Review:    Core Components/Risk Factors/Patient Goals at Discharge (Final Review):    ITP Comments: ITP Comments    Row Name 11/29/18 1428           ITP Comments  Medical Director- Dr. Fransico Him, MD          Comments: Patient attended orientation on 11/29/2018 to review rules and guidelines for program. Completed 6 minute walk test, Intitial ITP, and exercise prescription.  VSS.  Telemetry-Vpaced. Asymptomatic. Safety measures and social distancing in place per CDC guidelines.

## 2018-11-30 ENCOUNTER — Encounter: Payer: Self-pay | Admitting: Nurse Practitioner

## 2018-11-30 ENCOUNTER — Ambulatory Visit (INDEPENDENT_AMBULATORY_CARE_PROVIDER_SITE_OTHER): Payer: Medicare Other | Admitting: Nurse Practitioner

## 2018-11-30 VITALS — BP 92/54 | HR 62 | Temp 97.9°F | Ht 66.0 in | Wt 150.0 lb

## 2018-11-30 DIAGNOSIS — R151 Fecal smearing: Secondary | ICD-10-CM | POA: Diagnosis not present

## 2018-11-30 DIAGNOSIS — I6529 Occlusion and stenosis of unspecified carotid artery: Secondary | ICD-10-CM

## 2018-11-30 NOTE — Progress Notes (Signed)
Chief Complaint: Sensation of incomplete evacuation of stool and ongoing fecal smearing  IMPRESSION and PLAN:    85 year old male with longstanding history of sensation of incomplete rectal emptying and fecal smearing.  Improvement with pelvic floor physical therapy and fiber but internal hemorrhoid banding had the greatest impact. Last banded in  Feb 2020, interested in repeat banding.  -Patient had a large bowel movement at 9 AM today.  There was still soft stool on DRE and anoscopy precluding adequate exam for internal hemorrhoids.  With remnants of stool he probably is not having adequate emptying with bowel movements.  Continue daily fiber.  After first bowel movement of the day he can try a glycerin suppository.  This may help promote further rectal emptying.  -I will talk with Dr. Carlean Purl to see if it is reasonable/feasible to proceed with additional internal hemorrhoid banding, a course this would need to be off Eliquis     HPI:     Patient is an 85 year old male with multiple medical problems as listed below.  He is chronically anticoagulated.  Patient is well-known to Dr. Carlean Purl.  He has a long history of fecal soiling.  He underwent pelvic floor physical therapy at one point which helped but not as much as banding of internal hemorrhoids which he had in 2017, 2018.  In January  2021 patient was seen with complaints of sensation of incomplete rectal emptying / fecal smearing.  We started him on fiber, arranged for repeat hemorrhoidal banding which was done in February 2020.  Patient feels like the fiber has helped with bowel emptying though he still often has 2-3 soft BMs a day.  Patient feels like if he can empty his bowels better the first time then he would not need to have subsequent bowel movements nor would he have intermittent fecal smearing which by the way is not as bad as it used to be.  Than anything else patient feels the internal hemorrhoid banding was most effective  for the fecal smearing.  Interested in repeat hemorrhoid banding   Review of systems:     No chest pain, no SOB, no fevers, no urinary sx   Past Medical History:  Diagnosis Date   Arthritis    "back" (03/20/2014)   Atrial fibrillation (HCC)    BPH (benign prostatic hypertrophy)    Breast mass    "on both sides" (06/28/2012)   Cardiomyopathy primary-nonischemic EF 45%   Carotid artery occlusion    CHF (congestive heart failure) ([place])    Coronary artery disease    Diverticulosis of colon without hemorrhage 01/03/2014   Dysphagia    Elevated liver enzymes    Esophageal stricture    GERD (gastroesophageal reflux disease)    Hearing loss, mixed, bilateral    Hiatal hernia    Hx of cardiovascular stress test    Adenosine Myoview (07/2013):  Apical cap, apical lateral and mid anterolateral scar, small amount of peri-infarct ischemia, EF 36%; Medium Risk   Hyperlipidemia    Increased prostate specific antigen (PSA) velocity    Internal hemorrhoids 01/03/2014   Lumbar back pain    Pacemaker    st jude   Pacemaker 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     apixaban (ELIQUIS) 5 MG TABS tablet Take 1 tablet (5 mg total) by mouth 2 (two) times daily. 180 tablet 2   dutasteride (AVODART) 0.5 MG capsule Take 0.5 mg by mouth daily.      fluticasone (FLONASE) 50 MCG/ACT nasal spray Place 2 sprays into both nostrils daily as needed for allergies or rhinitis. 16 g 3   furosemide (LASIX) 80 MG tablet Take 80mg  in the AM  and 40mg  in the PM 135 tablet 3   hydroxypropyl methylcellulose / hypromellose (ISOPTO TEARS / GONIOVISC) 2.5 % ophthalmic solution Place 1 drop into both eyes 3 (three) times daily as needed for dry eyes.      losartan (COZAAR) 25 MG tablet Take 0.5 tablets (12.5 mg total) by mouth daily. 45 tablet 3   metoprolol succinate (TOPROL-XL) 25 MG 24 hr tablet Take 1 tablet (25 mg total) by mouth daily. 90 tablet 2   rosuvastatin (CRESTOR) 10 MG tablet Take 1 tablet (10 mg total) by mouth at bedtime. 90 tablet 3   spironolactone (ALDACTONE) 25 MG tablet Take 1 tablet (25 mg total) by mouth daily. (Patient taking differently: Take 12.5 mg by mouth daily. ) 90 tablet 3   tamsulosin (FLOMAX) 0.4 MG CAPS capsule Take 0.4 mg by mouth 2 (two) times daily.      triamcinolone cream (KENALOG) 0.1 % Apply 1 application topically 2 (two) times daily. For 7-10 days maximum 28.4 g 0   Wheat Dextrin (BENEFIBER DRINK MIX PO) Take by mouth. 2 tsps daily     No current facility-administered medications for this visit.     Physical Exam:     BP (!) 92/54    Pulse 62    Temp 97.9 F (36.6 C) (Temporal)    Ht 5\' 6"  (1.676 m)    Wt 150 lb (68 kg)    BMI 24.21 kg/m   GENERAL:  Pleasant male in NAD PSYCH: : Cooperative, normal affect ABDOMEN:  Nondistended, soft, nontender. No obvious masses, no hepatomegaly,  normal bowel sounds RECTAL  SKIN:  turgor, no lesions seen Musculoskeletal:  Normal muscle tone, normal strength NEURO: Alert and oriented x 3, no focal neurologic deficits   Tye Savoy , NP 11/30/2018, 11:23 AM

## 2018-11-30 NOTE — Patient Instructions (Signed)
If you are age 83 or older, your body mass index should be between 23-30. Your Body mass index is 24.21 kg/m. If this is out of the aforementioned range listed, please consider follow up with your Primary Care Provider.  If you are age 76 or younger, your body mass index should be between 19-25. Your Body mass index is 24.21 kg/m. If this is out of the aformentioned range listed, please consider follow up with your Primary Care Provider.   Start Glycerin Suppositories as needed (this is over-the-counter).  We will call if Dr. Carlean Purl wants to do hemorrhoid banding.  Thank you for choosing me and [facility].   Tye Savoy, NP

## 2018-12-03 ENCOUNTER — Other Ambulatory Visit (HOSPITAL_COMMUNITY)
Admission: RE | Admit: 2018-12-03 | Discharge: 2018-12-03 | Disposition: A | Discharge status: Home / Self Care | Payer: Medicare Other | Source: Ambulatory Visit | Attending: Cardiology | Admitting: Cardiology

## 2018-12-03 DIAGNOSIS — Z01812 Encounter for preprocedural laboratory examination: Secondary | ICD-10-CM | POA: Diagnosis not present

## 2018-12-03 DIAGNOSIS — Z20828 Contact with and (suspected) exposure to other viral communicable diseases: Secondary | ICD-10-CM | POA: Insufficient documentation

## 2018-12-03 LAB — SARS CORONAVIRUS 2 (TAT 6-24 HRS): SARS Coronavirus 2: NEGATIVE

## 2018-12-05 ENCOUNTER — Encounter (HOSPITAL_COMMUNITY): Payer: Medicare Other

## 2018-12-05 ENCOUNTER — Telehealth (HOSPITAL_COMMUNITY): Payer: Self-pay | Admitting: Family Medicine

## 2018-12-06 ENCOUNTER — Ambulatory Visit (HOSPITAL_COMMUNITY): Payer: Medicare Other | Attending: Cardiology

## 2018-12-06 ENCOUNTER — Other Ambulatory Visit (HOSPITAL_COMMUNITY): Payer: Self-pay | Admitting: *Deleted

## 2018-12-06 ENCOUNTER — Other Ambulatory Visit: Payer: Self-pay

## 2018-12-06 DIAGNOSIS — I4891 Unspecified atrial fibrillation: Secondary | ICD-10-CM | POA: Diagnosis not present

## 2018-12-06 DIAGNOSIS — I509 Heart failure, unspecified: Secondary | ICD-10-CM | POA: Insufficient documentation

## 2018-12-06 DIAGNOSIS — I428 Other cardiomyopathies: Secondary | ICD-10-CM | POA: Insufficient documentation

## 2018-12-06 DIAGNOSIS — Z950 Presence of cardiac pacemaker: Secondary | ICD-10-CM | POA: Insufficient documentation

## 2018-12-06 DIAGNOSIS — I2510 Atherosclerotic heart disease of native coronary artery without angina pectoris: Secondary | ICD-10-CM | POA: Insufficient documentation

## 2018-12-07 ENCOUNTER — Encounter (HOSPITAL_COMMUNITY)
Admission: RE | Admit: 2018-12-07 | Discharge: 2018-12-07 | Disposition: A | Discharge status: Home / Self Care | Payer: Medicare Other | Source: Ambulatory Visit | Attending: Cardiology | Admitting: Cardiology

## 2018-12-07 DIAGNOSIS — I5022 Chronic systolic (congestive) heart failure: Secondary | ICD-10-CM

## 2018-12-07 NOTE — Progress Notes (Signed)
Daily Session Note  Patient Details  Name: [name] MRN: [id number] Date of Birth: May 26, 1929 Referring Provider:     [place] from 11/29/2018 in [place]  Referring Provider  Dr. Aundra Dubin       Encounter Date: 12/07/2018  Check In: Session Check In - 12/07/18 1359      Check-In   Supervising physician immediately available to respond to emergencies  [facility] immediately available    Physician(s)  Dr. Earnest Conroy    Location  [facility]    Staff Present  Rodney Langton, RN;Danzig Macgregor Rollene Rotunda, RN, BSN;Brittany Durene Fruits, BS, ACSM CEP, Exercise Physiologist;Maria Whitaker, RN, BSN    Virtual Visit  No    Medication changes reported      No    Fall or balance concerns reported     No    Tobacco Cessation  No Change    Warm-up and Cool-down  Performed on first and last piece of equipment    Resistance Training Performed  No    VAD Patient?  No    PAD/SET Patient?  No      Pain Assessment   Currently in Pain?  No/denies    Multiple Pain Sites  No       Capillary Blood Glucose: No results found. However, due to the size of the patient record, not all encounters were searched. Please check Results Review for a complete set of results.  Exercise Prescription Changes - 12/07/18 1500      Response to Exercise   Blood Pressure (Admit)  90/58    Blood Pressure (Exercise)  110/62    Blood Pressure (Exit)  101/62    Heart Rate (Admit)  80 bpm    Heart Rate (Exercise)  105 bpm    Heart Rate (Exit)  86 bpm    Rating of Perceived Exertion (Exercise)  14    Symptoms  None    Comments  Pt tolerated exercise well first day.     Duration  Continue with 30 min of aerobic exercise without signs/symptoms of physical distress.    Intensity  THRR unchanged      Progression   Progression  Continue to progress workloads to maintain intensity without signs/symptoms of physical distress.    Average METs  2.4      Recumbant Bike   Level  2    Watts  5    Minutes  15    METs  2      Arm Ergometer   Level  2    Watts  15    Minutes  15    METs  2.87       Social History   Tobacco Use  Smoking Status Former Smoker  . Packs/day: 1.00  . Years: 40.00  . Pack years: 40.00  . Types: Cigarettes  . Quit date: 03/10/1983  . Years since quitting: 35.7  Smokeless Tobacco Never Used    Goals Met:  Exercise tolerated well No report of cardiac concerns or symptoms Strength training completed today  Goals Unmet:  Not Applicable  Comments: Pt started cardiac rehab today. Pt tolerated light exercise without difficulty. VSS, SBP remained <100 per patient norm, telemetry-V paced, asymptomatic.  Medication list reconciled. Pt denies barriers to medicaiton compliance.  PSYCHOSOCIAL ASSESSMENT:  PHQ-0. Pt exhibits positive coping skills, hopeful outlook with supportive family. No psychosocial needs identified at this time, no psychosocial interventions necessary. Pt oriented to exercise equipment  and routine.    Understanding verbalized.   Dr. Fransico Him is Medical Director for Cardiac Rehab at [facility]

## 2018-12-08 ENCOUNTER — Encounter (HOSPITAL_COMMUNITY): Payer: Medicare Other

## 2018-12-08 ENCOUNTER — Telehealth (HOSPITAL_COMMUNITY): Payer: Self-pay

## 2018-12-08 NOTE — Telephone Encounter (Signed)
Pt aware of results of CPX, no further questions endorsed

## 2018-12-08 NOTE — Telephone Encounter (Signed)
-----   Message from Larey Dresser, MD sent at 12/06/2018  4:16 PM EDT ----- Mild-moderate functional impairment compared to age.

## 2018-12-09 ENCOUNTER — Encounter (HOSPITAL_COMMUNITY)
Admission: RE | Admit: 2018-12-09 | Discharge: 2018-12-09 | Disposition: A | Discharge status: Home / Self Care | Payer: Medicare Other | Source: Ambulatory Visit | Attending: Cardiology | Admitting: Cardiology

## 2018-12-09 ENCOUNTER — Other Ambulatory Visit: Payer: Self-pay

## 2018-12-09 DIAGNOSIS — I5022 Chronic systolic (congestive) heart failure: Secondary | ICD-10-CM | POA: Insufficient documentation

## 2018-12-09 NOTE — Progress Notes (Signed)
Daily Session Note  Patient Details  Name: [name] MRN: [id number] Date of Birth: 1929/09/09 Referring Provider:     [place] from 11/29/2018 in [place]  Referring Provider  Dr. Aundra Dubin       Encounter Date: 12/09/2018  Check In: Session Check In - 12/09/18 1404      Check-In   Supervising physician immediately available to respond to emergencies  [facility] immediately available    Physician(s)  Dr. Benny Lennert    Location  [facility]    Staff Present  Jiles Garter, RN, Luisa Hart, RN, BSN;Brittany Durene Fruits, BS, ACSM CEP, Exercise Physiologist;Maria Whitaker, RN, BSN    Virtual Visit  No    Medication changes reported      No    Fall or balance concerns reported     No    Tobacco Cessation  No Change    Warm-up and Cool-down  Performed on first and last piece of equipment    Resistance Training Performed  Yes    VAD Patient?  No    PAD/SET Patient?  No      Pain Assessment   Currently in Pain?  No/denies    Multiple Pain Sites  No       Capillary Blood Glucose: No results found. However, due to the size of the patient record, not all encounters were searched. Please check Results Review for a complete set of results.    Social History   Tobacco Use  Smoking Status Former Smoker  . Packs/day: 1.00  . Years: 40.00  . Pack years: 40.00  . Types: Cigarettes  . Quit date: 03/10/1983  . Years since quitting: 35.7  Smokeless Tobacco Never Used    Goals Met:  Exercise tolerated well No report of cardiac concerns or symptoms Strength training completed today  Goals Unmet:  BP  Check in 104/64 manual Bike 88/52, recheck 96/58 automatic cuff NuStep 107/51automatic cuff Check out 102/62 automatic cuff  Comments: Patient remained asymptomatic during exercise. Does not check BP at home prior to and after exercise. Encouraged patient to check and record BP daily and bring BP log  with him to next week's exercise sessions. Medications reviewed and patient acknowledges daily compliance. Will route note to Dr. Aundra Dubin as Juluis Rainier. Will continue to monitor patient closely during CR exercise sessions.   Dr. Fransico Him is Medical Director for Cardiac Rehab at [facility].

## 2018-12-12 ENCOUNTER — Other Ambulatory Visit: Payer: Self-pay

## 2018-12-12 ENCOUNTER — Encounter (HOSPITAL_COMMUNITY)
Admission: RE | Admit: 2018-12-12 | Discharge: 2018-12-12 | Disposition: A | Discharge status: Home / Self Care | Payer: Medicare Other | Source: Ambulatory Visit | Attending: Cardiology | Admitting: Cardiology

## 2018-12-12 DIAGNOSIS — I5022 Chronic systolic (congestive) heart failure: Secondary | ICD-10-CM | POA: Diagnosis not present

## 2018-12-14 ENCOUNTER — Other Ambulatory Visit: Payer: Self-pay

## 2018-12-14 ENCOUNTER — Encounter (HOSPITAL_COMMUNITY)
Admission: RE | Admit: 2018-12-14 | Discharge: 2018-12-14 | Disposition: A | Discharge status: Home / Self Care | Payer: Medicare Other | Source: Ambulatory Visit | Attending: Cardiology | Admitting: Cardiology

## 2018-12-14 ENCOUNTER — Ambulatory Visit: Payer: Medicare Other | Admitting: Internal Medicine

## 2018-12-14 DIAGNOSIS — I5022 Chronic systolic (congestive) heart failure: Secondary | ICD-10-CM | POA: Diagnosis not present

## 2018-12-15 ENCOUNTER — Other Ambulatory Visit: Payer: Self-pay

## 2018-12-16 ENCOUNTER — Other Ambulatory Visit: Payer: Self-pay

## 2018-12-16 ENCOUNTER — Encounter (HOSPITAL_COMMUNITY): Payer: Medicare Other

## 2018-12-16 ENCOUNTER — Encounter (HOSPITAL_COMMUNITY)
Admission: RE | Admit: 2018-12-16 | Discharge: 2018-12-16 | Disposition: A | Discharge status: Home / Self Care | Payer: Medicare Other | Source: Ambulatory Visit | Attending: Cardiology | Admitting: Cardiology

## 2018-12-16 DIAGNOSIS — I5022 Chronic systolic (congestive) heart failure: Secondary | ICD-10-CM

## 2018-12-16 NOTE — Progress Notes (Signed)
I have reviewed a Home Exercise Prescription with [name] . [name] is currently exercising at home.  The patient was advised to walk 5-7 days a week for 30-45 minutes.  [name] and I discussed how to progress their exercise prescription.  The patient stated that their goals were to continue taking water aerobics classes 2-3 days per week in addition to CR program.  The patient stated that they understand the exercise prescription.  We reviewed exercise guidelines, target heart rate during exercise, RPE Scale, weather conditions, NTG use, endpoints for exercise, warmup and cool down.  Patient is encouraged to come to me with any questions. I will continue to follow up with the patient to assist them with progression and safety.    [name] BS, ACSM CEP 12/16/2018 10:37 AM

## 2018-12-19 ENCOUNTER — Encounter (HOSPITAL_COMMUNITY)
Admission: RE | Admit: 2018-12-19 | Discharge: 2018-12-19 | Disposition: A | Discharge status: Home / Self Care | Payer: Medicare Other | Source: Ambulatory Visit | Attending: Cardiology | Admitting: Cardiology

## 2018-12-19 ENCOUNTER — Other Ambulatory Visit: Payer: Self-pay

## 2018-12-19 ENCOUNTER — Encounter (HOSPITAL_COMMUNITY): Payer: Medicare Other

## 2018-12-19 DIAGNOSIS — I5022 Chronic systolic (congestive) heart failure: Secondary | ICD-10-CM | POA: Diagnosis not present

## 2018-12-20 NOTE — Progress Notes (Signed)
Cardiac Individual Treatment Plan  Patient Details  Name: WINIFRED BALOGH MRN: [id number] Date of Birth: [birth year] Referring Provider:     CARDIAC REHAB PHASE II ORIENTATION from 11/29/2018 in [place]  Referring Provider  Dr. Aundra Dubin       Initial Encounter Date:    CARDIAC REHAB PHASE II ORIENTATION from 11/29/2018 in [place]  Date  11/29/18      Visit Diagnosis: Heart failure, chronic systolic ([place])  Patient's Home Medications on Admission:  Current Outpatient Medications:    acetaminophen (TYLENOL) 500 MG tablet, Take 1,000 mg by mouth every 6 (six) hours as needed for moderate pain or headache., Disp: , Rfl:    apixaban (ELIQUIS) 5 MG TABS tablet, Take 1 tablet (5 mg total) by mouth 2 (two) times daily., Disp: 180 tablet, Rfl: 2   dutasteride (AVODART) 0.5 MG capsule, Take 0.5 mg by mouth daily. , Disp: , Rfl:    fluticasone (FLONASE) 50 MCG/ACT nasal spray, Place 2 sprays into both nostrils daily as needed for allergies or rhinitis., Disp: 16 g, Rfl: 3   furosemide (LASIX) 80 MG tablet, Take 83m in the AM and 433min the PM, Disp: 135 tablet, Rfl: 3   hydroxypropyl methylcellulose / hypromellose (ISOPTO TEARS / GONIOVISC) 2.5 % ophthalmic solution, Place 1 drop into both eyes 3 (three) times daily as needed for dry eyes. , Disp: , Rfl:    losartan (COZAAR) 25 MG tablet, Take 0.5 tablets (12.5 mg total) by mouth daily., Disp: 45 tablet, Rfl: 3   metoprolol succinate (TOPROL-XL) 25 MG 24 hr tablet, Take 1 tablet (25 mg total) by mouth daily., Disp: 90 tablet, Rfl: 2   rosuvastatin (CRESTOR) 10 MG tablet, Take 1 tablet (10 mg total) by mouth at bedtime., Disp: 90 tablet, Rfl: 3   spironolactone (ALDACTONE) 25 MG tablet, Take 1 tablet (25 mg total) by mouth daily. (Patient taking differently: Take 12.5 mg by mouth daily. ), Disp: 90 tablet, Rfl: 3   tamsulosin (FLOMAX) 0.4 MG CAPS capsule, Take 0.4 mg by  mouth 2 (two) times daily. , Disp: , Rfl:    triamcinolone cream (KENALOG) 0.1 %, Apply 1 application topically 2 (two) times daily. For 7-10 days maximum, Disp: 28.4 g, Rfl: 0   Wheat Dextrin (BENEFIBER DRINK MIX PO), Take by mouth. 2 tsps daily, Disp: , Rfl:   Past Medical History: Past Medical History:  Diagnosis Date   Arthritis    "back" (03/20/2014)   Atrial fibrillation (HCC)    BPH (benign prostatic hypertrophy)    Breast mass    "on both sides" (06/28/2012)   Cardiomyopathy primary-nonischemic EF 45%   Carotid artery occlusion    CHF (congestive heart failure) (HCC)    Coronary artery disease    Diverticulosis of colon without hemorrhage 01/03/2014   Dysphagia    Elevated liver enzymes    Esophageal stricture    GERD (gastroesophageal reflux disease)    Hearing loss, mixed, bilateral    Hiatal hernia    Hx of cardiovascular stress test    Adenosine Myoview (07/2013):  Apical cap, apical lateral and mid anterolateral scar, small amount of peri-infarct ischemia, EF 36%; Medium Risk   Hyperlipidemia    Increased prostate specific antigen (PSA) velocity    Internal hemorrhoids 01/03/2014   Lumbar back pain    Pacemaker    [facility]   [place]   06/28/12   Pol[place]algia rheumatica ([facility]  Sick sinus syndrome (HCC)     Tobacco Use: Social History   Tobacco Use  Smoking Status Former Smoker   Packs/day: 1.00   Years: 40.00   Pack years: 40.00   Types: Cigarettes   Quit date: 03/10/1983   Years since quitting: 35.8  Smokeless Tobacco Never Used    Labs: Recent Chemical engineer    Labs for ITP Cardiac and Pulmonary Rehab Latest Ref Rng & Units 06/15/2017 07/24/2017 07/25/2017 02/17/2018 03/04/2018   Cholestrol 0 - 200 mg/dL - 130 127 118 116   LDLCALC 0 - 99 mg/dL - 76 72 66 63   LDLDIRECT mg/dL 69.0 - - - -   HDL >40 mg/dL - 45 41 44 41   Trlycerides <150 mg/dL - 47 69 38 61   Hemoglobin A1c 4.8 - 5.6 % - 5.6 5.6 -  -      Capillary Blood Glucose: Lab Results  Component Value Date   GLUCAP 116 (H) 07/07/2012     Exercise Target Goals: Exercise Program Goal: Individual exercise prescription set using results from initial 6 min walk test and THRR while considering  patients activity barriers and safety.   Exercise Prescription Goal: Starting with aerobic activity 30 plus minutes a day, 3 days per week for initial exercise prescription. Provide home exercise prescription and guidelines that participant acknowledges understanding prior to discharge.  Activity Barriers & Risk Stratification: Activity Barriers & Cardiac Risk Stratification - 11/29/18 1512      Activity Barriers & Cardiac Risk Stratification   Activity Barriers  Deconditioning;Muscular Weakness;Shortness of Breath   Neuropathy   Cardiac Risk Stratification  High       6 Minute Walk: 6 Minute Walk    Row Name 11/29/18 1511         6 Minute Walk   Phase  Initial     Distance  1207 feet     Walk Time  6 minutes     # of Rest Breaks  0     MPH  2.2     METS  1.8     RPE  13     Perceived Dyspnea   0     VO2 Peak  6.37     Symptoms  No     Resting HR  95 bpm     Resting BP  102/52     Resting Oxygen Saturation   98 %     Exercise Oxygen Saturation  during 6 min walk  99 %     Max Ex. HR  105 bpm     Max Ex. BP  112/64     2 Minute Post BP  102/62        Oxygen Initial Assessment:   Oxygen Re-Evaluation:   Oxygen Discharge (Final Oxygen Re-Evaluation):   Initial Exercise Prescription: Initial Exercise Prescription - 11/29/18 1500      Date of Initial Exercise RX and Referring Provider   Date  11/29/18    Referring Provider  Dr. Aundra Dubin     Expected Discharge Date  01/27/19      Recumbant Bike   Level  2    Watts  5    Minutes  15    METs  2.22      Arm Ergometer   Level  2    Watts  15    Minutes  15    METs  2.12      Prescription Details   Frequency (times per week)  3    Duration   Progress to 30 minutes of continuous aerobic without signs/symptoms of physical distress      Intensity   THRR 40-80% of Max Heartrate  53-106    Ratings of Perceived Exertion  11-13      Progression   Progression  Continue to progress workloads to maintain intensity without signs/symptoms of physical distress.      Resistance Training   Training Prescription  Yes    Weight  4 lbs.     Reps  10-15       Perform Capillary Blood Glucose checks as needed.  Exercise Prescription Changes:  Exercise Prescription Changes    Row Name 12/07/18 1500 12/16/18 1000 12/19/18 0845         Response to Exercise   Blood Pressure (Admit)  90/58  98/52  90/50     Blood Pressure (Exercise)  110/62  104/62  116/74     Blood Pressure (Exit)  101/62  96/60  95/57     Heart Rate (Admit)  80 bpm  97 bpm  88 bpm     Heart Rate (Exercise)  105 bpm  107 bpm  105 bpm     Heart Rate (Exit)  86 bpm  96 bpm  68 bpm     Rating of Perceived Exertion (Exercise)  _0 Symptoms  None  None  None     Comments  Pt tolerated exercise well first day.   --  --     Duration  Continue with 30 min of aerobic exercise without signs/symptoms of physical distress.  Continue with 30 min of aerobic exercise without signs/symptoms of physical distress.  Continue with 30 min of aerobic exercise without signs/symptoms of physical distress.     Intensity  THRR unchanged  THRR unchanged  THRR unchanged       Progression   Progression  Continue to progress workloads to maintain intensity without signs/symptoms of physical distress.  Continue to progress workloads to maintain intensity without signs/symptoms of physical distress.  Continue to progress workloads to maintain intensity without signs/symptoms of physical distress.     Average METs  2.4  2.4  3.2       Resistance Training   Training Prescription  --  Yes  Yes     Weight  --  4 lbs.   4 lbs.      Reps  --  10-15  10-15     Time  --  10 Minutes  10 Minutes        Interval Training   Interval Training  --  --  No       Recumbant Bike   Level  _1 Watts  5  5  --     Minutes  _2 METs  2  2  4.8       Arm Ergometer   Level  _3 Watts  _4 Minutes  _5 METs  2.87  2.87  1.75       Home Exercise Plan   Plans to continue exercise at  --  Home (comment)  Home (comment)     Frequency  --  Add 3 additional days to program exercise sessions.  Add  3 additional days to program exercise sessions.     Initial Home Exercises Provided  --  12/16/18  12/16/18        Exercise Comments:  Exercise Comments    Row Name 12/07/18 1506 12/16/18 1040 12/20/18 1137       Exercise Comments  Pt first day of CR program. Pt tolerated exercise Rx well.  Reviewed HEP with Pt. Pt understands goals.  Reviewed METs and goals with Pt. Pt is progressing well.        Exercise Goals and Review:  Exercise Goals    Row Name 11/29/18 1514             Exercise Goals   Increase Physical Activity  Yes       Intervention  Provide advice, education, support and counseling about physical activity/exercise needs.;Develop an individualized exercise prescription for aerobic and resistive training based on initial evaluation findings, risk stratification, comorbidities and participant's personal goals.       Expected Outcomes  Short Term: Attend rehab on a regular basis to increase amount of physical activity.;Long Term: Add in home exercise to make exercise part of routine and to increase amount of physical activity.;Long Term: Exercising regularly at least 3-5 days a week.       Increase Strength and Stamina  Yes       Intervention  Provide advice, education, support and counseling about physical activity/exercise needs.;Develop an individualized exercise prescription for aerobic and resistive training based on initial evaluation findings, risk stratification, comorbidities and participant's personal goals.       Expected  Outcomes  Short Term: Increase workloads from initial exercise prescription for resistance, speed, and METs.;Short Term: Perform resistance training exercises routinely during rehab and add in resistance training at home;Long Term: Improve cardiorespiratory fitness, muscular endurance and strength as measured by increased METs and functional capacity (6MWT)       Able to understand and use rate of perceived exertion (RPE) scale  Yes       Intervention  Provide education and explanation on how to use RPE scale       Expected Outcomes  Short Term: Able to use RPE daily in rehab to express subjective intensity level;Long Term:  Able to use RPE to guide intensity level when exercising independently       Knowledge and understanding of Target Heart Rate Range (THRR)  Yes       Intervention  Provide education and explanation of THRR including how the numbers were predicted and where they are located for reference       Expected Outcomes  Short Term: Able to state/look up THRR;Long Term: Able to use THRR to govern intensity when exercising independently;Short Term: Able to use daily as guideline for intensity in rehab       Able to check pulse independently  Yes       Intervention  Provide education and demonstration on how to check pulse in carotid and radial arteries.;Review the importance of being able to check your own pulse for safety during independent exercise       Expected Outcomes  Short Term: Able to explain why pulse checking is important during independent exercise;Long Term: Able to check pulse independently and accurately       Understanding of Exercise Prescription  Yes       Intervention  Provide education, explanation, and written materials on patient's individual exercise prescription       Expected Outcomes  Short Term: Able to explain  program exercise prescription;Long Term: Able to explain home exercise prescription to exercise independently          Exercise Goals Re-Evaluation  : Exercise Goals Re-Evaluation    Row Name 12/07/18 1503 12/16/18 1039 12/20/18 1135         Exercise Goal Re-Evaluation   Exercise Goals Review  Increase Physical Activity;Increase Strength and Stamina;Able to understand and use rate of perceived exertion (RPE) scale;Knowledge and understanding of Target Heart Rate Range (THRR);Understanding of Exercise Prescription  Increase Physical Activity;Increase Strength and Stamina;Able to understand and use rate of perceived exertion (RPE) scale;Knowledge and understanding of Target Heart Rate Range (THRR);Able to check pulse independently;Understanding of Exercise Prescription  Increase Physical Activity;Increase Strength and Stamina;Able to understand and use rate of perceived exertion (RPE) scale;Knowledge and understanding of Target Heart Rate Range (THRR);Able to check pulse independently;Understanding of Exercise Prescription     Comments  Pt tolerated first day of CR program well. Pt understands THRR, RPE scale, and exercise Rx.  Reviewed HEP with Pt. Pt understands goals, THRR, RPE scale, weather precautions, end points of exercise, warm up and cool down stretches, and pulse counting. Pt is currently taking water aerobics 2-3 days per week in addition to CR program.  Reviewed METs and goals wiht Pt. Pt is progressing well and has a MET level of 3.2. Pt continues to increase workloads. Pt understands home exercise goals and is currently taking water aerobics 2-3 days per week in addition to CR program.     Expected Outcomes  Will continue to monitor and progress pt as tolerated.  Will continue to monitor and progress Pt as tolerated.  Will continue to monitor and progress Pt as tolerated.         Discharge Exercise Prescription (Final Exercise Prescription Changes): Exercise Prescription Changes - 12/19/18 0845      Response to Exercise   Blood Pressure (Admit)  90/50    Blood Pressure (Exercise)  116/74    Blood Pressure (Exit)  95/57    Heart  Rate (Admit)  88 bpm    Heart Rate (Exercise)  105 bpm    Heart Rate (Exit)  68 bpm    Rating of Perceived Exertion (Exercise)  13    Symptoms  None    Duration  Continue with 30 min of aerobic exercise without signs/symptoms of physical distress.    Intensity  THRR unchanged      Progression   Progression  Continue to progress workloads to maintain intensity without signs/symptoms of physical distress.    Average METs  3.2      Resistance Training   Training Prescription  Yes    Weight  4 lbs.     Reps  10-15    Time  10 Minutes      Interval Training   Interval Training  No      Recumbant Bike   Level  4    Minutes  15    METs  4.8      Arm Ergometer   Level  3    Watts  10    Minutes  15    METs  1.75      Home Exercise Plan   Plans to continue exercise at  Home (comment)    Frequency  Add 3 additional days to program exercise sessions.    Initial Home Exercises Provided  12/16/18       Nutrition:  Target Goals: Understanding of nutrition guidelines, daily intake of sodium <1573m,  cholesterol <231m, calories 30% from fat and 7% or less from saturated fats, daily to have 5 or more servings of fruits and vegetables.  Biometrics: Pre Biometrics - 11/29/18 1514      Pre Biometrics   Height  5' 5.75" (1.67 m)    Weight  70.1 kg    Waist Circumference  40 inches    Hip Circumference  38 inches    Waist to Hip Ratio  1.05 %    BMI (Calculated)  25.14    Triceps Skinfold  12 mm    % Body Fat  26.6 %    Grip Strength  26 kg    Flexibility  0 in    Single Leg Stand  1.12 seconds        Nutrition Therapy Plan and Nutrition Goals:   Nutrition Assessments:   Nutrition Goals Re-Evaluation:   Nutrition Goals Discharge (Final Nutrition Goals Re-Evaluation):   Psychosocial: Target Goals: Acknowledge presence or absence of significant depression and/or stress, maximize coping skills, provide positive support system. Participant is able to verbalize types  and ability to use techniques and skills needed for reducing stress and depression.  Initial Review & Psychosocial Screening: Initial Psych Review & Screening - 11/29/18 1410      Initial Review   Current issues with  Current Stress Concerns    Source of Stress Concerns  --   political climate     Family Dynamics   Good Support System?  Yes    Comments  Mr. MTadenies psychosocial barriers to participation in CR and self health management. He does admit to mild stress and worry secondary to election year and political climate of [place] He states he has a very strong support system and a positive outlook on his future. He is very satisfied with his life.      Barriers   Psychosocial barriers to participate in program  There are no identifiable barriers or psychosocial needs.      Screening Interventions   Interventions  Encouraged to exercise       Quality of Life Scores: Quality of Life - 11/29/18 1515      Quality of Life   Select  Quality of Life      Quality of Life Scores   Health/Function Pre  25.07 %    Socioeconomic Pre  27.14 %    Psych/Spiritual Pre  27.86 %    Family Pre  28.8 %    GLOBAL Pre  26.62 %      Scores of 19 and below usually indicate a poorer quality of life in these areas.  A difference of  2-3 points is a clinically meaningful difference.  A difference of 2-3 points in the total score of the Quality of Life Index has been associated with significant improvement in overall quality of life, self-image, physical symptoms, and general health in studies assessing change in quality of life.  PHQ-9: Recent Review Flowsheet Data    Dep[facility] 11/29/2018 06/03/2018 09/20/2017 08/25/2016   Decreased Interest 0 0 0 0   Down, Depressed, Hopeless 1  0 0 0   PHQ - 2 Score 1 0 0 0     Interpretation of Total Score  Total Score Depression Severity:  1-4 = Minimal depression, 5-9 = Mild depression, 10-14 = Moderate depression, 15-19 = Moderately  severe depression, 20-27 = Severe depression   Psychosocial Evaluation and Intervention: Psychosocial Evaluation - 12/07/18 1601      Psychosocial  Evaluation & Interventions   Interventions  Encouraged to exercise with the program and follow exercise prescription    Comments  Patient states he is feeling mild stress related to political climate however this is not a barrier to his participation in Cardiac Rehab or health management. He enjoys playing bridge, reading, and watching TV and utilizes these activities for healthy stress management.    Expected Outcomes  Patient will continue to have a positive attitude and utilize healthy stress management stratagies to deal with the mild stress he is experiencing secondary to political climate. He has a strong support system and will utilize for support if psychosocial needs arise.    Continue Psychosocial Services   No Follow up required       Psychosocial Re-Evaluation: Psychosocial Re-Evaluation    [place] Name 12/16/18 1213 12/16/18 1215 12/20/18 1542         Psychosocial Re-Evaluation   Current issues with  None Identified  --  None Identified     Comments  Mr. Ethelda Chick denies psychosocial barriers to participation in CR or self health management. He continues to have a positive outlook and acknolodges a strong support system of family, friends, and healthcare providers.  --  Mr. Ethelda Chick denies psychosocial barriers to participation in CR or self health management. He continues to have a positive outlook and acknolodges a strong support system of family, friends, and healthcare providers.     Expected Outcomes  --  Mr. finnegan gatta continue to utilize positive coping skills and healthy stress management for any psychosocial barriers that may arise.  Mr. ramonte mena continue to utilize positive coping skills and healthy stress management for any psychosocial barriers that may arise.     Continue Psychosocial Services   --  --  No Follow  up required        Psychosocial Discharge (Final Psychosocial Re-Evaluation): Psychosocial Re-Evaluation - 12/20/18 1542      Psychosocial Re-Evaluation   Current issues with  None Identified    Comments  Mr. Ethelda Chick denies psychosocial barriers to participation in CR or self health management. He continues to have a positive outlook and acknolodges a strong support system of family, friends, and healthcare providers.    Expected Outcomes  Mr. durward matranga continue to utilize positive coping skills and healthy stress management for any psychosocial barriers that may arise.    Continue Psychosocial Services   No Follow up required       Vocational Rehabilitation: Provide vocational rehab assistance to qualifying candidates.   Vocational Rehab Evaluation & Intervention:   Education: Education Goals: Education classes will be provided on a weekly basis, covering required topics. Participant will state understanding/return demonstration of topics presented.  Learning Barriers/Preferences: Learning Barriers/Preferences - 11/29/18 1515      Learning Barriers/Preferences   Learning Barriers  Sight;Hearing    Learning Preferences  Audio;Verbal Instruction       Education Topics: Hypertension, Hypertension Reduction -Define heart disease and high blood pressure. Discus how high blood pressure affects the body and ways to reduce high blood pressure.   Exercise and Your Heart -Discuss why it is important to exercise, the FITT principles of exercise, normal and abnormal responses to exercise, and how to exercise safely.   Angina -Discuss definition of angina, causes of angina, treatment of angina, and how to decrease risk of having angina.   Cardiac Medications -Review what the following cardiac medications are used for, how they affect the body, and side effects that may occur when  taking the medications.  Medications include Aspirin, Beta blockers, calcium channel blockers,  ACE Inhibitors, angiotensin receptor blockers, diuretics, digoxin, and antihyperlipidemics.   Congestive Heart Failure -Discuss the definition of CHF, how to live with CHF, the signs and symptoms of CHF, and how keep track of weight and sodium intake.   Heart Disease and Intimacy -Discus the effect sexual activity has on the heart, how changes occur during intimacy as we age, and safety during sexual activity.   Smoking Cessation / COPD -Discuss different methods to quit smoking, the health benefits of quitting smoking, and the definition of COPD.   Nutrition I: Fats -Discuss the types of cholesterol, what cholesterol does to the heart, and how cholesterol levels can be controlled.   Nutrition II: Labels -Discuss the different components of food labels and how to read food label   Heart Parts/Heart Disease and PAD -Discuss the anatomy of the heart, the pathway of blood circulation through the heart, and these are affected by heart disease.   Stress I: Signs and Symptoms -Discuss the causes of stress, how stress may lead to anxiety and depression, and ways to limit stress.   Stress II: Relaxation -Discuss different types of relaxation techniques to limit stress.   Warning Signs of Stroke / TIA -Discuss definition of a stroke, what the signs and symptoms are of a stroke, and how to identify when someone is having stroke.   Knowledge Questionnaire Score: Knowledge Questionnaire Score - 11/29/18 1516      Knowledge Questionnaire Score   Pre Score  21/24       Core Components/Risk Factors/Patient Goals at Admission: Personal Goals and Risk Factors at Admission - 11/29/18 1516      Core Components/Risk Factors/Patient Goals on Admission    Weight Management  Yes;Weight Maintenance    Intervention  Weight Management: Develop a combined nutrition and exercise program designed to reach desired caloric intake, while maintaining appropriate intake of nutrient and fiber,  sodium and fats, and appropriate energy expenditure required for the weight goal.;Weight Management: Provide education and appropriate resources to help participant work on and attain dietary goals.    Admit Weight  154 lb 8.7 oz (70.1 kg)    Expected Outcomes  Short Term: Continue to assess and modify interventions until short term weight is achieved;Long Term: Adherence to nutrition and physical activity/exercise program aimed toward attainment of established weight goal;Weight Maintenance: Understanding of the daily nutrition guidelines, which includes 25-35% calories from fat, 7% or less cal from saturated fats, less than 213m cholesterol, less than 1.5gm of sodium, & 5 or more servings of fruits and vegetables daily;Understanding recommendations for meals to include 15-35% energy as protein, 25-35% energy from fat, 35-60% energy from carbohydrates, less than 2060mof dietary cholesterol, 20-35 gm of total fiber daily;Understanding of distribution of calorie intake throughout the day with the consumption of 4-5 meals/snacks    Improve shortness of breath with ADL's  Yes    Intervention  Provide education, individualized exercise plan and daily activity instruction to help decrease symptoms of SOB with activities of daily living.    Expected Outcomes  Short Term: Improve cardiorespiratory fitness to achieve a reduction of symptoms when performing ADLs;Long Term: Be able to perform more ADLs without symptoms or delay the onset of symptoms    Hypertension  Yes    Intervention  Provide education on lifestyle modifcations including regular physical activity/exercise, weight management, moderate sodium restriction and increased consumption of fresh fruit, vegetables, and low fat dairy,  alcohol moderation, and smoking cessation.;Monitor prescription use compliance.    Expected Outcomes  Short Term: Continued assessment and intervention until BP is < 140/54m HG in hypertensive participants. < 130/84mHG in  hypertensive participants with diabetes, heart failure or chronic kidney disease.;Long Term: Maintenance of blood pressure at goal levels.    Lipids  Yes    Intervention  Provide education and support for participant on nutrition & aerobic/resistive exercise along with prescribed medications to achieve LDL <7073mHDL >35m16m  Expected Outcomes  Short Term: Participant states understanding of desired cholesterol values and is compliant with medications prescribed. Participant is following exercise prescription and nutrition guidelines.;Long Term: Cholesterol controlled with medications as prescribed, with individualized exercise RX and with personalized nutrition plan. Value goals: LDL < 70mg58mL > 40 mg.       Core Components/Risk Factors/Patient Goals Review:  Goals and Risk Factor Review    Row Name 12/07/18 1607 12/16/18 1216 12/20/18 1542         Core Components/Risk Factors/Patient Goals Review   Personal Goals Review  Weight Management/Obesity;Lipids;Improve shortness of breath with ADL's;Hypertension;Heart Failure  Weight Management/Obesity;Lipids;Improve shortness of breath with ADL's;Hypertension;Heart Failure  Weight Management/Obesity;Lipids;Improve shortness of breath with ADL's;Hypertension;Heart Failure     Review  Patient has multiple CAD risk factors. He is eager to participate in cardiac rehab.  Patient continues to have multiple CAD risk factors but feels his shortness of breath is improving with weekly participation in CR. He continues to take all medications as prescribed including medications for HTN, lipids and HF. His weight remains stable.  GeorgRufusinues to have multiple CAD risk factors but feels his shortness of breath is improving with weekly participation in CR. He continues to take all medications as prescribed including medications for HTN, lipids and HF. His weight remains stable.     Expected Outcomes  Patient will continue to participate in CR to modify risk  factors and improve his quality of life secondary to heart failure. He would like to improve his shortnesss of breath and strengthen his legs. His ultimate goal is to maintain a healthy lifestyle.  Patient will continue to participate in CR to modify risk factors and improve his quality of life secondary to heart failure. He would like to improve his shortnesss of breath and strengthen his legs. His ultimate goal is to maintain a healthy lifestyle.  Patient will continue to participate in CR to modify risk factors and improve his quality of life secondary to heart failure. He would like to improve his shortnesss of breath and strengthen his legs. His ultimate goal is to maintain a healthy lifestyle.        Core Components/Risk Factors/Patient Goals at Discharge (Final Review):  Goals and Risk Factor Review - 12/20/18 1542      Core Components/Risk Factors/Patient Goals Review   Personal Goals Review  Weight Management/Obesity;Lipids;Improve shortness of breath with ADL's;Hypertension;Heart Failure    Review  GeorgElmerinues to have multiple CAD risk factors but feels his shortness of breath is improving with weekly participation in CR. He continues to take all medications as prescribed including medications for HTN, lipids and HF. His weight remains stable.    Expected Outcomes  Patient will continue to participate in CR to modify risk factors and improve his quality of life secondary to heart failure. He would like to improve his shortnesss of breath and strengthen his legs. His ultimate goal is to maintain a healthy lifestyle.  ITP Comments: ITP Comments    Row Name 11/29/18 1428 12/16/18 1210         ITP Comments  Medical Director- Dr. Fransico Him, MD  30 day ITP review: Mr. Loudermilk continue to do well in CR. He is tolerating workload increases and continues to deny cardiac symptoms. His bloodpressure remains on the lower side SBP 90s, but denies complaints. His blood pressure  responds appropriately to exercise. He continues to exercise at home on days he does not attend CR. He denies any psychosocial barriers to participation.         Comments: See ITP comments.Exercise flow sheets faxed to Dr Claris Gladden office as the patient continues to have systolic blood pressures in the low 90's and remains asymptomatic with exercise. Barnet Pall, RN,BSN 12/22/2018 11:45 AM

## 2018-12-21 ENCOUNTER — Other Ambulatory Visit: Payer: Self-pay

## 2018-12-21 ENCOUNTER — Encounter (HOSPITAL_COMMUNITY)
Admission: RE | Admit: 2018-12-21 | Discharge: 2018-12-21 | Disposition: A | Discharge status: Home / Self Care | Payer: Medicare Other | Source: Ambulatory Visit | Attending: Cardiology | Admitting: Cardiology

## 2018-12-21 ENCOUNTER — Encounter (HOSPITAL_COMMUNITY): Payer: Medicare Other

## 2018-12-21 DIAGNOSIS — I5022 Chronic systolic (congestive) heart failure: Secondary | ICD-10-CM | POA: Diagnosis not present

## 2018-12-22 DIAGNOSIS — Z23 Encounter for immunization: Secondary | ICD-10-CM | POA: Diagnosis not present

## 2018-12-23 ENCOUNTER — Other Ambulatory Visit: Payer: Self-pay

## 2018-12-23 ENCOUNTER — Encounter (HOSPITAL_COMMUNITY): Payer: Medicare Other

## 2018-12-23 ENCOUNTER — Encounter (HOSPITAL_COMMUNITY)
Admission: RE | Admit: 2018-12-23 | Discharge: 2018-12-23 | Disposition: A | Discharge status: Home / Self Care | Payer: Medicare Other | Source: Ambulatory Visit | Attending: Cardiology | Admitting: Cardiology

## 2018-12-23 DIAGNOSIS — I5022 Chronic systolic (congestive) heart failure: Secondary | ICD-10-CM

## 2018-12-26 ENCOUNTER — Encounter (HOSPITAL_COMMUNITY)
Admission: RE | Admit: 2018-12-26 | Discharge: 2018-12-26 | Disposition: A | Discharge status: Home / Self Care | Payer: Medicare Other | Source: Ambulatory Visit | Attending: Cardiology | Admitting: Cardiology

## 2018-12-26 ENCOUNTER — Other Ambulatory Visit: Payer: Self-pay

## 2018-12-26 ENCOUNTER — Encounter (HOSPITAL_COMMUNITY): Payer: Medicare Other

## 2018-12-26 DIAGNOSIS — I5022 Chronic systolic (congestive) heart failure: Secondary | ICD-10-CM

## 2018-12-27 ENCOUNTER — Encounter (HOSPITAL_COMMUNITY): Payer: Self-pay

## 2018-12-27 ENCOUNTER — Encounter (HOSPITAL_COMMUNITY): Payer: Medicare Other

## 2018-12-27 ENCOUNTER — Encounter (HOSPITAL_COMMUNITY)
Admission: RE | Admit: 2018-12-27 | Discharge: 2018-12-27 | Disposition: A | Discharge status: Home / Self Care | Payer: Medicare Other | Source: Ambulatory Visit | Attending: Cardiology | Admitting: Cardiology

## 2018-12-28 ENCOUNTER — Encounter (HOSPITAL_COMMUNITY): Payer: Medicare Other

## 2018-12-28 ENCOUNTER — Other Ambulatory Visit: Payer: Self-pay

## 2018-12-28 ENCOUNTER — Encounter (HOSPITAL_COMMUNITY)
Admission: RE | Admit: 2018-12-28 | Discharge: 2018-12-28 | Disposition: A | Discharge status: Home / Self Care | Payer: Medicare Other | Source: Ambulatory Visit | Attending: Cardiology | Admitting: Cardiology

## 2018-12-28 DIAGNOSIS — I5022 Chronic systolic (congestive) heart failure: Secondary | ICD-10-CM

## 2018-12-30 ENCOUNTER — Encounter (HOSPITAL_COMMUNITY): Payer: Medicare Other

## 2018-12-30 ENCOUNTER — Encounter (HOSPITAL_COMMUNITY)
Admission: RE | Admit: 2018-12-30 | Discharge: 2018-12-30 | Disposition: A | Discharge status: Home / Self Care | Payer: Medicare Other | Source: Ambulatory Visit | Attending: Cardiology | Admitting: Cardiology

## 2018-12-30 ENCOUNTER — Other Ambulatory Visit: Payer: Self-pay

## 2018-12-30 DIAGNOSIS — I5022 Chronic systolic (congestive) heart failure: Secondary | ICD-10-CM

## 2019-01-02 ENCOUNTER — Encounter (HOSPITAL_COMMUNITY): Payer: Medicare Other

## 2019-01-02 ENCOUNTER — Other Ambulatory Visit: Payer: Self-pay

## 2019-01-02 ENCOUNTER — Encounter (HOSPITAL_COMMUNITY)
Admission: RE | Admit: 2019-01-02 | Discharge: 2019-01-02 | Disposition: A | Discharge status: Home / Self Care | Payer: Medicare Other | Source: Ambulatory Visit | Attending: Cardiology | Admitting: Cardiology

## 2019-01-02 DIAGNOSIS — I5022 Chronic systolic (congestive) heart failure: Secondary | ICD-10-CM | POA: Diagnosis not present

## 2019-01-04 ENCOUNTER — Other Ambulatory Visit: Payer: Self-pay

## 2019-01-04 ENCOUNTER — Encounter (HOSPITAL_COMMUNITY): Payer: Medicare Other

## 2019-01-04 ENCOUNTER — Encounter (HOSPITAL_COMMUNITY)
Admission: RE | Admit: 2019-01-04 | Discharge: 2019-01-04 | Disposition: A | Discharge status: Home / Self Care | Payer: Medicare Other | Source: Ambulatory Visit | Attending: Cardiology | Admitting: Cardiology

## 2019-01-04 DIAGNOSIS — I5022 Chronic systolic (congestive) heart failure: Secondary | ICD-10-CM | POA: Diagnosis not present

## 2019-01-06 ENCOUNTER — Encounter (HOSPITAL_COMMUNITY)
Admission: RE | Admit: 2019-01-06 | Discharge: 2019-01-06 | Disposition: A | Discharge status: Home / Self Care | Payer: Medicare Other | Source: Ambulatory Visit | Attending: Cardiology | Admitting: Cardiology

## 2019-01-06 ENCOUNTER — Encounter (HOSPITAL_COMMUNITY): Payer: Medicare Other

## 2019-01-06 ENCOUNTER — Other Ambulatory Visit: Payer: Self-pay

## 2019-01-06 DIAGNOSIS — I5022 Chronic systolic (congestive) heart failure: Secondary | ICD-10-CM | POA: Diagnosis not present

## 2019-01-09 ENCOUNTER — Encounter (HOSPITAL_COMMUNITY): Payer: Medicare Other

## 2019-01-09 ENCOUNTER — Encounter (HOSPITAL_COMMUNITY)
Admission: RE | Admit: 2019-01-09 | Discharge: 2019-01-09 | Disposition: A | Discharge status: Home / Self Care | Payer: Medicare Other | Source: Ambulatory Visit | Attending: Cardiology | Admitting: Cardiology

## 2019-01-09 ENCOUNTER — Other Ambulatory Visit: Payer: Self-pay

## 2019-01-09 DIAGNOSIS — I5022 Chronic systolic (congestive) heart failure: Secondary | ICD-10-CM | POA: Diagnosis not present

## 2019-01-10 ENCOUNTER — Other Ambulatory Visit (HOSPITAL_COMMUNITY): Payer: Self-pay | Admitting: Cardiology

## 2019-01-11 ENCOUNTER — Encounter (HOSPITAL_COMMUNITY)
Admission: RE | Admit: 2019-01-11 | Discharge: 2019-01-11 | Disposition: A | Discharge status: Home / Self Care | Payer: Medicare Other | Source: Ambulatory Visit | Attending: Cardiology | Admitting: Cardiology

## 2019-01-11 ENCOUNTER — Encounter (HOSPITAL_COMMUNITY): Payer: Medicare Other

## 2019-01-11 ENCOUNTER — Other Ambulatory Visit: Payer: Self-pay

## 2019-01-11 DIAGNOSIS — I428 Other cardiomyopathies: Secondary | ICD-10-CM | POA: Diagnosis not present

## 2019-01-11 DIAGNOSIS — I5022 Chronic systolic (congestive) heart failure: Secondary | ICD-10-CM

## 2019-01-11 DIAGNOSIS — I509 Heart failure, unspecified: Secondary | ICD-10-CM | POA: Diagnosis not present

## 2019-01-11 MED ORDER — TECHNETIUM TC 99M PYROPHOSPHATE
21.0000 | Freq: Once | INTRAVENOUS | Status: AC | PRN
  Administered 2019-01-11: 21 via INTRAVENOUS
  Filled 2019-01-11: qty 21

## 2019-01-12 ENCOUNTER — Telehealth: Payer: Self-pay | Admitting: Family Medicine

## 2019-01-12 NOTE — Telephone Encounter (Signed)
See below, Im not showing where you contacted personally.

## 2019-01-12 NOTE — Telephone Encounter (Signed)
Copied from [place] [id number]. Topic: General - Inquiry >> Jan 12, 2019  4:41 PM Mathis Bud wrote: Reason for CRM: patient is requesting a call back from PCP regarding last conversation. Call back [phone]

## 2019-01-12 NOTE — Telephone Encounter (Signed)
Please call patient to get clarification on what his questions are

## 2019-01-13 ENCOUNTER — Encounter (HOSPITAL_COMMUNITY): Payer: Medicare Other

## 2019-01-13 ENCOUNTER — Encounter (HOSPITAL_COMMUNITY)
Admission: RE | Admit: 2019-01-13 | Discharge: 2019-01-13 | Disposition: A | Discharge status: Home / Self Care | Payer: Medicare Other | Source: Ambulatory Visit | Attending: Cardiology | Admitting: Cardiology

## 2019-01-13 ENCOUNTER — Other Ambulatory Visit: Payer: Self-pay

## 2019-01-13 DIAGNOSIS — R21 Rash and other nonspecific skin eruption: Secondary | ICD-10-CM

## 2019-01-13 DIAGNOSIS — I5022 Chronic systolic (congestive) heart failure: Secondary | ICD-10-CM | POA: Diagnosis not present

## 2019-01-13 DIAGNOSIS — L609 Nail disorder, unspecified: Secondary | ICD-10-CM

## 2019-01-13 NOTE — Telephone Encounter (Signed)
Pt wants to know if he can start taking famotidine everyday vs prn as he is having reflux flare ups.  He states you gave this Rx to him the last time he saw you but only wanted him to take PRN.

## 2019-01-14 NOTE — Telephone Encounter (Signed)
Yes thanks-can schedule Pepcid twice a day for the next 2 weeks to see if this comes back down.  Can trial off if that is effective and if recurs can restart regular use

## 2019-01-16 ENCOUNTER — Encounter (HOSPITAL_COMMUNITY): Payer: Medicare Other

## 2019-01-16 ENCOUNTER — Encounter (HOSPITAL_COMMUNITY)
Admission: RE | Admit: 2019-01-16 | Discharge: 2019-01-16 | Disposition: A | Discharge status: Home / Self Care | Payer: Medicare Other | Source: Ambulatory Visit | Attending: Cardiology | Admitting: Cardiology

## 2019-01-16 ENCOUNTER — Other Ambulatory Visit: Payer: Self-pay

## 2019-01-16 DIAGNOSIS — I5022 Chronic systolic (congestive) heart failure: Secondary | ICD-10-CM

## 2019-01-16 NOTE — Telephone Encounter (Signed)
Called and lm on pt vm with below info.

## 2019-01-16 NOTE — Progress Notes (Signed)
Cardiac Individual Treatment Plan  Patient Details  Name: [name] MRN: [id number] Date of Birth: December 14, 1929 Referring Provider:     [facility] from 11/29/2018 in [place]  Referring Provider  Dr. Aundra Dubin       Initial Encounter Date:    CARDIAC REHAB PHASE II ORIENTATION from 11/29/2018 in [place]  Date  11/29/18      Visit Diagnosis: Heart failure, chronic systolic ([place])  Patient's Home Medications on Admission:  Current Outpatient Medications:    acetaminophen (TYLENOL) 500 MG tablet, Take 1,000 mg by mouth every 6 (six) hours as needed for moderate pain or headache., Disp: , Rfl:    dutasteride (AVODART) 0.5 MG capsule, Take 0.5 mg by mouth daily. , Disp: , Rfl:    ELIQUIS 5 MG TABS tablet, TAKE 1 TABLET BY MOUTH TWICE A DAY, Disp: 60 tablet, Rfl: 0   fluticasone (FLONASE) 50 MCG/ACT nasal spray, Place 2 sprays into both nostrils daily as needed for allergies or rhinitis., Disp: 16 g, Rfl: 3   furosemide (LASIX) 80 MG tablet, Take 67m in the AM and 455min the PM, Disp: 135 tablet, Rfl: 3   hydroxypropyl methylcellulose / hypromellose (ISOPTO TEARS / GONIOVISC) 2.5 % ophthalmic solution, Place 1 drop into both eyes 3 (three) times daily as needed for dry eyes. , Disp: , Rfl:    losartan (COZAAR) 25 MG tablet, Take 0.5 tablets (12.5 mg total) by mouth daily., Disp: 45 tablet, Rfl: 3   metoprolol succinate (TOPROL-XL) 25 MG 24 hr tablet, Take 1 tablet (25 mg total) by mouth daily., Disp: 90 tablet, Rfl: 2   rosuvastatin (CRESTOR) 10 MG tablet, Take 1 tablet (10 mg total) by mouth at bedtime., Disp: 90 tablet, Rfl: 3   spironolactone (ALDACTONE) 25 MG tablet, Take 1 tablet (25 mg total) by mouth daily. (Patient taking differently: Take 12.5 mg by mouth daily. ), Disp: 90 tablet, Rfl: 3   tamsulosin (FLOMAX) 0.4 MG CAPS capsule, Take 0.4 mg by mouth 2 (two) times daily. ,  Disp: , Rfl:    triamcinolone cream (KENALOG) 0.1 %, Apply 1 application topically 2 (two) times daily. For 7-10 days maximum, Disp: 28.4 g, Rfl: 0   Wheat Dextrin (BENEFIBER DRINK MIX PO), Take by mouth. 2 tsps daily, Disp: , Rfl:   Past Medical History: Past Medical History:  Diagnosis Date   Arthritis    "back" (03/20/2014)   Atrial fibrillation (HCC)    BPH (benign prostatic hypertrophy)    Breast mass    "on both sides" (06/28/2012)   Cardiomyopathy primary-nonischemic EF 45%   Carotid artery occlusion    CHF (congestive heart failure) (HCC)    Coronary artery disease    Diverticulosis of colon without hemorrhage 01/03/2014   Dysphagia    Elevated liver enzymes    Esophageal stricture    GERD (gastroesophageal reflux disease)    Hearing loss, mixed, bilateral    Hiatal hernia    Hx of cardiovascular stress test    Adenosine Myoview (07/2013):  Apical cap, apical lateral and mid anterolateral scar, small amount of peri-infarct ischemia, EF 36%; Medium Risk   Hyperlipidemia    Increased prostate specific antigen (PSA) velocity    Internal hemorrhoids 01/03/2014   Lumbar back pain    Pacemaker    st jude   Pacemaker infection (HCEunice   06/28/12   Polymyalgia rheumatica ([facility]   Sick sinus syndrome (  [place])     Tobacco Use: Social History   Tobacco Use  Smoking Status Former Smoker   Packs/day: 1.00   Years: 40.00   Pack years: 40.00   Types: Cigarettes   Quit date: 03/10/1983   Years since quitting: 35.8  Smokeless Tobacco Never Used    Labs: Recent Chemical engineer    Labs for ITP Cardiac and Pulmonary Rehab Latest Ref Rng & Units 06/15/2017 07/24/2017 07/25/2017 02/17/2018 03/04/2018   Cholestrol 0 - 200 mg/dL - 130 127 118 116   LDLCALC 0 - 99 mg/dL - 76 72 66 63   LDLDIRECT mg/dL 69.0 - - - -   HDL >40 mg/dL - 45 41 44 41   Trlycerides <150 mg/dL - 47 69 38 61   Hemoglobin A1c 4.8 - 5.6 % - 5.6 5.6 - -      Capillary Blood  Glucose: Lab Results  Component Value Date   GLUCAP 116 (H) 07/07/2012     Exercise Target Goals: Exercise Program Goal: Individual exercise prescription set using results from initial 6 min walk test and THRR while considering  patients activity barriers and safety.   Exercise Prescription Goal: Starting with aerobic activity 30 plus minutes a day, 3 days per week for initial exercise prescription. Provide home exercise prescription and guidelines that participant acknowledges understanding prior to discharge.  Activity Barriers & Risk Stratification: Activity Barriers & Cardiac Risk Stratification - 11/29/18 1512      Activity Barriers & Cardiac Risk Stratification   Activity Barriers  Deconditioning;Muscular Weakness;Shortness of Breath   Neuropathy   Cardiac Risk Stratification  High       6 Minute Walk: 6 Minute Walk    Row Name 11/29/18 1511         6 Minute Walk   Phase  Initial     Distance  1207 feet     Walk Time  6 minutes     # of Rest Breaks  0     MPH  2.2     METS  1.8     RPE  13     Perceived Dyspnea   0     VO2 Peak  6.37     Symptoms  No     Resting HR  95 bpm     Resting BP  102/52     Resting Oxygen Saturation   98 %     Exercise Oxygen Saturation  during 6 min walk  99 %     Max Ex. HR  105 bpm     Max Ex. BP  112/64     2 Minute Post BP  102/62        Oxygen Initial Assessment:   Oxygen Re-Evaluation:   Oxygen Discharge (Final Oxygen Re-Evaluation):   Initial Exercise Prescription: Initial Exercise Prescription - 11/29/18 1500      Date of Initial Exercise RX and Referring Provider   Date  11/29/18    Referring Provider  Dr. Aundra Dubin     Expected Discharge Date  01/27/19      Recumbant Bike   Level  2    Watts  5    Minutes  15    METs  2.22      Arm Ergometer   Level  2    Watts  15    Minutes  15    METs  2.12      Prescription Details   Frequency (times per week)  3  Duration  Progress to 30 minutes of  continuous aerobic without signs/symptoms of physical distress      Intensity   THRR 40-80% of Max Heartrate  53-106    Ratings of Perceived Exertion  11-13      Progression   Progression  Continue to progress workloads to maintain intensity without signs/symptoms of physical distress.      Resistance Training   Training Prescription  Yes    Weight  4 lbs.     Reps  10-15       Perform Capillary Blood Glucose checks as needed.  Exercise Prescription Changes:  Exercise Prescription Changes    Row Name 12/07/18 1500 12/16/18 1000 12/19/18 0845 01/09/19 0845       Response to Exercise   Blood Pressure (Admit)  90/58  98/52  90/50  108/62    Blood Pressure (Exercise)  110/62  104/62  116/74  110/64    Blood Pressure (Exit)  101/62  96/60  95/57  96/62    Heart Rate (Admit)  80 bpm  97 bpm  88 bpm  72 bpm    Heart Rate (Exercise)  105 bpm  107 bpm  105 bpm  105 bpm    Heart Rate (Exit)  86 bpm  96 bpm  68 bpm  74 bpm    Rating of Perceived Exertion (Exercise)  '14  12  13  15    ' Symptoms  None  None  None  None    Comments  Pt tolerated exercise well first day.   --  --  --    Duration  Continue with 30 min of aerobic exercise without signs/symptoms of physical distress.  Continue with 30 min of aerobic exercise without signs/symptoms of physical distress.  Continue with 30 min of aerobic exercise without signs/symptoms of physical distress.  Continue with 30 min of aerobic exercise without signs/symptoms of physical distress.    Intensity  THRR unchanged  THRR unchanged  THRR unchanged  THRR unchanged      Progression   Progression  Continue to progress workloads to maintain intensity without signs/symptoms of physical distress.  Continue to progress workloads to maintain intensity without signs/symptoms of physical distress.  Continue to progress workloads to maintain intensity without signs/symptoms of physical distress.  Continue to progress workloads to maintain intensity without  signs/symptoms of physical distress.    Average METs  2.4  2.4  3.2  3.1      Resistance Training   Training Prescription  --  Yes  Yes  Yes    Weight  --  4 lbs.   4 lbs.   5 lbs.     Reps  --  10-15  10-15  10-15    Time  --  10 Minutes  10 Minutes  10 Minutes      Interval Training   Interval Training  --  --  No  No      Recumbant Bike   Level  '2  3  4  ' 4.5    Watts  5  5  --  --    Minutes  '15  15  15  15    ' METs  2  2  4.8  4.2      Arm Ergometer   Level  '2  3  3  5    ' Watts  '15  15  10  15    ' Minutes  '15  15  15  ' 15  METs  2.87  2.87  1.75  2.12      Home Exercise Plan   Plans to continue exercise at  --  Home (comment)  Home (comment)  Home (comment)    Frequency  --  Add 3 additional days to program exercise sessions.  Add 3 additional days to program exercise sessions.  Add 3 additional days to program exercise sessions.    Initial Home Exercises Provided  --  12/16/18  12/16/18  12/16/18       Exercise Comments:  Exercise Comments    Row Name 12/07/18 1506 12/16/18 1040 12/20/18 1137 01/19/19 0803     Exercise Comments  Pt first day of CR program. Pt tolerated exercise Rx well.  Reviewed HEP with Pt. Pt understands goals.  Reviewed METs and goals with Pt. Pt is progressing well.  Reviewed METs and goals with Pt. Pt is progressing well.       Exercise Goals and Review:  Exercise Goals    Row Name 11/29/18 1514             Exercise Goals   Increase Physical Activity  Yes       Intervention  Provide advice, education, support and counseling about physical activity/exercise needs.;Develop an individualized exercise prescription for aerobic and resistive training based on initial evaluation findings, risk stratification, comorbidities and participant's personal goals.       Expected Outcomes  Short Term: Attend rehab on a regular basis to increase amount of physical activity.;Long Term: Add in home exercise to make exercise part of routine and to increase  amount of physical activity.;Long Term: Exercising regularly at least 3-5 days a week.       Increase Strength and Stamina  Yes       Intervention  Provide advice, education, support and counseling about physical activity/exercise needs.;Develop an individualized exercise prescription for aerobic and resistive training based on initial evaluation findings, risk stratification, comorbidities and participant's personal goals.       Expected Outcomes  Short Term: Increase workloads from initial exercise prescription for resistance, speed, and METs.;Short Term: Perform resistance training exercises routinely during rehab and add in resistance training at home;Long Term: Improve cardiorespiratory fitness, muscular endurance and strength as measured by increased METs and functional capacity (6MWT)       Able to understand and use rate of perceived exertion (RPE) scale  Yes       Intervention  Provide education and explanation on how to use RPE scale       Expected Outcomes  Short Term: Able to use RPE daily in rehab to express subjective intensity level;Long Term:  Able to use RPE to guide intensity level when exercising independently       Knowledge and understanding of Target Heart Rate Range (THRR)  Yes       Intervention  Provide education and explanation of THRR including how the numbers were predicted and where they are located for reference       Expected Outcomes  Short Term: Able to state/look up THRR;Long Term: Able to use THRR to govern intensity when exercising independently;Short Term: Able to use daily as guideline for intensity in rehab       Able to check pulse independently  Yes       Intervention  Provide education and demonstration on how to check pulse in carotid and radial arteries.;Review the importance of being able to check your own pulse for safety during independent exercise  Expected Outcomes  Short Term: Able to explain why pulse checking is important during independent  exercise;Long Term: Able to check pulse independently and accurately       Understanding of Exercise Prescription  Yes       Intervention  Provide education, explanation, and written materials on patient's individual exercise prescription       Expected Outcomes  Short Term: Able to explain program exercise prescription;Long Term: Able to explain home exercise prescription to exercise independently          Exercise Goals Re-Evaluation : Exercise Goals Re-Evaluation    Row Name 12/07/18 1503 12/16/18 1039 12/20/18 1135 01/18/19 0845       Exercise Goal Re-Evaluation   Exercise Goals Review  Increase Physical Activity;Increase Strength and Stamina;Able to understand and use rate of perceived exertion (RPE) scale;Knowledge and understanding of Target Heart Rate Range (THRR);Understanding of Exercise Prescription  Increase Physical Activity;Increase Strength and Stamina;Able to understand and use rate of perceived exertion (RPE) scale;Knowledge and understanding of Target Heart Rate Range (THRR);Able to check pulse independently;Understanding of Exercise Prescription  Increase Physical Activity;Increase Strength and Stamina;Able to understand and use rate of perceived exertion (RPE) scale;Knowledge and understanding of Target Heart Rate Range (THRR);Able to check pulse independently;Understanding of Exercise Prescription  Increase Physical Activity;Increase Strength and Stamina;Able to understand and use rate of perceived exertion (RPE) scale;Able to check pulse independently;Knowledge and understanding of Target Heart Rate Range (THRR);Understanding of Exercise Prescription    Comments  Pt tolerated first day of CR program well. Pt understands THRR, RPE scale, and exercise Rx.  Reviewed HEP with Pt. Pt understands goals, THRR, RPE scale, weather precautions, end points of exercise, warm up and cool down stretches, and pulse counting. Pt is currently taking water aerobics 2-3 days per week in addition to  CR program.  Reviewed METs and goals wiht Pt. Pt is progressing well and has a MET level of 3.2. Pt continues to increase workloads. Pt understands home exercise goals and is currently taking water aerobics 2-3 days per week in addition to CR program.  Reviewed METs and goals with Pt. Pt is pwalking up starogressing well and has a MET level of 3.1. Pt stated wlaking up stairs and on an incline are easier than when he first started due to the strength he has built while in CR program. Pt continues to exercsie on his own in addition to CR program by taking water aerobics 2-3 days per week.    Expected Outcomes  Will continue to monitor and progress pt as tolerated.  Will continue to monitor and progress Pt as tolerated.  Will continue to monitor and progress Pt as tolerated.  Will continue to monitor and progress Pt as tolerated.        Discharge Exercise Prescription (Final Exercise Prescription Changes): Exercise Prescription Changes - 01/09/19 0845      Response to Exercise   Blood Pressure (Admit)  108/62    Blood Pressure (Exercise)  110/64    Blood Pressure (Exit)  96/62    Heart Rate (Admit)  72 bpm    Heart Rate (Exercise)  105 bpm    Heart Rate (Exit)  74 bpm    Rating of Perceived Exertion (Exercise)  15    Symptoms  None    Duration  Continue with 30 min of aerobic exercise without signs/symptoms of physical distress.    Intensity  THRR unchanged      Progression   Progression  Continue to progress workloads  to maintain intensity without signs/symptoms of physical distress.    Average METs  3.1      Resistance Training   Training Prescription  Yes    Weight  5 lbs.     Reps  10-15    Time  10 Minutes      Interval Training   Interval Training  No      Recumbant Bike   Level  4.5    Minutes  15    METs  4.2      Arm Ergometer   Level  5    Watts  15    Minutes  15    METs  2.12      Home Exercise Plan   Plans to continue exercise at  Home (comment)    Frequency   Add 3 additional days to program exercise sessions.    Initial Home Exercises Provided  12/16/18       Nutrition:  Target Goals: Understanding of nutrition guidelines, daily intake of sodium <1565m, cholesterol <204m calories 30% from fat and 7% or less from saturated fats, daily to have 5 or more servings of fruits and vegetables.  Biometrics: Pre Biometrics - 11/29/18 1514      Pre Biometrics   Height  5' 5.75" (1.67 m)    Weight  70.1 kg    Waist Circumference  40 inches    Hip Circumference  38 inches    Waist to Hip Ratio  1.05 %    BMI (Calculated)  25.14    Triceps Skinfold  12 mm    % Body Fat  26.6 %    Grip Strength  26 kg    Flexibility  0 in    Single Leg Stand  1.12 seconds        Nutrition Therapy Plan and Nutrition Goals:   Nutrition Assessments:   Nutrition Goals Re-Evaluation:   Nutrition Goals Discharge (Final Nutrition Goals Re-Evaluation):   Psychosocial: Target Goals: Acknowledge presence or absence of significant depression and/or stress, maximize coping skills, provide positive support system. Participant is able to verbalize types and ability to use techniques and skills needed for reducing stress and depression.  Initial Review & Psychosocial Screening: Initial Psych Review & Screening - 11/29/18 1410      Initial Review   Current issues with  Current Stress Concerns    Source of Stress Concerns  --   political climate     Family Dynamics   Good Support System?  Yes    Comments  [name] psychosocial barriers to participation in CR and self health management. He does admit to mild stress and worry secondary to election year and political climate [place] states he has a very strong support system and a positive outlook on his future. He is very satisfied with his life.      Barriers   Psychosocial barriers to participate in program  There are no identifiable barriers or psychosocial needs.      Screening Interventions    Interventions  Encouraged to exercise       Quality of Life Scores: Quality of Life - 11/29/18 1515      Quality of Life   Select  Quality of Life      Quality of Life Scores   Health/Function Pre  25.07 %    Socioeconomic Pre  27.14 %    Psych/Spiritual Pre  27.86 %    Family Pre  28.8 %  GLOBAL Pre  26.62 %      Scores of 19 and below usually indicate a poorer quality of life in these areas.  A difference of  2-3 points is a clinically meaningful difference.  A difference of 2-3 points in the total score of the Quality of Life Index has been associated with significant improvement in overall quality of life, self-image, physical symptoms, and general health in studies assessing change in quality of life.  PHQ-9: Recent Review Flowsheet Data    Depression screen [facility] 2/9 11/29/2018 06/03/2018 09/20/2017 08/25/2016   Decreased Interest 0 0 0 0   Down, Depressed, Hopeless 1  0 0 0   PHQ - 2 Score 1 0 0 0     Interpretation of Total Score  Total Score Depression Severity:  1-4 = Minimal depression, 5-9 = Mild depression, 10-14 = Moderate depression, 15-19 = Moderately severe depression, 20-27 = Severe depression   Psychosocial Evaluation and Intervention: Psychosocial Evaluation - 12/07/18 1601      Psychosocial Evaluation & Interventions   Interventions  Encouraged to exercise with the program and follow exercise prescription    Comments  Patient states he is feeling mild stress related to political climate however this is not a barrier to his participation in Cardiac Rehab or health management. He enjoys playing bridge, reading, and watching TV and utilizes these activities for healthy stress management.    Expected Outcomes  Patient will continue to have a positive attitude and utilize healthy stress management stratagies to deal with the mild stress he is experiencing secondary to political climate. He has a strong support system and will utilize for support if psychosocial  needs arise.    Continue Psychosocial Services   No Follow up required       Psychosocial Re-Evaluation: Psychosocial Re-Evaluation    Tracy Name 12/16/18 1213 12/16/18 1215 12/20/18 1542 01/13/19 1627       Psychosocial Re-Evaluation   Current issues with  None Identified  --  None Identified  None Identified    Comments  [name] denies psychosocial barriers to participation in CR or self health management. He continues to have a positive outlook and acknolodges a strong support system of family, friends, and healthcare providers.  --  [name] denies psychosocial barriers to participation in CR or self health management. He continues to have a positive outlook and acknolodges a strong support system of family, friends, and healthcare providers.  [name] denies psychosocial barriers to participation in CR or self health management. He continues to have a positive outlook and acknolodges a strong support system of family, friends, and healthcare providers.    Expected Outcomes  --  [name] continue to utilize positive coping skills and healthy stress management for any psychosocial barriers that may arise.  [name] continue to utilize positive coping skills and healthy stress management for any psychosocial barriers that may arise.  [name] continue to utilize positive coping skills and healthy stress management for any psychosocial barriers that may arise.    Continue Psychosocial Services   --  --  No Follow up required  No Follow up required       Psychosocial Discharge (Final Psychosocial Re-Evaluation): Psychosocial Re-Evaluation - 01/13/19 1627      Psychosocial Re-Evaluation   Current issues with  None Identified    Comments  [name] denies psychosocial barriers to participation in CR or self health management. He continues to have a positive outlook and  acknolodges a strong support system of family, friends, and healthcare  providers.    Expected Outcomes  [name] continue to utilize positive coping skills and healthy stress management for any psychosocial barriers that may arise.    Continue Psychosocial Services   No Follow up required       Vocational Rehabilitation: Provide vocational rehab assistance to qualifying candidates.   Vocational Rehab Evaluation & Intervention:   Education: Education Goals: Education classes will be provided on a weekly basis, covering required topics. Participant will state understanding/return demonstration of topics presented.  Learning Barriers/Preferences: Learning Barriers/Preferences - 11/29/18 1515      Learning Barriers/Preferences   Learning Barriers  Sight;Hearing    Learning Preferences  Audio;Verbal Instruction       Education Topics: Hypertension, Hypertension Reduction -Define heart disease and high blood pressure. Discus how high blood pressure affects the body and ways to reduce high blood pressure.   Exercise and Your Heart -Discuss why it is important to exercise, the FITT principles of exercise, normal and abnormal responses to exercise, and how to exercise safely.   Angina -Discuss definition of angina, causes of angina, treatment of angina, and how to decrease risk of having angina.   Cardiac Medications -Review what the following cardiac medications are used for, how they affect the body, and side effects that may occur when taking the medications.  Medications include Aspirin, Beta blockers, calcium channel blockers, ACE Inhibitors, angiotensin receptor blockers, diuretics, digoxin, and antihyperlipidemics.   Congestive Heart Failure -Discuss the definition of CHF, how to live with CHF, the signs and symptoms of CHF, and how keep track of weight and sodium intake.   Heart Disease and Intimacy -Discus the effect sexual activity has on the heart, how changes occur during intimacy as we age, and safety during sexual  activity.   Smoking Cessation / COPD -Discuss different methods to quit smoking, the health benefits of quitting smoking, and the definition of COPD.   Nutrition I: Fats -Discuss the types of cholesterol, what cholesterol does to the heart, and how cholesterol levels can be controlled.   Nutrition II: Labels -Discuss the different components of food labels and how to read food label   Heart Parts/Heart Disease and PAD -Discuss the anatomy of the heart, the pathway of blood circulation through the heart, and these are affected by heart disease.   Stress I: Signs and Symptoms -Discuss the causes of stress, how stress may lead to anxiety and depression, and ways to limit stress.   Stress II: Relaxation -Discuss different types of relaxation techniques to limit stress.   Warning Signs of Stroke / TIA -Discuss definition of a stroke, what the signs and symptoms are of a stroke, and how to identify when someone is having stroke.   Knowledge Questionnaire Score: Knowledge Questionnaire Score - 11/29/18 1516      Knowledge Questionnaire Score   Pre Score  21/24       Core Components/Risk Factors/Patient Goals at Admission: Personal Goals and Risk Factors at Admission - 11/29/18 1516      Core Components/Risk Factors/Patient Goals on Admission    Weight Management  Yes;Weight Maintenance    Intervention  Weight Management: Develop a combined nutrition and exercise program designed to reach desired caloric intake, while maintaining appropriate intake of nutrient and fiber, sodium and fats, and appropriate energy expenditure required for the weight goal.;Weight Management: Provide education and appropriate resources to help participant work on and attain dietary goals.  Admit Weight  154 lb 8.7 oz (70.1 kg)    Expected Outcomes  Short Term: Continue to assess and modify interventions until short term weight is achieved;Long Term: Adherence to nutrition and physical  activity/exercise program aimed toward attainment of established weight goal;Weight Maintenance: Understanding of the daily nutrition guidelines, which includes 25-35% calories from fat, 7% or less cal from saturated fats, less than 267m cholesterol, less than 1.5gm of sodium, & 5 or more servings of fruits and vegetables daily;Understanding recommendations for meals to include 15-35% energy as protein, 25-35% energy from fat, 35-60% energy from carbohydrates, less than 2018mof dietary cholesterol, 20-35 gm of total fiber daily;Understanding of distribution of calorie intake throughout the day with the consumption of 4-5 meals/snacks    Improve shortness of breath with ADL's  Yes    Intervention  Provide education, individualized exercise plan and daily activity instruction to help decrease symptoms of SOB with activities of daily living.    Expected Outcomes  Short Term: Improve cardiorespiratory fitness to achieve a reduction of symptoms when performing ADLs;Long Term: Be able to perform more ADLs without symptoms or delay the onset of symptoms    Hypertension  Yes    Intervention  Provide education on lifestyle modifcations including regular physical activity/exercise, weight management, moderate sodium restriction and increased consumption of fresh fruit, vegetables, and low fat dairy, alcohol moderation, and smoking cessation.;Monitor prescription use compliance.    Expected Outcomes  Short Term: Continued assessment and intervention until BP is < 140/9031mG in hypertensive participants. < 130/73m48m in hypertensive participants with diabetes, heart failure or chronic kidney disease.;Long Term: Maintenance of blood pressure at goal levels.    Lipids  Yes    Intervention  Provide education and support for participant on nutrition & aerobic/resistive exercise along with prescribed medications to achieve LDL <70mg49mL >40mg.4mExpected Outcomes  Short Term: Participant states understanding of  desired cholesterol values and is compliant with medications prescribed. Participant is following exercise prescription and nutrition guidelines.;Long Term: Cholesterol controlled with medications as prescribed, with individualized exercise RX and with personalized nutrition plan. Value goals: LDL < 70mg, 13m> 40 mg.       Core Components/Risk Factors/Patient Goals Review:  Goals and Risk Factor Review    Row Name 12/07/18 1607 12/16/18 1216 12/20/18 1542 01/13/19 1627       Core Components/Risk Factors/Patient Goals Review   Personal Goals Review  Weight Management/Obesity;Lipids;Improve shortness of breath with ADL's;Hypertension;Heart Failure  Weight Management/Obesity;Lipids;Improve shortness of breath with ADL's;Hypertension;Heart Failure  Weight Management/Obesity;Lipids;Improve shortness of breath with ADL's;Hypertension;Heart Failure  Weight Management/Obesity;Lipids;Improve shortness of breath with ADL's;Hypertension;Heart Failure    Review  Patient has multiple CAD risk factors. He is eager to participate in cardiac rehab.  Patient continues to have multiple CAD risk factors but feels his shortness of breath is improving with weekly participation in CR. He continues to take all medications as prescribed including medications for HTN, lipids and HF. His weight remains stable.  Cleofas Jamoneues to have multiple CAD risk factors but feels his shortness of breath is improving with weekly participation in CR. He continues to take all medications as prescribed including medications for HTN, lipids and HF. His weight remains stable.  Eileen Jerrylues to have multiple CAD risk factors but feels his shortness of breath is improving with weekly participation in CR. He continues to take all medications as prescribed including medications for HTN, lipids and HF. His weight remains stable.    Expected  Outcomes  Patient will continue to participate in CR to modify risk factors and improve his quality of  life secondary to heart failure. He would like to improve his shortnesss of breath and strengthen his legs. His ultimate goal is to maintain a healthy lifestyle.  Patient will continue to participate in CR to modify risk factors and improve his quality of life secondary to heart failure. He would like to improve his shortnesss of breath and strengthen his legs. His ultimate goal is to maintain a healthy lifestyle.  Patient will continue to participate in CR to modify risk factors and improve his quality of life secondary to heart failure. He would like to improve his shortnesss of breath and strengthen his legs. His ultimate goal is to maintain a healthy lifestyle.  Patient will continue to participate in CR to modify risk factors and improve his quality of life secondary to heart failure. He would like to improve his shortnesss of breath and strengthen his legs. His ultimate goal is to maintain a healthy lifestyle.       Core Components/Risk Factors/Patient Goals at Discharge (Final Review):  Goals and Risk Factor Review - 01/13/19 1627      Core Components/Risk Factors/Patient Goals Review   Personal Goals Review  Weight Management/Obesity;Lipids;Improve shortness of breath with ADL's;Hypertension;Heart Failure    Review  [name] continues to have multiple CAD risk factors but feels his shortness of breath is improving with weekly participation in CR. He continues to take all medications as prescribed including medications for HTN, lipids and HF. His weight remains stable.    Expected Outcomes  Patient will continue to participate in CR to modify risk factors and improve his quality of life secondary to heart failure. He would like to improve his shortnesss of breath and strengthen his legs. His ultimate goal is to maintain a healthy lifestyle.       ITP Comments: ITP Comments    Row Name 11/29/18 1428 12/16/18 1210 01/13/19 1633       ITP Comments  Medical Director- Dr. Fransico Him, MD  30 day  ITP review: [name] continue to do well in CR. He is tolerating workload increases and continues to deny cardiac symptoms. His bloodpressure remains on the lower side SBP 90s, but denies complaints. His blood pressure responds appropriately to exercise. He continues to exercise at home on days he does not attend CR. He denies any psychosocial barriers to participation.  30 day ITP review: [name] continue to do well in CR. He is tolerating workload increases and continues to deny cardiac symptoms. [name]'s blood pressures have improved on his recent sessions. His blood pressure responds appropriately to exercise. He continues to exercise at home on days he does not attend CR. He denies any psychosocial barriers to participation.        Comments: See ITP Comments.Barnet Pall, RN,BSN 01/19/2019 11:45 AM

## 2019-01-18 ENCOUNTER — Encounter (HOSPITAL_COMMUNITY): Payer: Medicare Other

## 2019-01-18 ENCOUNTER — Encounter (HOSPITAL_COMMUNITY)
Admission: RE | Admit: 2019-01-18 | Discharge: 2019-01-18 | Disposition: A | Discharge status: Home / Self Care | Payer: Medicare Other | Source: Ambulatory Visit | Attending: Cardiology | Admitting: Cardiology

## 2019-01-18 ENCOUNTER — Other Ambulatory Visit: Payer: Self-pay

## 2019-01-18 DIAGNOSIS — I5022 Chronic systolic (congestive) heart failure: Secondary | ICD-10-CM | POA: Diagnosis not present

## 2019-01-20 ENCOUNTER — Encounter (HOSPITAL_COMMUNITY): Payer: Medicare Other | Admitting: Cardiology

## 2019-01-20 ENCOUNTER — Encounter (HOSPITAL_COMMUNITY)
Admission: RE | Admit: 2019-01-20 | Discharge: 2019-01-20 | Disposition: A | Discharge status: Home / Self Care | Payer: Medicare Other | Source: Ambulatory Visit | Attending: Cardiology | Admitting: Cardiology

## 2019-01-20 ENCOUNTER — Other Ambulatory Visit: Payer: Self-pay

## 2019-01-20 ENCOUNTER — Encounter (HOSPITAL_COMMUNITY): Payer: Medicare Other

## 2019-01-20 VITALS — Ht 63.75 in | Wt 155.2 lb

## 2019-01-20 DIAGNOSIS — I5022 Chronic systolic (congestive) heart failure: Secondary | ICD-10-CM | POA: Diagnosis not present

## 2019-01-23 ENCOUNTER — Ambulatory Visit (INDEPENDENT_AMBULATORY_CARE_PROVIDER_SITE_OTHER): Payer: Medicare Other | Admitting: Podiatry

## 2019-01-23 ENCOUNTER — Encounter (HOSPITAL_COMMUNITY): Payer: Medicare Other

## 2019-01-23 ENCOUNTER — Other Ambulatory Visit: Payer: Self-pay

## 2019-01-23 ENCOUNTER — Encounter: Payer: Self-pay | Admitting: Podiatry

## 2019-01-23 VITALS — BP 89/46

## 2019-01-23 DIAGNOSIS — M79674 Pain in right toe(s): Secondary | ICD-10-CM | POA: Diagnosis not present

## 2019-01-23 DIAGNOSIS — L600 Ingrowing nail: Secondary | ICD-10-CM

## 2019-01-23 DIAGNOSIS — I6529 Occlusion and stenosis of unspecified carotid artery: Secondary | ICD-10-CM

## 2019-01-23 DIAGNOSIS — B351 Tinea unguium: Secondary | ICD-10-CM | POA: Diagnosis not present

## 2019-01-23 DIAGNOSIS — M79675 Pain in left toe(s): Secondary | ICD-10-CM | POA: Diagnosis not present

## 2019-01-25 ENCOUNTER — Encounter (HOSPITAL_COMMUNITY): Payer: Medicare Other

## 2019-01-25 NOTE — Progress Notes (Signed)
Subjective:   Patient ID: [name], male   DOB: 85 y.o.   MRN: [id number]   HPI Patient presents stating he has had a lot of problems with the nails of both feet and is developed more inflammation pain of the left big toenail and states he is concerned about chronic ingrown and what should be done for all his nails and this 1 specifically.  Patient does not smoke likes to be active   Review of Systems  All other systems reviewed and are negative.       Objective:  Physical Exam Vitals signs and nursing note reviewed.  Constitutional:      Appearance: He is well-developed.  Pulmonary:     Effort: Pulmonary effort is normal.  Musculoskeletal: Normal range of motion.  Skin:    General: Skin is warm.  Neurological:     Mental Status: He is alert.     Neurovascular status mildly diminished but intact with diminished muscle strength noted bilateral.  Range of motion subtalar midtarsal joint mildly diminished with patient noted to have thick yellow brittle nailbeds 1-5 both feet that are incurvated with mild looseness of the left hallux nail with excessive thickness of this nail itself.  Patient has good digital perfusion well oriented x3     Assessment:  Chronic mycotic nail infection 1-5 both feet with thickness brittle debris and pain along with acute condition left hallux     Plan:  H&P reviewed all conditions and today sterile debridement of all nailbeds accomplished with no iatrogenic bleeding and advised this patient on the continuation of conservative treatment.  Patient will be seen back to recheck

## 2019-01-26 ENCOUNTER — Encounter (HOSPITAL_COMMUNITY): Payer: Self-pay | Admitting: Cardiology

## 2019-01-26 ENCOUNTER — Other Ambulatory Visit: Payer: Self-pay

## 2019-01-26 ENCOUNTER — Ambulatory Visit (HOSPITAL_COMMUNITY)
Admission: RE | Admit: 2019-01-26 | Discharge: 2019-01-26 | Disposition: A | Discharge status: Home / Self Care | Payer: Medicare Other | Source: Ambulatory Visit | Attending: Cardiology | Admitting: Cardiology

## 2019-01-26 VITALS — BP 105/50 | HR 80 | Wt 153.8 lb

## 2019-01-26 DIAGNOSIS — Z8249 Family history of ischemic heart disease and other diseases of the circulatory system: Secondary | ICD-10-CM | POA: Diagnosis not present

## 2019-01-26 DIAGNOSIS — Z7901 Long term (current) use of anticoagulants: Secondary | ICD-10-CM | POA: Insufficient documentation

## 2019-01-26 DIAGNOSIS — I6521 Occlusion and stenosis of right carotid artery: Secondary | ICD-10-CM | POA: Diagnosis not present

## 2019-01-26 DIAGNOSIS — I442 Atrioventricular block, complete: Secondary | ICD-10-CM | POA: Diagnosis not present

## 2019-01-26 DIAGNOSIS — M353 Polymyalgia rheumatica: Secondary | ICD-10-CM | POA: Insufficient documentation

## 2019-01-26 DIAGNOSIS — N400 Enlarged prostate without lower urinary tract symptoms: Secondary | ICD-10-CM | POA: Insufficient documentation

## 2019-01-26 DIAGNOSIS — Z79899 Other long term (current) drug therapy: Secondary | ICD-10-CM | POA: Insufficient documentation

## 2019-01-26 DIAGNOSIS — I5022 Chronic systolic (congestive) heart failure: Secondary | ICD-10-CM | POA: Diagnosis not present

## 2019-01-26 DIAGNOSIS — Z950 Presence of cardiac pacemaker: Secondary | ICD-10-CM | POA: Diagnosis not present

## 2019-01-26 DIAGNOSIS — I2510 Atherosclerotic heart disease of native coronary artery without angina pectoris: Secondary | ICD-10-CM | POA: Insufficient documentation

## 2019-01-26 DIAGNOSIS — K219 Gastro-esophageal reflux disease without esophagitis: Secondary | ICD-10-CM | POA: Insufficient documentation

## 2019-01-26 DIAGNOSIS — E785 Hyperlipidemia, unspecified: Secondary | ICD-10-CM | POA: Diagnosis not present

## 2019-01-26 DIAGNOSIS — I480 Paroxysmal atrial fibrillation: Secondary | ICD-10-CM | POA: Diagnosis present

## 2019-01-26 DIAGNOSIS — Z87891 Personal history of nicotine dependence: Secondary | ICD-10-CM | POA: Insufficient documentation

## 2019-01-26 DIAGNOSIS — I4819 Other persistent atrial fibrillation: Secondary | ICD-10-CM | POA: Diagnosis not present

## 2019-01-26 LAB — BASIC METABOLIC PANEL
Anion gap: 10 (ref 5–15)
BUN: 23 mg/dL (ref 8–23)
CO2: 26 mmol/L (ref 22–32)
Calcium: 9 mg/dL (ref 8.9–10.3)
Chloride: 101 mmol/L (ref 98–111)
Creatinine, Ser: 1.01 mg/dL (ref 0.61–1.24)
GFR calc Af Amer: >60 mL/min (ref >60)
GFR calc non Af Amer: >60 mL/min (ref >60)
Glucose, Bld: 133 mg/dL — ABNORMAL HIGH (ref 70–99)
Potassium: 3.7 mmol/L (ref 3.5–5.1)
Sodium: 137 mmol/L (ref 135–145)

## 2019-01-26 LAB — LIPID PANEL
Cholesterol: 110 mg/dL (ref 0–200)
HDL: 42 mg/dL (ref >40)
LDL Cholesterol: 43 mg/dL (ref 0–99)
Total CHOL/HDL Ratio: 2.6 RATIO
Triglycerides: 125 mg/dL (ref <150)
VLDL: 25 mg/dL (ref 0–40)

## 2019-01-26 MED ORDER — METOPROLOL SUCCINATE ER 25 MG PO TB24: 25.0000 mg | ORAL_TABLET | Freq: Two times a day (BID) | ORAL | 3 refills | Status: DC

## 2019-01-26 NOTE — Patient Instructions (Signed)
Labs done today. We will contact you only if your labs are abnormal.  INCREASE Metoprolol 25mg (1 tab) by mouth two times daily.  Please continue all other medications as prescribed.  Your physician recommends that you schedule a follow-up appointment in: 3 months  At the [facility], you and your health needs are our priority. As part of our continuing mission to provide you with exceptional heart care, we have created designated Provider Care Teams. These Care Teams include your primary Cardiologist (physician) and Advanced Practice Providers (APPs- Physician Assistants and Nurse Practitioners) who all work together to provide you with the care you need, when you need it.   You may see any of the following providers on your designated Care Team at your next follow up: Marland Kitchen Dr Glori Bickers . Dr [name] . Darrick Grinder, NP . Lyda Jester, PA   Please be sure to bring in all your medications bottles to every appointment.

## 2019-01-26 NOTE — Progress Notes (Signed)
Advanced Heart Failure Clinic Note  Patient ID: [name], male   DOB: 08/18/1929, 85 y.o.   MRN: [id number] PCP: Dr. Yong Channel Cardiology: Dr. Gerarda Fraction [name] is a 85 y.o. male with history of paroxysmal atrial fibrillation, nonischemic cardiomyopathy, and complete heart block with St Jude CRT-P system presents for cardiology followup.  He was admitted in 4/14 with pacemaker pocket infection.  His pacemaker was removed and temporary-permanent device was placed.  Later, he had the temporary-permanent device removed and a new CRT-P device was placed. He developed dyspnea post-operatively and was found to have a large right-sided pleural effusion.  He was admitted and got a chest tube for drainage. He had followup with Dr. Servando Snare and it was decided that he would not need VATS.  He had been on amiodarone for maintenance of NSR.  This had been more successful than Tikosyn.   Around 3/15, he started developing increasing exertional dyspnea.  He was short of breath walking up a hill or incline.  He could still walk on the treadmill for exercise for 10-15 minutes and use the elliptical without much trouble on most days.  No orthopnea or PND.  No chest pain.  He saw Dr Leanne Chang and was started on Lasix three times a week due to concern for volume overload.  This did not seem to have helped much.  After that, he had PFTs done which showed normal spirometry but DLCO 50% predicted.  Therefore, amiodarone was stopped due to concern for lung toxicity.  He saw Richardson Dopp for a pre-operative evaluation prior to right inguinal hernia repair.  Given the exertional dyspnea, he was set up for adenosine Cardiolite in 5/15.  This showed EF 36% with a small area of primarily scar in the apex, apical lateral wall, and mid anterolateral wall.  He has not felt palpitations. Echo in 5/15 showed EF 40-45%, similar to prior study.   I had him see pulmonary for evaluation for amiodarone lung toxicity.  CT chest  looked ok, and he was not thought to have amiodarone lung toxicity.   At a prior appointment in 2016, [name] was noted to be back in atrial fibrillation.  He was more short of breath with exertion and fatigued.  He has historically tolerated atrial fibrillation poorly.  I did a TEE-guided DCCV in 5/16 back to NSR.  TEE showed EF 35-40%.  He did not immediately feel better like he has in the past post-DCCV, so Lasix was increased.    Recurrent symptomatic atrial fibrillation in 8/17 with TEE-guided DCCV to NSR.  TEE showed EF 25-30%.  Recurrent symptomatic atrial fibrillation with CHF exacerbation in 9/17.  He had repeat DCCV and Lasix was increased.   He was back in atrial fibrillation in 11/17 and appears to have been out of rhythm since then.  He was more short of breath in atrial fibrillation with a significant fall in BiV pacing percentage.  Lasix was significantly increased to try to cope with volume overload.  In 2/18, he had AV nodal ablation to promote BiV pacing.    Admitted 5/19 with right eye lower visual field loss, found to have severe right carotid stenosis. S/p R CEA on 07/26/17. Switched from coumadin to Eliquis. Volume was stable.  Echo in 5/19 showed EF 30-35%, mild AI.   Echo in 6/20 showed EF 30-35% with mildly decreased RV systolic function.  PYP scan 11/20 was not suggestive of ATTR amyloidosis.  CPX (9/20) showed moderate functional deficit  due to CHF.    Today, he presents for followup of CHF.  He just finished cardiac rehab, thinks it helped his stamina.  He is now working out at the [employer] 3 times/week.  He decreased spironolactone back down to 12.5 mg daily due to lightheadedness on 25 mg daily.  Mild dyspnea walking up an incline.  He does ok on flat ground generally.  No orthopnea/PND.  No chest pain. Weight is down 4 lbs.   Labs (5/19): LDL 72, HDL 41, K 4.2, creatinine 0.86 Labs (10/19): K 3.9, creatinine 0.96 Labs (12/19): K 4.4, creatinine 0.99, LDL 63,  HDl 41 Labs (3/20): K 4.4, creatinine 1.03 Labs (6/20): TSH normal, K 3.8, creatinine 0.97 Labs (9/20): Myeloma panel negative, urine immunofixation negative, K 3.7, creatinine 0.93  PMH: 1. Low back pain 2. Hyperlipidemia 3. GERD 4. BPH 5. Polymyalgia rheumatica 6. Atrial fibrillation: Failed Tikosyn in the past.  Paroxysmal, on amiodarone and warfarin.  DCCV in 4/13, 7/13, 10/13.  Has held NSR with amiodarone.  However, concern for amiodarone lung toxicity: PFTs (4/15) with FVC 90% predicted, FEV1 90%, ratio 97%, DLCO 50%.   Recurrence of atrial fibrillation in 5/16, TEE-guided DCCV back to NSR in 5/16.  - Recurrence of atrial fibrillation in 8/17. TEE-guided DCCV 11/07/15.  - Recurrence of atrial fibrillation 9/17.  DCCV 11/20/15 but back in atrial fibrillation by 11/26/15.  - Amiodarone stopped due to persistent atrial fibrillation.  - AV nodal ablation in 2/18 to promote BiV pacing.  7. Transaminitis: Mild, attributed to amiodarone.  8. Complete heart block s/p St Jude CRT-P device.  Patient developed PCM pocket infection in 4/14 and had his first system removed with placement of a temporary permanent PCM.  He later had CRT-P device replaced.  This was complicated by large right-sided hemorrhagic pleural effusion requiring chest tube.   9. Nonischemic cardiomyopathy: Echo (9/13) with EF 40-45%, diffuse HK, mild Craig.  Mild CAD only on prior LHC.  Echo (4/14) with EF 45-50%.  Adenosine Cardiolite (5/15) with EF 36% (visually appeared higher per report), small area of scar with peri-infarct ischemia in the apex, apical lateral wall, and mid anterolateral wall. Echo (5/15) with EF 40-45%, basal to mid inferolateral severe hypokinesis, basal to mid anterolateral hypokinesis, mildly dilated RV with mildly decreased RV systolic function, mild to moderate Craig.  TEE (5/16) with EF 35-40%, inferior and inferolateral severe hypokinesis, normal RV size with mildly decreased systolic function, moderate Craig, peak  RV-RA gradient 26 mmHg.  - TEE (8/17): EF 25-30%, mildly dilated RV with mildly decreased systolic function, moderate biatrial enlargement, moderate central Craig.  - AV nodal ablation 2/18.  - Echo (5/19): EF 30-35%, mild AI.  - Echo (6/20): EF 30-35%, mild LV dilation, mild RV enlargement with mildly decreased systolic function, mild Craig.  - CPX (9/20): Peak VO2 14.4, VE/VCO2 48, RER 1.16 => moderate HF limitation.  10. Large right pleural effusion requiring chest tube following removal and reimplantation of PCM. Most recent chest CT in 6/15 showed significant decrease in size of loculated pleural effusion since 5/14.   11. Right CEA 5/19.  12. Right central retinal artery occlusion in 5/19.  13. PYP scan (11/20): Grade 1, H/CL 1.2 => no evidence for ATTR amyloidosis.   SH: Married, prior smoker (quit 1985), no ETOH x years, retired, lives at [employer]  [place]: Father with MI at 22, mother with AAA.   Review of systems complete and found to be negative unless listed in HPI.  Current Outpatient Medications  Medication Sig Dispense Refill  . acetaminophen (TYLENOL) 500 MG tablet Take 1,000 mg by mouth every 6 (six) hours as needed for moderate pain or headache.    . dutasteride (AVODART) 0.5 MG capsule Take 0.5 mg by mouth daily.     Marland Kitchen ELIQUIS 5 MG TABS tablet TAKE 1 TABLET BY MOUTH TWICE A DAY 60 tablet 0  . famotidine (PEPCID) 20 MG tablet Take 20 mg by mouth 2 (two) times daily as needed for heartburn or indigestion.    . fluticasone (FLONASE) 50 MCG/ACT nasal spray Place 2 sprays into both nostrils daily as needed for allergies or rhinitis. 16 g 3  . furosemide (LASIX) 80 MG tablet Take 80mg  in the AM and 40mg  in the PM 135 tablet 3  . hydroxypropyl methylcellulose / hypromellose (ISOPTO TEARS / GONIOVISC) 2.5 % ophthalmic solution Place 1 drop into both eyes 3 (three) times daily as needed for dry eyes.     Marland Kitchen losartan (COZAAR) 25 MG tablet Take 0.5 tablets (12.5 mg total) by mouth daily. 45  tablet 3  . metoprolol succinate (TOPROL-XL) 25 MG 24 hr tablet Take 1 tablet (25 mg total) by mouth 2 (two) times daily. 180 tablet 3  . rosuvastatin (CRESTOR) 10 MG tablet Take 1 tablet (10 mg total) by mouth at bedtime. 90 tablet 3  . spironolactone (ALDACTONE) 25 MG tablet Take 25 mg by mouth daily.    . tamsulosin (FLOMAX) 0.4 MG CAPS capsule Take 0.4 mg by mouth 2 (two) times daily.     Marland Kitchen triamcinolone cream (KENALOG) 0.1 % Apply 1 application topically 2 (two) times daily. For 7-10 days maximum 28.4 g 0  . Wheat Dextrin (BENEFIBER DRINK MIX PO) Take by mouth. 2 tsps daily     No current facility-administered medications for this encounter.     BP (!) 105/50   Pulse 80   Wt 69.8 kg (153 lb 12.8 oz)   SpO2 99%   BMI 26.61 kg/m   Wt Readings from Last 3 Encounters:  01/26/19 69.8 kg (153 lb 12.8 oz)  01/20/19 70.4 kg (155 lb 3.3 oz)  11/30/18 68 kg (150 lb)   General: NAD Neck: No JVD, no thyromegaly or thyroid nodule.  Lungs: Clear to auscultation bilaterally with normal respiratory effort. CV: Nondisplaced PMI.  Heart regular S1/S2, no S3/S4, no murmur.  No peripheral edema.  No carotid bruit.  Normal pedal pulses.  Abdomen: Soft, nontender, no hepatosplenomegaly, no distention.  Skin: Intact without lesions or rashes.  Neurologic: Alert and oriented x 3.  Psych: Normal affect. Extremities: No clubbing or cyanosis.  HEENT: Normal.   Assessment/Plan: 1. Chronic systolic CHF:  Presumed nonischemic cardiomyopathy (prior cath with only mild CAD).  He had an adenosine Cardiolite in 5/15 with EF 36% and possible small area of apical and lateral scar with peri-infarct ischemia. He has St Jude CRT-P.  Echo in 6/20 showed EF 30-35%. CPX (9/20) with moderate HF limitation. NYHA class II, no volume overload. Stable overall.  Med titration limited by orthostatic symptoms.  - Continue Lasix 80 qam/40 qpm.  BMET today.  - I will try to increase Toprol XL to 25 mg bid.  This should not  have much affect on BP, hopefully he will tolerate.   - Continue spironolactone 12.5 daily, he did not tolerate increase to 25 mg daily.  - Continue losartan 12.5 mg daily.   2. Atrial fibrillation: Now persistent.  Had breakthrough with Tikosyn and now not  able to hold NSR on amiodarone. He is now off amiodarone.  He is status post AV nodal ablation with significant improvement in symptoms and BiV pacing percentage (94% today, suspect mildly low due to PVCs).  - Continue apixiban 5 mg BID.  3. Hyperlipidemia: History of nonobstructive CAD and recent CEA.    - Continue Crestor, check lipids today.  4. Rt CEA occlusion s/p R CEA 07/26/17: He had an ulcerated, 70% stenotic plaque in the right internal carotid. Suspect this is the source of embolism for CRAO.     - Continue statin.  - Followed at [facility].   Followup in 3 months  [name], MD 01/26/2019

## 2019-01-26 NOTE — Progress Notes (Signed)
Discharge Progress Report  Patient Details  Name: [name] MRN: [id number] Date of Birth: [birth year] Referring Provider:     [place] from 11/29/2018 in [place]  Referring Provider  Dr. Aundra Dubin        Number of Visits: 20  Reason for Discharge:  Patient reached a stable level of exercise. Patient independent in their exercise. Patient has met program and personal goals.  Smoking History:  Social History   Tobacco Use  Smoking Status Former Smoker  . Packs/day: 1.00  . Years: 40.00  . Pack years: 40.00  . Types: Cigarettes  . Quit date: 03/10/1983  . Years since quitting: 35.9  Smokeless Tobacco Never Used    Diagnosis:  Heart failure, chronic systolic (HCC)  ADL UCSD:   Initial Exercise Prescription: Initial Exercise Prescription - 11/29/18 1500      Date of Initial Exercise RX and Referring Provider   Date  11/29/18    Referring Provider  Dr. Aundra Dubin     Expected Discharge Date  01/27/19      Recumbant Bike   Level  2    Watts  5    Minutes  15    METs  2.22      Arm Ergometer   Level  2    Watts  15    Minutes  15    METs  2.12      Prescription Details   Frequency (times per week)  3    Duration  Progress to 30 minutes of continuous aerobic without signs/symptoms of physical distress      Intensity   THRR 40-80% of Max Heartrate  53-106    Ratings of Perceived Exertion  11-13      Progression   Progression  Continue to progress workloads to maintain intensity without signs/symptoms of physical distress.      Resistance Training   Training Prescription  Yes    Weight  4 lbs.     Reps  10-15       Discharge Exercise Prescription (Final Exercise Prescription Changes): Exercise Prescription Changes - 01/20/19 1000      Response to Exercise   Blood Pressure (Admit)  92/52    Blood Pressure (Exercise)  110/62    Blood Pressure (Exit)  98/58    Heart Rate (Admit)  78 bpm     Heart Rate (Exercise)  106 bpm    Heart Rate (Exit)  66 bpm    Rating of Perceived Exertion (Exercise)  13    Symptoms  None    Comments  Pt last day of exercise.     Duration  Continue with 30 min of aerobic exercise without signs/symptoms of physical distress.    Intensity  THRR unchanged      Progression   Progression  Continue to progress workloads to maintain intensity without signs/symptoms of physical distress.    Average METs  3.3      Resistance Training   Training Prescription  No      Recumbant Bike   Level  4.5    Minutes  15    METs  4.9      Arm Ergometer   Level  5.5    Minutes  15    METs  1.83      Home Exercise Plan   Plans to continue exercise at  Home (comment)    Frequency  Add 3 additional days to program exercise sessions.  Initial Home Exercises Provided  12/16/18       Functional Capacity: 6 Minute Walk    Row Name 11/29/18 1511 01/20/19 1034       6 Minute Walk   Phase  Initial  Discharge    Distance  1207 feet  1448 feet    Distance % Change  -  19.97 %    Distance Feet Change  -  241 ft    Walk Time  6 minutes  6 minutes    # of Rest Breaks  0  0    MPH  2.2  2.7    METS  1.8  2.09    RPE  13  13    Perceived Dyspnea   0  0    VO2 Peak  6.37  7.33    Symptoms  No  No    Resting HR  95 bpm  78 bpm    Resting BP  102/52  92/52    Resting Oxygen Saturation   98 %  -    Exercise Oxygen Saturation  during 6 min walk  99 %  -    Max Ex. HR  105 bpm  106 bpm    Max Ex. BP  112/64  110/62    2 Minute Post BP  102/62  98/58       Psychological, QOL, Others - Outcomes: PHQ 2/9: Depression screen [facility] 2/9 01/20/2019 11/29/2018 06/03/2018 09/20/2017 08/25/2016  Decreased Interest 0 0 0 0 0  Down, Depressed, Hopeless 1 1 0 0 0  PHQ - 2 Score 1 1 0 0 0  Some recent data might be hidden    Quality of Life: Quality of Life - 01/20/19 1026      Quality of Life   Select  Quality of Life      Quality of Life Scores   Health/Function  Post  --   Pt did not complete   Socioeconomic Post  --   Pt did not complete.   Psych/Spiritual Post  --   Pt did not complete.   Family Post  --   Pt did not complete.   GLOBAL Post  --   Pt did not complete.      Personal Goals: Goals established at orientation with interventions provided to work toward goal. Personal Goals and Risk Factors at Admission - 11/29/18 1516      Core Components/Risk Factors/Patient Goals on Admission    Weight Management  Yes;Weight Maintenance    Intervention  Weight Management: Develop a combined nutrition and exercise program designed to reach desired caloric intake, while maintaining appropriate intake of nutrient and fiber, sodium and fats, and appropriate energy expenditure required for the weight goal.;Weight Management: Provide education and appropriate resources to help participant work on and attain dietary goals.    Admit Weight  154 lb 8.7 oz (70.1 kg)    Expected Outcomes  Short Term: Continue to assess and modify interventions until short term weight is achieved;Long Term: Adherence to nutrition and physical activity/exercise program aimed toward attainment of established weight goal;Weight Maintenance: Understanding of the daily nutrition guidelines, which includes 25-35% calories from fat, 7% or less cal from saturated fats, less than 274m cholesterol, less than 1.5gm of sodium, & 5 or more servings of fruits and vegetables daily;Understanding recommendations for meals to include 15-35% energy as protein, 25-35% energy from fat, 35-60% energy from carbohydrates, less than 2032mof dietary cholesterol, 20-35 gm of total fiber daily;Understanding of distribution  of calorie intake throughout the day with the consumption of 4-5 meals/snacks    Improve shortness of breath with ADL's  Yes    Intervention  Provide education, individualized exercise plan and daily activity instruction to help decrease symptoms of SOB with activities of daily living.     Expected Outcomes  Short Term: Improve cardiorespiratory fitness to achieve a reduction of symptoms when performing ADLs;Long Term: Be able to perform more ADLs without symptoms or delay the onset of symptoms    Hypertension  Yes    Intervention  Provide education on lifestyle modifcations including regular physical activity/exercise, weight management, moderate sodium restriction and increased consumption of fresh fruit, vegetables, and low fat dairy, alcohol moderation, and smoking cessation.;Monitor prescription use compliance.    Expected Outcomes  Short Term: Continued assessment and intervention until BP is < 140/41m HG in hypertensive participants. < 130/847mHG in hypertensive participants with diabetes, heart failure or chronic kidney disease.;Long Term: Maintenance of blood pressure at goal levels.    Lipids  Yes    Intervention  Provide education and support for participant on nutrition & aerobic/resistive exercise along with prescribed medications to achieve LDL <7013mHDL >12m79m  Expected Outcomes  Short Term: Participant states understanding of desired cholesterol values and is compliant with medications prescribed. Participant is following exercise prescription and nutrition guidelines.;Long Term: Cholesterol controlled with medications as prescribed, with individualized exercise RX and with personalized nutrition plan. Value goals: LDL < 70mg77mL > 40 mg.        Personal Goals Discharge: Goals and Risk Factor Review    Row Name 12/07/18 1607 12/16/18 1216 12/20/18 1542 01/13/19 1627 01/20/19 1053     Core Components/Risk Factors/Patient Goals Review   Personal Goals Review  Weight Management/Obesity;Lipids;Improve shortness of breath with ADL's;Hypertension;Heart Failure  Weight Management/Obesity;Lipids;Improve shortness of breath with ADL's;Hypertension;Heart Failure  Weight Management/Obesity;Lipids;Improve shortness of breath with ADL's;Hypertension;Heart Failure  Weight  Management/Obesity;Lipids;Improve shortness of breath with ADL's;Hypertension;Heart Failure  Weight Management/Obesity;Lipids;Improve shortness of breath with ADL's;Hypertension;Heart Failure   Review  Patient has multiple CAD risk factors. He is eager to participate in cardiac rehab.  Patient continues to have multiple CAD risk factors but feels his shortness of breath is improving with weekly participation in CR. He continues to take all medications as prescribed including medications for HTN, lipids and HF. His weight remains stable.  GeorgRomneyinues to have multiple CAD risk factors but feels his shortness of breath is improving with weekly participation in CR. He continues to take all medications as prescribed including medications for HTN, lipids and HF. His weight remains stable.  GeorgToreinues to have multiple CAD risk factors but feels his shortness of breath is improving with weekly participation in CR. He continues to take all medications as prescribed including medications for HTN, lipids and HF. His weight remains stable.  Skip graduated today from CR [facility] [facility] completed sessions.  He graduated early d/t concerns of increasing rates of COVID-19 in the area.  Skip feels that he increased his strength and confidence by participating in CR.   Expected Outcomes  Patient will continue to participate in CR to modify risk factors and improve his quality of life secondary to heart failure. He would like to improve his shortnesss of breath and strengthen his legs. His ultimate goal is to maintain a healthy lifestyle.  Patient will continue to participate in CR to modify risk factors and improve his quality of life secondary to heart failure. He would like to  improve his shortnesss of breath and strengthen his legs. His ultimate goal is to maintain a healthy lifestyle.  Patient will continue to participate in CR to modify risk factors and improve his quality of life secondary to heart failure. He would like  to improve his shortnesss of breath and strengthen his legs. His ultimate goal is to maintain a healthy lifestyle.  Patient will continue to participate in CR to modify risk factors and improve his quality of life secondary to heart failure. He would like to improve his shortnesss of breath and strengthen his legs. His ultimate goal is to maintain a healthy lifestyle.  Patient will continue to participate exercise to modify risk factors and improve his quality of life secondary to heart failure.  Skip plans to work out 3 days a week with similar exercise machines and water aerobics two days a week at [employer].      Exercise Goals and Review: Exercise Goals    Row Name 11/29/18 1514             Exercise Goals   Increase Physical Activity  Yes       Intervention  Provide advice, education, support and counseling about physical activity/exercise needs.;Develop an individualized exercise prescription for aerobic and resistive training based on initial evaluation findings, risk stratification, comorbidities and participant's personal goals.       Expected Outcomes  Short Term: Attend rehab on a regular basis to increase amount of physical activity.;Long Term: Add in home exercise to make exercise part of routine and to increase amount of physical activity.;Long Term: Exercising regularly at least 3-5 days a week.       Increase Strength and Stamina  Yes       Intervention  Provide advice, education, support and counseling about physical activity/exercise needs.;Develop an individualized exercise prescription for aerobic and resistive training based on initial evaluation findings, risk stratification, comorbidities and participant's personal goals.       Expected Outcomes  Short Term: Increase workloads from initial exercise prescription for resistance, speed, and METs.;Short Term: Perform resistance training exercises routinely during rehab and add in resistance training at home;Long Term: Improve  cardiorespiratory fitness, muscular endurance and strength as measured by increased METs and functional capacity (6MWT)       Able to understand and use rate of perceived exertion (RPE) scale  Yes       Intervention  Provide education and explanation on how to use RPE scale       Expected Outcomes  Short Term: Able to use RPE daily in rehab to express subjective intensity level;Long Term:  Able to use RPE to guide intensity level when exercising independently       Knowledge and understanding of Target Heart Rate Range (THRR)  Yes       Intervention  Provide education and explanation of THRR including how the numbers were predicted and where they are located for reference       Expected Outcomes  Short Term: Able to state/look up THRR;Long Term: Able to use THRR to govern intensity when exercising independently;Short Term: Able to use daily as guideline for intensity in rehab       Able to check pulse independently  Yes       Intervention  Provide education and demonstration on how to check pulse in carotid and radial arteries.;Review the importance of being able to check your own pulse for safety during independent exercise       Expected Outcomes  Short  Term: Able to explain why pulse checking is important during independent exercise;Long Term: Able to check pulse independently and accurately       Understanding of Exercise Prescription  Yes       Intervention  Provide education, explanation, and written materials on patient's individual exercise prescription       Expected Outcomes  Short Term: Able to explain program exercise prescription;Long Term: Able to explain home exercise prescription to exercise independently          Exercise Goals Re-Evaluation: Exercise Goals Re-Evaluation    Row Name 12/07/18 1503 12/16/18 1039 12/20/18 1135 01/18/19 0845 01/20/19 1037     Exercise Goal Re-Evaluation   Exercise Goals Review  Increase Physical Activity;Increase Strength and Stamina;Able to  understand and use rate of perceived exertion (RPE) scale;Knowledge and understanding of Target Heart Rate Range (THRR);Understanding of Exercise Prescription  Increase Physical Activity;Increase Strength and Stamina;Able to understand and use rate of perceived exertion (RPE) scale;Knowledge and understanding of Target Heart Rate Range (THRR);Able to check pulse independently;Understanding of Exercise Prescription  Increase Physical Activity;Increase Strength and Stamina;Able to understand and use rate of perceived exertion (RPE) scale;Knowledge and understanding of Target Heart Rate Range (THRR);Able to check pulse independently;Understanding of Exercise Prescription  Increase Physical Activity;Increase Strength and Stamina;Able to understand and use rate of perceived exertion (RPE) scale;Able to check pulse independently;Knowledge and understanding of Target Heart Rate Range (THRR);Understanding of Exercise Prescription  Increase Physical Activity;Increase Strength and Stamina;Able to understand and use rate of perceived exertion (RPE) scale;Knowledge and understanding of Target Heart Rate Range (THRR);Able to check pulse independently;Understanding of Exercise Prescription   Comments  Pt tolerated first day of CR program well. Pt understands THRR, RPE scale, and exercise Rx.  Reviewed HEP with Pt. Pt understands goals, THRR, RPE scale, weather precautions, end points of exercise, warm up and cool down stretches, and pulse counting. Pt is currently taking water aerobics 2-3 days per week in addition to CR program.  Reviewed METs and goals wiht Pt. Pt is progressing well and has a MET level of 3.2. Pt continues to increase workloads. Pt understands home exercise goals and is currently taking water aerobics 2-3 days per week in addition to CR program.  Reviewed METs and goals with Pt. Pt is pwalking up starogressing well and has a MET level of 3.1. Pt stated wlaking up stairs and on an incline are easier than when  he first started due to the strength he has built while in CR program. Pt continues to exercsie on his own in addition to CR program by taking water aerobics 2-3 days per week.  Pt graduated CR program today. Pt progressed well in the program ending with a mTE level of 3.3. Pt stated he plans ot continue exercising at [facility] gym where he will be participating in water aerobics classes as well as using some gym equipment. He will be exercising 5-7 days per week for 30-45 minutes per day.   Expected Outcomes  Will continue to monitor and progress pt as tolerated.  Will continue to monitor and progress Pt as tolerated.  Will continue to monitor and progress Pt as tolerated.  Will continue to monitor and progress Pt as tolerated.  Pt will continue exercising at a local gym.      Nutrition & Weight - Outcomes: Pre Biometrics - 11/29/18 1514      Pre Biometrics   Height  5' 5.75" (1.67 m)    Weight  70.1 kg  Waist Circumference  40 inches    Hip Circumference  38 inches    Waist to Hip Ratio  1.05 %    BMI (Calculated)  25.14    Triceps Skinfold  12 mm    % Body Fat  26.6 %    Grip Strength  26 kg    Flexibility  0 in    Single Leg Stand  1.12 seconds      Post Biometrics - 01/20/19 1041       Post  Biometrics   Height  5' 3.75" (1.619 m)    Weight  70.4 kg    Waist Circumference  38.5 inches    Hip Circumference  38 inches    Waist to Hip Ratio  1.01 %    BMI (Calculated)  26.86    Triceps Skinfold  12 mm    % Body Fat  26.8 %    Grip Strength  25 kg    Flexibility  0 in    Single Leg Stand  4.12 seconds       Nutrition:   Nutrition Discharge:   Education Questionnaire Score: Knowledge Questionnaire Score - 01/20/19 1026      Knowledge Questionnaire Score   Post Score  23/24       Goals reviewed with patient; copy given to patient.

## 2019-01-27 ENCOUNTER — Encounter (HOSPITAL_COMMUNITY): Payer: Medicare Other

## 2019-01-30 ENCOUNTER — Encounter (HOSPITAL_COMMUNITY): Payer: Medicare Other

## 2019-02-01 ENCOUNTER — Encounter (HOSPITAL_COMMUNITY): Payer: Medicare Other

## 2019-02-03 ENCOUNTER — Encounter (HOSPITAL_COMMUNITY): Payer: Medicare Other

## 2019-02-06 ENCOUNTER — Encounter (HOSPITAL_COMMUNITY): Payer: Medicare Other

## 2019-02-07 ENCOUNTER — Ambulatory Visit (INDEPENDENT_AMBULATORY_CARE_PROVIDER_SITE_OTHER): Payer: Medicare Other | Admitting: *Deleted

## 2019-02-07 ENCOUNTER — Other Ambulatory Visit: Payer: Self-pay

## 2019-02-07 DIAGNOSIS — Z950 Presence of cardiac pacemaker: Secondary | ICD-10-CM | POA: Diagnosis not present

## 2019-02-07 LAB — CUP PACEART REMOTE DEVICE CHECK
Battery Remaining Longevity: 45 mo
Battery Remaining Percentage: 46 %
Battery Voltage: 2.86 V
Date Time Interrogation Session: 20201201113717
Implantable Lead Implant Date: 20140422
Implantable Lead Implant Date: 20140424
Implantable Lead Implant Date: 20140424
Implantable Lead Location: 753858
Implantable Lead Location: 753859
Implantable Lead Location: 753860
Implantable Lead Model: 5076
Implantable Lead Model: 5076
Implantable Pulse Generator Implant Date: 20140422
Lead Channel Impedance Value: 440 Ohm
Lead Channel Impedance Value: 990 Ohm
Lead Channel Pacing Threshold Amplitude: 1.25 V
Lead Channel Pacing Threshold Amplitude: 1.25 V
Lead Channel Pacing Threshold Pulse Width: 0.5 ms
Lead Channel Pacing Threshold Pulse Width: 0.5 ms
Lead Channel Sensing Intrinsic Amplitude: 9.2 mV
Lead Channel Setting Pacing Amplitude: 2.5 V
Lead Channel Setting Pacing Amplitude: 2.5 V
Lead Channel Setting Pacing Pulse Width: 0.5 ms
Lead Channel Setting Pacing Pulse Width: 0.5 ms
Lead Channel Setting Sensing Sensitivity: 4 mV
Pulse Gen Model: 3210
Pulse Gen Serial Number: 2926516

## 2019-02-08 ENCOUNTER — Encounter (HOSPITAL_COMMUNITY): Payer: Medicare Other

## 2019-02-10 ENCOUNTER — Encounter (HOSPITAL_COMMUNITY): Payer: Medicare Other

## 2019-02-10 ENCOUNTER — Other Ambulatory Visit (HOSPITAL_COMMUNITY): Payer: Self-pay | Admitting: Cardiology

## 2019-02-13 ENCOUNTER — Encounter (HOSPITAL_COMMUNITY): Payer: Medicare Other

## 2019-02-15 ENCOUNTER — Encounter (HOSPITAL_COMMUNITY): Payer: Medicare Other

## 2019-02-17 ENCOUNTER — Encounter (HOSPITAL_COMMUNITY): Payer: Medicare Other

## 2019-02-20 ENCOUNTER — Encounter (HOSPITAL_COMMUNITY): Payer: Medicare Other

## 2019-02-22 ENCOUNTER — Encounter (HOSPITAL_COMMUNITY): Payer: Medicare Other

## 2019-02-24 ENCOUNTER — Encounter (HOSPITAL_COMMUNITY): Payer: Medicare Other

## 2019-03-04 NOTE — Progress Notes (Signed)
PPM remote 

## 2019-03-10 HISTORY — PX: BASAL CELL CARCINOMA EXCISION: SHX1214

## 2019-03-17 DIAGNOSIS — L905 Scar conditions and fibrosis of skin: Secondary | ICD-10-CM | POA: Diagnosis not present

## 2019-03-23 DIAGNOSIS — Z23 Encounter for immunization: Secondary | ICD-10-CM | POA: Diagnosis not present

## 2019-03-24 ENCOUNTER — Other Ambulatory Visit (HOSPITAL_COMMUNITY): Payer: Self-pay | Admitting: Cardiology

## 2019-03-28 DIAGNOSIS — Z9189 Other specified personal risk factors, not elsewhere classified: Secondary | ICD-10-CM | POA: Diagnosis not present

## 2019-04-03 DIAGNOSIS — Z9189 Other specified personal risk factors, not elsewhere classified: Secondary | ICD-10-CM | POA: Diagnosis not present

## 2019-04-07 ENCOUNTER — Encounter: Payer: Self-pay | Admitting: Family Medicine

## 2019-04-08 NOTE — Telephone Encounter (Signed)
I spoke with patient.  He is doing as well as he possibly can through a challenging time.  Has family coming in for support.  He is still going to get his COVID-19 vaccination on February 10

## 2019-04-11 ENCOUNTER — Encounter: Payer: Self-pay | Admitting: Family Medicine

## 2019-04-17 ENCOUNTER — Encounter: Payer: Self-pay | Admitting: Podiatry

## 2019-04-17 ENCOUNTER — Ambulatory Visit (INDEPENDENT_AMBULATORY_CARE_PROVIDER_SITE_OTHER): Payer: Medicare Other | Admitting: Podiatry

## 2019-04-17 ENCOUNTER — Other Ambulatory Visit: Payer: Self-pay

## 2019-04-17 VITALS — Temp 96.8°F

## 2019-04-17 DIAGNOSIS — M79674 Pain in right toe(s): Secondary | ICD-10-CM

## 2019-04-17 DIAGNOSIS — L600 Ingrowing nail: Secondary | ICD-10-CM

## 2019-04-17 DIAGNOSIS — M79675 Pain in left toe(s): Secondary | ICD-10-CM

## 2019-04-17 DIAGNOSIS — B351 Tinea unguium: Secondary | ICD-10-CM

## 2019-04-17 DIAGNOSIS — D689 Coagulation defect, unspecified: Secondary | ICD-10-CM

## 2019-04-17 NOTE — Patient Instructions (Signed)

## 2019-04-17 NOTE — Progress Notes (Signed)
Subjective:   Patient ID: [name], male   DOB: 86 y.o.   MRN: [id number]   HPI Patient presents with a painful left hallux nail distal two thirds of the bed with looseness that he states he cannot wear shoe gear with comfortably.  All other nails are thickened yellow and brittle and painful when palpated he cannot cut them himself and the left hallux is excruciatingly tender   ROS      Objective:  Physical Exam  Neurovascular status unchanged with patient noted to have a damaged left hallux nail that is loose painful when pressed and making shoe gear difficult.  Patient's other nails are all incurvated in the corners and painful with pressure but not to the same degree     Assessment:  Damage left hallux nail that is painful when pressed along with mycotic nail infection of remaining 9 nails H and     Plan:  P reviewed both conditions and today I anesthetized the left hallux 60 mg Xylocaine Marcaine mixture I sterile removed the left hallux nail distal two thirds flushed the bed applied sterile dressing and instructed on soaks.  Debrided all other 9 nails with no iatrogenic bleeding and reappoint for routine care

## 2019-04-18 DIAGNOSIS — Z23 Encounter for immunization: Secondary | ICD-10-CM | POA: Diagnosis not present

## 2019-04-26 ENCOUNTER — Encounter: Payer: Self-pay | Admitting: Family Medicine

## 2019-04-26 DIAGNOSIS — Z86006 Personal history of melanoma in-situ: Secondary | ICD-10-CM | POA: Diagnosis not present

## 2019-04-26 DIAGNOSIS — Z23 Encounter for immunization: Secondary | ICD-10-CM | POA: Diagnosis not present

## 2019-04-26 DIAGNOSIS — L814 Other melanin hyperpigmentation: Secondary | ICD-10-CM | POA: Diagnosis not present

## 2019-04-26 DIAGNOSIS — L570 Actinic keratosis: Secondary | ICD-10-CM | POA: Diagnosis not present

## 2019-04-26 DIAGNOSIS — L821 Other seborrheic keratosis: Secondary | ICD-10-CM | POA: Diagnosis not present

## 2019-04-26 DIAGNOSIS — D1801 Hemangioma of skin and subcutaneous tissue: Secondary | ICD-10-CM | POA: Diagnosis not present

## 2019-04-26 DIAGNOSIS — D1724 Benign lipomatous neoplasm of skin and subcutaneous tissue of left leg: Secondary | ICD-10-CM | POA: Diagnosis not present

## 2019-04-26 DIAGNOSIS — D225 Melanocytic nevi of trunk: Secondary | ICD-10-CM | POA: Diagnosis not present

## 2019-04-26 DIAGNOSIS — L578 Other skin changes due to chronic exposure to nonionizing radiation: Secondary | ICD-10-CM | POA: Diagnosis not present

## 2019-04-26 DIAGNOSIS — D485 Neoplasm of uncertain behavior of skin: Secondary | ICD-10-CM | POA: Diagnosis not present

## 2019-04-26 DIAGNOSIS — Z85828 Personal history of other malignant neoplasm of skin: Secondary | ICD-10-CM | POA: Diagnosis not present

## 2019-04-28 ENCOUNTER — Telehealth (HOSPITAL_COMMUNITY): Payer: Medicare Other | Admitting: Cardiology

## 2019-04-28 ENCOUNTER — Other Ambulatory Visit: Payer: Self-pay

## 2019-04-28 ENCOUNTER — Ambulatory Visit (HOSPITAL_COMMUNITY)
Admission: RE | Admit: 2019-04-28 | Discharge: 2019-04-28 | Disposition: A | Discharge status: Home / Self Care | Payer: Medicare Other | Source: Ambulatory Visit | Attending: Cardiology | Admitting: Cardiology

## 2019-04-28 ENCOUNTER — Encounter (HOSPITAL_COMMUNITY): Payer: Self-pay

## 2019-04-28 DIAGNOSIS — I5022 Chronic systolic (congestive) heart failure: Secondary | ICD-10-CM | POA: Diagnosis not present

## 2019-04-28 NOTE — Patient Instructions (Addendum)
Labs: Tuesday, February 23rd 2021 at [place] We will only contact you if something comes back abnormal or we need to make some changes. Otherwise no news is good news!   Your physician recommends that you schedule a follow-up appointment in: 3 months with Dr Aundra Dubin   Next appointment: Wednesday, May 19th, 2021 at 9:40AM Parking code [id number]   Please call office at [phone] option [phone] if you have any questions or concerns.    At the [facility], you and your health needs are our priority. As part of our continuing mission to provide you with exceptional heart care, we have created designated Provider Care Teams. These Care Teams include your primary Cardiologist (physician) and Advanced Practice Providers (APPs- Physician Assistants and Nurse Practitioners) who all work together to provide you with the care you need, when you need it.   You may see any of the following providers on your designated Care Team at your next follow up: Marland Kitchen Dr Glori Bickers . Dr [name] . Darrick Grinder, NP . Lyda Jester, PA . Audry Riles, PharmD   Please be sure to bring in all your medications bottles to every appointment.

## 2019-04-28 NOTE — Progress Notes (Signed)
Discussed AVS, appts scheduled. Verbalized understanding. Information sent on mychart

## 2019-04-30 NOTE — Progress Notes (Signed)
Heart Failure TeleHealth Note  Due to national recommendations of social distancing due to [place] 19, Audio/video telehealth visit is felt to be most appropriate for this patient at this time.  See MyChart message from today for patient consent regarding telehealth for [facility].  Date:  04/30/2019   ID:  [name], DOB 01-Aug-1929, MRN [id number]  Location: Home  Provider location: [facility] Type of Visit: Established patient   PCP:  Craig Olp, MD  Cardiologist:  No primary care provider on file. Primary HF: Craig. Aundra [name]  Chief Complaint: Dyspnea   History of Present Illness: [name] is a 86 y.o. male who presents via audio/video conferencing for a telehealth visit today.     he denies symptoms worrisome for COVID 19.   Patient has a history of paroxysmal atrial fibrillation, nonischemic cardiomyopathy, and complete heart block with St Jude CRT-P system.  He was admitted in 4/14 with pacemaker pocket infection.  His pacemaker was removed and temporary-permanent device was placed.  Later, he had the temporary-permanent device removed and a new CRT-P device was placed. He developed dyspnea post-operatively and was found to have a large right-sided pleural effusion.  He was admitted and got a chest tube for drainage. He had followup with [name] and it was decided that he would not need VATS.  He had been on amiodarone for maintenance of NSR.  This had been more successful than Tikosyn.   Around 3/15, he started developing increasing exertional dyspnea.  He was short of breath walking up a hill or incline.  He could still walk on the treadmill for exercise for 10-15 minutes and use the elliptical without much trouble on most days.  No orthopnea or PND.  No chest pain.  He saw [name] and was started on Lasix three times a week due to concern for volume overload.  This did not seem to have helped much.  After that, he had PFTs done which  showed normal spirometry but DLCO 50% predicted.  Therefore, amiodarone was stopped due to concern for lung toxicity.  He saw [name] for a pre-operative evaluation prior to right inguinal hernia repair.  Given the exertional dyspnea, he was set up for adenosine Cardiolite in 5/15.  This showed EF 36% with a small area of primarily scar in the apex, apical lateral wall, and mid anterolateral wall.  He has not felt palpitations. Echo in 5/15 showed EF 40-45%, similar to prior study.   I had him see pulmonary for evaluation for amiodarone lung toxicity.  CT chest looked ok, and he was not thought to have amiodarone lung toxicity.   At a prior appointment in 2016, [name] was noted to be back in atrial fibrillation.  He was more short of breath with exertion and fatigued.  He has historically tolerated atrial fibrillation poorly.  I did a TEE-guided DCCV in 5/16 back to NSR.  TEE showed EF 35-40%.  He did not immediately feel better like he has in the past post-DCCV, so Lasix was increased.    Recurrent symptomatic atrial fibrillation in 8/17 with TEE-guided DCCV to NSR.  TEE showed EF 25-30%.  Recurrent symptomatic atrial fibrillation with CHF exacerbation in 9/17.  He had repeat DCCV and Lasix was increased.   He was back in atrial fibrillation in 11/17 and appears to have been out of rhythm since then.  He was more short of breath in atrial fibrillation with a significant fall in  BiV pacing percentage.  Lasix was significantly increased to try to cope with volume overload.  In 2/18, he had AV nodal ablation to promote BiV pacing.    Admitted 5/19 with right eye lower visual field loss, found to have severe right carotid stenosis. S/p R CEA on 07/26/17. Switched from coumadin to Eliquis. Volume was stable.  Echo in 5/19 showed EF 30-35%, mild AI.   Echo in 6/20 showed EF 30-35% with mildly decreased RV systolic function.  PYP scan 11/20 was not suggestive of ATTR amyloidosis.  CPX (9/20) showed  moderate functional deficit due to CHF.    He lost his wife a couple of weeks ago, still grieving.  He remains short of breath with moderate exertion.  He goes to the gym 3 days/week still and rides stationary bike for 20 minutes.  No chest pain.  No orthopnea/PND.  No lightheadedness.   Labs (5/19): LDL 72, HDL 41, K 4.2, creatinine 0.86 Labs (10/19): K 3.9, creatinine 0.96 Labs (12/19): K 4.4, creatinine 0.99, LDL 63, HDl 41 Labs (3/20): K 4.4, creatinine 1.03 Labs (6/20): TSH normal, K 3.8, creatinine 0.97 Labs (9/20): Myeloma panel negative, urine immunofixation negative, K 3.7, creatinine 0.93  PMH: 1. Low back pain 2. Hyperlipidemia 3. GERD 4. BPH 5. Polymyalgia rheumatica 6. Atrial fibrillation: Failed Tikosyn in the past.  Paroxysmal, on amiodarone and warfarin.  DCCV in 4/13, 7/13, 10/13.  Has held NSR with amiodarone.  However, concern for amiodarone lung toxicity: PFTs (4/15) with FVC 90% predicted, FEV1 90%, ratio 97%, DLCO 50%.   Recurrence of atrial fibrillation in 5/16, TEE-guided DCCV back to NSR in 5/16.  - Recurrence of atrial fibrillation in 8/17. TEE-guided DCCV 11/07/15.  - Recurrence of atrial fibrillation 9/17.  DCCV 11/20/15 but back in atrial fibrillation by 11/26/15.  - Amiodarone stopped due to persistent atrial fibrillation.  - AV nodal ablation in 2/18 to promote BiV pacing.  7. Transaminitis: Mild, attributed to amiodarone.  8. Complete heart block s/p St Jude CRT-P device.  Patient developed PCM pocket infection in 4/14 and had his first system removed with placement of a temporary permanent PCM.  He later had CRT-P device replaced.  This was complicated by large right-sided hemorrhagic pleural effusion requiring chest tube.   9. Nonischemic cardiomyopathy: Echo (9/13) with EF 40-45%, diffuse HK, mild Craig.  Mild CAD only on prior LHC.  Echo (4/14) with EF 45-50%.  Adenosine Cardiolite (5/15) with EF 36% (visually appeared higher per report), small area of scar with  peri-infarct ischemia in the apex, apical lateral wall, and mid anterolateral wall. Echo (5/15) with EF 40-45%, basal to mid inferolateral severe hypokinesis, basal to mid anterolateral hypokinesis, mildly dilated RV with mildly decreased RV systolic function, mild to moderate Craig.  TEE (5/16) with EF 35-40%, inferior and inferolateral severe hypokinesis, normal RV size with mildly decreased systolic function, moderate Craig, peak RV-RA gradient 26 mmHg.  - TEE (8/17): EF 25-30%, mildly dilated RV with mildly decreased systolic function, moderate biatrial enlargement, moderate central Craig.  - AV nodal ablation 2/18.  - Echo (5/19): EF 30-35%, mild AI.  - Echo (6/20): EF 30-35%, mild LV dilation, mild RV enlargement with mildly decreased systolic function, mild Craig.  - CPX (9/20): Peak VO2 14.4, VE/VCO2 48, RER 1.16 => moderate HF limitation.  10. Large right pleural effusion requiring chest tube following removal and reimplantation of PCM. Most recent chest CT in 6/15 showed significant decrease in size of loculated pleural effusion since 5/14.  11. Right CEA 5/19.  12. Right central retinal artery occlusion in 5/19.  13. PYP scan (11/20): Grade 1, H/CL 1.2 => no evidence for ATTR amyloidosis.   SH: Married, prior smoker (quit 1985), no ETOH x years, retired, lives at [employer]  [place]: Father with MI at 73, mother with AAA.   Review of systems complete and found to be negative unless listed in HPI.   Current Outpatient Medications  Medication Sig Dispense Refill   acetaminophen (TYLENOL) 500 MG tablet Take 1,000 mg by mouth every 6 (six) hours as needed for moderate pain or headache.     dutasteride (AVODART) 0.5 MG capsule Take 0.5 mg by mouth daily.      ELIQUIS 5 MG TABS tablet TAKE 1 TABLET BY MOUTH TWICE A DAY 60 tablet 11   famotidine (PEPCID) 20 MG tablet Take 20 mg by mouth 2 (two) times daily as needed for heartburn or indigestion.     fluticasone (FLONASE) 50 MCG/ACT nasal spray Place  2 sprays into both nostrils daily as needed for allergies or rhinitis. 16 g 3   furosemide (LASIX) 80 MG tablet Take 80mg  in the AM and 40mg  in the PM 135 tablet 3   hydroxypropyl methylcellulose / hypromellose (ISOPTO TEARS / GONIOVISC) 2.5 % ophthalmic solution Place 1 drop into both eyes 3 (three) times daily as needed for dry eyes.      losartan (COZAAR) 25 MG tablet Take 0.5 tablets (12.5 mg total) by mouth daily. 45 tablet 3   metoprolol succinate (TOPROL-XL) 25 MG 24 hr tablet Take 1 tablet (25 mg total) by mouth 2 (two) times daily. 180 tablet 3   rosuvastatin (CRESTOR) 10 MG tablet TAKE 1 TABLET BY MOUTH EVERYDAY AT BEDTIME 90 tablet 3   spironolactone (ALDACTONE) 25 MG tablet Take 25 mg by mouth daily.     tamsulosin (FLOMAX) 0.4 MG CAPS capsule Take 0.4 mg by mouth 2 (two) times daily.      triamcinolone cream (KENALOG) 0.1 % Apply 1 application topically 2 (two) times daily. For 7-10 days maximum 28.4 g 0   Wheat Dextrin (BENEFIBER DRINK MIX PO) Take by mouth. 2 tsps daily     No current facility-administered medications for this encounter.    There were no vitals taken for this visit.  Wt Readings from Last 3 Encounters:  01/26/19 69.8 kg (153 lb 12.8 oz)  01/20/19 70.4 kg (155 lb 3.3 oz)  11/30/18 68 kg (150 lb)   Exam:  (Video/Tele Health Call; Exam is subjective and or/visual.) BP 99/75 General:  Speaks in full sentences. No resp difficulty. Lungs: Normal respiratory effort with conversation.  Abdomen: Non-distended per patient report Extremities: Pt denies edema. Neuro: Alert & oriented x 3.   Assessment/Plan: 1. Chronic systolic CHF:  Presumed nonischemic cardiomyopathy (prior cath with only mild CAD).  He had an adenosine Cardiolite in 5/15 with EF 36% and possible small area of apical and lateral scar with peri-infarct ischemia. He has St Jude CRT-P.  Echo in 6/20 showed EF 30-35%. CPX (9/20) with moderate HF limitation. NYHA class II, weight stable. Med  titration limited by orthostatic symptoms.  - Continue Lasix 80 qam/40 qpm.  I will arrange for BMET.   - Continue Toprol XL 25 mg bid.  - Continue spironolactone 25 mg daily.  - Continue losartan 12.5 mg daily.  No BP room to increase.  2. Atrial fibrillation: Now persistent.  Had breakthrough with Tikosyn and now not able to hold NSR on amiodarone.  He is now off amiodarone.  He is status post AV nodal ablation with significant improvement in symptoms and BiV pacing percentage (94% today, suspect mildly low due to PVCs).  - Continue apixiban 5 mg BID.  I will arrange for CBC.  3. Hyperlipidemia: History of nonobstructive CAD and recent CEA.    - Continue Crestor, good lipids in 11/20.  4. Rt CEA occlusion s/p R CEA 07/26/17: He had an ulcerated, 70% stenotic plaque in the right internal carotid. Suspect this is the source of embolism for CRAO.     - Continue statin.  - Followed at [facility].    COVID screen The patient does not have any symptoms that suggest any further testing/ screening at this time.  Social distancing reinforced today.  Patient Risk: After full review of this patients clinical status, I feel that they are at moderate risk for cardiac decompensation at this time.  Relevant cardiac medications were reviewed at length with the patient today. The patient does not have concerns regarding their medications at this time.   Recommended follow-up:  3 months.   Today, I have spent 17 minutes with the patient with telehealth technology discussing the above issues .    Signed, [name], MD  04/30/2019  [place] [address] [facility] [place] [address] [phone] (office) [phone] (fax)

## 2019-05-01 ENCOUNTER — Encounter: Payer: Self-pay | Admitting: Family Medicine

## 2019-05-01 ENCOUNTER — Ambulatory Visit (INDEPENDENT_AMBULATORY_CARE_PROVIDER_SITE_OTHER): Payer: Medicare Other | Admitting: Family Medicine

## 2019-05-01 VITALS — BP 86/51 | HR 69 | Ht 63.75 in | Wt 148.0 lb

## 2019-05-01 DIAGNOSIS — Z85820 Personal history of malignant melanoma of skin: Secondary | ICD-10-CM

## 2019-05-01 DIAGNOSIS — G8929 Other chronic pain: Secondary | ICD-10-CM | POA: Diagnosis not present

## 2019-05-01 DIAGNOSIS — J309 Allergic rhinitis, unspecified: Secondary | ICD-10-CM

## 2019-05-01 DIAGNOSIS — I4811 Longstanding persistent atrial fibrillation: Secondary | ICD-10-CM | POA: Diagnosis not present

## 2019-05-01 DIAGNOSIS — M25511 Pain in right shoulder: Secondary | ICD-10-CM

## 2019-05-01 NOTE — Patient Instructions (Signed)
Health Maintenance Due  Topic Date Due  . INFLUENZA VACCINE  10/08/2018   Depression screen [facility] 2/9 01/20/2019 11/29/2018 06/03/2018  Decreased Interest 0 0 0  Down, Depressed, Hopeless 1 1 0  PHQ - 2 Score 1 1 0  Some recent data might be hidden

## 2019-05-01 NOTE — Progress Notes (Signed)
Phone [phone] Virtual visit via Video note   Subjective:  Chief complaint: Chief Complaint  Patient presents with  . virtual  . shoulder pain  . nasal drip   This visit type was conducted due to national recommendations for restrictions regarding the COVID-19 Pandemic (e.g. social distancing).  This format is felt to be most appropriate for this patient at this time balancing risks to patient and risks to population by having him in for in person visit.  No physical exam was performed (except for noted visual exam or audio findings with Telehealth visits).    Our team/I connected with [name] at 11:00 AM EST by a video enabled telemedicine application (doxy.me or caregility through epic) and verified that I am speaking with the correct person using two identifiers.  Location patient: Home-[place] Location provider: [facility], office Persons participating in the virtual visit:  patient  Our team/I discussed the limitations of evaluation and management by telemedicine and the availability of in person appointments. In light of current covid-19 pandemic, patient also understands that we are trying to protect them by minimizing in office contact if at all possible.  The patient expressed consent for telemedicine visit and agreed to proceed. Patient understands insurance will be billed.   Past Medical History-  Patient Active Problem List   Diagnosis Date Noted  . History of melanoma 05/01/2019    Priority: High  . History of central retinal artery occlusion 08/04/2017    Priority: High  . Stenosis of right internal carotid artery s/p endarterectomy 07/2017     Priority: High  . Chronic systolic CHF (congestive heart failure) ([place]) 03/21/2013    Priority: High  . Nonischemic cardiomyopathy ([place]) 09/20/2010    Priority: High  . PPM-St.Jude 11/27/2008    Priority: High  . CAD (coronary artery disease) 07/20/2008    Priority: High  . SICK SINUS SYNDROME 07/20/2008   Priority: High  . History of polymyalgia rheumatica 07/03/2008    Priority: High  . Atrial fibrillation ([place]) 03/16/2007    Priority: High  . AK (actinic keratosis) 01/28/2016    Priority: Medium  . Lipoma 07/08/2015    Priority: Medium  . Neuropathy 05/16/2015    Priority: Medium  . Chronic cough 08/18/2013    Priority: Medium  . Pleural effusion, right 08/16/2013    Priority: Medium  . Pleural effusion on right 07/06/2012    Priority: Medium  . Hyperlipidemia 03/16/2007    Priority: Medium  . BPH (benign prostatic hyperplasia) 03/16/2007    Priority: Medium  . Thrombus of left atrial appendage without antecedent myocardial infarction 07/24/2017    Priority: Low  . Traumatic hematoma 04/01/2017    Priority: Low  . Left knee pain 10/23/2016    Priority: Low  . Prolapsed internal hemorrhoids, grade 2 - causing fecal seepage 06/10/2016    Priority: Low  . Chronic anticoagulation 02/10/2016    Priority: Low  . Left breast lump 07/15/2015    Priority: Low  . Trigger thumb of right hand 07/04/2014    Priority: Low  . Encounter for therapeutic drug monitoring 07/02/2014    Priority: Low  . Allergic rhinitis 02/14/2014    Priority: Low  . Fecal incontinence 10/18/2013    Priority: Low  . Recurrent right inguinal hernia 11/22/2012    Priority: Low  . Intention tremor 11/01/2012    Priority: Low  . MIXED HEARING LOSS BILATERAL 01/10/2008    Priority: Low  . GERD 03/16/2007    Priority: Low  .  Incomplete passage of stool 04/06/2018  . Trochanteric bursitis of right hip 09/29/2017    Medications- reviewed and updated Current Outpatient Medications  Medication Sig Dispense Refill  . acetaminophen (TYLENOL) 500 MG tablet Take 1,000 mg by mouth every 6 (six) hours as needed for moderate pain or headache.    . dutasteride (AVODART) 0.5 MG capsule Take 0.5 mg by mouth daily.     Marland Kitchen ELIQUIS 5 MG TABS tablet TAKE 1 TABLET BY MOUTH TWICE A DAY 60 tablet 11  . famotidine  (PEPCID) 20 MG tablet Take 20 mg by mouth 2 (two) times daily as needed for heartburn or indigestion.    . fluticasone (FLONASE) 50 MCG/ACT nasal spray Place 2 sprays into both nostrils daily as needed for allergies or rhinitis. 16 g 3  . furosemide (LASIX) 80 MG tablet Take 80mg  in the AM and 40mg  in the PM 135 tablet 3  . hydroxypropyl methylcellulose / hypromellose (ISOPTO TEARS / GONIOVISC) 2.5 % ophthalmic solution Place 1 drop into both eyes 3 (three) times daily as needed for dry eyes.     Marland Kitchen losartan (COZAAR) 25 MG tablet Take 0.5 tablets (12.5 mg total) by mouth daily. 45 tablet 3  . metoprolol succinate (TOPROL-XL) 25 MG 24 hr tablet Take 1 tablet (25 mg total) by mouth 2 (two) times daily. 180 tablet 3  . rosuvastatin (CRESTOR) 10 MG tablet TAKE 1 TABLET BY MOUTH EVERYDAY AT BEDTIME 90 tablet 3  . spironolactone (ALDACTONE) 25 MG tablet Take 25 mg by mouth daily.    . tamsulosin (FLOMAX) 0.4 MG CAPS capsule Take 0.4 mg by mouth 2 (two) times daily.     Marland Kitchen triamcinolone cream (KENALOG) 0.1 % Apply 1 application topically 2 (two) times daily. For 7-10 days maximum 28.4 g 0  . Wheat Dextrin (BENEFIBER DRINK MIX PO) Take by mouth. 2 tsps daily     No current facility-administered medications for this visit.     Objective:  BP (!) 86/51   Pulse 69   Ht 5' 3.75" (1.619 m)   Wt 148 lb (67.1 kg)   BMI 25.60 kg/m  self reported vitals Gen: NAD, resting comfortably Lungs: nonlabored, normal respiratory rate  Skin: appears dry, no obvious rash after course Msk: Pain with empty can test and with Neer like test    Assessment and Plan   # Grieving- doing best after loss of his wife. We discussed some of her case.  He is going to right a note to Dr. Maylon Peppers which he would like for me to forward to him-I am happy to do so  Shoulder Pain S: pt c/o right shoulder pain that has been bothering him for a couple of months that comes and goes. Worse when lifts arm up over his head.  The pain is dull  and he would rate it a 5/10 it bothers him more when he moves it or sleeps on it. Pt has taken Tylenol. A/P: I suspect this is either rotator cuff related or due to some underlying arthritis.  Patient has worked with [employer] and we opted to refer back today-discussed possible treatment with prednisone but patient would like to hold off for now.  He is not a good candidate for NSAIDs  Nasal Drip/allergic rhinitis S: pt c/o nasal drip that he has had for years and it will not go away. Pt uses nasal spray. Pt denies cough and loss of taste of smell and fever.  Patient has used [facility]  in the past with some success. A/P: Poor control of allergic rhinitis-he asks if he can use a longer trial Flonase-we opted to try for 1 month and see if this resolves issues.  Denies any optical issues at present and follows up with them regularly so I think a longer course of Flonase would be reasonable  #Other updates-patient did have melanoma when referred to dermatology.  He also had a basal cell skin cancer removed  #Atrial fibrillation S: Per notes from Dr. Aundra Dubin now persistent.  Patient states he is compliant with his metoprolol for rate control.  He is also compliant with Eliquis for anticoagulation A/P: Appears to be stable-continue current medication.  Will avoid NSAIDs as above due to Eliquis use.  Continue cardiology follow-up as well  Recommended follow up:  Future Appointments  Date Time Provider [place]  05/02/2019  9:00 AM [facility] LAB [facility] None  05/09/2019  7:45 AM [facility] DEVICE REMOTES [facility]  07/26/2019  9:40 AM Larey Dresser, MD [facility] None  08/08/2019  7:45 AM [facility] DEVICE REMOTES [facility]  08/18/2019 10:30 AM Patel, Arvin Collard K, DO [facility] None   Lab/Order associations:   ICD-10-CM   1. Chronic right shoulder pain  M25.511 Ambulatory referral to Physical Therapy   G89.29   2. History of melanoma  Z85.820   3.  Longstanding persistent atrial fibrillation (HCC)  I48.11   4. Allergic rhinitis, unspecified seasonality, unspecified trigger  J30.9    Return precautions advised.  Garret Reddish, MD

## 2019-05-02 ENCOUNTER — Ambulatory Visit (HOSPITAL_COMMUNITY)
Admission: RE | Admit: 2019-05-02 | Discharge: 2019-05-02 | Disposition: A | Discharge status: Home / Self Care | Payer: Medicare Other | Source: Ambulatory Visit | Attending: Internal Medicine | Admitting: Internal Medicine

## 2019-05-02 ENCOUNTER — Other Ambulatory Visit: Payer: Self-pay

## 2019-05-02 DIAGNOSIS — I5022 Chronic systolic (congestive) heart failure: Secondary | ICD-10-CM | POA: Diagnosis not present

## 2019-05-02 LAB — BASIC METABOLIC PANEL
Anion gap: 9 (ref 5–15)
BUN: 24 mg/dL — ABNORMAL HIGH (ref 8–23)
CO2: 26 mmol/L (ref 22–32)
Calcium: 9.3 mg/dL (ref 8.9–10.3)
Chloride: 104 mmol/L (ref 98–111)
Creatinine, Ser: 1.02 mg/dL (ref 0.61–1.24)
GFR calc Af Amer: >60 mL/min (ref >60)
GFR calc non Af Amer: >60 mL/min (ref >60)
Glucose, Bld: 93 mg/dL (ref 70–99)
Potassium: 3.9 mmol/L (ref 3.5–5.1)
Sodium: 139 mmol/L (ref 135–145)

## 2019-05-02 LAB — CBC
HCT: 41.5 % (ref 39.0–52.0)
Hemoglobin: 13.7 g/dL (ref 13.0–17.0)
MCH: 30.9 pg (ref 26.0–34.0)
MCHC: 33 g/dL (ref 30.0–36.0)
MCV: 93.5 fL (ref 80.0–100.0)
Platelets: 216 10*3/uL (ref 150–400)
RBC: 4.44 MIL/uL (ref 4.22–5.81)
RDW: 13.9 % (ref 11.5–15.5)
WBC: 7.2 10*3/uL (ref 4.0–10.5)
nRBC: 0 % (ref 0.0–0.2)

## 2019-05-09 ENCOUNTER — Ambulatory Visit (INDEPENDENT_AMBULATORY_CARE_PROVIDER_SITE_OTHER): Payer: Medicare Other | Admitting: *Deleted

## 2019-05-09 DIAGNOSIS — Z950 Presence of cardiac pacemaker: Secondary | ICD-10-CM | POA: Diagnosis not present

## 2019-05-10 DIAGNOSIS — C44319 Basal cell carcinoma of skin of other parts of face: Secondary | ICD-10-CM | POA: Diagnosis not present

## 2019-05-10 LAB — CUP PACEART REMOTE DEVICE CHECK
Battery Remaining Longevity: 39 mo
Battery Remaining Percentage: 40 %
Battery Voltage: 2.84 V
Date Time Interrogation Session: 20210302200728
Implantable Lead Implant Date: 20140422
Implantable Lead Implant Date: 20140424
Implantable Lead Implant Date: 20140424
Implantable Lead Location: 753858
Implantable Lead Location: 753859
Implantable Lead Location: 753860
Implantable Lead Model: 5076
Implantable Lead Model: 5076
Implantable Pulse Generator Implant Date: 20140422
Lead Channel Impedance Value: 430 Ohm
Lead Channel Impedance Value: 950 Ohm
Lead Channel Pacing Threshold Amplitude: 1.25 V
Lead Channel Pacing Threshold Amplitude: 1.25 V
Lead Channel Pacing Threshold Pulse Width: 0.5 ms
Lead Channel Pacing Threshold Pulse Width: 0.5 ms
Lead Channel Sensing Intrinsic Amplitude: 9.9 mV
Lead Channel Setting Pacing Amplitude: 2.5 V
Lead Channel Setting Pacing Amplitude: 2.5 V
Lead Channel Setting Pacing Pulse Width: 0.5 ms
Lead Channel Setting Pacing Pulse Width: 0.5 ms
Lead Channel Setting Sensing Sensitivity: 4 mV
Pulse Gen Model: 3210
Pulse Gen Serial Number: 2926516

## 2019-05-10 NOTE — Progress Notes (Signed)
PPM Remote  

## 2019-05-22 DIAGNOSIS — Z961 Presence of intraocular lens: Secondary | ICD-10-CM | POA: Diagnosis not present

## 2019-05-22 DIAGNOSIS — H52203 Unspecified astigmatism, bilateral: Secondary | ICD-10-CM | POA: Diagnosis not present

## 2019-05-22 DIAGNOSIS — H349 Unspecified retinal vascular occlusion: Secondary | ICD-10-CM | POA: Diagnosis not present

## 2019-05-23 ENCOUNTER — Other Ambulatory Visit: Payer: Self-pay

## 2019-05-23 MED ORDER — FLUTICASONE PROPIONATE 50 MCG/ACT NA SUSP: 2.0000 | Freq: Every day | NASAL | 3 refills | Status: DC | PRN

## 2019-06-05 ENCOUNTER — Telehealth: Payer: Self-pay | Admitting: Family Medicine

## 2019-06-05 NOTE — Telephone Encounter (Signed)
On 05/23/19 fluticasone (FLONASE) 50 MCG/ACT nasal spray  was refilled to [facility] but needed to be [facility] [place]   Please advise

## 2019-06-06 ENCOUNTER — Other Ambulatory Visit (HOSPITAL_COMMUNITY): Payer: Self-pay | Admitting: *Deleted

## 2019-06-06 ENCOUNTER — Other Ambulatory Visit: Payer: Self-pay

## 2019-06-06 ENCOUNTER — Ambulatory Visit (INDEPENDENT_AMBULATORY_CARE_PROVIDER_SITE_OTHER): Payer: Medicare Other

## 2019-06-06 DIAGNOSIS — Z0000 Encounter for general adult medical examination without abnormal findings: Secondary | ICD-10-CM | POA: Diagnosis not present

## 2019-06-06 MED ORDER — METOPROLOL SUCCINATE ER 25 MG PO TB24: 25.0000 mg | ORAL_TABLET | Freq: Two times a day (BID) | ORAL | 3 refills | Status: DC

## 2019-06-06 MED ORDER — FUROSEMIDE 80 MG PO TABS: ORAL_TABLET | ORAL | 3 refills | Status: DC

## 2019-06-06 MED ORDER — SPIRONOLACTONE 25 MG PO TABS: 25.0000 mg | ORAL_TABLET | Freq: Every day | ORAL | 3 refills | Status: DC

## 2019-06-06 MED ORDER — LOSARTAN POTASSIUM 25 MG PO TABS: 12.5000 mg | ORAL_TABLET | Freq: Every day | ORAL | 3 refills | Status: DC

## 2019-06-06 NOTE — Patient Instructions (Signed)
[name] , Thank you for taking time to come for your Medicare Wellness Visit. I appreciate your ongoing commitment to your health goals. Please review the following plan we discussed and let me know if I can assist you in the future.   Screening recommendations/referrals: Colorectal Screening: No longer indicated; last colonoscopy 01/03/14  Vision and Dental Exams: Recommended annual ophthalmology exams for early detection of glaucoma and other disorders of the eye Recommended annual dental exams for proper oral hygiene  Vaccinations: Influenza vaccine: completed 12/22/18 Pneumococcal vaccine: up to date; last 02/14/14 Tdap vaccine: up to date; last 02/14/14  Shingles vaccine: Shingrix completed  Covid vaccine:  Completed   Advanced directives: Please bring a copy of your POA (Power of Attorney) and/or Living Will to your next appointment.  Goals: Recommend to drink at least 6-8 8oz glasses of water per day and consume a balanced diet rich in fresh fruits and vegetables.   Next appointment: Please schedule your Annual Wellness Visit with your Nurse Health Advisor in one year.  Preventive Care 84 Years and Older, Male Preventive care refers to lifestyle choices and visits with your health care provider that can promote health and wellness. What does preventive care include?  A yearly physical exam. This is also called an annual well check.  Dental exams once or twice a year.  Routine eye exams. Ask your health care provider how often you should have your eyes checked.  Personal lifestyle choices, including:  Daily care of your teeth and gums.  Regular physical activity.  Eating a healthy diet.  Avoiding tobacco and drug use.  Limiting alcohol use.  Practicing safe sex.  Taking low doses of aspirin every day if recommended by your health care provider..  Taking vitamin and mineral supplements as recommended by your health care provider. What happens during an  annual well check? The services and screenings done by your health care provider during your annual well check will depend on your age, overall health, lifestyle risk factors, and family history of disease. Counseling  Your health care provider may ask you questions about your:  Alcohol use.  Tobacco use.  Drug use.  Emotional well-being.  Home and relationship well-being.  Sexual activity.  Eating habits.  History of falls.  Memory and ability to understand (cognition).  Work and work Statistician. Screening  You may have the following tests or measurements:  Height, weight, and BMI.  Blood pressure.  Lipid and cholesterol levels. These may be checked every 5 years, or more frequently if you are over 2 years old.  Skin check.  Lung cancer screening. You may have this screening every year starting at age 35 if you have a 30-pack-year history of smoking and currently smoke or have quit within the past 15 years.  Fecal occult blood test (FOBT) of the stool. You may have this test every year starting at age 66.  Flexible sigmoidoscopy or colonoscopy. You may have a sigmoidoscopy every 5 years or a colonoscopy every 10 years starting at age 70.  Prostate cancer screening. Recommendations will vary depending on your family history and other risks.  Hepatitis C blood test.  Hepatitis B blood test.  Sexually transmitted disease (STD) testing.  Diabetes screening. This is done by checking your blood sugar (glucose) after you have not eaten for a while (fasting). You may have this done every 1-3 years.  Abdominal aortic aneurysm (AAA) screening. You may need this if you are a current or former smoker.  Osteoporosis.  You may be screened starting at age 54 if you are at high risk. Talk with your health care provider about your test results, treatment options, and if necessary, the need for more tests. Vaccines  Your health care provider may recommend certain vaccines,  such as:  Influenza vaccine. This is recommended every year.  Tetanus, diphtheria, and acellular pertussis (Tdap, Td) vaccine. You may need a Td booster every 10 years.  Zoster vaccine. You may need this after age 38.  Pneumococcal 13-valent conjugate (PCV13) vaccine. One dose is recommended after age 43.  Pneumococcal polysaccharide (PPSV23) vaccine. One dose is recommended after age 65. Talk to your health care provider about which screenings and vaccines you need and how often you need them. This information is not intended to replace advice given to you by your health care provider. Make sure you discuss any questions you have with your health care provider. Document Released: 03/22/2015 Document Revised: 11/13/2015 Document Reviewed: 12/25/2014 Elsevier Interactive Patient Education  2017 [employer] Prevention in the Home Falls can cause injuries. They can happen to people of all ages. There are many things you can do to make your home safe and to help prevent falls. What can I do on the outside of my home?  Regularly fix the edges of walkways and driveways and fix any cracks.  Remove anything that might make you trip as you walk through a door, such as a raised step or threshold.  Trim any bushes or trees on the path to your home.  Use bright outdoor lighting.  Clear any walking paths of anything that might make someone trip, such as rocks or tools.  Regularly check to see if handrails are loose or broken. Make sure that both sides of any steps have handrails.  Any raised decks and porches should have guardrails on the edges.  Have any leaves, snow, or ice cleared regularly.  Use sand or salt on walking paths during winter.  Clean up any spills in your garage right away. This includes oil or grease spills. What can I do in the bathroom?  Use night lights.  Install grab bars by the toilet and in the tub and shower. Do not use towel bars as grab bars.  Use  non-skid mats or decals in the tub or shower.  If you need to sit down in the shower, use a plastic, non-slip stool.  Keep the floor dry. Clean up any water that spills on the floor as soon as it happens.  Remove soap buildup in the tub or shower regularly.  Attach bath mats securely with double-sided non-slip rug tape.  Do not have throw rugs and other things on the floor that can make you trip. What can I do in the bedroom?  Use night lights.  Make sure that you have a light by your bed that is easy to reach.  Do not use any sheets or blankets that are too big for your bed. They should not hang down onto the floor.  Have a firm chair that has side arms. You can use this for support while you get dressed.  Do not have throw rugs and other things on the floor that can make you trip. What can I do in the kitchen?  Clean up any spills right away.  Avoid walking on wet floors.  Keep items that you use a lot in easy-to-reach places.  If you need to reach something above you, use a strong step stool  that has a grab bar.  Keep electrical cords out of the way.  Do not use floor polish or wax that makes floors slippery. If you must use wax, use non-skid floor wax.  Do not have throw rugs and other things on the floor that can make you trip. What can I do with my stairs?  Do not leave any items on the stairs.  Make sure that there are handrails on both sides of the stairs and use them. Fix handrails that are broken or loose. Make sure that handrails are as long as the stairways.  Check any carpeting to make sure that it is firmly attached to the stairs. Fix any carpet that is loose or worn.  Avoid having throw rugs at the top or bottom of the stairs. If you do have throw rugs, attach them to the floor with carpet tape.  Make sure that you have a light switch at the top of the stairs and the bottom of the stairs. If you do not have them, ask someone to add them for you. What  else can I do to help prevent falls?  Wear shoes that:  Do not have high heels.  Have rubber bottoms.  Are comfortable and fit you well.  Are closed at the toe. Do not wear sandals.  If you use a stepladder:  Make sure that it is fully opened. Do not climb a closed stepladder.  Make sure that both sides of the stepladder are locked into place.  Ask someone to hold it for you, if possible.  Clearly mark and make sure that you can see:  Any grab bars or handrails.  First and last steps.  Where the edge of each step is.  Use tools that help you move around (mobility aids) if they are needed. These include:  Canes.  Walkers.  Scooters.  Crutches.  Turn on the lights when you go into a dark area. Replace any light bulbs as soon as they burn out.  Set up your furniture so you have a clear path. Avoid moving your furniture around.  If any of your floors are uneven, fix them.  If there are any pets around you, be aware of where they are.  Review your medicines with your doctor. Some medicines can make you feel dizzy. This can increase your chance of falling. Ask your doctor what other things that you can do to help prevent falls. This information is not intended to replace advice given to you by your health care provider. Make sure you discuss any questions you have with your health care provider. Document Released: 12/20/2008 Document Revised: 08/01/2015 Document Reviewed: 03/30/2014 Elsevier Interactive Patient Education  2017 [employer].

## 2019-06-06 NOTE — Progress Notes (Signed)
This visit is being conducted via phone call due to the COVID-19 pandemic. This patient has given me verbal consent via phone to conduct this visit, patient states they are participating from their home address. Some vital signs may be absent or patient reported.   Patient identification: identified by name, DOB, and current address.  Location provider: [facility], Office Persons participating in the virtual visit: Denman Jevante LPN, patient, and Dr. Garret Reddish    Subjective:   [name] is a 86 y.o. male who presents for Medicare Annual/Subsequent preventive examination.  Review of Systems:   Cardiac Risk Factors include: male gender;hypertension;dyslipidemia;advanced age (>60men, >8 women)    Objective:    Vitals: There were no vitals taken for this visit.  There is no height or weight on file to calculate BMI.  Advanced Directives 06/06/2019 11/29/2018 07/26/2018 11/16/2017 11/07/2017 07/24/2017 02/01/2017  Does Patient Have a Medical Advance Directive? Yes Yes Yes No No Yes Yes  Type of Advance Directive Living will;Healthcare Power of [place];Living will [place];Living will - - [place];Living will Living will  Does patient want to make changes to medical advance directive? No - Patient declined No - Patient declined No - Patient declined - - No - Patient declined No - Patient declined  Copy of [place] in Chart? No - copy requested No - copy requested - - - Yes -  Would patient like information on creating a medical advance directive? - - - No - Patient declined No - Patient declined - -  Pre-existing out of facility DNR order (yellow form or pink MOST form) - - - - - - -    Tobacco Social History   Tobacco Use  Smoking Status Former Smoker  . Packs/day: 1.00  . Years: 40.00  . Pack years: 40.00  . Types: Cigarettes  . Quit date: 03/10/1983  . Years since quitting: 36.2    Smokeless Tobacco Never Used     Counseling given: Not Answered   Clinical Intake:  Pre-visit preparation completed: Yes  Pain : No/denies pain  Diabetes: No  How often do you need to have someone help you when you read instructions, pamphlets, or other written materials from your doctor or pharmacy?: 1 - Never  Interpreter Needed?: No  Information entered by :: Denman Colbin LPN  Past Medical History:  Diagnosis Date  . Arthritis    "back" (03/20/2014)  . Atrial fibrillation ([place])   . BPH (benign prostatic hypertrophy)   . Breast mass    "on both sides" (06/28/2012)  . Cardiomyopathy primary-nonischemic EF 45%  . Carotid artery occlusion   . CHF (congestive heart failure) ([place])   . Coronary artery disease   . Diverticulosis of colon without hemorrhage 01/03/2014  . Dysphagia   . Elevated liver enzymes   . Esophageal stricture   . GERD (gastroesophageal reflux disease)   . Hearing loss, mixed, bilateral   . Hiatal hernia   . Hx of cardiovascular stress test    Adenosine Myoview (07/2013):  Apical cap, apical lateral and mid anterolateral scar, small amount of peri-infarct ischemia, EF 36%; Medium Risk  . Hyperlipidemia   . Increased prostate specific antigen (PSA) velocity   . Internal hemorrhoids 01/03/2014  . Lumbar back pain   . Pacemaker    st jude  . 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 Polymyalgia rheumatica ([place])   . Sick sinus syndrome ([place])  Past Surgical History:  Procedure Laterality Date  . AV NODE ABLATION N/A 04/20/2016   Procedure: AV Node Ablation;  Surgeon: Evans Lance, MD;  Location: [facility];  Service: Cardiovascular;  Laterality: N/A;  . CARDIAC CATHETERIZATION  1980's   [place]  . CARDIOVERSION  06/17/2011   Procedure: CARDIOVERSION;  Surgeon: Lelon Perla, MD;  Location: [facility];  Service: Cardiovascular;  Laterality: N/A;  . CARDIOVERSION  09/09/2011   Procedure: CARDIOVERSION;  Surgeon: Carlena Bjornstad, MD;  Location:  [place];  Service: Cardiovascular;  Laterality: N/A;  . CARDIOVERSION  01/01/2012   Procedure: CARDIOVERSION;  Surgeon: Hillary Bow, MD;  Location: [facility] ENDOSCOPY;  Service: Cardiovascular;  Laterality: N/A;  . CARDIOVERSION N/A 08/03/2014   Procedure: CARDIOVERSION;  Surgeon: Larey Dresser, MD;  Location: [facility] ENDOSCOPY;  Service: Cardiovascular;  Laterality: N/A;  . CARDIOVERSION N/A 11/07/2015   Procedure: CARDIOVERSION;  Surgeon: Larey Dresser, MD;  Location: [facility] ENDOSCOPY;  Service: Cardiovascular;  Laterality: N/A;  . CARDIOVERSION N/A 11/20/2015   Procedure: CARDIOVERSION;  Surgeon: Larey Dresser, MD;  Location: [place];  Service: Cardiovascular;  Laterality: N/A;  . CAROTID ENDARTERECTOMY    . CATARACT EXTRACTION, BILATERAL Bilateral 1980's  . COLONOSCOPY N/A 01/03/2014   Procedure: COLONOSCOPY;  Surgeon: Gatha Mayer, MD;  Location: [facility] ENDOSCOPY;  Service: Endoscopy;  Laterality: N/A;  . ENDARTERECTOMY Right 07/26/2017   Procedure: RIGHT CAROTID  ARTERY ENDARTERECTOMY;  Surgeon: Rosetta Posner, MD;  Location: [facility] OR;  Service: Vascular;  Laterality: Right;  . ESOPHAGOGASTRODUODENOSCOPY    . GENERATOR REMOVAL Left 06/15/2012   Procedure: GENERATOR REMOVAL;  Surgeon: Evans Lance, MD;  Location: [place];  Service: Cardiovascular;  Laterality: Left;  . HEMORRHOID BANDING    . INGUINAL HERNIA REPAIR Right 2012; 03/20/2014  . INGUINAL HERNIA REPAIR Right 03/20/2014   Procedure: OPEN REPAIR OF RIGHT INGUINAL WITH MESH;  Surgeon: Donnie Mesa, MD;  Location: [place];  Service: General;  Laterality: Right;  . INSERT / REPLACE / REMOVE PACEMAKER    . INSERTION OF MESH Right 03/20/2014   Procedure: INSERTION OF MESH;  Surgeon: Donnie Mesa, MD;  Location: [place];  Service: General;  Laterality: Right;  . KNEE ARTHROSCOPY WITH LATERAL MENISECTOMY Left 12/16/2016   Procedure: KNEE ARTHROSCOPY WITH LATERAL MENISECTOMY and Chondroplasty;  Surgeon: Earlie Server, MD;  Location: [place];  Service:  Orthopedics;  Laterality: Left;  . LEAD REVISION N/A 06/29/2012   Procedure: LEAD REVISION;  Surgeon: Deboraha Sprang, MD;  Location: [facility] CATH LAB;  Service: Cardiovascular;  Laterality: N/A;  . PACEMAKER GENERATOR CHANGE N/A 06/15/2012   Procedure: PACEMAKER GENERATOR CHANGE;  Surgeon: Evans Lance, MD;  Location: [place];  Service: Cardiovascular;  Laterality: N/A;  . PACEMAKER REVISION  06/30/2012   Procedure: PACEMAKER LEAD REVISION;  Surgeon: Evans Lance, MD;  Location: [facility] CATH LAB;  Service: Cardiovascular;;  . PARACENTESIS  ~ 0000000   complication from pacer change in 2014  . PATCH ANGIOPLASTY Right 07/26/2017   Procedure: WITH PATCH ANGIOPLASTY;  Surgeon: Rosetta Posner, MD;  Location: [place];  Service: Vascular;  Laterality: Right;  . PERMANENT PACEMAKER INSERTION N/A 06/28/2012   Procedure: PERMANENT PACEMAKER INSERTION;  Surgeon: Evans Lance, MD;  Location: [facility] CATH LAB;  Service: Cardiovascular;  Laterality: N/A;  . TEE WITHOUT CARDIOVERSION  01/01/2012   Procedure: TRANSESOPHAGEAL ECHOCARDIOGRAM (TEE);  Surgeon: Fay Records, MD;  Location: [place];  Service: Cardiovascular;  Laterality: N/A;  .  TEE WITHOUT CARDIOVERSION N/A 08/03/2014   Procedure: TRANSESOPHAGEAL ECHOCARDIOGRAM (TEE);  Surgeon: Larey Dresser, MD;  Location: [place];  Service: Cardiovascular;  Laterality: N/A;  . TEE WITHOUT CARDIOVERSION N/A 11/07/2015   Procedure: TRANSESOPHAGEAL ECHOCARDIOGRAM (TEE);  Surgeon: Larey Dresser, MD;  Location: [place];  Service: Cardiovascular;  Laterality: N/A;  . TONSILLECTOMY AND ADENOIDECTOMY  1938  . UMBILICAL HERNIA REPAIR  [phone] X 3   Family History  Problem Relation Age of Onset  . Heart attack Father 19       deceased  . AAA (abdominal aortic aneurysm) Mother        deceased AAA  . Hypertension Mother   . Other Brother        deceased at birth  . Healthy Son   . Healthy Daughter   . Diabetes Cousin    Social History   Socioeconomic History  .  Marital status: Widowed    Spouse name: [name]  . Number of children: 3  . Years of education: 16  . Highest education level: Bachelor's degree (e.g., BA, AB, BS)  Occupational History  . Occupation: retired    Fish farm manager: RETIRED    Comment: Management [employer]  Tobacco Use  . Smoking status: Former Smoker    Packs/day: 1.00    Years: 40.00    Pack years: 40.00    Types: Cigarettes    Quit date: 03/10/1983    Years since quitting: 36.2  . Smokeless tobacco: Never Used  Substance and Sexual Activity  . Alcohol use: No    Alcohol/week: 0.0 standard drinks    Comment: quit drinking 2000-? alcohol related cardiomyopathy  . Drug use: No  . Sexual activity: Not Currently  Other Topics Concern  . Not on file  Social History Narrative   Married. 3 children (6 together). 5 grandkids with with 5 grandkids (2nd marriage). No greatgrandkids.    One story home. 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 Difficulty of Paying Living Expenses: Not hard at all  Food Insecurity: No Food Insecurity  . Worried About Charity fundraiser in the Last Year: Never true  . Ran Out of Food in the Last Year: Never true  Transportation Needs: No Transportation Needs  . Lack of Transportation (Medical): No  . Lack of Transportation (Non-Medical): No  Physical Activity: Insufficiently Active  . Days of Exercise per Week: 4 days  . Minutes of Exercise per Session: 20 min  Stress: No Stress Concern Present  . Feeling of Stress : Only a little  Social Connections: Slightly Isolated  . Frequency of Communication with Friends and Family: More than three times a week  . Frequency of Social Gatherings with Friends and Family: More than three times a week  . Attends Religious Services: More than 4  times per year  . Active Member of Clubs or Organizations: No  . Attends Archivist Meetings: Never  . Marital Status: Married    Outpatient Encounter Medications as of 06/06/2019  Medication Sig  . acetaminophen (TYLENOL) 500 MG tablet Take 1,000 mg by mouth every 6 (six) hours as needed for moderate pain or headache.  . dutasteride (AVODART) 0.5 MG capsule Take 0.5 mg by mouth daily.   Marland Kitchen ELIQUIS 5 MG TABS tablet TAKE 1 TABLET  BY MOUTH TWICE A DAY  . famotidine (PEPCID) 20 MG tablet Take 20 mg by mouth 2 (two) times daily as needed for heartburn or indigestion.  . fluticasone (FLONASE) 50 MCG/ACT nasal spray Place 2 sprays into both nostrils daily as needed for allergies or rhinitis.  . furosemide (LASIX) 80 MG tablet Take 80mg  in the AM and 40mg  in the PM  . hydroxypropyl methylcellulose / hypromellose (ISOPTO TEARS / GONIOVISC) 2.5 % ophthalmic solution Place 1 drop into both eyes 3 (three) times daily as needed for dry eyes.   Marland Kitchen losartan (COZAAR) 25 MG tablet Take 0.5 tablets (12.5 mg total) by mouth daily.  . metoprolol succinate (TOPROL-XL) 25 MG 24 hr tablet Take 1 tablet (25 mg total) by mouth 2 (two) times daily.  . rosuvastatin (CRESTOR) 10 MG tablet TAKE 1 TABLET BY MOUTH EVERYDAY AT BEDTIME  . spironolactone (ALDACTONE) 25 MG tablet Take 25 mg by mouth daily.  . tamsulosin (FLOMAX) 0.4 MG CAPS capsule Take 0.4 mg by mouth 2 (two) times daily.   Marland Kitchen triamcinolone cream (KENALOG) 0.1 % Apply 1 application topically 2 (two) times daily. For 7-10 days maximum  . Wheat Dextrin (BENEFIBER DRINK MIX PO) Take by mouth. 2 tsps daily   No facility-administered encounter medications on file as of 06/06/2019.    Activities of Daily Living In your present state of health, do you have any difficulty performing the following activities: 06/06/2019  Hearing? N  Vision? N  Difficulty concentrating or making decisions? N  Walking or climbing stairs? N  Dressing or bathing? N  Doing  errands, shopping? N  Preparing Food and eating ? N  Using the Toilet? N  In the past six months, have you accidently leaked urine? N  Do you have problems with loss of bowel control? N  Managing your Medications? N  Managing your Finances? N  Housekeeping or managing your Housekeeping? N  Some recent data might be hidden    Patient Care Team: [name], MD as PCP - General (Family Medicine) Grace Isaac, MD as Consulting Physician (Cardiothoracic Surgery) Larey Dresser, MD as Consulting Physician (Cardiology) Collene Gobble, MD as Consulting Physician (Pulmonary Disease) Evans Lance, MD as Consulting Physician (Cardiology) Alda Berthold, DO as Consulting Physician (Neurology) Gatha Mayer, MD as Consulting Physician (Gastroenterology) Regal, Tamala Fothergill, DPM as Consulting Physician (Podiatry) Luberta Mutter, MD as Consulting Physician (Ophthalmology)   Assessment:   This is a routine wellness examination for [name].  Exercise Activities and Dietary recommendations Current Exercise Habits: Home exercise routine, Type of exercise: walking;Other - see comments(golfing), Time (Minutes): 30, Frequency (Times/Week): 4, Weekly Exercise (Minutes/Week): 120, Intensity: Mild  Goals   None     Fall Risk Fall Risk  06/06/2019 11/29/2018 08/17/2018 06/03/2018 09/20/2017  Falls in the past year? 0 0 0 0 No  Number falls in past yr: 0 0 0 0 -  Injury with Fall? 0 0 0 0 -  Risk for fall due to : Impaired mobility Other (Comment) - - -  Risk for fall due to: Comment - Single Leg Stand Test: 1.12 sec. - - -  Follow up Education provided;Falls prevention discussed;Falls evaluation completed Falls evaluation completed Falls evaluation completed - -   Is the patient's home free of loose throw rugs in walkways, pet beds, electrical cords, etc?   yes      Grab bars in the bathroom? yes      Handrails on the stairs?  yes      Adequate lighting?   yes  Depression  Screen PHQ 2/9 Scores 06/06/2019 05/01/2019 01/20/2019 11/29/2018  PHQ - 2 Score 0 1 1 1     Cognitive Function MMSE - Mini Mental State Exam 06/03/2018  Orientation to time 5  Orientation to Place 5  Registration 3  Attention/ Calculation 5  Recall 3  Language- name 2 objects 2  Language- repeat 1  Language- follow 3 step command 3  Language- read & follow direction 1  Write a sentence 1  Copy design 1  Total score 30     6CIT Screen 06/06/2019  What Year? 0 points  What month? 0 points  What time? 0 points  Count back from 20 0 points  Months in reverse 0 points  Repeat phrase 0 points  Total Score 0    Immunization History  Administered Date(s) Administered  . Influenza Split 12/25/2010, 12/08/2011, 12/07/2012  . Influenza Whole 01/07/2007, 11/29/2007, 12/10/2008, 12/16/2009  . Influenza, High Dose Seasonal PF 12/05/2015, 12/17/2016, 12/15/2017  . Influenza,inj,Quad PF,6+ Mos 12/04/2014, 12/22/2018  . Influenza,inj,quad, With Preservative 12/07/2016  . Influenza-Unspecified 12/23/2013, 12/09/2016, 12/15/2017  . Moderna SARS-COVID-2 Vaccination 04/18/2019  . Pneumococcal Conjugate-13 02/14/2014  . Pneumococcal Polysaccharide-23 11/10/2003, 12/25/2010  . Td 11/10/2003  . Tetanus 02/14/2014  . Zoster 03/09/2008  . Zoster Recombinat (Shingrix) 12/23/2017, 04/08/2018    Qualifies for Shingles Vaccine?  Shingrix completed   Screening Tests Health Maintenance  Topic Date Due  . TETANUS/TDAP  02/15/2024  . INFLUENZA VACCINE  Completed  . PNA vac Low Risk Adult  Completed   Cancer Screenings: Lung: Low Dose CT Chest recommended if Age 68-80 years, 30 pack-year currently smoking OR have quit w/in 15years. Patient does not qualify. Colorectal: colonoscopy 01/03/14; no longer indicated      Plan:  I have personally reviewed and addressed the Medicare Annual Wellness questionnaire and have noted the following in the patient's chart:  A. Medical and social  history B. Use of alcohol, tobacco or illicit drugs  C. Current medications and supplements D. Functional ability and status E.  Nutritional status F.  Physical activity G. Advance directives H. List of other physicians I.  Hospitalizations, surgeries, and ER visits in previous 12 months J.  [place] such as hearing and vision if needed, cognitive and depression L. Referrals, records requested, and appointments- none   In addition, I have reviewed and discussed with patient certain preventive protocols, quality metrics, and best practice recommendations. A written personalized care plan for preventive services as well as general preventive health recommendations were provided to patient.   Signed,  Denman Race, LPN  Nurse Health Advisor   Nurse Notes: no additional

## 2019-06-08 ENCOUNTER — Other Ambulatory Visit: Payer: Self-pay

## 2019-06-08 MED ORDER — FLUTICASONE PROPIONATE 50 MCG/ACT NA SUSP: 2.0000 | Freq: Every day | NASAL | 3 refills | Status: DC | PRN

## 2019-06-08 NOTE — Telephone Encounter (Signed)
Refill sent to pharmacy.   

## 2019-06-13 ENCOUNTER — Other Ambulatory Visit: Payer: Self-pay

## 2019-06-13 ENCOUNTER — Other Ambulatory Visit (HOSPITAL_COMMUNITY): Payer: Self-pay | Admitting: *Deleted

## 2019-06-13 MED ORDER — FAMOTIDINE 20 MG PO TABS: 20.0000 mg | ORAL_TABLET | Freq: Two times a day (BID) | ORAL | 1 refills | Status: DC | PRN

## 2019-06-13 MED ORDER — APIXABAN 5 MG PO TABS: 5.0000 mg | ORAL_TABLET | Freq: Two times a day (BID) | ORAL | 3 refills | Status: DC

## 2019-06-13 MED ORDER — ROSUVASTATIN CALCIUM 10 MG PO TABS: ORAL_TABLET | ORAL | 3 refills | Status: DC

## 2019-06-13 NOTE — Telephone Encounter (Signed)
Refill request received for Famotidine. Never given by our office ok to refill?

## 2019-06-18 ENCOUNTER — Other Ambulatory Visit: Payer: Self-pay | Admitting: Family Medicine

## 2019-06-22 ENCOUNTER — Other Ambulatory Visit: Payer: Self-pay

## 2019-06-22 ENCOUNTER — Telehealth (INDEPENDENT_AMBULATORY_CARE_PROVIDER_SITE_OTHER): Payer: Medicare Other | Admitting: Neurology

## 2019-06-22 DIAGNOSIS — G629 Polyneuropathy, unspecified: Secondary | ICD-10-CM

## 2019-06-22 NOTE — Progress Notes (Signed)
   Virtual Visit via Video Note The purpose of this virtual visit is to provide medical care while limiting exposure to the novel coronavirus.    Consent was obtained for video visit:  Yes.   Answered questions that patient had about telehealth interaction:  Yes.   I discussed the limitations, risks, security and privacy concerns of performing an evaluation and management service by telemedicine. I also discussed with the patient that there may be a patient responsible charge related to this service. The patient expressed understanding and agreed to proceed.  Pt location: Home Physician Location: office Name of referring provider:  [name], MD I connected with [name] at patients initiation/request on 06/22/2019 at 10:10 AM EDT by video enabled telemedicine application and verified that I am speaking with the correct person using two identifiers. Pt MRN:  [id number] Pt DOB:  02/12/1930 Video Participants:  [name]   History of Present Illness: This is a 86 y.o. male returning for follow-up of idiopathic peripheral neuropathy.  Overall, he is doing well with respect to his neuropathy and he has not noticed any significant progression.  Numbness remains restricted to below the mid-calf.  He denies painful paresthesias.  He has some imbalance, but manages very well unassisted and has not suffered any falls.  He stays active at [employer] and with water aerobics.  Unfortunately, his wife passed away in 2019-05-23.    Observations/Objective:   Patient is awake, alert, and appears comfortable.  Oriented x 4.   Extraocular muscles are intact. No ptosis.  Face is symmetric.  Speech is not dysarthric.  Antigravity in all extremities.  No pronator drift. Gait appears mildly wide-based, stable unassisted.   Assessment and Plan:  Idiopathic peripheral neuropathy manifesting with numbness and sensory ataxia, stable.    - Continue supportive care with fall  prevention, especially being on anticoagulation therapy  - He was encouraged to use shower chair   - Check feet daily  - Use cane on uneven ground   Follow Up Instructions:   I discussed the assessment and treatment plan with the patient. The patient was provided an opportunity to ask questions and all were answered. The patient agreed with the plan and demonstrated an understanding of the instructions.   The patient was advised to call back or seek an in-person evaluation if the symptoms worsen or if the condition fails to improve as anticipated.  Follow-up in 1 year   Alda Berthold, DO

## 2019-06-26 ENCOUNTER — Encounter: Payer: Self-pay | Admitting: Internal Medicine

## 2019-06-26 ENCOUNTER — Other Ambulatory Visit: Payer: Self-pay

## 2019-06-26 ENCOUNTER — Ambulatory Visit (INDEPENDENT_AMBULATORY_CARE_PROVIDER_SITE_OTHER): Payer: Medicare Other | Admitting: Internal Medicine

## 2019-06-26 VITALS — BP 80/40 | HR 76 | Temp 97.4°F | Ht 66.0 in | Wt 155.2 lb

## 2019-06-26 DIAGNOSIS — K641 Second degree hemorrhoids: Secondary | ICD-10-CM

## 2019-06-26 DIAGNOSIS — R151 Fecal smearing: Secondary | ICD-10-CM

## 2019-06-26 DIAGNOSIS — Z7901 Long term (current) use of anticoagulants: Secondary | ICD-10-CM

## 2019-06-26 MED ORDER — FLUTICASONE PROPIONATE 50 MCG/ACT NA SUSP: 2.0000 | Freq: Every day | NASAL | 3 refills | Status: DC | PRN

## 2019-06-26 NOTE — Progress Notes (Signed)
[name] 86 y.o. 13-Dec-1929 [id number]  Assessment & Plan:   Encounter Diagnoses  Name Primary?   Prolapsed internal hemorrhoids, grade 2 - causing fecal seepage Yes   Chronic anticoagulation    Fecal smearing      Prolapsed internal hemorrhoids, grade 2 - causing fecal seepage RP banded - on eliquis (new protocol leave on anti-coag and do one at a time) RTC May 10 more banding if needed  Cc;Hunter, Brayton Mars, MD   Subjective:   Chief Complaint: fecal smearing  HPI He has persistent fecal smearing - about 1-2 x a week that is bothersome. Often after a larger BM. Tried pelvic PT and prior hemorrhoid banding that have helped but things may have regressed somewhat. Took Benefiber but he could not tell any difference. Last banding of hemorrhoids was 04/2018. He is interested in pursuing additional banding if it could help. No rectal bleeding. Stools often but not always narrow caliber.  Allergies  Allergen Reactions   Antihistamines, Diphenhydramine-Type Other (See Comments)    Causes difficulty in ability to urinate.   Current Meds  Medication Sig   acetaminophen (TYLENOL) 500 MG tablet Take 1,000 mg by mouth every 6 (six) hours as needed for moderate pain or headache.   apixaban (ELIQUIS) 5 MG TABS tablet Take 1 tablet (5 mg total) by mouth 2 (two) times daily.   dutasteride (AVODART) 0.5 MG capsule Take 0.5 mg by mouth daily.    famotidine (PEPCID) 20 MG tablet Take 20 mg by mouth 2 (two) times daily.   fluticasone (FLONASE) 50 MCG/ACT nasal spray Place 2 sprays into both nostrils daily as needed for allergies or rhinitis.   furosemide (LASIX) 80 MG tablet Take 80mg  in the AM and 40mg  in the PM   hydroxypropyl methylcellulose / hypromellose (ISOPTO TEARS / GONIOVISC) 2.5 % ophthalmic solution Place 1 drop into both eyes 3 (three) times daily as needed for dry eyes.    losartan (COZAAR) 25 MG tablet Take 0.5 tablets (12.5 mg total) by mouth daily.    metoprolol succinate (TOPROL-XL) 25 MG 24 hr tablet Take 1 tablet (25 mg total) by mouth 2 (two) times daily. (Patient taking differently: Take 25 mg by mouth daily. )   rosuvastatin (CRESTOR) 10 MG tablet TAKE 1 TABLET BY MOUTH EVERYDAY AT BEDTIME   spironolactone (ALDACTONE) 25 MG tablet Take 1 tablet (25 mg total) by mouth daily.   tamsulosin (FLOMAX) 0.4 MG CAPS capsule Take 0.4 mg by mouth 2 (two) times daily.    triamcinolone cream (KENALOG) 0.1 % Apply 1 application topically 2 (two) times daily. For 7-10 days maximum   Past Medical History:  Diagnosis Date   Arthritis    "back" (03/20/2014)   Atrial fibrillation (HCC)    BPH (benign prostatic hypertrophy)    Breast mass    "on both sides" (06/28/2012)   Cardiomyopathy primary-nonischemic EF 45%   Carotid artery occlusion    CHF (congestive heart failure) ([place])    Coronary artery disease    Diverticulosis of colon without hemorrhage 01/03/2014   Dysphagia    Elevated liver enzymes    Esophageal stricture    GERD (gastroesophageal reflux disease)    Hearing loss, mixed, bilateral    Hiatal hernia    Hx of cardiovascular stress test    Adenosine Myoview (07/2013):  Apical cap, apical lateral and mid anterolateral scar, small amount of peri-infarct ischemia, EF 36%; Medium Risk   Hyperlipidemia    Increased prostate specific antigen (PSA)  velocity    Internal hemorrhoids 01/03/2014   Lumbar back pain    Pacemaker    st jude   Pacemaker infection ([place])    06/28/12   Polymyalgia rheumatica ([place])    Sick sinus syndrome [facility])    Past Surgical History:  Procedure Laterality Date   AV NODE ABLATION N/A 04/20/2016   Procedure: AV Node Ablation;  Surgeon: Evans Lance, MD;  Location: [place] [facility];  Service: Cardiovascular;  Laterality: N/A;   BASAL CELL CARCINOMA EXCISION  2021   ear   CARDIAC CATHETERIZATION  1980's   [place]   CARDIOVERSION  06/17/2011   Procedure: CARDIOVERSION;   Surgeon: Lelon Perla, MD;  Location: [place];  Service: Cardiovascular;  Laterality: N/A;   CARDIOVERSION  09/09/2011   Procedure: CARDIOVERSION;  Surgeon: Carlena Bjornstad, MD;  Location: [place];  Service: Cardiovascular;  Laterality: N/A;   CARDIOVERSION  01/01/2012   Procedure: CARDIOVERSION;  Surgeon: Hillary Bow, MD;  Location: [facility] ENDOSCOPY;  Service: Cardiovascular;  Laterality: N/A;   CARDIOVERSION N/A 08/03/2014   Procedure: CARDIOVERSION;  Surgeon: Larey Dresser, MD;  Location: [facility] ENDOSCOPY;  Service: Cardiovascular;  Laterality: N/A;   CARDIOVERSION N/A 11/07/2015   Procedure: CARDIOVERSION;  Surgeon: Larey Dresser, MD;  Location: [facility] ENDOSCOPY;  Service: Cardiovascular;  Laterality: N/A;   CARDIOVERSION N/A 11/20/2015   Procedure: CARDIOVERSION;  Surgeon: Larey Dresser, MD;  Location: [place];  Service: Cardiovascular;  Laterality: N/A;   CAROTID ENDARTERECTOMY     CATARACT EXTRACTION, BILATERAL Bilateral 1980's   COLONOSCOPY N/A 01/03/2014   Procedure: COLONOSCOPY;  Surgeon: Gatha Mayer, MD;  Location: [facility] ENDOSCOPY;  Service: Endoscopy;  Laterality: N/A;   ENDARTERECTOMY Right 07/26/2017   Procedure: RIGHT CAROTID  ARTERY ENDARTERECTOMY;  Surgeon: Rosetta Posner, MD;  Location: [place];  Service: Vascular;  Laterality: Right;   ESOPHAGOGASTRODUODENOSCOPY     GENERATOR REMOVAL Left 06/15/2012   Procedure: GENERATOR REMOVAL;  Surgeon: Evans Lance, MD;  Location: [place];  Service: Cardiovascular;  Laterality: Left;   HEMORRHOID BANDING     INGUINAL HERNIA REPAIR Right 2012; 03/20/2014   INGUINAL HERNIA REPAIR Right 03/20/2014   Procedure: OPEN REPAIR OF RIGHT INGUINAL WITH MESH;  Surgeon: Donnie Mesa, MD;  Location: [place];  Service: General;  Laterality: Right;   INSERT / REPLACE / REMOVE PACEMAKER     INSERTION OF MESH Right 03/20/2014   Procedure: INSERTION OF MESH;  Surgeon: Donnie Mesa, MD;  Location: [place];  Service: General;  Laterality: Right;    KNEE ARTHROSCOPY WITH LATERAL MENISECTOMY Left 12/16/2016   Procedure: KNEE ARTHROSCOPY WITH LATERAL MENISECTOMY and Chondroplasty;  Surgeon: Earlie Server, MD;  Location: [place];  Service: Orthopedics;  Laterality: Left;   LEAD REVISION N/A 06/29/2012   Procedure: LEAD REVISION;  Surgeon: Deboraha Sprang, MD;  Location: [facility] CATH LAB;  Service: Cardiovascular;  Laterality: N/A;   MELANOMA EXCISION Right 2020   arm   PACEMAKER GENERATOR CHANGE N/A 06/15/2012   Procedure: PACEMAKER GENERATOR CHANGE;  Surgeon: Evans Lance, MD;  Location: [place];  Service: Cardiovascular;  Laterality: N/A;   PACEMAKER REVISION  06/30/2012   Procedure: PACEMAKER LEAD REVISION;  Surgeon: Evans Lance, MD;  Location: [facility] CATH LAB;  Service: Cardiovascular;;   PARACENTESIS  ~ 0000000   complication from pacer change in 2014   [place] Right 07/26/2017   Procedure: [place];  Surgeon: Rosetta Posner, MD;  Location: [place];  Service: Vascular;  Laterality: Right;   PERMANENT PACEMAKER INSERTION N/A 06/28/2012   Procedure: PERMANENT PACEMAKER INSERTION;  Surgeon: Evans Lance, MD;  Location: [facility] CATH LAB;  Service: Cardiovascular;  Laterality: N/A;   TEE WITHOUT CARDIOVERSION  01/01/2012   Procedure: TRANSESOPHAGEAL ECHOCARDIOGRAM (TEE);  Surgeon: Fay Records, MD;  Location: [facility] ENDOSCOPY;  Service: Cardiovascular;  Laterality: N/A;   TEE WITHOUT CARDIOVERSION N/A 08/03/2014   Procedure: TRANSESOPHAGEAL ECHOCARDIOGRAM (TEE);  Surgeon: Larey Dresser, MD;  Location: [place];  Service: Cardiovascular;  Laterality: N/A;   TEE WITHOUT CARDIOVERSION N/A 11/07/2015   Procedure: TRANSESOPHAGEAL ECHOCARDIOGRAM (TEE);  Surgeon: Larey Dresser, MD;  Location: [place];  Service: Cardiovascular;  Laterality: N/A;   TONSILLECTOMY AND ADENOIDECTOMY  [id number]   UMBILICAL HERNIA REPAIR  [phone] X 3   Social History   Social History Narrative   Married. 3 children (6 together). 5 grandkids with  with 5 grandkids (2nd marriage). No greatgrandkids.    One story home. Lives in [place]    Retired from [employer]   Education: college   Hobbies: play golf, bridge, garden, write family stories   family history includes AAA (abdominal aortic aneurysm) in his mother; Diabetes in his cousin; Healthy in his daughter and son; Heart attack (age of onset: 67) in his father; Hypertension in his mother; Other in his brother.   Review of Systems As above  Objective:   Physical Exam BP (!) 80/40    Pulse 76    Temp (!) 97.4 F (36.3 C)    Ht 5\' 6"  (1.676 m)    Wt 155 lb 3.2 oz (70.4 kg)    BMI 25.05 kg/m  Spry, younger than stated age  Quintin Alto, CMA present  Rectal  Anoderm - small amount of stool - soft and brown, on skin with some erythema. DRE good resting and voluntary anal tone. No mass, formed, soft vrown stool. Non-tender. No visible hemorrhoid prolapse with valsalva   Anoscopy - Gr 2 internal hemorrhoids all positions  PROCEDURE NOTE: The patient presents with symptomatic grade 2  hemorrhoids, requesting rubber band ligation of his/her hemorrhoidal disease.  All risks, benefits and alternative forms of therapy were described and informed consent was obtained.   The anorectum was pre-medicated with 0.125% NTG and 5% liodcaineThe decision was made to band the RP internal hemorrhoid, and the [place] was used to perform band ligation without complication.  Digital anorectal examination was then performed to assure proper positioning of the band, and to adjust the banded tissue as required.  The patient was discharged home without pain or other issues.  Dietary and behavioral recommendations were given and along with follow-up instructions.      No complications were encountered and the patient tolerated the procedure well.

## 2019-06-26 NOTE — Patient Instructions (Signed)
HEMORRHOID BANDING PROCEDURE    FOLLOW-UP CARE   1. The procedure you have had should have been relatively painless since the banding of the area involved does not have nerve endings and there is no pain sensation.  The rubber band cuts off the blood supply to the hemorrhoid and the band may fall off as soon as 48 hours after the banding (the band may occasionally be seen in the toilet bowl following a bowel movement). You may notice a temporary feeling of fullness in the rectum which should respond adequately to plain Tylenol or Motrin.  2. Following the banding, avoid strenuous exercise that evening and resume full activity the next day.  A sitz bath (soaking in a warm tub) or bidet is soothing, and can be useful for cleansing the area after bowel movements.     3. To avoid constipation, take two tablespoons of natural wheat bran, natural oat bran, flax, Benefiber or any over the counter fiber supplement and increase your water intake to 7-8 glasses daily.    4. Unless you have been prescribed anorectal medication, do not put anything inside your rectum for two weeks: No suppositories, enemas, fingers, etc.  5. Occasionally, you may have more bleeding than usual after the banding procedure.  This is often from the untreated hemorrhoids rather than the treated one.  Don't be concerned if there is a tablespoon or so of blood.  If there is more blood than this, lie flat with your bottom higher than your head and apply an ice pack to the area. If the bleeding does not stop within a half an hour or if you feel faint, call our office at [phone] or go to the emergency room.  6. Problems are not common; however, if there is a substantial amount of bleeding, severe pain, chills, fever or difficulty passing urine (very rare) or other problems, you should call us at [phone] or report to the nearest emergency room.  7. Do not stay seated continuously for more than 2-3 hours for a day or two  after the procedure.  Tighten your buttock muscles 10-15 times every two hours and take 10-15 deep breaths every 1-2 hours.  Do not spend more than a few minutes on the toilet if you cannot empty your bowel; instead re-visit the toilet at a later time.    If you are age 48 or older, your body mass index should be between 23-30. Your Body mass index is 25.05 kg/m. If this is out of the aforementioned range listed, please consider follow up with your Primary Care Provider.  If you are age 72 or younger, your body mass index should be between 19-25. Your Body mass index is 25.05 kg/m. If this is out of the aformentioned range listed, please consider follow up with your Primary Care Provider.    Thank you for choosing me and [facility].  Dr. Carlean Purl

## 2019-06-26 NOTE — Assessment & Plan Note (Signed)
RP banded - on eliquis (new protocol leave on anti-coag and do one at a time) RTC May 10 more banding if needed

## 2019-07-17 ENCOUNTER — Encounter: Payer: Medicare Other | Admitting: Internal Medicine

## 2019-07-25 ENCOUNTER — Encounter: Payer: Medicare Other | Admitting: Internal Medicine

## 2019-07-26 ENCOUNTER — Ambulatory Visit (HOSPITAL_COMMUNITY)
Admission: RE | Admit: 2019-07-26 | Discharge: 2019-07-26 | Disposition: A | Discharge status: Home / Self Care | Payer: Medicare Other | Source: Ambulatory Visit | Attending: Cardiology | Admitting: Cardiology

## 2019-07-26 ENCOUNTER — Other Ambulatory Visit: Payer: Self-pay

## 2019-07-26 VITALS — BP 98/48 | HR 75 | Wt 153.2 lb

## 2019-07-26 DIAGNOSIS — K219 Gastro-esophageal reflux disease without esophagitis: Secondary | ICD-10-CM | POA: Insufficient documentation

## 2019-07-26 DIAGNOSIS — I4891 Unspecified atrial fibrillation: Secondary | ICD-10-CM

## 2019-07-26 DIAGNOSIS — Z7901 Long term (current) use of anticoagulants: Secondary | ICD-10-CM | POA: Diagnosis not present

## 2019-07-26 DIAGNOSIS — Z8249 Family history of ischemic heart disease and other diseases of the circulatory system: Secondary | ICD-10-CM | POA: Diagnosis not present

## 2019-07-26 DIAGNOSIS — I4819 Other persistent atrial fibrillation: Secondary | ICD-10-CM | POA: Insufficient documentation

## 2019-07-26 DIAGNOSIS — N400 Enlarged prostate without lower urinary tract symptoms: Secondary | ICD-10-CM | POA: Diagnosis not present

## 2019-07-26 DIAGNOSIS — I428 Other cardiomyopathies: Secondary | ICD-10-CM | POA: Insufficient documentation

## 2019-07-26 DIAGNOSIS — Z79899 Other long term (current) drug therapy: Secondary | ICD-10-CM | POA: Insufficient documentation

## 2019-07-26 DIAGNOSIS — Z950 Presence of cardiac pacemaker: Secondary | ICD-10-CM | POA: Diagnosis not present

## 2019-07-26 DIAGNOSIS — I442 Atrioventricular block, complete: Secondary | ICD-10-CM | POA: Diagnosis not present

## 2019-07-26 DIAGNOSIS — E785 Hyperlipidemia, unspecified: Secondary | ICD-10-CM | POA: Insufficient documentation

## 2019-07-26 DIAGNOSIS — M353 Polymyalgia rheumatica: Secondary | ICD-10-CM | POA: Diagnosis not present

## 2019-07-26 DIAGNOSIS — I2510 Atherosclerotic heart disease of native coronary artery without angina pectoris: Secondary | ICD-10-CM | POA: Diagnosis not present

## 2019-07-26 DIAGNOSIS — I480 Paroxysmal atrial fibrillation: Secondary | ICD-10-CM | POA: Diagnosis present

## 2019-07-26 DIAGNOSIS — I5022 Chronic systolic (congestive) heart failure: Secondary | ICD-10-CM | POA: Diagnosis not present

## 2019-07-26 DIAGNOSIS — Z87891 Personal history of nicotine dependence: Secondary | ICD-10-CM | POA: Diagnosis not present

## 2019-07-26 LAB — BASIC METABOLIC PANEL
Anion gap: 11 (ref 5–15)
BUN: 25 mg/dL — ABNORMAL HIGH (ref 8–23)
CO2: 26 mmol/L (ref 22–32)
Calcium: 9.3 mg/dL (ref 8.9–10.3)
Chloride: 100 mmol/L (ref 98–111)
Creatinine, Ser: 1.05 mg/dL (ref 0.61–1.24)
GFR calc Af Amer: >60 mL/min (ref >60)
GFR calc non Af Amer: >60 mL/min (ref >60)
Glucose, Bld: 98 mg/dL (ref 70–99)
Potassium: 4.1 mmol/L (ref 3.5–5.1)
Sodium: 137 mmol/L (ref 135–145)

## 2019-07-26 LAB — BRAIN NATRIURETIC PEPTIDE: B Natriuretic Peptide: 381.6 pg/mL — ABNORMAL HIGH (ref 0.0–100.0)

## 2019-07-26 NOTE — Patient Instructions (Signed)
Labs done today. We will contact you only if your labs are abnormal.  No medication changes were made. Please continue all current medications as prescribed.  Your physician recommends that you schedule a follow-up appointment in: 3 months with an echo prior to your exam.  Your physician has requested that you have an echocardiogram. Echocardiography is a painless test that uses sound waves to create images of your heart. It provides your doctor with information about the size and shape of your heart and how well your heart's chambers and valves are working. This procedure takes approximately one hour. There are no restrictions for this procedure.  At the [facility], you and your health needs are our priority. As part of our continuing mission to provide you with exceptional heart care, we have created designated Provider Care Teams. These Care Teams include your primary Cardiologist (physician) and Advanced Practice Providers (APPs- Physician Assistants and Nurse Practitioners) who all work together to provide you with the care you need, when you need it.   You may see any of the following providers on your designated Care Team at your next follow up: Marland Kitchen Dr Glori Bickers . Dr [name] . Darrick Grinder, NP . Lyda Jester, PA . Audry Riles, PharmD   Please be sure to bring in all your medications bottles to every appointment.

## 2019-07-26 NOTE — Progress Notes (Signed)
ID:  [name], DOB February 08, 1930, MRN [id number]   Provider location: [facility] Type of Visit: Established patient   PCP:  [name], MD  Cardiologist:  No primary care provider on file. Primary HF: Dr. Aundra Dubin   History of Present Illness: [name] is a 86 y.o. male who has a history of paroxysmal atrial fibrillation, nonischemic cardiomyopathy, and complete heart block with St Jude CRT-P system.  He was admitted in 4/14 with pacemaker pocket infection.  His pacemaker was removed and temporary-permanent device was placed.  Later, he had the temporary-permanent device removed and a new CRT-P device was placed. He developed dyspnea post-operatively and was found to have a large right-sided pleural effusion.  He was admitted and got a chest tube for drainage. He had followup with Dr. Servando Snare and it was decided that he would not need VATS.  He had been on amiodarone for maintenance of NSR.  This had been more successful than Tikosyn.   Around 3/15, he started developing increasing exertional dyspnea.  He was short of breath walking up a hill or incline.  He could still walk on the treadmill for exercise for 10-15 minutes and use the elliptical without much trouble on most days.  No orthopnea or PND.  No chest pain.  He saw Dr Leanne Chang and was started on Lasix three times a week due to concern for volume overload.  This did not seem to have helped much.  After that, he had PFTs done which showed normal spirometry but DLCO 50% predicted.  Therefore, amiodarone was stopped due to concern for lung toxicity.  He saw Richardson Dopp for a pre-operative evaluation prior to right inguinal hernia repair.  Given the exertional dyspnea, he was set up for adenosine Cardiolite in 5/15.  This showed EF 36% with a small area of primarily scar in the apex, apical lateral wall, and mid anterolateral wall.  He has not felt palpitations. Echo in 5/15 showed EF 40-45%, similar to  prior study.   I had him see pulmonary for evaluation for amiodarone lung toxicity.  CT chest looked ok, and he was not thought to have amiodarone lung toxicity.   At a prior appointment in 2016, [name] was noted to be back in atrial fibrillation.  He was more short of breath with exertion and fatigued.  He has historically tolerated atrial fibrillation poorly.  I did a TEE-guided DCCV in 5/16 back to NSR.  TEE showed EF 35-40%.  He did not immediately feel better like he has in the past post-DCCV, so Lasix was increased.    Recurrent symptomatic atrial fibrillation in 8/17 with TEE-guided DCCV to NSR.  TEE showed EF 25-30%.  Recurrent symptomatic atrial fibrillation with CHF exacerbation in 9/17.  He had repeat DCCV and Lasix was increased.   He was back in atrial fibrillation in 11/17 and appears to have been out of rhythm since then.  He was more short of breath in atrial fibrillation with a significant fall in BiV pacing percentage.  Lasix was significantly increased to try to cope with volume overload.  In 2/18, he had AV nodal ablation to promote BiV pacing.    Admitted 5/19 with right eye lower visual field loss, found to have severe right carotid stenosis. S/p R CEA on 07/26/17. Switched from coumadin to Eliquis. Volume was stable.  Echo in 5/19 showed EF 30-35%, mild AI.   Echo in 6/20 showed EF 30-35% with mildly decreased  RV systolic function.  PYP scan 11/20 was not suggestive of ATTR amyloidosis.  CPX (9/20) showed moderate functional deficit due to CHF.    Patient returns for followup of CHF.  He is doing water aerobics three times a week and walks or goes to the gym on other days.  Still grieving for his wife, but getting out and dining with friends, also recently took the train to [place] to see family.  No dyspnea walking on flat ground.  He does get short of breath walking up stairs or up an incline.  No lightheadedness.  No chest pain. Weight is stable.  Taking apixaban with no  BRBPR/melena.   Labs (5/19): LDL 72, HDL 41, K 4.2, creatinine 0.86 Labs (10/19): K 3.9, creatinine 0.96 Labs (12/19): K 4.4, creatinine 0.99, LDL 63, HDl 41 Labs (3/20): K 4.4, creatinine 1.03 Labs (6/20): TSH normal, K 3.8, creatinine 0.97 Labs (9/20): Myeloma panel negative, urine immunofixation negative, K 3.7, creatinine 0.93 Labs (11/20): LDL 43 Labs (2/21): K 3.9, creatinine 1.02  PMH: 1. Low back pain 2. Hyperlipidemia 3. GERD 4. BPH 5. Polymyalgia rheumatica 6. Atrial fibrillation: Failed Tikosyn in the past.  Paroxysmal, on amiodarone and warfarin.  DCCV in 4/13, 7/13, 10/13.  Has held NSR with amiodarone.  However, concern for amiodarone lung toxicity: PFTs (4/15) with FVC 90% predicted, FEV1 90%, ratio 97%, DLCO 50%.   Recurrence of atrial fibrillation in 5/16, TEE-guided DCCV back to NSR in 5/16.  - Recurrence of atrial fibrillation in 8/17. TEE-guided DCCV 11/07/15.  - Recurrence of atrial fibrillation 9/17.  DCCV 11/20/15 but back in atrial fibrillation by 11/26/15.  - Amiodarone stopped due to persistent atrial fibrillation.  - AV nodal ablation in 2/18 to promote BiV pacing.  7. Transaminitis: Mild, attributed to amiodarone.  8. Complete heart block s/p St Jude CRT-P device.  Patient developed PCM pocket infection in 4/14 and had his first system removed with placement of a temporary permanent PCM.  He later had CRT-P device replaced.  This was complicated by large right-sided hemorrhagic pleural effusion requiring chest tube.   9. Nonischemic cardiomyopathy: Echo (9/13) with EF 40-45%, diffuse HK, mild Craig.  Mild CAD only on prior LHC.  Echo (4/14) with EF 45-50%.  Adenosine Cardiolite (5/15) with EF 36% (visually appeared higher per report), small area of scar with peri-infarct ischemia in the apex, apical lateral wall, and mid anterolateral wall. Echo (5/15) with EF 40-45%, basal to mid inferolateral severe hypokinesis, basal to mid anterolateral hypokinesis, mildly dilated RV  with mildly decreased RV systolic function, mild to moderate Craig.  TEE (5/16) with EF 35-40%, inferior and inferolateral severe hypokinesis, normal RV size with mildly decreased systolic function, moderate Craig, peak RV-RA gradient 26 mmHg.  - TEE (8/17): EF 25-30%, mildly dilated RV with mildly decreased systolic function, moderate biatrial enlargement, moderate central Craig.  - AV nodal ablation 2/18.  - Echo (5/19): EF 30-35%, mild AI.  - Echo (6/20): EF 30-35%, mild LV dilation, mild RV enlargement with mildly decreased systolic function, mild Craig.  - CPX (9/20): Peak VO2 14.4, VE/VCO2 48, RER 1.16 => moderate HF limitation.  10. Large right pleural effusion requiring chest tube following removal and reimplantation of PCM. Most recent chest CT in 6/15 showed significant decrease in size of loculated pleural effusion since 5/14.   11. Right CEA 5/19.  12. Right central retinal artery occlusion in 5/19.  13. PYP scan (11/20): Grade 1, H/CL 1.2 => no evidence for ATTR amyloidosis.  SH: Widower, prior smoker (quit 1985), no ETOH x years, retired, lives at [facility]  [place]: Father with MI at 12, mother with AAA.   Review of systems complete and found to be negative unless listed in HPI.   Current Outpatient Medications  Medication Sig Dispense Refill  . acetaminophen (TYLENOL) 500 MG tablet Take 1,000 mg by mouth every 6 (six) hours as needed for moderate pain or headache.    Marland Kitchen apixaban (ELIQUIS) 5 MG TABS tablet Take 1 tablet (5 mg total) by mouth 2 (two) times daily. 180 tablet 3  . dutasteride (AVODART) 0.5 MG capsule Take 0.5 mg by mouth daily.     . famotidine (PEPCID) 20 MG tablet Take 20 mg by mouth 2 (two) times daily.    . fluticasone (FLONASE) 50 MCG/ACT nasal spray Place 2 sprays into both nostrils daily as needed for allergies or rhinitis. 16 g 3  . furosemide (LASIX) 80 MG tablet Take 80mg  in the AM and 40mg  in the PM 135 tablet 3  . hydroxypropyl methylcellulose / hypromellose (ISOPTO  TEARS / GONIOVISC) 2.5 % ophthalmic solution Place 1 drop into both eyes 3 (three) times daily as needed for dry eyes.     Marland Kitchen losartan (COZAAR) 25 MG tablet Take 0.5 tablets (12.5 mg total) by mouth daily. 45 tablet 3  . metoprolol succinate (TOPROL-XL) 25 MG 24 hr tablet Take 25 mg by mouth daily.    . rosuvastatin (CRESTOR) 10 MG tablet TAKE 1 TABLET BY MOUTH EVERYDAY AT BEDTIME 90 tablet 3  . spironolactone (ALDACTONE) 25 MG tablet Take 1 tablet (25 mg total) by mouth daily. 90 tablet 3  . tamsulosin (FLOMAX) 0.4 MG CAPS capsule Take 0.4 mg by mouth 2 (two) times daily.     Marland Kitchen triamcinolone cream (KENALOG) 0.1 % Apply 1 application topically 2 (two) times daily. For 7-10 days maximum 28.4 g 0   No current facility-administered medications for this encounter.    BP (!) 98/48   Pulse 75   Wt 69.5 kg (153 lb 3.2 oz)   SpO2 96%   BMI 24.73 kg/m   Wt Readings from Last 3 Encounters:  07/26/19 69.5 kg (153 lb 3.2 oz)  06/26/19 70.4 kg (155 lb 3.2 oz)  06/21/19 68 kg (150 lb)   Exam:   BP (!) 98/48   Pulse 75   Wt 69.5 kg (153 lb 3.2 oz)   SpO2 96%   BMI 24.73 kg/m  General: NAD Neck: No JVD, no thyromegaly or thyroid nodule.  Lungs: Clear to auscultation bilaterally with normal respiratory effort. CV: Nondisplaced PMI.  Heart regular S1/S2, no S3/S4, no murmur.  No peripheral edema.  No carotid bruit.  Normal pedal pulses.  Abdomen: Soft, nontender, no hepatosplenomegaly, no distention.  Skin: Intact without lesions or rashes.  Neurologic: Alert and oriented x 3.  Psych: Normal affect. Extremities: No clubbing or cyanosis.  HEENT: Normal.   Assessment/Plan: 1. Chronic systolic CHF:  Presumed nonischemic cardiomyopathy (prior cath with only mild CAD).  He had an adenosine Cardiolite in 5/15 with EF 36% and possible small area of apical and lateral scar with peri-infarct ischemia. He has St Jude CRT-P.  Echo in 6/20 showed EF 30-35%. CPX (9/20) with moderate HF limitation. NYHA  class II, weight stable. Med titration limited by orthostatic symptoms. He is not volume overloaded on exam.  - Continue Lasix 80 qam/40 qpm. BMET/BNP today.    - Continue Toprol XL 25 mg daily.  - Continue spironolactone 25  mg daily.  - Continue losartan 12.5 mg daily.  No BP room to increase.  - I will arrange for echocardiogram at followup appt.  2. Atrial fibrillation: Now persistent.  Had breakthrough with Tikosyn and now not able to hold NSR on amiodarone. He is now off amiodarone.  He is status post AV nodal ablation with significant improvement in symptoms and BiV pacing percentage.  - Continue apixiban 5 mg BID. 3. Hyperlipidemia: History of nonobstructive CAD and recent CEA.    - Continue Crestor, good lipids in 11/20.  4. Rt CEA occlusion s/p R CEA 07/26/17: He had an ulcerated, 70% stenotic plaque in the right internal carotid. Suspect this is the source of embolism for CRAO.     - Continue statin.  - Followed at [facility].   Followup in 3 months with echo.  Signed, [name], MD  07/26/2019  [facility] [address] [facility] [place] [address] [phone] (office) [phone] (fax)

## 2019-08-08 ENCOUNTER — Ambulatory Visit (INDEPENDENT_AMBULATORY_CARE_PROVIDER_SITE_OTHER): Payer: Medicare Other | Admitting: *Deleted

## 2019-08-08 DIAGNOSIS — I495 Sick sinus syndrome: Secondary | ICD-10-CM | POA: Diagnosis not present

## 2019-08-08 DIAGNOSIS — I4811 Longstanding persistent atrial fibrillation: Secondary | ICD-10-CM

## 2019-08-09 ENCOUNTER — Encounter: Payer: Self-pay | Admitting: Internal Medicine

## 2019-08-09 ENCOUNTER — Ambulatory Visit (INDEPENDENT_AMBULATORY_CARE_PROVIDER_SITE_OTHER): Payer: Medicare Other | Admitting: Internal Medicine

## 2019-08-09 VITALS — BP 110/72 | HR 78 | Ht 66.0 in | Wt 154.0 lb

## 2019-08-09 DIAGNOSIS — K641 Second degree hemorrhoids: Secondary | ICD-10-CM

## 2019-08-09 LAB — CUP PACEART REMOTE DEVICE CHECK
Battery Remaining Longevity: 34 mo
Battery Remaining Percentage: 35 %
Battery Voltage: 2.83 V
Date Time Interrogation Session: 20210601093900
Implantable Lead Implant Date: 20140422
Implantable Lead Implant Date: 20140424
Implantable Lead Implant Date: 20140424
Implantable Lead Location: 753858
Implantable Lead Location: 753859
Implantable Lead Location: 753860
Implantable Lead Model: 5076
Implantable Lead Model: 5076
Implantable Pulse Generator Implant Date: 20140422
Lead Channel Impedance Value: 410 Ohm
Lead Channel Impedance Value: 930 Ohm
Lead Channel Pacing Threshold Amplitude: 1.25 V
Lead Channel Pacing Threshold Amplitude: 1.25 V
Lead Channel Pacing Threshold Pulse Width: 0.5 ms
Lead Channel Pacing Threshold Pulse Width: 0.5 ms
Lead Channel Sensing Intrinsic Amplitude: 12 mV
Lead Channel Setting Pacing Amplitude: 2.5 V
Lead Channel Setting Pacing Amplitude: 2.5 V
Lead Channel Setting Pacing Pulse Width: 0.5 ms
Lead Channel Setting Pacing Pulse Width: 0.5 ms
Lead Channel Setting Sensing Sensitivity: 4 mV
Pulse Gen Model: 3210
Pulse Gen Serial Number: 2926516

## 2019-08-09 NOTE — Patient Instructions (Addendum)
HEMORRHOID BANDING PROCEDURE    FOLLOW-UP CARE   1. The procedure you have had should have been relatively painless since the banding of the area involved does not have nerve endings and there is no pain sensation.  The rubber band cuts off the blood supply to the hemorrhoid and the band may fall off as soon as 48 hours after the banding (the band may occasionally be seen in the toilet bowl following a bowel movement). You may notice a temporary feeling of fullness in the rectum which should respond adequately to plain Tylenol or Motrin.  2. Following the banding, avoid strenuous exercise that evening and resume full activity the next day.  A sitz bath (soaking in a warm tub) or bidet is soothing, and can be useful for cleansing the area after bowel movements.     3. To avoid constipation, take two tablespoons of natural wheat bran, natural oat bran, flax, Benefiber or any over the counter fiber supplement and increase your water intake to 7-8 glasses daily.    4. Unless you have been prescribed anorectal medication, do not put anything inside your rectum for two weeks: No suppositories, enemas, fingers, etc.  5. Occasionally, you may have more bleeding than usual after the banding procedure.  This is often from the untreated hemorrhoids rather than the treated one.  Don't be concerned if there is a tablespoon or so of blood.  If there is more blood than this, lie flat with your bottom higher than your head and apply an ice pack to the area. If the bleeding does not stop within a half an hour or if you feel faint, call our office at [phone] or go to the emergency room.  6. Problems are not common; however, if there is a substantial amount of bleeding, severe pain, chills, fever or difficulty passing urine (very rare) or other problems, you should call us at [phone] or report to the nearest emergency room.  7. Do not stay seated continuously for more than 2-3 hours for a day or two  after the procedure.  Tighten your buttock muscles 10-15 times every two hours and take 10-15 deep breaths every 1-2 hours.  Do not spend more than a few minutes on the toilet if you cannot empty your bowel; instead re-visit the toilet at a later time.    We will see you back on 09/01/2019 for additional banding.   [place]!   I appreciate the opportunity to care for you. Silvano Rusk, MD, [facility]

## 2019-08-09 NOTE — Progress Notes (Signed)
Remote pacemaker transmission.   

## 2019-08-09 NOTE — Progress Notes (Signed)
PROCEDURE NOTE: The patient presents with symptomatic grade 2  Hemorrhoids (fecal soiling) requesting rubber band ligation of his/her hemorrhoidal disease.  All risks, benefits and alternative forms of therapy were described and informed consent was obtained.   The anorectum was pre-medicated with 0.125% NTG and 5% lidocaine The decision was made to band the LL internal hemorrhoid, and the Marion was used to perform band ligation without complication.  Digital anorectal examination was then performed to assure proper positioning of the band, and to adjust the banded tissue as required.  The patient was discharged home without pain or other issues.  Dietary and behavioral recommendations were given and along with follow-up instructions.       The patient will return in a few weeks for  follow-up and possible additional banding as required. No complications were encountered and the patient tolerated the procedure well.

## 2019-08-09 NOTE — Assessment & Plan Note (Signed)
Banded LL - on Eliquis RTC 2-4 weeks band RA Hopefully this will help fecal smearing but sphincter tone does seem a bit lax as well

## 2019-08-18 ENCOUNTER — Ambulatory Visit: Payer: Medicare Other | Admitting: Neurology

## 2019-08-22 ENCOUNTER — Other Ambulatory Visit: Payer: Self-pay | Admitting: *Deleted

## 2019-08-22 DIAGNOSIS — I6529 Occlusion and stenosis of unspecified carotid artery: Secondary | ICD-10-CM

## 2019-08-28 ENCOUNTER — Other Ambulatory Visit: Payer: Self-pay

## 2019-08-28 ENCOUNTER — Ambulatory Visit (HOSPITAL_COMMUNITY)
Admission: RE | Admit: 2019-08-28 | Discharge: 2019-08-28 | Disposition: A | Discharge status: Home / Self Care | Payer: Medicare Other | Source: Ambulatory Visit | Attending: Surgery | Admitting: Surgery

## 2019-08-28 ENCOUNTER — Ambulatory Visit (INDEPENDENT_AMBULATORY_CARE_PROVIDER_SITE_OTHER): Payer: Medicare Other | Admitting: Physician Assistant

## 2019-08-28 VITALS — BP 88/62 | HR 75 | Temp 98.1°F | Resp 20 | Ht 66.0 in | Wt 150.9 lb

## 2019-08-28 DIAGNOSIS — I6523 Occlusion and stenosis of bilateral carotid arteries: Secondary | ICD-10-CM

## 2019-08-28 DIAGNOSIS — I6529 Occlusion and stenosis of unspecified carotid artery: Secondary | ICD-10-CM | POA: Insufficient documentation

## 2019-08-28 DIAGNOSIS — Z9889 Other specified postprocedural states: Secondary | ICD-10-CM

## 2019-08-28 DIAGNOSIS — Z8249 Family history of ischemic heart disease and other diseases of the circulatory system: Secondary | ICD-10-CM | POA: Diagnosis not present

## 2019-08-28 NOTE — Progress Notes (Signed)
Office Note     CC:  follow up Requesting Provider:  [name], MD  HPI: [name] is a 86 y.o. (1929-11-24) male who presents for follow up of right carotid stenosis. He is s/p right carotid endarterectomy on 07/26/17 by Dr. Donnetta Hutching for retinal artery embolus and high grade right ICA stenosis. He has lower visual field loss on the right..  He denies any new neurological deficits. He does have some new floaters in his left eye that is worse at night. He states he has appointment with his Opthalmologist to have this evaluated. He otherwise denies any TIA/ Stroke like symptoms. He denies any amaurosis, slurred speech, facial weakness or numbness, unilateral weakness or numbness in his upper or lower extremities   He denies any abdominal or back pain. He does not have any lower extremity claudication, rest pain or non healing wounds. He does describe weakness and fatigue in his legs on prolonged ambulation but this is improved with rest  Family history of AAA in mother who died of rupture at age of 54  The pt is on a statin for cholesterol management.  The pt is not on a daily aspirin.   Other AC:  Eliquis The pt is on ARB, BB for hypertension.   The pt is not diabetic.   Tobacco hx: Former, 1985  Past Medical History:  Diagnosis Date  . Arthritis    "back" (03/20/2014)  . Atrial fibrillation ([place])   . BPH (benign prostatic hypertrophy)   . Breast mass    "on both sides" (06/28/2012)  . Cardiomyopathy primary-nonischemic EF 45%  . Carotid artery occlusion   . CHF (congestive heart failure) ([place])   . Coronary artery disease   . Diverticulosis of colon without hemorrhage 01/03/2014  . Dysphagia   . Elevated liver enzymes   . Esophageal stricture   . GERD (gastroesophageal reflux disease)   . Hearing loss, mixed, bilateral   . Hiatal hernia   . Hx of cardiovascular stress test    Adenosine Myoview (07/2013):  Apical cap, apical lateral and mid anterolateral scar, small  amount of peri-infarct ischemia, EF 36%; Medium Risk  . Hyperlipidemia   . Increased prostate specific antigen (PSA) velocity   . Internal hemorrhoids 01/03/2014  . Lumbar back pain   . Pacemaker    st jude  . Pacemaker infection ([place])    06/28/12  . Polymyalgia rheumatica ([place])   . Sick sinus syndrome [facility])     Past Surgical History:  Procedure Laterality Date  . AV NODE ABLATION N/A 04/20/2016   Procedure: AV Node Ablation;  Surgeon: Evans Lance, MD;  Location: [place] CV LAB;  Service: Cardiovascular;  Laterality: N/A;  . BASAL CELL CARCINOMA EXCISION  2021   ear  . CARDIAC CATHETERIZATION  1980's   [place]  . CARDIOVERSION  06/17/2011   Procedure: CARDIOVERSION;  Surgeon: Lelon Perla, MD;  Location: [place];  Service: Cardiovascular;  Laterality: N/A;  . CARDIOVERSION  09/09/2011   Procedure: CARDIOVERSION;  Surgeon: Carlena Bjornstad, MD;  Location: [place];  Service: Cardiovascular;  Laterality: N/A;  . CARDIOVERSION  01/01/2012   Procedure: CARDIOVERSION;  Surgeon: Hillary Bow, MD;  Location: [facility] ENDOSCOPY;  Service: Cardiovascular;  Laterality: N/A;  . CARDIOVERSION N/A 08/03/2014   Procedure: CARDIOVERSION;  Surgeon: Larey Dresser, MD;  Location: [facility] ENDOSCOPY;  Service: Cardiovascular;  Laterality: N/A;  . CARDIOVERSION N/A 11/07/2015   Procedure: CARDIOVERSION;  Surgeon: Larey Dresser, MD;  Location: [place];  Service: Cardiovascular;  Laterality: N/A;  . CARDIOVERSION N/A 11/20/2015   Procedure: CARDIOVERSION;  Surgeon: Larey Dresser, MD;  Location: [place];  Service: Cardiovascular;  Laterality: N/A;  . CAROTID ENDARTERECTOMY    . CATARACT EXTRACTION, BILATERAL Bilateral 1980's  . COLONOSCOPY N/A 01/03/2014   Procedure: COLONOSCOPY;  Surgeon: Gatha Mayer, MD;  Location: [facility] ENDOSCOPY;  Service: Endoscopy;  Laterality: N/A;  . ENDARTERECTOMY Right 07/26/2017   Procedure: RIGHT CAROTID  ARTERY ENDARTERECTOMY;  Surgeon: Rosetta Posner, MD;  Location: [facility]  OR;  Service: Vascular;  Laterality: Right;  . ESOPHAGOGASTRODUODENOSCOPY    . GENERATOR REMOVAL Left 06/15/2012   Procedure: GENERATOR REMOVAL;  Surgeon: Evans Lance, MD;  Location: [place];  Service: Cardiovascular;  Laterality: Left;  . HEMORRHOID BANDING    . INGUINAL HERNIA REPAIR Right 2012; 03/20/2014  . INGUINAL HERNIA REPAIR Right 03/20/2014   Procedure: OPEN REPAIR OF RIGHT INGUINAL WITH MESH;  Surgeon: Donnie Mesa, MD;  Location: [place];  Service: General;  Laterality: Right;  . INSERT / REPLACE / REMOVE PACEMAKER    . INSERTION OF MESH Right 03/20/2014   Procedure: INSERTION OF MESH;  Surgeon: Donnie Mesa, MD;  Location: [place];  Service: General;  Laterality: Right;  . KNEE ARTHROSCOPY WITH LATERAL MENISECTOMY Left 12/16/2016   Procedure: KNEE ARTHROSCOPY WITH LATERAL MENISECTOMY and Chondroplasty;  Surgeon: Earlie Server, MD;  Location: [place];  Service: Orthopedics;  Laterality: Left;  . LEAD REVISION N/A 06/29/2012   Procedure: LEAD REVISION;  Surgeon: Deboraha Sprang, MD;  Location: [facility] CATH LAB;  Service: Cardiovascular;  Laterality: N/A;  . MELANOMA EXCISION Right 2020   arm  . PACEMAKER GENERATOR CHANGE N/A 06/15/2012   Procedure: PACEMAKER GENERATOR CHANGE;  Surgeon: Evans Lance, MD;  Location: [place];  Service: Cardiovascular;  Laterality: N/A;  . PACEMAKER REVISION  06/30/2012   Procedure: PACEMAKER LEAD REVISION;  Surgeon: Evans Lance, MD;  Location: [facility] CATH LAB;  Service: Cardiovascular;;  . PARACENTESIS  ~ 03/5174   complication from pacer change in 2014  . PATCH ANGIOPLASTY Right 07/26/2017   Procedure: WITH PATCH ANGIOPLASTY;  Surgeon: Rosetta Posner, MD;  Location: [place];  Service: Vascular;  Laterality: Right;  . PERMANENT PACEMAKER INSERTION N/A 06/28/2012   Procedure: PERMANENT PACEMAKER INSERTION;  Surgeon: Evans Lance, MD;  Location: [facility] CATH LAB;  Service: Cardiovascular;  Laterality: N/A;  . TEE WITHOUT CARDIOVERSION  01/01/2012   Procedure: TRANSESOPHAGEAL  ECHOCARDIOGRAM (TEE);  Surgeon: Fay Records, MD;  Location: [facility] ENDOSCOPY;  Service: Cardiovascular;  Laterality: N/A;  . TEE WITHOUT CARDIOVERSION N/A 08/03/2014   Procedure: TRANSESOPHAGEAL ECHOCARDIOGRAM (TEE);  Surgeon: Larey Dresser, MD;  Location: [place];  Service: Cardiovascular;  Laterality: N/A;  . TEE WITHOUT CARDIOVERSION N/A 11/07/2015   Procedure: TRANSESOPHAGEAL ECHOCARDIOGRAM (TEE);  Surgeon: Larey Dresser, MD;  Location: [place];  Service: Cardiovascular;  Laterality: N/A;  . TONSILLECTOMY AND ADENOIDECTOMY  1938  . UMBILICAL HERNIA REPAIR  [phone] X 3    Social History   Socioeconomic History  . Marital status: Widowed    Spouse name: [name]  . Number of children: 3  . Years of education: 16  . Highest education level: Bachelor's degree (e.g., BA, AB, BS)  Occupational History  . Occupation: retired    Fish farm manager: RETIRED    Comment: Management [employer]  Tobacco Use  . Smoking status: Former Smoker    Packs/day: 1.00    Years: 40.00  Pack years: 40.00    Types: Cigarettes    Quit date: 03/10/1983    Years since quitting: 36.4  . Smokeless tobacco: Never Used  Vaping Use  . Vaping Use: Never used  Substance and Sexual Activity  . Alcohol use: No    Alcohol/week: 0.0 standard drinks    Comment: quit drinking 2000-? alcohol related cardiomyopathy  . Drug use: No  . Sexual activity: Not Currently  Other Topics Concern  . Not on file  Social History Narrative   Married. 3 children (6 together). 5 grandkids with with 5 grandkids (2nd marriage). No greatgrandkids.    One story home. Lives in [place]    Retired from Financial planner   Education: college   Hobbies: play golf, bridge, garden, write family stories   Social Determinants of Health   Financial Resource Strain: [place]   . Difficulty of Paying Living Expenses: Not hard at all  Food Insecurity: No Food Insecurity  . Worried About  Charity fundraiser in the Last Year: Never true  . Ran Out of Food in the Last Year: Never true  Transportation Needs: No Transportation Needs  . Lack of Transportation (Medical): No  . Lack of Transportation (Non-Medical): No  Physical Activity: Insufficiently Active  . Days of Exercise per Week: 4 days  . Minutes of Exercise per Session: 20 min  Stress: No Stress Concern Present  . Feeling of Stress : Only a little  Social Connections: Moderately Integrated  . Frequency of Communication with Friends and Family: More than three times a week  . Frequency of Social Gatherings with Friends and Family: More than three times a week  . Attends Religious Services: More than 4 times per year  . Active Member of Clubs or Organizations: No  . Attends Archivist Meetings: Never  . Marital Status: Married  Human resources officer Violence:   . Fear of Current or Ex-Partner:   . Emotionally Abused:   Marland Kitchen Physically Abused:   . Sexually Abused:     Family History  Problem Relation Age of Onset  . Heart attack Father 27       deceased  . AAA (abdominal aortic aneurysm) Mother        deceased AAA  . Hypertension Mother   . Other Brother        deceased at birth  . Healthy Son   . Healthy Daughter   . Diabetes Cousin   . Esophageal cancer Neg Hx   . Colon cancer Neg Hx     Current Outpatient Medications  Medication Sig Dispense Refill  . acetaminophen (TYLENOL) 500 MG tablet Take 1,000 mg by mouth every 6 (six) hours as needed for moderate pain or headache.    Marland Kitchen apixaban (ELIQUIS) 5 MG TABS tablet Take 1 tablet (5 mg total) by mouth 2 (two) times daily. 180 tablet 3  . dutasteride (AVODART) 0.5 MG capsule Take 0.5 mg by mouth daily.     . famotidine (PEPCID) 20 MG tablet Take 20 mg by mouth 2 (two) times daily.    . fluticasone (FLONASE) 50 MCG/ACT nasal spray Place 2 sprays into both nostrils daily as needed for allergies or rhinitis. 16 g 3  . furosemide (LASIX) 80 MG tablet Take  80mg  in the AM and 40mg  in the PM 135 tablet 3  . hydroxypropyl methylcellulose / hypromellose (ISOPTO TEARS / GONIOVISC) 2.5 % ophthalmic solution Place 1 drop into both eyes 3 (three) times daily  as needed for dry eyes.     Marland Kitchen losartan (COZAAR) 25 MG tablet Take 0.5 tablets (12.5 mg total) by mouth daily. 45 tablet 3  . metoprolol succinate (TOPROL-XL) 25 MG 24 hr tablet Take 25 mg by mouth daily.    . rosuvastatin (CRESTOR) 10 MG tablet TAKE 1 TABLET BY MOUTH EVERYDAY AT BEDTIME 90 tablet 3  . spironolactone (ALDACTONE) 25 MG tablet Take 1 tablet (25 mg total) by mouth daily. 90 tablet 3  . tamsulosin (FLOMAX) 0.4 MG CAPS capsule Take 0.4 mg by mouth 2 (two) times daily.     Marland Kitchen triamcinolone cream (KENALOG) 0.1 % Apply 1 application topically 2 (two) times daily. For 7-10 days maximum 28.4 g 0   No current facility-administered medications for this visit.    Allergies  Allergen Reactions  . Antihistamines, Diphenhydramine-Type Other (See Comments)    Causes difficulty in ability to urinate.     REVIEW OF SYSTEMS:  Review of Systems  Constitutional: Negative for chills, fever and malaise/fatigue.  HENT: Positive for congestion. Negative for sore throat.   Eyes: Positive for blurred vision (left eye floaters especially at night).  Respiratory: Positive for cough and sputum production (recent cold- clear sputum). Negative for shortness of breath and wheezing.   Cardiovascular: Negative for chest pain, palpitations, claudication and leg swelling.  Gastrointestinal: Negative for abdominal pain, blood in stool, constipation, diarrhea, nausea and vomiting.  Genitourinary: Negative for dysuria.  Musculoskeletal: Negative for myalgias.  Neurological: Negative for dizziness, tingling, speech change, focal weakness and headaches.  Endo/Heme/Allergies: Bruises/bleeds easily.    PHYSICAL EXAMINATION:  Vitals:   08/28/19 1025 08/28/19 1027  BP: 93/60 (!) 88/62  Pulse: 75   Resp: 20   Temp:  98.1 F (36.7 C)   TempSrc: Temporal   SpO2: 96%   Weight: 150 lb 14.4 oz (68.4 kg)   Height: 5\' 6"  (1.676 m)     General:  WDWN in NAD; vital signs documented above Gait: Normal HENT: WNL, normocephalic Pulmonary: normal non-labored breathing without wheezing Cardiac: regular HR, without  Murmurs without carotid bruit Abdomen: soft, NT, no masses Vascular Exam/Pulses:  Right Left  Radial 2+ (normal) 2+ (normal)  Femoral 2+ (normal) 2+ (normal)  Popliteal 2+ (normal) 2+ (normal)  DP 1+ (weak) 1+ (weak)  PT 1+ (weak) 1+ (weak)   Extremities: without ischemic changes, without Gangrene , without cellulitis; without open wounds; reticular veins on bilateral lower extremities, no edema or swelling Musculoskeletal: no muscle wasting or atrophy  Neurologic: A&O X 3;  No focal weakness or paresthesias are detected Psychiatric:  The pt has Normal affect.   Non-Invasive Vascular Imaging:   08/28/19 Right Carotid: There is no evidence of stenosis in the right ICA.   Left Carotid: Velocities in the left ICA are consistent with a 1-39% stenosis.   Vertebrals: Bilateral vertebral arteries demonstrate antegrade flow.  Subclavians: Normal flow hemodynamics were seen in bilateral subclavian arteries.    ASSESSMENT/PLAN:: 86 y.o. male here for follow up for carotid stenosis. He is status post right carotid endarterectomy. He is asymptomatic at this time. His duplex evaluation shows patent CEA site on the right with no recurrent stenosis and Left ICA stenosis of 1-39%.  - He will continue his Rosuvastatin and Eliquis -patient has family history of mother with AAA who died from rupture. He has never been evaluated for this. I have recommended screening evaluation due to his family history. He will come in for this study and I have told him  I will call him with the results - Advised him to follow up earlier if he has any new neurological symptoms - He will follow up in 1 year with Carotid Duplex  evaluation    Karoline Caldwell, PA-C [facility] [phone]

## 2019-09-01 ENCOUNTER — Encounter: Payer: Self-pay | Admitting: Internal Medicine

## 2019-09-01 ENCOUNTER — Ambulatory Visit (INDEPENDENT_AMBULATORY_CARE_PROVIDER_SITE_OTHER): Payer: Medicare Other | Admitting: Internal Medicine

## 2019-09-01 VITALS — BP 100/50 | HR 80 | Ht 65.0 in | Wt 156.1 lb

## 2019-09-01 DIAGNOSIS — K641 Second degree hemorrhoids: Secondary | ICD-10-CM

## 2019-09-01 NOTE — Progress Notes (Signed)
   HEMORRHOID LIGATION:  sxs are fecal smearing - s/p banding in 2012, 2018 and 2020 and now here for third banding on retry (LL and RP earlier this year)  PROCEDURE NOTE: The patient presents with symptomatic grade 2  hemorrhoids, requesting rubber band ligation of his/her hemorrhoidal disease.  All risks, benefits and alternative forms of therapy were described and informed consent was obtained.   The anorectum was pre-medicated with 0.125% NTG and 5% lidocaine The decision was made to band the RA internal hemorrhoid, and the [place] was used to perform band ligation without complication.  Digital anorectal examination was then performed to assure proper positioning of the band, and to adjust the banded tissue as required.  The patient was discharged home without pain or other issues.  Dietary and behavioral recommendations were given and along with follow-up instructions.     The following adjunctive treatments were recommended:  benefiber 2 tsp nightly  The patient will return prn for  follow-up and possible additional banding as required. No complications were encountered and the patient tolerated the procedure well.

## 2019-09-01 NOTE — Assessment & Plan Note (Addendum)
Ra banded benefiber 2 tsp nightly I think I have maxed out what I can do w/ hemorrhoids RTC prn

## 2019-09-01 NOTE — Patient Instructions (Addendum)
HEMORRHOID BANDING PROCEDURE    FOLLOW-UP CARE   1. The procedure you have had should have been relatively painless since the banding of the area involved does not have nerve endings and there is no pain sensation.  The rubber band cuts off the blood supply to the hemorrhoid and the band may fall off as soon as 48 hours after the banding (the band may occasionally be seen in the toilet bowl following a bowel movement). You may notice a temporary feeling of fullness in the rectum which should respond adequately to plain Tylenol or Motrin.  2. Following the banding, avoid strenuous exercise that evening and resume full activity the next day.  A sitz bath (soaking in a warm tub) or bidet is soothing, and can be useful for cleansing the area after bowel movements.     3. To avoid constipation, take two tablespoons of natural wheat bran, natural oat bran, flax, Benefiber or any over the counter fiber supplement and increase your water intake to 7-8 glasses daily.    4. Unless you have been prescribed anorectal medication, do not put anything inside your rectum for two weeks: No suppositories, enemas, fingers, etc.  5. Occasionally, you may have more bleeding than usual after the banding procedure.  This is often from the untreated hemorrhoids rather than the treated one.  Don't be concerned if there is a tablespoon or so of blood.  If there is more blood than this, lie flat with your bottom higher than your head and apply an ice pack to the area. If the bleeding does not stop within a half an hour or if you feel faint, call our office at [phone] or go to the emergency room.  6. Problems are not common; however, if there is a substantial amount of bleeding, severe pain, chills, fever or difficulty passing urine (very rare) or other problems, you should call us at [phone] or report to the nearest emergency room.  7. Do not stay seated continuously for more than 2-3 hours for a day or two  after the procedure.  Tighten your buttock muscles 10-15 times every two hours and take 10-15 deep breaths every 1-2 hours.  Do not spend more than a few minutes on the toilet if you cannot empty your bowel; instead re-visit the toilet at a later time.    Take Benefiber as needed.    Follow up with Dr Carlean Purl as needed.   I appreciate the opportunity to care for you. Silvano Rusk, MD, [facility]

## 2019-09-21 ENCOUNTER — Other Ambulatory Visit: Payer: Self-pay | Admitting: *Deleted

## 2019-09-21 DIAGNOSIS — Z8249 Family history of ischemic heart disease and other diseases of the circulatory system: Secondary | ICD-10-CM

## 2019-09-28 ENCOUNTER — Ambulatory Visit (HOSPITAL_COMMUNITY)
Admission: RE | Admit: 2019-09-28 | Discharge: 2019-09-28 | Disposition: A | Discharge status: Home / Self Care | Payer: Medicare Other | Source: Ambulatory Visit | Attending: Vascular Surgery | Admitting: Vascular Surgery

## 2019-09-28 ENCOUNTER — Other Ambulatory Visit: Payer: Self-pay

## 2019-09-28 DIAGNOSIS — E785 Hyperlipidemia, unspecified: Secondary | ICD-10-CM | POA: Insufficient documentation

## 2019-09-28 DIAGNOSIS — Z8249 Family history of ischemic heart disease and other diseases of the circulatory system: Secondary | ICD-10-CM | POA: Diagnosis not present

## 2019-10-02 DIAGNOSIS — R3121 Asymptomatic microscopic hematuria: Secondary | ICD-10-CM | POA: Diagnosis not present

## 2019-10-02 DIAGNOSIS — N401 Enlarged prostate with lower urinary tract symptoms: Secondary | ICD-10-CM | POA: Diagnosis not present

## 2019-10-02 DIAGNOSIS — R3914 Feeling of incomplete bladder emptying: Secondary | ICD-10-CM | POA: Diagnosis not present

## 2019-10-02 DIAGNOSIS — R351 Nocturia: Secondary | ICD-10-CM | POA: Diagnosis not present

## 2019-10-17 ENCOUNTER — Telehealth: Payer: Self-pay | Admitting: Family Medicine

## 2019-10-17 NOTE — Progress Notes (Signed)
  Chronic Care Management   Outreach Note  10/17/2019 Name: ALICK LECOMTE MRN: [id number] DOB: 06-Sep-1929  Referred by: [name], MD Reason for referral : No chief complaint on file.   An unsuccessful telephone outreach was attempted today. The patient was referred to the pharmacist for assistance with care management and care coordination.   Follow Up Plan:   Earney [place] Upstream Scheduler

## 2019-10-18 ENCOUNTER — Telehealth: Payer: Self-pay | Admitting: Family Medicine

## 2019-10-18 NOTE — Progress Notes (Signed)
  Chronic Care Management   Note  10/18/2019 Name: [name] MRN: [id number] DOB: 1929/03/18  [name] is a 86 y.o. year old male who is a primary care patient of [name], MD. I reached out to [name] by phone today in response to a referral sent by [name]'s PCP, [name], MD.   [name] was given information about Chronic Care Management services today including:  1. CCM service includes personalized support from designated clinical staff supervised by his physician, including individualized plan of care and coordination with other care providers 2. 24/7 contact phone numbers for assistance for urgent and routine care needs. 3. Service will only be billed when office clinical staff spend 20 minutes or more in a month to coordinate care. 4. Only one practitioner may furnish and bill the service in a calendar month. 5. The patient may stop CCM services at any time (effective at the end of the month) by phone call to the office staff.   Patient agreed to services and verbal consent obtained.   Follow up plan:   Earney Hamburg Upstream Scheduler

## 2019-10-25 DIAGNOSIS — N281 Cyst of kidney, acquired: Secondary | ICD-10-CM | POA: Diagnosis not present

## 2019-10-25 DIAGNOSIS — R3121 Asymptomatic microscopic hematuria: Secondary | ICD-10-CM | POA: Diagnosis not present

## 2019-10-25 DIAGNOSIS — N200 Calculus of kidney: Secondary | ICD-10-CM | POA: Diagnosis not present

## 2019-10-25 DIAGNOSIS — K5730 Diverticulosis of large intestine without perforation or abscess without bleeding: Secondary | ICD-10-CM | POA: Diagnosis not present

## 2019-10-25 DIAGNOSIS — N400 Enlarged prostate without lower urinary tract symptoms: Secondary | ICD-10-CM | POA: Diagnosis not present

## 2019-11-06 DIAGNOSIS — D485 Neoplasm of uncertain behavior of skin: Secondary | ICD-10-CM | POA: Diagnosis not present

## 2019-11-06 DIAGNOSIS — Z85828 Personal history of other malignant neoplasm of skin: Secondary | ICD-10-CM | POA: Diagnosis not present

## 2019-11-06 DIAGNOSIS — L578 Other skin changes due to chronic exposure to nonionizing radiation: Secondary | ICD-10-CM | POA: Diagnosis not present

## 2019-11-06 DIAGNOSIS — L821 Other seborrheic keratosis: Secondary | ICD-10-CM | POA: Diagnosis not present

## 2019-11-06 DIAGNOSIS — Z86006 Personal history of melanoma in-situ: Secondary | ICD-10-CM | POA: Diagnosis not present

## 2019-11-06 DIAGNOSIS — D0321 Melanoma in situ of right ear and external auricular canal: Secondary | ICD-10-CM | POA: Diagnosis not present

## 2019-11-06 DIAGNOSIS — D225 Melanocytic nevi of trunk: Secondary | ICD-10-CM | POA: Diagnosis not present

## 2019-11-06 DIAGNOSIS — L570 Actinic keratosis: Secondary | ICD-10-CM | POA: Diagnosis not present

## 2019-11-07 ENCOUNTER — Ambulatory Visit (INDEPENDENT_AMBULATORY_CARE_PROVIDER_SITE_OTHER): Payer: Medicare Other | Admitting: *Deleted

## 2019-11-07 DIAGNOSIS — I495 Sick sinus syndrome: Secondary | ICD-10-CM | POA: Diagnosis not present

## 2019-11-07 LAB — CUP PACEART REMOTE DEVICE CHECK
Battery Remaining Longevity: 28 mo
Battery Remaining Percentage: 29 %
Battery Voltage: 2.81 V
Date Time Interrogation Session: 20210831074408
Implantable Lead Implant Date: 20140422
Implantable Lead Implant Date: 20140424
Implantable Lead Implant Date: 20140424
Implantable Lead Location: 753858
Implantable Lead Location: 753859
Implantable Lead Location: 753860
Implantable Lead Model: 5076
Implantable Lead Model: 5076
Implantable Pulse Generator Implant Date: 20140422
Lead Channel Impedance Value: 400 Ohm
Lead Channel Impedance Value: 930 Ohm
Lead Channel Pacing Threshold Amplitude: 1.25 V
Lead Channel Pacing Threshold Amplitude: 1.25 V
Lead Channel Pacing Threshold Pulse Width: 0.5 ms
Lead Channel Pacing Threshold Pulse Width: 0.5 ms
Lead Channel Sensing Intrinsic Amplitude: 11 mV
Lead Channel Setting Pacing Amplitude: 2.5 V
Lead Channel Setting Pacing Amplitude: 2.5 V
Lead Channel Setting Pacing Pulse Width: 0.5 ms
Lead Channel Setting Pacing Pulse Width: 0.5 ms
Lead Channel Setting Sensing Sensitivity: 4 mV
Pulse Gen Model: 3210
Pulse Gen Serial Number: 2926516

## 2019-11-08 ENCOUNTER — Other Ambulatory Visit (HOSPITAL_COMMUNITY): Payer: Medicare Other

## 2019-11-08 ENCOUNTER — Ambulatory Visit (INDEPENDENT_AMBULATORY_CARE_PROVIDER_SITE_OTHER): Payer: Medicare Other | Admitting: Podiatry

## 2019-11-08 ENCOUNTER — Encounter (HOSPITAL_COMMUNITY): Payer: Medicare Other | Admitting: Cardiology

## 2019-11-08 ENCOUNTER — Other Ambulatory Visit: Payer: Self-pay

## 2019-11-08 ENCOUNTER — Encounter: Payer: Self-pay | Admitting: Podiatry

## 2019-11-08 DIAGNOSIS — I6523 Occlusion and stenosis of bilateral carotid arteries: Secondary | ICD-10-CM

## 2019-11-08 DIAGNOSIS — L600 Ingrowing nail: Secondary | ICD-10-CM

## 2019-11-08 DIAGNOSIS — D689 Coagulation defect, unspecified: Secondary | ICD-10-CM | POA: Diagnosis not present

## 2019-11-08 NOTE — Progress Notes (Signed)
Subjective:   Patient ID: [name], male   DOB: 86 y.o.   MRN: [id number]   HPI Patient states he started to develop pain again in his left big toenail and stated it started around 3 weeks ago when he had 6 months relief.  States that he is unsure whether he should have the nail removed permanently   ROS      Objective:  Physical Exam  Neurovascular status is found to be intact and unchanged and patient is found to have a thickened deformed hallux nail left acute very tender and is irritating with shoe gear     Assessment:  Chronic damaged hallux nail left that is been painful and responded to removal but regrowth     Plan:  H&P spent a great deal time going over pros and cons of permanent correction versus a temporary removal.  He is opted for 1 more attempt removal we may decide to do permanent correction as active do think he has the blood flow for healing.  I went ahead today I infiltrated the left hallux 60 mg like Marcaine mixture sterile prep applied to the toe and using sterile instrumentation remove the hallux nail clean the surface applied sterile dressing instructed on open toed shoes and reappoint when symptomatic and will decide if anything else is necessary

## 2019-11-08 NOTE — Progress Notes (Signed)
Remote pacemaker transmission.   

## 2019-11-16 ENCOUNTER — Ambulatory Visit (HOSPITAL_BASED_OUTPATIENT_CLINIC_OR_DEPARTMENT_OTHER)
Admission: RE | Admit: 2019-11-16 | Discharge: 2019-11-16 | Disposition: A | Discharge status: Home / Self Care | Payer: Medicare Other | Source: Ambulatory Visit | Attending: Cardiology | Admitting: Cardiology

## 2019-11-16 ENCOUNTER — Other Ambulatory Visit: Payer: Self-pay

## 2019-11-16 ENCOUNTER — Telehealth (HOSPITAL_COMMUNITY): Payer: Self-pay | Admitting: Pharmacist

## 2019-11-16 ENCOUNTER — Ambulatory Visit (HOSPITAL_COMMUNITY)
Admission: RE | Admit: 2019-11-16 | Discharge: 2019-11-16 | Disposition: A | Discharge status: Home / Self Care | Payer: Medicare Other | Source: Ambulatory Visit | Attending: Cardiology | Admitting: Cardiology

## 2019-11-16 ENCOUNTER — Telehealth (HOSPITAL_COMMUNITY): Payer: Self-pay | Admitting: Pharmacy Technician

## 2019-11-16 VITALS — BP 102/60 | HR 73 | Ht 65.0 in | Wt 154.0 lb

## 2019-11-16 DIAGNOSIS — I2510 Atherosclerotic heart disease of native coronary artery without angina pectoris: Secondary | ICD-10-CM | POA: Insufficient documentation

## 2019-11-16 DIAGNOSIS — E785 Hyperlipidemia, unspecified: Secondary | ICD-10-CM | POA: Insufficient documentation

## 2019-11-16 DIAGNOSIS — Z950 Presence of cardiac pacemaker: Secondary | ICD-10-CM | POA: Insufficient documentation

## 2019-11-16 DIAGNOSIS — M353 Polymyalgia rheumatica: Secondary | ICD-10-CM | POA: Insufficient documentation

## 2019-11-16 DIAGNOSIS — I4891 Unspecified atrial fibrillation: Secondary | ICD-10-CM | POA: Diagnosis not present

## 2019-11-16 DIAGNOSIS — Z7901 Long term (current) use of anticoagulants: Secondary | ICD-10-CM | POA: Diagnosis not present

## 2019-11-16 DIAGNOSIS — N400 Enlarged prostate without lower urinary tract symptoms: Secondary | ICD-10-CM | POA: Diagnosis not present

## 2019-11-16 DIAGNOSIS — I442 Atrioventricular block, complete: Secondary | ICD-10-CM | POA: Diagnosis not present

## 2019-11-16 DIAGNOSIS — I428 Other cardiomyopathies: Secondary | ICD-10-CM | POA: Diagnosis not present

## 2019-11-16 DIAGNOSIS — K219 Gastro-esophageal reflux disease without esophagitis: Secondary | ICD-10-CM | POA: Diagnosis not present

## 2019-11-16 DIAGNOSIS — Z87891 Personal history of nicotine dependence: Secondary | ICD-10-CM | POA: Diagnosis not present

## 2019-11-16 DIAGNOSIS — Z7984 Long term (current) use of oral hypoglycemic drugs: Secondary | ICD-10-CM | POA: Insufficient documentation

## 2019-11-16 DIAGNOSIS — Z79899 Other long term (current) drug therapy: Secondary | ICD-10-CM | POA: Diagnosis not present

## 2019-11-16 DIAGNOSIS — I4819 Other persistent atrial fibrillation: Secondary | ICD-10-CM | POA: Insufficient documentation

## 2019-11-16 DIAGNOSIS — I5022 Chronic systolic (congestive) heart failure: Secondary | ICD-10-CM | POA: Insufficient documentation

## 2019-11-16 DIAGNOSIS — I6521 Occlusion and stenosis of right carotid artery: Secondary | ICD-10-CM | POA: Diagnosis not present

## 2019-11-16 LAB — ECHOCARDIOGRAM COMPLETE
Area-P 1/2: 2.01 cm2
P 1/2 time: 786 msec
S' Lateral: 5 cm

## 2019-11-16 LAB — BASIC METABOLIC PANEL
Anion gap: 11 (ref 5–15)
BUN: 25 mg/dL — ABNORMAL HIGH (ref 8–23)
CO2: 23 mmol/L (ref 22–32)
Calcium: 9.3 mg/dL (ref 8.9–10.3)
Chloride: 102 mmol/L (ref 98–111)
Creatinine, Ser: 1.05 mg/dL (ref 0.61–1.24)
GFR calc Af Amer: >60 mL/min (ref >60)
GFR calc non Af Amer: >60 mL/min (ref >60)
Glucose, Bld: 90 mg/dL (ref 70–99)
Potassium: 4.1 mmol/L (ref 3.5–5.1)
Sodium: 136 mmol/L (ref 135–145)

## 2019-11-16 MED ORDER — EMPAGLIFLOZIN 10 MG PO TABS
10.0000 mg | ORAL_TABLET | Freq: Every day | ORAL | 6 refills | Status: DC
Start: 2019-11-16 — End: 2020-01-01

## 2019-11-16 MED ORDER — FUROSEMIDE 80 MG PO TABS: 80.0000 mg | ORAL_TABLET | Freq: Every day | ORAL | 3 refills | Status: DC

## 2019-11-16 NOTE — Telephone Encounter (Signed)
Patient was seen in clinic today and started on Jardiance. The 30 day co-pay is $129 (patient is in the coverage gap), so an application for [employer] Cares was started.   Will fax in once signatures obtained.

## 2019-11-16 NOTE — Progress Notes (Signed)
ID:  [name], DOB 12/28/1929, MRN [id number]   Provider location: [facility] Type of Visit: Established patient   PCP:  [name], MD  Cardiologist:  No primary care provider on file. Primary HF: Dr. Aundra Dubin   History of Present Illness: [name] is a 86 y.o. male who has a history of paroxysmal atrial fibrillation, nonischemic cardiomyopathy, and complete heart block with St Jude CRT-P system.  He was admitted in 4/14 with pacemaker pocket infection.  His pacemaker was removed and temporary-permanent device was placed.  Later, he had the temporary-permanent device removed and a new CRT-P device was placed. He developed dyspnea post-operatively and was found to have a large right-sided pleural effusion.  He was admitted and got a chest tube for drainage. He had followup with Dr. Servando Snare and it was decided that he would not need VATS.  He had been on amiodarone for maintenance of NSR.  This had been more successful than Tikosyn.   Around 3/15, he started developing increasing exertional dyspnea.  He was short of breath walking up a hill or incline.  He could still walk on the treadmill for exercise for 10-15 minutes and use the elliptical without much trouble on most days.  No orthopnea or PND.  No chest pain.  He saw Dr Leanne Chang and was started on Lasix three times a week due to concern for volume overload.  This did not seem to have helped much.  After that, he had PFTs done which showed normal spirometry but DLCO 50% predicted.  Therefore, amiodarone was stopped due to concern for lung toxicity.  He saw Richardson Dopp for a pre-operative evaluation prior to right inguinal hernia repair.  Given the exertional dyspnea, he was set up for adenosine Cardiolite in 5/15.  This showed EF 36% with a small area of primarily scar in the apex, apical lateral wall, and mid anterolateral wall.  He has not felt palpitations. Echo in 5/15 showed EF 40-45%, similar to  prior study.   I had him see pulmonary for evaluation for amiodarone lung toxicity.  CT chest looked ok, and he was not thought to have amiodarone lung toxicity.   At a prior appointment in 2016, [name] was noted to be back in atrial fibrillation.  He was more short of breath with exertion and fatigued.  He has historically tolerated atrial fibrillation poorly.  I did a TEE-guided DCCV in 5/16 back to NSR.  TEE showed EF 35-40%.  He did not immediately feel better like he has in the past post-DCCV, so Lasix was increased.    Recurrent symptomatic atrial fibrillation in 8/17 with TEE-guided DCCV to NSR.  TEE showed EF 25-30%.  Recurrent symptomatic atrial fibrillation with CHF exacerbation in 9/17.  He had repeat DCCV and Lasix was increased.   He was back in atrial fibrillation in 11/17 and appears to have been out of rhythm since then.  He was more short of breath in atrial fibrillation with a significant fall in BiV pacing percentage.  Lasix was significantly increased to try to cope with volume overload.  In 2/18, he had AV nodal ablation to promote BiV pacing.    Admitted 5/19 with right eye lower visual field loss, found to have severe right carotid stenosis. S/p R CEA on 07/26/17. Switched from coumadin to Eliquis. Volume was stable.  Echo in 5/19 showed EF 30-35%, mild AI.   Echo in 6/20 showed EF 30-35% with mildly decreased  RV systolic function.  PYP scan 11/20 was not suggestive of ATTR amyloidosis.  CPX (9/20) showed moderate functional deficit due to CHF.  Echo was done today and reviewed, EF 30-35%, lateral akinesis, mildly decreased RV systolic function, moderate MR.   Patient returns for followup of CHF.  He is doing water aerobics three times a week. He feels like he is a little more short of breath walking up hills but has not been exercising as much.  No chest pain.  No orthopnea/PND.  No lightheadedness or falls.  Plans to start riding exercise bike. Weight is stable.   St  Jude device interrogation: 94% BiV pacing  Labs (5/19): LDL 72, HDL 41, K 4.2, creatinine 0.86 Labs (10/19): K 3.9, creatinine 0.96 Labs (12/19): K 4.4, creatinine 0.99, LDL 63, HDl 41 Labs (3/20): K 4.4, creatinine 1.03 Labs (6/20): TSH normal, K 3.8, creatinine 0.97 Labs (9/20): Myeloma panel negative, urine immunofixation negative, K 3.7, creatinine 0.93 Labs (11/20): LDL 43 Labs (2/21): K 3.9, creatinine 1.02 Labs (5/21): K 4.1, creatinine 1.05, BNP 382  PMH: 1. Low back pain 2. Hyperlipidemia 3. GERD 4. BPH 5. Polymyalgia rheumatica 6. Atrial fibrillation: Failed Tikosyn in the past.  Paroxysmal, on amiodarone and warfarin.  DCCV in 4/13, 7/13, 10/13.  Has held NSR with amiodarone.  However, concern for amiodarone lung toxicity: PFTs (4/15) with FVC 90% predicted, FEV1 90%, ratio 97%, DLCO 50%.   Recurrence of atrial fibrillation in 5/16, TEE-guided DCCV back to NSR in 5/16.  - Recurrence of atrial fibrillation in 8/17. TEE-guided DCCV 11/07/15.  - Recurrence of atrial fibrillation 9/17.  DCCV 11/20/15 but back in atrial fibrillation by 11/26/15.  - Amiodarone stopped due to persistent atrial fibrillation.  - AV nodal ablation in 2/18 to promote BiV pacing.  7. Transaminitis: Mild, attributed to amiodarone.  8. Complete heart block s/p St Jude CRT-P device.  Patient developed PCM pocket infection in 4/14 and had his first system removed with placement of a temporary permanent PCM.  He later had CRT-P device replaced.  This was complicated by large right-sided hemorrhagic pleural effusion requiring chest tube.   9. Nonischemic cardiomyopathy: Echo (9/13) with EF 40-45%, diffuse HK, mild MR.  Mild CAD only on prior LHC.  Echo (4/14) with EF 45-50%.  Adenosine Cardiolite (5/15) with EF 36% (visually appeared higher per report), small area of scar with peri-infarct ischemia in the apex, apical lateral wall, and mid anterolateral wall. Echo (5/15) with EF 40-45%, basal to mid inferolateral  severe hypokinesis, basal to mid anterolateral hypokinesis, mildly dilated RV with mildly decreased RV systolic function, mild to moderate MR.  TEE (5/16) with EF 35-40%, inferior and inferolateral severe hypokinesis, normal RV size with mildly decreased systolic function, moderate MR, peak RV-RA gradient 26 mmHg.  - TEE (8/17): EF 25-30%, mildly dilated RV with mildly decreased systolic function, moderate biatrial enlargement, moderate central MR.  - AV nodal ablation 2/18.  - Echo (5/19): EF 30-35%, mild AI.  - Echo (6/20): EF 30-35%, mild LV dilation, mild RV enlargement with mildly decreased systolic function, mild MR.  - CPX (9/20): Peak VO2 14.4, VE/VCO2 48, RER 1.16 => moderate HF limitation.  - Echo (9/21): EF 30-35%, lateral akinesis, mildly decreased RV systolic function, moderate MR.  10. Large right pleural effusion requiring chest tube following removal and reimplantation of PCM. Most recent chest CT in 6/15 showed significant decrease in size of loculated pleural effusion since 5/14.   11. Right CEA 5/19.  12. Right  central retinal artery occlusion in 5/19.  13. PYP scan (11/20): Grade 1, H/CL 1.2 => no evidence for ATTR amyloidosis.  14. Abdominal US (7/21): No AAA.   SH: Widower, prior smoker (quit 1985), no ETOH x years, retired, lives at [facility]  [place]: Father with MI at 43, mother with AAA.   Review of systems complete and found to be negative unless listed in HPI.   Current Outpatient Medications  Medication Sig Dispense Refill  . acetaminophen (TYLENOL) 500 MG tablet Take 1,000 mg by mouth every 6 (six) hours as needed for moderate pain or headache.    Marland Kitchen apixaban (ELIQUIS) 5 MG TABS tablet Take 1 tablet (5 mg total) by mouth 2 (two) times daily. 180 tablet 3  . dutasteride (AVODART) 0.5 MG capsule Take 0.5 mg by mouth daily.     . famotidine (PEPCID) 20 MG tablet Take 20 mg by mouth 2 (two) times daily.    . fluticasone (FLONASE) 50 MCG/ACT nasal spray Place 2 sprays  into both nostrils daily as needed for allergies or rhinitis. 16 g 3  . furosemide (LASIX) 80 MG tablet Take 1 tablet (80 mg total) by mouth daily. Take 80mg  (1 tablet) Daily 30 tablet 3  . hydroxypropyl methylcellulose / hypromellose (ISOPTO TEARS / GONIOVISC) 2.5 % ophthalmic solution Place 1 drop into both eyes 3 (three) times daily as needed for dry eyes.     Marland Kitchen losartan (COZAAR) 25 MG tablet Take 0.5 tablets (12.5 mg total) by mouth daily. 45 tablet 3  . metoprolol succinate (TOPROL-XL) 25 MG 24 hr tablet Take 25 mg by mouth daily.    . rosuvastatin (CRESTOR) 10 MG tablet TAKE 1 TABLET BY MOUTH EVERYDAY AT BEDTIME 90 tablet 3  . spironolactone (ALDACTONE) 25 MG tablet Take 1 tablet (25 mg total) by mouth daily. 90 tablet 3  . tamsulosin (FLOMAX) 0.4 MG CAPS capsule Take 0.4 mg by mouth 2 (two) times daily.     Marland Kitchen triamcinolone cream (KENALOG) 0.1 % Apply 1 application topically 2 (two) times daily. For 7-10 days maximum 28.4 g 0  . empagliflozin (JARDIANCE) 10 MG TABS tablet Take 1 tablet (10 mg total) by mouth daily before breakfast. 30 tablet 6   No current facility-administered medications for this encounter.    BP 102/60   Pulse 73   Ht 5\' 5"  (1.651 m)   Wt 69.9 kg (154 lb)   SpO2 98%   BMI 25.63 kg/m   Wt Readings from Last 3 Encounters:  11/16/19 69.9 kg (154 lb)  09/01/19 70.8 kg (156 lb 2 oz)  08/28/19 68.4 kg (150 lb 14.4 oz)   Exam:   BP 102/60   Pulse 73   Ht 5\' 5"  (1.651 m)   Wt 69.9 kg (154 lb)   SpO2 98%   BMI 25.63 kg/m  General: NAD Neck: No JVD, no thyromegaly or thyroid nodule.  Lungs: Clear to auscultation bilaterally with normal respiratory effort. CV: Nondisplaced PMI.  Heart regular S1/S2, no S3/S4, no murmur.  No peripheral edema.  No carotid bruit.  Normal pedal pulses.  Abdomen: Soft, nontender, no hepatosplenomegaly, no distention.  Skin: Intact without lesions or rashes.  Neurologic: Alert and oriented x 3.  Psych: Normal affect. Extremities: No  clubbing or cyanosis.  HEENT: Normal.   Assessment/Plan: 1. Chronic systolic CHF:  Presumed nonischemic cardiomyopathy (prior cath with only mild CAD).  He had an adenosine Cardiolite in 5/15 with EF 36% and possible small area of apical and lateral  scar with peri-infarct ischemia. He has St Jude CRT-P.  Echo in 6/20 showed EF 30-35%, echo in 9/21 with EF 30-35%. CPX (9/20) with moderate HF limitation. NYHA class II, weight stable. He has noted increased dyspnea walking up hills recently.  I think this may be due to deconditioning (has not been exercising as much). Med titration limited by orthostatic symptoms. He is not volume overloaded on exam.  - I am going to have him decrease Lasix to 80 mg daily and start Farxiga 10 mg daily. BMET today and again in 10 days.     - Continue Toprol XL 25 mg daily.  - Continue spironolactone 25 mg daily.  - Continue losartan 12.5 mg daily.   2. Atrial fibrillation: Now persistent.  Had breakthrough with Tikosyn and now not able to hold NSR on amiodarone. He is now off amiodarone.  He is status post AV nodal ablation with significant improvement in symptoms and BiV pacing percentage (94% on interrogation today).  - Continue apixiban 5 mg BID. 3. Hyperlipidemia: History of nonobstructive CAD and recent CEA.    - Continue Crestor, good lipids in 11/20.  4. Rt CEA occlusion s/p R CEA 07/26/17: He had an ulcerated, 70% stenotic plaque in the right internal carotid. Suspect this is the source of embolism for CRAO.     - Continue statin.  - Followed at [facility].   Followup in 3 months   Signed, [name], MD  11/16/2019  [facility] [name] [facility] [place] [address] [phone] (office) [phone] (fax)

## 2019-11-16 NOTE — Progress Notes (Signed)
Medication Samples have been provided to the patient.  Drug name: Jardiance       Strength: 10mg         Qty: 4  LOT: [id number]  Exp.Date: 2/22  Dosing instructions: Take 1 tab Daily  The patient has been instructed regarding the correct time, dose, and frequency of taking this medication, including desired effects and most common side effects.   Tyrianna Lightle 11:20 AM 11/16/2019    Medication Samples have been provided to the patient.  Drug name: Eliquis       Strength: 5mg         Qty: 3  LOT: [id number]  Exp.Date: 8/23  Dosing instructions: Take 1 tab Twice daily   The patient has been instructed regarding the correct time, dose, and frequency of taking this medication, including desired effects and most common side effects.   Karo Rog 11:21 AM 11/16/2019     Pt completed [employer] pt assist application to get assist with cost of Jardiance

## 2019-11-16 NOTE — Telephone Encounter (Signed)
Sent in Manufacturer's Assistance application to [employer] for Jardiance.   Application pending, will continue to follow.  Ponce Skillman, PharmD, BCPS, BCCP, CPP Heart Failure Clinic Pharmacist [phone]  

## 2019-11-16 NOTE — Progress Notes (Signed)
  Echocardiogram 2D Echocardiogram has been performed.  [name] 11/16/2019, 10:08 AM

## 2019-11-16 NOTE — Patient Instructions (Addendum)
DECREASE Lasix 80mg  (1 tablet) Daily  Start Jardiance 10 mg Daily   Labs done today, your results will be available in MyChart, we will contact you for abnormal readings.  Your physician recommends that you return for labs in 10-14 days  Your physician recommends that you follow up with our office in 3 months  If you have any questions or concerns before your next appointment please send [place] a message through [place] or call our office at [phone].    TO LEAVE A MESSAGE FOR THE NURSE SELECT OPTION 2, PLEASE LEAVE A MESSAGE INCLUDING: . YOUR NAME . DATE OF BIRTH . CALL BACK NUMBER . REASON FOR CALL**this is important as we prioritize the call backs  [place] AS LONG AS YOU CALL BEFORE 4:00 PM  At the [facility], you and your health needs are our priority. As part of our continuing mission to provide you with exceptional heart care, we have created designated Provider Care Teams. These Care Teams include your primary Cardiologist (physician) and Advanced Practice Providers (APPs- Physician Assistants and Nurse Practitioners) who all work together to provide you with the care you need, when you need it.   You may see any of the following providers on your designated Care Team at your next follow up: Marland Kitchen Dr Glori Bickers . Dr [name] . Darrick Grinder, NP . Lyda Jester, PA . Audry Riles, PharmD   Please be sure to bring in all your medications bottles to every appointment.

## 2019-11-20 ENCOUNTER — Telehealth (HOSPITAL_COMMUNITY): Payer: Self-pay | Admitting: Pharmacy Technician

## 2019-11-20 DIAGNOSIS — N401 Enlarged prostate with lower urinary tract symptoms: Secondary | ICD-10-CM | POA: Diagnosis not present

## 2019-11-20 DIAGNOSIS — K869 Disease of pancreas, unspecified: Secondary | ICD-10-CM | POA: Diagnosis not present

## 2019-11-20 DIAGNOSIS — R3914 Feeling of incomplete bladder emptying: Secondary | ICD-10-CM | POA: Diagnosis not present

## 2019-11-20 DIAGNOSIS — R3121 Asymptomatic microscopic hematuria: Secondary | ICD-10-CM | POA: Diagnosis not present

## 2019-11-20 NOTE — Telephone Encounter (Signed)
Spoke with patient, he is going to get his OOP expense report from his pharmacy and will send it to me if it is 3% or more of his income. I have the BMS application already printed for him to sign.  Charlann Boxer, CPhT

## 2019-11-20 NOTE — Telephone Encounter (Signed)
Advanced Heart Failure Patient Advocate Encounter   Patient was approved to receive Jardiance from [facility].  Effective dates: 11/19/19 through 03/08/20  Called and left message regarding approval. Advised patient to contact me if he needed assistance with Eliquis as well.  Charlann Boxer, CPhT

## 2019-11-29 ENCOUNTER — Other Ambulatory Visit: Payer: Self-pay

## 2019-11-29 ENCOUNTER — Ambulatory Visit (HOSPITAL_COMMUNITY)
Admission: RE | Admit: 2019-11-29 | Discharge: 2019-11-29 | Disposition: A | Discharge status: Home / Self Care | Payer: Medicare Other | Source: Ambulatory Visit | Attending: Cardiology | Admitting: Cardiology

## 2019-11-29 DIAGNOSIS — I5022 Chronic systolic (congestive) heart failure: Secondary | ICD-10-CM | POA: Insufficient documentation

## 2019-11-29 LAB — BASIC METABOLIC PANEL
Anion gap: 10 (ref 5–15)
BUN: 27 mg/dL — ABNORMAL HIGH (ref 8–23)
CO2: 25 mmol/L (ref 22–32)
Calcium: 9 mg/dL (ref 8.9–10.3)
Chloride: 101 mmol/L (ref 98–111)
Creatinine, Ser: 1.05 mg/dL (ref 0.61–1.24)
GFR calc Af Amer: >60 mL/min (ref >60)
GFR calc non Af Amer: >60 mL/min (ref >60)
Glucose, Bld: 99 mg/dL (ref 70–99)
Potassium: 4 mmol/L (ref 3.5–5.1)
Sodium: 136 mmol/L (ref 135–145)

## 2019-12-04 DIAGNOSIS — R05 Cough: Secondary | ICD-10-CM | POA: Diagnosis not present

## 2019-12-22 ENCOUNTER — Telehealth: Payer: Self-pay

## 2019-12-22 NOTE — Progress Notes (Signed)
Chronic Care Management Pharmacy Assistant   Name: [name]  MRN: [id number] DOB: April 26, 1929  Reason for Encounter: Initial Visit  Patient Questions:  1.  Have you seen any other providers since your last visit? - Patient states he's seen Dr. Aundra Dubin his cardiologist.   2.  Any changes in your medicines or health? - Patient states that Lasix was reduced to 80 mg once daily. Patient states he takes Lasix every morning. Patient also states that he was started on Jardiance 10 mg and states that he takes this medication everyday at night.  [name],  86 y.o. , male presents for their Initial CCM visit with the clinical pharmacist via telephone.  PCP : [name], MD  Allergies:   Allergies  Allergen Reactions   Antihistamines, Diphenhydramine-Type Other (See Comments)    Causes difficulty in ability to urinate.    Medications: Outpatient Encounter Medications as of 12/22/2019  Medication Sig   acetaminophen (TYLENOL) 500 MG tablet Take 1,000 mg by mouth every 6 (six) hours as needed for moderate pain or headache.   apixaban (ELIQUIS) 5 MG TABS tablet Take 1 tablet (5 mg total) by mouth 2 (two) times daily.   dutasteride (AVODART) 0.5 MG capsule Take 0.5 mg by mouth daily.    empagliflozin (JARDIANCE) 10 MG TABS tablet Take 1 tablet (10 mg total) by mouth daily before breakfast.   famotidine (PEPCID) 20 MG tablet Take 20 mg by mouth 2 (two) times daily.   fluticasone (FLONASE) 50 MCG/ACT nasal spray Place 2 sprays into both nostrils daily as needed for allergies or rhinitis.   furosemide (LASIX) 80 MG tablet Take 1 tablet (80 mg total) by mouth daily. Take 80mg  (1 tablet) Daily   hydroxypropyl methylcellulose / hypromellose (ISOPTO TEARS / GONIOVISC) 2.5 % ophthalmic solution Place 1 drop into both eyes 3 (three) times daily as needed for dry eyes.    losartan (COZAAR) 25 MG tablet Take 0.5 tablets (12.5 mg total) by mouth daily.   metoprolol  succinate (TOPROL-XL) 25 MG 24 hr tablet Take 25 mg by mouth daily.   rosuvastatin (CRESTOR) 10 MG tablet TAKE 1 TABLET BY MOUTH EVERYDAY AT BEDTIME   spironolactone (ALDACTONE) 25 MG tablet Take 1 tablet (25 mg total) by mouth daily.   tamsulosin (FLOMAX) 0.4 MG CAPS capsule Take 0.4 mg by mouth 2 (two) times daily.    triamcinolone cream (KENALOG) 0.1 % Apply 1 application topically 2 (two) times daily. For 7-10 days maximum   No facility-administered encounter medications on file as of 12/22/2019.    Current Diagnosis: Patient Active Problem List   Diagnosis Date Noted   History of melanoma 05/01/2019   Incomplete passage of stool 04/06/2018   Trochanteric bursitis of right hip 09/29/2017   History of central retinal artery occlusion 08/04/2017   Stenosis of right internal carotid artery s/p endarterectomy 07/2017    Thrombus of left atrial appendage without antecedent myocardial infarction 07/24/2017   Traumatic hematoma 04/01/2017   Left knee pain 10/23/2016   Prolapsed internal hemorrhoids, grade 2 - causing fecal seepage 06/10/2016   Chronic anticoagulation 02/10/2016   AK (actinic keratosis) 01/28/2016   Left breast lump 07/15/2015   Lipoma 07/08/2015   Neuropathy 05/16/2015   Trigger thumb of right hand 07/04/2014   Encounter for therapeutic drug monitoring 07/02/2014   Allergic rhinitis 02/14/2014   Fecal incontinence 10/18/2013   Chronic cough 08/18/2013   Pleural effusion, right 46/27/0350   Chronic systolic CHF (congestive  heart failure) ([place]) 03/21/2013   Recurrent right inguinal hernia 11/22/2012   Intention tremor 11/01/2012   Pleural effusion on right 07/06/2012   Nonischemic cardiomyopathy ([place]) 09/20/2010   PPM-St.Jude 11/27/2008   CAD (coronary artery disease) 07/20/2008   SICK SINUS SYNDROME 07/20/2008   History of polymyalgia rheumatica 07/03/2008   MIXED HEARING LOSS BILATERAL 01/10/2008   Hyperlipidemia 03/16/2007    Atrial fibrillation ([place]) 03/16/2007   GERD 03/16/2007   BPH (benign prostatic hyperplasia) 03/16/2007     Have you seen any other providers since your last visit?   -Patient states he's seen Dr. Aundra Dubin his cardiologist.  Any changes in your medications or health?    -Patient states that Lasix was reduced to 80 mg once daily. Patient states he takes Lasix every morning. Patient also states that he was started on Jardiance 10 mg and states that he takes this medication everyday at night.  Any side effects from any medications?    -Patient states  he does not have any side effects from medications at this moment.  Do you have an symptoms or problems not managed by your medications?   - Patient states he does not have any problems or sypmtoms at this moment.  Any concerns about your health right now?    -Patient states no concerns at this time.  Has your provider asked that you check blood pressure, blood sugar, or follow special diet at home?   - Patient states he does not follow a diet. He also states he does not eat a lot of salt.   Do you get any type of exercise on a regular basis? -Patient states he attends water aerobics class 3 times per week, he attends balance class two times per week, and patient states that he walks a lot.  Can you think of a goal you would like to reach for your health?    -Patient states he would like to keep his balance and not fall.  Do you have any problems getting yourmedications?   -Patient states he does not have problems getting his medications at this time.  Is there anything that you would like to discuss during the appointment?  - Patient does not have any additional information to discuss at this time.  Please bring medications and supplements to appointment   Georgiana Shore ,[facility] Clinical Pharmacist Assistant [phone]   Follow-Up:  Pharmacist Review

## 2019-12-27 ENCOUNTER — Telehealth: Payer: Medicare Other

## 2019-12-29 ENCOUNTER — Other Ambulatory Visit: Payer: Self-pay | Admitting: Family Medicine

## 2020-01-01 ENCOUNTER — Other Ambulatory Visit (HOSPITAL_COMMUNITY): Payer: Self-pay

## 2020-01-01 ENCOUNTER — Telehealth: Payer: Medicare Other

## 2020-01-01 MED ORDER — EMPAGLIFLOZIN 10 MG PO TABS: 10.0000 mg | ORAL_TABLET | Freq: Every day | ORAL | 6 refills | Status: DC

## 2020-01-02 ENCOUNTER — Telehealth: Payer: Self-pay

## 2020-01-02 DIAGNOSIS — Z23 Encounter for immunization: Secondary | ICD-10-CM | POA: Diagnosis not present

## 2020-01-02 NOTE — Progress Notes (Signed)
Called patient and left a voice message to remind him of telephone visit tomorrow at 10:30 a.m.    Georgiana Shore ,[place] Pharmacist Assistant [phone]

## 2020-01-03 ENCOUNTER — Ambulatory Visit: Payer: Medicare Other

## 2020-01-03 DIAGNOSIS — E785 Hyperlipidemia, unspecified: Secondary | ICD-10-CM

## 2020-01-03 DIAGNOSIS — I5022 Chronic systolic (congestive) heart failure: Secondary | ICD-10-CM

## 2020-01-03 DIAGNOSIS — I4811 Longstanding persistent atrial fibrillation: Secondary | ICD-10-CM

## 2020-01-03 NOTE — Progress Notes (Signed)
Chronic Care Management Pharmacy  Name: [name]  MRN: [id number]  DOB: 1930/01/22  Chief Complaint/ HPI [name], 86 y.o., male, presents for their initial CCM visit with the clinical pharmacist via telephone due to COVID-19 pandemic .  PCP : [name], MD Encounter Diagnoses  Name Primary?  . Chronic systolic CHF (congestive heart failure) ([place])   . Hyperlipidemia, unspecified hyperlipidemia type   . Longstanding persistent atrial fibrillation ([place])     Office Visits:  05/01/2019 (PCP): chronic shoulder pain, referral to PT offered.  Consult Visit: 11/16/2019 (Dr Aundra Dubin): CHF f/u. Lasix decreased to 80 mg daily, Jardiance started for HF. Next visit 3 months.  Patient Active Problem List   Diagnosis Date Noted  . History of melanoma 05/01/2019  . Incomplete passage of stool 04/06/2018  . Trochanteric bursitis of right hip 09/29/2017  . History of central retinal artery occlusion 08/04/2017  . Stenosis of right internal carotid artery s/p endarterectomy 07/2017   . Thrombus of left atrial appendage without antecedent myocardial infarction 07/24/2017  . Traumatic hematoma 04/01/2017  . Left knee pain 10/23/2016  . Prolapsed internal hemorrhoids, grade 2 - causing fecal seepage 06/10/2016  . Chronic anticoagulation 02/10/2016  . AK (actinic keratosis) 01/28/2016  . Left breast lump 07/15/2015  . Lipoma 07/08/2015  . Neuropathy 05/16/2015  . Trigger thumb of right hand 07/04/2014  . Encounter for therapeutic drug monitoring 07/02/2014  . Allergic rhinitis 02/14/2014  . Fecal incontinence 10/18/2013  . Chronic cough 08/18/2013  . Pleural effusion, right 08/16/2013  . Chronic systolic CHF (congestive heart failure) ([place]) 03/21/2013  . Recurrent right inguinal hernia 11/22/2012  . Intention tremor 11/01/2012  . Pleural effusion on right 07/06/2012  . Nonischemic cardiomyopathy ([place]) 09/20/2010  . PPM-[place] 11/27/2008  . CAD (coronary artery disease)  07/20/2008  . SICK SINUS SYNDROME 07/20/2008  . History of polymyalgia rheumatica 07/03/2008  . MIXED HEARING LOSS BILATERAL 01/10/2008  . Hyperlipidemia 03/16/2007  . Atrial fibrillation ([place]) 03/16/2007  . GERD 03/16/2007  . BPH (benign prostatic hyperplasia) 03/16/2007   Past Surgical History:  Procedure Laterality Date  . AV NODE ABLATION N/A 04/20/2016   Procedure: AV Node Ablation;  Surgeon: Evans Lance, MD;  Location: [place] [facility];  Service: Cardiovascular;  Laterality: N/A;  . BASAL CELL CARCINOMA EXCISION  2021   ear  . CARDIAC CATHETERIZATION  1980's   [place]  . CARDIOVERSION  06/17/2011   Procedure: CARDIOVERSION;  Surgeon: Lelon Perla, MD;  Location: [place];  Service: Cardiovascular;  Laterality: N/A;  . CARDIOVERSION  09/09/2011   Procedure: CARDIOVERSION;  Surgeon: Carlena Bjornstad, MD;  Location: [place];  Service: Cardiovascular;  Laterality: N/A;  . CARDIOVERSION  01/01/2012   Procedure: CARDIOVERSION;  Surgeon: Hillary Bow, MD;  Location: [facility] ENDOSCOPY;  Service: Cardiovascular;  Laterality: N/A;  . CARDIOVERSION N/A 08/03/2014   Procedure: CARDIOVERSION;  Surgeon: Larey Dresser, MD;  Location: [facility] ENDOSCOPY;  Service: Cardiovascular;  Laterality: N/A;  . CARDIOVERSION N/A 11/07/2015   Procedure: CARDIOVERSION;  Surgeon: Larey Dresser, MD;  Location: [facility] ENDOSCOPY;  Service: Cardiovascular;  Laterality: N/A;  . CARDIOVERSION N/A 11/20/2015   Procedure: CARDIOVERSION;  Surgeon: Larey Dresser, MD;  Location: [place];  Service: Cardiovascular;  Laterality: N/A;  . CAROTID ENDARTERECTOMY    . CATARACT EXTRACTION, BILATERAL Bilateral 1980's  . COLONOSCOPY N/A 01/03/2014   Procedure: COLONOSCOPY;  Surgeon: Gatha Mayer, MD;  Location: [facility] ENDOSCOPY;  Service: Endoscopy;  Laterality:  N/A;  . ENDARTERECTOMY Right 07/26/2017   Procedure: RIGHT CAROTID  ARTERY ENDARTERECTOMY;  Surgeon: Rosetta Posner, MD;  Location: [facility] OR;  Service: Vascular;  Laterality:  Right;  . ESOPHAGOGASTRODUODENOSCOPY    . GENERATOR REMOVAL Left 06/15/2012   Procedure: GENERATOR REMOVAL;  Surgeon: Evans Lance, MD;  Location: [place];  Service: Cardiovascular;  Laterality: Left;  . HEMORRHOID BANDING    . INGUINAL HERNIA REPAIR Right 2012; 03/20/2014  . INGUINAL HERNIA REPAIR Right 03/20/2014   Procedure: OPEN REPAIR OF RIGHT INGUINAL WITH MESH;  Surgeon: Donnie Mesa, MD;  Location: [place];  Service: General;  Laterality: Right;  . INSERT / REPLACE / REMOVE PACEMAKER    . INSERTION OF MESH Right 03/20/2014   Procedure: INSERTION OF MESH;  Surgeon: Donnie Mesa, MD;  Location: [place];  Service: General;  Laterality: Right;  . KNEE ARTHROSCOPY WITH LATERAL MENISECTOMY Left 12/16/2016   Procedure: KNEE ARTHROSCOPY WITH LATERAL MENISECTOMY and Chondroplasty;  Surgeon: Earlie Server, MD;  Location: [place];  Service: Orthopedics;  Laterality: Left;  . LEAD REVISION N/A 06/29/2012   Procedure: LEAD REVISION;  Surgeon: Deboraha Sprang, MD;  Location: [facility] CATH LAB;  Service: Cardiovascular;  Laterality: N/A;  . MELANOMA EXCISION Right 2020   arm  . PACEMAKER GENERATOR CHANGE N/A 06/15/2012   Procedure: PACEMAKER GENERATOR CHANGE;  Surgeon: Evans Lance, MD;  Location: [place];  Service: Cardiovascular;  Laterality: N/A;  . PACEMAKER REVISION  06/30/2012   Procedure: PACEMAKER LEAD REVISION;  Surgeon: Evans Lance, MD;  Location: [facility] CATH LAB;  Service: Cardiovascular;;  . PARACENTESIS  ~ 11/929   complication from pacer change in 2014  . PATCH ANGIOPLASTY Right 07/26/2017   Procedure: WITH PATCH ANGIOPLASTY;  Surgeon: Rosetta Posner, MD;  Location: [place];  Service: Vascular;  Laterality: Right;  . PERMANENT PACEMAKER INSERTION N/A 06/28/2012   Procedure: PERMANENT PACEMAKER INSERTION;  Surgeon: Evans Lance, MD;  Location: [facility] CATH LAB;  Service: Cardiovascular;  Laterality: N/A;  . TEE WITHOUT CARDIOVERSION  01/01/2012   Procedure: TRANSESOPHAGEAL ECHOCARDIOGRAM (TEE);  Surgeon:  Fay Records, MD;  Location: [facility] ENDOSCOPY;  Service: Cardiovascular;  Laterality: N/A;  . TEE WITHOUT CARDIOVERSION N/A 08/03/2014   Procedure: TRANSESOPHAGEAL ECHOCARDIOGRAM (TEE);  Surgeon: Larey Dresser, MD;  Location: [place];  Service: Cardiovascular;  Laterality: N/A;  . TEE WITHOUT CARDIOVERSION N/A 11/07/2015   Procedure: TRANSESOPHAGEAL ECHOCARDIOGRAM (TEE);  Surgeon: Larey Dresser, MD;  Location: [place];  Service: Cardiovascular;  Laterality: N/A;  . TONSILLECTOMY AND ADENOIDECTOMY  1938  . UMBILICAL HERNIA REPAIR  [phone] X 3   Family History  Problem Relation Age of Onset  . Heart attack Father 68       deceased  . AAA (abdominal aortic aneurysm) Mother        deceased AAA  . Hypertension Mother   . Tuberculosis Mother   . Other Brother        deceased at birth  . Healthy Son   . Healthy Daughter   . Diabetes Cousin   . Esophageal cancer Neg Hx   . Colon cancer Neg Hx    Allergies  Allergen Reactions  . Antihistamines, Diphenhydramine-Type Other (See Comments)    Causes difficulty in ability to urinate.   Outpatient Encounter Medications as of 01/03/2020  Medication Sig  . acetaminophen (TYLENOL) 500 MG tablet Take 1,000 mg by mouth every 6 (six) hours as needed for moderate pain or headache.  Marland Kitchen apixaban (  ELIQUIS) 5 MG TABS tablet Take 1 tablet (5 mg total) by mouth 2 (two) times daily.  Marland Kitchen dutasteride (AVODART) 0.5 MG capsule Take 0.5 mg by mouth daily.   . empagliflozin (JARDIANCE) 10 MG TABS tablet Take 1 tablet (10 mg total) by mouth daily before breakfast.  . famotidine (PEPCID) 20 MG tablet TAKE 1 TABLET TWICE A DAY AS NEEDED FOR HEARTBURN OR INDIGESTION  . fluticasone (FLONASE) 50 MCG/ACT nasal spray Place 2 sprays into both nostrils daily as needed for allergies or rhinitis.  . furosemide (LASIX) 80 MG tablet Take 1 tablet (80 mg total) by mouth daily. Take 81m (1 tablet) Daily  . hydroxypropyl methylcellulose / hypromellose (ISOPTO TEARS /  GONIOVISC) 2.5 % ophthalmic solution Place 1 drop into both eyes 3 (three) times daily as needed for dry eyes.   .Marland Kitchenlosartan (COZAAR) 25 MG tablet Take 0.5 tablets (12.5 mg total) by mouth daily.  . metoprolol succinate (TOPROL-XL) 25 MG 24 hr tablet Take 25 mg by mouth daily.  . rosuvastatin (CRESTOR) 10 MG tablet TAKE 1 TABLET BY MOUTH EVERYDAY AT BEDTIME  . spironolactone (ALDACTONE) 25 MG tablet Take 1 tablet (25 mg total) by mouth daily.  . tamsulosin (FLOMAX) 0.4 MG CAPS capsule Take 0.4 mg by mouth 2 (two) times daily.   .Marland Kitchentriamcinolone cream (KENALOG) 0.1 % Apply 1 application topically 2 (two) times daily. For 7-10 days maximum   No facility-administered encounter medications on file as of 01/03/2020.   Patient Care Team    Relationship Specialty Notifications Start End  HMarin Olp MD PCP - General Family Medicine  02/14/14    Comment: MScarlette Ar MD Consulting Physician Cardiothoracic Surgery  08/27/12   MLarey Dresser MD Consulting Physician Cardiology  08/27/12   BCollene Gobble MD Consulting Physician Pulmonary Disease  08/27/12   TEvans Lance MD Consulting Physician Cardiology  08/27/12   PAlda Berthold DO Consulting Physician Neurology  06/06/19   GGatha Mayer MD Consulting Physician Gastroenterology  06/06/19   RWallene Huh DPM Consulting Physician Podiatry  06/06/19   MLuberta Mutter MD Consulting Physician Ophthalmology  06/06/19   PMadelin Rear RSt. Lukes Des Peres HospitalPharmacist Pharmacist  10/18/19    Comment: [phone]  Current Diagnosis/Assessment: Goals Addressed            This Visit's Progress   . PharmD Care Plan       CARE PLAN ENTRY (see longitudinal plan of care for additional care plan information)  Current Barriers:  . Chronic Disease Management support, education, and care coordination needs related to Hyperlipidemia, Atrial Fibrillation, and Heart Failure   Heart Failure / Atrial Fibrillation BP Readings from Last 3  Encounters:  11/16/19 102/60  09/01/19 (!) 100/50  08/28/19 (!) 88/62   . Pharmacist Clinical Goal(s): o Over the next 180 days, patient will work with PharmD and providers to maintain BP goal <130/80 o Over the next 180 days, patient will work with PharmD and providers to maintain HR goal 70s-80s . Current regimen:  o Metoprolol succinate (heart failure, blood pressure, rate control in atrial fibrillation) o Jardiance 10 mg once daily (heart failure) o Lasix 80 mg once daily (heart failure, blood pressure) o Losartan 25 mg once daily (heart failure, blood pressure) o Spironolactone 25 mg once daily (heart failure, blood pressure) o Eliquis 5 mg twice daily (stroke prevention in atrial fibrillation) . Interventions: o Reviewed signs of bleed such as blood in urine, black stools o Patient  assistance program application for Jardiance . Patient self care activities - Over the next 180 days, patient will: o Check BP as directed, document, and provide at future appointments o Ensure daily salt intake < 1600 mg/day o Complete pt section of Jardiance patient assistance program application and drop off at Dr Claris Gladden office  Hyperlipidemia Lab Results  Component Value Date/Time   LDLCALC 43 01/26/2019 03:09 PM   LDLDIRECT 69.0 06/15/2017 09:21 AM   . Pharmacist Clinical Goal(s): o Over the next 180 days, patient will work with PharmD and providers to maintain LDL goal < 55-70 . Current regimen:  o Rosuvastatin 10 mg once daily . Interventions: o Diet / exercise recommendations . Patient self care activities - Over the next 180 days, patient will: o Limit fried foods, focus on intake of white meats like skinless chicken, ground [place], and fish over red meats o Exercise as appropriate due to fall risk  Medication management . Pharmacist Clinical Goal(s): o Over the next 180 days, patient will work with PharmD and providers to maintain optimal medication adherence . Current pharmacy:  [facility] . Interventions o Comprehensive medication review performed. o Continue current medication management strategy . Patient self care activities - Over the next 180 days, patient will: o Take medications as prescribed o Report any questions or concerns to PharmD and/or provider(s) Initial goal documentation.      Heart Failure   Type: Systolic. Last ejection fraction: 30-35% (11/2019). BP Readings from Last 3 Encounters:  11/16/19 102/60  09/01/19 (!) 100/50  08/28/19 (!) 88/62   Pulse Readings from Last 3 Encounters:  11/16/19 73  09/01/19 80  08/28/19 75   BMP Latest Ref Rng & Units 11/29/2019 11/16/2019 07/26/2019  Glucose 70 - 99 mg/dL 99 90 98  BUN 8 - 23 mg/dL 27(H) 25(H) 25(H)  Creatinine 0.61 - 1.24 mg/dL 1.05 1.05 1.05  BUN/Creat Ratio 10 - 24 - - -  Sodium 135 - 145 mmol/L 136 136 137  Potassium 3.5 - 5.1 mmol/L 4.0 4.1 4.1  Chloride 98 - 111 mmol/L 101 102 100  CO2 22 - 32 mmol/L '25 23 26  ' Calcium 8.9 - 10.3 mg/dL 9.0 9.3 9.3   Denies dizziness. Patient is currently controlled on medications listed below. Also taking metoprolol succinate.  . Furosemide 80 mg once daily . Jardiance 10 mg once daily before breakfast . Losartan 25 mg once daily  . Spironolactone 25 mg once daily  We discussed: diet and exercise. Works out with water aerobics 3 days/wk, states stability on feet has improved with balance class 2 days a week. Trying to cut back on salt, eating at [employer] once a day. Does not drink alcohol.   Plan  Apply for patient assistance Continue current medications.  Hyperlipidemia   LDL goal < 55-70  Lipid Panel     Component Value Date/Time   CHOL 110 01/26/2019 1509   TRIG 125 01/26/2019 1509   HDL 42 01/26/2019 1509   LDLCALC 43 01/26/2019 1509   LDLDIRECT 69.0 06/15/2017 0921    Hepatic Function Latest Ref Rng & Units 09/05/2018 03/04/2018 02/17/2018  Total Protein 6.0 - 8.3 g/dL 6.7 7.1 7.0  Albumin 3.5 - 5.2 g/dL 4.2  4.0 4.1  AST 0 - 37 U/L '20 26 26  ' ALT 0 - 53 U/L '14 19 19  ' Alk Phosphatase 39 - 117 U/L 62 62 69  Total Bilirubin 0.2 - 1.2 mg/dL 1.2 1.2 1.0  Bilirubin, Direct 0.0 - 0.2  mg/dL - 0.3(H) 0.2    The ASCVD Risk score Mikey Bussing DC Jr., et al., 2013) failed to calculate for the following reasons:   The 2013 ASCVD risk score is only valid for ages 40 to 48   Patient has failed these meds in past: n/a. Denies muscle pain or side effects. Patient is currently at goal the following medications:  . Rosuvastatin 10 mg once every night at bedtime  We discussed diet and exercise extensively.   Plan  Continue current medications.  GERD   Patient reports ongoing acid reflux. Expresses understanding to avoid triggers such as alcohol, carbonated beverages and citrus juices. Taking famotidine routinely. Currently uncontrolled on: Marland Kitchen Famotidine 20 mg twice daily  Reviewed and discussed dietary triggers at length.    Plan   Continue current medication. Pt call in 2 weeks to see if reflux has improved with dietary changes such as avoiding orange juice and carbonated beverages. Next step would be to review alternative therapies covered insurance.   AFIB   Pulse Readings from Last 3 Encounters:  11/16/19 73  09/01/19 80  08/28/19 75   Previous medications: tikosyn, amiodarone.   Patient is currently rate controlled on the following medications:   Metoprolol succinate 25 mg once daily   Prevention of stroke in Afib. CHA2DS2/VAS Stroke Risk Points = 4. Denies any abnormal bruising, bleeding from nose or gums or blood in urine or stool. Patient is currently controlled on the following medications:  . Eliquis 5 mg twice daily   Plan  Continue current medications.  Vaccines   Immunization History  Administered Date(s) Administered  . Influenza Split 12/25/2010, 12/08/2011, 12/07/2012  . Influenza Whole 01/07/2007, 11/29/2007, 12/10/2008, 12/16/2009  . Influenza, High Dose Seasonal PF  12/05/2015, 12/17/2016, 12/15/2017  . Influenza,inj,Quad PF,6+ Mos 12/04/2014, 12/22/2018  . Influenza,inj,quad, With Preservative 12/07/2016  . Influenza-Unspecified 12/23/2013, 12/09/2016, 12/15/2017  . Moderna SARS-COVID-2 Vaccination 04/18/2019  . Pneumococcal Conjugate-13 02/14/2014  . Pneumococcal Polysaccharide-23 11/10/2003, 12/25/2010  . Td 11/10/2003  . Tetanus 02/14/2014  . Zoster 03/09/2008  . Zoster Recombinat (Shingrix) 12/23/2017, 04/08/2018   Reviewed and discussed patient's vaccination history. Already received flu shot. In a couple weeks is getting the covid booster.  Plan  No recommendations at this time.  Medication Management / Care Coordination   Receives prescription medications from:  [id number] [place]Phone: [phone]: [address] [address] [place]Phone: [phone]: [phone]  Denies any issues with current medication management.   Plan  Continue current medication management strategy. ___________________________ SDOH (Social Determinants of Health) assessments performed: Yes.  Future Appointments  Date Time Provider [place] 01/11/2020  9:50 AM GGatha Mayer MD [facility]  01/16/2020  1:30 PM [facility] CCM PHARMACIST [facility] PEC  02/06/2020  7:45 AM [facility] DEVICE REMOTES [facility]  02/20/2020 11:40 AM MLarey Dresser MD [facility] None  05/07/2020  7:45 AM [facility] DEVICE REMOTES [facility]  06/28/2020 10:30 AM PNarda AmberK, DO [facility] None  08/06/2020  7:45 AM [facility] DEVICE REMOTES [facility]  11/05/2020  7:45 AM [facility] DEVICE REMOTES [facility]  02/04/2021  7:45 AM [facility] DEVICE REMOTES [facility]  05/06/2021  7:45 AM [facility] DEVICE  REMOTES [facility]  08/05/2021  7:45 AM [facility] DEVICE REMOTES [facility]   Visit follow-up:  . RPH follow-up: Pt  call in 2 weeks see if reflux has improved with dietary changes such as avoiding orange juice and carbonated beverages. Next step would be to review alternative therapies covered insurance. Cost review of potential alternative plans during open enrollment.  . CPA follow-up: Jardiance patient assistance - pre-fill. Advise pt to sign and date and provide financial info if needed and to drop off with Dr. Aundra Dubin once pt section complete. CPA to send to to [facility] for review before sending to pt.   Madelin Rear, Pharm.D., BCGP Clinical Pharmacist [facility] [phone]

## 2020-01-04 ENCOUNTER — Telehealth: Payer: Self-pay

## 2020-01-04 NOTE — Patient Instructions (Addendum)
Mr [name],  Thank you for taking the time to talk with me. We can talk more about acid reflux therapies in 2 weeks and review plans that might be a better fit for the medications you are taking.  We will send you a pre-filled application for Jardiance Patient Assistance Program. Once you complete your section and provide any needed financial info, please drop with Dr. Aundra Dubin for signature and he will need to fax application to manufacturer.   Call me at [phone] with any questions!  Thank you, Ellin Mayhew, Pharm.D., BCGP Clinical Pharmacist [place] [phone]  Goals Addressed            This Visit's Progress   . PharmD Care Plan       CARE PLAN ENTRY (see longitudinal plan of care for additional care plan information)  Current Barriers:  . Chronic Disease Management support, education, and care coordination needs related to Hyperlipidemia, Atrial Fibrillation, and Heart Failure   Heart Failure / Atrial Fibrillation BP Readings from Last 3 Encounters:  11/16/19 102/60  09/01/19 (!) 100/50  08/28/19 (!) 88/62   . Pharmacist Clinical Goal(s): o Over the next 180 days, patient will work with PharmD and providers to maintain BP goal <130/80 o Over the next 180 days, patient will work with PharmD and providers to maintain HR goal 70s-80s . Current regimen:  o Metoprolol succinate (heart failure, blood pressure, rate control in atrial fibrillation) o Jardiance 10 mg once daily (heart failure) o Lasix 80 mg once daily (heart failure, blood pressure) o Losartan 25 mg once daily (heart failure, blood pressure) o Spironolactone 25 mg once daily (heart failure, blood pressure) o Eliquis 5 mg twice daily (stroke prevention in atrial fibrillation) . Interventions: o Reviewed signs of bleed such as blood in urine, black stools o Patient assistance program application for Jardiance . Patient self care activities - Over the next 180 days, patient  will: o Check BP as directed, document, and provide at future appointments o Ensure daily salt intake < 1600 mg/day o Complete pt section of Jardiance patient assistance program application and drop off at Dr Claris Gladden office  Hyperlipidemia Lab Results  Component Value Date/Time   LDLCALC 43 01/26/2019 03:09 PM   LDLDIRECT 69.0 06/15/2017 09:21 AM   . Pharmacist Clinical Goal(s): o Over the next 180 days, patient will work with PharmD and providers to maintain LDL goal < 55-70 . Current regimen:  o Rosuvastatin 10 mg once daily . Interventions: o Diet / exercise recommendations . Patient self care activities - Over the next 180 days, patient will: o Limit fried foods, focus on intake of white meats like skinless chicken, ground [place], and fish over red meats o Exercise as appropriate due to fall risk  Medication management . Pharmacist Clinical Goal(s): o Over the next 180 days, patient will work with PharmD and providers to maintain optimal medication adherence . Current pharmacy: [facility] . Interventions o Comprehensive medication review performed. o Continue current medication management strategy . Patient self care activities - Over the next 180 days, patient will: o Take medications as prescribed o Report any questions or concerns to PharmD and/or provider(s) Initial goal documentation.      The patient verbalized understanding of instructions provided today and agreed to receive a mailed copy of patient instruction and/or educational materials. Telephone follow up appointment with pharmacy team member scheduled for: See next appointment with "Care Management Staff" under "What's Next" below.   Edison Nasuti  Kenney Going, Pharm.D., BCGP Clinical Pharmacist [facility] [phone]  High Cholesterol  High cholesterol is a condition in which the blood has high levels of a white, waxy, fat-like substance (cholesterol). The human body needs small amounts of cholesterol.  The liver makes all the cholesterol that the body needs. Extra (excess) cholesterol comes from the food that we eat. Cholesterol is carried from the liver by the blood through the blood vessels. If you have high cholesterol, deposits (plaques) may build up on the walls of your blood vessels (arteries). Plaques make the arteries narrower and stiffer. Cholesterol plaques increase your risk for heart attack and stroke. Work with your health care provider to keep your cholesterol levels in a healthy range. What increases the risk? This condition is more likely to develop in people who:  Eat foods that are high in animal fat (saturated fat) or cholesterol.  Are overweight.  Are not getting enough exercise.  Have a family history of high cholesterol. What are the signs or symptoms? There are no symptoms of this condition. How is this diagnosed? This condition may be diagnosed from the results of a blood test.  If you are older than age 37, your health care provider may check your cholesterol every 4-6 years.  You may be checked more often if you already have high cholesterol or other risk factors for heart disease. The blood test for cholesterol measures:  "Bad" cholesterol (LDL cholesterol). This is the main type of cholesterol that causes heart disease. The desired level for LDL is less than 100.  "Good" cholesterol (HDL cholesterol). This type helps to protect against heart disease by cleaning the arteries and carrying the LDL away. The desired level for HDL is 60 or higher.  Triglycerides. These are fats that the body can store or burn for energy. The desired number for triglycerides is lower than 150.  Total cholesterol. This is a measure of the total amount of cholesterol in your blood, including LDL cholesterol, HDL cholesterol, and triglycerides. A healthy number is less than 200. How is this treated? This condition is treated with diet changes, lifestyle changes, and medicines. Diet  changes  This may include eating more whole grains, fruits, vegetables, nuts, and fish.  This may also include cutting back on red meat and foods that have a lot of added sugar. Lifestyle changes  Changes may include getting at least 40 minutes of aerobic exercise 3 times a week. Aerobic exercises include walking, biking, and swimming. Aerobic exercise along with a healthy diet can help you maintain a healthy weight.  Changes may also include quitting smoking. Medicines  Medicines are usually given if diet and lifestyle changes have failed to reduce your cholesterol to healthy levels.  Your health care provider may prescribe a statin medicine. Statin medicines have been shown to reduce cholesterol, which can reduce the risk of heart disease. Follow these instructions at home: Eating and drinking If told by your health care provider:  Eat chicken (without skin), fish, veal, shellfish, ground [place] breast, and round or loin cuts of red meat.  Do not eat fried foods or fatty meats, such as hot dogs and salami.  Eat plenty of fruits, such as apples.  Eat plenty of vegetables, such as broccoli, potatoes, and carrots.  Eat beans, peas, and lentils.  Eat grains such as barley, rice, couscous, and bulgur wheat.  Eat pasta without cream sauces.  Use skim or nonfat milk, and eat low-fat or nonfat yogurt and cheeses.  Do not eat or drink whole milk, cream, ice cream, egg yolks, or hard cheeses.  Do not eat stick margarine or tub margarines that contain trans fats (also called partially hydrogenated oils).  Do not eat saturated tropical oils, such as coconut oil and palm oil.  Do not eat cakes, cookies, crackers, or other baked goods that contain trans fats.  General instructions  Exercise as directed by your health care provider. Increase your activity level with activities such as gardening, walking, and taking the stairs.  Take over-the-counter and prescription medicines only  as told by your health care provider.  Do not use any products that contain nicotine or tobacco, such as cigarettes and e-cigarettes. If you need help quitting, ask your health care provider.  Keep all follow-up visits as told by your health care provider. This is important. Contact a health care provider if:  You are struggling to maintain a healthy diet or weight.  You need help to start on an exercise program.  You need help to stop smoking. Get help right away if:  You have chest pain.  You have trouble breathing. This information is not intended to replace advice given to you by your health care provider. Make sure you discuss any questions you have with your health care provider. Document Revised: 02/26/2017 Document Reviewed: 08/24/2015 Elsevier Patient Education  [place].

## 2020-01-04 NOTE — Progress Notes (Signed)
  Chronic Care Management   Outreach Note   Name: CRUZE ZINGARO MRN: [id number] DOB: 1929-12-20  Referred by: [name], MD  Left detailed message regarding update on hand pain and recommendation from Dr. Yong Channel. Informed to try cock up wrist splint for 2 weeks and if that doesn't help could set up a visit for PCP to further evaluate.   Madelin Rear, Pharm.D., BCGP Clinical Pharmacist [facility] [phone]

## 2020-01-04 NOTE — Telephone Encounter (Signed)
-----   Message from [name], MD sent at 01/03/2020  6:47 PM EDT ----- Could be carpal tunnel- could try cock up wrist splint for 2 weeks and if that helps is reassuring- if not im happy to see him and evaluate further.   Thanks, Garret Reddish ----- Message ----- From: Madelin Rear, [facility] Sent: 01/03/2020   6:21 PM EDT To: [name], MD  Hi Dr Yong Channel,  Pt mentioned new complaint of pain/stiffness and tingling sensation in hands only occurring at night, rates pain 5/10 during these times. States he is most likely fine but wanted me to ask you if this might be worth being seen for. Should I contact pt and have him schedule a visit or what might you recommend?  Thank you, Edison Nasuti

## 2020-01-04 NOTE — Telephone Encounter (Signed)
-----   Message from [name], MD sent at 01/03/2020  6:47 PM EDT ----- Could be carpal tunnel- could try cock up wrist splint for 2 weeks and if that helps is reassuring- if not im happy to see him and evaluate further.   Thanks, Garret Reddish ----- Message ----- From: Madelin Rear, [facility] Sent: 01/03/2020   6:21 PM EDT To: [name], MD  Hi Dr Yong Channel,  Pt mentioned new complaint of pain/stiffness and tingling sensation in hands only occurring at night, rates pain 5/10 during these times. States he is most likely fine but wanted me to ask you if this might be worth being seen for. Should I contact pt and have him schedule a visit or what might you recommend?  Thank you, [name]

## 2020-01-05 ENCOUNTER — Telehealth: Payer: Self-pay

## 2020-01-05 NOTE — Progress Notes (Signed)
Chronic Care Management Pharmacy Assistant   Name: RUBEN MAHLER  MRN: [id number] DOB: 11-Mar-1929  Reason for Encounter: Patient Assistance Aplication   PCP : [name], MD  Allergies:   Allergies  Allergen Reactions  . Antihistamines, Diphenhydramine-Type Other (See Comments)    Causes difficulty in ability to urinate.    Medications: Outpatient Encounter Medications as of 01/05/2020  Medication Sig  . acetaminophen (TYLENOL) 500 MG tablet Take 1,000 mg by mouth every 6 (six) hours as needed for moderate pain or headache.  Marland Kitchen apixaban (ELIQUIS) 5 MG TABS tablet Take 1 tablet (5 mg total) by mouth 2 (two) times daily.  Marland Kitchen dutasteride (AVODART) 0.5 MG capsule Take 0.5 mg by mouth daily.   . empagliflozin (JARDIANCE) 10 MG TABS tablet Take 1 tablet (10 mg total) by mouth daily before breakfast.  . famotidine (PEPCID) 20 MG tablet TAKE 1 TABLET TWICE A DAY AS NEEDED FOR HEARTBURN OR INDIGESTION  . fluticasone (FLONASE) 50 MCG/ACT nasal spray Place 2 sprays into both nostrils daily as needed for allergies or rhinitis.  . furosemide (LASIX) 80 MG tablet Take 1 tablet (80 mg total) by mouth daily. Take 80mg  (1 tablet) Daily  . hydroxypropyl methylcellulose / hypromellose (ISOPTO TEARS / GONIOVISC) 2.5 % ophthalmic solution Place 1 drop into both eyes 3 (three) times daily as needed for dry eyes.   Marland Kitchen losartan (COZAAR) 25 MG tablet Take 0.5 tablets (12.5 mg total) by mouth daily.  . metoprolol succinate (TOPROL-XL) 25 MG 24 hr tablet Take 25 mg by mouth daily.  . rosuvastatin (CRESTOR) 10 MG tablet TAKE 1 TABLET BY MOUTH EVERYDAY AT BEDTIME  . spironolactone (ALDACTONE) 25 MG tablet Take 1 tablet (25 mg total) by mouth daily.  . tamsulosin (FLOMAX) 0.4 MG CAPS capsule Take 0.4 mg by mouth 2 (two) times daily.   Marland Kitchen triamcinolone cream (KENALOG) 0.1 % Apply 1 application topically 2 (two) times daily. For 7-10 days maximum   No facility-administered encounter medications on file  as of 01/05/2020.    Current Diagnosis: Patient Active Problem List   Diagnosis Date Noted  . History of melanoma 05/01/2019  . Incomplete passage of stool 04/06/2018  . Trochanteric bursitis of right hip 09/29/2017  . History of central retinal artery occlusion 08/04/2017  . Stenosis of right internal carotid artery s/p endarterectomy 07/2017   . Thrombus of left atrial appendage without antecedent myocardial infarction 07/24/2017  . Traumatic hematoma 04/01/2017  . Left knee pain 10/23/2016  . Prolapsed internal hemorrhoids, grade 2 - causing fecal seepage 06/10/2016  . Chronic anticoagulation 02/10/2016  . AK (actinic keratosis) 01/28/2016  . Left breast lump 07/15/2015  . Lipoma 07/08/2015  . Neuropathy 05/16/2015  . Trigger thumb of right hand 07/04/2014  . Encounter for therapeutic drug monitoring 07/02/2014  . Allergic rhinitis 02/14/2014  . Fecal incontinence 10/18/2013  . Chronic cough 08/18/2013  . Pleural effusion, right 08/16/2013  . Chronic systolic CHF (congestive heart failure) ([place]) 03/21/2013  . Recurrent right inguinal hernia 11/22/2012  . Intention tremor 11/01/2012  . Pleural effusion on right 07/06/2012  . Nonischemic cardiomyopathy ([place]) 09/20/2010  . PPM-St.[place]ude 11/27/2008  . CAD (coronary artery disease) 07/20/2008  . SICK SINUS SYNDROME 07/20/2008  . History of polymyalgia rheumatica 07/03/2008  . MIXED HEARING LOSS BILATERAL 01/10/2008  . Hyperlipidemia 03/16/2007  . Atrial fibrillation ([place]) 03/16/2007  . GERD 03/16/2007  . BPH (benign prostatic hyperplasia) 03/16/2007    Spoke with patient regarding patient assistance application for  Jardiance. Informed patient application will be mailed to his home.   Informed Patient to complete his sections on the application and once completed to bring to Dr. Claris Gladden office. Patient demonstrated understanding.     Georgiana Shore ,[place] Pharmacist Assistant [phone]   Follow-Up:   Pharmacist Review

## 2020-01-11 ENCOUNTER — Encounter: Payer: Self-pay | Admitting: Internal Medicine

## 2020-01-11 ENCOUNTER — Ambulatory Visit (INDEPENDENT_AMBULATORY_CARE_PROVIDER_SITE_OTHER): Payer: Medicare Other | Admitting: Internal Medicine

## 2020-01-11 VITALS — BP 100/50 | HR 87 | Ht 65.0 in | Wt 156.4 lb

## 2020-01-11 DIAGNOSIS — I6523 Occlusion and stenosis of bilateral carotid arteries: Secondary | ICD-10-CM

## 2020-01-11 DIAGNOSIS — K8689 Other specified diseases of pancreas: Secondary | ICD-10-CM | POA: Diagnosis not present

## 2020-01-11 DIAGNOSIS — R151 Fecal smearing: Secondary | ICD-10-CM

## 2020-01-11 NOTE — Progress Notes (Signed)
[name] 86 y.o. [birth year] [id number]  Assessment & Plan:   Encounter Diagnoses  Name Primary?  . Dilated pancreatic duct-minimal and likely age appropriate Yes  . Fecal smearing     I really think this dilation of the pancreatic duct is probably age-appropriate.  I reviewed the images in the PACS and there are no lesions in the head of the pancreas.  Nothing to suggest pancreatic cancer based upon this.  He and I are both comfortable not working this up any further.  We are both glad that the fecal smearing is better and he will return as needed.  I appreciate the opportunity to care for this patient. CC: [name], MD Dr. Festus Aloe    Subjective:   Chief Complaint: Dilated pancreatic duct  HPI The patient is here at the request of Dr. Junious Silk and his team at [facility] because of a dilated pancreatic duct.  He had microscopic hematuria and had a CT of the abdomen and pelvis with and without contrast to evaluate that and the pancreatic duct was dilated to 5 mm in the head of the pancreas.  No other abnormalities no biliary duct dilation in the hepatobiliary region other than a reported lobular hepatic contour.  Incidentally the patient's wife died of metastatic pancreatic cancer last year or early this year.  He reports that his fecal smearing and bowel habits are much better he is "controlling it".  Some days he may defecate several times in the morning but not having the problems he was having before status post banding of hemorrhoids multiple times.   Wt Readings from Last 3 Encounters:  01/11/20 156 lb 6.4 oz (70.9 kg)  11/16/19 154 lb (69.9 kg)  09/01/19 156 lb 2 oz (70.8 kg)    Allergies  Allergen Reactions  . Antihistamines, Diphenhydramine-Type Other (See Comments)    Causes difficulty in ability to urinate.   Current Meds  Medication Sig  . acetaminophen (TYLENOL) 500 MG tablet Take 1,000 mg by mouth every 6 (six) hours as  needed for moderate pain or headache.  Marland Kitchen apixaban (ELIQUIS) 5 MG TABS tablet Take 1 tablet (5 mg total) by mouth 2 (two) times daily.  Marland Kitchen dutasteride (AVODART) 0.5 MG capsule Take 0.5 mg by mouth daily.   . empagliflozin (JARDIANCE) 10 MG TABS tablet Take 1 tablet (10 mg total) by mouth daily before breakfast.  . famotidine (PEPCID) 20 MG tablet Take 20 mg by mouth 2 (two) times daily.  . fluticasone (FLONASE) 50 MCG/ACT nasal spray Place 2 sprays into both nostrils daily as needed for allergies or rhinitis.  . furosemide (LASIX) 80 MG tablet Take 1 tablet (80 mg total) by mouth daily. Take 80mg  (1 tablet) Daily  . hydroxypropyl methylcellulose / hypromellose (ISOPTO TEARS / GONIOVISC) 2.5 % ophthalmic solution Place 1 drop into both eyes 3 (three) times daily as needed for dry eyes.   Marland Kitchen losartan (COZAAR) 25 MG tablet Take 0.5 tablets (12.5 mg total) by mouth daily.  . metoprolol succinate (TOPROL-XL) 25 MG 24 hr tablet Take 25 mg by mouth daily.  . rosuvastatin (CRESTOR) 10 MG tablet TAKE 1 TABLET BY MOUTH EVERYDAY AT BEDTIME  . spironolactone (ALDACTONE) 25 MG tablet Take 1 tablet (25 mg total) by mouth daily.  . tamsulosin (FLOMAX) 0.4 MG CAPS capsule Take 0.4 mg by mouth 2 (two) times daily.   Marland Kitchen triamcinolone cream (KENALOG) 0.1 % Apply 1 application topically as needed.   Past Medical History:  Diagnosis Date  . Arthritis    "back" (03/20/2014)  . Atrial fibrillation ([place])   . BPH (benign prostatic hypertrophy)   . Breast mass    "on both sides" (06/28/2012)  . Cardiomyopathy primary-nonischemic EF 45%  . Carotid artery occlusion   . CHF (congestive heart failure) ([place])   . Coronary artery disease   . Diverticulosis of colon without hemorrhage 01/03/2014  . Dysphagia   . Elevated liver enzymes   . Esophageal stricture   . GERD (gastroesophageal reflux disease)   . Hearing loss, mixed, bilateral   . Hiatal hernia   . Hx of cardiovascular stress test    Adenosine Myoview (07/2013):   Apical cap, apical lateral and mid anterolateral scar, small amount of peri-infarct ischemia, EF 36%; Medium Risk  . Hyperlipidemia   . Increased prostate specific antigen (PSA) velocity   . Internal hemorrhoids 01/03/2014  . Lumbar back pain   . Pacemaker    st jude  . Pacemaker infection ([place])    06/28/12  . Polymyalgia rheumatica ([place])   . Sick sinus syndrome [facility])    Past Surgical History:  Procedure Laterality Date  . AV NODE ABLATION N/A 04/20/2016   Procedure: AV Node Ablation;  Surgeon: Evans Lance, MD;  Location: [facility];  Service: Cardiovascular;  Laterality: N/A;  . BASAL CELL CARCINOMA EXCISION  2021   ear  . CARDIAC CATHETERIZATION  1980's   [place]  . CARDIOVERSION  06/17/2011   Procedure: CARDIOVERSION;  Surgeon: Lelon Perla, MD;  Location: [place];  Service: Cardiovascular;  Laterality: N/A;  . CARDIOVERSION  09/09/2011   Procedure: CARDIOVERSION;  Surgeon: Carlena Bjornstad, MD;  Location: [place];  Service: Cardiovascular;  Laterality: N/A;  . CARDIOVERSION  01/01/2012   Procedure: CARDIOVERSION;  Surgeon: Hillary Bow, MD;  Location[facility] ENDOSCOPY;  Service: Cardiovascular;  Laterality: N/A;  . CARDIOVERSION N/A 08/03/2014   Procedure: CARDIOVERSION;  Surgeon: Larey Dresser, MD;  Location: [facility] ENDOSCOPY;  Service: Cardiovascular;  Laterality: N/A;  . CARDIOVERSION N/A 11/07/2015   Procedure: CARDIOVERSION;  Surgeon: Larey Dresser, MD;  Location: [facility] ENDOSCOPY;  Service: Cardiovascular;  Laterality: N/A;  . CARDIOVERSION N/A 11/20/2015   Procedure: CARDIOVERSION;  Surgeon: Larey Dresser, MD;  Location: [place];  Service: Cardiovascular;  Laterality: N/A;  . CAROTID ENDARTERECTOMY    . CATARACT EXTRACTION, BILATERAL Bilateral 1980's  . COLONOSCOPY N/A 01/03/2014   Procedure: COLONOSCOPY;  Surgeon: Gatha Mayer, MD;  Location: [facility] ENDOSCOPY;  Service: Endoscopy;  Laterality: N/A;  . ENDARTERECTOMY Right 07/26/2017   Procedure: RIGHT CAROTID  ARTERY  ENDARTERECTOMY;  Surgeon: Rosetta Posner, MD;  Location[facility] OR;  Service: Vascular;  Laterality: Right;  . ESOPHAGOGASTRODUODENOSCOPY    . GENERATOR REMOVAL Left 06/15/2012   Procedure: GENERATOR REMOVAL;  Surgeon: Evans Lance, MD;  Location: [place];  Service: Cardiovascular;  Laterality: Left;  . HEMORRHOID BANDING    . INGUINAL HERNIA REPAIR Right 2012; 03/20/2014  . INGUINAL HERNIA REPAIR Right 03/20/2014   Procedure: OPEN REPAIR OF RIGHT INGUINAL WITH MESH;  Surgeon: Donnie Mesa, MD;  Location: [place];  Service: General;  Laterality: Right;  . INSERT / REPLACE / REMOVE PACEMAKER    . INSERTION OF MESH Right 03/20/2014   Procedure: INSERTION OF MESH;  Surgeon: Donnie Mesa, MD;  Location: [place];  Service: General;  Laterality: Right;  . KNEE ARTHROSCOPY WITH LATERAL MENISECTOMY Left 12/16/2016   Procedure: KNEE ARTHROSCOPY WITH LATERAL MENISECTOMY and  Chondroplasty;  Surgeon: Earlie Server, MD;  Location: [place];  Service: Orthopedics;  Laterality: Left;  . LEAD REVISION N/A 06/29/2012   Procedure: LEAD REVISION;  Surgeon: Deboraha Sprang, MD;  Location: [facility] CATH LAB;  Service: Cardiovascular;  Laterality: N/A;  . MELANOMA EXCISION Right 2020   arm  . PACEMAKER GENERATOR CHANGE N/A 06/15/2012   Procedure: PACEMAKER GENERATOR CHANGE;  Surgeon: Evans Lance, MD;  Location: [place];  Service: Cardiovascular;  Laterality: N/A;  . PACEMAKER REVISION  06/30/2012   Procedure: PACEMAKER LEAD REVISION;  Surgeon: Evans Lance, MD;  Location: [facility] CATH LAB;  Service: Cardiovascular;;  . PARACENTESIS  ~ 05/2669   complication from pacer change in 2014  . PATCH ANGIOPLASTY Right 07/26/2017   Procedure: WITH PATCH ANGIOPLASTY;  Surgeon: Rosetta Posner, MD;  Location: [place];  Service: Vascular;  Laterality: Right;  . PERMANENT PACEMAKER INSERTION N/A 06/28/2012   Procedure: PERMANENT PACEMAKER INSERTION;  Surgeon: Evans Lance, MD;  Location: [facility] CATH LAB;  Service: Cardiovascular;  Laterality: N/A;  . TEE  WITHOUT CARDIOVERSION  01/01/2012   Procedure: TRANSESOPHAGEAL ECHOCARDIOGRAM (TEE);  Surgeon: Fay Records, MD;  Location[facility] ENDOSCOPY;  Service: Cardiovascular;  Laterality: N/A;  . TEE WITHOUT CARDIOVERSION N/A 08/03/2014   Procedure: TRANSESOPHAGEAL ECHOCARDIOGRAM (TEE);  Surgeon: Larey Dresser, MD;  Location: [place];  Service: Cardiovascular;  Laterality: N/A;  . TEE WITHOUT CARDIOVERSION N/A 11/07/2015   Procedure: TRANSESOPHAGEAL ECHOCARDIOGRAM (TEE);  Surgeon: Larey Dresser, MD;  Location: [place];  Service: Cardiovascular;  Laterality: N/A;  . TONSILLECTOMY AND ADENOIDECTOMY  1938  . UMBILICAL HERNIA REPAIR  [phone] X 3   Social History   Social History Narrative   Married. 3 children (6 together). 5 grandkids with with 5 grandkids (2nd marriage). No greatgrandkids.    One story home. Lives in [place]    Retired from [employer]   Education: college   Hobbies: play golf, bridge, garden, write family stories   family history includes AAA (abdominal aortic aneurysm) in his mother; Diabetes in his cousin; Healthy in his daughter and son; Heart attack (age of onset: 56) in his father; Hypertension in his mother; Other in his brother; Tuberculosis in his mother.   Review of Systems  As per HPI Objective:   Physical Exam BP (!) 100/50   Pulse 87   Ht 5\' 5"  (1.651 m)   Wt 156 lb 6.4 oz (70.9 kg)   SpO2 98%   BMI 26.03 kg/m   Data reviewed includes CT abdomen pelvis with and without contrast 10/25/2019-report and images [facility] as well

## 2020-01-11 NOTE — Patient Instructions (Signed)
°  Glad to see you today , Dr Carlean Purl said to follow up with [place] as needed.   I appreciate the opportunity to care for you. Silvano Rusk, MD, [facility]

## 2020-01-16 ENCOUNTER — Telehealth: Payer: Medicare Other

## 2020-01-17 ENCOUNTER — Telehealth: Payer: Self-pay

## 2020-01-17 DIAGNOSIS — D0321 Melanoma in situ of right ear and external auricular canal: Secondary | ICD-10-CM | POA: Diagnosis not present

## 2020-01-17 DIAGNOSIS — L989 Disorder of the skin and subcutaneous tissue, unspecified: Secondary | ICD-10-CM | POA: Diagnosis not present

## 2020-01-17 NOTE — Telephone Encounter (Signed)
Patient called in and wanted to have a DNR filled out by Dr. Yong Channel. Dr. Yong Channel has filled it out. Patient was notified it was ready for pickup.

## 2020-01-22 NOTE — Progress Notes (Unsigned)
Patient assistance application for Jardiance reprinted and sent to pt for completion on 11/15.

## 2020-01-23 ENCOUNTER — Ambulatory Visit: Payer: Medicare Other

## 2020-01-23 DIAGNOSIS — Z23 Encounter for immunization: Secondary | ICD-10-CM | POA: Diagnosis not present

## 2020-01-23 DIAGNOSIS — I5022 Chronic systolic (congestive) heart failure: Secondary | ICD-10-CM

## 2020-01-23 NOTE — Chronic Care Management (AMB) (Signed)
  Chronic Care Management   Outreach Note   Name: [name] MRN: [id number] DOB: 04/12/1929  Referred by: [name], MD Reason for referral: Telephone Appointment with [facility] Pharmacist, Madelin Rear.   An unsuccessful telephone outreach was attempted today. The patient was referred to the pharmacist for assistance with care management and care coordination.   Telephone appointment with clinical pharmacist today (01/23/2020) at 130pm. Pt requested reschedule. Madelin Rear, Pharm.D., BCGP Clinical Pharmacist [facility] [phone]

## 2020-01-23 NOTE — Progress Notes (Signed)
Chronic Care Management Pharmacy  Name: [name]  MRN: [id number]  DOB: 1929/11/14  Chief Complaint/ HPI [name], 86 y.o., male, presents for their follow-up CCM visit with the clinical pharmacist via telephone due to COVID-19 pandemic .  PCP : [name], MD Encounter Diagnosis  Name Primary?  . Chronic systolic CHF (congestive heart failure) ([place]) Yes     Patient Active Problem List   Diagnosis Date Noted  . Dilated pancreatic duct-minimal and likely age appropriate 01/11/2020  . History of melanoma 05/01/2019  . Incomplete passage of stool 04/06/2018  . Trochanteric bursitis of right hip 09/29/2017  . History of central retinal artery occlusion 08/04/2017  . Stenosis of right internal carotid artery s/p endarterectomy 07/2017   . Thrombus of left atrial appendage without antecedent myocardial infarction 07/24/2017  . Traumatic hematoma 04/01/2017  . Left knee pain 10/23/2016  . Prolapsed internal hemorrhoids, grade 2 - causing fecal seepage 06/10/2016  . Chronic anticoagulation 02/10/2016  . AK (actinic keratosis) 01/28/2016  . Left breast lump 07/15/2015  . Lipoma 07/08/2015  . Neuropathy 05/16/2015  . Trigger thumb of right hand 07/04/2014  . Encounter for therapeutic drug monitoring 07/02/2014  . Allergic rhinitis 02/14/2014  . Fecal incontinence 10/18/2013  . Chronic cough 08/18/2013  . Pleural effusion, right 08/16/2013  . Chronic systolic CHF (congestive heart failure) ([place]) 03/21/2013  . Recurrent right inguinal hernia 11/22/2012  . Intention tremor 11/01/2012  . Pleural effusion on right 07/06/2012  . Nonischemic cardiomyopathy ([place]) 09/20/2010  . PPM-[place] 11/27/2008  . CAD (coronary artery disease) 07/20/2008  . SICK SINUS SYNDROME 07/20/2008  . History of polymyalgia rheumatica 07/03/2008  . MIXED HEARING LOSS BILATERAL 01/10/2008  . Hyperlipidemia 03/16/2007  . Atrial fibrillation ([place]) 03/16/2007  . GERD 03/16/2007  . BPH  (benign prostatic hyperplasia) 03/16/2007   Past Surgical History:  Procedure Laterality Date  . AV NODE ABLATION N/A 04/20/2016   Procedure: AV Node Ablation;  Surgeon: Evans Lance, MD;  Location: [facility];  Service: Cardiovascular;  Laterality: N/A;  . BASAL CELL CARCINOMA EXCISION  2021   ear  . CARDIAC CATHETERIZATION  1980's   [place]  . CARDIOVERSION  06/17/2011   Procedure: CARDIOVERSION;  Surgeon: Lelon Perla, MD;  Location: [place];  Service: Cardiovascular;  Laterality: N/A;  . CARDIOVERSION  09/09/2011   Procedure: CARDIOVERSION;  Surgeon: Carlena Bjornstad, MD;  Location: [place];  Service: Cardiovascular;  Laterality: N/A;  . CARDIOVERSION  01/01/2012   Procedure: CARDIOVERSION;  Surgeon: Hillary Bow, MD;  Location: [facility] ENDOSCOPY;  Service: Cardiovascular;  Laterality: N/A;  . CARDIOVERSION N/A 08/03/2014   Procedure: CARDIOVERSION;  Surgeon: Larey Dresser, MD;  Location: [facility] ENDOSCOPY;  Service: Cardiovascular;  Laterality: N/A;  . CARDIOVERSION N/A 11/07/2015   Procedure: CARDIOVERSION;  Surgeon: Larey Dresser, MD;  Location: [facility] ENDOSCOPY;  Service: Cardiovascular;  Laterality: N/A;  . CARDIOVERSION N/A 11/20/2015   Procedure: CARDIOVERSION;  Surgeon: Larey Dresser, MD;  Location: [place];  Service: Cardiovascular;  Laterality: N/A;  . CAROTID ENDARTERECTOMY    . CATARACT EXTRACTION, BILATERAL Bilateral 1980's  . COLONOSCOPY N/A 01/03/2014   Procedure: COLONOSCOPY;  Surgeon: Gatha Mayer, MD;  Location: [facility] ENDOSCOPY;  Service: Endoscopy;  Laterality: N/A;  . ENDARTERECTOMY Right 07/26/2017   Procedure: RIGHT CAROTID  ARTERY ENDARTERECTOMY;  Surgeon: Rosetta Posner, MD;  Location: [facility] OR;  Service: Vascular;  Laterality: Right;  . ESOPHAGOGASTRODUODENOSCOPY    . GENERATOR REMOVAL  Left 06/15/2012   Procedure: GENERATOR REMOVAL;  Surgeon: Evans Lance, MD;  Location: [place];  Service: Cardiovascular;  Laterality: Left;  . HEMORRHOID BANDING    . INGUINAL  HERNIA REPAIR Right 2012; 03/20/2014  . INGUINAL HERNIA REPAIR Right 03/20/2014   Procedure: OPEN REPAIR OF RIGHT INGUINAL WITH MESH;  Surgeon: Donnie Mesa, MD;  Location: [place];  Service: General;  Laterality: Right;  . INSERT / REPLACE / REMOVE PACEMAKER    . INSERTION OF MESH Right 03/20/2014   Procedure: INSERTION OF MESH;  Surgeon: Donnie Mesa, MD;  Location: [place];  Service: General;  Laterality: Right;  . KNEE ARTHROSCOPY WITH LATERAL MENISECTOMY Left 12/16/2016   Procedure: KNEE ARTHROSCOPY WITH LATERAL MENISECTOMY and Chondroplasty;  Surgeon: Earlie Server, MD;  Location: [place];  Service: Orthopedics;  Laterality: Left;  . LEAD REVISION N/A 06/29/2012   Procedure: LEAD REVISION;  Surgeon: Deboraha Sprang, MD;  Location: [facility] CATH LAB;  Service: Cardiovascular;  Laterality: N/A;  . MELANOMA EXCISION Right 2020   arm  . PACEMAKER GENERATOR CHANGE N/A 06/15/2012   Procedure: PACEMAKER GENERATOR CHANGE;  Surgeon: Evans Lance, MD;  Location: [place];  Service: Cardiovascular;  Laterality: N/A;  . PACEMAKER REVISION  06/30/2012   Procedure: PACEMAKER LEAD REVISION;  Surgeon: Evans Lance, MD;  Location: [facility] C[facility] LAB;  Service: Cardiovascular;;  . PARACENTESIS  ~ 09/3708   complication from pacer change in 2014  . PATCH ANGIOPLASTY Right 07/26/2017   Procedure: WITH PATCH ANGIOPLASTY;  Surgeon: Rosetta Posner, MD;  Location: [place];  Service: Vascular;  Laterality: Right;  . PERMANENT PACEMAKER INSERTION N/A 06/28/2012   Procedure: PERMANENT PACEMAKER INSERTION;  Surgeon: Evans Lance, MD;  Location: [facility] CATH LAB;  Service: Cardiovascular;  Laterality: N/A;  . TEE WITHOUT CARDIOVERSION  01/01/2012   Procedure: TRANSESOPHAGEAL ECHOCARDIOGRAM (TEE);  Surgeon: Fay Records, MD;  Location: [facility] ENDOSCOPY;  Service: Cardiovascular;  Laterality: N/A;  . TEE WITHOUT CARDIOVERSION N/A 08/03/2014   Procedure: TRANSESOPHAGEAL ECHOCARDIOGRAM (TEE);  Surgeon: Larey Dresser, MD;  Location: [place];   Service: Cardiovascular;  Laterality: N/A;  . TEE WITHOUT CARDIOVERSION N/A 11/07/2015   Procedure: TRANSESOPHAGEAL ECHOCARDIOGRAM (TEE);  Surgeon: Larey Dresser, MD;  Location: [place];  Service: Cardiovascular;  Laterality: N/A;  . TONSILLECTOMY AND ADENOIDECTOMY  1938  . UMBILICAL HERNIA REPAIR  [phone] X 3   Family History  Problem Relation Age of Onset  . Heart attack Father 37       deceased  . AAA (abdominal aortic aneurysm) Mother        deceased AAA  . Hypertension Mother   . Tuberculosis Mother   . Other Brother        deceased at birth  . Healthy Son   . Healthy Daughter   . Diabetes Cousin   . Esophageal cancer Neg Hx   . Colon cancer Neg Hx    Allergies  Allergen Reactions  . Antihistamines, Diphenhydramine-Type Other (See Comments)    Causes difficulty in ability to urinate.   Outpatient Encounter Medications as of 01/23/2020  Medication Sig  . acetaminophen (TYLENOL) 500 MG tablet Take 1,000 mg by mouth every 6 (six) hours as needed for moderate pain or headache.  Marland Kitchen apixaban (ELIQUIS) 5 MG TABS tablet Take 1 tablet (5 mg total) by mouth 2 (two) times daily.  Marland Kitchen dutasteride (AVODART) 0.5 MG capsule Take 0.5 mg by mouth daily.   . empagliflozin (JARDIANCE) 10 MG TABS tablet  Take 1 tablet (10 mg total) by mouth daily before breakfast.  . famotidine (PEPCID) 20 MG tablet Take 20 mg by mouth 2 (two) times daily.  . fluticasone (FLONASE) 50 MCG/ACT nasal spray Place 2 sprays into both nostrils daily as needed for allergies or rhinitis.  . furosemide (LASIX) 80 MG tablet Take 1 tablet (80 mg total) by mouth daily. Take 80mg  (1 tablet) Daily  . hydroxypropyl methylcellulose / hypromellose (ISOPTO TEARS / GONIOVISC) 2.5 % ophthalmic solution Place 1 drop into both eyes 3 (three) times daily as needed for dry eyes.   Marland Kitchen losartan (COZAAR) 25 MG tablet Take 0.5 tablets (12.5 mg total) by mouth daily.  . metoprolol succinate (TOPROL-XL) 25 MG 24 hr tablet Take 25 mg by  mouth daily.  . rosuvastatin (CRESTOR) 10 MG tablet TAKE 1 TABLET BY MOUTH EVERYDAY AT BEDTIME  . spironolactone (ALDACTONE) 25 MG tablet Take 1 tablet (25 mg total) by mouth daily.  . tamsulosin (FLOMAX) 0.4 MG CAPS capsule Take 0.4 mg by mouth 2 (two) times daily.   Marland Kitchen triamcinolone cream (KENALOG) 0.1 % Apply 1 application topically as needed.   No facility-administered encounter medications on file as of 01/23/2020.   Patient Care Team    Relationship Specialty Notifications Start End  [name], MD PCP - General Family Medicine  02/14/14    Comment: Scarlette Ar, MD Consulting Physician Cardiothoracic Surgery  08/27/12   Larey Dresser, MD Consulting Physician Cardiology  08/27/12   Collene Gobble, MD Consulting Physician Pulmonary Disease  08/27/12   Evans Lance, MD Consulting Physician Cardiology  08/27/12   Alda Berthold, DO Consulting Physician Neurology  06/06/19   Gatha Mayer, MD Consulting Physician Gastroenterology  06/06/19   Wallene Huh, DPM Consulting Physician Podiatry  06/06/19   Luberta Mutter, MD Consulting Physician Ophthalmology  06/06/19   Madelin Rear, [facility] Pharmacist Pharmacist  10/18/19    Comment: [phone]   Current Diagnosis/Assessment: Goals Addressed            This Visit's Progress   . PharmD Care Plan   On track    CARE PLAN ENTRY (see longitudinal plan of care for additional care plan information)  Current Barriers:  . Chronic Disease Management support, education, and care coordination needs related to Hyperlipidemia, Atrial Fibrillation, and Heart Failure   Heart Failure / Atrial Fibrillation BP Readings from Last 3 Encounters:  11/16/19 102/60  09/01/19 (!) 100/50  08/28/19 (!) 88/62   . Pharmacist Clinical Goal(s): o Over the next 180 days, patient will work with PharmD and providers to maintain BP goal <130/80 o Over the next 180 days, patient will work with PharmD and providers to maintain HR goal  70s-80s . Current regimen:  o Metoprolol succinate (heart failure, blood pressure, rate control in atrial fibrillation) o Jardiance 10 mg once daily (heart failure) o Lasix 80 mg once daily (heart failure, blood pressure) o Losartan 25 mg once daily (heart failure, blood pressure) o Spironolactone 25 mg once daily (heart failure, blood pressure) o Eliquis 5 mg twice daily (stroke prevention in atrial fibrillation) . Interventions: o Reviewed signs of bleed such as blood in urine, black stools o Patient assistance program application for Jardiance . Patient self care activities - Over the next 180 days, patient will: o Check BP as directed, document, and provide at future appointments o Ensure daily salt intake < 1600 mg/day o Complete pt section of Jardiance patient assistance program application  and drop off at Dr Claris Gladden office  Hyperlipidemia Lab Results  Component Value Date/Time   LDLCALC 43 01/26/2019 03:09 PM   LDLDIRECT 69.0 06/15/2017 09:21 AM   . Pharmacist Clinical Goal(s): o Over the next 180 days, patient will work with PharmD and providers to maintain LDL goal < 55-70 . Current regimen:  o Rosuvastatin 10 mg once daily . Interventions: o Diet / exercise recommendations . Patient self care activities - Over the next 180 days, patient will: o Limit fried foods, focus on intake of white meats like skinless chicken, ground [place], and fish over red meats o Exercise as appropriate due to fall risk  Medication management . Pharmacist Clinical Goal(s): o Over the next 180 days, patient will work with PharmD and providers to maintain optimal medication adherence . Current pharmacy[facility] . Interventions o Comprehensive medication review performed. o Continue current medication management strategy . Patient self care activities - Over the next 180 days, patient will: o Take medications as prescribed o Report any questions or concerns to PharmD and/or  provider(s) Initial goal documentation.      Heart Failure   Type: Systolic. Last ejection fraction: 30-35% (11/2019).  BP Readings from Last 3 Encounters:  01/11/20 (!) 100/50  11/16/19 102/60  09/01/19 (!) 100/50   Pulse Readings from Last 3 Encounters:  01/11/20 87  11/16/19 73  09/01/19 80   BMP Latest Ref Rng & Units 11/29/2019 11/16/2019 07/26/2019  Glucose 70 - 99 mg/dL 99 90 98  BUN 8 - 23 mg/dL 27(H) 25(H) 25(H)  Creatinine 0.61 - 1.24 mg/dL 1.05 1.05 1.05  BUN/Creat Ratio 10 - 24 - - -  Sodium 135 - 145 mmol/L 136 136 137  Potassium 3.5 - 5.1 mmol/L 4.0 4.1 4.1  Chloride 98 - 111 mmol/L 101 102 100  CO2 22 - 32 mmol/L 25 23 26   Calcium 8.9 - 10.3 mg/dL 9.0 9.3 9.3   Reports losing ~5 lbs over last two months. Feels that he is well hydrated. Denies dizziness. Patient is currently controlled on medications listed below. Also taking metoprolol succinate.  . Furosemide 80 mg once daily . Jardiance 10 mg once daily before breakfast . Losartan 25 mg once daily  . Spironolactone 25 mg once daily  We reviewed: diet and exercise. Works out with water aerobics 3 days/wk, states stability on feet has improved with balance class 2 days a week. Trying to cut back on salt, eating at [employer] once a day. Does not drink alcohol.   Plan  Pt to complete pt section jardiance PAP application and bring to f/u visit with Dr. Aundra Dubin. Continue current medications.  GERD   Reports reflux has improved with dietary changes and below medication over last couple of week.s  Currently controlled on: Marland Kitchen Famotidine 20 mg twice daily  Reviewed and discussed dietary triggers at length.    Plan   Continue current medication.   Medication Management / Care Coordination   Receives prescription medications from:  [id number] - [place], [place] [address] Phone: [phone] Fax: [place], [place] [address] Phone: [phone] Fax: [phone]   Denies any issues with current medication management.  PAP application for Jardiance resent to pt 01/22/2020 - address has been confirmed. [employer] information and cost review of part d plans for 2022 sent to pt.  Plan  Continue current medication management strategy. ___________________________ SDOH (Social Determinants of Health) assessments performed: Yes.  Future Appointments  Date Time Provider [place]  02/06/2020  7:45 AM [facility] DEVICE REMOTES [facility]  02/20/2020 11:40 AM Larey Dresser, MD [facility] None  05/07/2020  7:45 AM [facility] DEVICE REMOTES [facility]  06/28/2020 10:30 AM Narda Amber K, DO [facility] None  08/06/2020  7:45 AM [facility] DEVICE REMOTES [facility]  09/24/2020  1:30 PM [facility] CCM PHARMACIST [facility]  11/05/2020  7:45 AM [facility] DEVICE REMOTES [facility]  02/04/2021  7:45 AM [facility] DEVICE REMOTES [facility]  05/06/2021  7:45 AM [facility] DEVICE REMOTES [facility]  08/05/2021  7:45 AM [facility] DEVICE REMOTES [facility]   Visit follow-up:  . [place] follow-up: 8 month f/u call, PAP  Madelin Rear, Pharm.D., BCGP Clinical Pharmacist [place] [phone]

## 2020-01-23 NOTE — Patient Instructions (Addendum)
[name],  I am hoping that you have received your patient assistance application for Jardiance - if this is not the case please call me [phone].  I have included cost comparisons for 2022 Part D Plans - two separate versions, one with Jardiance and Eliquis, the other without. [employer] is a great resource if you are wanting to ask them about plan selection as well. You will find their phone number on the handout I have included.  Thank you, Edyth Gunnels., Clinical Pharmacist  Goals Addressed            This Visit's Progress   . PharmD Care Plan   On track    CARE PLAN ENTRY (see longitudinal plan of care for additional care plan information)  Current Barriers:  . Chronic Disease Management support, education, and care coordination needs related to Hyperlipidemia, Atrial Fibrillation, and Heart Failure   Heart Failure / Atrial Fibrillation BP Readings from Last 3 Encounters:  11/16/19 102/60  09/01/19 (!) 100/50  08/28/19 (!) 88/62   . Pharmacist Clinical Goal(s): o Over the next 180 days, patient will work with PharmD and providers to maintain BP goal <130/80 o Over the next 180 days, patient will work with PharmD and providers to maintain HR goal 70s-80s . Current regimen:  o Metoprolol succinate (heart failure, blood pressure, rate control in atrial fibrillation) o Jardiance 10 mg once daily (heart failure) o Lasix 80 mg once daily (heart failure, blood pressure) o Losartan 25 mg once daily (heart failure, blood pressure) o Spironolactone 25 mg once daily (heart failure, blood pressure) o Eliquis 5 mg twice daily (stroke prevention in atrial fibrillation) . Interventions: o Reviewed signs of bleed such as blood in urine, black stools o Patient assistance program application for Jardiance . Patient self care activities - Over the next 180 days, patient will: o Check BP as directed, document, and provide at future appointments o Ensure daily salt intake < 1600  mg/day o Complete pt section of Jardiance patient assistance program application and drop off at Dr Claris Gladden office  Hyperlipidemia Lab Results  Component Value Date/Time   LDLCALC 43 01/26/2019 03:09 PM   LDLDIRECT 69.0 06/15/2017 09:21 AM   . Pharmacist Clinical Goal(s): o Over the next 180 days, patient will work with PharmD and providers to maintain LDL goal < 55-70 . Current regimen:  o Rosuvastatin 10 mg once daily . Interventions: o Diet / exercise recommendations . Patient self care activities - Over the next 180 days, patient will: o Limit fried foods, focus on intake of white meats like skinless chicken, ground [place], and fish over red meats o Exercise as appropriate due to fall risk  Medication management . Pharmacist Clinical Goal(s): o Over the next 180 days, patient will work with PharmD and providers to maintain optimal medication adherence . Current pharmacy: [facility] . Interventions o Comprehensive medication review performed. o Continue current medication management strategy . Patient self care activities - Over the next 180 days, patient will: o Take medications as prescribed o Report any questions or concerns to PharmD and/or provider(s) Initial goal documentation.      The patient verbalized understanding of instructions provided today and agreed to receive a mailed copy of patient instruction and/or educational materials. Telephone follow up appointment with pharmacy team member scheduled for: See next appointment with "Care Management Staff" under "What's Next" below.   Madelin Rear, Pharm.D., BCGP Clinical Pharmacist [place] [facility] [phone]

## 2020-01-29 ENCOUNTER — Telehealth: Payer: Self-pay

## 2020-01-29 NOTE — Telephone Encounter (Signed)
Patient dropped off forms, have placed them up front in basket.

## 2020-01-29 NOTE — Telephone Encounter (Signed)
Noted  

## 2020-02-06 ENCOUNTER — Ambulatory Visit (INDEPENDENT_AMBULATORY_CARE_PROVIDER_SITE_OTHER): Payer: Medicare Other

## 2020-02-06 DIAGNOSIS — I495 Sick sinus syndrome: Secondary | ICD-10-CM | POA: Diagnosis not present

## 2020-02-07 LAB — CUP PACEART REMOTE DEVICE CHECK
Battery Remaining Longevity: 20 mo
Battery Remaining Percentage: 22 %
Battery Voltage: 2.78 V
Date Time Interrogation Session: 20211130093837
Implantable Lead Implant Date: 20140422
Implantable Lead Implant Date: 20140424
Implantable Lead Implant Date: 20140424
Implantable Lead Location: 753858
Implantable Lead Location: 753859
Implantable Lead Location: 753860
Implantable Lead Model: 5076
Implantable Lead Model: 5076
Implantable Pulse Generator Implant Date: 20140422
Lead Channel Impedance Value: 1050 Ohm
Lead Channel Impedance Value: 430 Ohm
Lead Channel Pacing Threshold Amplitude: 1.25 V
Lead Channel Pacing Threshold Amplitude: 1.25 V
Lead Channel Pacing Threshold Pulse Width: 0.5 ms
Lead Channel Pacing Threshold Pulse Width: 0.5 ms
Lead Channel Sensing Intrinsic Amplitude: 9.4 mV
Lead Channel Setting Pacing Amplitude: 2.5 V
Lead Channel Setting Pacing Amplitude: 2.5 V
Lead Channel Setting Pacing Pulse Width: 0.5 ms
Lead Channel Setting Pacing Pulse Width: 0.5 ms
Lead Channel Setting Sensing Sensitivity: 4 mV
Pulse Gen Model: 3210
Pulse Gen Serial Number: 2926516

## 2020-02-08 DIAGNOSIS — D0321 Melanoma in situ of right ear and external auricular canal: Secondary | ICD-10-CM | POA: Diagnosis not present

## 2020-02-12 NOTE — Progress Notes (Signed)
Remote pacemaker transmission.   

## 2020-02-14 ENCOUNTER — Other Ambulatory Visit: Payer: Self-pay

## 2020-02-14 ENCOUNTER — Ambulatory Visit (INDEPENDENT_AMBULATORY_CARE_PROVIDER_SITE_OTHER): Payer: Medicare Other | Admitting: Family Medicine

## 2020-02-14 ENCOUNTER — Ambulatory Visit (INDEPENDENT_AMBULATORY_CARE_PROVIDER_SITE_OTHER)
Admission: RE | Admit: 2020-02-14 | Discharge: 2020-02-14 | Disposition: A | Discharge status: Home / Self Care | Payer: Medicare Other | Source: Ambulatory Visit | Attending: Family Medicine | Admitting: Family Medicine

## 2020-02-14 ENCOUNTER — Encounter: Payer: Self-pay | Admitting: Family Medicine

## 2020-02-14 VITALS — BP 92/57 | HR 71 | Temp 97.2°F | Ht 65.0 in | Wt 154.4 lb

## 2020-02-14 DIAGNOSIS — R5383 Other fatigue: Secondary | ICD-10-CM

## 2020-02-14 DIAGNOSIS — E785 Hyperlipidemia, unspecified: Secondary | ICD-10-CM

## 2020-02-14 DIAGNOSIS — J90 Pleural effusion, not elsewhere classified: Secondary | ICD-10-CM | POA: Diagnosis not present

## 2020-02-14 DIAGNOSIS — I6523 Occlusion and stenosis of bilateral carotid arteries: Secondary | ICD-10-CM | POA: Diagnosis not present

## 2020-02-14 DIAGNOSIS — R0600 Dyspnea, unspecified: Secondary | ICD-10-CM | POA: Diagnosis not present

## 2020-02-14 DIAGNOSIS — R0602 Shortness of breath: Secondary | ICD-10-CM | POA: Diagnosis not present

## 2020-02-14 DIAGNOSIS — I517 Cardiomegaly: Secondary | ICD-10-CM | POA: Diagnosis not present

## 2020-02-14 NOTE — Patient Instructions (Addendum)
Please stop by lab before you go If you have mychart- we will send your results within 3 business days of [place] receiving them.  If you do not have mychart- we will call you about results within 5 business days of [place] receiving them.  *please note we are currently using [facility] which has a longer processing time than [place] typically so labs may not come back as quickly as in the past *please also note that you will see labs on mychart as soon as they post. I will later go in and write notes on them- will say "notes from Dr. Yong Channel"   Please go to [facility]  central X-ray (updated 05/04/2019) - located [address] across the street from [place] - in the basement - Hours: 8:30-5:00 PM M-F (with lunch from 12:30- 1 PM). You do NOT need an appointment.  - Please ensure you are covid symptom free before going in(No fever, chills, cough, congestion, runny nose, shortness of breath, fatigue, body aches, sore throat, headache, nausea, vomiting, diarrhea, or new loss of taste or smell. No known contacts with covid 19 or someone being tested for covid 19)  Recommended follow up: Return in about 3 months (around 05/14/2020) for follow up- or sooner if needed.

## 2020-02-14 NOTE — Progress Notes (Signed)
Phone [phone] In person visit   Subjective:   [name] is a 86 y.o. year old very pleasant male patient who presents for/with See problem oriented charting Chief Complaint  Patient presents with  . Follow-up    pt hit the L side of his hip and stomach on the counter. He states it hurts when he lays down, when he breathes. Pt states he thinks it's bruised or a cracked ribbed. He does ice it.   . sleeping more    Pt states he doesn't feel as god as he has in the past. He feels like he's sleeping too much. He can sleep for 9 hours and take a nap at night.   . Shortness of Breath    When he's walking he's out of breath .   Marland Kitchen Appetite    Pt states that he's never really hungry but he makes hisself go ahead and eat   This visit occurred during the SARS-CoV-2 public health emergency.  Safety protocols were in place, including screening questions prior to the visit, additional usage of staff PPE, and extensive cleaning of exam room while observing appropriate contact time as indicated for disinfecting solutions.   Past Medical History-  Patient Active Problem List   Diagnosis Date Noted  . History of melanoma 05/01/2019    Priority: High  . History of central retinal artery occlusion 08/04/2017    Priority: High  . Stenosis of right internal carotid artery s/p endarterectomy 07/2017     Priority: High  . Chronic systolic CHF (congestive heart failure) ([place]) 03/21/2013    Priority: High  . Nonischemic cardiomyopathy ([place]) 09/20/2010    Priority: High  . PPM-St.Jude 11/27/2008    Priority: High  . CAD (coronary artery disease) 07/20/2008    Priority: High  . SICK SINUS SYNDROME 07/20/2008    Priority: High  . History of polymyalgia rheumatica 07/03/2008    Priority: High  . Atrial fibrillation ([place]) 03/16/2007    Priority: High  . AK (actinic keratosis) 01/28/2016    Priority: Medium  . Lipoma 07/08/2015    Priority: Medium  . Neuropathy 05/16/2015    Priority:  Medium  . Chronic cough 08/18/2013    Priority: Medium  . Pleural effusion, right 08/16/2013    Priority: Medium  . Pleural effusion on right 07/06/2012    Priority: Medium  . Hyperlipidemia 03/16/2007    Priority: Medium  . BPH (benign prostatic hyperplasia) 03/16/2007    Priority: Medium  . Thrombus of left atrial appendage without antecedent myocardial infarction 07/24/2017    Priority: Low  . Traumatic hematoma 04/01/2017    Priority: Low  . Left knee pain 10/23/2016    Priority: Low  . Prolapsed internal hemorrhoids, grade 2 - causing fecal seepage 06/10/2016    Priority: Low  . Chronic anticoagulation 02/10/2016    Priority: Low  . Left breast lump 07/15/2015    Priority: Low  . Trigger thumb of right hand 07/04/2014    Priority: Low  . Encounter for therapeutic drug monitoring 07/02/2014    Priority: Low  . Allergic rhinitis 02/14/2014    Priority: Low  . Fecal incontinence 10/18/2013    Priority: Low  . Recurrent right inguinal hernia 11/22/2012    Priority: Low  . Intention tremor 11/01/2012    Priority: Low  . MIXED HEARING LOSS BILATERAL 01/10/2008    Priority: Low  . GERD 03/16/2007    Priority: Low  . Dilated pancreatic duct-minimal and likely age appropriate  01/11/2020  . Incomplete passage of stool 04/06/2018  . Trochanteric bursitis of right hip 09/29/2017    Medications- reviewed and updated Current Outpatient Medications  Medication Sig Dispense Refill  . acetaminophen (TYLENOL) 500 MG tablet Take 1,000 mg by mouth every 6 (six) hours as needed for moderate pain or headache.    Marland Kitchen apixaban (ELIQUIS) 5 MG TABS tablet Take 1 tablet (5 mg total) by mouth 2 (two) times daily. 180 tablet 3  . dutasteride (AVODART) 0.5 MG capsule Take 0.5 mg by mouth daily.     . empagliflozin (JARDIANCE) 10 MG TABS tablet Take 1 tablet (10 mg total) by mouth daily before breakfast. 30 tablet 6  . famotidine (PEPCID) 20 MG tablet Take 20 mg by mouth 2 (two) times daily.     . fluticasone (FLONASE) 50 MCG/ACT nasal spray Place 2 sprays into both nostrils daily as needed for allergies or rhinitis. 16 g 3  . furosemide (LASIX) 80 MG tablet Take 1 tablet (80 mg total) by mouth daily. Take 80mg  (1 tablet) Daily 30 tablet 3  . hydroxypropyl methylcellulose / hypromellose (ISOPTO TEARS / GONIOVISC) 2.5 % ophthalmic solution Place 1 drop into both eyes 3 (three) times daily as needed for dry eyes.     Marland Kitchen losartan (COZAAR) 25 MG tablet Take 0.5 tablets (12.5 mg total) by mouth daily. 45 tablet 3  . metoprolol succinate (TOPROL-XL) 25 MG 24 hr tablet Take 25 mg by mouth daily.    . rosuvastatin (CRESTOR) 10 MG tablet TAKE 1 TABLET BY MOUTH EVERYDAY AT BEDTIME 90 tablet 3  . spironolactone (ALDACTONE) 25 MG tablet Take 1 tablet (25 mg total) by mouth daily. 90 tablet 3  . tamsulosin (FLOMAX) 0.4 MG CAPS capsule Take 0.4 mg by mouth 2 (two) times daily.     Marland Kitchen triamcinolone cream (KENALOG) 0.1 % Apply 1 application topically as needed.     No current facility-administered medications for this visit.     Objective:  BP (!) 92/57   Pulse 71   Temp (!) 97.2 F (36.2 C) (Temporal)   Ht 5\' 5"  (1.651 m)   Wt 154 lb 6.4 oz (70 kg)   SpO2 98%   BMI 25.69 kg/m  Gen: NAD, resting comfortably CV: RRR no murmurs rubs or gallops Lungs: CTAB no crackles, wheeze, rhonchi Does have left sided rib tenderness in pinpoint area and some mild tenderness around this Abdomen: soft/nontender/nondistended/normal bowel sounds. No rebound or guarding.  Ext: no edema Skin: warm, dry     Assessment and Plan  #Fatigue/shortness of breath/increased sleepiness/low appetite S: patient states over the last 2 months.  he simply has not felt as well as he has in the past. Finds himself more tired and feels like he is sleeping too much. He will sleep for 9 hours and still have to take a nap the next day which is not common for him. Less vigor/get up and go. Coughs when he lays down but fluticasone  has been helpful- sneezing also better  He also has noted that he feels more short of breath with activity such as walking but not with biking- did 20-30 mins this morning but didn't work so hard. Denies chest pain or palpitations. He is compliant with cardiac meds. Only addition had been jardiance. Feels thirsty  He feels like appetite is lower but he forces himself to eat. Weight up 6 lbs from February on our scales (reflecting back he had been caring for wife in final stages and  weight was likely lower). Home weights stable around 150.  Still enjoys his foods. No trouble swallowing.   History of PMR- ut denies aches as previously noted. Some back pain may call Dr. Trenton Gammon  Never diagnosed with OSA-wife who passed reported he snored. Having to use flonase more and having more PND/drainage - helps some.   Still grieving loss of wife [name] from earlier this year.  A/P: fatigue/sob/increased sleepiness. phq2 0. Start with basic workup with labs and CXR as below. Could consider sleep study if no obvious issues on labs or x-ray.   -loss of wife this year and age could contribute (hospice grief counseling as well as speaking with an old friend that hes been spending time with)   # Left sided rib pain S:Patient tripped at Dr. Neomia Dear office- was doing repair for skin graft- went to get glasses and stood up quickly and ended up lunging but hit himself on left side of ribs last Thursday. He hit the left side of his hip and stomach on the counter. Hurts to lay down and can hurt when be breathes partiularly deeply. Wonders if he bruised or cracked a rib. Icing elps.  Does ok walking or working out but hurts more laying down.  A/P: mild issues- ok to defer rib films but will get basic CXR   # persistent Atrial fibrillation S: Rate controlled with  eliquis 5 mg BID Anticoagulated with  metoprolol 25 mg XR A/P: appropriately anticoagulated and rate controlled   #Nonischemic cardiomyopathy/systolic CHF S:  Medication: jardiance 10 mg, lasix 80mg , losartan 25 mg, metoprolol 25 mg XR, spironolactone  Edema: no increased edema in legs Weight gain: no stable on home scales Shortness of breath:  has noted increase in this Orthopnea/PND: only 1 pillow  A/P: appears stable- continue current meds- not obviously fluid overloaded as cuase of symptoms   #hyperlipidemia S: Medication:rosuvastatin 10 mg  Lab Results  Component Value Date   CHOL 110 01/26/2019   HDL 42 01/26/2019   LDLCALC 43 01/26/2019   LDLDIRECT 69.0 06/15/2017   TRIG 125 01/26/2019   CHOLHDL 2.6 01/26/2019   A/P: hopefully controlled- a year since last lipids- will update with labs today. Likely continue current meds  Recommended follow up: No follow-ups on file. Future Appointments  Date Time Provider [place]  02/20/2020 11:40 AM Larey Dresser, MD [facility] None  05/07/2020  7:45 AM [facility] DEVICE REMOTES [facility]  06/28/2020 10:30 AM Narda Amber K, DO [facility] None  08/06/2020  7:45 AM [facility] DEVICE REMOTES [facility]  09/24/2020  1:30 PM [facility] CCM PHARMACIST [facility]  11/05/2020  7:45 AM [facility] DEVICE REMOTES [facility]  02/04/2021  7:45 AM [facility] DEVICE REMOTES [facility]  05/06/2021  7:45 AM [facility] DEVICE REMOTES [facility]  08/05/2021  7:45 AM [facility] DEVICE REMOTES [facility]   Lab/Order associations:   ICD-10-CM   1. Fatigue, unspecified type  R53.83 CBC With Differential/Platelet    COMPLETE METABOLIC PANEL WITH GFR    TSH  2. Dyspnea, unspecified type  R06.00 DG Chest 2 View  3. Hyperlipidemia, unspecified hyperlipidemia type  E78.5 Lipid Panel w/reflex Direct LDL    No orders of the defined types were placed in this encounter.  Return precautions advised.  Garret Reddish, MD

## 2020-02-15 LAB — CBC WITH DIFFERENTIAL/PLATELET
Absolute Monocytes: 698 cells/uL (ref 200–950)
Basophils Absolute: 53 cells/uL (ref 0–200)
Basophils Relative: 0.7 %
Eosinophils Absolute: 158 cells/uL (ref 15–500)
Eosinophils Relative: 2.1 %
HCT: 40 % (ref 38.5–50.0)
Hemoglobin: 13.9 g/dL (ref 13.2–17.1)
Lymphs Abs: 1845 cells/uL (ref 850–3900)
MCH: 32.2 pg (ref 27.0–33.0)
MCHC: 34.8 g/dL (ref 32.0–36.0)
MCV: 92.6 fL (ref 80.0–100.0)
MPV: 11.3 fL (ref 7.5–12.5)
Monocytes Relative: 9.3 %
Neutro Abs: 4748 cells/uL (ref 1500–7800)
Neutrophils Relative %: 63.3 %
Platelets: 233 10*3/uL (ref 140–400)
RBC: 4.32 10*6/uL (ref 4.20–5.80)
RDW: 12.3 % (ref 11.0–15.0)
Total Lymphocyte: 24.6 %
WBC: 7.5 10*3/uL (ref 3.8–10.8)

## 2020-02-15 LAB — LIPID PANEL W/REFLEX DIRECT LDL
Cholesterol: 107 mg/dL (ref <200)
HDL: 37 mg/dL — ABNORMAL LOW (ref >=40)
LDL Cholesterol (Calc): 49 mg/dL (calc)
Non-HDL Cholesterol (Calc): 70 mg/dL (calc) (ref <130)
Total CHOL/HDL Ratio: 2.9 (calc) (ref <5.0)
Triglycerides: 120 mg/dL (ref <150)

## 2020-02-15 LAB — COMPLETE METABOLIC PANEL WITH GFR
AG Ratio: 1.7 (calc) (ref 1.0–2.5)
ALT: 15 U/L (ref 9–46)
AST: 21 U/L (ref 10–35)
Albumin: 4.3 g/dL (ref 3.6–5.1)
Alkaline phosphatase (APISO): 62 U/L (ref 35–144)
BUN/Creatinine Ratio: 28 (calc) — ABNORMAL HIGH (ref 6–22)
BUN: 31 mg/dL — ABNORMAL HIGH (ref 7–25)
CO2: 30 mmol/L (ref 20–32)
Calcium: 9.4 mg/dL (ref 8.6–10.3)
Chloride: 100 mmol/L (ref 98–110)
Creat: 1.11 mg/dL (ref 0.70–1.11)
GFR, Est African American: 68 mL/min/{1.73_m2} (ref >=60)
GFR, Est Non African American: 59 mL/min/{1.73_m2} — ABNORMAL LOW (ref >=60)
Globulin: 2.6 g/dL (calc) (ref 1.9–3.7)
Glucose, Bld: 85 mg/dL (ref 65–99)
Potassium: 4.4 mmol/L (ref 3.5–5.3)
Sodium: 137 mmol/L (ref 135–146)
Total Bilirubin: 1.1 mg/dL (ref 0.2–1.2)
Total Protein: 6.9 g/dL (ref 6.1–8.1)

## 2020-02-15 LAB — TSH: TSH: 1.85 mIU/L (ref 0.40–4.50)

## 2020-02-20 ENCOUNTER — Other Ambulatory Visit: Payer: Self-pay

## 2020-02-20 ENCOUNTER — Ambulatory Visit (HOSPITAL_COMMUNITY)
Admission: RE | Admit: 2020-02-20 | Discharge: 2020-02-20 | Disposition: A | Discharge status: Home / Self Care | Payer: Medicare Other | Source: Ambulatory Visit | Attending: Cardiology | Admitting: Cardiology

## 2020-02-20 ENCOUNTER — Encounter (HOSPITAL_COMMUNITY): Payer: Self-pay | Admitting: Cardiology

## 2020-02-20 VITALS — BP 100/62 | HR 71 | Wt 155.2 lb

## 2020-02-20 DIAGNOSIS — Z87891 Personal history of nicotine dependence: Secondary | ICD-10-CM | POA: Diagnosis not present

## 2020-02-20 DIAGNOSIS — E785 Hyperlipidemia, unspecified: Secondary | ICD-10-CM | POA: Diagnosis not present

## 2020-02-20 DIAGNOSIS — I5022 Chronic systolic (congestive) heart failure: Secondary | ICD-10-CM | POA: Diagnosis not present

## 2020-02-20 DIAGNOSIS — Z79899 Other long term (current) drug therapy: Secondary | ICD-10-CM | POA: Insufficient documentation

## 2020-02-20 DIAGNOSIS — R0600 Dyspnea, unspecified: Secondary | ICD-10-CM | POA: Diagnosis not present

## 2020-02-20 DIAGNOSIS — I6521 Occlusion and stenosis of right carotid artery: Secondary | ICD-10-CM | POA: Insufficient documentation

## 2020-02-20 DIAGNOSIS — I2510 Atherosclerotic heart disease of native coronary artery without angina pectoris: Secondary | ICD-10-CM | POA: Insufficient documentation

## 2020-02-20 DIAGNOSIS — J90 Pleural effusion, not elsewhere classified: Secondary | ICD-10-CM | POA: Insufficient documentation

## 2020-02-20 DIAGNOSIS — Z7901 Long term (current) use of anticoagulants: Secondary | ICD-10-CM | POA: Diagnosis not present

## 2020-02-20 DIAGNOSIS — I4819 Other persistent atrial fibrillation: Secondary | ICD-10-CM | POA: Insufficient documentation

## 2020-02-20 NOTE — Progress Notes (Signed)
Medication Samples have been provided to the patient.  Drug name: Jardiance       Strength: 10        Qty: 4  LOT: [id number]   Exp.Date: 8/23  Dosing instructions: Take 10 mg  Once daily  The patient has been instructed regarding the correct time, dose, and frequency of taking this medication, including desired effects and most common side effects.   [name] 12:34 PM 02/20/2020

## 2020-02-20 NOTE — Patient Instructions (Signed)
Your physician recommends that you schedule a follow-up appointment in: 3 months  If you have any questions or concerns before your next appointment please send [place] a message through [place] or call our office at [phone].    TO LEAVE A MESSAGE FOR THE NURSE SELECT OPTION 2, PLEASE LEAVE A MESSAGE INCLUDING:  YOUR NAME  DATE OF BIRTH  CALL BACK NUMBER  REASON FOR CALL**this is important as we prioritize the call backs  YOU WILL RECEIVE A CALL BACK THE SAME DAY AS LONG AS YOU CALL BEFORE 4:00 PM  At the [facility], you and your health needs are our priority. As part of our continuing mission to provide you with exceptional heart care, we have created designated Provider Care Teams. These Care Teams include your primary Cardiologist (physician) and Advanced Practice Providers (APPs- Physician Assistants and Nurse Practitioners) who all work together to provide you with the care you need, when you need it.   You may see any of the following providers on your designated Care Team at your next follow up:  Dr Glori Bickers  Dr Haynes Kerns, NP  Lyda Jester, [place]  Audry Riles, PharmD   Please be sure to bring in all your medications bottles to every appointment.

## 2020-02-20 NOTE — Progress Notes (Signed)
ReDS Vest / Clip - 02/20/20 1200      ReDS Vest / Clip   Station Marker B    Ruler Value 36    ReDS Value Range Low volume    ReDS Actual Value 22

## 2020-02-21 NOTE — Progress Notes (Signed)
ID:  [name], DOB 12/28/1929, MRN [id number]   Provider location: [facility] Type of Visit: Established patient   PCP:  Craig Olp, MD  Cardiologist:  No primary care provider on file. Primary HF: [name]   History of Present Illness: [name] is a 86 y.o. male who has a history of paroxysmal atrial fibrillation, nonischemic cardiomyopathy, and complete heart block with St Jude CRT-P system.  He was admitted in 4/14 with pacemaker pocket infection.  His pacemaker was removed and temporary-permanent device was placed.  Later, he had the temporary-permanent device removed and a new CRT-P device was placed. He developed dyspnea post-operatively and was found to have a large right-sided pleural effusion.  He was admitted and got a chest tube for drainage. He had followup with Dr. Servando [name] and it was decided that he would not need VATS.  He had been on amiodarone for maintenance of NSR.  This had been more successful than Tikosyn.   Around 3/15, he started developing increasing exertional dyspnea.  He was short of breath walking up a hill or incline.  He could still walk on the treadmill for exercise for 10-15 minutes and use the elliptical without much trouble on most days.  No orthopnea or PND.  No chest pain.  He saw Dr [name] and was started on Lasix three times a week due to concern for volume overload.  This did not seem to have helped much.  After that, he had PFTs done which showed normal spirometry but DLCO 50% predicted.  Therefore, amiodarone was stopped due to concern for lung toxicity.  He saw [name] for a pre-operative evaluation prior to right inguinal hernia repair.  Given the exertional dyspnea, he was set up for adenosine Cardiolite in 5/15.  This showed EF 36% with a small area of primarily scar in the apex, apical lateral wall, and mid anterolateral wall.  He has not felt palpitations. Echo in 5/15 showed EF 40-45%, similar to  prior study.   I had him see pulmonary for evaluation for amiodarone lung toxicity.  CT chest looked ok, and he was not thought to have amiodarone lung toxicity.   At a prior appointment in 2016, [name] was noted to be back in atrial fibrillation.  He was more short of breath with exertion and fatigued.  He has historically tolerated atrial fibrillation poorly.  I did a TEE-guided DCCV in 5/16 back to NSR.  TEE showed EF 35-40%.  He did not immediately feel better like he has in the past post-DCCV, so Lasix was increased.    Recurrent symptomatic atrial fibrillation in 8/17 with TEE-guided DCCV to NSR.  TEE showed EF 25-30%.  Recurrent symptomatic atrial fibrillation with CHF exacerbation in 9/17.  He had repeat DCCV and Lasix was increased.   He was back in atrial fibrillation in 11/17 and appears to have been out of rhythm since then.  He was more short of breath in atrial fibrillation with a significant fall in BiV pacing percentage.  Lasix was significantly increased to try to cope with volume overload.  In 2/18, he had AV nodal ablation to promote BiV pacing.    Admitted 5/19 with right eye lower visual field loss, found to have severe right carotid stenosis. S/p R CEA on 07/26/17. Switched from coumadin to Eliquis. Volume was stable.  Echo in 5/19 showed EF 30-35%, mild AI.   Echo in 6/20 showed EF 30-35% with mildly decreased  RV systolic function.  PYP scan 11/20 was not suggestive of ATTR amyloidosis.  CPX (9/20) showed moderate functional deficit due to CHF.  Echo in 9/21 showed EF 30-35%, lateral akinesis, mildly decreased RV systolic function, moderate Craig.   Patient returns for followup of CHF.  Generally no problems walking on flat ground.  Mild dyspnea with stairs.  No orthopnea/PND.  No lightheadedness.  No chest pain.  He has generalized fatigue.    REDS clip 22%  Labs (5/19): LDL 72, HDL 41, K 4.2, creatinine 0.86 Labs (10/19): K 3.9, creatinine 0.96 Labs (12/19): K 4.4,  creatinine 0.99, LDL 63, HDl 41 Labs (3/20): K 4.4, creatinine 1.03 Labs (6/20): TSH normal, K 3.8, creatinine 0.97 Labs (9/20): Myeloma panel negative, urine immunofixation negative, K 3.7, creatinine 0.93 Labs (11/20): LDL 43 Labs (2/21): K 3.9, creatinine 1.02 Labs (5/21): K 4.1, creatinine 1.05, BNP 382 Labs (12/21): K 4.4, creatinine 1.11, hgb 13.9, LDL 49, HDL 37, TSH normal  PMH: 1. Low back pain 2. Hyperlipidemia 3. GERD 4. BPH 5. Polymyalgia rheumatica 6. Atrial fibrillation: Failed Tikosyn in the past.  Paroxysmal, on amiodarone and warfarin.  DCCV in 4/13, 7/13, 10/13.  Has held NSR with amiodarone.  However, concern for amiodarone lung toxicity: PFTs (4/15) with FVC 90% predicted, FEV1 90%, ratio 97%, DLCO 50%.   Recurrence of atrial fibrillation in 5/16, TEE-guided DCCV back to NSR in 5/16.  - Recurrence of atrial fibrillation in 8/17. TEE-guided DCCV 11/07/15.  - Recurrence of atrial fibrillation 9/17.  DCCV 11/20/15 but back in atrial fibrillation by 11/26/15.  - Amiodarone stopped due to persistent atrial fibrillation.  - AV nodal ablation in 2/18 to promote BiV pacing.  7. Transaminitis: Mild, attributed to amiodarone.  8. Complete heart block s/p St Jude CRT-P device.  Patient developed PCM pocket infection in 4/14 and had his first system removed with placement of a temporary permanent PCM.  He later had CRT-P device replaced.  This was complicated by large right-sided hemorrhagic pleural effusion requiring chest tube.   9. Nonischemic cardiomyopathy: Echo (9/13) with EF 40-45%, diffuse HK, mild Craig.  Mild CAD only on prior LHC.  Echo (4/14) with EF 45-50%.  Adenosine Cardiolite (5/15) with EF 36% (visually appeared higher per report), small area of scar with peri-infarct ischemia in the apex, apical lateral wall, and mid anterolateral wall. Echo (5/15) with EF 40-45%, basal to mid inferolateral severe hypokinesis, basal to mid anterolateral hypokinesis, mildly dilated RV with  mildly decreased RV systolic function, mild to moderate Craig.  TEE (5/16) with EF 35-40%, inferior and inferolateral severe hypokinesis, normal RV size with mildly decreased systolic function, moderate Craig, peak RV-RA gradient 26 mmHg.  - TEE (8/17): EF 25-30%, mildly dilated RV with mildly decreased systolic function, moderate biatrial enlargement, moderate central Craig.  - AV nodal ablation 2/18.  - Echo (5/19): EF 30-35%, mild AI.  - Echo (6/20): EF 30-35%, mild LV dilation, mild RV enlargement with mildly decreased systolic function, mild Craig.  - CPX (9/20): Peak VO2 14.4, VE/VCO2 48, RER 1.16 => moderate HF limitation.  - Echo (9/21): EF 30-35%, lateral akinesis, mildly decreased RV systolic function, moderate Craig.  10. Large right pleural effusion requiring chest tube following removal and reimplantation of PCM. Most recent chest CT in 6/15 showed significant decrease in size of loculated pleural effusion since 5/14.   11. Right CEA 5/19.  12. Right central retinal artery occlusion in 5/19.  13. PYP scan (11/20): Grade 1, H/CL 1.2 =>  no evidence for ATTR amyloidosis.  14. Abdominal US (7/21): No AAA.   SH: Widower, prior smoker (quit 1985), no ETOH x years, retired, lives at [facility] [facility]  [place]: Father with MI at 48, mother with AAA.   Review of systems complete and found to be negative unless listed in HPI.   Current Outpatient Medications  Medication Sig Dispense Refill  . acetaminophen (TYLENOL) 500 MG tablet Take 1,000 mg by mouth every 6 (six) hours as needed for moderate pain or headache.    Marland Kitchen apixaban (ELIQUIS) 5 MG TABS tablet Take 1 tablet (5 mg total) by mouth 2 (two) times daily. 180 tablet 3  . dutasteride (AVODART) 0.5 MG capsule Take 0.5 mg by mouth daily.     . empagliflozin (JARDIANCE) 10 MG TABS tablet Take 1 tablet (10 mg total) by mouth daily before breakfast. 30 tablet 6  . famotidine (PEPCID) 20 MG tablet Take 20 mg by mouth 2 (two) times daily.    . fluticasone (FLONASE) 50  MCG/ACT nasal spray Place 2 sprays into both nostrils daily as needed for allergies or rhinitis. 16 g 3  . furosemide (LASIX) 80 MG tablet Take 1 tablet (80 mg total) by mouth daily. Take 80mg  (1 tablet) Daily 30 tablet 3  . hydroxypropyl methylcellulose / hypromellose (ISOPTO TEARS / GONIOVISC) 2.5 % ophthalmic solution Place 1 drop into both eyes 3 (three) times daily as needed for dry eyes.     Marland Kitchen losartan (COZAAR) 25 MG tablet Take 0.5 tablets (12.5 mg total) by mouth daily. 45 tablet 3  . metoprolol succinate (TOPROL-XL) 25 MG 24 hr tablet Take 25 mg by mouth daily.    . rosuvastatin (CRESTOR) 10 MG tablet TAKE 1 TABLET BY MOUTH EVERYDAY AT BEDTIME 90 tablet 3  . spironolactone (ALDACTONE) 25 MG tablet Take 1 tablet (25 mg total) by mouth daily. 90 tablet 3  . tamsulosin (FLOMAX) 0.4 MG CAPS capsule Take 0.4 mg by mouth 2 (two) times daily.     Marland Kitchen triamcinolone cream (KENALOG) 0.1 % Apply 1 application topically as needed.     No current facility-administered medications for this encounter.    BP 100/62   Pulse 71   Wt 70.4 kg (155 lb 3.2 oz)   SpO2 95%   BMI 25.83 kg/m   Wt Readings from Last 3 Encounters:  02/20/20 70.4 kg (155 lb 3.2 oz)  02/14/20 70 kg (154 lb 6.4 oz)  01/11/20 70.9 kg (156 lb 6.4 oz)   Exam:   BP 100/62   Pulse 71   Wt 70.4 kg (155 lb 3.2 oz)   SpO2 95%   BMI 25.83 kg/m  General: NAD Neck: No JVD, no thyromegaly or thyroid nodule.  Lungs: Clear to auscultation bilaterally with normal respiratory effort. CV: Nondisplaced PMI.  Heart regular S1/S2, no S3/S4, no murmur.  No peripheral edema.  No carotid bruit.  Normal pedal pulses.  Abdomen: Soft, nontender, no hepatosplenomegaly, no distention.  Skin: Intact without lesions or rashes.  Neurologic: Alert and oriented x 3.  Psych: Normal affect. Extremities: No clubbing or cyanosis.  HEENT: Normal.   Assessment/Plan: 1. Chronic systolic CHF:  Presumed nonischemic cardiomyopathy (prior cath with only  mild CAD).  He had an adenosine Cardiolite in 5/15 with EF 36% and possible small area of apical and lateral scar with peri-infarct ischemia. He has St Jude CRT-P.  Echo in 6/20 showed EF 30-35%, echo in 9/21 with EF 30-35%. CPX (9/20) with moderate HF limitation. NYHA class II,  weight stable. Dyspnea with inclines generally.  He is not volume overloaded on exam or by REDS clip. No BP room for med titration.  - Continue Lasix 80 mg daily and Farxiga 10 mg daily.  Recent BMET stable.      - Continue Toprol XL 25 mg daily.  - Continue spironolactone 25 mg daily.  - Continue losartan 12.5 mg daily.   2. Atrial fibrillation: Now persistent.  Had breakthrough with Tikosyn and now not able to hold NSR on amiodarone. He is now off amiodarone.  He is status post AV nodal ablation with significant improvement in symptoms and BiV pacing percentage. - Continue apixiban 5 mg BID. 3. Hyperlipidemia: History of nonobstructive CAD and recent CEA.    - Continue Crestor, good lipids in 12/21.  4. Rt CEA occlusion s/p R CEA 07/26/17: He had an ulcerated, 70% stenotic plaque in the right internal carotid. Suspect this is the source of embolism for CRAO.     - Continue statin.  - Followed at [facility].   Followup in 3 months   Signed, [name], MD  02/21/2020  [facility] [address] [facility] [place] [address] [phone] (office) [phone] (fax)

## 2020-03-15 ENCOUNTER — Telehealth: Payer: Self-pay

## 2020-03-15 ENCOUNTER — Other Ambulatory Visit (HOSPITAL_COMMUNITY): Payer: Self-pay | Admitting: Cardiology

## 2020-03-15 MED ORDER — ROSUVASTATIN CALCIUM 10 MG PO TABS: ORAL_TABLET | ORAL | 3 refills | Status: DC

## 2020-03-15 MED ORDER — LOSARTAN POTASSIUM 25 MG PO TABS: 12.5000 mg | ORAL_TABLET | Freq: Every day | ORAL | 3 refills | Status: DC

## 2020-03-15 MED ORDER — FUROSEMIDE 80 MG PO TABS: 80.0000 mg | ORAL_TABLET | Freq: Every day | ORAL | 3 refills | Status: DC

## 2020-03-15 MED ORDER — EMPAGLIFLOZIN 10 MG PO TABS: 10.0000 mg | ORAL_TABLET | Freq: Every day | ORAL | 3 refills | Status: DC

## 2020-03-15 MED ORDER — METOPROLOL SUCCINATE ER 25 MG PO TB24: 25.0000 mg | ORAL_TABLET | Freq: Every day | ORAL | 3 refills | Status: DC

## 2020-03-15 MED ORDER — SPIRONOLACTONE 25 MG PO TABS: 25.0000 mg | ORAL_TABLET | Freq: Every day | ORAL | 3 refills | Status: DC

## 2020-03-15 MED ORDER — APIXABAN 5 MG PO TABS: 5.0000 mg | ORAL_TABLET | Freq: Two times a day (BID) | ORAL | 3 refills | Status: DC

## 2020-03-15 NOTE — Telephone Encounter (Signed)
Pt called to request medications sent to mail order for [facility] Advised all HF/cardiac medications will be returned to pharmacy

## 2020-03-15 NOTE — Telephone Encounter (Signed)
His Rxs have authorized by Dr. Aundra Dubin. Ok to reorder under your name?

## 2020-03-15 NOTE — Telephone Encounter (Signed)
Patient called in and wants all his medications sent to   [facility] [place]  for now on. If we could send refill in now for everything that is prescribed to the patient by Dr. Yong Channel.

## 2020-03-15 NOTE — Telephone Encounter (Signed)
I would just tell him only prescription we have his [facility] [employer] can certainly send that-he may be sending a similar message to Dr. Scarlette Shorts tell him all the other prescriptions are from Dr. Aundra Dubin

## 2020-03-18 NOTE — Telephone Encounter (Signed)
Called and lm on pt vm tcb. 

## 2020-03-19 ENCOUNTER — Other Ambulatory Visit (HOSPITAL_COMMUNITY): Payer: Self-pay | Admitting: *Deleted

## 2020-03-19 ENCOUNTER — Other Ambulatory Visit: Payer: Self-pay

## 2020-03-19 ENCOUNTER — Telehealth: Payer: Self-pay

## 2020-03-19 MED ORDER — FAMOTIDINE 20 MG PO TABS: 20.0000 mg | ORAL_TABLET | Freq: Two times a day (BID) | ORAL | 3 refills | Status: DC

## 2020-03-19 MED ORDER — SPIRONOLACTONE 25 MG PO TABS: 25.0000 mg | ORAL_TABLET | Freq: Every day | ORAL | 3 refills | Status: AC

## 2020-03-19 MED ORDER — LOSARTAN POTASSIUM 25 MG PO TABS: 12.5000 mg | ORAL_TABLET | Freq: Every day | ORAL | 3 refills | Status: AC

## 2020-03-19 MED ORDER — ROSUVASTATIN CALCIUM 10 MG PO TABS: ORAL_TABLET | ORAL | 3 refills | Status: AC

## 2020-03-19 MED ORDER — FLUTICASONE PROPIONATE 50 MCG/ACT NA SUSP: 2.0000 | Freq: Every day | NASAL | 3 refills | Status: DC | PRN

## 2020-03-19 MED ORDER — FUROSEMIDE 80 MG PO TABS: 80.0000 mg | ORAL_TABLET | Freq: Every day | ORAL | 3 refills | Status: AC

## 2020-03-19 MED ORDER — METOPROLOL SUCCINATE ER 25 MG PO TB24: 25.0000 mg | ORAL_TABLET | Freq: Every day | ORAL | 3 refills | Status: AC

## 2020-03-19 MED ORDER — EMPAGLIFLOZIN 10 MG PO TABS: 10.0000 mg | ORAL_TABLET | Freq: Every day | ORAL | 3 refills | Status: AC

## 2020-03-19 MED ORDER — APIXABAN 5 MG PO TABS: 5.0000 mg | ORAL_TABLET | Freq: Two times a day (BID) | ORAL | 3 refills | Status: AC

## 2020-03-19 NOTE — Telephone Encounter (Signed)
  LAST APPOINTMENT DATE: 03/15/2020   NEXT APPOINTMENT DATE:@3 /14/2022  MEDICATION: famotidine (PEPCID) 20 MG tablet /  fluticasone (FLONASE) 50 MCG/ACT nasal spray  [facility]ACY: [id number] [place] [place] - [address]  Comments: [facility] states [facility]    Please advise

## 2020-03-19 NOTE — Telephone Encounter (Signed)
Medication has been sent to the pharmacy. 

## 2020-03-26 ENCOUNTER — Telehealth: Payer: Self-pay

## 2020-03-26 MED ORDER — FAMOTIDINE 20 MG PO TABS
20.0000 mg | ORAL_TABLET | Freq: Two times a day (BID) | ORAL | 3 refills | Status: AC
Start: 2020-03-26 — End: ?

## 2020-03-26 MED ORDER — FLUTICASONE PROPIONATE 50 MCG/ACT NA SUSP: 2.0000 | Freq: Every day | NASAL | 3 refills | Status: AC | PRN

## 2020-03-26 NOTE — Telephone Encounter (Signed)
MEDICATION: Famotidine 20 MG, Fluticasone 50 mcg  PHARMACY: [facility]   Comments: Fax # is [phone]  **Let patient know to contact pharmacy at the end of the day to make sure medication is ready. **  ** Please notify patient to allow 48-72 hours to process**  **Encourage patient to contact the pharmacy for refills or they can request refills through [facility]**

## 2020-03-26 NOTE — Telephone Encounter (Signed)
Rx sent in

## 2020-04-04 ENCOUNTER — Telehealth (INDEPENDENT_AMBULATORY_CARE_PROVIDER_SITE_OTHER): Payer: Medicare Other | Admitting: Family Medicine

## 2020-04-04 DIAGNOSIS — R0981 Nasal congestion: Secondary | ICD-10-CM

## 2020-04-04 NOTE — Progress Notes (Signed)
Virtual Visit via Video Note  I connected with [name]  on 04/04/20 at 10:00 AM EST by a video enabled telemedicine application and verified that I am speaking with the correct person using two identifiers.  Location patient: home, [place] Location provider:work or home office Persons participating in the virtual visit: patient, provider  I discussed the limitations of evaluation and management by telemedicine and the availability of in person appointments. The patient expressed understanding and agreed to proceed.   HPI:  Acute telemedicine visit for Sinus Issues: -Symptoms include: has had persistent nasal congestion, runny nose - mainly after eating meals or spicy food, mostly clear sinus drainage -had a cold a about 3 weeks ago - now back baseline -reports has chronic issues with nasal congestion - takes flonase -sometimes gets nose bleeds -Denies:fevers, CP, SOB, body ache, cough, malaise -Has tried:flonase in the morning and nasal saline -Pertinent past medical history:see below -Pertinent medication allergies: antihistamines -COVID-19 vaccine status: fully vaccinated for covid19  ROS: See pertinent positives and negatives per HPI.  Past Medical History:  Diagnosis Date  . Arthritis    "back" (03/20/2014)  . Atrial fibrillation ([place])   . BPH (benign prostatic hypertrophy)   . Breast mass    "on both sides" (06/28/2012)  . Cardiomyopathy primary-nonischemic EF 45%  . Carotid artery occlusion   . CHF (congestive heart failure) ([place])   . Coronary artery disease   . Diverticulosis of colon without hemorrhage 01/03/2014  . Dysphagia   . Elevated liver enzymes   . Esophageal stricture   . GERD (gastroesophageal reflux disease)   . Hearing loss, mixed, bilateral   . Hiatal hernia   . Hx of cardiovascular stress test    Adenosine Myoview (07/2013):  Apical cap, apical lateral and mid anterolateral scar, small amount of peri-infarct ischemia, EF 36%; Medium Risk  . Hyperlipidemia    . Increased prostate specific antigen (PSA) velocity   . Internal hemorrhoids 01/03/2014  . Lumbar back pain   . Pacemaker    st jude  . Pacemaker infection ([place])    06/28/12  . Polymyalgia rheumatica ([place])   . Sick sinus syndrome [facility])     Past Surgical History:  Procedure Laterality Date  . AV NODE ABLATION N/A 04/20/2016   Procedure: AV Node Ablation;  Surgeon: Evans Lance, MD;  Location: [place] CV LAB;  Service: Cardiovascular;  Laterality: N/A;  . BASAL CELL CARCINOMA EXCISION  2021   ear  . CARDIAC CATHETERIZATION  1980's   [place]  . CARDIOVERSION  06/17/2011   Procedure: CARDIOVERSION;  Surgeon: Lelon Perla, MD;  Location: [place];  Service: Cardiovascular;  Laterality: N/A;  . CARDIOVERSION  09/09/2011   Procedure: CARDIOVERSION;  Surgeon: Carlena Bjornstad, MD;  Location: [place];  Service: Cardiovascular;  Laterality: N/A;  . CARDIOVERSION  01/01/2012   Procedure: CARDIOVERSION;  Surgeon: Hillary Bow, MD;  Location: [facility] ENDOSCOPY;  Service: Cardiovascular;  Laterality: N/A;  . CARDIOVERSION N/A 08/03/2014   Procedure: CARDIOVERSION;  Surgeon: Larey Dresser, MD;  Location: [facility] ENDOSCOPY;  Service: Cardiovascular;  Laterality: N/A;  . CARDIOVERSION N/A 11/07/2015   Procedure: CARDIOVERSION;  Surgeon: Larey Dresser, MD;  Location: [facility] ENDOSCOPY;  Service: Cardiovascular;  Laterality: N/A;  . CARDIOVERSION N/A 11/20/2015   Procedure: CARDIOVERSION;  Surgeon: Larey Dresser, MD;  Location: [place];  Service: Cardiovascular;  Laterality: N/A;  . CAROTID ENDARTERECTOMY    . CATARACT EXTRACTION, BILATERAL Bilateral 1980's  . COLONOSCOPY N/A 01/03/2014  Procedure: COLONOSCOPY;  Surgeon: Gatha Mayer, MD;  Location: [facility] ENDOSCOPY;  Service: Endoscopy;  Laterality: N/A;  . ENDARTERECTOMY Right 07/26/2017   Procedure: RIGHT CAROTID  ARTERY ENDARTERECTOMY;  Surgeon: Rosetta Posner, MD;  Location: [place] OR;  Service: Vascular;  Laterality: Right;  .  ESOPHAGOGASTRODUODENOSCOPY    . GENERATOR REMOVAL Left 06/15/2012   Procedure: GENERATOR REMOVAL;  Surgeon: Evans Lance, MD;  Location: [place];  Service: Cardiovascular;  Laterality: Left;  . HEMORRHOID BANDING    . INGUINAL HERNIA REPAIR Right 2012; 03/20/2014  . INGUINAL HERNIA REPAIR Right 03/20/2014   Procedure: OPEN REPAIR OF RIGHT INGUINAL WITH MESH;  Surgeon: Donnie Mesa, MD;  Location: [place];  Service: General;  Laterality: Right;  . INSERT / REPLACE / REMOVE PACEMAKER    . INSERTION OF MESH Right 03/20/2014   Procedure: INSERTION OF MESH;  Surgeon: Donnie Mesa, MD;  Location: [place];  Service: General;  Laterality: Right;  . KNEE ARTHROSCOPY WITH LATERAL MENISECTOMY Left 12/16/2016   Procedure: KNEE ARTHROSCOPY WITH LATERAL MENISECTOMY and Chondroplasty;  Surgeon: Earlie Server, MD;  Location: [place];  Service: Orthopedics;  Laterality: Left;  . LEAD REVISION N/A 06/29/2012   Procedure: LEAD REVISION;  Surgeon: Deboraha Sprang, MD;  Location: [facility] CATH LAB;  Service: Cardiovascular;  Laterality: N/A;  . MELANOMA EXCISION Right 2020   arm  . PACEMAKER GENERATOR CHANGE N/A 06/15/2012   Procedure: PACEMAKER GENERATOR CHANGE;  Surgeon: Evans Lance, MD;  Location: [place];  Service: Cardiovascular;  Laterality: N/A;  . PACEMAKER REVISION  06/30/2012   Procedure: PACEMAKER LEAD REVISION;  Surgeon: Evans Lance, MD;  Location: [facility] CATH LAB;  Service: Cardiovascular;;  . PARACENTESIS  ~ 0/9326   complication from pacer change in 2014  . PATCH ANGIOPLASTY Right 07/26/2017   Procedure: WITH PATCH ANGIOPLASTY;  Surgeon: Rosetta Posner, MD;  Location: [place];  Service: Vascular;  Laterality: Right;  . PERMANENT PACEMAKER INSERTION N/A 06/28/2012   Procedure: PERMANENT PACEMAKER INSERTION;  Surgeon: Evans Lance, MD;  Location: [facility] CATH LAB;  Service: Cardiovascular;  Laterality: N/A;  . TEE WITHOUT CARDIOVERSION  01/01/2012   Procedure: TRANSESOPHAGEAL ECHOCARDIOGRAM (TEE);  Surgeon: Fay Records,  MD;  Location: [facility] ENDOSCOPY;  Service: Cardiovascular;  Laterality: N/A;  . TEE WITHOUT CARDIOVERSION N/A 08/03/2014   Procedure: TRANSESOPHAGEAL ECHOCARDIOGRAM (TEE);  Surgeon: Larey Dresser, MD;  Location: [place];  Service: Cardiovascular;  Laterality: N/A;  . TEE WITHOUT CARDIOVERSION N/A 11/07/2015   Procedure: TRANSESOPHAGEAL ECHOCARDIOGRAM (TEE);  Surgeon: Larey Dresser, MD;  Location: [place];  Service: Cardiovascular;  Laterality: N/A;  . TONSILLECTOMY AND ADENOIDECTOMY  1938  . UMBILICAL HERNIA REPAIR  [phone] X 3     Current Outpatient Medications:  .  acetaminophen (TYLENOL) 500 MG tablet, Take 1,000 mg by mouth every 6 (six) hours as needed for moderate pain or headache., Disp: , Rfl:  .  apixaban (ELIQUIS) 5 MG TABS tablet, Take 1 tablet (5 mg total) by mouth 2 (two) times daily., Disp: 180 tablet, Rfl: 3 .  dutasteride (AVODART) 0.5 MG capsule, Take 0.5 mg by mouth daily. , Disp: , Rfl:  .  empagliflozin (JARDIANCE) 10 MG TABS tablet, Take 1 tablet (10 mg total) by mouth daily before breakfast., Disp: 90 tablet, Rfl: 3 .  famotidine (PEPCID) 20 MG tablet, Take 1 tablet (20 mg total) by mouth 2 (two) times daily., Disp: 180 tablet, Rfl: 3 .  fluticasone (FLONASE) 50 MCG/ACT nasal spray,  Place 2 sprays into both nostrils daily as needed for allergies or rhinitis., Disp: 16 g, Rfl: 3 .  furosemide (LASIX) 80 MG tablet, Take 1 tablet (80 mg total) by mouth daily., Disp: 90 tablet, Rfl: 3 .  hydroxypropyl methylcellulose / hypromellose (ISOPTO TEARS / GONIOVISC) 2.5 % ophthalmic solution, Place 1 drop into both eyes 3 (three) times daily as needed for dry eyes. , Disp: , Rfl:  .  losartan (COZAAR) 25 MG tablet, Take 0.5 tablets (12.5 mg total) by mouth daily., Disp: 45 tablet, Rfl: 3 .  metoprolol succinate (TOPROL-XL) 25 MG 24 hr tablet, Take 1 tablet (25 mg total) by mouth daily., Disp: 90 tablet, Rfl: 3 .  rosuvastatin (CRESTOR) 10 MG tablet, TAKE 1 TABLET BY MOUTH  EVERYDAY AT BEDTIME, Disp: 90 tablet, Rfl: 3 .  spironolactone (ALDACTONE) 25 MG tablet, Take 1 tablet (25 mg total) by mouth daily., Disp: 90 tablet, Rfl: 3 .  tamsulosin (FLOMAX) 0.4 MG CAPS capsule, Take 0.4 mg by mouth 2 (two) times daily. , Disp: , Rfl:  .  triamcinolone cream (KENALOG) 0.1 %, Apply 1 application topically as needed., Disp: , Rfl:   EXAM:  VITALS per patient if applicable:  GENERAL: alert, oriented, appears well and in no acute distress  HEENT: atraumatic, conjunttiva clear, no obvious abnormalities on inspection of external nose and ears  NECK: normal movements of the head and neck  LUNGS: on inspection no signs of respiratory distress, breathing rate appears normal, no obvious gross SOB, gasping or wheezing  CV: no obvious cyanosis  MS: moves all visible extremities without noticeable abnormality  PSYCH/NEURO: pleasant and cooperative, no obvious depression or anxiety, speech and thought processing grossly intact  ASSESSMENT AND PLAN:  Discussed the following assessment and plan:  Nasal congestion  -we discussed possible serious and likely etiologies, options for evaluation and workup, limitations of telemedicine visit vs in person visit, treatment, treatment risks and precautions. Pt prefers to treat via telemedicine empirically rather than in person at this moment. Discussed how temperature of food and spice can cause rhinorrhea. Also, discussed proper use of flonase (spraying away from septum and taking a 3 day break if nose bleeds) to help with the occasional blood in the nasal drainage and nose bleed management. Discussed options for management of possible allergic rhinitis as well.   Advised to seek prompt in person care if worsening, new symptoms arise, or if is not improving with treatment. Discussed options for inperson care if PCP office not available. Did let this patient know that I only do telemedicine on Tuesdays and Thursdays for [place]. Advised  to schedule follow up visit with PCP or [facility] if any further questions or concerns to avoid delays in care.   I discussed the assessment and treatment plan with the patient. The patient was provided an opportunity to ask questions and all were answered. The patient agreed with the plan and demonstrated an understanding of the instructions.     Lucretia Kern, DO

## 2020-04-09 ENCOUNTER — Telehealth (HOSPITAL_COMMUNITY): Payer: Self-pay | Admitting: Pharmacy Technician

## 2020-04-09 NOTE — Telephone Encounter (Signed)
Sent in [employer] C[employer] application via fax.  Will follow up.   

## 2020-04-29 ENCOUNTER — Telehealth: Payer: Self-pay

## 2020-04-29 NOTE — Chronic Care Management (AMB) (Signed)
Chronic Care Management Pharmacy Assistant   Name: [name]  MRN: [id number] DOB: Jul 11, 1929  Reason for Encounter: Patient Assistance  PCP : [name], MD  Allergies:   Allergies  Allergen Reactions  . Antihistamines, Diphenhydramine-Type Other (See Comments)    Causes difficulty in ability to urinate.    Medications: Outpatient Encounter Medications as of 04/29/2020  Medication Sig  . acetaminophen (TYLENOL) 500 MG tablet Take 1,000 mg by mouth every 6 (six) hours as needed for moderate pain or headache.  Marland Kitchen apixaban (ELIQUIS) 5 MG TABS tablet Take 1 tablet (5 mg total) by mouth 2 (two) times daily.  Marland Kitchen dutasteride (AVODART) 0.5 MG capsule Take 0.5 mg by mouth daily.   . empagliflozin (JARDIANCE) 10 MG TABS tablet Take 1 tablet (10 mg total) by mouth daily before breakfast.  . famotidine (PEPCID) 20 MG tablet Take 1 tablet (20 mg total) by mouth 2 (two) times daily.  . fluticasone (FLONASE) 50 MCG/ACT nasal spray Place 2 sprays into both nostrils daily as needed for allergies or rhinitis.  . furosemide (LASIX) 80 MG tablet Take 1 tablet (80 mg total) by mouth daily.  . hydroxypropyl methylcellulose / hypromellose (ISOPTO TEARS / GONIOVISC) 2.5 % ophthalmic solution Place 1 drop into both eyes 3 (three) times daily as needed for dry eyes.   Marland Kitchen losartan (COZAAR) 25 MG tablet Take 0.5 tablets (12.5 mg total) by mouth daily.  . metoprolol succinate (TOPROL-XL) 25 MG 24 hr tablet Take 1 tablet (25 mg total) by mouth daily.  . rosuvastatin (CRESTOR) 10 MG tablet TAKE 1 TABLET BY MOUTH EVERYDAY AT BEDTIME  . spironolactone (ALDACTONE) 25 MG tablet Take 1 tablet (25 mg total) by mouth daily.  . tamsulosin (FLOMAX) 0.4 MG CAPS capsule Take 0.4 mg by mouth 2 (two) times daily.   Marland Kitchen triamcinolone cream (KENALOG) 0.1 % Apply 1 application topically as needed.   No facility-administered encounter medications on file as of 04/29/2020.    Current Diagnosis: Patient Active  Problem List   Diagnosis Date Noted  . Dilated pancreatic duct-minimal and likely age appropriate 01/11/2020  . History of melanoma 05/01/2019  . Incomplete passage of stool 04/06/2018  . Trochanteric bursitis of right hip 09/29/2017  . History of central retinal artery occlusion 08/04/2017  . Stenosis of right internal carotid artery s/p endarterectomy 07/2017   . Thrombus of left atrial appendage without antecedent myocardial infarction 07/24/2017  . Traumatic hematoma 04/01/2017  . Left knee pain 10/23/2016  . Prolapsed internal hemorrhoids, grade 2 - causing fecal seepage 06/10/2016  . Chronic anticoagulation 02/10/2016  . AK (actinic keratosis) 01/28/2016  . Left breast lump 07/15/2015  . Lipoma 07/08/2015  . Neuropathy 05/16/2015  . Trigger thumb of right hand 07/04/2014  . Encounter for therapeutic drug monitoring 07/02/2014  . Allergic rhinitis 02/14/2014  . Fecal incontinence 10/18/2013  . Chronic cough 08/18/2013  . Pleural effusion, right 08/16/2013  . Chronic systolic CHF (congestive heart failure) ([place]) 03/21/2013  . Recurrent right inguinal hernia 11/22/2012  . Intention tremor 11/01/2012  . Pleural effusion on right 07/06/2012  . Nonischemic cardiomyopathy ([place]) 09/20/2010  . PPM-St.Jude 11/27/2008  . CAD (coronary artery disease) 07/20/2008  . SICK SINUS SYNDROME 07/20/2008  . History of polymyalgia rheumatica 07/03/2008  . MIXED HEARING LOSS BILATERAL 01/10/2008  . Hyperlipidemia 03/16/2007  . Atrial fibrillation ([place]) 03/16/2007  . GERD 03/16/2007  . BPH (benign prostatic hyperplasia) 03/16/2007    Patient Assistance application for Jardiance denied through [facility]  Cares Patient Assistance Program due to income over limit.  Patient aware and verbally understands.  April D Calhoun, [place] Pharmacist Assistant [phone]   Follow-Up:  Patient Assistance Coordination

## 2020-05-01 DIAGNOSIS — I4901 Ventricular fibrillation: Secondary | ICD-10-CM | POA: Diagnosis not present

## 2020-05-01 DIAGNOSIS — R1111 Vomiting without nausea: Secondary | ICD-10-CM | POA: Diagnosis not present

## 2020-05-01 DIAGNOSIS — R404 Transient alteration of awareness: Secondary | ICD-10-CM | POA: Diagnosis not present

## 2020-05-01 DIAGNOSIS — R0689 Other abnormalities of breathing: Secondary | ICD-10-CM | POA: Diagnosis not present

## 2020-05-02 ENCOUNTER — Telehealth: Payer: Self-pay

## 2020-05-02 NOTE — Telephone Encounter (Signed)
Please review and sign

## 2020-05-02 NOTE — Telephone Encounter (Signed)
Patient needs to be marked as deceased in epic. Tammy- is this on admin side?   [place] team- I have no information on cause of deaht- I have not seen him in 3 months and cannot see his obvious cause of decline- he reported some worsened fatigue 3 months ago but workup was rather reassuring. Can they provide info or have family member with info- I dont see contacts in the chart after passing of wife [name]?  I want to make sure natural cause of death. I am assuming CHF as cause but would be helpful to have more info around days leading up to death.

## 2020-05-02 NOTE — Telephone Encounter (Signed)
[facility] called to let us know that pt passed, yesterday 2/23 at 1:33 pm. Case number in [place] is [id number]. Funeral home cannot go forth in cremation til death certificate is signed by Physician. They are requesting Dr. Yong Channel sign this as soon as he can.

## 2020-05-03 NOTE — Telephone Encounter (Signed)
Called the home number and spoke with pt son and he states Pt was walking back from lunch and collapsed while walking home. EMS was called and pt was unresponsive when they got to the scene, pt son state he thinks they did try shocking the pt but was unsuccessful. I gave pt our condolences and told him to let us know if he needs anything from [place].

## 2020-05-04 NOTE — Telephone Encounter (Signed)
I personally changed patient's record to deceased status  I called [facility] and spoke to their healthcare division-patient's information has already been deleted so there is no record of what occurred.  Patient had coronary artery disease and CHF which likely led to sudden death coronary artery disease/myocardial infarction-this was listed on death certificate.  I tried to call son but was unable to reach him

## 2020-05-06 ENCOUNTER — Telehealth: Payer: Self-pay

## 2020-05-06 NOTE — Telephone Encounter (Signed)
The RN supervisor returned the call and stated that Dr. Yong Channel was needing additional information from [facility], I advised Collie Siad that you were not in office today but will be back tomorrow and would follow up then. Sue's direct [phone].

## 2020-05-06 NOTE — Telephone Encounter (Signed)
Returned call to the number listed and was told that [name] only works on [employer] and the person that answered the phone was not aware of the situation and states she would call me back.

## 2020-05-06 NOTE — Telephone Encounter (Signed)
Caller returning a call to Dr. Yong Channel about a death certificate for [name].

## 2020-05-07 DIAGNOSIS — 419620001 Death: Secondary | SNOMED CT | POA: Diagnosis not present

## 2020-05-07 NOTE — Telephone Encounter (Signed)
I attempted to call-they did not pick up.  Unless there is new information available I do not think we need to chat further

## 2020-05-07 DEATH — deceased

## 2020-05-13 NOTE — Telephone Encounter (Signed)
Let me know if there is anything else I can do.    In order for myself to mark a chart I have to pull an obit, death cert, or other documentation and send to med rec to mark.

## 2020-05-20 ENCOUNTER — Ambulatory Visit: Payer: Medicare Other | Admitting: Family Medicine

## 2020-05-28 ENCOUNTER — Encounter (HOSPITAL_COMMUNITY): Payer: Medicare Other | Admitting: Cardiology

## 2020-06-14 ENCOUNTER — Other Ambulatory Visit: Payer: Self-pay | Admitting: Family Medicine

## 2020-06-28 ENCOUNTER — Ambulatory Visit: Payer: Medicare Other | Admitting: Neurology

## 2020-07-07 DEATH — deceased

## 2020-09-24 ENCOUNTER — Telehealth: Payer: Medicare Other
